# Patient Record
Sex: Male | Born: 1955 | Race: Black or African American | Hispanic: No | Marital: Single | Smoking: Current every day smoker
Health system: Southern US, Community
[De-identification: ages and names within clinical notes are randomized; demographics above are authoritative.]

## PROBLEM LIST (undated history)

## (undated) DIAGNOSIS — G4700 Insomnia, unspecified: Secondary | ICD-10-CM

## (undated) DIAGNOSIS — K219 Gastro-esophageal reflux disease without esophagitis: Secondary | ICD-10-CM

## (undated) DIAGNOSIS — Z22322 Carrier or suspected carrier of Methicillin resistant Staphylococcus aureus: Secondary | ICD-10-CM

## (undated) DIAGNOSIS — R51 Headache: Secondary | ICD-10-CM

## (undated) DIAGNOSIS — M1990 Unspecified osteoarthritis, unspecified site: Secondary | ICD-10-CM

## (undated) DIAGNOSIS — K5900 Constipation, unspecified: Secondary | ICD-10-CM

## (undated) DIAGNOSIS — Z9289 Personal history of other medical treatment: Secondary | ICD-10-CM

## (undated) DIAGNOSIS — I10 Essential (primary) hypertension: Secondary | ICD-10-CM

## (undated) DIAGNOSIS — R0600 Dyspnea, unspecified: Secondary | ICD-10-CM

## (undated) DIAGNOSIS — B370 Candidal stomatitis: Secondary | ICD-10-CM

## (undated) DIAGNOSIS — D61818 Other pancytopenia: Secondary | ICD-10-CM

## (undated) DIAGNOSIS — D539 Nutritional anemia, unspecified: Secondary | ICD-10-CM

## (undated) DIAGNOSIS — J449 Chronic obstructive pulmonary disease, unspecified: Secondary | ICD-10-CM

## (undated) DIAGNOSIS — C099 Malignant neoplasm of tonsil, unspecified: Secondary | ICD-10-CM

## (undated) DIAGNOSIS — R590 Localized enlarged lymph nodes: Secondary | ICD-10-CM

## (undated) DIAGNOSIS — C760 Malignant neoplasm of head, face and neck: Secondary | ICD-10-CM

## (undated) DIAGNOSIS — D649 Anemia, unspecified: Secondary | ICD-10-CM

## (undated) DIAGNOSIS — Z931 Gastrostomy status: Secondary | ICD-10-CM

## (undated) DIAGNOSIS — R569 Unspecified convulsions: Secondary | ICD-10-CM

## (undated) DIAGNOSIS — Z923 Personal history of irradiation: Secondary | ICD-10-CM

## (undated) DIAGNOSIS — R3911 Hesitancy of micturition: Secondary | ICD-10-CM

## (undated) DIAGNOSIS — M549 Dorsalgia, unspecified: Secondary | ICD-10-CM

## (undated) DIAGNOSIS — G8929 Other chronic pain: Secondary | ICD-10-CM

## (undated) DIAGNOSIS — K861 Other chronic pancreatitis: Secondary | ICD-10-CM

## (undated) DIAGNOSIS — I878 Other specified disorders of veins: Secondary | ICD-10-CM

## (undated) HISTORY — DX: Nutritional anemia, unspecified: D53.9

## (undated) HISTORY — DX: Unspecified osteoarthritis, unspecified site: M19.90

## (undated) HISTORY — DX: Other specified disorders of veins: I87.8

## (undated) HISTORY — PX: TIBIA FRACTURE SURGERY: SHX806

## (undated) HISTORY — PX: BILE DUCT STENT PLACEMENT: SHX1227

## (undated) HISTORY — DX: Anemia, unspecified: D64.9

## (undated) HISTORY — DX: Unspecified convulsions: R56.9

## (undated) HISTORY — DX: Personal history of irradiation: Z92.3

## (undated) HISTORY — DX: Candidal stomatitis: B37.0

## (undated) HISTORY — DX: Localized enlarged lymph nodes: R59.0

## (undated) HISTORY — DX: Other chronic pancreatitis: K86.1

---

## 2005-04-03 HISTORY — PX: CHOLECYSTECTOMY: SHX55

## 2008-06-17 ENCOUNTER — Inpatient Hospital Stay (HOSPITAL_COMMUNITY): Admission: EM | Admit: 2008-06-17 | Discharge: 2008-06-21 | Payer: Self-pay | Admitting: Emergency Medicine

## 2008-06-18 ENCOUNTER — Encounter: Payer: Self-pay | Admitting: Internal Medicine

## 2008-06-20 ENCOUNTER — Encounter (INDEPENDENT_AMBULATORY_CARE_PROVIDER_SITE_OTHER): Payer: Self-pay | Admitting: Gastroenterology

## 2008-07-01 ENCOUNTER — Emergency Department (HOSPITAL_COMMUNITY): Admission: EM | Admit: 2008-07-01 | Discharge: 2008-07-02 | Payer: Self-pay | Admitting: Emergency Medicine

## 2008-07-15 ENCOUNTER — Emergency Department (HOSPITAL_COMMUNITY): Admission: EM | Admit: 2008-07-15 | Discharge: 2008-07-15 | Payer: Self-pay | Admitting: Emergency Medicine

## 2008-07-24 ENCOUNTER — Inpatient Hospital Stay (HOSPITAL_COMMUNITY): Admission: EM | Admit: 2008-07-24 | Discharge: 2008-07-27 | Payer: Self-pay | Admitting: Emergency Medicine

## 2008-08-13 ENCOUNTER — Emergency Department (HOSPITAL_COMMUNITY): Admission: EM | Admit: 2008-08-13 | Discharge: 2008-08-13 | Payer: Self-pay | Admitting: Emergency Medicine

## 2008-09-18 ENCOUNTER — Emergency Department (HOSPITAL_COMMUNITY): Admission: EM | Admit: 2008-09-18 | Discharge: 2008-09-18 | Payer: Self-pay | Admitting: Emergency Medicine

## 2008-09-25 ENCOUNTER — Emergency Department (HOSPITAL_COMMUNITY): Admission: EM | Admit: 2008-09-25 | Discharge: 2008-09-25 | Payer: Self-pay | Admitting: Emergency Medicine

## 2008-10-17 ENCOUNTER — Encounter (INDEPENDENT_AMBULATORY_CARE_PROVIDER_SITE_OTHER): Payer: Self-pay | Admitting: Gastroenterology

## 2008-10-17 ENCOUNTER — Ambulatory Visit (HOSPITAL_COMMUNITY): Admission: RE | Admit: 2008-10-17 | Discharge: 2008-10-17 | Payer: Self-pay | Admitting: Gastroenterology

## 2008-10-18 ENCOUNTER — Ambulatory Visit (HOSPITAL_COMMUNITY): Admission: RE | Admit: 2008-10-18 | Discharge: 2008-10-18 | Payer: Self-pay | Admitting: Gastroenterology

## 2009-03-08 ENCOUNTER — Emergency Department (HOSPITAL_COMMUNITY): Admission: EM | Admit: 2009-03-08 | Discharge: 2009-03-09 | Payer: Self-pay | Admitting: Emergency Medicine

## 2009-03-28 ENCOUNTER — Inpatient Hospital Stay (HOSPITAL_COMMUNITY): Admission: EM | Admit: 2009-03-28 | Discharge: 2009-03-31 | Payer: Self-pay | Admitting: Emergency Medicine

## 2009-05-11 ENCOUNTER — Emergency Department (HOSPITAL_COMMUNITY): Admission: EM | Admit: 2009-05-11 | Discharge: 2009-05-11 | Payer: Self-pay | Admitting: Emergency Medicine

## 2009-05-15 ENCOUNTER — Emergency Department (HOSPITAL_COMMUNITY): Admission: EM | Admit: 2009-05-15 | Discharge: 2009-05-15 | Payer: Self-pay | Admitting: Emergency Medicine

## 2009-05-29 ENCOUNTER — Ambulatory Visit (HOSPITAL_COMMUNITY): Admission: RE | Admit: 2009-05-29 | Discharge: 2009-05-29 | Payer: Self-pay | Admitting: Gastroenterology

## 2009-05-29 ENCOUNTER — Encounter (INDEPENDENT_AMBULATORY_CARE_PROVIDER_SITE_OTHER): Payer: Self-pay | Admitting: Gastroenterology

## 2009-06-18 ENCOUNTER — Emergency Department (HOSPITAL_COMMUNITY): Admission: EM | Admit: 2009-06-18 | Discharge: 2009-06-18 | Payer: Self-pay | Admitting: Emergency Medicine

## 2009-07-11 ENCOUNTER — Emergency Department (HOSPITAL_COMMUNITY): Admission: EM | Admit: 2009-07-11 | Discharge: 2009-07-12 | Payer: Self-pay | Admitting: Emergency Medicine

## 2009-08-20 ENCOUNTER — Emergency Department (HOSPITAL_COMMUNITY): Admission: EM | Admit: 2009-08-20 | Discharge: 2009-08-20 | Payer: Self-pay | Admitting: Emergency Medicine

## 2009-09-04 ENCOUNTER — Ambulatory Visit (HOSPITAL_COMMUNITY): Admission: RE | Admit: 2009-09-04 | Discharge: 2009-09-04 | Payer: Self-pay | Admitting: Gastroenterology

## 2009-12-10 ENCOUNTER — Inpatient Hospital Stay (HOSPITAL_COMMUNITY): Admission: EM | Admit: 2009-12-10 | Discharge: 2009-12-14 | Payer: Self-pay | Admitting: Emergency Medicine

## 2010-02-20 ENCOUNTER — Emergency Department (HOSPITAL_COMMUNITY): Admission: EM | Admit: 2010-02-20 | Discharge: 2010-02-21 | Payer: Self-pay | Admitting: Emergency Medicine

## 2010-03-02 ENCOUNTER — Emergency Department (HOSPITAL_COMMUNITY): Admission: EM | Admit: 2010-03-02 | Discharge: 2010-03-03 | Payer: Self-pay | Admitting: Emergency Medicine

## 2010-03-19 ENCOUNTER — Emergency Department (HOSPITAL_COMMUNITY): Admission: EM | Admit: 2010-03-19 | Discharge: 2010-03-19 | Payer: Self-pay | Admitting: Emergency Medicine

## 2010-03-23 ENCOUNTER — Emergency Department (HOSPITAL_COMMUNITY): Admission: EM | Admit: 2010-03-23 | Discharge: 2010-03-23 | Payer: Self-pay | Admitting: Emergency Medicine

## 2010-04-21 ENCOUNTER — Emergency Department (HOSPITAL_COMMUNITY): Admission: EM | Admit: 2010-04-21 | Discharge: 2010-04-21 | Payer: Self-pay | Admitting: Emergency Medicine

## 2010-04-23 ENCOUNTER — Emergency Department (HOSPITAL_COMMUNITY): Admission: EM | Admit: 2010-04-23 | Discharge: 2010-04-23 | Payer: Self-pay | Admitting: Emergency Medicine

## 2010-05-18 ENCOUNTER — Emergency Department (HOSPITAL_COMMUNITY): Admission: EM | Admit: 2010-05-18 | Discharge: 2010-05-18 | Payer: Self-pay | Admitting: Emergency Medicine

## 2010-07-01 ENCOUNTER — Emergency Department (HOSPITAL_COMMUNITY): Admission: EM | Admit: 2010-07-01 | Discharge: 2010-07-01 | Payer: Self-pay | Admitting: Emergency Medicine

## 2010-07-03 ENCOUNTER — Emergency Department (HOSPITAL_COMMUNITY): Admission: EM | Admit: 2010-07-03 | Discharge: 2010-07-03 | Payer: Self-pay | Admitting: Emergency Medicine

## 2010-08-12 ENCOUNTER — Emergency Department (HOSPITAL_COMMUNITY): Admission: EM | Admit: 2010-08-12 | Discharge: 2010-08-12 | Payer: Self-pay | Admitting: Emergency Medicine

## 2010-10-13 ENCOUNTER — Emergency Department (HOSPITAL_COMMUNITY)
Admission: EM | Admit: 2010-10-13 | Discharge: 2010-10-14 | Payer: Self-pay | Source: Home / Self Care | Admitting: Emergency Medicine

## 2010-10-19 LAB — HEPATIC FUNCTION PANEL
ALT: 21 U/L (ref 0–53)
AST: 29 U/L (ref 0–37)
Albumin: 4.2 g/dL (ref 3.5–5.2)
Alkaline Phosphatase: 84 U/L (ref 39–117)
Bilirubin, Direct: 0.1 mg/dL (ref 0.0–0.3)
Indirect Bilirubin: 0.3 mg/dL (ref 0.3–0.9)
Total Bilirubin: 0.4 mg/dL (ref 0.3–1.2)
Total Protein: 7.8 g/dL (ref 6.0–8.3)

## 2010-10-19 LAB — BASIC METABOLIC PANEL
BUN: 9 mg/dL (ref 6–23)
CO2: 29 mEq/L (ref 19–32)
Calcium: 9.2 mg/dL (ref 8.4–10.5)
Chloride: 102 mEq/L (ref 96–112)
Creatinine, Ser: 0.84 mg/dL (ref 0.4–1.5)
GFR calc Af Amer: >60 mL/min (ref >60)
GFR calc non Af Amer: >60 mL/min (ref >60)
Glucose, Bld: 89 mg/dL (ref 70–99)
Potassium: 4.1 mEq/L (ref 3.5–5.1)
Sodium: 138 mEq/L (ref 135–145)

## 2010-10-19 LAB — URINALYSIS, ROUTINE W REFLEX MICROSCOPIC
Bilirubin Urine: NEGATIVE
Hgb urine dipstick: NEGATIVE
Ketones, ur: NEGATIVE mg/dL
Nitrite: NEGATIVE
Protein, ur: NEGATIVE mg/dL
Specific Gravity, Urine: 1.017 (ref 1.005–1.030)
Urine Glucose, Fasting: NEGATIVE mg/dL
Urobilinogen, UA: 0.2 mg/dL (ref 0.0–1.0)
pH: 6 (ref 5.0–8.0)

## 2010-10-19 LAB — CBC
HCT: 40.5 % (ref 39.0–52.0)
Hemoglobin: 13.5 g/dL (ref 13.0–17.0)
MCH: 32.3 pg (ref 26.0–34.0)
MCHC: 33.3 g/dL (ref 30.0–36.0)
MCV: 96.9 fL (ref 78.0–100.0)
Platelets: 164 10*3/uL (ref 150–400)
RBC: 4.18 MIL/uL — ABNORMAL LOW (ref 4.22–5.81)
RDW: 14.5 % (ref 11.5–15.5)
WBC: 5 10*3/uL (ref 4.0–10.5)

## 2010-10-19 LAB — LIPASE, BLOOD: Lipase: 37 U/L (ref 11–59)

## 2010-10-19 LAB — DIFFERENTIAL
Basophils Absolute: 0 10*3/uL (ref 0.0–0.1)
Basophils Relative: 0 % (ref 0–1)
Eosinophils Absolute: 0.2 10*3/uL (ref 0.0–0.7)
Eosinophils Relative: 3 % (ref 0–5)
Lymphocytes Relative: 47 % — ABNORMAL HIGH (ref 12–46)
Lymphs Abs: 2.3 10*3/uL (ref 0.7–4.0)
Monocytes Absolute: 0.8 10*3/uL (ref 0.1–1.0)
Monocytes Relative: 15 % — ABNORMAL HIGH (ref 3–12)
Neutro Abs: 1.7 10*3/uL (ref 1.7–7.7)
Neutrophils Relative %: 35 % — ABNORMAL LOW (ref 43–77)

## 2010-10-19 LAB — GLUCOSE, CAPILLARY: Glucose-Capillary: 78 mg/dL (ref 70–99)

## 2010-11-02 ENCOUNTER — Emergency Department (HOSPITAL_COMMUNITY)
Admission: EM | Admit: 2010-11-02 | Discharge: 2010-11-02 | Payer: Self-pay | Source: Home / Self Care | Admitting: Emergency Medicine

## 2010-11-02 LAB — COMPREHENSIVE METABOLIC PANEL
Albumin: 4.1 g/dL (ref 3.5–5.2)
BUN: 4 mg/dL — ABNORMAL LOW (ref 6–23)
CO2: 26 mEq/L (ref 19–32)
Calcium: 9 mg/dL (ref 8.4–10.5)
Chloride: 105 mEq/L (ref 96–112)
Creatinine, Ser: 0.64 mg/dL (ref 0.4–1.5)
GFR calc Af Amer: >60 mL/min (ref >60)
GFR calc non Af Amer: >60 mL/min (ref >60)
Total Bilirubin: 0.3 mg/dL (ref 0.3–1.2)

## 2010-11-02 LAB — DIFFERENTIAL
Basophils Relative: 0 % (ref 0–1)
Eosinophils Absolute: 0.1 10*3/uL (ref 0.0–0.7)
Lymphs Abs: 1.8 10*3/uL (ref 0.7–4.0)
Monocytes Absolute: 0.7 10*3/uL (ref 0.1–1.0)
Monocytes Relative: 13 % — ABNORMAL HIGH (ref 3–12)
Neutrophils Relative %: 51 % (ref 43–77)

## 2010-11-02 LAB — CBC
MCH: 30.6 pg (ref 26.0–34.0)
MCHC: 32.4 g/dL (ref 30.0–36.0)
MCV: 94.5 fL (ref 78.0–100.0)
Platelets: 197 10*3/uL (ref 150–400)
RBC: 3.99 MIL/uL — ABNORMAL LOW (ref 4.22–5.81)

## 2010-12-15 DIAGNOSIS — C801 Malignant (primary) neoplasm, unspecified: Secondary | ICD-10-CM | POA: Insufficient documentation

## 2010-12-15 DIAGNOSIS — C099 Malignant neoplasm of tonsil, unspecified: Secondary | ICD-10-CM

## 2010-12-15 HISTORY — DX: Malignant neoplasm of tonsil, unspecified: C09.9

## 2010-12-15 LAB — DIFFERENTIAL
Basophils Absolute: 0.1 10*3/uL (ref 0.0–0.1)
Basophils Relative: 1 % (ref 0–1)
Eosinophils Absolute: 0.1 10*3/uL (ref 0.0–0.7)
Eosinophils Relative: 1 % (ref 0–5)
Lymphocytes Relative: 43 % (ref 12–46)
Monocytes Absolute: 0.8 10*3/uL (ref 0.1–1.0)

## 2010-12-15 LAB — CBC
Hemoglobin: 14.1 g/dL (ref 13.0–17.0)
Platelets: 204 10*3/uL (ref 150–400)
RBC: 4.35 MIL/uL (ref 4.22–5.81)
WBC: 5.2 10*3/uL (ref 4.0–10.5)

## 2010-12-15 LAB — COMPREHENSIVE METABOLIC PANEL
ALT: 26 U/L (ref 0–53)
AST: 37 U/L (ref 0–37)
Albumin: 4.4 g/dL (ref 3.5–5.2)
Alkaline Phosphatase: 85 U/L (ref 39–117)
CO2: 27 mEq/L (ref 19–32)
Chloride: 103 mEq/L (ref 96–112)
GFR calc Af Amer: >60 mL/min (ref >60)
GFR calc non Af Amer: >60 mL/min (ref >60)
Potassium: 3.7 mEq/L (ref 3.5–5.1)
Total Bilirubin: 0.3 mg/dL (ref 0.3–1.2)

## 2010-12-17 LAB — DIFFERENTIAL
Basophils Relative: 1 % (ref 0–1)
Eosinophils Absolute: 0.1 10*3/uL (ref 0.0–0.7)
Lymphs Abs: 1.3 10*3/uL (ref 0.7–4.0)
Neutrophils Relative %: 51 % (ref 43–77)

## 2010-12-17 LAB — COMPREHENSIVE METABOLIC PANEL
ALT: 25 U/L (ref 0–53)
Alkaline Phosphatase: 87 U/L (ref 39–117)
BUN: 6 mg/dL (ref 6–23)
CO2: 25 mEq/L (ref 19–32)
Calcium: 9.2 mg/dL (ref 8.4–10.5)
GFR calc non Af Amer: >60 mL/min (ref >60)
Glucose, Bld: 91 mg/dL (ref 70–99)
Sodium: 140 mEq/L (ref 135–145)

## 2010-12-17 LAB — CBC
Hemoglobin: 11.9 g/dL — ABNORMAL LOW (ref 13.0–17.0)
MCH: 32.8 pg (ref 26.0–34.0)
MCHC: 33.3 g/dL (ref 30.0–36.0)
RDW: 15.8 % — ABNORMAL HIGH (ref 11.5–15.5)

## 2010-12-17 LAB — CK TOTAL AND CKMB (NOT AT ARMC): Relative Index: INVALID (ref 0.0–2.5)

## 2010-12-17 LAB — TROPONIN I: Troponin I: 0.01 ng/mL (ref 0.00–0.06)

## 2010-12-17 LAB — LIPASE, BLOOD: Lipase: 26 U/L (ref 11–59)

## 2010-12-18 LAB — DIFFERENTIAL
Basophils Absolute: 0 10*3/uL (ref 0.0–0.1)
Eosinophils Relative: 5 % (ref 0–5)
Lymphocytes Relative: 21 % (ref 12–46)
Neutro Abs: 3.1 10*3/uL (ref 1.7–7.7)

## 2010-12-18 LAB — CBC
Hemoglobin: 10.8 g/dL — ABNORMAL LOW (ref 13.0–17.0)
MCH: 32.6 pg (ref 26.0–34.0)
MCV: 96.6 fL (ref 78.0–100.0)
RBC: 3.32 MIL/uL — ABNORMAL LOW (ref 4.22–5.81)

## 2010-12-18 LAB — COMPREHENSIVE METABOLIC PANEL
BUN: 4 mg/dL — ABNORMAL LOW (ref 6–23)
CO2: 27 mEq/L (ref 19–32)
Chloride: 105 mEq/L (ref 96–112)
Creatinine, Ser: 0.52 mg/dL (ref 0.4–1.5)
GFR calc non Af Amer: >60 mL/min (ref >60)
Total Bilirubin: 0.4 mg/dL (ref 0.3–1.2)

## 2010-12-18 LAB — LIPASE, BLOOD: Lipase: 30 U/L (ref 11–59)

## 2010-12-20 LAB — DIFFERENTIAL
Basophils Absolute: 0 10*3/uL (ref 0.0–0.1)
Basophils Relative: 0 % (ref 0–1)
Eosinophils Relative: 1 % (ref 0–5)
Monocytes Absolute: 0.5 10*3/uL (ref 0.1–1.0)
Neutro Abs: 2.5 10*3/uL (ref 1.7–7.7)

## 2010-12-20 LAB — COMPREHENSIVE METABOLIC PANEL
AST: 31 U/L (ref 0–37)
Albumin: 4.9 g/dL (ref 3.5–5.2)
Alkaline Phosphatase: 121 U/L — ABNORMAL HIGH (ref 39–117)
BUN: 5 mg/dL — ABNORMAL LOW (ref 6–23)
CO2: 25 mEq/L (ref 19–32)
Chloride: 100 mEq/L (ref 96–112)
Potassium: 4.8 mEq/L (ref 3.5–5.1)
Total Bilirubin: 0.9 mg/dL (ref 0.3–1.2)

## 2010-12-20 LAB — URINALYSIS, ROUTINE W REFLEX MICROSCOPIC
Bilirubin Urine: NEGATIVE
Hgb urine dipstick: NEGATIVE
Protein, ur: NEGATIVE mg/dL
Urobilinogen, UA: 1 mg/dL (ref 0.0–1.0)

## 2010-12-20 LAB — CBC
Hemoglobin: 15.6 g/dL (ref 13.0–17.0)
RDW: 14.1 % (ref 11.5–15.5)

## 2010-12-21 LAB — DIFFERENTIAL
Basophils Absolute: 0 10*3/uL (ref 0.0–0.1)
Basophils Absolute: 0 10*3/uL (ref 0.0–0.1)
Basophils Relative: 0 % (ref 0–1)
Basophils Relative: 0 % (ref 0–1)
Eosinophils Absolute: 0 10*3/uL (ref 0.0–0.7)
Eosinophils Absolute: 0 10*3/uL (ref 0.0–0.7)
Eosinophils Relative: 0 % (ref 0–5)
Lymphocytes Relative: 10 % — ABNORMAL LOW (ref 12–46)
Monocytes Absolute: 0.6 10*3/uL (ref 0.1–1.0)
Monocytes Relative: 8 % (ref 3–12)
Neutro Abs: 6.1 10*3/uL (ref 1.7–7.7)
Neutro Abs: 7.4 10*3/uL (ref 1.7–7.7)
Neutrophils Relative %: 82 % — ABNORMAL HIGH (ref 43–77)

## 2010-12-21 LAB — CBC
HCT: 43.1 % (ref 39.0–52.0)
HCT: 47.1 % (ref 39.0–52.0)
Hemoglobin: 14.1 g/dL (ref 13.0–17.0)
MCHC: 33 g/dL (ref 30.0–36.0)
MCV: 98.9 fL (ref 78.0–100.0)
Platelets: 186 10*3/uL (ref 150–400)
RDW: 14.1 % (ref 11.5–15.5)
WBC: 7.4 10*3/uL (ref 4.0–10.5)

## 2010-12-21 LAB — URINALYSIS, ROUTINE W REFLEX MICROSCOPIC
Bilirubin Urine: NEGATIVE
Glucose, UA: NEGATIVE mg/dL
Hgb urine dipstick: NEGATIVE
Ketones, ur: 40 mg/dL — AB
Protein, ur: NEGATIVE mg/dL
Urobilinogen, UA: 1 mg/dL (ref 0.0–1.0)
pH: 6 (ref 5.0–8.0)

## 2010-12-21 LAB — COMPREHENSIVE METABOLIC PANEL
ALT: 9 U/L (ref 0–53)
AST: 23 U/L (ref 0–37)
Albumin: 4.2 g/dL (ref 3.5–5.2)
Albumin: 4.7 g/dL (ref 3.5–5.2)
Alkaline Phosphatase: 114 U/L (ref 39–117)
BUN: 5 mg/dL — ABNORMAL LOW (ref 6–23)
BUN: 8 mg/dL (ref 6–23)
Calcium: 9.6 mg/dL (ref 8.4–10.5)
Chloride: 104 mEq/L (ref 96–112)
Creatinine, Ser: 0.68 mg/dL (ref 0.4–1.5)
GFR calc Af Amer: >60 mL/min (ref >60)
Potassium: 3.7 mEq/L (ref 3.5–5.1)
Total Bilirubin: 0.7 mg/dL (ref 0.3–1.2)

## 2010-12-22 ENCOUNTER — Other Ambulatory Visit (HOSPITAL_COMMUNITY): Payer: Self-pay | Admitting: Otolaryngology

## 2010-12-28 ENCOUNTER — Other Ambulatory Visit: Payer: Self-pay | Admitting: Oncology

## 2010-12-28 ENCOUNTER — Encounter: Payer: Self-pay | Admitting: Oncology

## 2010-12-28 ENCOUNTER — Other Ambulatory Visit (HOSPITAL_COMMUNITY): Payer: Medicaid Other | Admitting: Dentistry

## 2010-12-28 ENCOUNTER — Encounter (HOSPITAL_BASED_OUTPATIENT_CLINIC_OR_DEPARTMENT_OTHER): Payer: Medicaid Other | Admitting: Oncology

## 2010-12-28 DIAGNOSIS — K045 Chronic apical periodontitis: Secondary | ICD-10-CM

## 2010-12-28 DIAGNOSIS — K0401 Reversible pulpitis: Secondary | ICD-10-CM

## 2010-12-28 DIAGNOSIS — C099 Malignant neoplasm of tonsil, unspecified: Secondary | ICD-10-CM

## 2010-12-28 DIAGNOSIS — K083 Retained dental root: Secondary | ICD-10-CM

## 2010-12-28 DIAGNOSIS — K0530 Chronic periodontitis, unspecified: Secondary | ICD-10-CM

## 2010-12-28 LAB — COMPREHENSIVE METABOLIC PANEL
ALT: 10 U/L (ref 0–53)
ALT: 11 U/L (ref 0–53)
AST: 15 U/L (ref 0–37)
AST: 23 U/L (ref 0–37)
Albumin: 4 g/dL (ref 3.5–5.2)
Alkaline Phosphatase: 126 U/L — ABNORMAL HIGH (ref 39–117)
BUN: 5 mg/dL — ABNORMAL LOW (ref 6–23)
CO2: 24 mEq/L (ref 19–32)
CO2: 27 mEq/L (ref 19–32)
CO2: 30 mEq/L (ref 19–32)
Calcium: 9 mg/dL (ref 8.4–10.5)
Calcium: 9.5 mg/dL (ref 8.4–10.5)
Calcium: 8.9 mg/dL (ref 8.4–10.5)
Chloride: 101 mEq/L (ref 96–112)
Creatinine, Ser: 0.59 mg/dL (ref 0.4–1.5)
GFR calc Af Amer: >60 mL/min (ref >60)
GFR calc Af Amer: >60 mL/min (ref >60)
GFR calc non Af Amer: >60 mL/min (ref >60)
GFR calc non Af Amer: >60 mL/min (ref >60)
Glucose, Bld: 109 mg/dL — ABNORMAL HIGH (ref 70–99)
Glucose, Bld: 110 mg/dL — ABNORMAL HIGH (ref 70–99)
Potassium: 3.7 mEq/L (ref 3.5–5.3)
Sodium: 137 mEq/L (ref 135–145)
Sodium: 140 mEq/L (ref 135–145)
Sodium: 140 mEq/L (ref 135–145)
Total Bilirubin: 1 mg/dL (ref 0.3–1.2)
Total Protein: 7 g/dL (ref 6.0–8.3)
Total Protein: 7.8 g/dL (ref 6.0–8.3)

## 2010-12-28 LAB — CBC WITH DIFFERENTIAL/PLATELET
Basophils Absolute: 0 10*3/uL (ref 0.0–0.1)
Eosinophils Absolute: 0.1 10*3/uL (ref 0.0–0.5)
HCT: 35.4 % — ABNORMAL LOW (ref 38.4–49.9)
HGB: 11.8 g/dL — ABNORMAL LOW (ref 13.0–17.1)
MCV: 94.1 fL (ref 79.3–98.0)
MONO%: 15.2 % — ABNORMAL HIGH (ref 0.0–14.0)
NEUT#: 2.2 10*3/uL (ref 1.5–6.5)
NEUT%: 40.6 % (ref 39.0–75.0)
RDW: 14.5 % (ref 11.0–14.6)
lymph#: 2.3 10*3/uL (ref 0.9–3.3)

## 2010-12-28 LAB — DIFFERENTIAL
Basophils Absolute: 0 10*3/uL (ref 0.0–0.1)
Basophils Relative: 0 % (ref 0–1)
Eosinophils Absolute: 0 10*3/uL (ref 0.0–0.7)
Eosinophils Relative: 0 % (ref 0–5)
Neutrophils Relative %: 80 % — ABNORMAL HIGH (ref 43–77)

## 2010-12-28 LAB — CBC
Hemoglobin: 14.3 g/dL (ref 13.0–17.0)
MCHC: 34.1 g/dL (ref 30.0–36.0)
RBC: 4.28 MIL/uL (ref 4.22–5.81)
WBC: 8.3 10*3/uL (ref 4.0–10.5)

## 2010-12-28 LAB — BASIC METABOLIC PANEL
BUN: 6 mg/dL (ref 6–23)
Calcium: 9.2 mg/dL (ref 8.4–10.5)
Chloride: 104 mEq/L (ref 96–112)
Creatinine, Ser: 0.68 mg/dL (ref 0.4–1.5)
GFR calc Af Amer: >60 mL/min (ref >60)
GFR calc non Af Amer: >60 mL/min (ref >60)

## 2010-12-28 LAB — LIPASE, BLOOD: Lipase: 40 U/L (ref 11–59)

## 2010-12-29 ENCOUNTER — Other Ambulatory Visit (HOSPITAL_COMMUNITY): Payer: Self-pay

## 2010-12-29 ENCOUNTER — Other Ambulatory Visit: Payer: Self-pay | Admitting: Oncology

## 2010-12-29 DIAGNOSIS — C099 Malignant neoplasm of tonsil, unspecified: Secondary | ICD-10-CM

## 2010-12-30 ENCOUNTER — Encounter (HOSPITAL_COMMUNITY): Payer: Medicaid Other

## 2010-12-30 ENCOUNTER — Other Ambulatory Visit: Payer: Self-pay | Admitting: Oncology

## 2010-12-30 DIAGNOSIS — C099 Malignant neoplasm of tonsil, unspecified: Secondary | ICD-10-CM

## 2010-12-31 ENCOUNTER — Ambulatory Visit (HOSPITAL_COMMUNITY)
Admission: RE | Admit: 2010-12-31 | Discharge: 2010-12-31 | Disposition: A | Discharge status: Home / Self Care | Payer: Medicaid Other | Source: Ambulatory Visit | Attending: Dentistry | Admitting: Dentistry

## 2010-12-31 DIAGNOSIS — K045 Chronic apical periodontitis: Secondary | ICD-10-CM

## 2010-12-31 DIAGNOSIS — M278 Other specified diseases of jaws: Secondary | ICD-10-CM

## 2010-12-31 DIAGNOSIS — K083 Retained dental root: Secondary | ICD-10-CM | POA: Insufficient documentation

## 2010-12-31 DIAGNOSIS — C099 Malignant neoplasm of tonsil, unspecified: Secondary | ICD-10-CM | POA: Insufficient documentation

## 2010-12-31 DIAGNOSIS — K0530 Chronic periodontitis, unspecified: Secondary | ICD-10-CM

## 2011-01-01 NOTE — Op Note (Signed)
NAME:  Russell Williamson, Russell Williamson            ACCOUNT NO.:  [id number]  MEDICAL RECORD NO.:  [id number]           PATIENT TYPE:  O  LOCATION:  DAYL                         FACILITY:  [facility]  PHYSICIAN:  Charlynne Pander, D.D.S.DATE OF BIRTH:  15-Feb-1956  DATE OF PROCEDURE:  12/31/2010 DATE OF DISCHARGE:                              OPERATIVE REPORT   PREOPERATIVE DIAGNOSES: 1. Squamous cell carcinoma of left tonsil. 2. Pre-chemoradiation therapy dental protocol. 3. Chronic periodontitis. 4. Apical periodontitis. 5. Bilateral mandibular lingual tori. 6. Retained roots.  POSTOPERATIVE DIAGNOSES: 1. Squamous cell carcinoma of left tonsil. 2. Pre-chemoradiation therapy dental protocol. 3. Chronic periodontitis. 4. Apical periodontitis. 5. Bilateral mandibular lingual tori. 6. Retained roots. 7. Lateral exostoses.  OPERATIONS: 1. Extraction of remaining teeth (tooth numbers 1, 3, 5, 6, 7, 9, 10,     11, 12, 13, 14, 16, 17, 19, 20, 21, 22, 23, 24, 25, 26, 27, 28, 29,     31, and 32). 2. Four quadrants of alveoloplasty. 3. Bilateral mandibular lingual tori reductions. 4. Upper right quadrant lateral exostoses reductions.  SURGEON:  Charlynne Pander, D.D.S.  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  ANESTHESIA:  General anesthesia via nasoendotracheal tube.  MEDICATIONS: 1. 1 g of Ancef IV prior to invasive dental procedures. 2. Local anesthesia with a total utilization of 5 carpules each     containing 34 mg of lidocaine with 0.017 mg of epinephrine as well     as 2 carpules each containing 9 mg of bupivacaine with 0.009 mg of     epinephrine.  SPECIMENS:  There were 26 teeth that were discarded.  DRAINS:  None.  CULTURES:  None.  COMPLICATIONS:  None.  ESTIMATED BLOOD LOSS:  150 mL.  FLUIDS:  Per the anesthesia team records.  INDICATIONS:  The patient was recently diagnosed with squamous cell carcinoma of the left tonsil.  The patient with  anticipated chemoradiation therapy.  The patient was seen as part of a pre- chemoradiation therapy dental protocol evaluation.  The patient was examined and treatment planned for extraction of remaining teeth with alveoloplasty and pre-prosthetic surgery was indicated.  This treatment plan was formulated to decrease the risk and complications associated with dental infection from affecting the patient's systemic health as well as to prevent future complication of dental infection and osteoradionecrosis.  OPERATIVE FINDINGS:  The patient was examined in the operating room #6. The teeth were identified for extraction.  The patient noted to be affected by chronic periodontitis, apical periodontitis, multiple retained root segments, bilateral mandibular lingual tori, and the presence of lateral exostoses.  DESCRIPTION OF PROCEDURE:  The patient was brought to the main operating room #6.  The patient was then placed in a supine position on the operating room table.  General anesthesia was then induced via nasoendotracheal tube.  The patient was then prepped and draped in usual manner for dental medicine procedure.  A time-out was performed and the patient was identified, procedures were verified.  Throat pack was placed at this time.  The oral cavity was then thoroughly examined with findings were noted above.  The patient was then ready for the  dental medicine procedure as follows:  Local anesthesia was administered sequentially with a total utilization of 5 carpules each containing 34 mg of lidocaine with 0.017 mg of epinephrine as well as 2 carpules each containing 9 mg of bupivacaine with 0.009 mg of epinephrine.  The maxillary left and right quadrants first approached.  Anesthesia delivered via infiltration utilizing lidocaine with epinephrine.  At this point in time, the maxillary right quadrant was approached, 15 blade incision was made from the maxillary right tuberosity and  extended to the maxillary left tuberosity.  A surgical flap was then carefully reflected.  Appropriate amounts of buccal and interseptal bone were removed with a surgical handpiece and bur and copious amounts of sterile saline.  The teeth were then subluxated with a series of straight elevators.  Tooth numbers 1 and the coronal aspect of 3 were then removed with a 53R forceps leaving the roots of tooth #3 remaining. Further bone was then removed as indicated and the roots were then elevated out and removed with a 150 forceps without further complication.  Tooth numbers 5, 6, 7, retained root #9, Tooth numbers 10, 11, 12, and 13 were then removed with a 150 forceps without complications.  Tooth #14 and #16 were then removed with a 53R forceps without complications.  Alveoplasty was then performed utilizing rongeurs and bone file.  At this point in time, flap was then further reflected towards the buccal in the upper right quadrant to expose the lateral exostoses.  These exostoses on the buccal aspect were then removed with a surgical handpiece and bur and copious amounts of sterile saline.  Further alveoloplasty was then performed as indicated.  The tissues were approximated and trimmed appropriately.  The surgical sites were then irrigated with copious amounts of sterile saline x4.  The maxillary right surgical site was then closed from the maxillary right tuberosity and extended to the mesial #8 utilizing 3-0 chromic gut suture in a continuous interrupted suture technique x1.  The maxillary left surgical site was then closed from the maxillary left tuberosity and extended to the mesial of #9 utilizing 3-0 chromic gut suture in a continuous interrupted suture technique x1.  One individual interrupted suture was then placed further to close the surgical site.  At this point in time, the mandibular quadrants were approached.  The patient was given bilateral inferior alveolar nerve blocks  and bilateral long buccal nerve blocks utilizing the bupivacaine with epinephrine. Further infiltration was then achieved on the buccal and lingual aspects utilizing the lidocaine with epinephrine.  At this point in time, the mandibular right quadrant was approached.  A 15 blade incision was made from distal #32 and extended to the mesial #17.  Surgical flap was then carefully reflected.  Appropriate amounts of buccal and interseptal bone were removed utilizing a surgical handpiece and bur and copious amounts of sterile saline.  At this point in time, the coronal aspect of tooth #32 was removed with a 17 forceps leaving the roots remaining.  The roots were then removed after further bone was removed, with a 151 forceps.  Tooth #31 was then removed with a 23 forceps without complications.  Tooth numbers 29, 28, 27, 26, 25, 24, 23, 22, retained root #21 were then removed with a 151 forceps without complications.  The coronal aspect of tooth #19 was then removed with a 23 forceps leaving the roots remaining.  Further bone was then removed appropriately and the roots were elevated out with a series of Cryers  elevators.  Tooth #17 was then approached and removed with a 17 forceps without complications.  At this point in time, alveoloplasty was then performed utilizing rongeurs and bone file.  The flaps were then further reflected along the entire lingual aspect of the mandibular left and mandibular right quadrants to expose the mandibular tori and lateral exostoses.  These were then removed with a surgical handpiece and bur and copious amounts of sterile saline.  Further alveoloplasty was then performed utilizing rongeurs and bone file.  The surgical sites were then irrigated with copious amounts of sterile saline x4.  The tissues were approximated and trimmed appropriately.  At this point in time, the mandibular left surgical site was then closed from the distal #17 and extended to the  mesial #24 utilizing 3-0 chromic gut suture in a continuous interrupted suture technique x1.  The mandibular right quadrant was then closed from distal #32 and extended to mesial of #25 utilizing 3-0 chromic gut suture in a continuous interrupted suture technique x1.  At this point in time, the entire mouth was irrigated with copious amounts of sterile saline.  The patient was examined for complications, seeing none, dental medicine procedure deemed to be complete.  The throat pack was removed at this time.  A series of 4 x 4 gauzes were then placed in the mouth to aid hemostasis.  An oral airway was then placed at the request of the anesthesia team.  The patient was then handed to the anesthesia team for final disposition.  While awaiting to be extubated, the patient was given 30 mg of Toradol IV to assist in pain control.  The patient was then extubated at the appropriate time, taken to the postanesthesia care unit with stable vital signs, good oxygenation level.  All counts were correct for dental medicine procedure.  The patient will be seen in approximately 1 week for evaluation for suture removal.  The patient does have appropriate pain medication that was previously prescribed by his medical doctors.     Charlynne Pander, D.D.S.     RFK/MEDQ  D:  12/31/2010  T:  01/01/2011  Job:  [id number]  cc:   Radene Gunning, M.D., Ph.D. Fax: [phone] Kristine Garbe. Ezzard Standing, M.D. Fax: [phone]  Jethro Bolus, M.D.  Electronically Signed by Cindra Eves D.D.S. on 01/01/2011 08:39:27 AM

## 2011-01-05 ENCOUNTER — Encounter (HOSPITAL_COMMUNITY)
Admission: RE | Admit: 2011-01-05 | Discharge: 2011-01-05 | Disposition: A | Discharge status: Home / Self Care | Payer: Medicaid Other | Source: Ambulatory Visit | Attending: Otolaryngology | Admitting: Otolaryngology

## 2011-01-05 ENCOUNTER — Encounter (HOSPITAL_COMMUNITY)
Admission: RE | Admit: 2011-01-05 | Discharge: 2011-01-05 | Disposition: A | Discharge status: Home / Self Care | Payer: Medicaid Other | Source: Ambulatory Visit | Attending: Oncology | Admitting: Oncology

## 2011-01-05 ENCOUNTER — Encounter (HOSPITAL_COMMUNITY): Payer: Self-pay

## 2011-01-05 DIAGNOSIS — C099 Malignant neoplasm of tonsil, unspecified: Secondary | ICD-10-CM

## 2011-01-05 HISTORY — DX: Malignant neoplasm of tonsil, unspecified: C09.9

## 2011-01-05 HISTORY — DX: Essential (primary) hypertension: I10

## 2011-01-05 MED ORDER — FLUDEOXYGLUCOSE F - 18 (FDG) INJECTION: 16.5000 | Freq: Once | INTRAVENOUS | Status: DC | PRN

## 2011-01-05 MED ORDER — IOHEXOL 300 MG/ML  SOLN
100.0000 mL | Freq: Once | INTRAMUSCULAR | Status: AC | PRN
  Administered 2011-01-05: 100 mL via INTRAVENOUS

## 2011-01-06 ENCOUNTER — Ambulatory Visit: Payer: Medicaid Other | Attending: Radiation Oncology | Admitting: Radiation Oncology

## 2011-01-06 DIAGNOSIS — K117 Disturbances of salivary secretion: Secondary | ICD-10-CM | POA: Insufficient documentation

## 2011-01-06 DIAGNOSIS — K121 Other forms of stomatitis: Secondary | ICD-10-CM | POA: Insufficient documentation

## 2011-01-06 DIAGNOSIS — Z510 Encounter for antineoplastic radiation therapy: Secondary | ICD-10-CM | POA: Insufficient documentation

## 2011-01-06 DIAGNOSIS — D61818 Other pancytopenia: Secondary | ICD-10-CM | POA: Insufficient documentation

## 2011-01-06 DIAGNOSIS — C099 Malignant neoplasm of tonsil, unspecified: Secondary | ICD-10-CM | POA: Insufficient documentation

## 2011-01-07 LAB — CBC
HCT: 36.2 % — ABNORMAL LOW (ref 39.0–52.0)
Hemoglobin: 12.1 g/dL — ABNORMAL LOW (ref 13.0–17.0)
MCHC: 33.4 g/dL (ref 30.0–36.0)
MCV: 97.3 fL (ref 78.0–100.0)
Platelets: 226 10*3/uL (ref 150–400)
RBC: 3.72 MIL/uL — ABNORMAL LOW (ref 4.22–5.81)
RDW: 15.9 % — ABNORMAL HIGH (ref 11.5–15.5)
WBC: 5.6 K/uL (ref 4.0–10.5)

## 2011-01-07 LAB — DIFFERENTIAL
Basophils Absolute: 0.1 K/uL (ref 0.0–0.1)
Basophils Relative: 1 % (ref 0–1)
Eosinophils Absolute: 0.2 K/uL (ref 0.0–0.7)
Eosinophils Relative: 3 % (ref 0–5)
Lymphocytes Relative: 36 % (ref 12–46)
Lymphs Abs: 2 10*3/uL (ref 0.7–4.0)
Monocytes Absolute: 1 K/uL (ref 0.1–1.0)
Monocytes Relative: 18 % — ABNORMAL HIGH (ref 3–12)
Neutro Abs: 2.3 10*3/uL (ref 1.7–7.7)
Neutrophils Relative %: 41 % — ABNORMAL LOW (ref 43–77)

## 2011-01-07 LAB — COMPREHENSIVE METABOLIC PANEL WITH GFR
ALT: 17 U/L (ref 0–53)
AST: 23 U/L (ref 0–37)
Alkaline Phosphatase: 92 U/L (ref 39–117)
CO2: 24 meq/L (ref 19–32)
Chloride: 102 meq/L (ref 96–112)
GFR calc Af Amer: >60 mL/min (ref >60)
GFR calc non Af Amer: >60 mL/min (ref >60)
Potassium: 3.4 meq/L — ABNORMAL LOW (ref 3.5–5.1)
Sodium: 133 meq/L — ABNORMAL LOW (ref 135–145)
Total Bilirubin: 0.3 mg/dL (ref 0.3–1.2)

## 2011-01-07 LAB — COMPREHENSIVE METABOLIC PANEL
Albumin: 4 g/dL (ref 3.5–5.2)
BUN: 6 mg/dL (ref 6–23)
Calcium: 9.2 mg/dL (ref 8.4–10.5)
Creatinine, Ser: 0.81 mg/dL (ref 0.4–1.5)
Glucose, Bld: 85 mg/dL (ref 70–99)
Total Protein: 7.3 g/dL (ref 6.0–8.3)

## 2011-01-07 LAB — LIPASE, BLOOD: Lipase: 65 U/L — ABNORMAL HIGH (ref 11–59)

## 2011-01-08 LAB — COMPREHENSIVE METABOLIC PANEL
AST: 22 U/L (ref 0–37)
CO2: 25 mEq/L (ref 19–32)
Chloride: 103 mEq/L (ref 96–112)
Creatinine, Ser: 0.72 mg/dL (ref 0.4–1.5)
GFR calc Af Amer: >60 mL/min (ref >60)
GFR calc non Af Amer: >60 mL/min (ref >60)
Glucose, Bld: 101 mg/dL — ABNORMAL HIGH (ref 70–99)
Total Bilirubin: 0.5 mg/dL (ref 0.3–1.2)

## 2011-01-08 LAB — CBC
HCT: 39.6 % (ref 39.0–52.0)
Hemoglobin: 13.3 g/dL (ref 13.0–17.0)
MCV: 96.6 fL (ref 78.0–100.0)
RBC: 4.1 MIL/uL — ABNORMAL LOW (ref 4.22–5.81)
WBC: 6 10*3/uL (ref 4.0–10.5)

## 2011-01-08 LAB — DIFFERENTIAL
Basophils Absolute: 0 10*3/uL (ref 0.0–0.1)
Eosinophils Absolute: 0.2 10*3/uL (ref 0.0–0.7)
Eosinophils Relative: 3 % (ref 0–5)
Lymphocytes Relative: 19 % (ref 12–46)

## 2011-01-08 LAB — LIPASE, BLOOD: Lipase: 18 U/L (ref 11–59)

## 2011-01-09 LAB — URINALYSIS, ROUTINE W REFLEX MICROSCOPIC
Bilirubin Urine: NEGATIVE
Glucose, UA: NEGATIVE mg/dL
Hgb urine dipstick: NEGATIVE
Ketones, ur: NEGATIVE mg/dL
Protein, ur: NEGATIVE mg/dL
Urobilinogen, UA: 0.2 mg/dL (ref 0.0–1.0)

## 2011-01-09 LAB — COMPREHENSIVE METABOLIC PANEL
ALT: 11 U/L (ref 0–53)
ALT: 10 U/L (ref 0–53)
AST: 22 U/L (ref 0–37)
Albumin: 3.9 g/dL (ref 3.5–5.2)
Alkaline Phosphatase: 115 U/L (ref 39–117)
BUN: 4 mg/dL — ABNORMAL LOW (ref 6–23)
Calcium: 8.9 mg/dL (ref 8.4–10.5)
Chloride: 104 mEq/L (ref 96–112)
GFR calc Af Amer: >60 mL/min (ref >60)
Potassium: 3.3 mEq/L — ABNORMAL LOW (ref 3.5–5.1)
Sodium: 139 mEq/L (ref 135–145)
Sodium: 136 mEq/L (ref 135–145)
Total Bilirubin: 0.5 mg/dL (ref 0.3–1.2)
Total Protein: 6.7 g/dL (ref 6.0–8.3)

## 2011-01-09 LAB — DIFFERENTIAL
Basophils Absolute: 0 10*3/uL (ref 0.0–0.1)
Basophils Relative: 0 % (ref 0–1)
Eosinophils Absolute: 0.2 10*3/uL (ref 0.0–0.7)
Eosinophils Absolute: 0.1 10*3/uL (ref 0.0–0.7)
Eosinophils Relative: 4 % (ref 0–5)
Eosinophils Relative: 3 % (ref 0–5)
Lymphs Abs: 1.8 10*3/uL (ref 0.7–4.0)
Monocytes Absolute: 0.6 10*3/uL (ref 0.1–1.0)
Monocytes Relative: 15 % — ABNORMAL HIGH (ref 3–12)
Neutro Abs: 2.2 10*3/uL (ref 1.7–7.7)

## 2011-01-09 LAB — CBC
HCT: 36.7 % — ABNORMAL LOW (ref 39.0–52.0)
Hemoglobin: 12.3 g/dL — ABNORMAL LOW (ref 13.0–17.0)
MCHC: 33.2 g/dL (ref 30.0–36.0)
Platelets: 190 10*3/uL (ref 150–400)
RDW: 14.9 % (ref 11.5–15.5)
WBC: 4.3 10*3/uL (ref 4.0–10.5)

## 2011-01-09 LAB — PROTIME-INR: INR: 1.1 (ref 0.00–1.49)

## 2011-01-09 LAB — TYPE AND SCREEN

## 2011-01-10 LAB — URINALYSIS, ROUTINE W REFLEX MICROSCOPIC
Bilirubin Urine: NEGATIVE
Glucose, UA: NEGATIVE mg/dL
Hgb urine dipstick: NEGATIVE
Protein, ur: NEGATIVE mg/dL

## 2011-01-10 LAB — COMPREHENSIVE METABOLIC PANEL
AST: 21 U/L (ref 0–37)
Albumin: 3.8 g/dL (ref 3.5–5.2)
BUN: 5 mg/dL — ABNORMAL LOW (ref 6–23)
Calcium: 9.3 mg/dL (ref 8.4–10.5)
Creatinine, Ser: 0.65 mg/dL (ref 0.4–1.5)
GFR calc Af Amer: >60 mL/min (ref >60)
GFR calc non Af Amer: >60 mL/min (ref >60)

## 2011-01-10 LAB — CBC
HCT: 40.1 % (ref 39.0–52.0)
MCHC: 33.7 g/dL (ref 30.0–36.0)
MCV: 96.7 fL (ref 78.0–100.0)
Platelets: 198 10*3/uL (ref 150–400)

## 2011-01-10 LAB — DIFFERENTIAL
Basophils Absolute: 0 10*3/uL (ref 0.0–0.1)
Eosinophils Relative: 2 % (ref 0–5)
Lymphocytes Relative: 19 % (ref 12–46)
Lymphs Abs: 1.1 10*3/uL (ref 0.7–4.0)
Monocytes Absolute: 0.7 10*3/uL (ref 0.1–1.0)
Neutro Abs: 3.8 10*3/uL (ref 1.7–7.7)

## 2011-01-11 ENCOUNTER — Other Ambulatory Visit: Payer: Self-pay | Admitting: Oncology

## 2011-01-11 ENCOUNTER — Ambulatory Visit (HOSPITAL_COMMUNITY)
Admission: RE | Admit: 2011-01-11 | Discharge: 2011-01-11 | Disposition: A | Discharge status: Home / Self Care | Payer: Medicaid Other | Source: Ambulatory Visit | Attending: Oncology | Admitting: Oncology

## 2011-01-11 ENCOUNTER — Other Ambulatory Visit (HOSPITAL_COMMUNITY): Payer: Medicaid Other

## 2011-01-11 DIAGNOSIS — T82898A Other specified complication of vascular prosthetic devices, implants and grafts, initial encounter: Secondary | ICD-10-CM | POA: Insufficient documentation

## 2011-01-11 DIAGNOSIS — C099 Malignant neoplasm of tonsil, unspecified: Secondary | ICD-10-CM | POA: Insufficient documentation

## 2011-01-11 DIAGNOSIS — Y849 Medical procedure, unspecified as the cause of abnormal reaction of the patient, or of later complication, without mention of misadventure at the time of the procedure: Secondary | ICD-10-CM | POA: Insufficient documentation

## 2011-01-11 LAB — URINALYSIS, ROUTINE W REFLEX MICROSCOPIC
Glucose, UA: NEGATIVE mg/dL
Hgb urine dipstick: NEGATIVE
Hgb urine dipstick: NEGATIVE
Ketones, ur: >80 mg/dL — AB
Nitrite: NEGATIVE
Specific Gravity, Urine: 1.021 (ref 1.005–1.030)
Urobilinogen, UA: 1 mg/dL (ref 0.0–1.0)
pH: 6 (ref 5.0–8.0)

## 2011-01-11 LAB — COMPREHENSIVE METABOLIC PANEL
ALT: 116 U/L — ABNORMAL HIGH (ref 0–53)
ALT: 93 U/L — ABNORMAL HIGH (ref 0–53)
ALT: 9 U/L (ref 0–53)
AST: 29 U/L (ref 0–37)
AST: 53 U/L — ABNORMAL HIGH (ref 0–37)
AST: 88 U/L — ABNORMAL HIGH (ref 0–37)
AST: 20 U/L (ref 0–37)
Alkaline Phosphatase: 529 U/L — ABNORMAL HIGH (ref 39–117)
Alkaline Phosphatase: 531 U/L — ABNORMAL HIGH (ref 39–117)
Alkaline Phosphatase: 642 U/L — ABNORMAL HIGH (ref 39–117)
BUN: 8 mg/dL (ref 6–23)
CO2: 26 mEq/L (ref 19–32)
CO2: 28 mEq/L (ref 19–32)
CO2: 29 mEq/L (ref 19–32)
CO2: 29 mEq/L (ref 19–32)
Calcium: 8.6 mg/dL (ref 8.4–10.5)
Calcium: 8.9 mg/dL (ref 8.4–10.5)
Chloride: 104 mEq/L (ref 96–112)
Chloride: 99 mEq/L (ref 96–112)
Chloride: 99 mEq/L (ref 96–112)
Chloride: 106 mEq/L (ref 96–112)
Creatinine, Ser: 0.45 mg/dL (ref 0.4–1.5)
Creatinine, Ser: 0.45 mg/dL (ref 0.4–1.5)
Creatinine, Ser: 0.64 mg/dL (ref 0.4–1.5)
GFR calc Af Amer: >60 mL/min (ref >60)
GFR calc Af Amer: >60 mL/min (ref >60)
GFR calc Af Amer: >60 mL/min (ref >60)
GFR calc non Af Amer: >60 mL/min (ref >60)
GFR calc non Af Amer: >60 mL/min (ref >60)
GFR calc non Af Amer: >60 mL/min (ref >60)
GFR calc non Af Amer: >60 mL/min (ref >60)
Glucose, Bld: 159 mg/dL — ABNORMAL HIGH (ref 70–99)
Glucose, Bld: 91 mg/dL (ref 70–99)
Potassium: 3.7 mEq/L (ref 3.5–5.1)
Sodium: 142 mEq/L (ref 135–145)
Total Bilirubin: 5.8 mg/dL — ABNORMAL HIGH (ref 0.3–1.2)
Total Bilirubin: 6.9 mg/dL — ABNORMAL HIGH (ref 0.3–1.2)
Total Bilirubin: 0.6 mg/dL (ref 0.3–1.2)

## 2011-01-11 LAB — DIFFERENTIAL
Basophils Absolute: 0 10*3/uL (ref 0.0–0.1)
Basophils Absolute: 0 10*3/uL (ref 0.0–0.1)
Basophils Absolute: 0 10*3/uL (ref 0.0–0.1)
Basophils Relative: 0 % (ref 0–1)
Eosinophils Absolute: 0.2 10*3/uL (ref 0.0–0.7)
Eosinophils Absolute: 0.2 10*3/uL (ref 0.0–0.7)
Eosinophils Relative: 3 % (ref 0–5)
Lymphs Abs: 0.5 10*3/uL — ABNORMAL LOW (ref 0.7–4.0)
Monocytes Absolute: 1.5 10*3/uL — ABNORMAL HIGH (ref 0.1–1.0)
Monocytes Relative: 23 % — ABNORMAL HIGH (ref 3–12)
Neutro Abs: 7.5 10*3/uL (ref 1.7–7.7)
Neutrophils Relative %: 84 % — ABNORMAL HIGH (ref 43–77)

## 2011-01-11 LAB — CBC
HCT: 37.6 % — ABNORMAL LOW (ref 39.0–52.0)
HCT: 38.3 % — ABNORMAL LOW (ref 39.0–52.0)
Hemoglobin: 12.7 g/dL — ABNORMAL LOW (ref 13.0–17.0)
Hemoglobin: 13 g/dL (ref 13.0–17.0)
MCHC: 33.9 g/dL (ref 30.0–36.0)
MCHC: 33.9 g/dL (ref 30.0–36.0)
MCV: 96.1 fL (ref 78.0–100.0)
MCV: 96.3 fL (ref 78.0–100.0)
MCV: 96.5 fL (ref 78.0–100.0)
MCV: 96.9 fL (ref 78.0–100.0)
RBC: 3.57 MIL/uL — ABNORMAL LOW (ref 4.22–5.81)
RBC: 3.66 MIL/uL — ABNORMAL LOW (ref 4.22–5.81)
RBC: 3.98 MIL/uL — ABNORMAL LOW (ref 4.22–5.81)
RBC: 3.95 MIL/uL — ABNORMAL LOW (ref 4.22–5.81)
RDW: 16 % — ABNORMAL HIGH (ref 11.5–15.5)
WBC: 6.6 10*3/uL (ref 4.0–10.5)
WBC: 9 10*3/uL (ref 4.0–10.5)
WBC: 6.4 10*3/uL (ref 4.0–10.5)

## 2011-01-11 LAB — CULTURE, BLOOD (ROUTINE X 2)

## 2011-01-11 LAB — LIPASE, BLOOD: Lipase: 39 U/L (ref 11–59)

## 2011-01-11 MED ORDER — IOHEXOL 300 MG/ML  SOLN
50.0000 mL | Freq: Once | INTRAMUSCULAR | Status: AC | PRN
  Administered 2011-01-11: 35 mL

## 2011-01-18 LAB — COMPREHENSIVE METABOLIC PANEL
Albumin: 4.3 g/dL (ref 3.5–5.2)
Alkaline Phosphatase: 89 U/L (ref 39–117)
BUN: 9 mg/dL (ref 6–23)
Potassium: 4.1 mEq/L (ref 3.5–5.1)
Sodium: 137 mEq/L (ref 135–145)
Total Protein: 8.1 g/dL (ref 6.0–8.3)

## 2011-01-18 LAB — CBC
HCT: 40.4 % (ref 39.0–52.0)
Platelets: 152 10*3/uL (ref 150–400)
RDW: 16.1 % — ABNORMAL HIGH (ref 11.5–15.5)

## 2011-01-18 LAB — DIFFERENTIAL
Basophils Relative: 1 % (ref 0–1)
Lymphs Abs: 1.6 10*3/uL (ref 0.7–4.0)
Monocytes Absolute: 1.1 10*3/uL — ABNORMAL HIGH (ref 0.1–1.0)
Monocytes Relative: 16 % — ABNORMAL HIGH (ref 3–12)
Neutro Abs: 4.1 10*3/uL (ref 1.7–7.7)

## 2011-01-18 LAB — TYPE AND SCREEN
ABO/RH(D): A POS
Antibody Screen: NEGATIVE

## 2011-01-20 ENCOUNTER — Other Ambulatory Visit: Payer: Self-pay | Admitting: Radiation Oncology

## 2011-01-20 DIAGNOSIS — K08109 Complete loss of teeth, unspecified cause, unspecified class: Secondary | ICD-10-CM

## 2011-01-20 DIAGNOSIS — C099 Malignant neoplasm of tonsil, unspecified: Secondary | ICD-10-CM

## 2011-01-20 DIAGNOSIS — K08409 Partial loss of teeth, unspecified cause, unspecified class: Secondary | ICD-10-CM

## 2011-01-20 DIAGNOSIS — M278 Other specified diseases of jaws: Secondary | ICD-10-CM

## 2011-01-22 ENCOUNTER — Other Ambulatory Visit (HOSPITAL_COMMUNITY): Payer: Self-pay

## 2011-01-25 ENCOUNTER — Ambulatory Visit: Payer: Medicaid Other | Attending: Oncology

## 2011-01-25 ENCOUNTER — Ambulatory Visit (HOSPITAL_COMMUNITY)
Admission: RE | Admit: 2011-01-25 | Discharge: 2011-01-25 | Disposition: A | Discharge status: Home / Self Care | Payer: Medicaid Other | Source: Ambulatory Visit | Attending: Radiation Oncology | Admitting: Radiation Oncology

## 2011-01-25 DIAGNOSIS — IMO0001 Reserved for inherently not codable concepts without codable children: Secondary | ICD-10-CM | POA: Insufficient documentation

## 2011-01-25 DIAGNOSIS — R1310 Dysphagia, unspecified: Secondary | ICD-10-CM | POA: Insufficient documentation

## 2011-01-25 DIAGNOSIS — C099 Malignant neoplasm of tonsil, unspecified: Secondary | ICD-10-CM

## 2011-02-01 ENCOUNTER — Other Ambulatory Visit: Payer: Self-pay | Admitting: Oncology

## 2011-02-01 ENCOUNTER — Encounter (HOSPITAL_BASED_OUTPATIENT_CLINIC_OR_DEPARTMENT_OTHER): Payer: Medicaid Other | Admitting: Oncology

## 2011-02-01 DIAGNOSIS — Z5111 Encounter for antineoplastic chemotherapy: Secondary | ICD-10-CM

## 2011-02-01 DIAGNOSIS — C099 Malignant neoplasm of tonsil, unspecified: Secondary | ICD-10-CM

## 2011-02-01 LAB — CBC WITH DIFFERENTIAL/PLATELET
BASO%: 0.4 % (ref 0.0–2.0)
Eosinophils Absolute: 0.2 10*3/uL (ref 0.0–0.5)
MONO#: 0.9 10*3/uL (ref 0.1–0.9)
NEUT#: 1.3 10*3/uL — ABNORMAL LOW (ref 1.5–6.5)
Platelets: 174 10*3/uL (ref 140–400)
RBC: 3.42 10*6/uL — ABNORMAL LOW (ref 4.20–5.82)
RDW: 14.6 % (ref 11.0–14.6)
WBC: 4.6 10*3/uL (ref 4.0–10.3)
lymph#: 2.2 10*3/uL (ref 0.9–3.3)
nRBC: 0 % (ref 0–0)

## 2011-02-01 LAB — COMPREHENSIVE METABOLIC PANEL
ALT: 20 U/L (ref 0–53)
AST: 32 U/L (ref 0–37)
CO2: 29 mEq/L (ref 19–32)
Calcium: 9 mg/dL (ref 8.4–10.5)
Chloride: 103 mEq/L (ref 96–112)
Potassium: 4 mEq/L (ref 3.5–5.3)
Sodium: 139 mEq/L (ref 135–145)
Total Protein: 7.4 g/dL (ref 6.0–8.3)

## 2011-02-01 LAB — MAGNESIUM: Magnesium: 1.8 mg/dL (ref 1.5–2.5)

## 2011-02-02 ENCOUNTER — Ambulatory Visit (HOSPITAL_COMMUNITY): Payer: Self-pay | Admitting: Dentistry

## 2011-02-02 ENCOUNTER — Ambulatory Visit (HOSPITAL_COMMUNITY): Payer: Medicaid - Dental | Admitting: Dentistry

## 2011-02-02 DIAGNOSIS — R1310 Dysphagia, unspecified: Secondary | ICD-10-CM

## 2011-02-02 DIAGNOSIS — K08109 Complete loss of teeth, unspecified cause, unspecified class: Secondary | ICD-10-CM

## 2011-02-08 ENCOUNTER — Encounter (HOSPITAL_BASED_OUTPATIENT_CLINIC_OR_DEPARTMENT_OTHER): Payer: Medicaid Other | Admitting: Oncology

## 2011-02-08 ENCOUNTER — Other Ambulatory Visit: Payer: Self-pay | Admitting: Oncology

## 2011-02-08 DIAGNOSIS — C099 Malignant neoplasm of tonsil, unspecified: Secondary | ICD-10-CM

## 2011-02-08 DIAGNOSIS — D649 Anemia, unspecified: Secondary | ICD-10-CM

## 2011-02-08 DIAGNOSIS — Z5111 Encounter for antineoplastic chemotherapy: Secondary | ICD-10-CM

## 2011-02-08 DIAGNOSIS — D709 Neutropenia, unspecified: Secondary | ICD-10-CM

## 2011-02-08 LAB — CBC WITH DIFFERENTIAL/PLATELET
BASO%: 0.4 % (ref 0.0–2.0)
Basophils Absolute: 0 10*3/uL (ref 0.0–0.1)
EOS%: 2.8 % (ref 0.0–7.0)
HCT: 31.4 % — ABNORMAL LOW (ref 38.4–49.9)
MCH: 31.5 pg (ref 27.2–33.4)
MCHC: 33.4 g/dL (ref 32.0–36.0)
MCV: 94.3 fL (ref 79.3–98.0)
MONO%: 21.6 % — ABNORMAL HIGH (ref 0.0–14.0)
NEUT%: 54.7 % (ref 39.0–75.0)
RDW: 14.3 % (ref 11.0–14.6)
lymph#: 1.2 10*3/uL (ref 0.9–3.3)

## 2011-02-08 LAB — COMPREHENSIVE METABOLIC PANEL
AST: 24 U/L (ref 0–37)
Albumin: 3.9 g/dL (ref 3.5–5.2)
Alkaline Phosphatase: 78 U/L (ref 39–117)
BUN: 7 mg/dL (ref 6–23)
Glucose, Bld: 108 mg/dL — ABNORMAL HIGH (ref 70–99)
Potassium: 3.7 mEq/L (ref 3.5–5.3)
Total Bilirubin: 0.4 mg/dL (ref 0.3–1.2)

## 2011-02-10 ENCOUNTER — Encounter (HOSPITAL_BASED_OUTPATIENT_CLINIC_OR_DEPARTMENT_OTHER): Payer: Medicaid Other | Admitting: Oncology

## 2011-02-10 DIAGNOSIS — Z452 Encounter for adjustment and management of vascular access device: Secondary | ICD-10-CM

## 2011-02-10 DIAGNOSIS — C099 Malignant neoplasm of tonsil, unspecified: Secondary | ICD-10-CM

## 2011-02-12 ENCOUNTER — Encounter (HOSPITAL_BASED_OUTPATIENT_CLINIC_OR_DEPARTMENT_OTHER): Payer: Medicaid Other | Admitting: Oncology

## 2011-02-12 ENCOUNTER — Other Ambulatory Visit: Payer: Self-pay | Admitting: Oncology

## 2011-02-12 DIAGNOSIS — C099 Malignant neoplasm of tonsil, unspecified: Secondary | ICD-10-CM

## 2011-02-12 LAB — CBC WITH DIFFERENTIAL/PLATELET
Basophils Absolute: 0 10*3/uL (ref 0.0–0.1)
Eosinophils Absolute: 0.1 10*3/uL (ref 0.0–0.5)
HCT: 31.1 % — ABNORMAL LOW (ref 38.4–49.9)
HGB: 10.3 g/dL — ABNORMAL LOW (ref 13.0–17.1)
LYMPH%: 17.1 % (ref 14.0–49.0)
MCV: 94.8 fL (ref 79.3–98.0)
MONO#: 0.6 10*3/uL (ref 0.1–0.9)
MONO%: 20.3 % — ABNORMAL HIGH (ref 0.0–14.0)
NEUT#: 1.8 10*3/uL (ref 1.5–6.5)
NEUT%: 57.6 % (ref 39.0–75.0)
Platelets: 160 10*3/uL (ref 140–400)
WBC: 3.2 10*3/uL — ABNORMAL LOW (ref 4.0–10.3)
nRBC: 0 % (ref 0–0)

## 2011-02-12 LAB — COMPREHENSIVE METABOLIC PANEL
ALT: 21 U/L (ref 0–53)
BUN: 8 mg/dL (ref 6–23)
CO2: 26 mEq/L (ref 19–32)
Calcium: 8.6 mg/dL (ref 8.4–10.5)
Chloride: 100 mEq/L (ref 96–112)
Creatinine, Ser: 0.56 mg/dL (ref 0.40–1.50)
Glucose, Bld: 91 mg/dL (ref 70–99)
Total Bilirubin: 0.4 mg/dL (ref 0.3–1.2)

## 2011-02-12 LAB — MAGNESIUM: Magnesium: 1.8 mg/dL (ref 1.5–2.5)

## 2011-02-15 ENCOUNTER — Encounter (HOSPITAL_BASED_OUTPATIENT_CLINIC_OR_DEPARTMENT_OTHER): Payer: Medicaid Other | Admitting: Oncology

## 2011-02-15 DIAGNOSIS — C099 Malignant neoplasm of tonsil, unspecified: Secondary | ICD-10-CM

## 2011-02-15 DIAGNOSIS — Z5111 Encounter for antineoplastic chemotherapy: Secondary | ICD-10-CM

## 2011-02-16 NOTE — Op Note (Signed)
NAME:  ZEVEN, KOCAK            ACCOUNT NO.:  [id number]   MEDICAL RECORD NO.:  [id number]          PATIENT TYPE:  AMB   LOCATION:  ENDO                         [facility]ACILITY:  [facility]   PHYSICIAN:  Petra Kuba, M.D.    DATE OF BIRTH:  [birth year]   DATE OF PROCEDURE:  10/17/2008  DATE OF DISCHARGE:                               OPERATIVE REPORT   PROCEDURES:  1. Endoscopic retrograde cholangiopancreatogram.  2. Stent removal.  3. Balloon dilatation and two stents added.   Consent was signed after risks, benefits, methods, options thoroughly  discussed multiple times in the past.   MEDICINES USED:  General anesthesia per Anesthesia.   DESCRIPTION OF PROCEDURE:  The side-viewing therapeutic video  duodenoscope was inserted by indirect vision into the stomach and  advanced into the duodenum.  His old stent was brought into view.  It  was full of debris without signs of draining, although he was not  jaundiced  We grabbed this stent with the snare and removed it through  the scope.  We went ahead and obtained deep selective cannulation on the  first attempt using the triple-lumen sphincterotome loaded with the Jag  wire.  The distal stricture was shortened and unchanged, the CBD  slightly dilated.  Air was in the duct, intrahepatics were not  overfilled, minimal injections were okay.  We went ahead and proceeded  with the balloon dilatation of the stricture using the 10 mm 4 cm  balloon which was done in the customary fashion, exchanging the balloon  for the sphincterotome.  The balloon was blown up.  It did have contrast  in it.  We could not completely get rid of the waist on the balloon when  we dilated it, but held it up for a minute.  The balloon was removed and  we easily placed a 10-French 5-cm stent into the CBD. We went ahead and  recannulated with the sphincterotome loaded with the Jag wire and went  ahead and placed a 8.5, 5-cm stent next to this stent.  Unfortunately,  in  advancing the stent into the duct, the first stent did get pushed  further into the ampulla and in trying to grab it with the biopsy  forceps and pull it back, we accidentally pushed it further into the  ampulla.  We went ahead and cannulated the stent with the sphincterotome  loaded with the Jag wire, which was done under fluoro guidance, bowed  the sphincterotome and withdrew the stent to the proper position, the  wire and the sphincterotome were removed.  We then used the biopsy  forceps to push the second stent slightly further into the duct and we  were happy with the positioning of both stents at that juncture.  The  procedure was terminated at this juncture.  There were no PD wire  advancements or injections throughout the procedure.  The scope was  removed.  The patient tolerated the procedure well.  There was no  obvious immediate complication.   ENDOSCOPIC DIAGNOSES:  1. Snared the old stent full with debris.  2. Stricture approximately the same with slight  dilated common bile      duct, minimal intrahepatic filling.  3. Balloon dilatation of the distal duct 10 mm 4 cm balloon, but the      waist not completely resolved.  4. A 10 French 5 cm stent placed followed by the 8.5, 5-cm stent and      positions adjusted as above.   PLAN:  1. Customary post ERCP orders.  If no problems, follow-up with me      p.r.n. or in 2 months.  2. Possibly in the future, if the stricture remains, we should      consider the removable metal stent which should be released by      [facility] scientific shortly versus continuing present management.           ______________________________  Petra Kuba, M.D.     MEM/MEDQ  D:  10/17/2008  T:  10/17/2008  Job:  [id number]

## 2011-02-16 NOTE — H&P (Signed)
NAME:  Russell, Williamson NO.:  [id number]   MEDICAL RECORD NO.:  [id number]          PATIENT TYPE:  INP   LOCATION:  0110                         FACILITY:  [facility]   PHYSICIAN:  Marcellus Scott, MD     DATE OF BIRTH:  [birth year]   DATE OF ADMISSION:  06/17/2008  DATE OF DISCHARGE:                              HISTORY & PHYSICAL   PRIMARY MEDICAL DOCTOR:  Unassigned to [facility].   CHIEF COMPLAINTS:  Acute on chronic abdominal pain, nausea.   HISTORY OF PRESENTING ILLNESS:  Russell Williamson is a 55 year old, African  American male patient with history of chronic pancreatitis - possibly  alcohol induced, hypertension, polysubstance abuse, alcohol-related  seizure disorder who usually resides in [place], [place].  He is  visiting here for the last 1 week to [facility] and plans to stay with  his family here.  The patient indicates that he has chronic abdominal  pain which is 10/10 without medications and 7/10 after he has taken  medications.  He has had this for months if not years.  He has had  frequent ED visits to the [facility], [place], based  on their medical records.  In any event, the patient indicates that his  pain was bearable until 3 days ago when he started having 10/10  epigastric right upper quadrant abdominal pain with associated nausea.  The patient, however, continues to have good appetite and denies  vomiting.  He claims some questionable fevers and chills.  He has no  diarrhea and has passed soft stools 2 days ago.  For these complaints,  the patient presented to the emergency room where he was found to have a  markedly abnormal CT of the abdomen with contrast revealing marked  intrahepatic and extrahepatic biliary ductal dilatation.  GI was  consulted for possible admission.  However, GI has consulted on the  patient and have recommended the hospitalist service admit the patient.   PAST MEDICAL HISTORY:  1.  Hypertension.  2. Chronic pancreatitis.  3. Polysubstance abuse.  4. Seizures, alcohol related.   PAST SURGICAL HISTORY:  1. Status post cholecystectomy.  2. Colonoscopy on February 01, 2007; impression:  Normal colonoscopy.  3. EGD on Feb 05, 2007; impression:  Hiatal hernia, otherwise negative.  4. Port-A-Cath insertion on September 24, 2006.  5. ERCP on July 09, 2006, when found to have dilated biliary tree      with small filling defects.  These filling defects were cleared      following balloon extraction.  6. ERCP with sphincterotomy and stone extraction on July 09, 2006.  7. EGD on July 16, 2005, with hiatal hernia.  8. Laparoscopic cholecystectomy on May 01, 2005.  9. EGD on November 02, 2000, with impression of diffuse severe      gastritis, distal esophagitis, possible Barrett's esophagus, and      duodenitis.   PSYCHIATRIC HISTORY:  None.   ALLERGIES:  No known drug allergies.   CURRENT MEDICATIONS:  The patient indicates that he has ran out of all  of his medications 1 week ago.  According to the  Med Rec form:  1. Percocet 10/650 mg p.o. as needed.  2. Prilosec 20 mg p.o. b.i.d.  3. Metoprolol tartrate 50 mg p.o. b.i.d.  4. Creon 10 mg 3 times a day.  5. Phenergan 25 mg every 4 hours as needed.  6. Lisinopril, unclear dose p.o. daily.  7. Cymbalta 30 mg p.o. daily.   FAMILY HISTORY:  Is noncontributory.   SOCIAL HISTORY:  The patient is single and on disability.  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  He smokes 5 to 6 cigarettes per day he says for half of  his life.  He quit alcohol 4-1/2 to 5 years ago.  Prior to that, he  indicates he used to drink hard which included beer and wine.  He smoked  marijuana as a teenager.  He claims he last smoked crack a month ago.   REVIEW OF SYSTEMS:  Comprehensive 14 systems reviewed, and apart from  history of presenting illness is pertinent for:  1. Significant weight loss of up to 40 pounds over the last 2 to 3      months.   2. Some cough with creamy sputum.   PHYSICAL EXAMINATION:  Russell Williamson is a moderately built and poorly  nourished male patient in intermittent painful distress but no  respiratory distress.  HEENT:  Is nontraumatic, normocephalic.  Pupils equally reacting to  light and accommodation.  Oral cavity mildly dry.  NECK:  Supple.  No JVD or carotid bruit.  LYMPHATICS:  No lymphadenopathy.  RESPIRATORY SYSTEM:  Clear to auscultation.  CARDIOVASCULAR SYSTEM:  First and second heart sounds heard.  No third  or fourth heart sounds or murmurs.  ABDOMEN:  Nondistended.  Epigastric and right upper quadrant tenderness  with guarding.  No rigidity or rebound.  No organomegaly or mass  appreciated.  Bowel sounds are normally heard.  Venous hum in the  epigastric region.  CENTRAL NERVOUS SYSTEM:  The patient is awake, alert, oriented x3.  No  focal neurological deficits.  EXTREMITIES:  With no cyanosis, clubbing or edema.  Peripheral pulses  are symmetrically felt.  SKIN:  Is without any rashes.   RADIOLOGY:  1. CT of the abdomen with contrast; impression:      a.     Severe cachexia.      b.     Metallic stent within the stomach extending into the region       of the pancreatic tail.  Radiologist assumes this was placed       endoscopically in order to drain a pseudocyst at some point.  If       this does not correlate with the history, then the stent has       migrated from elsewhere and has eroded through the stomach.      c.     Edema in the mesenteric and retroperitoneal fat, worry       hypoproteinemia,      d.     Query hepatic cirrhosis.      e.     Marked thickening of the wall of the distal esophagus, query       reflux disease and esophagitis.      f.     Indeterminate bilateral renal lesions, particularly a lesion       in the posterior lower pole right kidney, measuring approximately       8 mm.  This lesion in the mid left kidney maybe a proteinaceous  cyst.      g.      Marked intrahepatic and extrahepatic biliary ductal       dilatation to the level of the intrahepatic portion of the common       bile duct.  Stricture suspected in this location.  Alternatively,       a calcification in the pancreatic head may have caused further       obstruction in this patient with chronic calcific pancreatitis and       severe pancreatic atrophy.  2. CT of the pelvis; impression:      a.     Edema in the mesenteric and retroperitoneal fact; again,       query hypoproteinemia.      b.     Distended urinary bladder.      c.     Mild prostate gland enlargement, particularly the median       lobe.   PERTINENT LABORATORY DATA:  Lipase 140.  Comprehensive metabolic panel:  Sodium 132, BUN 4, creatinine 0.53, alkaline phosphatase 150, AST 24,  ALT 16, total protein 6, albumin 3.  The rest of it is unremarkable.   CBC with hemoglobin of 11.1, hematocrit 33.4, white blood cell 5.9,  platelets 331, MCV 98.6.   ASSESSMENT AND PLAN:  1. Acute on chronic abdominal pain.  Differential diagnoses are: a)      Acute on chronic pancreatitis. b) Distal common bile duct      obstruction with proximal ductal dilatation - rule out strictures      versus stones versus mass lesion. c).  Gastritis or peptic ulcer      disease. d) Esophagitis. e) Cholangitis.  Will admit the patient to      medical floor.  Will place on regular diet as tolerated and IV      fluids.  For pain medications and antiemetic medications.  To      evaluate and manage underlying conditions.  2. Acute on chronic pancreatitis.  For regular diet, pain medication      and pancreatic enzyme supplements.  GI has been consulted.  ? MRCP      followed by ERCP.  Question repeat EGD.  3. Distal common bile duct obstruction with proximal intrahepatic and      extrahepatic biliary ductal dilatation.  GI again consulted.  ?      MRCP to assess anatomy followed by possible ERCP.  4. Rule out cholangitis.  The patient  empirically placed on IV      Rocephin.  5. Hypertension.  Continue with metoprolol.  Hold ACE inhibitors      secondary to poor oral intake at this time.  6. Polysubstance abuse - tobacco, marijuana, crack.  The patient      counseled.  7. Malnutrition.  Will need a nutrition consult.  8. History of alcohol-related seizures.  The patient indicates that he      has not had seizures in greater than a year. On no medications.  9. Chronic anemia.  Last hemoglobin was in the 12 range.  To follow up      CBCs.  10.Probable cystogastrostomy with metal stent.      Marcellus Scott, MD  Electronically Signed     AH/MEDQ  D:  06/17/2008  T:  06/17/2008  Job:  [id number]   cc:   Laural Benes, M.D.

## 2011-02-16 NOTE — H&P (Signed)
NAME:  Russell Williamson, Russell Williamson NO.:  [id number]   MEDICAL RECORD NO.:  [id number]          PATIENT TYPE:  INP   LOCATION:                               FACILITY:  [facility]   PHYSICIAN:  Vania Rea, M.D. DATE OF BIRTH:  1956-01-05   DATE OF ADMISSION:  07/24/2008  DATE OF DISCHARGE:                              HISTORY & PHYSICAL   PRIMARY CARE PHYSICIAN:  Varney Daily, M.D.   CHIEF COMPLAINT:  Abdominal pain and weakness.   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  His last visit to the ER was on October  12 for abdominal pain at which time his hemoglobin was noted to be 10.6.  He was discharged home with medications.  He returns today with  continuing abdominal pain and episode of vomiting this morning.  His  labs were checked again.  His hemoglobin was noted to be 7.1 and the  hospitalist service was called to assist with admission.   There was some confusion about the diagnosis of pancreatic cancer.  The  patient says his sister told him that a doctor told her that he had  pancreatic cancer but he has never been told of pancreatic cancer and  there is no such report in his medical record.   Other than nausea and vomiting, the patient also has dizziness  especially with standing.  He has no blood in his vomitus and no blood  or melena stool.   PAST MEDICAL HISTORY:  1. Chronic pancreatitis with stenting of the common bile duct by Dr.      Ewing Schlein in September.  2. Previous history of the metallic stent extending from the      pancreatic tail to the stomach, possibly a cystogastrostomy drain.   MEDICATIONS:  1. Phenergan 25 mg p.r.n.  2. Phenergan suppositories 25 mg p.r.n.  3.  Percocet 10/650 p.r.n.  4. Creon 1 tablet 4 times daily with meals.  5. Metoprolol 50 mg twice daily.  6. Cymbalta 30 mg daily.  7. Omeprazole 20 mg daily.  8. Vitamin D 400 international units daily.   ALLERGIES:  No Known Drug Allergies.   SOCIAL HISTORY:  Smokes 5-6 cigarettes per day.  Previously smoked 1  pack per day.  Has discontinued alcohol use for the past 5 years.  Denies illicit drug use.  Denies any significant problems in family  history.   REVIEW OF SYSTEMS:  Other than abdominal pain an episode of vomiting  this morning and dizziness.  The patient denies any problem on a 10-  point review of systems.   PHYSICAL EXAM:  Thin middle-aged African American gentleman lying in the  bed, does not appear acutely distressed at this time.  VITAL SIGNS:  Temperature is 97.3.  Pulse 85.  Respirations 18.  Blood  pressure 139/97.  Saturating 100% on 2 L.  Describes  his abdominal pain  as 10 out of 10.  Pupils are round and equal.  Mucous membranes pink and anicteric.  He is  mildly dehydrated.  No shotty cervical lymphadenopathy.  No thyromegaly  or jugular venous distention.  CHEST:  Clear to auscultation bilaterally.  CARDIOVASCULAR SYSTEM:  Regular rhythm without murmur.  ABDOMEN:  Scaphoid.  He is tender in epigastrium in the right upper  quadrant.  EXTREMITIES:  Thin and without edema.  He has symmetrical dorsalis pedis  pulses.  CENTRAL NERVOUS SYSTEM:  Cranial nerves II-XII are grossly intact and he  has no focal neurologic deficit.   LABS:  His white count is 3.4, hemoglobin 7.1, MCV 100, platelets 145.  His sodium is 136, potassium 3.7, chloride 104, CO2 26, BUN 9,  creatinine 0.6, total protein 6.3, albumin 3.2.  His liver function  tests are normal.  His stool was occult blood positive.  His urinalysis  is unremarkable   ASSESSMENT:  Severe anemia with abdominal pain likely upper  gastrointestinal bleed.  Chronic pancreatitis.   PLAN:  We will admit this  gentleman for a transfusion and we will a  consult with his gastroenterologist for possible upper endoscopy or  colonoscopy.  Other plans as per orders.      Vania Rea, M.D.  Electronically Signed     LC/MEDQ  D:  07/24/2008  T:  07/24/2008  Job:  [id number]   cc:   Della Goo, M.D.  Fax: [phone]   Petra Kuba, M.D.  Fax: [phone]

## 2011-02-16 NOTE — H&P (Signed)
NAME:  Russell Williamson, Russell Williamson            ACCOUNT NO.:  [id number]   MEDICAL RECORD NO.:  [id number]          PATIENT TYPE:  INP   LOCATION:  1530                         FACILITY:  [facility]   PHYSICIAN:  Oswald Hillock, MD        DATE OF BIRTH:  May 27, 1956   DATE OF ADMISSION:  03/28/2009  DATE OF DISCHARGE:                              HISTORY & PHYSICAL   CHIEF COMPLAINT:  Abdominal pain.   HISTORY OF PRESENT ILLNESS:  The patient is a 55 year old African  American male with history of chronic pancreatitis who presents to the  emergency room with 5-day history of progressively worsening abdominal  pain.  He admits to nausea, but no vomiting.  No fever, rigors, chills,  chest pain, shortness of breath, palpitations, dizziness, loss of  consciousness or any focal weakness of any part of the body.  The  patient states that he noted some blood in his urine today and denies  any other symptoms.  He has been taking his pain medications which  include Opana ER and oxycodone/acetaminophen q.i.d. without any  significant relief.  He has been admitted in the past with similar  symptoms and his last recorded admission was in October 2009, and he had  ERCP done in January 2010.   PAST MEDICAL HISTORY:  1. History of chronic pancreatitis.  2. Hypertension.  3. History of alcohol abuse.  4. History of GI bleed.  5. 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   SOCIAL HISTORY:  The patient smokes 5-6 cigarettes per day.  Discontinued alcohol use for the past 5 years.  Denies any illicit drug  use.  Is independent in all his ADLs and lives with family.   CURRENT MEDICATIONS:  1. Creon 12,000 units four times a day.  2. Metoprolol 50 mg twice a day.  3. Cymbalta 60 mg daily.  4. Omeprazole 40 mg twice daily.  5. Obana ER 10 mg twice daily.  6. Zolpidem 10 mg once daily.  7. Oxycodone/acetaminophen 10 mg four times a day.   ALLERGIES:  NO KNOWN DRUG ALLERGIES.   REVIEW OF SYSTEMS:  Extensive review of systems was done.  All systems  were negative except for the positives mentioned in history of present  illness.   PHYSICAL EXAMINATION:  VITALS:  On admission, pulse 70, blood pressure  120/75, respiratory rate 18, temperature 98.  GENERAL:  The patient is conscious, alert, oriented to time, place and  person in mild to moderate distress, still complaining of abdominal  discomfort.  HEENT:  Positive scleral icterus.  No pallor.  Ears negative.  Poor  dental hygiene.  NECK:  Supple.  No lymphadenopathy.  No JVD.  No carotid bruit.  CHEST:  Breath sounds heard bilaterally.  Fair air entry.  Occasional  rhonchi.  CV:  S1, S2 plus, regular.  No gallop, rub or murmur appreciated.  ABDOMEN:  Soft.  There is significant tenderness in the epigastric and  right upper quadrant.  No guarding or rebound.  Bowel sounds present in  all four quadrants.  EXTREMITIES:  No cyanosis, clubbing or edema.  NEUROLOGICAL:  Cranial nerves II-XII, grossly intact.  No focal motor or  sensory deficits noted on gross examination.   LABORATORY DATA:  CT scan of the abdomen and pelvis revealed stable  biliary ductal dilatation despite placement of two biliary stents,  stages of chronic pancreatitis that are stable, small bilateral renal  cyst and increase in linear atelectasis or scarring at the right lung  base.  Lipase was 14.  Sodium 134, potassium 2.7, chloride 99, CO2 23,  glucose 91, BUN 8, creatinine 0.81, calcium of 8.9, AST of 135, ALT of  158.  Alk-phos 642, bilirubin of 6.9.  WBC count 9, hemoglobin 12.7,  hematocrit is 37.6, platelet count of 228.  Urinalysis showed an orange  color with large bilirubin, negative nitrite, negative leukocyte  esterase.   IMPRESSION AND PLAN:  This is a case of a 55 year old African American  male with history of chronic pancreatitis related to alcohol abuse and  with strictures of the common bile duct status post stenting who   presents with abdominal pain.  1. Abdominal pain with abnormal LFTs and biliary dilatation despite      stenting.  The patient will be admitted to the Medical Service for      further evaluation and management.  GI has already been consulted      and are possibly going to repeat an ERCP on him in the morning.      The patient's lipase is within normal limits.  We will start him on      intravenous Dilaudid for pain control and continue his Opana ER for      now.  The patient will be put on Zofran on a p.r.n. basis for      nausea.  No evidence of cholangitis at this time given normal white      count.  No history of fever and normal vitals.  However, the      patient will be monitored closely for development of any      symptoms/signs that indicate that.patient will be started on unasyn      per G.I recommendation as risk of cholangitis is high.  2. The patient will be kept n.p.o. for now until possible procedure in      the morning.  3. Hypertension will continue his metoprolol 50 mg twice daily.  4. Depression.  Continue Cymbalta.  5. Insomnia.  The patient is on zolpidem 10 mg q.h.s. and will      continue that.  6. DVT/GI prophylaxis.  Protonix and compression devices.      Oswald Hillock, MD  Electronically Signed     BA/MEDQ  D:  03/28/2009  T:  03/29/2009  Job:  [id number]   cc:   Petra Kuba, M.D.  Fax: [phone]

## 2011-02-16 NOTE — Consult Note (Signed)
NAME:  Russell Williamson, Russell Williamson NO.:  [id number]   MEDICAL RECORD NO.:  [id number]          PATIENT TYPE:  INP   LOCATION:  1530                         FACILITY:  [facility]   PHYSICIAN:  Bernette Redbird, M.D.   DATE OF BIRTH:  July 05, 1956   DATE OF CONSULTATION:  03/28/2009  DATE OF DISCHARGE:                                 CONSULTATION   REASON FOR CONSULTATION:  Russell Hillock, MD of the [facility]  asked [place] to assist with the care of this 55 year old African American  male with probable biliary stent occlusion.   HISTORY:  [name] has a complicated past history.  He has a  previous history of ethanol abuse, none in recent years, but  unfortunately culminating in severe chronic pancreatitis.  Apparently he  had a pancreatic cystogastrostomy stent placed at the [place] of  [place] and that stent was removed last year.  He has a short distal  biliary stricture presumed to be secondary to pancreatic scarring and  this has been stented and balloon dilated by my partners.  The most  recent stent exchange was in January of this year and he actually had  two 10-French stents placed at that time.  He was in his usual state of  health until a few days ago when he started having upper abdominal pain  and nausea out of proportion to his normal baseline symptoms.  He  presented to the emergency room where his liver chemistries were noted  to be markedly elevated compared to baseline.  As a consequence, he is  being admitted to the hospital for further management.  He indicates he  was a little bit shaky, but it does not sound like he has had any fevers  or true rigors.  There has been no associated change in bowel habits.  There has been no vomiting.   ALLERGIES:  NO KNOWN MEDICATION ALLERGIES.   OUTPATIENT MEDICATIONS:  1. Creon.  2. Metoprolol.  3. Cymbalta.  4. Omeprazole.  5. Opana ER.  6. Amlodipine.  7. Ambien.  8. Percocet.   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   CHRONIC MEDICAL ILLNESSES:  Chronic pancreatitis.   HABITS:  Stopped drinking about 6 years ago and denies any alcohol  consumption whatsoever in that period of time.  I believe he still  smokes a small amount   FAMILY HISTORY:  Negative for GI diseases except for possible family  history of colon cancer in his mother at age 84, the patient is not  clear on details.   SOCIAL HISTORY:  The patient previously worked in [employer], but is  now disabled and is on Social Security disability.  He is from  [place], [place].   REVIEW OF SYSTEMS:  Per HPI.   PHYSICAL EXAMINATION:  GENERAL:  This is a well-nourished African  American male in no acute distress.  HEENT:  He is anicteric.  CHEST:  Clear.  HEART:  Normal without gallops, rubs, murmurs, clicks, or arrhythmias.  ABDOMEN:  Benign.  He really has no  significant epigastric tenderness at  this time.  The liver and spleen are nonpalpable.  There is no evident  ascites.  No guarding on abdominal exam, although bowel sounds are  quiet.  VITAL SIGNS:  Blood pressure 120/75, pulse of 70, respirations of 18,  temperature 98.0.   LABORATORY DATA:  White count 9000 with 84 polys, 5 lymphs, 11  monocytes.  Hemoglobin 12.7 which is essentially unchanged over the past  6 months.  Chemistry panel pertinent for marked elevation in liver  chemistries which is new compared to when he was checked in the  emergency room just 3 weeks ago.  Specifically, bilirubin is currently  6.9 as compared to 0.6 on March 09, 2009.  Alk phos is 642, whereas it was  105.  AST is 135, it was previously 20.  ALT is 158, it was previously  nine.  Lipase is normal at 14.  Urinalysis is positive for ketones.   DIAGNOSTICS:  CT scan, I reviewed the report.  There is no evidence of  any acute pancreatitis, pancreatic mass or pseudocyst.  He does have  extensive  pancreatic calcifications.  The biliary stents appear to be in  an appropriate location, but there is still biliary ductal dilatation as  previously noted.   IMPRESSION:  1. New onset elevation of liver chemistries with increasing baseline      abdominal symptoms, most likely due to stent occlusion.      Ironically, the patient was scheduled to have elective stent      exchange on April 16, 2009, just 3 weeks from now.  2. No evidence of septic cholangitis as of this time.  3. Chronic pancreatitis with previous pseudocyst drainage by internal      stent.  4. Chronic biliary stricture, balloon dilated last October and      chronically stented.  5. Chronic abdominal pain, being followed in a local pain clinic, on      multiple medications.  6. Possible family history of colon cancer.   RECOMMENDATIONS:  1. Even though the patient is not toxic at this time, specifically is      afebrile and with a normal white count, I would still cover him      with antibiotics because cholangitis can set in quite rapidly when      there is biliary stent occlusion.  2. We have made arrangements for the patient to undergo biliary stent      exchange by one of my partners tomorrow.  3. The patient would be a candidate for a screening colonoscopy on an      elective basis, possibly toward the tail end of this      hospitalization, but more likely as an outpatient.  The rationale      behind this, particularly if it is true that his mother had colon      cancer, was explained to the patient.           ______________________________  Bernette Redbird, M.D.     RB/MEDQ  D:  03/28/2009  T:  03/29/2009  Job:  [id number]   cc:   Chauncey Reading, MD

## 2011-02-16 NOTE — Discharge Summary (Signed)
NAMEKENNIE, Russell Williamson            ACCOUNT NO.:  [id number]   MEDICAL RECORD NO.:  [id number]          PATIENT TYPE:  INP   LOCATION:  1502                         FACILITY:  [facility]   PHYSICIAN:  Lonia Blood, M.D.       DATE OF BIRTH:  1956/03/31   DATE OF ADMISSION:  06/17/2008  DATE OF DISCHARGE:  06/21/2008                               DISCHARGE SUMMARY   DISCHARGE DIAGNOSIS:  1. Chronic pancreatitis with benign stricture of the common bile duct      status post stent placement by Dr. Ewing Schlein.  2. Severe protein caloric malnutrition.  3. Chronic malabsorption.  4. Hypertension.  5. History of heavy alcohol abuse.  6. Anemia of chronic disease.  7. Status post cholecystectomy.  8. Status post Port-A-Cath placement December 2007.  9. Status post metallic stenting.  10.Possible cirrhosis.   DISCHARGE MEDICATIONS:  1. Percocet 10/325 one every 4 hours as needed for pain.  2. Prilosec 20 meters twice a day.  3. Metoprolol 50 mg twice a day.  4. Creon 1 tablet before each meal.  5. Phenergan 25 mg as needed for nausea.  6. Cymbalta 30 mg daily.  7. Multivitamin daily.  8. Imodium as needed for diarrhea.   CONDITION ON DISCHARGE:  Russell Williamson is discharged in fair condition.  He will follow up with Dr. Della Goo on July 04, 2091 at  p.m.  The patient also follow up with Dr. Leary Roca from  gastroenterology.  He was told to call the office for an appointment.  The patient was also told to follow up with his gastroenterologist at  the [facility] of [place] for removal of the metallic stent.   PROCEDURE DURING THIS ADMISSION:  1. The patient underwent a magnetic resonance cholangiopancreatography      indicating dilation of the intra and extrahepatic biliary ducts to      a level about 3 cm proximal to the ampule, complex cyst in both      kidneys.  2. CT scan of the abdomen and pelvis with findings of a metallic stent      within the stomach extending into the  pancreatic tail possible      cystogastrostomy drain.  3. Possible cirrhosis of the liver.  4. Distal esophagus thickening.  5. Dilated intra and extrahepatic biliary duct.  6. Mild prostatic enlargement.  7. June 20, 2008 ERCP with placement of stent in the CBD.   HISTORY OF PRESENT ILLNESS:  Refer to dictated H&P done by Dr. Marcellus Scott.   CONSULTATIONS:  This patient was seen in consultation by the [facility].   HOSPITAL COURSE.:  Acute on chronic abdominal pain.  Russell Williamson was  brought to the emergency room by his sister after he was complaining of  worsening abdominal pain.  The patient has well known chronic  pancreatitis with multiple abdominal procedures and chronic dilated  ducts.  The patient was treated with intravenous antibiotics and  symptomatic medications and was seen in consultation by the [facility]  [facility].  The patient had a ERCP with plastic stent  placement in  his CBD.  The Russell Williamson is discharged in good condition  today on June 21, 2008 with instructions to follow up with his new  primary care physician and with Dr. Ewing Schlein.      Lonia Blood, M.D.  Electronically Signed     SL/MEDQ  D:  06/21/2008  T:  06/24/2008  Job:  [id number]

## 2011-02-16 NOTE — Consult Note (Signed)
NAME:  Russell Williamson, Russell Williamson            ACCOUNT NO.:  [id number]   MEDICAL RECORD NO.:  [id number]          PATIENT TYPE:  INP   LOCATION:  1414                         FACILITY:  [facility]   PHYSICIAN:  Graylin Shiver, M.D.   DATE OF BIRTH:  Apr 09, 1956   DATE OF CONSULTATION:  DATE OF DISCHARGE:                                 CONSULTATION   REASON FOR CONSULTATION:  Anemia, heme-positive stool.   HISTORY:  The patient is a 55 year old male who presented to the  emergency room with complaints of abdominal pain and in the emergency  room he was evaluated and found to have hemoccult positive stools.  His  hemoglobin and hematocrit were 7.1 and 21.3 respectively.  A previous  hemoglobin and hematocrit on July 15, 2008 were 10.6 and 32.2  respectively.  The patient states that he has noted that occasionally  his stools have been dark in color either a light or greenish color.  He  denies bright red blood in the stools.  He denies vomiting or  hematemesis.   His abdominal pain in the upper abdomen near the epigastrium.   We were called in consultation to evaluate this.   PAST MEDICAL HISTORY:  Significant for chronic pancreatitis.  The  patient his a history of a cyst in the pancreas some time last year and  recalls having had some type of procedure in [place] where he had a  stent placed in the pancreas.  His CT scan done last admission shows  that there is a metallic stent in the region of the tail of pancreas in  his stomach.  I suspect that the patient had a [facility]ast[facility]  [facility] [place].  The patient does not remember  the name of who did it.  Last month he was here in the hospital with  abdominal pain and had dilated bile ducts.  He had an ERCP done by Dr.  Ewing Schlein with biliary stent placement for probable benign strictures of the  bile duct.   ALLERGIES:  None known.   MEDICATIONS PRIOR TO ADMISSION:  Phenergan, Percocet, Creon, metoprolol,  Cymbalta, omeprazole, vitamin D.   SOCIAL HISTORY:  He smokes, used to drink alcohol heavily but does not  anymore.   REVIEW OF SYSTEMS:  He is not complaining of chest pain or shortness of  breath.   PHYSICAL:  CONSTITUTIONAL:  He is in no acute distress, nonicteric.  HEART:  Regular rhythm.  No murmurs.  LUNGS:  Clear.  ABDOMEN:  Bowel sounds are normal.  It is soft.  There is some mild  tenderness in the epigastrium but no rebound, guarding or  hepatosplenomegaly.  EXTREMITIES:  No edema.   IMPRESSION:  1. Anemia.  2. Heme-positive stool.  3. History of chronic pancreatitis.  4. History of a metallic stent which appears to be from the tail of      pancreas into the stomach compatible with cyst gastrostomy with      metallic stent placed.  This appears to have been done      endoscopically.  The patient does not remember  ever having had a      surgery for this and there is also a recent plastic biliary stent      placed for apparent benign common bile duct stricture.   PLAN:  In regards to his anemia, the patient will be set up for an EGD  and colonoscopy to evaluate the anemia and heme-positive stool.  It  appears that his biliary stent is effective since his liver enzymes,  including bilirubin, alkaline phosphatase are currently normal.           ______________________________  Graylin Shiver, M.D.     SFG/MEDQ  D:  07/24/2008  T:  07/24/2008  Job:  [id number]   cc:   [employer]   Petra Kuba, M.D.  Fax: [phone]

## 2011-02-16 NOTE — Op Note (Signed)
NAME:  Russell Williamson, Russell Williamson            ACCOUNT NO.:  [id number]   MEDICAL RECORD NO.:  [id number]          PATIENT TYPE:  AMB   LOCATION:  ENDO                         FACILITY:  [facility]   PHYSICIAN:  Petra Kuba, M.D.    DATE OF BIRTH:  May 26, 1956   DATE OF PROCEDURE:  DATE OF DISCHARGE:                               OPERATIVE REPORT   PROCEDURE:  ERCP with stent removal and balloon pull-through.   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  Consent  was signed after risks, benefits, methods, options thoroughly discussed  multiple times in the past.  Medicines used per general anesthesia.   PROCEDURE IN DETAIL:  The side-viewing therapeutic video duodenoscope  was inserted by indirect vision into the stomach and advanced into the  duodenum where a previously placed metal stent was seen in proper  position.  There was some debris inside the stent.  Using the small rat  tooth forceps the end of the stent was grabbed in the customary fashion  and it was surprisingly easily removed and the scope and the stent were  removed and the stent was recovered.  We went ahead and sent the stent  to cytology.  The scope was reinserted back into the duodenum and using  the triple lumen sphincterotome deep selective cannulation was easily  obtained.  The wire was advanced into the intrahepatics.  The stricture  and CBD was filled with dye.  There seemed to be adequate drainage.  There were no PD injections or wire advancements throughout the  procedure.  We went ahead and exchanged the sphincterotome for the 12  and 15 mm adjustable balloon and proceeded with two balloon pull-  throughs with very minimal debris and sludge being removed.  The balloon  pulled readily through the CBD.  We then went ahead and proceeded with  two different occlusion cholangiograms which did show a slight narrowing  and an irregular distal duct  but no significant stricture.  After the  balloons were pulled through on both occlusions there seemed to be  adequate biliary drainage so we elected, the duct was washed and watched  and after reviewing all the films we elected to leave the stenting out  and see how he does clinically to see if the metal stent caused a more  sustained dilatation.  The scope was removed.  There was minimal trauma  to the proximal stomach and distal esophagus from removing the stent but  no significant complications.  The patient tolerated the procedure well.  There was no obvious immediate complication.   ENDOSCOPIC DIAGNOSES:  1. Stent in good position, status post grabbed with the short rat      tooth and removed and sent for cytology.  2. No PD injection or wire advances.  3. Injected dye with the sphincterotome with an irregular distal      common bile duct but drained well and only minimal narrowing.  4. Pulled 12 mm adjustable balloon through with minimal debris and      sludge.  5.  Negative occlusion cholangiogram x2 except for distal duct being      irregular but no significant stricture seen and adequate biliary      drainage.  6. Elected to leave stenting out and see how he does clinically.   PLAN:  Follow him clinically, watching liver tests for signs of  cholangitis.  Obviously if he recurs in the near future will probably  need longer term stenting.  Will see him back in a month or p.r.n. to  reevaluate and also probably set up or consider an EUS at [facility] for  celiac block if are interested in proceeding.  Have him call me sooner  p.r.n.           ______________________________  Petra Kuba, M.D.     MEM/MEDQ  D:  05/29/2009  T:  05/29/2009  Job:  [id number]   cc:   Lerry Liner

## 2011-02-16 NOTE — Discharge Summary (Signed)
NAME:  Russell Williamson, Russell Williamson            ACCOUNT NO.:  [id number]   MEDICAL RECORD NO.:  [id number]          PATIENT TYPE:  INP   LOCATION:  1414                         FACILITY:  [facility]   PHYSICIAN:  Hillery Aldo, M.D.   DATE OF BIRTH:  1955/12/15   DATE OF ADMISSION:  07/24/2008  DATE OF DISCHARGE:  07/27/2008                               DISCHARGE SUMMARY   PRIMARY CARE PHYSICIAN:  Dr. Della Goo, M.D.   DISCHARGE DIAGNOSES:  1. Acute GI bleed.  2. Gastritis status post upper endoscopy.  3. Hypertension.  4. Bradycardia.  5. Chronic pancreatitis.  6. History of alcohol abuse.  7. 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   DISCHARGE MEDICATIONS:  1. Norvasc 10 mg daily.  2. Phenergan 25 mg q.6 h p.r.n.  3. Percocet 10/650 one q.6 h p.r.n. (#60 written for).  4. Freon one tablet q.i.d. with meals.  5. Cymbalta 30 mg daily.  6. Protonix 40 mg daily.  7. Vitamin D 400 international units daily.   CONSULTATION:  Dr. Evette Cristal of Gastroenterology.   BRIEF ADMISSION HISTORY OF PRESENT ILLNESS:  The patient is a 55-year-  old male with past medical history of chronic pancreatitis related to a  history of alcohol abuse as well as a history of benign stricture,  common bile duct, status post stenting who was discharged recently from  this facility after a stenting procedure.  He presented to the hospital  with chief complaint of abdominal pain and weakness.  He was noted to be  heme positive on rectal exam, and therefore, was admitted for further  evaluation workup.  For the full details, please see the dictated report  done by Dr. Orvan Falconer.   PROCEDURES AND DIAGNOSTIC STUDIES:  Upper endoscopy on 07/25/2008 showed  a mental stent in the wall of the stomach which was removed.  There was  mild active gastritis.   DISCHARGE LABORATORY VALUES:  White blood cell count was 5.8, hemoglobin  14.4, hematocrit 44.3, platelets 165.   HOSPITAL  COURSE:  1. Gastrointestinal bleed with acute gastritis.  The patient was      admitted and kept n.p.o.  GI consultation was requested and kindly      provided by Dr. Evette Cristal who took the patient for upper endoscopy on      07/25/2008.  The patient had a stent that had become dislodged and      was in the wall of the stomach but was removed during the      endoscopy.  Apparently, was a stent for a cystogastrostomy      drainage.  The patient was maintained on proton pump inhibitor      therapy and had no evidence of acute blood loss that was ongoing as      evidenced by stable hemoglobin levels.  After his endoscopy, his      diet was gradually advanced and he is tolerating this well.  He is      therefore stable for discharge and being maintained on proton pump  inhibitor therapy for gastritis as an outpatient.  2. Chronic pancreatitis.  The patient takes antiemeticsand narcotics      chronically for management of this.  3. Hypertension.  The patient is relative bradycardia, his Lopressor      was discontinued and he was started on Norvasc.  Norvasc will      control his blood pressure without dropping his pulse rate.   DISPOSITION:  The patient is medically stable for discharge.  He will be  discharged home.  He is encouraged to call Dr. Lovell Sheehan for a follow up  appointment.      Hillery Aldo, M.D.  Electronically Signed     CR/MEDQ  D:  07/27/2008  T:  07/27/2008  Job:  [id number]   cc:   Della Goo, M.D.  Fax: [phone]

## 2011-02-16 NOTE — Op Note (Signed)
NAME:  Russell Williamson, Russell Williamson            ACCOUNT NO.:  [id number]   MEDICAL RECORD NO.:  [id number]          PATIENT TYPE:  INP   LOCATION:  1502                         FACILITY:  [facility]   PHYSICIAN:  Petra Kuba, M.D.    DATE OF BIRTH:  Feb 11, 1956   DATE OF PROCEDURE:  06/20/2008  DATE OF DISCHARGE:  06/21/2008                               OPERATIVE REPORT   PROCEDURE:  Endoscopic retrograde cholangiopancreatography with  brushing, balloon pull-through and stent placement.   INDICATIONS:  Patient with CBD stricture, probably from chronic  pancreatitis.  Consent was signed after risks, benefits, methods,  options thoroughly discussed multiple times in the past and with me  yesterday.   MEDICINES USED:  Per anesthesia, general.   PROCEDURE:  The side-viewing therapeutic video duodenoscope was inserted  into the stomach, some biliary secretions were suctioned, and advanced  into the duodenum.  Initially the minor ampulla was seen and was  slightly bulbous.  There was some duodenitis.  The major papilla was  brought into view.  There was obvious evidence of a previous  sphincterotomy.  The triple-lumen sphincterotome was inserted and deep  selective cannulation was obtained.  The Al Pimple was advanced into the  intrahepatics.  The CBD was filled.  He did in fact, seem to have a very  short distal stricture not well-seen due to the scope in place.  The  rest of the CBD was slightly dilated.  There seemed to be on the initial  injection a possible stone in the CBD.  We went ahead and removed the  sphincterotome, keeping the wire in the proper position in the  intrahepatic. And went ahead and brushed the distal duct in the  customary fashion.  We then advanced the 12 to 15-mm adjustable balloon  up to the bifurcation and withdrew the balloon a few times to the  stricture.  We could not withdraw the 12-mm nor the 8-mm balloon through  the stricture.  It seemed to be too tight, too much  resistance, so the  balloon was deflated.  We then went ahead and proceeded with an  occlusion cholangiogram and no residual stones were seen.  We went ahead  at that juncture and inserted a 10-French, 5-cm stent, plastic, over the  wire in the customary fashion with good drainage and positioning  endoscopically and under fluoro.  The scope was slowly withdrawn but we  could not get a good look at the stomach so we removed the side-viewing  scope and inserted the upper endoscope.  The esophagus was normal.  The  metal stent was seen in the stomach in the proximal stomach.  Based on  how much was in the stomach, we could not get a good angle on the  opening of the stent.  We did advance the scope to the area where it  entered the stomach and fluoroscoped and could see that probably 90% or  80% was in the stomach and only minimally was left in the submucosal  area.  We then advanced into the antrum.  His pylorus and bulb were  deformed but we advanced  into the second portion of the duodenum and our  stent was seen in the proper position.  The duodenitis was confirmed.  The scope was removed.  The patient tolerated the procedure well.  There  was no obvious immediate complication.   ENDOSCOPIC DIAGNOSES:  1. Mild duodenitis.  2. Increased minor papilla.  3. Previous sphincterotomy.  4. No pancreatic duct injections.  5. Dilated common bile duct with distal short stricture, status post      brushing and stenting at the end of the procedure using a 10-      [place], 5-cm stent with good position and drainage.  6. A 12 to 15-mm adjustable balloon withdrawn a few times up to the      stricture.  No obvious stones seen at the end of the procedure or      with occlusion cholangiogram, although one possibly seen at the      beginning.  7. Endoscopy done which showed the stent in the stomach, about 80-90%      already in the stomach.  Could not inject there.  It also showed a      deformed  pylorus and the bulbitis and duodenitis as mentioned      above.   PLAN:  We will rediscuss removing the stent with the [facility] MD, who might  want a pancreatogram, which he had asked for but was unable to do, or a  __________ MRI.  If that is not done here in town, may ask him to return  to [place].  He still has relatives, there so that could be done.  We  will see how he does clinically and, hopefully, his liver test come down  and he is at his baseline, chronic pancreatic pain on the current  medicines including pancreatic enzymes, could go home soon and follow up  with me in a few weeks in the office.           ______________________________  Petra Kuba, M.D.     MEM/MEDQ  D:  06/20/2008  T:  06/22/2008  Job:  [id number]   cc:   [facility] Team

## 2011-02-16 NOTE — Op Note (Signed)
NAME:  Russell Williamson, Russell Williamson            ACCOUNT NO.:  [id number]   MEDICAL RECORD NO.:  [id number]          PATIENT TYPE:  INP   LOCATION:  1530                         FACILITY:  [facility]   PHYSICIAN:  John C. Madilyn Fireman, M.D.    DATE OF BIRTH:  Oct 27, 1955   DATE OF PROCEDURE:  03/29/2009  DATE OF DISCHARGE:                               OPERATIVE REPORT   PROCEDURE PERFORMED:  Endoscopic retrograde cholangiopancreatography  with stent placement.   INDICATION FOR PROCEDURE:  Common bile duct stricture status post  multiple ERCPs with stenting in the past, who presents with abdominal  pain and obstructive jaundice.  His last stents were placed in January  of 2010.   PROCEDURE:  The patient was placed in the prone position and placed on  the pulse monitor with continuous low-flow oxygen delivered by nasal  cannula.  He was anesthetized by general anesthesia in the operating  room.  The Olympus side-viewing endoscope was advanced blindly into the  oropharynx, esophagus, and stomach.  The pylorus was traversed and  papilla of Vater located on the medial duodenal wall with 2 extruding  stents that seem to be positioned too distally.  They were grabbed with  a snare and removed with the scope.  The scope was reintroduced and the  papilla addressed and cannulated with a Wilson-Cook sphincterotome.  A  cholangiogram was obtained, which showed distal stricture approximately  1 to 2 cm in diameter with tapering to a dilated common bile duct,  common hepatic duct, and some intrahepatic dilatation as well.  There  appeared to possibly be some sludgy debris on the cholangiogram, but no  distinct stones.  A 4 cm, 10-mm balloon dilator was inflated in the  distal stricture and held for 1 minute.  After this was done, then a  Wall-Flex 10 mm x 4 cm removable covered biliary stent was then placed  with good positioning.  The final cholangiogram showed good expansion of  the stent and good drainage of somewhat  dirty bile through the stent,  which eventually cleared to clear amber bile.  The scope was then  withdrawn, and the patient returned to the recovery room in stable  condition.  He tolerated the procedure well and there were no immediate  complications.   IMPRESSION:  Biliary stricture with malfunctioning stents replaced with  a removable metal stent.   PLAN:  Continue antibiotics in the short-term and monitor liver function  tests and clinical response.  He will need the stent changed or removed  in 2 months.           ______________________________  Everardo All. Madilyn Fireman, M.D.     JCH/MEDQ  D:  03/29/2009  T:  03/29/2009  Job:  [id number]

## 2011-02-16 NOTE — Consult Note (Signed)
NAME:  Russell Williamson, DETTMER NO.:  [id number]   MEDICAL RECORD NO.:  [id number]          PATIENT TYPE:  EMS   LOCATION:  ED                           FACILITY:  [facility]   PHYSICIAN:  Danise Edge, M.D.   DATE OF BIRTH:  1956-04-25   DATE OF CONSULTATION:  DATE OF DISCHARGE:                                 CONSULTATION   HISTORY OF PRESENT ILLNESS:  We were asked to see Mr. Palladino today by  the [facility] ER physician Dr. Effie Shy and [facility] hospitalist Dr.  Sharon Seller in consultation for new onset CBD dilatation in the setting of  chronic pancreatitis.   HISTORY OF PRESENT ILLNESS:  This is a 55 year old [place] American male  with a history of polysubstance abuse who moved to [place]  approximately a month ago from [place], [place] to be cared for by  his sister.  Per notes from his [facility] visit, the  patient had an ultrasound done on May 09, 2007 that showed no biliary  dilatation.  CT scan today shows marked intra and extrahepatic ductal  dilatation to the level of the intrapancreatic portion of the CBD.  Strictures are suspected.  Also notes marked thickening of the distal  esophagus.  The patient had no primary physician and [place] (he was  listed in the ER notes as being noncompliant)  He tells me that he ran  out of well as prescription medications approximately one week ago.  His  epigastric pain is much worse than it has been.  The patient has managed  his own pancreatitis for years on pain medications and by eating a low  fat diet, mostly liquid soups.  He tells me that he has lost over 40  pounds.  He denies vomiting and fever, but endorses chronic nausea,  green black diarrhea yesterday.  Today he reports he is constipated.   PAST MEDICAL HISTORY:  1. Chronic pancreatitis.  2. Polysubstance abuse including cocaine in August 2009 by his      [place] ER records.  He denies recent alcohol intake and endorses      tobacco  use.   PAST MEDICAL HISTORY:  Significant for hypertension, seizure disorder,  insomnia.  He has a Port-A-Cath placed.  He is status post  cholecystectomy.  Medical records of [place], [place] tells me that  he has multiple Op notes that are being faxed down, so we will await  those records.   ALLERGIES:  He has no known drug allergies.   CURRENT MEDICATIONS:  Omeprazole, metoprolol, lisinopril, Creon,  Phenergan, Cymbalta and Percocet.  Although, as I stated, he has been on  no medications for the past week.   REVIEW OF SYSTEMS:  As per HPI.   SOCIAL HISTORY:  As per HPI.   FAMILY HISTORY:  He tells me that he has no pancreatic or colon cancer  in his family.   PHYSICAL EXAMINATION:  GENERAL:  He is awake and appropriate.  Generally  ill appearing.  Looks much older than stated age.  VITAL SIGNS:  Temperature is 97.6, pulse 95, respirations are 20, blood  pressure is 155/105,  respirations sound relatively clear.  HEART:  Rate is regular.  ABDOMEN:  Cachectically thin, firm, tender throughout, particularly in  the epigastrium.  He has no appreciable bowel sounds to my auscultation.   LABORATORY DATA:  Current labs show a lipase of 140.  His lipase April 23, 2008, in [place] was 134.  LFTs today show an AST of 24, ALT 16,  alk phos 150, total bilirubin 0.4 again LFTs on April 23, 2008, showed  AST 25, ALT 36, alk phos 122, total bilirubin 0.2.  Hemoglobin today is  11.1.  His hemoglobin April 23, 2008 was 12.9.  white count 5.9 today,  platelets 331,000, hematocrit 33.4.  Radiological exams include an  ultrasound that was done May 08, 2008 that showed no biliary ductal  dilatation.  His CT done today shows:  1. Marked intra and extrahepatic ductal dilatation to the level of the      intrapancreatic portion of the CBD, stricture suspected.  2. Edema in the mesenteric and retroperitoneal fat.  3. Metallic stent in the stomach extending into the pancreatic head.  4. Marked  thickening of the distal esophagus.   ASSESSMENT:  Dr. Danise Edge has seen and examined the patient,  collected a history and reviewed his chart.  GI issues include the  following:  1. Distal common bile duct obstruction with intrahepatic and      extrahepatic biliary ductal dilatation.  2. Chronic pancreatitis, probable alcoholic pancreatitis.  3. Probable cystogastrostomy with metal stent.  4. Port-A-Cath in place.   RECOMMENDATIONS:  1. Rocephin 1 gram IV q.24 h.  Will check PT/INR.  2. MRCP to assess the anatomy and help plan ERCP to evaluate      intrahepatic biliary ductal dilatation and obstruction.  3. Remote cholecystectomy.  4. Chronic pancreatitis, probable endoscopic cystogastrostomy with      metal stent.  5. Pain control.   Thanks very much for this consultation.      Stephani Police, PA    ______________________________  Danise Edge, M.D.    MLY/MEDQ  D:  06/17/2008  T:  06/18/2008  Job:  [id number]   cc:   Danise Edge, M.D.  Fax: [phone]   Petra Kuba, M.D.  Fax: [phone]

## 2011-02-16 NOTE — Op Note (Signed)
NAME:  Russell Williamson, Russell Williamson            ACCOUNT NO.:  [id number]   MEDICAL RECORD NO.:  [id number]          PATIENT TYPE:  INP   LOCATION:  1414                         FACILITY:  [facility]   PHYSICIAN:  Graylin Shiver, M.D.   DATE OF BIRTH:  03-Feb-1956   DATE OF PROCEDURE:  07/25/2008  DATE OF DISCHARGE:  07/27/2008                               OPERATIVE REPORT   PROCEDURE:  Esophagogastroduodenoscopy with gastric metal stent removal.   INDICATION FOR PROCEDURE:  We were consulted on this patient because of  heme positive stool and anemia.  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  The patient had an ERCP with  biliary stent placement last month by Dr. Ewing Schlein while he was in the  hospital.  After that procedure Dr. Ewing Schlein spoke to the physician at the  [place] [facility] [place] who placed the metallic pancreatic stent and had  reviewed the CT scan and it was felt that the metallic stent should be  taken out since it had essentially come out of the pancreas and a little  bit was just in the stomach wall.  The patient was supposed to come back  and follow up in the office with Dr. Ewing Schlein but he ended up in the  hospital.  We were asked to see him in regards to heme positive anemia.   Of note is that the patient had a colonoscopy last year which was  normal.   Informed consent was obtained after explanation of the risks of  bleeding, infection and perforation.   PREMEDICATIONS:  Fentanyl 50 mcg IV, Versed 4 mg IV.   PROCEDURE IN DETAIL:  With the patient in the left lateral decubitus  position, the Pentax gastroscope was inserted into the oropharynx and  passed into the esophagus.  It was advanced down the esophagus then into  the stomach and into the duodenum.  The second portion and bulb of the  duodenal mucosa looked normal.  I did see the biliary stent that Dr.  Ewing Schlein had placed last month.  The  stomach showed some erythema but not  much.  The metallic stent was seen in the stomach.  Most of it appeared  to be in the lumen.  There was some still embedded in the gastric wall.  The stent was grasped with a snare and easily removed.  There was some  oozing at the site that it was removed from but not much.  The stent was  then taken out, the esophagus looked normal.  He tolerated the procedure  well without complications.   IMPRESSION:  1. Biliary stent in place.  2. Mild gastritis.  3. Metallic stent removed from the stomach.  There was some oozing and      some ulceration at the area where the stent was imbedded in the      stomach.  This should heal spontaneously with proton pump inhibitor      therapy.           ______________________________  Gordan Payment.  Evette Cristal, M.D.     [name]  D:  07/28/2008  T:  07/28/2008  Job:  [id number]   cc:   Petra Kuba, M.D.  Fax: [phone]

## 2011-02-17 ENCOUNTER — Encounter (HOSPITAL_BASED_OUTPATIENT_CLINIC_OR_DEPARTMENT_OTHER): Payer: Medicaid Other | Admitting: Oncology

## 2011-02-17 DIAGNOSIS — C099 Malignant neoplasm of tonsil, unspecified: Secondary | ICD-10-CM

## 2011-02-17 DIAGNOSIS — Z452 Encounter for adjustment and management of vascular access device: Secondary | ICD-10-CM

## 2011-02-19 ENCOUNTER — Other Ambulatory Visit: Payer: Self-pay | Admitting: Oncology

## 2011-02-19 ENCOUNTER — Encounter (HOSPITAL_BASED_OUTPATIENT_CLINIC_OR_DEPARTMENT_OTHER): Payer: Medicaid Other | Admitting: Oncology

## 2011-02-19 DIAGNOSIS — C099 Malignant neoplasm of tonsil, unspecified: Secondary | ICD-10-CM

## 2011-02-19 LAB — COMPREHENSIVE METABOLIC PANEL
ALT: 17 U/L (ref 0–53)
AST: 22 U/L (ref 0–37)
Albumin: 4.4 g/dL (ref 3.5–5.2)
CO2: 24 mEq/L (ref 19–32)
Calcium: 8.8 mg/dL (ref 8.4–10.5)
Chloride: 99 mEq/L (ref 96–112)
Potassium: 4.1 mEq/L (ref 3.5–5.3)

## 2011-02-19 LAB — MAGNESIUM: Magnesium: 1.9 mg/dL (ref 1.5–2.5)

## 2011-02-19 LAB — CBC WITH DIFFERENTIAL/PLATELET
BASO%: 0 % (ref 0.0–2.0)
Basophils Absolute: 0 10*3/uL (ref 0.0–0.1)
EOS%: 2.3 % (ref 0.0–7.0)
HCT: 29.5 % — ABNORMAL LOW (ref 38.4–49.9)
HGB: 9.8 g/dL — ABNORMAL LOW (ref 13.0–17.1)
MCH: 33 pg (ref 27.2–33.4)
MONO#: 0.5 10*3/uL (ref 0.1–0.9)
RDW: 15.1 % — ABNORMAL HIGH (ref 11.0–14.6)
WBC: 2.6 10*3/uL — ABNORMAL LOW (ref 4.0–10.3)
lymph#: 0.2 10*3/uL — ABNORMAL LOW (ref 0.9–3.3)

## 2011-02-19 NOTE — Discharge Summary (Signed)
NAME:  Russell Williamson, Russell Williamson            ACCOUNT NO.:  [id number]   MEDICAL RECORD NO.:  [id number]          PATIENT TYPE:  INP   LOCATION:  1530                         FACILITY:  [facility]   PHYSICIAN:  Theodosia Paling, MD    DATE OF BIRTH:  03/30/1956   DATE OF ADMISSION:  03/28/2009  DATE OF DISCHARGE:  03/31/2009                               DISCHARGE SUMMARY   PRIMARY CARE PHYSICIAN:  Della Goo, M.D.   GI SPECIALIST:  Petra Kuba, M.D.   DISCHARGE DIAGNOSIS:  1. Biliary strictures with malfunctioning stent, status post      endoscopic retrograde cholangiopancreatography, replaced with the      removal of metal stent.  2. Transaminitis due to #1 above.  3. Hypokalemia.   SECONDARY DIAGNOSES:  1. History of chronic pancreatitis.  2. History of hypertension.  3. History of alcohol abuse.   DISCHARGE MEDICATIONS:  New medication added:  Ciprofloxacin 500 mg  daily 12 hours for 3 days.   HOME MEDICATIONS CONTINUED:  1. Opana Extended Release 10 mg p.o. 12 hours.  2. Oxycodone/acetaminophen 10/325 mg p.o. q.6h. p.r.n.  3. Cymbalta 60 mg daily.  4. Metoprolol 50 mg daily p.o. q.12h.  5. Creon 12,000 units p.o. q.6h.  6. Vitamin D 1 capsule p.o. daily.  7. Amlodipine 10 mg p.o. daily.  8. Omeprazole 20 mg p.o. daily.  9. Zolpidem 10 mg p.o. q.h.s.   HOSPITAL COURSE:  The following issues were addressed during the  hospitalization.  1. Malfunctioning stent:  The patient presented with abdominal pain.      He had transaminitis associated with that.  A GI consultation was      obtained on March 28, 2009 by Dr. Matthias Hughs.  The patient underwent      ERCP by Dr. Madilyn Fireman on March 29, 2009, where the malfunctioning stent      was replaced with removal of metal stent.  The patient tolerated      the procedure very well.  He had resolution of the liver function      tests, and abdominal pain was also significantly better.  The      patient did not have any fever or chills.  Received  ciprofloxacin,      total of 48 hours.  He was to complete 3 days as an outpatient.      The patient was planned to follow with Dr. Ewing Schlein as an outpatient.      On April 30, 2009 at 10:10 in the morning, at the time of discharge,      the patient is free of nausea, is able to keep the food down, and      his pain is well-controlled with p.o. pain medications.  2. Hyperkalemia.  The patient was found to be hyperkalemic during the      hospitalization.  It was repleted.  3. Hypertension.  The patient's hypertension stayed rate-controlled.   PROCEDURES PERFORMED:  As mentioned under hospital course.   CONSULTATION:  Performed as mentioned in hospital course.   IMAGING PERFORMED:  On March 28, 2009, the patient underwent CT scan of  the abdomen, showing stable biliary ductal dilation, despite placement  of biliary stent and increased renal atelectasis and scarring of the  right lung base.   DISPOSITION:  1. The patient will follow up with Dr. Ewing Schlein as an outpatient on April 30, 2009 at 10:00 in the morning.  2. Patient will follow up with the primary care physician, Dr.      Della Goo in 1 week's time.   Thanks so much for this dictation.  Total time spent in discharge of  this patient around 1 hour.      Theodosia Paling, MD  Electronically Signed     NP/MEDQ  D:  04/08/2009  T:  04/08/2009  Job:  [id number]   cc:   Petra Kuba, M.D.  Fax: [phone]   Della Goo, M.D.  Fax: [phone]

## 2011-02-22 ENCOUNTER — Encounter (HOSPITAL_BASED_OUTPATIENT_CLINIC_OR_DEPARTMENT_OTHER): Payer: Medicaid Other | Admitting: Oncology

## 2011-02-22 DIAGNOSIS — C099 Malignant neoplasm of tonsil, unspecified: Secondary | ICD-10-CM

## 2011-02-22 DIAGNOSIS — Z5111 Encounter for antineoplastic chemotherapy: Secondary | ICD-10-CM

## 2011-02-24 ENCOUNTER — Encounter (HOSPITAL_BASED_OUTPATIENT_CLINIC_OR_DEPARTMENT_OTHER): Payer: Medicaid Other | Admitting: Oncology

## 2011-02-24 DIAGNOSIS — C099 Malignant neoplasm of tonsil, unspecified: Secondary | ICD-10-CM

## 2011-02-24 DIAGNOSIS — Z452 Encounter for adjustment and management of vascular access device: Secondary | ICD-10-CM

## 2011-02-26 ENCOUNTER — Encounter (HOSPITAL_BASED_OUTPATIENT_CLINIC_OR_DEPARTMENT_OTHER): Payer: Medicaid Other | Admitting: Oncology

## 2011-02-26 DIAGNOSIS — C099 Malignant neoplasm of tonsil, unspecified: Secondary | ICD-10-CM

## 2011-02-26 DIAGNOSIS — Z452 Encounter for adjustment and management of vascular access device: Secondary | ICD-10-CM

## 2011-02-28 ENCOUNTER — Encounter: Payer: Medicaid Other | Admitting: Oncology

## 2011-03-02 ENCOUNTER — Encounter (HOSPITAL_BASED_OUTPATIENT_CLINIC_OR_DEPARTMENT_OTHER): Payer: Medicaid Other | Admitting: Oncology

## 2011-03-02 ENCOUNTER — Other Ambulatory Visit: Payer: Self-pay | Admitting: Oncology

## 2011-03-02 DIAGNOSIS — G8929 Other chronic pain: Secondary | ICD-10-CM

## 2011-03-02 DIAGNOSIS — C099 Malignant neoplasm of tonsil, unspecified: Secondary | ICD-10-CM

## 2011-03-02 DIAGNOSIS — D61818 Other pancytopenia: Secondary | ICD-10-CM

## 2011-03-02 DIAGNOSIS — E46 Unspecified protein-calorie malnutrition: Secondary | ICD-10-CM

## 2011-03-02 LAB — CBC WITH DIFFERENTIAL/PLATELET
BASO%: 0 % (ref 0.0–2.0)
LYMPH%: 16.4 % (ref 14.0–49.0)
MCHC: 32.6 g/dL (ref 32.0–36.0)
MONO#: 0.8 10*3/uL (ref 0.1–0.9)
Platelets: 81 10*3/uL — ABNORMAL LOW (ref 140–400)
RBC: 2.92 10*6/uL — ABNORMAL LOW (ref 4.20–5.82)
WBC: 2.3 10*3/uL — ABNORMAL LOW (ref 4.0–10.3)
lymph#: 0.4 10*3/uL — ABNORMAL LOW (ref 0.9–3.3)
nRBC: 0 % (ref 0–0)

## 2011-03-03 ENCOUNTER — Encounter (HOSPITAL_BASED_OUTPATIENT_CLINIC_OR_DEPARTMENT_OTHER): Payer: Medicaid Other | Admitting: Oncology

## 2011-03-03 ENCOUNTER — Other Ambulatory Visit: Payer: Self-pay | Admitting: Oncology

## 2011-03-03 DIAGNOSIS — Z5111 Encounter for antineoplastic chemotherapy: Secondary | ICD-10-CM

## 2011-03-03 DIAGNOSIS — C099 Malignant neoplasm of tonsil, unspecified: Secondary | ICD-10-CM

## 2011-03-03 DIAGNOSIS — Z452 Encounter for adjustment and management of vascular access device: Secondary | ICD-10-CM

## 2011-03-03 LAB — COMPREHENSIVE METABOLIC PANEL
ALT: 13 U/L (ref 0–53)
AST: 18 U/L (ref 0–37)
Calcium: 9.4 mg/dL (ref 8.4–10.5)
Chloride: 99 mEq/L (ref 96–112)
Creatinine, Ser: 0.6 mg/dL (ref 0.40–1.50)

## 2011-03-05 ENCOUNTER — Emergency Department (HOSPITAL_COMMUNITY): Payer: Medicaid Other

## 2011-03-05 ENCOUNTER — Emergency Department (HOSPITAL_COMMUNITY)
Admission: EM | Admit: 2011-03-05 | Discharge: 2011-03-05 | Disposition: A | Discharge status: Home / Self Care | Payer: Medicaid Other | Attending: Emergency Medicine | Admitting: Emergency Medicine

## 2011-03-05 DIAGNOSIS — Z79899 Other long term (current) drug therapy: Secondary | ICD-10-CM | POA: Insufficient documentation

## 2011-03-05 DIAGNOSIS — R109 Unspecified abdominal pain: Secondary | ICD-10-CM | POA: Insufficient documentation

## 2011-03-05 DIAGNOSIS — Z923 Personal history of irradiation: Secondary | ICD-10-CM | POA: Insufficient documentation

## 2011-03-05 DIAGNOSIS — I10 Essential (primary) hypertension: Secondary | ICD-10-CM | POA: Insufficient documentation

## 2011-03-05 DIAGNOSIS — D709 Neutropenia, unspecified: Secondary | ICD-10-CM | POA: Insufficient documentation

## 2011-03-05 DIAGNOSIS — C140 Malignant neoplasm of pharynx, unspecified: Secondary | ICD-10-CM | POA: Insufficient documentation

## 2011-03-05 LAB — URINALYSIS, ROUTINE W REFLEX MICROSCOPIC
Bilirubin Urine: NEGATIVE
Hgb urine dipstick: NEGATIVE
Nitrite: NEGATIVE
Protein, ur: NEGATIVE mg/dL
Specific Gravity, Urine: 1.006 (ref 1.005–1.030)
Urobilinogen, UA: 1 mg/dL (ref 0.0–1.0)

## 2011-03-05 LAB — COMPREHENSIVE METABOLIC PANEL
Alkaline Phosphatase: 78 U/L (ref 39–117)
BUN: 13 mg/dL (ref 6–23)
CO2: 31 mEq/L (ref 19–32)
Chloride: 99 mEq/L (ref 96–112)
GFR calc non Af Amer: >60 mL/min (ref >60)
Glucose, Bld: 78 mg/dL (ref 70–99)
Potassium: 4.3 mEq/L (ref 3.5–5.1)
Total Bilirubin: 0.2 mg/dL — ABNORMAL LOW (ref 0.3–1.2)

## 2011-03-05 LAB — DIFFERENTIAL
Basophils Absolute: 0 10*3/uL (ref 0.0–0.1)
Basophils Relative: 0 % (ref 0–1)
Eosinophils Relative: 2 % (ref 0–5)
Lymphocytes Relative: 27 % (ref 12–46)
Neutro Abs: 0.9 10*3/uL — ABNORMAL LOW (ref 1.7–7.7)

## 2011-03-05 LAB — CBC
MCHC: 32.9 g/dL (ref 30.0–36.0)
MCV: 98.6 fL (ref 78.0–100.0)
Platelets: 77 10*3/uL — ABNORMAL LOW (ref 150–400)
RDW: 16.6 % — ABNORMAL HIGH (ref 11.5–15.5)
WBC: 1.8 10*3/uL — ABNORMAL LOW (ref 4.0–10.5)

## 2011-03-05 LAB — LIPASE, BLOOD: Lipase: 20 U/L (ref 11–59)

## 2011-03-08 ENCOUNTER — Encounter (HOSPITAL_BASED_OUTPATIENT_CLINIC_OR_DEPARTMENT_OTHER): Payer: Medicaid Other | Admitting: Oncology

## 2011-03-08 DIAGNOSIS — C099 Malignant neoplasm of tonsil, unspecified: Secondary | ICD-10-CM

## 2011-03-08 DIAGNOSIS — Z452 Encounter for adjustment and management of vascular access device: Secondary | ICD-10-CM

## 2011-03-09 ENCOUNTER — Encounter (HOSPITAL_BASED_OUTPATIENT_CLINIC_OR_DEPARTMENT_OTHER): Payer: Medicaid Other | Admitting: Oncology

## 2011-03-09 ENCOUNTER — Other Ambulatory Visit: Payer: Self-pay | Admitting: Oncology

## 2011-03-09 DIAGNOSIS — Z452 Encounter for adjustment and management of vascular access device: Secondary | ICD-10-CM

## 2011-03-09 DIAGNOSIS — C099 Malignant neoplasm of tonsil, unspecified: Secondary | ICD-10-CM

## 2011-03-09 LAB — CBC WITH DIFFERENTIAL/PLATELET
BASO%: 0.4 % (ref 0.0–2.0)
EOS%: 3 % (ref 0.0–7.0)
HCT: 29.5 % — ABNORMAL LOW (ref 38.4–49.9)
LYMPH%: 14.9 % (ref 14.0–49.0)
MCH: 34.2 pg — ABNORMAL HIGH (ref 27.2–33.4)
MCHC: 33.6 g/dL (ref 32.0–36.0)
MCV: 101.7 fL — ABNORMAL HIGH (ref 79.3–98.0)
MONO#: 0.3 10*3/uL (ref 0.1–0.9)
MONO%: 26.4 % — ABNORMAL HIGH (ref 0.0–14.0)
NEUT%: 55.3 % (ref 39.0–75.0)
Platelets: 68 10*3/uL — ABNORMAL LOW (ref 140–400)
RBC: 2.89 10*6/uL — ABNORMAL LOW (ref 4.20–5.82)
WBC: 1.3 10*3/uL — ABNORMAL LOW (ref 4.0–10.3)

## 2011-03-09 LAB — COMPREHENSIVE METABOLIC PANEL
ALT: 13 U/L (ref 0–53)
Alkaline Phosphatase: 83 U/L (ref 39–117)
Creatinine, Ser: 0.75 mg/dL (ref 0.50–1.35)
Sodium: 137 mEq/L (ref 135–145)
Total Bilirubin: 0.2 mg/dL — ABNORMAL LOW (ref 0.3–1.2)
Total Protein: 7.5 g/dL (ref 6.0–8.3)

## 2011-03-09 LAB — MAGNESIUM: Magnesium: 2.2 mg/dL (ref 1.5–2.5)

## 2011-03-10 ENCOUNTER — Encounter (HOSPITAL_BASED_OUTPATIENT_CLINIC_OR_DEPARTMENT_OTHER): Payer: Medicaid Other | Admitting: Oncology

## 2011-03-10 DIAGNOSIS — C099 Malignant neoplasm of tonsil, unspecified: Secondary | ICD-10-CM

## 2011-03-10 DIAGNOSIS — D61818 Other pancytopenia: Secondary | ICD-10-CM

## 2011-03-10 DIAGNOSIS — E039 Hypothyroidism, unspecified: Secondary | ICD-10-CM

## 2011-03-10 DIAGNOSIS — G8929 Other chronic pain: Secondary | ICD-10-CM

## 2011-03-12 ENCOUNTER — Encounter (HOSPITAL_BASED_OUTPATIENT_CLINIC_OR_DEPARTMENT_OTHER): Payer: Medicaid Other | Admitting: Oncology

## 2011-03-12 DIAGNOSIS — Z452 Encounter for adjustment and management of vascular access device: Secondary | ICD-10-CM

## 2011-03-12 DIAGNOSIS — C099 Malignant neoplasm of tonsil, unspecified: Secondary | ICD-10-CM

## 2011-03-15 ENCOUNTER — Encounter (HOSPITAL_BASED_OUTPATIENT_CLINIC_OR_DEPARTMENT_OTHER): Payer: Medicaid Other | Admitting: Oncology

## 2011-03-15 DIAGNOSIS — Z452 Encounter for adjustment and management of vascular access device: Secondary | ICD-10-CM

## 2011-03-15 DIAGNOSIS — C099 Malignant neoplasm of tonsil, unspecified: Secondary | ICD-10-CM

## 2011-03-16 ENCOUNTER — Other Ambulatory Visit: Payer: Self-pay | Admitting: Oncology

## 2011-03-16 ENCOUNTER — Encounter (HOSPITAL_BASED_OUTPATIENT_CLINIC_OR_DEPARTMENT_OTHER): Payer: Medicaid Other | Admitting: Oncology

## 2011-03-16 ENCOUNTER — Ambulatory Visit (HOSPITAL_COMMUNITY)
Admission: RE | Admit: 2011-03-16 | Discharge: 2011-03-16 | Disposition: A | Discharge status: Home / Self Care | Payer: Medicaid Other | Source: Ambulatory Visit | Attending: Oncology | Admitting: Oncology

## 2011-03-16 DIAGNOSIS — Z452 Encounter for adjustment and management of vascular access device: Secondary | ICD-10-CM

## 2011-03-16 DIAGNOSIS — E039 Hypothyroidism, unspecified: Secondary | ICD-10-CM

## 2011-03-16 DIAGNOSIS — Z5111 Encounter for antineoplastic chemotherapy: Secondary | ICD-10-CM

## 2011-03-16 DIAGNOSIS — C099 Malignant neoplasm of tonsil, unspecified: Secondary | ICD-10-CM

## 2011-03-16 DIAGNOSIS — D61818 Other pancytopenia: Secondary | ICD-10-CM

## 2011-03-16 DIAGNOSIS — G8929 Other chronic pain: Secondary | ICD-10-CM

## 2011-03-16 LAB — CBC WITH DIFFERENTIAL/PLATELET
Eosinophils Absolute: 0.1 10*3/uL (ref 0.0–0.5)
HGB: 9.1 g/dL — ABNORMAL LOW (ref 13.0–17.1)
MCV: 100.4 fL — ABNORMAL HIGH (ref 79.3–98.0)
MONO%: 19.7 % — ABNORMAL HIGH (ref 0.0–14.0)
RBC: 2.79 10*6/uL — ABNORMAL LOW (ref 4.20–5.82)
RDW: 18.6 % — ABNORMAL HIGH (ref 11.0–14.6)
nRBC: 0 % (ref 0–0)

## 2011-03-16 LAB — COMPREHENSIVE METABOLIC PANEL
ALT: 14 U/L (ref 0–53)
AST: 17 U/L (ref 0–37)
Albumin: 4.3 g/dL (ref 3.5–5.2)
Alkaline Phosphatase: 74 U/L (ref 39–117)
BUN: 17 mg/dL (ref 6–23)
CO2: 27 mEq/L (ref 19–32)
Calcium: 9 mg/dL (ref 8.4–10.5)
Chloride: 101 mEq/L (ref 96–112)
Creatinine, Ser: 0.62 mg/dL (ref 0.50–1.35)
Glucose, Bld: 94 mg/dL (ref 70–99)
Potassium: 4.2 mEq/L (ref 3.5–5.3)
Sodium: 138 mEq/L (ref 135–145)
Total Bilirubin: 0.3 mg/dL (ref 0.3–1.2)
Total Protein: 7.3 g/dL (ref 6.0–8.3)

## 2011-03-16 LAB — MAGNESIUM: Magnesium: 1.9 mg/dL (ref 1.5–2.5)

## 2011-03-17 ENCOUNTER — Encounter (HOSPITAL_BASED_OUTPATIENT_CLINIC_OR_DEPARTMENT_OTHER): Payer: Medicaid Other | Admitting: Oncology

## 2011-03-17 DIAGNOSIS — Z452 Encounter for adjustment and management of vascular access device: Secondary | ICD-10-CM

## 2011-03-17 DIAGNOSIS — C099 Malignant neoplasm of tonsil, unspecified: Secondary | ICD-10-CM

## 2011-03-19 ENCOUNTER — Encounter (HOSPITAL_BASED_OUTPATIENT_CLINIC_OR_DEPARTMENT_OTHER): Payer: Medicaid Other | Admitting: Oncology

## 2011-03-19 DIAGNOSIS — C099 Malignant neoplasm of tonsil, unspecified: Secondary | ICD-10-CM

## 2011-03-19 DIAGNOSIS — Z452 Encounter for adjustment and management of vascular access device: Secondary | ICD-10-CM

## 2011-03-22 ENCOUNTER — Encounter (HOSPITAL_BASED_OUTPATIENT_CLINIC_OR_DEPARTMENT_OTHER): Payer: Medicaid Other | Admitting: Oncology

## 2011-03-22 DIAGNOSIS — Z452 Encounter for adjustment and management of vascular access device: Secondary | ICD-10-CM

## 2011-03-22 DIAGNOSIS — C099 Malignant neoplasm of tonsil, unspecified: Secondary | ICD-10-CM

## 2011-03-24 ENCOUNTER — Ambulatory Visit: Payer: Medicaid Other | Attending: Oncology

## 2011-03-24 ENCOUNTER — Encounter (HOSPITAL_BASED_OUTPATIENT_CLINIC_OR_DEPARTMENT_OTHER): Payer: Medicaid Other | Admitting: Oncology

## 2011-03-24 DIAGNOSIS — G8929 Other chronic pain: Secondary | ICD-10-CM

## 2011-03-24 DIAGNOSIS — IMO0001 Reserved for inherently not codable concepts without codable children: Secondary | ICD-10-CM | POA: Insufficient documentation

## 2011-03-24 DIAGNOSIS — C099 Malignant neoplasm of tonsil, unspecified: Secondary | ICD-10-CM

## 2011-03-24 DIAGNOSIS — E46 Unspecified protein-calorie malnutrition: Secondary | ICD-10-CM

## 2011-03-24 DIAGNOSIS — D61818 Other pancytopenia: Secondary | ICD-10-CM

## 2011-03-24 DIAGNOSIS — R1310 Dysphagia, unspecified: Secondary | ICD-10-CM | POA: Insufficient documentation

## 2011-03-26 ENCOUNTER — Encounter (HOSPITAL_BASED_OUTPATIENT_CLINIC_OR_DEPARTMENT_OTHER): Payer: Medicaid Other | Admitting: Oncology

## 2011-03-26 DIAGNOSIS — E46 Unspecified protein-calorie malnutrition: Secondary | ICD-10-CM

## 2011-03-26 DIAGNOSIS — D61818 Other pancytopenia: Secondary | ICD-10-CM

## 2011-03-26 DIAGNOSIS — G8929 Other chronic pain: Secondary | ICD-10-CM

## 2011-03-26 DIAGNOSIS — C099 Malignant neoplasm of tonsil, unspecified: Secondary | ICD-10-CM

## 2011-03-29 ENCOUNTER — Encounter (HOSPITAL_BASED_OUTPATIENT_CLINIC_OR_DEPARTMENT_OTHER): Payer: Medicaid Other | Admitting: Oncology

## 2011-03-29 DIAGNOSIS — C099 Malignant neoplasm of tonsil, unspecified: Secondary | ICD-10-CM

## 2011-03-29 DIAGNOSIS — G8929 Other chronic pain: Secondary | ICD-10-CM

## 2011-03-29 DIAGNOSIS — D61818 Other pancytopenia: Secondary | ICD-10-CM

## 2011-03-29 DIAGNOSIS — E46 Unspecified protein-calorie malnutrition: Secondary | ICD-10-CM

## 2011-04-01 ENCOUNTER — Encounter (HOSPITAL_BASED_OUTPATIENT_CLINIC_OR_DEPARTMENT_OTHER): Payer: Medicaid Other | Admitting: Oncology

## 2011-04-01 ENCOUNTER — Other Ambulatory Visit: Payer: Self-pay | Admitting: Oncology

## 2011-04-01 DIAGNOSIS — E46 Unspecified protein-calorie malnutrition: Secondary | ICD-10-CM

## 2011-04-01 DIAGNOSIS — C099 Malignant neoplasm of tonsil, unspecified: Secondary | ICD-10-CM

## 2011-04-01 DIAGNOSIS — G8929 Other chronic pain: Secondary | ICD-10-CM

## 2011-04-01 DIAGNOSIS — D61818 Other pancytopenia: Secondary | ICD-10-CM

## 2011-04-01 DIAGNOSIS — F17200 Nicotine dependence, unspecified, uncomplicated: Secondary | ICD-10-CM

## 2011-04-01 LAB — CBC WITH DIFFERENTIAL/PLATELET
BASO%: 0 % (ref 0.0–2.0)
EOS%: 1.5 % (ref 0.0–7.0)
HCT: 30.5 % — ABNORMAL LOW (ref 38.4–49.9)
LYMPH%: 21.8 % (ref 14.0–49.0)
MCH: 33.1 pg (ref 27.2–33.4)
MCHC: 32.1 g/dL (ref 32.0–36.0)
MCV: 103 fL — ABNORMAL HIGH (ref 79.3–98.0)
MONO%: 25.5 % — ABNORMAL HIGH (ref 0.0–14.0)
NEUT%: 51.2 % (ref 39.0–75.0)
Platelets: 178 10*3/uL (ref 140–400)

## 2011-04-01 LAB — COMPREHENSIVE METABOLIC PANEL
AST: 24 U/L (ref 0–37)
Alkaline Phosphatase: 85 U/L (ref 39–117)
BUN: 14 mg/dL (ref 6–23)
Creatinine, Ser: 0.52 mg/dL (ref 0.50–1.35)
Glucose, Bld: 88 mg/dL (ref 70–99)

## 2011-04-05 ENCOUNTER — Encounter (HOSPITAL_BASED_OUTPATIENT_CLINIC_OR_DEPARTMENT_OTHER): Payer: Medicaid Other | Admitting: Oncology

## 2011-04-05 DIAGNOSIS — D61818 Other pancytopenia: Secondary | ICD-10-CM

## 2011-04-05 DIAGNOSIS — C099 Malignant neoplasm of tonsil, unspecified: Secondary | ICD-10-CM

## 2011-04-05 DIAGNOSIS — E46 Unspecified protein-calorie malnutrition: Secondary | ICD-10-CM

## 2011-04-05 DIAGNOSIS — G8929 Other chronic pain: Secondary | ICD-10-CM

## 2011-04-09 ENCOUNTER — Encounter (HOSPITAL_BASED_OUTPATIENT_CLINIC_OR_DEPARTMENT_OTHER): Payer: Medicaid Other | Admitting: Oncology

## 2011-04-09 DIAGNOSIS — E039 Hypothyroidism, unspecified: Secondary | ICD-10-CM

## 2011-04-09 DIAGNOSIS — D61818 Other pancytopenia: Secondary | ICD-10-CM

## 2011-04-09 DIAGNOSIS — G8929 Other chronic pain: Secondary | ICD-10-CM

## 2011-04-09 DIAGNOSIS — C099 Malignant neoplasm of tonsil, unspecified: Secondary | ICD-10-CM

## 2011-04-12 ENCOUNTER — Encounter (HOSPITAL_BASED_OUTPATIENT_CLINIC_OR_DEPARTMENT_OTHER): Payer: Medicaid Other | Admitting: Oncology

## 2011-04-12 DIAGNOSIS — Z452 Encounter for adjustment and management of vascular access device: Secondary | ICD-10-CM

## 2011-04-12 DIAGNOSIS — C099 Malignant neoplasm of tonsil, unspecified: Secondary | ICD-10-CM

## 2011-04-14 ENCOUNTER — Encounter (HOSPITAL_BASED_OUTPATIENT_CLINIC_OR_DEPARTMENT_OTHER): Payer: Medicaid Other | Admitting: Oncology

## 2011-04-14 DIAGNOSIS — Z452 Encounter for adjustment and management of vascular access device: Secondary | ICD-10-CM

## 2011-04-14 DIAGNOSIS — C099 Malignant neoplasm of tonsil, unspecified: Secondary | ICD-10-CM

## 2011-04-16 ENCOUNTER — Other Ambulatory Visit: Payer: Self-pay | Admitting: Radiation Oncology

## 2011-04-16 ENCOUNTER — Encounter: Payer: Medicaid Other | Admitting: Oncology

## 2011-04-16 ENCOUNTER — Ambulatory Visit
Admission: RE | Admit: 2011-04-16 | Discharge: 2011-04-16 | Disposition: A | Discharge status: Home / Self Care | Payer: Medicaid Other | Source: Ambulatory Visit | Attending: Radiation Oncology | Admitting: Radiation Oncology

## 2011-04-19 ENCOUNTER — Encounter (HOSPITAL_BASED_OUTPATIENT_CLINIC_OR_DEPARTMENT_OTHER): Payer: Medicaid Other | Admitting: Oncology

## 2011-04-19 DIAGNOSIS — C099 Malignant neoplasm of tonsil, unspecified: Secondary | ICD-10-CM

## 2011-04-19 DIAGNOSIS — Z452 Encounter for adjustment and management of vascular access device: Secondary | ICD-10-CM

## 2011-04-20 DIAGNOSIS — K08109 Complete loss of teeth, unspecified cause, unspecified class: Secondary | ICD-10-CM

## 2011-04-20 DIAGNOSIS — K117 Disturbances of salivary secretion: Secondary | ICD-10-CM

## 2011-04-20 LAB — WOUND CULTURE

## 2011-04-21 ENCOUNTER — Encounter (HOSPITAL_BASED_OUTPATIENT_CLINIC_OR_DEPARTMENT_OTHER): Payer: Medicaid Other | Admitting: Oncology

## 2011-04-21 DIAGNOSIS — C099 Malignant neoplasm of tonsil, unspecified: Secondary | ICD-10-CM

## 2011-04-21 DIAGNOSIS — Z452 Encounter for adjustment and management of vascular access device: Secondary | ICD-10-CM

## 2011-04-22 ENCOUNTER — Ambulatory Visit: Payer: Medicaid Other | Attending: Oncology

## 2011-04-22 DIAGNOSIS — R1310 Dysphagia, unspecified: Secondary | ICD-10-CM | POA: Insufficient documentation

## 2011-04-22 DIAGNOSIS — IMO0001 Reserved for inherently not codable concepts without codable children: Secondary | ICD-10-CM | POA: Insufficient documentation

## 2011-04-23 ENCOUNTER — Encounter (HOSPITAL_BASED_OUTPATIENT_CLINIC_OR_DEPARTMENT_OTHER): Payer: Medicaid Other | Admitting: Oncology

## 2011-04-23 DIAGNOSIS — Z452 Encounter for adjustment and management of vascular access device: Secondary | ICD-10-CM

## 2011-04-23 DIAGNOSIS — C099 Malignant neoplasm of tonsil, unspecified: Secondary | ICD-10-CM

## 2011-04-26 ENCOUNTER — Encounter (HOSPITAL_BASED_OUTPATIENT_CLINIC_OR_DEPARTMENT_OTHER): Payer: Medicaid Other | Admitting: Oncology

## 2011-04-26 DIAGNOSIS — C099 Malignant neoplasm of tonsil, unspecified: Secondary | ICD-10-CM

## 2011-04-26 DIAGNOSIS — Z452 Encounter for adjustment and management of vascular access device: Secondary | ICD-10-CM

## 2011-04-28 ENCOUNTER — Encounter (HOSPITAL_BASED_OUTPATIENT_CLINIC_OR_DEPARTMENT_OTHER): Payer: Medicaid Other | Admitting: Oncology

## 2011-04-28 DIAGNOSIS — C099 Malignant neoplasm of tonsil, unspecified: Secondary | ICD-10-CM

## 2011-04-28 DIAGNOSIS — Z452 Encounter for adjustment and management of vascular access device: Secondary | ICD-10-CM

## 2011-04-30 ENCOUNTER — Encounter (HOSPITAL_BASED_OUTPATIENT_CLINIC_OR_DEPARTMENT_OTHER): Payer: Medicaid Other | Admitting: Oncology

## 2011-04-30 DIAGNOSIS — Z452 Encounter for adjustment and management of vascular access device: Secondary | ICD-10-CM

## 2011-04-30 DIAGNOSIS — C099 Malignant neoplasm of tonsil, unspecified: Secondary | ICD-10-CM

## 2011-05-03 ENCOUNTER — Encounter (HOSPITAL_BASED_OUTPATIENT_CLINIC_OR_DEPARTMENT_OTHER): Payer: Medicaid Other | Admitting: Oncology

## 2011-05-03 DIAGNOSIS — Z452 Encounter for adjustment and management of vascular access device: Secondary | ICD-10-CM

## 2011-05-03 DIAGNOSIS — C099 Malignant neoplasm of tonsil, unspecified: Secondary | ICD-10-CM

## 2011-05-05 ENCOUNTER — Encounter (HOSPITAL_BASED_OUTPATIENT_CLINIC_OR_DEPARTMENT_OTHER): Payer: Medicaid Other | Admitting: Oncology

## 2011-05-05 DIAGNOSIS — Z452 Encounter for adjustment and management of vascular access device: Secondary | ICD-10-CM

## 2011-05-05 DIAGNOSIS — C099 Malignant neoplasm of tonsil, unspecified: Secondary | ICD-10-CM

## 2011-05-07 ENCOUNTER — Encounter (HOSPITAL_BASED_OUTPATIENT_CLINIC_OR_DEPARTMENT_OTHER): Payer: Medicaid Other | Admitting: Oncology

## 2011-05-07 DIAGNOSIS — C099 Malignant neoplasm of tonsil, unspecified: Secondary | ICD-10-CM

## 2011-05-07 DIAGNOSIS — Z452 Encounter for adjustment and management of vascular access device: Secondary | ICD-10-CM

## 2011-05-10 ENCOUNTER — Encounter (HOSPITAL_BASED_OUTPATIENT_CLINIC_OR_DEPARTMENT_OTHER): Payer: Medicaid Other | Admitting: Oncology

## 2011-05-10 DIAGNOSIS — Z452 Encounter for adjustment and management of vascular access device: Secondary | ICD-10-CM

## 2011-05-10 DIAGNOSIS — C099 Malignant neoplasm of tonsil, unspecified: Secondary | ICD-10-CM

## 2011-05-12 ENCOUNTER — Encounter (HOSPITAL_BASED_OUTPATIENT_CLINIC_OR_DEPARTMENT_OTHER): Payer: Medicaid Other | Admitting: Oncology

## 2011-05-14 ENCOUNTER — Ambulatory Visit
Admission: RE | Admit: 2011-05-14 | Discharge: 2011-05-14 | Disposition: A | Discharge status: Home / Self Care | Payer: Medicaid Other | Source: Ambulatory Visit | Attending: Radiation Oncology | Admitting: Radiation Oncology

## 2011-05-14 DIAGNOSIS — C099 Malignant neoplasm of tonsil, unspecified: Secondary | ICD-10-CM

## 2011-05-14 DIAGNOSIS — Z452 Encounter for adjustment and management of vascular access device: Secondary | ICD-10-CM

## 2011-05-15 ENCOUNTER — Emergency Department (HOSPITAL_COMMUNITY)
Admission: EM | Admit: 2011-05-15 | Discharge: 2011-05-16 | Disposition: A | Discharge status: Home / Self Care | Payer: Medicaid Other | Attending: Emergency Medicine | Admitting: Emergency Medicine

## 2011-05-15 ENCOUNTER — Emergency Department (HOSPITAL_COMMUNITY): Payer: Medicaid Other

## 2011-05-15 DIAGNOSIS — I10 Essential (primary) hypertension: Secondary | ICD-10-CM | POA: Insufficient documentation

## 2011-05-15 DIAGNOSIS — R220 Localized swelling, mass and lump, head: Secondary | ICD-10-CM | POA: Insufficient documentation

## 2011-05-15 DIAGNOSIS — C801 Malignant (primary) neoplasm, unspecified: Secondary | ICD-10-CM | POA: Insufficient documentation

## 2011-05-15 DIAGNOSIS — R1310 Dysphagia, unspecified: Secondary | ICD-10-CM | POA: Insufficient documentation

## 2011-05-15 DIAGNOSIS — Z931 Gastrostomy status: Secondary | ICD-10-CM | POA: Insufficient documentation

## 2011-05-15 DIAGNOSIS — C140 Malignant neoplasm of pharynx, unspecified: Secondary | ICD-10-CM | POA: Insufficient documentation

## 2011-05-15 LAB — POCT I-STAT, CHEM 8
BUN: 14 mg/dL (ref 6–23)
Creatinine, Ser: 0.8 mg/dL (ref 0.50–1.35)
Hemoglobin: 11.6 g/dL — ABNORMAL LOW (ref 13.0–17.0)
Potassium: 4.3 mEq/L (ref 3.5–5.1)
Sodium: 138 mEq/L (ref 135–145)

## 2011-05-16 LAB — DIFFERENTIAL
Basophils Absolute: 0 10*3/uL (ref 0.0–0.1)
Basophils Relative: 0 % (ref 0–1)
Lymphocytes Relative: 13 % (ref 12–46)
Monocytes Absolute: 0.8 10*3/uL (ref 0.1–1.0)
Neutro Abs: 2.2 10*3/uL (ref 1.7–7.7)
Neutrophils Relative %: 61 % (ref 43–77)

## 2011-05-16 LAB — CBC
HCT: 31 % — ABNORMAL LOW (ref 39.0–52.0)
Hemoglobin: 10.4 g/dL — ABNORMAL LOW (ref 13.0–17.0)
MCHC: 33.5 g/dL (ref 30.0–36.0)
RBC: 3.01 MIL/uL — ABNORMAL LOW (ref 4.22–5.81)

## 2011-05-16 MED ORDER — IOHEXOL 300 MG/ML  SOLN
100.0000 mL | Freq: Once | INTRAMUSCULAR | Status: AC | PRN
  Administered 2011-05-16: 100 mL via INTRAVENOUS

## 2011-05-17 ENCOUNTER — Encounter: Payer: Medicaid Other | Admitting: Oncology

## 2011-05-19 ENCOUNTER — Encounter: Payer: Medicaid Other | Admitting: Oncology

## 2011-05-21 ENCOUNTER — Encounter: Payer: Medicaid Other | Admitting: Oncology

## 2011-05-24 ENCOUNTER — Encounter: Payer: Medicaid Other | Admitting: Oncology

## 2011-05-25 ENCOUNTER — Encounter (HOSPITAL_COMMUNITY)
Admission: RE | Admit: 2011-05-25 | Discharge: 2011-05-25 | Disposition: A | Discharge status: Home / Self Care | Payer: Medicaid Other | Source: Ambulatory Visit | Attending: Oncology | Admitting: Oncology

## 2011-05-25 DIAGNOSIS — C099 Malignant neoplasm of tonsil, unspecified: Secondary | ICD-10-CM | POA: Insufficient documentation

## 2011-05-25 LAB — GLUCOSE, CAPILLARY: Glucose-Capillary: 98 mg/dL (ref 70–99)

## 2011-05-25 MED ORDER — FLUDEOXYGLUCOSE F - 18 (FDG) INJECTION
15.5000 | Freq: Once | INTRAVENOUS | Status: AC | PRN
  Administered 2011-05-25: 15.5 via INTRAVENOUS

## 2011-05-28 ENCOUNTER — Other Ambulatory Visit: Payer: Self-pay | Admitting: Oncology

## 2011-05-28 ENCOUNTER — Encounter (HOSPITAL_BASED_OUTPATIENT_CLINIC_OR_DEPARTMENT_OTHER): Payer: Medicaid Other | Admitting: Oncology

## 2011-05-28 DIAGNOSIS — Z452 Encounter for adjustment and management of vascular access device: Secondary | ICD-10-CM

## 2011-05-28 DIAGNOSIS — C099 Malignant neoplasm of tonsil, unspecified: Secondary | ICD-10-CM

## 2011-05-31 ENCOUNTER — Encounter (HOSPITAL_BASED_OUTPATIENT_CLINIC_OR_DEPARTMENT_OTHER): Payer: Medicaid Other | Admitting: Oncology

## 2011-05-31 DIAGNOSIS — C099 Malignant neoplasm of tonsil, unspecified: Secondary | ICD-10-CM

## 2011-05-31 DIAGNOSIS — Z452 Encounter for adjustment and management of vascular access device: Secondary | ICD-10-CM

## 2011-06-02 ENCOUNTER — Encounter (HOSPITAL_BASED_OUTPATIENT_CLINIC_OR_DEPARTMENT_OTHER): Payer: Medicaid Other | Admitting: Oncology

## 2011-06-02 ENCOUNTER — Other Ambulatory Visit: Payer: Self-pay | Admitting: Oncology

## 2011-06-02 DIAGNOSIS — C099 Malignant neoplasm of tonsil, unspecified: Secondary | ICD-10-CM

## 2011-06-02 DIAGNOSIS — R222 Localized swelling, mass and lump, trunk: Secondary | ICD-10-CM

## 2011-06-02 DIAGNOSIS — Z452 Encounter for adjustment and management of vascular access device: Secondary | ICD-10-CM

## 2011-06-04 ENCOUNTER — Encounter (HOSPITAL_BASED_OUTPATIENT_CLINIC_OR_DEPARTMENT_OTHER): Payer: Medicaid Other | Admitting: Oncology

## 2011-06-04 DIAGNOSIS — Z452 Encounter for adjustment and management of vascular access device: Secondary | ICD-10-CM

## 2011-06-04 DIAGNOSIS — C099 Malignant neoplasm of tonsil, unspecified: Secondary | ICD-10-CM

## 2011-06-08 ENCOUNTER — Ambulatory Visit (HOSPITAL_COMMUNITY)
Admission: RE | Admit: 2011-06-08 | Discharge: 2011-06-08 | Disposition: A | Discharge status: Home / Self Care | Payer: Medicaid Other | Source: Ambulatory Visit | Attending: Oncology | Admitting: Oncology

## 2011-06-08 ENCOUNTER — Other Ambulatory Visit: Payer: Self-pay | Admitting: Oncology

## 2011-06-08 ENCOUNTER — Ambulatory Visit (HOSPITAL_COMMUNITY)
Admission: RE | Admit: 2011-06-08 | Discharge: 2011-06-08 | Payer: Medicaid Other | Source: Ambulatory Visit | Attending: Oncology | Admitting: Oncology

## 2011-06-08 DIAGNOSIS — R222 Localized swelling, mass and lump, trunk: Secondary | ICD-10-CM | POA: Insufficient documentation

## 2011-06-08 DIAGNOSIS — K219 Gastro-esophageal reflux disease without esophagitis: Secondary | ICD-10-CM | POA: Insufficient documentation

## 2011-06-08 DIAGNOSIS — Z79899 Other long term (current) drug therapy: Secondary | ICD-10-CM | POA: Insufficient documentation

## 2011-06-08 DIAGNOSIS — C099 Malignant neoplasm of tonsil, unspecified: Secondary | ICD-10-CM | POA: Insufficient documentation

## 2011-06-08 DIAGNOSIS — I10 Essential (primary) hypertension: Secondary | ICD-10-CM | POA: Insufficient documentation

## 2011-06-08 LAB — CBC
HCT: 33.2 % — ABNORMAL LOW (ref 39.0–52.0)
MCHC: 32.2 g/dL (ref 30.0–36.0)
MCV: 104.1 fL — ABNORMAL HIGH (ref 78.0–100.0)
Platelets: 139 10*3/uL — ABNORMAL LOW (ref 150–400)
RDW: 13.7 % (ref 11.5–15.5)
WBC: 3.4 10*3/uL — ABNORMAL LOW (ref 4.0–10.5)

## 2011-06-08 LAB — APTT: aPTT: 29 seconds (ref 24–37)

## 2011-06-09 ENCOUNTER — Other Ambulatory Visit: Payer: Self-pay | Admitting: Oncology

## 2011-06-09 ENCOUNTER — Encounter (HOSPITAL_BASED_OUTPATIENT_CLINIC_OR_DEPARTMENT_OTHER): Payer: Medicaid Other | Admitting: Oncology

## 2011-06-09 ENCOUNTER — Ambulatory Visit (HOSPITAL_COMMUNITY)
Admission: RE | Admit: 2011-06-09 | Discharge: 2011-06-09 | Disposition: A | Discharge status: Home / Self Care | Payer: Medicaid Other | Source: Ambulatory Visit | Attending: Oncology | Admitting: Oncology

## 2011-06-09 DIAGNOSIS — C099 Malignant neoplasm of tonsil, unspecified: Secondary | ICD-10-CM

## 2011-06-09 DIAGNOSIS — Z452 Encounter for adjustment and management of vascular access device: Secondary | ICD-10-CM

## 2011-06-09 DIAGNOSIS — R6889 Other general symptoms and signs: Secondary | ICD-10-CM | POA: Insufficient documentation

## 2011-06-11 ENCOUNTER — Encounter (HOSPITAL_BASED_OUTPATIENT_CLINIC_OR_DEPARTMENT_OTHER): Payer: Medicaid Other | Admitting: Oncology

## 2011-06-11 DIAGNOSIS — Z452 Encounter for adjustment and management of vascular access device: Secondary | ICD-10-CM

## 2011-06-11 DIAGNOSIS — C099 Malignant neoplasm of tonsil, unspecified: Secondary | ICD-10-CM

## 2011-06-14 ENCOUNTER — Encounter: Payer: Medicaid Other | Admitting: Oncology

## 2011-06-14 DIAGNOSIS — K861 Other chronic pancreatitis: Secondary | ICD-10-CM

## 2011-06-16 ENCOUNTER — Encounter (HOSPITAL_BASED_OUTPATIENT_CLINIC_OR_DEPARTMENT_OTHER): Payer: Medicaid Other | Admitting: Oncology

## 2011-06-16 DIAGNOSIS — Z452 Encounter for adjustment and management of vascular access device: Secondary | ICD-10-CM

## 2011-06-16 DIAGNOSIS — C099 Malignant neoplasm of tonsil, unspecified: Secondary | ICD-10-CM

## 2011-06-18 ENCOUNTER — Encounter (HOSPITAL_BASED_OUTPATIENT_CLINIC_OR_DEPARTMENT_OTHER): Payer: Medicaid Other | Admitting: Oncology

## 2011-06-18 DIAGNOSIS — C099 Malignant neoplasm of tonsil, unspecified: Secondary | ICD-10-CM

## 2011-06-18 DIAGNOSIS — Z452 Encounter for adjustment and management of vascular access device: Secondary | ICD-10-CM

## 2011-06-21 ENCOUNTER — Encounter (HOSPITAL_COMMUNITY): Payer: Self-pay | Admitting: Dentistry

## 2011-06-21 ENCOUNTER — Encounter (HOSPITAL_BASED_OUTPATIENT_CLINIC_OR_DEPARTMENT_OTHER): Payer: Medicaid Other | Admitting: Oncology

## 2011-06-21 DIAGNOSIS — C099 Malignant neoplasm of tonsil, unspecified: Secondary | ICD-10-CM

## 2011-06-21 DIAGNOSIS — Z452 Encounter for adjustment and management of vascular access device: Secondary | ICD-10-CM

## 2011-06-21 DIAGNOSIS — K08109 Complete loss of teeth, unspecified cause, unspecified class: Secondary | ICD-10-CM

## 2011-06-22 ENCOUNTER — Other Ambulatory Visit (HOSPITAL_COMMUNITY): Payer: Medicaid Other

## 2011-06-23 ENCOUNTER — Encounter (HOSPITAL_BASED_OUTPATIENT_CLINIC_OR_DEPARTMENT_OTHER): Payer: Medicaid Other | Admitting: Oncology

## 2011-06-23 DIAGNOSIS — Z452 Encounter for adjustment and management of vascular access device: Secondary | ICD-10-CM

## 2011-06-23 DIAGNOSIS — C099 Malignant neoplasm of tonsil, unspecified: Secondary | ICD-10-CM

## 2011-06-29 ENCOUNTER — Other Ambulatory Visit (HOSPITAL_COMMUNITY): Payer: Self-pay | Admitting: Dentistry

## 2011-06-29 DIAGNOSIS — K08109 Complete loss of teeth, unspecified cause, unspecified class: Secondary | ICD-10-CM

## 2011-06-29 DIAGNOSIS — K117 Disturbances of salivary secretion: Secondary | ICD-10-CM

## 2011-07-05 LAB — CBC
HCT: 25 — ABNORMAL LOW
HCT: 31.8 — ABNORMAL LOW
Hemoglobin: 10.5 — ABNORMAL LOW
Hemoglobin: 8.6 — ABNORMAL LOW
MCHC: 32.9
MCHC: 33.3
MCHC: 33.8
MCV: 97.8
MCV: 98.5
MCV: 98.7
Platelets: 196
Platelets: 226
Platelets: 293
RBC: 2.61 — ABNORMAL LOW
RBC: 2.82 — ABNORMAL LOW
RDW: 15.3
RDW: 15.4
RDW: 15.7 — ABNORMAL HIGH
WBC: 3.9 — ABNORMAL LOW

## 2011-07-05 LAB — COMPREHENSIVE METABOLIC PANEL
ALT: 77 — ABNORMAL HIGH
AST: 111 — ABNORMAL HIGH
AST: 57 — ABNORMAL HIGH
Albumin: 2.7 — ABNORMAL LOW
Albumin: 2.8 — ABNORMAL LOW
Alkaline Phosphatase: 445 — ABNORMAL HIGH
BUN: 2 — ABNORMAL LOW
BUN: 4 — ABNORMAL LOW
CO2: 26
CO2: 30
Calcium: 8.5
Calcium: 8.7
Chloride: 101
Chloride: 104
Creatinine, Ser: 0.42
Creatinine, Ser: 0.42
Creatinine, Ser: 0.45
GFR calc Af Amer: >60
GFR calc Af Amer: >60
GFR calc non Af Amer: >60
GFR calc non Af Amer: >60
Glucose, Bld: 87
Sodium: 136
Sodium: 137
Total Bilirubin: 0.4
Total Bilirubin: 0.5
Total Protein: 6
Total Protein: 6
Total Protein: 6.3

## 2011-07-05 LAB — CROSSMATCH

## 2011-07-05 LAB — LIPID PANEL
Cholesterol: 91
HDL: 40
LDL Cholesterol: 42
Total CHOL/HDL Ratio: 2.3
Triglycerides: 44

## 2011-07-05 LAB — TSH: TSH: 2.133

## 2011-07-06 LAB — AMYLASE: Amylase: 244 — ABNORMAL HIGH

## 2011-07-06 LAB — URINALYSIS, ROUTINE W REFLEX MICROSCOPIC
Bilirubin Urine: NEGATIVE
Bilirubin Urine: NEGATIVE
Bilirubin Urine: NEGATIVE
Glucose, UA: NEGATIVE
Glucose, UA: NEGATIVE
Hgb urine dipstick: NEGATIVE
Ketones, ur: NEGATIVE
Ketones, ur: NEGATIVE
Nitrite: NEGATIVE
Protein, ur: NEGATIVE
Protein, ur: NEGATIVE
Protein, ur: NEGATIVE
Urobilinogen, UA: 1
Urobilinogen, UA: 0.2

## 2011-07-06 LAB — CROSSMATCH: Antibody Screen: NEGATIVE

## 2011-07-06 LAB — COMPREHENSIVE METABOLIC PANEL
ALT: 12
ALT: 19
AST: 16
AST: 20
Albumin: 3.5
Albumin: 3.9
Alkaline Phosphatase: 105
BUN: 6
CO2: 26
Calcium: 8.4
Calcium: 8.7
Chloride: 104
Chloride: 101
Creatinine, Ser: 0.45
Creatinine, Ser: 0.59
GFR calc Af Amer: >60
GFR calc Af Amer: >60
GFR calc non Af Amer: >60
Glucose, Bld: 93
Glucose, Bld: 99
Potassium: 4
Potassium: 3.9
Sodium: 136
Sodium: 133 — ABNORMAL LOW
Total Bilirubin: 0.3
Total Bilirubin: 0.5
Total Protein: 6.6

## 2011-07-06 LAB — DIFFERENTIAL
Basophils Absolute: 0
Basophils Absolute: 0
Basophils Relative: 0
Eosinophils Absolute: 0.1
Eosinophils Relative: 3
Eosinophils Relative: 3
Lymphocytes Relative: 31
Lymphs Abs: 1.1
Lymphs Abs: 1.3
Monocytes Absolute: 0.5
Monocytes Absolute: 1.1 — ABNORMAL HIGH
Monocytes Relative: 13 — ABNORMAL HIGH
Neutro Abs: 2.2
Neutrophils Relative %: 46
Neutrophils Relative %: 53

## 2011-07-06 LAB — CBC
HCT: 30.1 — ABNORMAL LOW
HCT: 32.2 — ABNORMAL LOW
HCT: 41.8
Hemoglobin: 10.6 — ABNORMAL LOW
Hemoglobin: 15.7
Hemoglobin: 7.1 — CL
Hemoglobin: 9.8 — ABNORMAL LOW
MCHC: 32.6
MCHC: 32.6
MCHC: 32.9
MCHC: 32.9
MCHC: 33
MCV: 100.1 — ABNORMAL HIGH
MCV: 100.3 — ABNORMAL HIGH
Platelets: 165
Platelets: 172
Platelets: 192
Platelets: 186
RBC: 2.13 — ABNORMAL LOW
RBC: 4.53
RDW: 17.1 — ABNORMAL HIGH
RDW: 17.2 — ABNORMAL HIGH
RDW: 17.8 — ABNORMAL HIGH
WBC: 3.4 — ABNORMAL LOW
WBC: 6.7

## 2011-07-06 LAB — PROTIME-INR
INR: 1.1
Prothrombin Time: 14.4

## 2011-07-06 LAB — HEMOGLOBIN AND HEMATOCRIT, BLOOD
HCT: 42.3
Hemoglobin: 14
Hemoglobin: 14.6

## 2011-07-06 LAB — MAGNESIUM: Magnesium: 1.9

## 2011-07-06 LAB — RETICULOCYTES
RBC.: 3.05 — ABNORMAL LOW
Retic Count, Absolute: 30.5

## 2011-07-06 LAB — BASIC METABOLIC PANEL
BUN: 2 — ABNORMAL LOW
BUN: 9
CO2: 27
Calcium: 8.5
Chloride: 107
Creatinine, Ser: 0.44
GFR calc non Af Amer: >60
Glucose, Bld: 90
Sodium: 137

## 2011-07-06 LAB — CARDIAC PANEL(CRET KIN+CKTOT+MB+TROPI)
Relative Index: INVALID
Total CK: 61

## 2011-07-06 LAB — IRON AND TIBC: TIBC: 232

## 2011-07-06 LAB — FOLATE: Folate: 7.7

## 2011-07-06 LAB — LIPASE, BLOOD: Lipase: 45

## 2011-07-07 LAB — URINALYSIS, ROUTINE W REFLEX MICROSCOPIC
Bilirubin Urine: NEGATIVE
Nitrite: NEGATIVE
Specific Gravity, Urine: 1.03
Urobilinogen, UA: 1
pH: 6.5

## 2011-07-07 LAB — DIFFERENTIAL
Basophils Absolute: 0
Eosinophils Relative: 2
Lymphocytes Relative: 15
Lymphs Abs: 0.9
Neutro Abs: 4.3

## 2011-07-07 LAB — COMPREHENSIVE METABOLIC PANEL
AST: 24
BUN: 4 — ABNORMAL LOW
CO2: 24
Calcium: 9
Creatinine, Ser: 0.53
GFR calc Af Amer: >60
GFR calc non Af Amer: >60
Total Bilirubin: 0.4

## 2011-07-07 LAB — CBC
HCT: 33.4 — ABNORMAL LOW
MCHC: 33.1
MCV: 98.6
Platelets: 331
RBC: 3.39 — ABNORMAL LOW

## 2011-07-07 LAB — LIPASE, BLOOD: Lipase: 140 — ABNORMAL HIGH

## 2011-07-07 LAB — RAPID URINE DRUG SCREEN, HOSP PERFORMED
Barbiturates: NOT DETECTED
Cocaine: NOT DETECTED
Opiates: POSITIVE — AB
Tetrahydrocannabinol: NOT DETECTED

## 2011-07-09 ENCOUNTER — Ambulatory Visit
Admission: RE | Admit: 2011-07-09 | Discharge: 2011-07-09 | Disposition: A | Discharge status: Home / Self Care | Payer: Medicaid Other | Source: Ambulatory Visit | Attending: Radiation Oncology | Admitting: Radiation Oncology

## 2011-07-09 LAB — COMPREHENSIVE METABOLIC PANEL
AST: 19 U/L (ref 0–37)
Albumin: 3.8 g/dL (ref 3.5–5.2)
Alkaline Phosphatase: 85 U/L (ref 39–117)
BUN: 8 mg/dL (ref 6–23)
CO2: 31 mEq/L (ref 19–32)
Chloride: 106 mEq/L (ref 96–112)
Creatinine, Ser: 0.68 mg/dL (ref 0.4–1.5)
GFR calc non Af Amer: >60 mL/min (ref >60)
Potassium: 4.7 mEq/L (ref 3.5–5.1)
Total Bilirubin: 0.4 mg/dL (ref 0.3–1.2)

## 2011-07-09 LAB — CBC
HCT: 38.8 % — ABNORMAL LOW (ref 39.0–52.0)
MCV: 98.7 fL (ref 78.0–100.0)
Platelets: 187 10*3/uL (ref 150–400)
RBC: 3.93 MIL/uL — ABNORMAL LOW (ref 4.22–5.81)
WBC: 6.6 10*3/uL (ref 4.0–10.5)

## 2011-07-09 LAB — AMYLASE: Amylase: 245 U/L — ABNORMAL HIGH (ref 27–131)

## 2011-07-09 LAB — DIFFERENTIAL
Basophils Absolute: 0 10*3/uL (ref 0.0–0.1)
Basophils Relative: 0 % (ref 0–1)
Eosinophils Relative: 2 % (ref 0–5)
Monocytes Absolute: 1 10*3/uL (ref 0.1–1.0)
Neutro Abs: 3.8 10*3/uL (ref 1.7–7.7)

## 2011-07-09 LAB — LIPASE, BLOOD: Lipase: 32 U/L (ref 11–59)

## 2011-07-14 ENCOUNTER — Encounter (HOSPITAL_COMMUNITY): Payer: Self-pay | Admitting: Dentistry

## 2011-07-14 DIAGNOSIS — K08109 Complete loss of teeth, unspecified cause, unspecified class: Secondary | ICD-10-CM

## 2011-07-14 DIAGNOSIS — K117 Disturbances of salivary secretion: Secondary | ICD-10-CM

## 2011-07-18 ENCOUNTER — Encounter: Payer: Self-pay | Admitting: Oncology

## 2011-07-18 DIAGNOSIS — K861 Other chronic pancreatitis: Secondary | ICD-10-CM | POA: Insufficient documentation

## 2011-07-23 ENCOUNTER — Ambulatory Visit: Payer: Medicaid Other

## 2011-07-26 DIAGNOSIS — K08109 Complete loss of teeth, unspecified cause, unspecified class: Secondary | ICD-10-CM

## 2011-08-04 ENCOUNTER — Encounter (HOSPITAL_COMMUNITY): Payer: Self-pay | Admitting: Dentistry

## 2011-08-04 ENCOUNTER — Encounter: Payer: Self-pay | Admitting: *Deleted

## 2011-08-04 DIAGNOSIS — K08109 Complete loss of teeth, unspecified cause, unspecified class: Secondary | ICD-10-CM

## 2011-08-04 DIAGNOSIS — K117 Disturbances of salivary secretion: Secondary | ICD-10-CM

## 2011-08-06 ENCOUNTER — Encounter (HOSPITAL_COMMUNITY): Payer: Self-pay | Admitting: Dentistry

## 2011-08-09 ENCOUNTER — Ambulatory Visit (HOSPITAL_COMMUNITY): Payer: Medicaid - Dental | Admitting: Dentistry

## 2011-08-09 VITALS — BP 109/79 | HR 86 | Temp 97.1°F

## 2011-08-09 DIAGNOSIS — K117 Disturbances of salivary secretion: Secondary | ICD-10-CM

## 2011-08-09 DIAGNOSIS — K08109 Complete loss of teeth, unspecified cause, unspecified class: Secondary | ICD-10-CM

## 2011-08-09 NOTE — Progress Notes (Signed)
  BP:131/72                  P:73              T:  98.2   Russell Williamson presents for evaluation of recently inserted upper and lower complete dentures.   Subjective: Patient denies having problems with dentures at this time. Exam: No sign of denture irritation or erythema. Excessive xerostomia noted. Excessive denture adhesive noted. Patient cautioned about excessive use of the denture adhesive  at this time. Procedure: PIP applied to F/F dentures. Adjustments made as needed. [name]. Occlusion evaluated and adjustments made as needed for CR and protrusive strokes. Pt. accepts results. Pt. to keep F/F out if sore spots develop. Use salt water rinses as needed to aid healing. RTC as scheduled for denture adjustment. Call if problems arise before then. Pt. dismissed in stable condition. Dr. Cindra Eves

## 2011-08-12 ENCOUNTER — Telehealth: Payer: Self-pay | Admitting: Radiation Oncology

## 2011-08-12 NOTE — Telephone Encounter (Signed)
Returned call to [employer]. Got no answer at either number provided. Left message on voicemail of [phone] number.

## 2011-08-16 ENCOUNTER — Telehealth: Payer: Self-pay | Admitting: Radiation Oncology

## 2011-08-16 NOTE — Telephone Encounter (Signed)
10:27 AM This Clinical research associate obtained a message from Darryl Nestle, Media planner, to call Russell Williamson, sister of Russell Williamson. This writer phoned [phone] and [phone] on numerous occasions since 08/12/2011 with no answer. This Clinical research associate was able to reach Russell Williamson today. Russell Williamson reports she is unable to check messages on her phones because she does not have the password for her in box. Russell Williamson reports her brother would like to be seen by Dr. Mitzi Hansen because "he can't stand the phlegm build up anymore." Transferred Russell Williamson to [place], Media planner, to set up follow up appointment with Dr. Mitzi Hansen on Friday.

## 2011-08-19 ENCOUNTER — Encounter: Payer: Self-pay | Admitting: Radiation Oncology

## 2011-08-19 NOTE — Progress Notes (Signed)
Patient scheduled for follow up appointment with Dr. Mitzi Hansen to address neck edema. On 07/09/11 Dr. Mitzi Hansen prescribed Trental 400 mg tid and 800 units of Vitamin E per day. Patient's sister phone requesting a follow up declaring the swelling in her brother's neck was no better.

## 2011-08-20 ENCOUNTER — Encounter: Payer: Self-pay | Admitting: Radiation Oncology

## 2011-08-20 ENCOUNTER — Ambulatory Visit
Admission: RE | Admit: 2011-08-20 | Discharge: 2011-08-20 | Disposition: A | Discharge status: Home / Self Care | Payer: Medicaid Other | Source: Ambulatory Visit | Attending: Radiation Oncology | Admitting: Radiation Oncology

## 2011-08-20 ENCOUNTER — Ambulatory Visit: Payer: Medicaid Other | Admitting: Radiation Oncology

## 2011-08-20 ENCOUNTER — Telehealth: Payer: Self-pay | Admitting: Radiation Oncology

## 2011-08-20 ENCOUNTER — Encounter: Payer: Self-pay | Admitting: Nutrition

## 2011-08-20 DIAGNOSIS — C099 Malignant neoplasm of tonsil, unspecified: Secondary | ICD-10-CM

## 2011-08-20 DIAGNOSIS — Z85818 Personal history of malignant neoplasm of other sites of lip, oral cavity, and pharynx: Secondary | ICD-10-CM | POA: Diagnosis present

## 2011-08-20 NOTE — Progress Notes (Signed)
CC:   Kristine Garbe. Ezzard Standing, M.D. Exie Parody, M.D.  DIAGNOSIS:  Squamous cell carcinoma of the left tonsil, T2 N2a M0.  INTERVAL HISTORY:  Mr. Maher asked to come in today to clinic for re- evaluation.  He complains of some increased persistent swelling in the neck.  He states that sometimes he feels affects his breathing in some degree, especially early in the morning.  The patient also complains of some ongoing xerostomia and irritation within the mouth.  PHYSICAL EXAM:  Vital signs: Weight 120 pounds, blood pressure 96/67, pulse 80, O2 saturation 99%, and respiratory rate 20.  HEENT: Examination of the oral cavity reveals no ongoing mucositis or lesions that I can appreciate within the oral cavity or oropharynx.  The patient states that the lateral aspect of the tongue is somewhat irritated, but I do not see any visible pathology in this area.  Significant bilateral lymphedema is present within the upper neck and in the submandibular region.  I do not appreciate any palpable lymphadenopathy or nodularity. Cardiovascular:  Regular rate and rhythm.  Respiratory is clear to auscultation.  IMPRESSION AND PLAN:  Mr. Littler appears to be continuing to recover from his treatment.  I have recommended that he continue Trental and vitamin E.  I do believe that he has some remaining healing to do and this will take time.  He is still not yet 6 months out from his treatment.  Given his concerns and neck swelling, I indicated to him that I would order a CT scan of the neck and have him return to clinic to review the results of this.  This will be to make sure that his swelling is not related to any persistent or recurrent disease.  If this returns negative it may be worth getting him in to the [facility].  I have not worked with them a lot for people with swelling in the neck area, but they he may have some recommendations or exercises that they can give [place] to try to get the  swelling down if due to benign cause.  This very well may be radiation treatment-related.  We will therefore proceed with this plan.  I spent 10 minutes with Mr. Sermon today, the majority of which was spent counseling him on his diagnosis and coordinating his care.    ______________________________ Radene Gunning, M.D., Ph.D. JSM/MEDQ  D:  08/20/2011  T:  08/20/2011  Job:  [id number]

## 2011-08-20 NOTE — Telephone Encounter (Signed)
Phoned Dorothy, patient's sister/caregiver, informing her of my concerns about her brother's feeding tube. Encouraged her to contact the surgeon who placed the tube and have it evaluated. She verbalized understanding and expressed thanks for the call.

## 2011-08-20 NOTE — Progress Notes (Signed)
Patient presents to the clinic today unaccompanied for follow up appointment with Dr. Mitzi Hansen. No distress noted. Steady gait noted. Pleasant affect noted. Patient reports dry mouth of which is more severe first thing each morning. Patient drinks sips of water regularly to replace moisture in his mouth. Patient reports he smokes 1-2 cigarettes per day but, is trying to stop. Patient reports taking Trental and Vitamin E as prescribed by Dr. Mitzi Hansen. Patient reports that occasionally he is light headed. Patient reports eating a soft diet plus 3 cans of Ensure or Boost per day. Patient reports food and liquid cause the left side of his mouth and tongue to burn. Patient reports tongue and mouth pain presently 5 on a scale of 0-10. Patient reports food get "hung up in his throat and he has to drink liquid to get it down." Patient denies any breathing difficulties.

## 2011-08-20 NOTE — Progress Notes (Signed)
Mr. Mcfann weight has increased slightly to 125 pounds from 122.4 pounds 09/19.  He reports that he continues to try to put 6 cans of Osmolite 1.5 in his feeding tube.  He runs a continuous tube feeding at 100 mL an hour but he breaks it up throughout the day.  He states that he does consume 2-3 Ensure a day by mouth in addition to a soft diet.  He complains of dry mouth and some burning sensation in his tongue.  He continues to have some neck swelling.  He reports that he feels that his tube could have something wrong with it and that when he flushes it with water the water feels like it is going up into his esophagus instead of down into his stomach.  I have informed his RN to evaluate.  NUTRITION DIAGNOSIS:  Of inadequate oral intake continues.  INTERVENTION:  The patient is to continue 6 cans of Osmolite 1.5 daily via his feeding tube continuously overnight and during the day as desired.  He will continue to consume a soft diet with Ensure Plus or Boost Plus b.i.d. to t.i.d. to promote weight gain.  MONITORING, EVALUATION AND GOAL:  The patient has been able to gain weight although only 3 pounds since our last visit.  He will continue to work to increase his oral intake to eventually wean tube feeding once he can sustain his weight on oral intake alone.  NEXT VISIT:  I will schedule a followup with the patient in January.    ______________________________ Zenovia Jarred, RD, LDN Clinical Nutrition Specialist BN/MEDQ  D:  08/20/2011  T:  08/20/2011  Job:  [id number]

## 2011-08-24 ENCOUNTER — Other Ambulatory Visit: Payer: Self-pay | Admitting: *Deleted

## 2011-08-24 ENCOUNTER — Telehealth: Payer: Self-pay | Admitting: *Deleted

## 2011-08-24 ENCOUNTER — Other Ambulatory Visit: Payer: Self-pay | Admitting: Radiation Oncology

## 2011-08-24 DIAGNOSIS — C099 Malignant neoplasm of tonsil, unspecified: Secondary | ICD-10-CM

## 2011-08-24 MED ORDER — MAGIC MOUTHWASH W/LIDOCAINE: 5.0000 mL | Freq: Four times a day (QID) | ORAL | Status: DC | PRN

## 2011-08-24 NOTE — Telephone Encounter (Signed)
Called sister back w/ reply from Dr. Gaylyn Rong.  She verbalized understanding of using MMW and calling Dr. Ezzard Standing if pt's pain does not get any better.  She states she thinks pt needs refill on MMW sent to [facility].  I will request refill.   She will call [facility] IR to confirm appt for tomorrow to eval pt's PEG tube.

## 2011-08-24 NOTE — Telephone Encounter (Signed)
Please have him try magic mouth wash.  If no improvement, he needs to see his ENT doc.

## 2011-08-24 NOTE — Telephone Encounter (Signed)
Call from pt's sister reporting pt c/o new pain/soreness and swelling inside left mouth.  States saw Dr. Mitzi Hansen last week, but pt says it is worse since that visit.   Sister also report pt's PEG tube tip is broken and needs replaced.   This Nurse will arrange for pt's PEG tube to be evaluated by IR and message sent to Dr. Gaylyn Rong regarding mouth pain/swelling.

## 2011-08-25 ENCOUNTER — Telehealth: Payer: Self-pay | Admitting: *Deleted

## 2011-08-25 ENCOUNTER — Ambulatory Visit (HOSPITAL_COMMUNITY)
Admission: RE | Admit: 2011-08-25 | Discharge: 2011-08-25 | Disposition: A | Discharge status: Home / Self Care | Payer: Medicaid Other | Source: Ambulatory Visit | Attending: Oncology | Admitting: Oncology

## 2011-08-25 DIAGNOSIS — C099 Malignant neoplasm of tonsil, unspecified: Secondary | ICD-10-CM

## 2011-09-02 ENCOUNTER — Encounter (HOSPITAL_COMMUNITY): Payer: Self-pay | Admitting: Dentistry

## 2011-09-03 ENCOUNTER — Ambulatory Visit: Payer: Medicaid Other | Admitting: Radiation Oncology

## 2011-09-13 ENCOUNTER — Ambulatory Visit
Admission: RE | Admit: 2011-09-13 | Discharge: 2011-09-13 | Disposition: A | Discharge status: Home / Self Care | Payer: Medicaid Other | Source: Ambulatory Visit | Attending: Radiation Oncology | Admitting: Radiation Oncology

## 2011-09-13 DIAGNOSIS — C099 Malignant neoplasm of tonsil, unspecified: Secondary | ICD-10-CM

## 2011-09-13 LAB — CREATININE, SERUM: Creatinine, Ser: 0.65 mg/dL (ref 0.50–1.35)

## 2011-09-13 LAB — BUN: BUN: 12 mg/dL (ref 6–23)

## 2011-09-14 ENCOUNTER — Ambulatory Visit (HOSPITAL_COMMUNITY)
Admission: RE | Admit: 2011-09-14 | Discharge: 2011-09-14 | Disposition: A | Discharge status: Home / Self Care | Payer: Medicaid Other | Source: Ambulatory Visit | Attending: Radiation Oncology | Admitting: Radiation Oncology

## 2011-09-14 DIAGNOSIS — Z9221 Personal history of antineoplastic chemotherapy: Secondary | ICD-10-CM | POA: Insufficient documentation

## 2011-09-14 DIAGNOSIS — I82891 Chronic embolism and thrombosis of other specified veins: Secondary | ICD-10-CM | POA: Insufficient documentation

## 2011-09-14 DIAGNOSIS — R220 Localized swelling, mass and lump, head: Secondary | ICD-10-CM | POA: Insufficient documentation

## 2011-09-14 DIAGNOSIS — Z923 Personal history of irradiation: Secondary | ICD-10-CM | POA: Insufficient documentation

## 2011-09-14 DIAGNOSIS — I82291 Chronic embolism and thrombosis of other thoracic veins: Secondary | ICD-10-CM | POA: Insufficient documentation

## 2011-09-14 DIAGNOSIS — R1310 Dysphagia, unspecified: Secondary | ICD-10-CM | POA: Insufficient documentation

## 2011-09-14 DIAGNOSIS — R221 Localized swelling, mass and lump, neck: Secondary | ICD-10-CM | POA: Insufficient documentation

## 2011-09-14 DIAGNOSIS — J438 Other emphysema: Secondary | ICD-10-CM | POA: Insufficient documentation

## 2011-09-14 DIAGNOSIS — C099 Malignant neoplasm of tonsil, unspecified: Secondary | ICD-10-CM | POA: Insufficient documentation

## 2011-09-14 MED ORDER — IOHEXOL 300 MG/ML  SOLN
100.0000 mL | Freq: Once | INTRAMUSCULAR | Status: AC | PRN
  Administered 2011-09-14: 100 mL via INTRAVENOUS

## 2011-09-17 ENCOUNTER — Encounter: Payer: Self-pay | Admitting: Radiation Oncology

## 2011-09-17 ENCOUNTER — Ambulatory Visit
Admission: RE | Admit: 2011-09-17 | Discharge: 2011-09-17 | Disposition: A | Discharge status: Home / Self Care | Payer: Medicaid Other | Source: Ambulatory Visit | Attending: Radiation Oncology | Admitting: Radiation Oncology

## 2011-09-17 DIAGNOSIS — C099 Malignant neoplasm of tonsil, unspecified: Secondary | ICD-10-CM

## 2011-09-17 NOTE — Progress Notes (Signed)
CC:   Kristine Garbe. Ezzard Standing, M.D. Exie Parody, M.D.  DIAGNOSIS:  Squamous cell carcinoma of the left tonsil, T2 N2a M0.  INTERVAL HISTORY:  Mr. Russell Williamson returns to the clinic today to discuss the results of his recent CT scan of the neck.  He has had some ongoing swelling in the neck, and we did proceed with a repeat CT scan, given some concern on his part and the worsening of swelling in his neck. Certainly this could sometimes occur in the setting of postradiation change, but he was certainly experiencing some swelling much greater than is typical.  He did undergo his CT scan of the neck, and this did not show any cervical lymphadenopathy or clear evidence of local progressive disease.  Several small lymph nodes were visualized, and these can be continued to be followed.  Of significance, it was noted that chronic thoracic central venous occlusion, including bilateral innominate vein and SVC occlusion, was present with some collateral formation.  The SVC, for instance, appears chronically thrombosed.  I discussed with the patient that this issue certainly would contribute to increased swelling, especially in conjunction with his treatment.  The patient, of note, is having some sensitivity in the mouth as well which may increased a little bit in recent weeks.  PHYSICAL EXAM:  Vital Signs:  Weight 121 pounds.  Blood pressure 104/65, pulse 75, respiratory rate 20.  HEENT/Neck:  Examination of the oral cavity does not reveal any ulcerations, mucositis, thrush, or evidence of residual disease.  The patient has some ongoing swelling in the neck, greater on the right than the left, and this does extend into the buccal region.  IMPRESSION AND PLAN:  Mr. [name] continues to have some radiation change both clinically and on imaging.  His major issue continues to be some swelling in the neck, and I believe that his venous occlusion seen on his recent CT scan, lower down in the central  thoracic cavity would likely playing a significant role in this.  I discussed this finding with him, and I have had a chance to discuss this issue with Dr. Gaylyn Rong, who was aware of this, as this was noted previously, although this was not specifically mentioned on his most immediately prior CT of the neck. Certainly on this scan it looks like a chronic issue, and therefore, I do not believe that anything acutely would be recommended at this time. I discussed this possibility with Mr. [name], but I did discuss with him that I wanted to touch base with Dr. Gaylyn Rong, for instance, to get his opinion on whether anything else could be of benefit in the immediate future, but this does not appear to be the case.  I therefore will schedule Mr. Roskelley for a followup in 3 months for ongoing followup.    ______________________________ Radene Gunning, M.D., Ph.D. JSM/MEDQ  D:  09/17/2011  T:  09/17/2011  Job:  [id number]

## 2011-09-17 NOTE — Progress Notes (Signed)
Patient presents to the clinic today unaccompanied for follow up appointment with Dr. Mitzi Hansen to obtain results of 09/14/11 CT scan. No distress noted. Steady gait noted. Pleasant affect noted. Patient reports dry mouth of which is more severe first thing each morning. Patient drinks sips of water regularly to replace moisture in his mouth. Patient reports he smokes 1-2 cigarettes per day but, is trying to stop. Patient reports taking Trental and Vitamin E as prescribed by Dr. Mitzi Hansen. Patient reports that occasionally he is light headed. Patient reports eating a soft diet plus 3 cans of Ensure or Boost per day. Patient reports food and liquid cause the left side of his mouth and tongue to burn. Patient reports tongue and mouth pain presently 5 on a scale of 0-10. Patient reports food get "hung up in his throat and he has to drink liquid to get it down." Patient denies any breathing difficulties. Patient reports that the teeth of the right lower jaw constantly ache. Patient states,"When I get up at 5 each morning my neck is swollen but, not as bad as it is later in the day."

## 2011-09-21 ENCOUNTER — Ambulatory Visit (HOSPITAL_COMMUNITY): Payer: Medicaid - Dental | Admitting: Dentistry

## 2011-09-21 VITALS — BP 102/69 | HR 76 | Temp 98.6°F

## 2011-09-21 DIAGNOSIS — K1370 Unspecified lesions of oral mucosa: Secondary | ICD-10-CM

## 2011-09-21 DIAGNOSIS — K117 Disturbances of salivary secretion: Secondary | ICD-10-CM

## 2011-09-21 DIAGNOSIS — K062 Gingival and edentulous alveolar ridge lesions associated with trauma: Secondary | ICD-10-CM

## 2011-09-21 DIAGNOSIS — Z463 Encounter for fitting and adjustment of dental prosthetic device: Secondary | ICD-10-CM

## 2011-09-21 NOTE — Progress Notes (Signed)
Tuesday, September 21, 2011  BP: 102/69           P:  76          T: 98.6  Russell Williamson presents for evaluation of recently inserted upper and lower complete dentures.  Subjective: Patient indicates he is having problems wearing dentures. Patient has sore spots on bottom. Exam: No sign of denture irritation. Generalized erythema noted. Severe xerostomia noted. Procedure: Pressure indicating paste applied to dentures. Adjustments made as needed. [name]. Occlusion evaluated and adjustments made as needed for centric relation and protrusive strokes. Patient with tendency to protrude lower jaw past maximum intercuspation position. Patient instructed on how to bite to allow for better occlusion. Pt. accepts results. Pt. to keep dentures out if sore spots develop. Use salt water rinses as needed to aid healing. RTC as scheduled for denture adjustment. Call if problems arise before then. Pt. dismissed in stable condition. Dr. Kristin Bruins

## 2011-10-01 ENCOUNTER — Telehealth: Payer: Self-pay | Admitting: Oncology

## 2011-10-01 ENCOUNTER — Other Ambulatory Visit: Payer: Self-pay | Admitting: Oncology

## 2011-10-01 DIAGNOSIS — C109 Malignant neoplasm of oropharynx, unspecified: Secondary | ICD-10-CM

## 2011-10-01 NOTE — Telephone Encounter (Signed)
S/w pt today re appts for lb/ct 1/22 and [facility] 1/23.

## 2011-10-07 ENCOUNTER — Telehealth: Payer: Self-pay | Admitting: *Deleted

## 2011-10-07 NOTE — Telephone Encounter (Signed)
Call from pt's sister, Nicole Cella, to confirm pt's lab/Ct and MD appts this month.

## 2011-10-15 ENCOUNTER — Emergency Department (HOSPITAL_COMMUNITY): Payer: Medicaid Other

## 2011-10-15 ENCOUNTER — Encounter (HOSPITAL_COMMUNITY): Payer: Self-pay

## 2011-10-15 ENCOUNTER — Inpatient Hospital Stay (HOSPITAL_COMMUNITY)
Admission: EM | Admit: 2011-10-15 | Discharge: 2011-10-20 | DRG: 439 | Disposition: A | Discharge status: Home / Self Care | Payer: Medicaid Other | Attending: Internal Medicine | Admitting: Internal Medicine

## 2011-10-15 DIAGNOSIS — G8929 Other chronic pain: Secondary | ICD-10-CM | POA: Diagnosis present

## 2011-10-15 DIAGNOSIS — R1310 Dysphagia, unspecified: Secondary | ICD-10-CM | POA: Diagnosis present

## 2011-10-15 DIAGNOSIS — C099 Malignant neoplasm of tonsil, unspecified: Secondary | ICD-10-CM | POA: Diagnosis present

## 2011-10-15 DIAGNOSIS — D61818 Other pancytopenia: Secondary | ICD-10-CM | POA: Diagnosis present

## 2011-10-15 DIAGNOSIS — I10 Essential (primary) hypertension: Secondary | ICD-10-CM | POA: Diagnosis present

## 2011-10-15 DIAGNOSIS — E46 Unspecified protein-calorie malnutrition: Secondary | ICD-10-CM | POA: Diagnosis present

## 2011-10-15 DIAGNOSIS — Z931 Gastrostomy status: Secondary | ICD-10-CM

## 2011-10-15 DIAGNOSIS — K859 Acute pancreatitis without necrosis or infection, unspecified: Principal | ICD-10-CM | POA: Diagnosis present

## 2011-10-15 DIAGNOSIS — Z9221 Personal history of antineoplastic chemotherapy: Secondary | ICD-10-CM

## 2011-10-15 DIAGNOSIS — Z923 Personal history of irradiation: Secondary | ICD-10-CM

## 2011-10-15 DIAGNOSIS — K861 Other chronic pancreatitis: Secondary | ICD-10-CM | POA: Diagnosis present

## 2011-10-15 DIAGNOSIS — R1013 Epigastric pain: Secondary | ICD-10-CM | POA: Diagnosis present

## 2011-10-15 DIAGNOSIS — F17200 Nicotine dependence, unspecified, uncomplicated: Secondary | ICD-10-CM | POA: Diagnosis present

## 2011-10-15 LAB — CBC
HCT: 40.2 % (ref 39.0–52.0)
Hemoglobin: 13.3 g/dL (ref 13.0–17.0)
MCHC: 33.1 g/dL (ref 30.0–36.0)
MCV: 96.6 fL (ref 78.0–100.0)
RDW: 14.9 % (ref 11.5–15.5)

## 2011-10-15 LAB — COMPREHENSIVE METABOLIC PANEL
ALT: 22 U/L (ref 0–53)
AST: 29 U/L (ref 0–37)
Albumin: 4.5 g/dL (ref 3.5–5.2)
Alkaline Phosphatase: 106 U/L (ref 39–117)
BUN: 11 mg/dL (ref 6–23)
CO2: 24 mEq/L (ref 19–32)
Calcium: 10.1 mg/dL (ref 8.4–10.5)
Chloride: 98 mEq/L (ref 96–112)
Creatinine, Ser: 0.53 mg/dL (ref 0.50–1.35)
GFR calc Af Amer: >90 mL/min (ref >90)
GFR calc non Af Amer: >90 mL/min (ref >90)
Glucose, Bld: 102 mg/dL — ABNORMAL HIGH (ref 70–99)
Potassium: 4.8 mEq/L (ref 3.5–5.1)
Sodium: 136 mEq/L (ref 135–145)
Total Bilirubin: 0.3 mg/dL (ref 0.3–1.2)
Total Protein: 9.4 g/dL — ABNORMAL HIGH (ref 6.0–8.3)

## 2011-10-15 LAB — DIFFERENTIAL
Basophils Absolute: 0 10*3/uL (ref 0.0–0.1)
Basophils Relative: 0 % (ref 0–1)
Eosinophils Relative: 0 % (ref 0–5)
Monocytes Absolute: 0.8 10*3/uL (ref 0.1–1.0)
Monocytes Relative: 12 % (ref 3–12)

## 2011-10-15 LAB — LIPASE, BLOOD: Lipase: 77 U/L — ABNORMAL HIGH (ref 11–59)

## 2011-10-15 MED ORDER — ONDANSETRON HCL 4 MG/2ML IJ SOLN
4.0000 mg | Freq: Once | INTRAMUSCULAR | Status: AC
  Administered 2011-10-15: 4 mg via INTRAVENOUS
  Filled 2011-10-15: qty 2

## 2011-10-15 MED ORDER — IOHEXOL 300 MG/ML  SOLN
100.0000 mL | Freq: Once | INTRAMUSCULAR | Status: AC | PRN
  Administered 2011-10-15: 100 mL via INTRAVENOUS

## 2011-10-15 MED ORDER — SODIUM CHLORIDE 0.9 % IV SOLN
INTRAVENOUS | Status: DC
  Administered 2011-10-15: 21:00:00 via INTRAVENOUS

## 2011-10-15 MED ORDER — HYDROMORPHONE HCL PF 1 MG/ML IJ SOLN
1.0000 mg | Freq: Once | INTRAMUSCULAR | Status: AC
  Administered 2011-10-15: 1 mg via INTRAVENOUS
  Filled 2011-10-15: qty 1

## 2011-10-15 NOTE — ED Provider Notes (Signed)
History     CSN: [id number]  Arrival date & time 10/15/11  1911   First MD Initiated Contact with Patient 10/15/11 2008      Chief Complaint  Patient presents with  . Abdominal Pain    states it feels like pancreatitis.  Last etoh 13 yrs ago.     HPI: Patient is a 56 y.o. male presenting with abdominal pain. The history is provided by the patient.  Abdominal Pain The primary symptoms of the illness include abdominal pain. The primary symptoms of the illness do not include fever, nausea, vomiting or diarrhea. The current episode started 6 to 12 hours ago. The onset of the illness was sudden. The problem has been gradually worsening.  Risk factors for an acute abdominal problem include a history of abdominal surgery. Symptoms associated with the illness do not include chills. Significant associated medical issues include GERD and substance abuse.  Patient reports sudden onset of upper abdominal pain upon awakening today. Pain has persisted, has been constant, radiates to his upper back and has not responded to his usual pain control medications.. States the pain is similar to pain he's had in the past with pancreatitis but states he had surgery a couple years ago to have cyst removed from his pancreas and has not had any problems since that time. Describes the pain as a hard, sharp burning pain. Pain worse w/ movement. Denies fever, nausea, vomiting, diarrhea, chest pain or shortness of breath. Patient has a significant history for GERD as well as a recent diagnosis of throat cancer.  Past Medical History  Diagnosis Date  . Tonsillar cancer     tonsillar ca dx 12/27/10  . Hypertension   . Chronic pancreatitis     No past surgical history on file.  No family history on file.  History  Substance Use Topics  . Smoking status: Current Everyday Smoker -- 0.2 packs/day for 30 years    Types: Cigarettes  . Smokeless tobacco: Never Used  . Alcohol Use: No     quit 12 -13 yrs ago       Review of Systems  Constitutional: Negative.  Negative for fever and chills.  HENT: Negative.   Eyes: Negative.   Respiratory: Negative.   Cardiovascular: Negative.   Gastrointestinal: Positive for abdominal pain. Negative for nausea, vomiting and diarrhea.  Genitourinary: Negative.   Musculoskeletal: Negative.   Skin: Negative.   Neurological: Negative.   Hematological: Negative.   Psychiatric/Behavioral: Negative.     Allergies  Review of patient's allergies indicates no known allergies.  Home Medications   Current Outpatient Rx  Name Route Sig Dispense Refill  . MAGIC MOUTHWASH W/LIDOCAINE Oral Take 5 mLs by mouth 4 (four) times daily as needed (Swish and Spit). 500 mL 0  . VITAMIN D 1000 UNITS PO CAPS Oral Take 1,000 Units by mouth daily.    . FENTANYL 50 MCG/HR TD PT72 Transdermal Place 1 patch onto the skin every 3 (three) days.    . OMEGA-3 FATTY ACIDS 1000 MG PO CAPS Oral Take 1 g by mouth daily.    Marland Kitchen PANCRELIPASE (LIP-PROT-AMYL) 12000 UNITS PO CPEP Oral Take 1 capsule by mouth 4 (four) times daily.    Marland Kitchen LORAZEPAM 0.5 MG PO TABS Oral Take 0.5 mg by mouth every 6 (six) hours as needed.      Marland Kitchen METOPROLOL TARTRATE 50 MG PO TABS Oral Take 50 mg by mouth 2 (two) times daily.    Marland Kitchen OMEPRAZOLE 40 MG PO CPDR  Oral Take 40 mg by mouth daily.     . OXYCODONE HCL 15 MG PO TABS Oral Take 15 mg by mouth every 4 (four) hours as needed. pain    . PROMETHAZINE HCL 25 MG PO TABS Oral Take 25 mg by mouth every 6 (six) hours as needed.      . SENNOSIDES 8.6 MG PO TABS Oral Take 2 tablets by mouth daily.      Marland Kitchen VITAMIN E 400 UNITS PO CAPS Oral Take 400 Units by mouth 2 (two) times daily.      Marland Kitchen ZOLPIDEM TARTRATE 10 MG PO TABS Oral Take 10 mg by mouth at bedtime as needed. sleep      BP 146/100  Pulse 88  Temp 98.4 F (36.9 C)  Resp 18  Ht 5\' 6"  (1.676 m)  Wt 120 lb (54.432 kg)  BMI 19.37 kg/m2  SpO2 99%  Physical Exam  Constitutional: He appears well-developed and  well-nourished.  HENT:  Head: Normocephalic and atraumatic.  Eyes: Conjunctivae are normal.  Neck: Neck supple.  Cardiovascular: Normal rate and regular rhythm.   Pulmonary/Chest: Effort normal and breath sounds normal.  Abdominal: Soft. Bowel sounds are normal. There is tenderness in the right upper quadrant, epigastric area and left upper quadrant.    Musculoskeletal: Normal range of motion.  Neurological: He is alert.  Skin: Skin is warm and dry.  Psychiatric: He has a normal mood and affect.    ED Course  Procedures Patient reports minimal relief with pain w/ IV Dilaudid x2. CT findings c/w acute pancreatitis. Will consult hospitalist for admission. I have discussed pt w/ Dr Golda Acre who is in agreement w/ plan.   Labs Reviewed  CBC  DIFFERENTIAL  COMPREHENSIVE METABOLIC PANEL  LIPASE, BLOOD   No results found.   No diagnosis found.    MDM  HPI/PE and clinical findings c/w acute pancreatitis Will consult w/ Hospitalist for admission         Leanne Chang, NP 10/15/11 2344

## 2011-10-16 ENCOUNTER — Encounter (HOSPITAL_COMMUNITY): Payer: Self-pay | Admitting: Internal Medicine

## 2011-10-16 DIAGNOSIS — I10 Essential (primary) hypertension: Secondary | ICD-10-CM | POA: Diagnosis present

## 2011-10-16 DIAGNOSIS — C099 Malignant neoplasm of tonsil, unspecified: Secondary | ICD-10-CM | POA: Diagnosis present

## 2011-10-16 DIAGNOSIS — K859 Acute pancreatitis without necrosis or infection, unspecified: Principal | ICD-10-CM

## 2011-10-16 LAB — CBC
HCT: 35.9 % — ABNORMAL LOW (ref 39.0–52.0)
MCH: 31.4 pg (ref 26.0–34.0)
MCV: 96.2 fL (ref 78.0–100.0)
Platelets: 156 10*3/uL (ref 150–400)
RDW: 15.1 % (ref 11.5–15.5)
WBC: 5.3 10*3/uL (ref 4.0–10.5)

## 2011-10-16 LAB — BASIC METABOLIC PANEL
BUN: 9 mg/dL (ref 6–23)
CO2: 27 mEq/L (ref 19–32)
Calcium: 9.1 mg/dL (ref 8.4–10.5)
Chloride: 102 mEq/L (ref 96–112)
Creatinine, Ser: 0.57 mg/dL (ref 0.50–1.35)
Glucose, Bld: 90 mg/dL (ref 70–99)

## 2011-10-16 MED ORDER — OMEGA-3 FATTY ACIDS 1000 MG PO CAPS: 1.0000 g | ORAL_CAPSULE | Freq: Every day | ORAL | Status: DC

## 2011-10-16 MED ORDER — VITAMIN D 1000 UNITS PO TABS
1000.0000 [IU] | ORAL_TABLET | Freq: Every day | ORAL | Status: DC
  Administered 2011-10-16 – 2011-10-19 (×4): 1000 [IU] via ORAL
  Filled 2011-10-16 (×5): qty 1

## 2011-10-16 MED ORDER — ONDANSETRON HCL 4 MG PO TABS
4.0000 mg | ORAL_TABLET | Freq: Four times a day (QID) | ORAL | Status: DC | PRN
  Administered 2011-10-17 – 2011-10-18 (×2): 4 mg via ORAL
  Filled 2011-10-16 (×3): qty 1

## 2011-10-16 MED ORDER — PANCRELIPASE (LIP-PROT-AMYL) 12000-38000 UNITS PO CPEP
1.0000 | ORAL_CAPSULE | Freq: Three times a day (TID) | ORAL | Status: DC
  Administered 2011-10-16 – 2011-10-20 (×17): 1 via ORAL
  Filled 2011-10-16 (×21): qty 1

## 2011-10-16 MED ORDER — FENTANYL 50 MCG/HR TD PT72
50.0000 ug | MEDICATED_PATCH | TRANSDERMAL | Status: DC
  Administered 2011-10-16 – 2011-10-19 (×2): 50 ug via TRANSDERMAL
  Filled 2011-10-16 (×2): qty 1

## 2011-10-16 MED ORDER — AMYLASE-LIPASE-PROTEASE 33.2-10-37.5 MU PO CPEP: 1.0000 | ORAL_CAPSULE | Freq: Four times a day (QID) | ORAL | Status: DC

## 2011-10-16 MED ORDER — MAGIC MOUTHWASH W/LIDOCAINE
5.0000 mL | Freq: Four times a day (QID) | ORAL | Status: DC | PRN
Start: 2011-10-16 — End: 2011-10-20
  Filled 2011-10-16: qty 5

## 2011-10-16 MED ORDER — POTASSIUM CHLORIDE CRYS ER 20 MEQ PO TBCR
30.0000 meq | EXTENDED_RELEASE_TABLET | Freq: Once | ORAL | Status: AC
  Administered 2011-10-16: 30 meq via ORAL
  Filled 2011-10-16: qty 1

## 2011-10-16 MED ORDER — CHLORHEXIDINE GLUCONATE 0.12 % MT SOLN
15.0000 mL | Freq: Two times a day (BID) | OROMUCOSAL | Status: DC
  Administered 2011-10-16 – 2011-10-20 (×8): 15 mL via OROMUCOSAL
  Filled 2011-10-16 (×11): qty 15

## 2011-10-16 MED ORDER — VITAMIN E 180 MG (400 UNIT) PO CAPS
400.0000 [IU] | ORAL_CAPSULE | Freq: Two times a day (BID) | ORAL | Status: DC
  Administered 2011-10-16 – 2011-10-19 (×7): 400 [IU] via ORAL
  Filled 2011-10-16 (×12): qty 1

## 2011-10-16 MED ORDER — HYDROMORPHONE HCL PF 1 MG/ML IJ SOLN
1.0000 mg | INTRAMUSCULAR | Status: DC | PRN
  Administered 2011-10-16: 2 mg via INTRAVENOUS
  Administered 2011-10-16 (×4): 1 mg via INTRAVENOUS
  Administered 2011-10-17: 2 mg via INTRAVENOUS
  Administered 2011-10-17 (×2): 1 mg via INTRAVENOUS
  Filled 2011-10-16: qty 1
  Filled 2011-10-16 (×2): qty 2
  Filled 2011-10-16 (×4): qty 1
  Filled 2011-10-16: qty 2

## 2011-10-16 MED ORDER — OMEGA-3-ACID ETHYL ESTERS 1 G PO CAPS
1.0000 g | ORAL_CAPSULE | Freq: Every day | ORAL | Status: DC
  Administered 2011-10-16 – 2011-10-19 (×4): 1 g via ORAL
  Filled 2011-10-16 (×5): qty 1

## 2011-10-16 MED ORDER — PANCRELIPASE (LIP-PROT-AMYL) 12000-38000 UNITS PO CPEP: 1.0000 | ORAL_CAPSULE | Freq: Four times a day (QID) | ORAL | Status: DC

## 2011-10-16 MED ORDER — BIOTENE DRY MOUTH MT LIQD: 15.0000 mL | Freq: Two times a day (BID) | OROMUCOSAL | Status: DC

## 2011-10-16 MED ORDER — BIOTENE DRY MOUTH MT LIQD
15.0000 mL | Freq: Two times a day (BID) | OROMUCOSAL | Status: DC
  Administered 2011-10-16 – 2011-10-18 (×6): 15 mL via OROMUCOSAL

## 2011-10-16 MED ORDER — ONDANSETRON HCL 4 MG/2ML IJ SOLN
4.0000 mg | Freq: Four times a day (QID) | INTRAMUSCULAR | Status: DC | PRN
  Administered 2011-10-20: 4 mg via INTRAVENOUS
  Filled 2011-10-16 (×2): qty 2

## 2011-10-16 MED ORDER — METOPROLOL TARTRATE 50 MG PO TABS
50.0000 mg | ORAL_TABLET | Freq: Two times a day (BID) | ORAL | Status: DC
  Administered 2011-10-16 – 2011-10-19 (×7): 50 mg via ORAL
  Filled 2011-10-16 (×12): qty 1

## 2011-10-16 MED ORDER — LORAZEPAM 0.5 MG PO TABS
0.5000 mg | ORAL_TABLET | Freq: Four times a day (QID) | ORAL | Status: DC | PRN
  Administered 2011-10-17 – 2011-10-19 (×4): 0.5 mg via ORAL
  Filled 2011-10-16 (×4): qty 1

## 2011-10-16 MED ORDER — DOCUSATE SODIUM 100 MG PO CAPS
100.0000 mg | ORAL_CAPSULE | Freq: Two times a day (BID) | ORAL | Status: DC
  Administered 2011-10-16 – 2011-10-19 (×3): 100 mg via ORAL
  Filled 2011-10-16 (×12): qty 1

## 2011-10-16 MED ORDER — DEXTROSE-NACL 5-0.9 % IV SOLN
INTRAVENOUS | Status: DC
  Administered 2011-10-16 – 2011-10-18 (×4): via INTRAVENOUS

## 2011-10-16 MED ORDER — SENNA 8.6 MG PO TABS
2.0000 | ORAL_TABLET | Freq: Every day | ORAL | Status: DC
  Administered 2011-10-16 – 2011-10-19 (×4): 17.2 mg via ORAL
  Filled 2011-10-16 (×2): qty 2
  Filled 2011-10-16: qty 1
  Filled 2011-10-16 (×2): qty 2

## 2011-10-16 MED ORDER — PANTOPRAZOLE SODIUM 40 MG PO TBEC
80.0000 mg | DELAYED_RELEASE_TABLET | Freq: Every day | ORAL | Status: DC
  Administered 2011-10-16 – 2011-10-19 (×4): 80 mg via ORAL
  Filled 2011-10-16 (×5): qty 2

## 2011-10-16 MED ORDER — ZOLPIDEM TARTRATE 10 MG PO TABS
10.0000 mg | ORAL_TABLET | Freq: Every evening | ORAL | Status: DC | PRN
  Administered 2011-10-16 – 2011-10-19 (×5): 10 mg via ORAL
  Filled 2011-10-16 (×5): qty 1

## 2011-10-16 NOTE — ED Provider Notes (Signed)
Medical screening examination/treatment/procedure(s) were performed by non-physician practitioner and as supervising physician I was immediately available for consultation/collaboration.   Nat Christen, MD 10/16/11 [phone]

## 2011-10-16 NOTE — H&P (Signed)
PCP: None   Chief Complaint: Abdominal pain.   HPI: Russell Williamson is an 56 y.o. 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 His pain has been chronic and he denied any significant changes. He will be running out of Percocet which he take 3 times a day shortly. He has been able to take some by mouth, but not significantly. He has been under pain management, and he is aversive uptake in any prescription pain medication because of his contract with pain management. He has had pancreatic cyst surgery before but he cannot elaborate. Evaluation in the emergency room included a lipase of 77, normal white count and hemoglobin, and abdominal pelvic CT showed acute on chronic pancreatitis, with fluid tracking inferiorly from the tail of the pancreas, but no evidence of pancreatic pseudocyst and no abscess. Hospitalist was asked to admit him for acute on chronic pancreatitis. Rewiew of Systems:  The patient denies anorexia, fever, weight loss,, vision loss, decreased hearing, hoarseness, chest pain, syncope, dyspnea on exertion, peripheral edema, balance deficits, hemoptysis, melena, hematochezia, severe indigestion/heartburn, hematuria, incontinence, genital sores, muscle weakness, suspicious skin lesions, transient blindness, difficulty walking, depression, unusual weight change, abnormal bleeding, enlarged lymph nodes, angioedema, and breast masses.    Past Medical History  Diagnosis Date  . Tonsillar cancer     tonsillar ca dx 12/27/10  . Hypertension   . Chronic pancreatitis     No past surgical history on file.  Medications:  HOME MEDS: Prior to Admission medications   Medication Sig Start Date End Date Taking? Authorizing Provider  Alum & Mag Hydroxide-Simeth (MAGIC MOUTHWASH W/LIDOCAINE) SOLN Take 5 mLs by mouth 4 (four) times  daily as needed (Swish and Spit). 08/24/11  Yes Jethro Bolus, MD  Cholecalciferol (VITAMIN D) 1000 UNITS capsule Take 1,000 Units by mouth daily.   Yes Historical Provider, MD  fentaNYL (DURAGESIC - DOSED MCG/HR) 50 MCG/HR Place 1 patch onto the skin every 3 (three) days.   Yes Historical Provider, MD  fish oil-omega-3 fatty acids 1000 MG capsule Take 1 g by mouth daily.   Yes Historical Provider, MD  lipase/protease/amylase (CREON) 12000 UNITS CPEP Take 1 capsule by mouth 4 (four) times daily.   Yes Historical Provider, MD  LORazepam (ATIVAN) 0.5 MG tablet Take 0.5 mg by mouth every 6 (six) hours as needed.     Yes Historical Provider, MD  metoprolol (LOPRESSOR) 50 MG tablet Take 50 mg by mouth 2 (two) times daily.   Yes Historical Provider, MD  omeprazole (PRILOSEC) 40 MG capsule Take 40 mg by mouth daily.    Yes Historical Provider, MD  oxyCODONE (ROXICODONE) 15 MG immediate release tablet Take 15 mg by mouth every 4 (four) hours as needed. pain   Yes Historical Provider, MD  promethazine (PHENERGAN) 25 MG tablet Take 25 mg by mouth every 6 (six) hours as needed.     Yes Historical Provider, MD  senna (SENOKOT) 8.6 MG tablet Take 2 tablets by mouth daily.     Yes Historical Provider, MD  vitamin E (VITAMIN E) 400 UNIT capsule Take 400 Units by mouth 2 (two) times daily.     Yes Historical Provider, MD  zolpidem (AMBIEN) 10 MG tablet Take 10 mg by mouth at bedtime as needed. sleep   Yes Historical Provider, MD     Allergies:  No Known Allergies  Social History:   reports  that he has been smoking Cigarettes.  He has a 7.5 pack-year smoking history. He has never used smokeless tobacco. He reports that he does not drink alcohol or use illicit drugs.  Family History: No family history on file.   Physical Exam: Filed Vitals:   10/15/11 1932 10/15/11 1933 10/15/11 2337  BP:  146/100 128/78  Pulse:  88 93  Temp:  98.4 F (36.9 C) 99.6 F (37.6 C)  TempSrc: Oral  Oral  Resp: 18  18  Height:  5\' 6"  (1.676 m)    Weight: 54.432 kg (120 lb)    SpO2:  99% 100%   Blood pressure 128/78, pulse 93, temperature 99.6 F (37.6 C), temperature source Oral, resp. rate 18, height 5\' 6"  (1.676 m), weight 54.432 kg (120 lb), SpO2 100.00%.  GEN:  Pleasant person lying in the stretcher in no acute distress; cooperative with exam PSYCH:  alert and oriented x4; does not appear anxious does not appear depressed; affect is normal HEENT: Mucous membranes pink and anicteric; PERRLA; EOM intact; no cervical lymphadenopathy nor thyromegaly or carotid bruit; no JVD; Breasts:: Not examined CHEST WALL: No tenderness CHEST: Normal respiration, clear to auscultation bilaterally HEART: Regular rate and rhythm; no murmurs rubs or gallops BACK: No kyphosis or scoliosis; no CVA tenderness ABDOMEN: Obese, soft, only slightly tender, no masses, no organomegaly, normal abdominal bowel sounds; no pannus; no intertriginous candida. No ascites, no rebound. Rectal Exam: Not done EXTREMITIES: No bone or joint deformity; age-appropriate arthropathy of the hands and knees; no edema; no ulcerations. Genitalia: not examined PULSES: 2+ and symmetric SKIN: Normal hydration no rash or ulceration CNS: Cranial nerves 2-12 grossly intact no focal neurologic deficit   Labs & Imaging Results for orders placed during the hospital encounter of 10/15/11 (from the past 48 hour(s))  COMPREHENSIVE METABOLIC PANEL     Status: Abnormal   Collection Time   10/15/11  9:14 PM      Component Value Range Comment   Sodium 136  135 - 145 (mEq/L)    Potassium 4.8  3.5 - 5.1 (mEq/L)    Chloride 98  96 - 112 (mEq/L)    CO2 24  19 - 32 (mEq/L)    Glucose, Bld 102 (*) 70 - 99 (mg/dL)    BUN 11  6 - 23 (mg/dL)    Creatinine, Ser 8.11  0.50 - 1.35 (mg/dL)    Calcium 91.4  8.4 - 10.5 (mg/dL)    Total Protein 9.4 (*) 6.0 - 8.3 (g/dL)    Albumin 4.5  3.5 - 5.2 (g/dL)    AST 29  0 - 37 (U/L)    ALT 22  0 - 53 (U/L)    Alkaline Phosphatase 106   39 - 117 (U/L)    Total Bilirubin 0.3  0.3 - 1.2 (mg/dL)    GFR calc non Af Amer >90  >90 (mL/min)    GFR calc Af Amer >90  >90 (mL/min)   LIPASE, BLOOD     Status: Abnormal   Collection Time   10/15/11  9:14 PM      Component Value Range Comment   Lipase 77 (*) 11 - 59 (U/L)   CBC     Status: Abnormal   Collection Time   10/15/11 10:00 PM      Component Value Range Comment   WBC 6.4  4.0 - 10.5 (K/uL)    RBC 4.16 (*) 4.22 - 5.81 (MIL/uL)    Hemoglobin 13.3  13.0 - 17.0 (g/dL)  HCT 40.2  39.0 - 52.0 (%)    MCV 96.6  78.0 - 100.0 (fL)    MCH 32.0  26.0 - 34.0 (pg)    MCHC 33.1  30.0 - 36.0 (g/dL)    RDW 16.1  09.6 - 04.5 (%)    Platelets 147 (*) 150 - 400 (K/uL)   DIFFERENTIAL     Status: Abnormal   Collection Time   10/15/11 10:00 PM      Component Value Range Comment   Neutrophils Relative 77  43 - 77 (%)    Neutro Abs 4.9  1.7 - 7.7 (K/uL)    Lymphocytes Relative 11 (*) 12 - 46 (%)    Lymphs Abs 0.7  0.7 - 4.0 (K/uL)    Monocytes Relative 12  3 - 12 (%)    Monocytes Absolute 0.8  0.1 - 1.0 (K/uL)    Eosinophils Relative 0  0 - 5 (%)    Eosinophils Absolute 0.0  0.0 - 0.7 (K/uL)    Basophils Relative 0  0 - 1 (%)    Basophils Absolute 0.0  0.0 - 0.1 (K/uL)    Ct Abdomen Pelvis W Contrast  10/15/2011  *RADIOLOGY REPORT*  Clinical Data: Upper abdominal pain, radiating to the back. History of pancreatitis.  CT ABDOMEN AND PELVIS WITH CONTRAST  Technique:  Multidetector CT imaging of the abdomen and pelvis was performed following the standard protocol during bolus administration of intravenous contrast.  Contrast: OMNIPAQUE IOHEXOL 300 MG/ML IV SOLN  Comparison: CT of the abdomen and pelvis performed 10/13/2010, and PET / CT performed 05/25/2011  Findings: Mild bibasilar atelectasis is noted.  Soft tissue inflammation is noted about the pancreas, with fluid seen tracking inferiorly from the tail of the pancreas.  There is decreased enhancement involving the pancreatic head and  neck, raising question for mild devascularization.  Findings are compatible with acute pancreatitis; underlying diffuse calcification within the pancreas reflects changes of chronic pancreatitis.  No definite recurrent pseudocysts are seen.  A collection of fluid noted anterior to the gallbladder fossa appears to reflect an adjacent small bowel loop.  The liver and spleen are unremarkable in appearance.  The patient is status post cholecystectomy, with clips noted at the gallbladder fossa.  The adrenal glands are unremarkable in appearance.  A 0.8 cm cyst is noted at the posterior aspect of the right kidney. There is a 1.1 cm cyst noted at the interpole region of the left kidney.  The kidneys are otherwise unremarkable in appearance. There is no evidence of hydronephrosis.  No renal or ureteral stones are seen.  No perinephric stranding is appreciated.  The small bowel is unremarkable in appearance.  The stomach is grossly unremarkable; the patient's G-tube is noted ending at the body of the stomach.  No acute vascular abnormalities are seen. There is diffuse calcification along the abdominal aorta and its branches, with luminal narrowing most prominent along the external iliac and common femoral arteries bilaterally.  The appendix is not definitely seen; there is no evidence for appendicitis.  The colon is partially filled with stool and is grossly unremarkable in appearance.  The bladder is moderately distended and within normal limits.  The prostate remains normal in size.  No inguinal lymphadenopathy is seen.  No acute osseous abnormalities are identified.  IMPRESSION:  1.  Acute pancreatitis noted, with fluid tracking inferiorly from the tail of the pancreas, and decreased enhancement involving the head and neck of the pancreas.  No definite evidence of  pseudocyst formation. 2.  Underlying changes of chronic pancreatitis, with scattered calcification noted. 3.  Diffuse calcification along the abdominal aorta  and its branches, with luminal narrowing along the external iliac and common femoral arteries bilaterally. 4.  Mild bibasilar atelectasis noted. 5.  Small bilateral renal cysts seen.  Original Report Authenticated By: Tonia Ghent, M.D.      Assessment Present on Admission:  .Chronic pancreatitis .Tonsillar cancer .HTN (hypertension) Acute on chronic pancreatitis Chronic pain management contract  PLAN: Will admit him for IV fluid, and better pain control. Notice that he is already on Duragesic patch before. In the hospital, I will give him IV Dilaudid. He is otherwise quite stable, full code, and will be [facility]. I will start him on clear liquids and see how he does. There is no evidence of infection and no antibiotic will be given.  Other plans as per orders.    Makilah Dowda 10/16/2011, 2:26 AM

## 2011-10-16 NOTE — Progress Notes (Signed)
[facility] Inpatient Progress Note  10/16/2011  Subjective: The patient is reporting that pain is much better controlled.  The abdominal pain persists but goes away with pain medication.  Pt says that his last BM was 2 nights ago.  He had a hard small BM at that time.  Pt says that he normally runs Jevity 1.5 tube feeds overnight when he is at home.    Objective:  Vital signs in last 24 hours: Filed Vitals:   10/15/11 2337 10/16/11 0120 10/16/11 0520 10/16/11 0620  BP: 128/78 155/92  122/67  Pulse: 93 78  58  Temp: 99.6 F (37.6 C) 99.2 F (37.3 C)  98.8 F (37.1 C)  TempSrc: Oral Oral  Oral  Resp: 18 18  18   Height:  5\' 6"  (1.676 m)    Weight:  51.5 kg (113 lb 8.6 oz) 51.619 kg (113 lb 12.8 oz)   SpO2: 100% 99%  100%   Weight change:   Intake/Output Summary (Last 24 hours) at 10/16/11 0830 Last data filed at 10/16/11 0600  Gross per 24 hour  Intake 401.67 ml  Output    350 ml  Net  51.67 ml    Review of Systems Pertinent items are noted in HPI.  Otherwise all reviewed and reported as negative  Physical Exam General appearance: alert, cooperative, appears stated age and no distress Head: Normocephalic, without obvious abnormality, atraumatic Eyes: negative Nose: no discharge Throat: MMM Lungs: clear to auscultation bilaterally Heart: S1, S2 normal Abdomen: soft, RUQ TTP, no guarding, BS normal Extremities: extremities normal, atraumatic, no cyanosis or edema Neurologic: Grossly normal  Lab Results: @labtest2 @  Micro Results: No results found for this or any previous visit (from the past 240 hour(s)).  Studies/Results: Ct Abdomen Pelvis W Contrast  10/15/2011  *RADIOLOGY REPORT*  Clinical Data: Upper abdominal pain, radiating to the back. History of pancreatitis.  CT ABDOMEN AND PELVIS WITH CONTRAST  Technique:  Multidetector CT imaging of the abdomen and pelvis was performed following the standard protocol during bolus administration of intravenous  contrast.  Contrast: OMNIPAQUE IOHEXOL 300 MG/ML IV SOLN  Comparison: CT of the abdomen and pelvis performed 10/13/2010, and PET / CT performed 05/25/2011  Findings: Mild bibasilar atelectasis is noted.  Soft tissue inflammation is noted about the pancreas, with fluid seen tracking inferiorly from the tail of the pancreas.  There is decreased enhancement involving the pancreatic head and neck, raising question for mild devascularization.  Findings are compatible with acute pancreatitis; underlying diffuse calcification within the pancreas reflects changes of chronic pancreatitis.  No definite recurrent pseudocysts are seen.  A collection of fluid noted anterior to the gallbladder fossa appears to reflect an adjacent small bowel loop.  The liver and spleen are unremarkable in appearance.  The patient is status post cholecystectomy, with clips noted at the gallbladder fossa.  The adrenal glands are unremarkable in appearance.  A 0.8 cm cyst is noted at the posterior aspect of the right kidney. There is a 1.1 cm cyst noted at the interpole region of the left kidney.  The kidneys are otherwise unremarkable in appearance. There is no evidence of hydronephrosis.  No renal or ureteral stones are seen.  No perinephric stranding is appreciated.  The small bowel is unremarkable in appearance.  The stomach is grossly unremarkable; the patient's G-tube is noted ending at the body of the stomach.  No acute vascular abnormalities are seen. There is diffuse calcification along the abdominal aorta and its branches, with luminal  narrowing most prominent along the external iliac and common femoral arteries bilaterally.  The appendix is not definitely seen; there is no evidence for appendicitis.  The colon is partially filled with stool and is grossly unremarkable in appearance.  The bladder is moderately distended and within normal limits.  The prostate remains normal in size.  No inguinal lymphadenopathy is seen.  No acute  osseous abnormalities are identified.  IMPRESSION:  1.  Acute pancreatitis noted, with fluid tracking inferiorly from the tail of the pancreas, and decreased enhancement involving the head and neck of the pancreas.  No definite evidence of pseudocyst formation. 2.  Underlying changes of chronic pancreatitis, with scattered calcification noted. 3.  Diffuse calcification along the abdominal aorta and its branches, with luminal narrowing along the external iliac and common femoral arteries bilaterally. 4.  Mild bibasilar atelectasis noted. 5.  Small bilateral renal cysts seen.  Original Report Authenticated By: Tonia Ghent, M.D.    Medications:  Scheduled Meds:   . antiseptic oral rinse  15 mL Mouth Rinse q12n4p  . chlorhexidine  15 mL Mouth Rinse BID  . cholecalciferol  1,000 Units Oral Daily  . fentaNYL  50 mcg Transdermal Q72H  .  HYDROmorphone (DILAUDID) injection  1 mg Intravenous Once  .  HYDROmorphone (DILAUDID) injection  1 mg Intravenous Once  . lipase/protease/amylase  1 capsule Oral TID WC & HS  . metoprolol  50 mg Oral BID  . omega-3 acid ethyl esters  1 g Oral Daily  . ondansetron  4 mg Intravenous Once  . ondansetron  4 mg Intravenous Once  . pantoprazole  80 mg Oral Q1200  . senna  2 tablet Oral Daily  . vitamin E  400 Units Oral BID  . DISCONTD: amylase-lipase-protease  1 capsule Oral QID  . DISCONTD: antiseptic oral rinse  15 mL Mouth Rinse BID  . DISCONTD: fish oil-omega-3 fatty acids  1 g Oral Daily  . DISCONTD: lipase/protease/amylase  1 capsule Oral QID   Continuous Infusions:   . dextrose 5 % and 0.9% NaCl 100 mL/hr at 10/16/11 0159  . DISCONTD: sodium chloride 125 mL/hr at 10/15/11 2059   PRN Meds:.HYDROmorphone, iohexol, LORazepam, magic mouthwash w/lidocaine, ondansetron (ZOFRAN) IV, ondansetron, zolpidem  Assessment/Plan:  Chronic pancreatitis   - Continue current management, IV pain meds, nausea meds prn    Acute pancreatitis (10/16/2011)  -Continue NPO  for now, holding tube feeds, will try clears for supper, and if tolerated consider starting TF tomorrow evening  -Continue IVFs, IV dilaudid  -Add lipase to am labs, recheck lipase in AM  Tonsillar cancer (10/16/2011)  stable  HTN (hypertension) (10/16/2011)   -controlled when pain is controlled    LOS: 1 day   Clanford Johnson 10/16/2011, 8:30 AM   Cleora Fleet, MD, CDE, FAAFP [facility] [place], [place]  [phone]

## 2011-10-17 LAB — CBC
Hemoglobin: 11 g/dL — ABNORMAL LOW (ref 13.0–17.0)
MCHC: 33.1 g/dL (ref 30.0–36.0)
RDW: 15.2 % (ref 11.5–15.5)
WBC: 2.9 10*3/uL — ABNORMAL LOW (ref 4.0–10.5)

## 2011-10-17 LAB — COMPREHENSIVE METABOLIC PANEL
ALT: 15 U/L (ref 0–53)
Alkaline Phosphatase: 76 U/L (ref 39–117)
BUN: 5 mg/dL — ABNORMAL LOW (ref 6–23)
CO2: 27 mEq/L (ref 19–32)
Calcium: 8.8 mg/dL (ref 8.4–10.5)
GFR calc Af Amer: >90 mL/min (ref >90)
GFR calc non Af Amer: >90 mL/min (ref >90)
Glucose, Bld: 112 mg/dL — ABNORMAL HIGH (ref 70–99)
Sodium: 137 mEq/L (ref 135–145)

## 2011-10-17 MED ORDER — JEVITY 1.2 CAL PO LIQD: 1000.0000 mL | ORAL | Status: DC

## 2011-10-17 MED ORDER — OXYCODONE HCL 5 MG PO TABS
15.0000 mg | ORAL_TABLET | ORAL | Status: DC | PRN
  Administered 2011-10-17 – 2011-10-19 (×10): 15 mg via ORAL
  Filled 2011-10-17 (×7): qty 3
  Filled 2011-10-17: qty 1
  Filled 2011-10-17 (×3): qty 3

## 2011-10-17 MED ORDER — HYDROMORPHONE HCL PF 1 MG/ML IJ SOLN
1.0000 mg | Freq: Four times a day (QID) | INTRAMUSCULAR | Status: DC | PRN
  Administered 2011-10-17 – 2011-10-18 (×3): 1 mg via INTRAVENOUS
  Administered 2011-10-18: 2 mg via INTRAVENOUS
  Filled 2011-10-17 (×3): qty 1
  Filled 2011-10-17: qty 2

## 2011-10-17 NOTE — Progress Notes (Signed)
Subjective: Patient seen and examined,c/o intermittent abdominal pain   Objective: Vital signs in last 24 hours: Temp:  [97.5 F (36.4 C)-98 F (36.7 C)] 98 F (36.7 C) (01/13 0600) Pulse Rate:  [49-64] 64  (01/13 1118) Resp:  [18-20] 18  (01/13 0600) BP: (118-128)/(73-76) 118/76 mmHg (01/13 0600) SpO2:  [96 %-97 %] 96 % (01/13 0600) Weight change:  Last BM Date: 10/15/11  Intake/Output from previous day: 01/12 0701 - 01/13 0700 In: 1906.7 [P.O.:350; I.V.:1556.7] Out: 1300 [Urine:1300] Total I/O In: 240 [P.O.:240] Out: -    Physical Exam: General: Alert, awake, oriented x3, in no acute distress.Marland Kitchen Heart: Regular rate and rhythm, without murmurs, rubs, gallops. Lungs: Clear to auscultation bilaterally. Abdomen: Soft, nontender, nondistended, positive bowel sounds.PEG in place  Extremities: No clubbing cyanosis or edema with positive pedal pulses. Neuro: Grossly intact, nonfocal.    Lab Results: Results for orders placed during the hospital encounter of 10/15/11 (from the past 24 hour(s))  LIPASE, BLOOD     Status: Normal   Collection Time   10/17/11  4:05 AM      Component Value Range   Lipase 17  11 - 59 (U/L)  COMPREHENSIVE METABOLIC PANEL     Status: Abnormal   Collection Time   10/17/11  4:05 AM      Component Value Range   Sodium 137  135 - 145 (mEq/L)   Potassium 3.9  3.5 - 5.1 (mEq/L)   Chloride 105  96 - 112 (mEq/L)   CO2 27  19 - 32 (mEq/L)   Glucose, Bld 112 (*) 70 - 99 (mg/dL)   BUN 5 (*) 6 - 23 (mg/dL)   Creatinine, Ser 1.61  0.50 - 1.35 (mg/dL)   Calcium 8.8  8.4 - 09.6 (mg/dL)   Total Protein 7.2  6.0 - 8.3 (g/dL)   Albumin 3.3 (*) 3.5 - 5.2 (g/dL)   AST 18  0 - 37 (U/L)   ALT 15  0 - 53 (U/L)   Alkaline Phosphatase 76  39 - 117 (U/L)   Total Bilirubin 0.4  0.3 - 1.2 (mg/dL)   GFR calc non Af Amer >90  >90 (mL/min)   GFR calc Af Amer >90  >90 (mL/min)  CBC     Status: Abnormal   Collection Time   10/17/11  4:05 AM      Component Value Range   WBC 2.9 (*) 4.0 - 10.5 (K/uL)   RBC 3.39 (*) 4.22 - 5.81 (MIL/uL)   Hemoglobin 11.0 (*) 13.0 - 17.0 (g/dL)   HCT 04.5 (*) 40.9 - 52.0 (%)   MCV 97.9  78.0 - 100.0 (fL)   MCH 32.4  26.0 - 34.0 (pg)   MCHC 33.1  30.0 - 36.0 (g/dL)   RDW 81.1  91.4 - 78.2 (%)   Platelets 137 (*) 150 - 400 (K/uL)    Studies/Results:  Medications:    . antiseptic oral rinse  15 mL Mouth Rinse q12n4p  . chlorhexidine  15 mL Mouth Rinse BID  . cholecalciferol  1,000 Units Oral Daily  . docusate sodium  100 mg Oral BID  . fentaNYL  50 mcg Transdermal Q72H  . lipase/protease/amylase  1 capsule Oral TID WC & HS  . metoprolol  50 mg Oral BID  . omega-3 acid ethyl esters  1 g Oral Daily  . pantoprazole  80 mg Oral Q1200  . senna  2 tablet Oral Daily  . vitamin E  400 Units Oral BID    HYDROmorphone,  LORazepam, magic mouthwash w/lidocaine, ondansetron (ZOFRAN) IV, ondansetron, oxyCODONE, zolpidem, DISCONTD: HYDROmorphone     . dextrose 5 % and 0.9% NaCl 100 mL/hr at 10/16/11 2134    Assessment/Plan:  Acute on chronic pancreatitis  Tolerating clears ,will resume overnight  TF as per dietician recommendations Taper IV dilaudid and resume oxycodone for pain control  [id number] continue meds Tonsillar CA : stable  LOS: 2 days   Russell Williamson 10/17/2011, 2:05 PM

## 2011-10-17 NOTE — Progress Notes (Signed)
INITIAL ADULT NUTRITION ASSESSMENT Date: 10/17/2011   Time: 2:04 PM  Reason for Assessment: Consult, TF  ASSESSMENT: Male 56 y.o.  Dx: Tonsillar cancer with dysphagia, acute pancreatitis  Hx:  Past Medical History  Diagnosis Date  . Tonsillar cancer     tonsillar ca dx 12/27/10  . Hypertension   . Chronic pancreatitis     Related Meds:     . antiseptic oral rinse  15 mL Mouth Rinse q12n4p  . chlorhexidine  15 mL Mouth Rinse BID  . cholecalciferol  1,000 Units Oral Daily  . docusate sodium  100 mg Oral BID  . fentaNYL  50 mcg Transdermal Q72H  . lipase/protease/amylase  1 capsule Oral TID WC & HS  . metoprolol  50 mg Oral BID  . omega-3 acid ethyl esters  1 g Oral Daily  . pantoprazole  80 mg Oral Q1200  . senna  2 tablet Oral Daily  . vitamin E  400 Units Oral BID    Ht: 5\' 6"  (167.6 cm)  Wt: 113 lb 12.8 oz (51.619 kg)  Ideal Wt: 64.5 % Ideal Wt: 80%   Usual Wt: Unknown % Usual Wt: Unknown  Body mass index is 18.37 kg/(m^2).- Underweight  Food/Nutrition Related Hx: Patient reports running Jevity 1.5 via PEG tube at night (4 cans at 90 ml/hr) with up to an additional 2 cans during the day. 6 cans will provide 2133 kcal, 91 g protein, 1081 ml free water. He does tolerate some food by mouth, but only liquids and very soft/watery foods. Diet advanced to clear liquids. He denies abdominal pain, nausea, vomiting, but does state some difficulty swallowing. Per MD note, if he tolerates oral intake, may consider starting TF this evening.   Labs:  CMP     Component Value Date/Time   NA 137 10/17/2011 0405   K 3.9 10/17/2011 0405   CL 105 10/17/2011 0405   CO2 27 10/17/2011 0405   GLUCOSE 112* 10/17/2011 0405   BUN 5* 10/17/2011 0405   CREATININE 0.54 10/17/2011 0405   CALCIUM 8.8 10/17/2011 0405   PROT 7.2 10/17/2011 0405   ALBUMIN 3.3* 10/17/2011 0405   AST 18 10/17/2011 0405   ALT 15 10/17/2011 0405   ALKPHOS 76 10/17/2011 0405   BILITOT 0.4 10/17/2011 0405   GFRNONAA >90  10/17/2011 0405   GFRAA >90 10/17/2011 0405    Intake/Output Summary (Last 24 hours) at 10/17/11 1409 Last data filed at 10/17/11 0454  Gross per 24 hour  Intake 2146.67 ml  Output   1300 ml  Net 846.67 ml    Diet Order: Clear Liquid  Supplements/Tube Feeding: None  IVF:    dextrose 5 % and 0.9% NaCl Last Rate: 100 mL/hr at 10/16/11 2134    Estimated Nutritional Needs:   Kcal:1800-2050 kcal Protein:77-93 g Fluid:1.8 L  NUTRITION DIAGNOSIS: -Inadequate protein energy intake (NI-5.3).  Status: Ongoing  RELATED TO: acute pancreatitis  AS EVIDENCE BY: NPO/Clear liquid status  MONITORING/EVALUATION(Goals): Patient will meet 90-100% of estimated nutrition needs with enteral nutrition and supplemental oral intake.   Monitor: TF initiation/tolerance, weight, labs.   EDUCATION NEEDS: -No education needs identified at this time  INTERVENTION: 1. When appropriate, reinitiate enteral nutrition via PEG. Recommend Jevity 1.5 run continuously overnight for 14 hours (8pm-10am) at 90 ml/hr, which will provide 1890 kcal, 80 g protein, and 958 ml free water.  2. Supplemental diet as tolerated.   DOCUMENTATION CODES Per approved criteria  -Underweight    Fabio Pierce 10/17/2011,  2:04 PM

## 2011-10-18 LAB — CBC
HCT: 31.4 % — ABNORMAL LOW (ref 39.0–52.0)
Hemoglobin: 10.4 g/dL — ABNORMAL LOW (ref 13.0–17.0)
MCH: 32.1 pg (ref 26.0–34.0)
MCV: 96.9 fL (ref 78.0–100.0)
RBC: 3.24 MIL/uL — ABNORMAL LOW (ref 4.22–5.81)

## 2011-10-18 MED ORDER — JEVITY 1.5 CAL PO LIQD
1000.0000 mL | ORAL | Status: DC
  Filled 2011-10-18 (×2): qty 1000

## 2011-10-18 MED ORDER — JEVITY 1.5 CAL PO LIQD
1000.0000 mL | ORAL | Status: DC
  Administered 2011-10-18: 1000 mL
  Filled 2011-10-18 (×2): qty 1000

## 2011-10-18 NOTE — Progress Notes (Signed)
Jevity 1.5 initiated via LLQ feeding tube by patient @ 52mls/hr/pump. Observed patient during procedure. He completed it appropriately without difficulty.

## 2011-10-18 NOTE — Progress Notes (Signed)
Nutrition Brief Note  - Pt resumed home TF last night of Jevity 1.5 at 70ml/hr from 2000-1000 daily via PEG. Pt denies any complaints regarding TF, denies any nausea/vomiting, reports tolerating TF well. Will continue to monitor for TF tolerance and meal intake. Noted pt did not eat any of clear liquid breakfast tray due to pt stating he typically eats breakfast around 11am and does not like to eat this early. Will monitor.   Dietitian # [phone]

## 2011-10-18 NOTE — Progress Notes (Addendum)
Subjective:  Patient seen and examined , reported improvement in abdominal pain with pain medicine however not eating much by mouth.  Objective: Vital signs in last 24 hours: Temp:  [97.9 F (36.6 C)-99 F (37.2 C)] 99 F (37.2 C) (01/14 1404) Pulse Rate:  [55-74] 61  (01/14 1404) Resp:  [17-18] 17  (01/14 1404) BP: (129-140)/(77-84) 137/83 mmHg (01/14 1404) SpO2:  [93 %-95 %] 95 % (01/14 1404) Weight change:  Last BM Date: 10/15/11  Intake/Output from previous day: 01/13 0701 - 01/14 0700 In: 4618.3 [P.O.:1030; I.V.:2988.3] Out: 1550 [Urine:1550] Total I/O In: 60 [P.O.:60] Out: 301 [Urine:300; Stool:1]   Physical Exam: General: Alert, awake, oriented x3, in no acute distress. Heart: Regular rate and rhythm, without murmurs, rubs, gallops. Lungs: Clear to auscultation bilaterally. Abdomen: Soft, nontender, nondistended, positive bowel sounds.PEG in place  Extremities: No clubbing cyanosis or edema with positive pedal pulses. Neuro: Grossly intact, nonfocal.    Lab Results: Results for orders placed during the hospital encounter of 10/15/11 (from the past 24 hour(s))  CBC     Status: Abnormal   Collection Time   10/18/11  3:40 AM      Component Value Range   WBC 2.3 (*) 4.0 - 10.5 (K/uL)   RBC 3.24 (*) 4.22 - 5.81 (MIL/uL)   Hemoglobin 10.4 (*) 13.0 - 17.0 (g/dL)   HCT 02.7 (*) 25.3 - 52.0 (%)   MCV 96.9  78.0 - 100.0 (fL)   MCH 32.1  26.0 - 34.0 (pg)   MCHC 33.1  30.0 - 36.0 (g/dL)   RDW 66.4  40.3 - 47.4 (%)   Platelets 132 (*) 150 - 400 (K/uL)    Studies/Results: No results found.  Medications:    . antiseptic oral rinse  15 mL Mouth Rinse q12n4p  . chlorhexidine  15 mL Mouth Rinse BID  . cholecalciferol  1,000 Units Oral Daily  . docusate sodium  100 mg Oral BID  . fentaNYL  50 mcg Transdermal Q72H  . Jevity 1.5 Cal  1,000 mL Per Tube Custom  . lipase/protease/amylase  1 capsule Oral TID WC & HS  . metoprolol  50 mg Oral BID  . omega-3 acid ethyl  esters  1 g Oral Daily  . pantoprazole  80 mg Oral Q1200  . senna  2 tablet Oral Daily  . vitamin E  400 Units Oral BID  . DISCONTD: feeding supplement (JEVITY 1.2)  1,000 mL Per Tube Custom  . DISCONTD: Jevity 1.5 Cal  1,000 mL Per Tube Custom    HYDROmorphone, LORazepam, magic mouthwash w/lidocaine, ondansetron (ZOFRAN) IV, ondansetron, oxyCODONE, zolpidem     . dextrose 5 % and 0.9% NaCl 75 mL/hr at 10/18/11 2595    Assessment/Plan:  Acute on chronic pancreatitis  Improving Poor by mouth intake ,TF was resumed overnight and he tolerated well. Appreciate  dietician followup and we will await further recommendation regarding adjusting tube feeding schedule/rate based on his by mouth intake.  Continue oxycodone for pain control . Continue Creon GLO:[id number] continue meds  Tonsillar CA : stable Pancytopenia;? Etiology, possibly dilutional. Will DC IV fluids and follow repeat labs in the a.m. >PT consult pending    LOS: 3 days   Russell Williamson 10/18/2011, 2:29 PM

## 2011-10-19 MED ORDER — JEVITY 1.5 CAL PO LIQD
1000.0000 mL | ORAL | Status: DC
  Administered 2011-10-19 (×2): 1000 mL
  Filled 2011-10-19 (×4): qty 1000

## 2011-10-19 MED ORDER — OXYCODONE HCL 5 MG PO TABS
15.0000 mg | ORAL_TABLET | ORAL | Status: DC | PRN
  Administered 2011-10-19 – 2011-10-20 (×7): 15 mg via ORAL
  Filled 2011-10-19 (×7): qty 3

## 2011-10-19 NOTE — Progress Notes (Signed)
Nutrition Brief Note  - Pt voiced concerns over not getting TF during the day, however pt told RD on Sunday that he prefers to run it during the night. Pt interested in switching TF during day. Discussed with MD, obtained telephone order to change TF to Jevity 1.5 at 73ml/hr for 16 hours during the day which will meet 100% of his nutritional needs. This will provide 2160 calories, 92g protein, 32g fiber, and free water. Recommend free water flushes via PEG 5 times daily to meet total fluid needs ( total water) if pt goes home NPO. Noted lipase now WNL and MD plans to possibly d/c pt home today. Will monitor.   Dietitian # [phone]

## 2011-10-19 NOTE — Progress Notes (Signed)
Russell Williamson  ZOX:[id number]  DOB: August 15, 1956  DOA: 10/15/2011  PCP: No primary provider on file.  Subjective: Patient seen and examined, reports frustration over the pain medications and pain still not very well controlled.  Objective: Weight change:   Intake/Output Summary (Last 24 hours) at 10/19/11 1430 Last data filed at 10/19/11 1300  Gross per 24 hour  Intake   2050 ml  Output   1400 ml  Net    650 ml   Blood pressure 129/73, pulse 69, temperature 98 F (36.7 C), temperature source Oral, resp. rate 18, height 5\' 6"  (1.676 m), weight 51.619 kg (113 lb 12.8 oz), SpO2 95.00%.  Physical Exam: General: Alert and awake, oriented x3, not in any acute distress. HEENT: anicteric sclera, pupils reactive to light and accommodation, EOMI CVS: S1-S2 clear, no murmur rubs or gallops Chest: clear to auscultation bilaterally, no wheezing, rales or rhonchi Abdomen: soft, epigastric tenderness, PEG tube in place Extremities: no cyanosis, clubbing or edema noted bilaterally Neuro: Cranial nerves II-XII intact, no focal neurological deficits  Lab Results: Basic Metabolic Panel:  Lab 10/17/11 8119 10/16/11 0400  NA 137 136  K 3.9 3.7  CL 105 102  CO2 27 27  GLUCOSE 112* 90  BUN 5* 9  CREATININE 0.54 0.57  CALCIUM 8.8 9.1  MG -- --  PHOS -- --   Liver Function Tests:  Lab 10/17/11 0405 10/15/11 2114  AST 18 29  ALT 15 22  ALKPHOS 76 106  BILITOT 0.4 0.3  PROT 7.2 9.4*  ALBUMIN 3.3* 4.5    Lab 10/17/11 0405 10/16/11 0400  LIPASE 17 45  AMYLASE -- --   CBC:  Lab 10/18/11 0340 10/17/11 0405 10/15/11 2200  WBC 2.3* 2.9* --  NEUTROABS -- -- 4.9  HGB 10.4* 11.0* --  HCT 31.4* 33.2* --  MCV 96.9 97.9 --  PLT 132* 137* --    Studies/Results: Ct Abdomen Pelvis W Contrast  10/15/2011  *RADIOLOGY REPORT*  Clinical Data: Upper abdominal pain, radiating to the back. History of pancreatitis.  CT ABDOMEN AND PELVIS WITH CONTRAST  Technique:  Multidetector CT imaging of  the abdomen and pelvis was performed following the standard protocol during bolus administration of intravenous contrast.  Contrast: OMNIPAQUE IOHEXOL 300 MG/ML IV SOLN  Comparison: CT of the abdomen and pelvis performed 10/13/2010, and PET / CT performed 05/25/2011  Findings: Mild bibasilar atelectasis is noted.  Soft tissue inflammation is noted about the pancreas, with fluid seen tracking inferiorly from the tail of the pancreas.  There is decreased enhancement involving the pancreatic head and neck, raising question for mild devascularization.  Findings are compatible with acute pancreatitis; underlying diffuse calcification within the pancreas reflects changes of chronic pancreatitis.  No definite recurrent pseudocysts are seen.  A collection of fluid noted anterior to the gallbladder fossa appears to reflect an adjacent small bowel loop.  The liver and spleen are unremarkable in appearance.  The patient is status post cholecystectomy, with clips noted at the gallbladder fossa.  The adrenal glands are unremarkable in appearance.  A 0.8 cm cyst is noted at the posterior aspect of the right kidney. There is a 1.1 cm cyst noted at the interpole region of the left kidney.  The kidneys are otherwise unremarkable in appearance. There is no evidence of hydronephrosis.  No renal or ureteral stones are seen.  No perinephric stranding is appreciated.  The small bowel is unremarkable in appearance.  The stomach is grossly unremarkable; the patient's G-tube is  noted ending at the body of the stomach.  No acute vascular abnormalities are seen. There is diffuse calcification along the abdominal aorta and its branches, with luminal narrowing most prominent along the external iliac and common femoral arteries bilaterally.  The appendix is not definitely seen; there is no evidence for appendicitis.  The colon is partially filled with stool and is grossly unremarkable in appearance.  The bladder is moderately distended and  within normal limits.  The prostate remains normal in size.  No inguinal lymphadenopathy is seen.  No acute osseous abnormalities are identified.  IMPRESSION:  1.  Acute pancreatitis noted, with fluid tracking inferiorly from the tail of the pancreas, and decreased enhancement involving the head and neck of the pancreas.  No definite evidence of pseudocyst formation. 2.  Underlying changes of chronic pancreatitis, with scattered calcification noted. 3.  Diffuse calcification along the abdominal aorta and its branches, with luminal narrowing along the external iliac and common femoral arteries bilaterally. 4.  Mild bibasilar atelectasis noted. 5.  Small bilateral renal cysts seen.  Original Report Authenticated By: Tonia Ghent, M.D.    Medications: Scheduled Meds:   . antiseptic oral rinse  15 mL Mouth Rinse q12n4p  . chlorhexidine  15 mL Mouth Rinse BID  . cholecalciferol  1,000 Units Oral Daily  . docusate sodium  100 mg Oral BID  . fentaNYL  50 mcg Transdermal Q72H  . Jevity 1.5 Cal  1,000 mL Per Tube Custom  . lipase/protease/amylase  1 capsule Oral TID WC & HS  . metoprolol  50 mg Oral BID  . omega-3 acid ethyl esters  1 g Oral Daily  . pantoprazole  80 mg Oral Q1200  . senna  2 tablet Oral Daily  . vitamin E  400 Units Oral BID  . DISCONTD: Jevity 1.5 Cal  1,000 mL Per Tube Custom   Continuous Infusions:   . DISCONTD: dextrose 5 % and 0.9% NaCl 75 mL/hr at 10/18/11 1520     Assessment/Plan: Active Problems:  Acute on Chronic pancreatitis: - Continue tube feeds, tolerating well. - Increased pain medications, immediate release oxycodone 15 mg Q3 hours when necessary. Continue Duragesic patch.    Tonsillar cancer: Stable   HTN (hypertension): Stable  Pancytopenia: Unclear etiology, continue to monitor the counts closely  DVT Prophylaxis: SCDs  Code Status: Full code  Disposition: To home in a.m. if the pain is controlled, patient has a pain management clinic appointment  on Thursday, 10/21/2011 at 11 AM. Discussed in detail with patient's sister on the phone per his request who agrees with the disposition.   LOS: 4 days   RAI,RIPUDEEP M.D. [facility] 10/19/2011, 2:30 PM

## 2011-10-19 NOTE — Progress Notes (Signed)
PT Screen Pt states he has been up walking in the room without difficulty.  He says he hopes to go home today and does not need any PT Ebony Hail, PT

## 2011-10-19 NOTE — Progress Notes (Signed)
   CARE MANAGEMENT NOTE 10/19/2011  Patient:  Russell Williamson, Russell Williamson   Account Number:  [id number]  Date Initiated:  10/19/2011  Documentation initiated by:  Lanier Clam  Subjective/Objective Assessment:   ADMITTED W/ABD PAIN.     Action/Plan:   FROM HOME W/FAMILY.RECEIVES PEG FEEDING NUTRITION SUPPLIES THROUGH [facility].   Anticipated DC Date:  10/19/2011   Anticipated DC Plan:  HOME/SELF CARE         Choice offered to / List presented to:             Status of service:  Completed, signed off Medicare Important Message given?   (If response is "NO", the following Medicare IM given date fields will be blank) Date Medicare IM given:   Date Additional Medicare IM given:    Discharge Disposition:  HOME/SELF CARE  Per UR Regulation:  Reviewed for med. necessity/level of care/duration of stay  Comments:  10/19/11 [facility] Christ Fullenwider RN,BSN [phone]

## 2011-10-20 ENCOUNTER — Encounter: Payer: Self-pay | Admitting: *Deleted

## 2011-10-20 ENCOUNTER — Other Ambulatory Visit: Payer: Self-pay | Admitting: Oncology

## 2011-10-20 MED ORDER — JEVITY 1.5 CAL PO LIQD: 1000.0000 mL | Freq: Every day | ORAL | Status: DC

## 2011-10-20 NOTE — Discharge Summary (Signed)
Physician Discharge Summary  Patient ID: Russell Williamson MRN: [id number] DOB/AGE: 1956-02-26 56 y.o.  Admit date: 10/15/2011 Discharge date: 10/20/2011  Primary Care Physician:  No primary provider on file. Oncologist Dr. Jethro Bolus Radiation oncologist: Dr. Jonna Coup   Discharge Diagnoses:   1.Acute on chronic pancreatitis 2. 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 HTN (hypertension) 4. Dysphagia status post PEG 5. Chronic pain 6. Protein calorie malnutrition 7. Pancytopenia   Discharge Medication List as of 10/20/2011 10:32 AM    START taking these medications   Details  Jevity 1.5 Cal (JEVITY 1.5) LIQD Place 1,000 mLs into feeding tube daily., Starting 10/20/2011, Until Discontinued, No Print      CONTINUE these medications which have NOT CHANGED   Details  Alum & Mag Hydroxide-Simeth (MAGIC MOUTHWASH W/LIDOCAINE) SOLN Take 5 mLs by mouth 4 (four) times daily as needed (Swish and Spit)., Starting 08/24/2011, Until Discontinued, Normal    Cholecalciferol (VITAMIN D) 1000 UNITS capsule Take 1,000 Units by mouth daily., Until Discontinued, Historical Med    fentaNYL (DURAGESIC - DOSED MCG/HR) 50 MCG/HR Place 1 patch onto the skin every 3 (three) days., Until Discontinued, Historical Med    fish oil-omega-3 fatty acids 1000 MG capsule Take 1 g by mouth daily., Until Discontinued, Historical Med    lipase/protease/amylase (CREON) 12000 UNITS CPEP Take 1 capsule by mouth 4 (four) times daily., Until Discontinued, Historical Med    LORazepam (ATIVAN) 0.5 MG tablet Take 0.5 mg by mouth every 6 (six) hours as needed.  , Until Discontinued, Historical Med    metoprolol (LOPRESSOR) 50 MG tablet Take 50 mg by mouth 2 (two) times daily., Until Discontinued, Historical Med    omeprazole (PRILOSEC) 40 MG capsule Take 40 mg by mouth daily. , Until Discontinued, Historical Med    oxyCODONE (ROXICODONE) 15 MG immediate release tablet Take 15 mg by mouth every 4 (four)  hours as needed. pain, Until Discontinued, Historical Med    promethazine (PHENERGAN) 25 MG tablet Take 25 mg by mouth every 6 (six) hours as needed.  , Until Discontinued, Historical Med    senna (SENOKOT) 8.6 MG tablet Take 2 tablets by mouth daily.  , Until Discontinued, Historical Med    vitamin E (VITAMIN E) 400 UNIT capsule Take 400 Units by mouth 2 (two) times daily.  , Until Discontinued, Historical Med    zolpidem (AMBIEN) 10 MG tablet Take 10 mg by mouth at bedtime as needed. sleep, Until Discontinued, Historical Med      STOP taking these medications     amylase-lipase-protease (LIPRAM-CR10) 33.11-14-35.5 MU per capsule          Disposition and Follow-up:  1. Dr. Gaylyn Rong in 1-2 weeks  Consults: none   Significant Diagnostic Studies:  Ct Abdomen Pelvis W Contrast 10/15/2011  IMPRESSION:  1.  Acute pancreatitis noted, with fluid tracking inferiorly from the tail of the pancreas, and decreased enhancement involving the head and neck of the pancreas.  No definite evidence of pseudocyst formation. 2.  Underlying changes of chronic pancreatitis, with scattered calcification noted. 3.  Diffuse calcification along the abdominal aorta and its branches, with luminal narrowing along the external iliac and common femoral arteries bilaterally. 4.  Mild bibasilar atelectasis noted. 5.  Small bilateral renal cysts seen.  Original Report Authenticated By: Tonia Ghent, M.D.    Brief H and P: Russell Williamson is an 56 y.o. male with history of tonsillar cancer making him having dysphasia, status post G-tube placement,  chronic pancreatitis on chronic narcotic, presents to [facility] long emergency room complaining of epigastric pain Evaluation in the emergency room included a lipase of 77, normal white count and hemoglobin, and abdominal pelvic CT showed acute on chronic pancreatitis, with fluid tracking inferiorly from the tail of the pancreas, but no evidence of pancreatic pseudocyst and no  abscess.   Hospital Course:  1. Acute on chronic pancreatitis Treated conservatively with IV fluids, bowel rest, narcotics and anti-emetics for symptom control, Clinically improved with the above regimen he was restarted on his tube feeds which he's been tolerating well and pain control has been reasonably achieved with his short acting oxycodone and Duragesic patch 2. Squamous cell carcinoma of tonsil: Status post chemotherapy and ongoing XRT, followup with Dr. Gaylyn Rong and [facility] 3. Pancytopenia: This is been a chronic issue etiology not clear, advised to followup with his oncologist Dr.Ha who is monitoring his blood counts.   Time spent on Discharge:  Signed: Leathia Farnell [facility]  10/20/2011, 3:29 PM

## 2011-10-21 ENCOUNTER — Encounter: Payer: Self-pay | Admitting: Oncology

## 2011-10-21 NOTE — Progress Notes (Signed)
Will just cancel.  However, please document who you talk to and who canceled it for documentation. Thanks.

## 2011-10-21 NOTE — Progress Notes (Unsigned)
10/21/2011  Good Morning,  Russell Williamson is scheduled for ct scan of the neck on 10/25/2011. This procedure has been denied by [facility] Medicaid.  Would you like to do a peer-to-peer or just cancel this procedure?

## 2011-10-25 ENCOUNTER — Encounter: Payer: Self-pay | Admitting: *Deleted

## 2011-10-26 ENCOUNTER — Other Ambulatory Visit (HOSPITAL_COMMUNITY): Payer: Self-pay

## 2011-10-26 ENCOUNTER — Ambulatory Visit (HOSPITAL_COMMUNITY): Payer: Self-pay | Admitting: Dentistry

## 2011-10-26 ENCOUNTER — Other Ambulatory Visit: Payer: Self-pay | Admitting: Lab

## 2011-10-26 VITALS — BP 129/82 | HR 54 | Temp 97.6°F

## 2011-10-26 DIAGNOSIS — M272 Inflammatory conditions of jaws: Secondary | ICD-10-CM

## 2011-10-26 DIAGNOSIS — K062 Gingival and edentulous alveolar ridge lesions associated with trauma: Secondary | ICD-10-CM

## 2011-10-26 DIAGNOSIS — K08109 Complete loss of teeth, unspecified cause, unspecified class: Secondary | ICD-10-CM

## 2011-10-26 DIAGNOSIS — K117 Disturbances of salivary secretion: Secondary | ICD-10-CM

## 2011-10-26 MED ORDER — CHLORHEXIDINE GLUCONATE 0.12 % MT SOLN: OROMUCOSAL | Status: DC

## 2011-10-26 MED ORDER — CHLORHEXIDINE GLUCONATE 0.12 % MT SOLN: OROMUCOSAL | Status: AC

## 2011-10-26 NOTE — Progress Notes (Signed)
Tuesday, October 26, 2011   BP: 129/82             P: 54            T: 97.6  Russell Williamson presents for evaluation of upper and lower complete dentures. S: Patient is complaining of denture irritation to the lower right quadrant and upper left quadrant. Exam: There is some slight erythema to the upper left quadrant. Patient has a lower right lingual exposed bone in the area of numbers #32. This measures 3 x 8 mm. This appears to represent a bony sequestrum. Procedure: Pressure indicating pain applied to dentures. Adjustments made as needed. [name]. Occlusion evaluated and adjustments made as needed for centric relation and protrusive strokes. Patient accepts results. We discussed the risks, benefits, and complications of removing the bony sequestrum today. Patient did agree to proceed with removal of the bony sequestrum. Topical anesthetic was applied to the lower right lingual aspect. The bony sequestrum was removed with a rongeur. A 3 x 8 mm piece of bone was removed. There was soft tissue underneath the bony sequestrum that was almost completely healed. Area was irrigated well with sterile saline. Plan will be to use chlorhexidine rinses twice daily as prescribed. Patient  to keep lower denture out for approximately one week . Patient to then start using lower denture again. Use salt water rinses as needed to aid healing. RTC as scheduled for denture adjustment. Call if problems arise before then. Pt. dismissed in stable condition. Dr. Cindra Eves

## 2011-10-27 ENCOUNTER — Ambulatory Visit: Payer: Medicaid Other | Admitting: Nutrition

## 2011-10-27 ENCOUNTER — Ambulatory Visit (HOSPITAL_BASED_OUTPATIENT_CLINIC_OR_DEPARTMENT_OTHER): Payer: Medicaid Other | Admitting: Oncology

## 2011-10-27 VITALS — BP 140/82 | HR 51 | Temp 98.1°F | Ht 66.0 in | Wt 118.0 lb

## 2011-10-27 DIAGNOSIS — Z85819 Personal history of malignant neoplasm of unspecified site of lip, oral cavity, and pharynx: Secondary | ICD-10-CM

## 2011-10-27 DIAGNOSIS — R609 Edema, unspecified: Secondary | ICD-10-CM

## 2011-10-27 DIAGNOSIS — Z931 Gastrostomy status: Secondary | ICD-10-CM

## 2011-10-27 DIAGNOSIS — C099 Malignant neoplasm of tonsil, unspecified: Secondary | ICD-10-CM

## 2011-10-27 DIAGNOSIS — E46 Unspecified protein-calorie malnutrition: Secondary | ICD-10-CM

## 2011-10-27 NOTE — Progress Notes (Signed)
[facility] OFFICE PROGRESS NOTE  Cc:  No primary provider on file.  DIAGNOSIS:    History of left tonsil squamous cell carcinoma; cT2 N2c M0.  PAST THERAPY:  definitive concurrent chemoradiation with daily XRT and weekly cisplatin 40mg /m2 on between 02/01/2011 and 03/22/2011.  He terminately chemotherapy prematurely with last dose given on 02/22/2011 due to prolonged cytopenia.  CURRENT THERAPY:  watchful observation.    INTERVAL HISTORY Russell Williamson 56 y.o. male returns for regular follow up by himself.  - Pancreatitis last month:  Now; abdominal pain, nausea/vomiting have completely resolved.  He follows up with pain clinic for chronic pain management. - With respect to history of oropharynx cancer, he still smokes cigarettes about 1/4 pack per day.  However, he is exposed to extensive second hand smoke from his sister and friends.  He just recently got denture; however, the bottom one does not fit well.  He still depends for more than 90% of PEG tube feeding for nutritional requirement.  He just recently started eating soft foods again.  He has mild-moderate xerostomia.  He has thick edema of the submental area.  He has not been to Lymphedema clinic.  He denies any palpable node.    Patient denies fatigue, headache, visual changes, confusion, drenching night sweats,jaundice, chest pain, palpitation, shortness of breath, dyspnea on exertion, productive cough, gum bleeding, epistaxis, hematemesis, hemoptysis, abdominal pain, abdominal swelling, early satiety, melena, hematochezia, hematuria, skin rash, spontaneous bleeding, joint swelling, joint pain, heat or cold intolerance, bowel bladder incontinence, back pain, focal motor weakness, paresthesia, depression, suicidal or homocidal ideation, feeling hopelessness.   MEDICAL HISTORY: Past Medical History  Diagnosis Date  . Tonsillar cancer     tonsillar ca dx 12/27/10  . Hypertension   . Chronic pancreatitis     SURGICAL  HISTORY: No past surgical history on file.  MEDICATIONS: Current Outpatient Prescriptions  Medication Sig Dispense Refill  . Alum & Mag Hydroxide-Simeth (MAGIC MOUTHWASH W/LIDOCAINE) SOLN Take 5 mLs by mouth 4 (four) times daily as needed (Swish and Spit).  500 mL  0  . chlorhexidine (PERIDEX) 0.12 % solution Rinse with 15 mls twice daily for 30 seconds. Use after breakfast and at bedtime. Spit out excess. Do not swallow.  480 mL  2  . Cholecalciferol (VITAMIN D) 1000 UNITS capsule Take 1,000 Units by mouth daily.      . fentaNYL (DURAGESIC - DOSED MCG/HR) 50 MCG/HR Place 1 patch onto the skin every 3 (three) days.      . fish oil-omega-3 fatty acids 1000 MG capsule Take 1 g by mouth daily.      . Jevity 1.5 Cal (JEVITY 1.5) LIQD Place 1,000 mLs into feeding tube daily.      . lipase/protease/amylase (CREON) 12000 UNITS CPEP Take 1 capsule by mouth 4 (four) times daily.      Marland Kitchen LORazepam (ATIVAN) 0.5 MG tablet Take 0.5 mg by mouth every 6 (six) hours as needed.        . metoprolol (LOPRESSOR) 50 MG tablet Take 50 mg by mouth 2 (two) times daily.      Marland Kitchen omeprazole (PRILOSEC) 40 MG capsule Take 40 mg by mouth daily.       Marland Kitchen oxyCODONE (ROXICODONE) 15 MG immediate release tablet Take 15 mg by mouth every 4 (four) hours as needed. pain      . promethazine (PHENERGAN) 25 MG tablet Take 25 mg by mouth every 6 (six) hours as needed.        Marland Kitchen  vitamin E (VITAMIN E) 400 UNIT capsule Take 400 Units by mouth 2 (two) times daily.        Marland Kitchen zolpidem (AMBIEN) 10 MG tablet Take 10 mg by mouth at bedtime as needed. sleep      . senna (SENOKOT) 8.6 MG tablet Take 2 tablets by mouth daily.          ALLERGIES:   has no known allergies.  REVIEW OF SYSTEMS:  The rest of the 14-point review of system was negative.   Filed Vitals:   10/27/11 1033  BP: 140/82  Pulse: 51  Temp: 98.1 F (36.7 C)   Wt Readings from Last 3 Encounters:  10/27/11 118 lb (53.524 kg)  10/20/11 114 lb 3.2 oz (51.801 kg)  09/17/11 121  lb 4.8 oz (55.021 kg)   ECOG Performance status: 1  PHYSICAL EXAMINATION:   General:  well-nourished in no acute distress.  Eyes:  no scleral icterus.  ENT:  There were no oropharyngeal lesions.  Neck was without thyromegaly.  There was submental edema.  Lymphatics:  Negative cervical, supraclavicular or axillary adenopathy.  Respiratory: lungs were clear bilaterally without wheezing or crackles.  Cardiovascular:  Regular rate and rhythm, S1/S2, without murmur, rub or gallop.  There was no pedal edema.  GI:  abdomen was soft, flat, nontender, nondistended, without organomegaly. PEG tube was dry/clean/intact. Muscoloskeletal:  no spinal tenderness of palpation of vertebral spine.  Skin exam was without echymosis, petichae.  Neuro exam was nonfocal.  Patient was able to get on and off exam table without assistance.  Gait was normal.  Patient was alerted and oriented.  Attention was good.   Language was appropriate.  Mood was normal without depression.  Speech was not pressured.  Thought content was not tangential.      LABORATORY DATA: WBC 2.3; Hgb 10.4; Plt 132.  Cr .54.   IMAGING:  I personally reviewed CT neck 09/14/2011 which was negative for residual cancer.   However, there waschronic bilateral innominate vein thrombosis. A prominent left supreme intercostal vein collateral is partially re- identified. The SVC also appears chronically thrombosed. Densely  enhancing right paravertebral and chest wall collaterals also noted.    ASSESSMENT AND PLAN:    1. History of oropharynx cancer:  I discussed with Mr. Kardell that on the CT scan, there is no evidence of residual disease or metastatic disease.  I advised him to follow with [place] q6 months between visits with ENT and Rad Onc.  2. Calorie protein malnutrition.  He recently got dentures and is starting to eat soft foods.  I referred him back to Nutrition to help him transition to more solid foods with goal to be PEG independent by summer of  this year.  3. Recent history of pancreatitis.  He is completely off of alcohol at this time.  Unclear etiology of this most recent pancreatitis.  He follows up with his PCP.  4. Submental edema:  Due to treatment for his head/neck cancer.  I referred him to Lymphedema clinic for exercise to decrease this edema.  5. Smoking:  He has tried nicotine, Chantix, and smokeless cigarettes without success at quitting.  I advised him that he is at very high risk of recurrence with continuing to smoke.  I will try cold [place] soon.  I advised him to move out of his sister house and remove himself from people who smoke to limit the peer pressure.  6. 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  He does not have SOB, wheezing typical of acute SVC syndrome.  I discussed this case with Dr. Mitzi Hansen recently, but do not personally see a role for anticoagulation given its chronicity.   7. Mild cytopenia:  Most likely due to recent chemo and recent pancreatitis.  I have low clinical suspicion for primary bone marrow process at this time.  I'll continue to monitor his CBC.    The length of time of the face-to-face encounter was 25  minutes. More than 50% of time was spent counseling and coordination of care.

## 2011-10-27 NOTE — Progress Notes (Signed)
INTERIM HISTORY:  [name] presents to nutrition followup.  His weight has decreased to 118 pounds from 122.4 pounds last September.  The patient reports that recent weight loss is reflective of his recent hospitalization.  He reports he continues to use Jevity 1.5 four cans overnight via his feeding tube.  He is eating very soft foods during the day and continues to drink 2 cans of Jevity 1.5 or Ensure Plus.  He reports that food still does not taste good to him.  He does have some food limitations in terms of what he agrees to eat or not agrees to eat. He understands that the plan is to wean off his nocturnal tube feeding and increase his oral intake throughout the day while minimizing any further weight loss.  NUTRITION DIAGNOSIS:  Inadequate oral intake continues.  INTERVENTION:  I have worked through a plan to increase the patient's oral intake throughout the day so that he can discontinue his nocturnal feedings at night.  With the patient's assistance, I have written out foods for him to eat at breakfast, lunch, and dinner.  In addition to this, he will consume either 1 can of Jevity 1.5 or 1 can of Ensure Plus 6 times during the day.  The patient has agreed to work to increase his oral intake.  He reports understanding that he will be increasing cans of  Jevity 1.5 or Ensure Plus by mouth and decreasing his nocturnal feeding in order to get his feeding tube removed.  I have provided this information for the patient to take home with him today.  MONITORING, EVALUATION, AND GOALS:  The patient has been unable to maintain his weight using a combination of tube feedings and oral intake.  NEXT VISIT:  Wednesday, 11/10/2011.    ______________________________ Zenovia Jarred, RD, LDN Clinical Nutrition Specialist BN/MEDQ  D:  10/27/2011  T:  10/27/2011  Job:  [id number]

## 2011-10-28 ENCOUNTER — Ambulatory Visit: Payer: Self-pay

## 2011-10-28 ENCOUNTER — Encounter: Payer: Self-pay | Admitting: *Deleted

## 2011-10-28 NOTE — Progress Notes (Signed)
Faxed Referral to Lymphedema clinic per Dr. Gaylyn Rong for Submental edema to [facility] at fax [phone].

## 2011-11-10 ENCOUNTER — Ambulatory Visit: Payer: Medicaid - Dental | Admitting: Nutrition

## 2011-11-10 ENCOUNTER — Ambulatory Visit (HOSPITAL_COMMUNITY): Payer: Medicaid - Dental | Admitting: Dentistry

## 2011-11-10 VITALS — BP 102/65 | HR 79 | Temp 98.0°F

## 2011-11-10 DIAGNOSIS — K1370 Unspecified lesions of oral mucosa: Secondary | ICD-10-CM

## 2011-11-10 DIAGNOSIS — K0889 Other specified disorders of teeth and supporting structures: Secondary | ICD-10-CM

## 2011-11-10 DIAGNOSIS — Z463 Encounter for fitting and adjustment of dental prosthetic device: Secondary | ICD-10-CM

## 2011-11-10 DIAGNOSIS — K062 Gingival and edentulous alveolar ridge lesions associated with trauma: Secondary | ICD-10-CM

## 2011-11-10 DIAGNOSIS — K117 Disturbances of salivary secretion: Secondary | ICD-10-CM

## 2011-11-10 DIAGNOSIS — M272 Inflammatory conditions of jaws: Secondary | ICD-10-CM

## 2011-11-10 NOTE — Progress Notes (Signed)
Wednesday, November 10, 2011   BP: 102/65            P: 79          T: 98.0  Russell Williamson presents for evaluation of upper and lower complete dentures as well as lower right lingual exposed bone. S: Patient is complaining of denture irritation to the lower right  and lower left buccal extensions. Exam: Patient with generalized erythema to oral tissues consistent with continued smoking. Patient has a small lower right lingual exposed bone in the area of numbers #32. This measures 0.5 X 0.5 mm. This appears to represent a residual bony sequestrum. This was easily removed with a small tissue forceps.  Upper and lower dentures are stable and retentive. Procedure: Pressure indicating pain applied to dentures. Adjustments made as needed. [name]. Denture borders adjusted minimally. [name]. Occlusion evaluated and adjustments made as needed for centric relation. Patient has excessive tongue movements that make retention of lower denture difficult for the patient.. Patient accepts results. Patient to use chlorhexidine rinses twice daily as previously  prescribed. Patient  to keep lower denture out as needed. Use salt water rinses as needed to aid healing. RTC as scheduled for denture adjustment. Call if problems arise before then. Pt. dismissed in stable condition. Dr. Cindra Eves

## 2011-11-10 NOTE — Progress Notes (Signed)
Russell Williamson returns for nutrition followup.  His weight has increased to 120.4 pounds from 118 pounds.  He reports he just got new dentures today and he is looking forward to trying them out.  He has not been able to increase his oral intake.  Despite multiple education sessions with Russell Williamson,  he has not increased his oral intake, even of liquids which he tolerates just fine.  He continues to use Jevity 1.5 five cans in his feeding tube overnight and he continues to try to drink 2 or 3 cans of Ensure Plus or Jevity 1.5 throughout the day.  He has not increased his oral intake as I had educated him to do at the last visit. He reports he is going to try some scrambled eggs and bacon for breakfast.  I have again stressed the importance of him increasing oral intake so that tube feedings can be decreased.  NUTRITION DIAGNOSIS:  Inadequate oral intake continues.  INTERVENTION:  I have educated Russell Williamson once again to decrease nocturnal feeding and increase oral intake throughout the day.  I have provided written instructions for him in the past.  I have reinforced these again today.  I have provided him with an additional case of Ensure Plus for him today.  The patient verbalizes understanding and willingness to comply.  MONITORING/EVALUATION/GOALS:  The patient continues to use tube feedings instead of increasing his oral intake.  He will work to eliminate tube feeding and increase oral intake.  NEXT VISIT:  March 15th after radiation followup.    ______________________________ Russell Williamson, RD, LDN Clinical Nutrition Specialist BN/MEDQ  D:  11/10/2011  T:  11/10/2011  Job:  [id number]

## 2011-11-15 ENCOUNTER — Ambulatory Visit: Payer: Self-pay | Admitting: Physical Therapy

## 2011-11-22 DIAGNOSIS — K0889 Other specified disorders of teeth and supporting structures: Secondary | ICD-10-CM

## 2011-11-22 DIAGNOSIS — K117 Disturbances of salivary secretion: Secondary | ICD-10-CM

## 2011-11-23 ENCOUNTER — Ambulatory Visit (HOSPITAL_COMMUNITY): Payer: Medicaid - Dental | Admitting: Dentistry

## 2011-11-23 VITALS — BP 108/70 | HR 80 | Temp 98.9°F

## 2011-11-23 DIAGNOSIS — M272 Inflammatory conditions of jaws: Secondary | ICD-10-CM

## 2011-11-23 NOTE — Progress Notes (Signed)
Tuesday, November 23, 2011   BP:108/70        P:80        T: 98.9  Russell Williamson presents for evaluation of upper and lower complete dentures as well as lower right lingual. S: Patient is complaining of lower right lingual as before. Exam: Patient with generalized erythema to oral tissues consistent with continued smoking. Patient again encouraged to STOP smoking. Patient has a 0.65mm round area of exposed bone in the area of numbers #32. This appears to represent a residual bony sequestrum.  This was easily removed with a small tissue forceps.  Upper and lower dentures are stable and retentive. Procedure: Pressure indicating paste was applied to the dentures. Adjustments made as needed. [name]. Occlusion evaluated and adjustments made as needed for centric relation. Patient with tendency to protrude to non-centric position.  Patient instructed on how to find centric relation occlusion. Patient accepts results. Patient to continue use chlorhexidine rinses twice daily as prescribed. Patient  to keep lower denture out as needed. Use salt water rinses as needed to aid healing. RTC as scheduled for denture adjustment. Call if problems arise before then. Patient dismissed in stable condition.  Dr. Cindra Eves

## 2011-11-26 NOTE — Telephone Encounter (Signed)
XXXX 

## 2011-12-08 ENCOUNTER — Ambulatory Visit (HOSPITAL_COMMUNITY): Payer: Medicaid - Dental | Admitting: Dentistry

## 2011-12-08 VITALS — BP 123/71 | HR 71 | Temp 97.2°F

## 2011-12-08 DIAGNOSIS — K08109 Complete loss of teeth, unspecified cause, unspecified class: Secondary | ICD-10-CM

## 2011-12-08 DIAGNOSIS — K117 Disturbances of salivary secretion: Secondary | ICD-10-CM

## 2011-12-08 DIAGNOSIS — K062 Gingival and edentulous alveolar ridge lesions associated with trauma: Secondary | ICD-10-CM

## 2011-12-08 DIAGNOSIS — Z463 Encounter for fitting and adjustment of dental prosthetic device: Secondary | ICD-10-CM

## 2011-12-08 NOTE — Progress Notes (Signed)
Wednesday, December 08, 2011   BP: 123/71           P: 71           T: 97.2  Russell Williamson presents for evaluation of upper and lower complete dentures as well as lower right lingual healing of previous exposed bone. S: Patient is complaining of upper left denture irritation. "Lower is ok". Exam: Patient with generalized erythema to oral tissues consistent with continued smoking and xerostomia. Patient has had complete resolution of the previously exposed bone in the area of numbers #32.  No denture ulcerations are noted.  Upper and lower dentures are stable and retentive.  Procedure: Pressure indicating paste was applied to the dentures. Adjustments made as needed. [name]. Occlusion evaluated and adjustments made as needed for centric relation. Patient with tendency to protrude to non-centric position.  Patient again instructed on how to find centric relation occlusion. Patient accepts results. Patient to continue use chlorhexidine rinses twice daily as prescribed.  Use salt water rinses as needed. RTC as scheduled for denture adjustment. Call if problems arise before then. Patient dismissed in stable condition.  Dr. Cindra Eves

## 2011-12-17 ENCOUNTER — Encounter: Payer: Self-pay | Admitting: Nutrition

## 2011-12-17 ENCOUNTER — Ambulatory Visit: Payer: Medicaid Other | Admitting: Radiation Oncology

## 2011-12-21 ENCOUNTER — Encounter: Payer: Self-pay | Admitting: *Deleted

## 2011-12-21 DIAGNOSIS — Z923 Personal history of irradiation: Secondary | ICD-10-CM | POA: Insufficient documentation

## 2011-12-21 DIAGNOSIS — K449 Diaphragmatic hernia without obstruction or gangrene: Secondary | ICD-10-CM | POA: Insufficient documentation

## 2011-12-22 ENCOUNTER — Encounter (HOSPITAL_COMMUNITY): Payer: Self-pay | Admitting: Dentistry

## 2011-12-22 ENCOUNTER — Ambulatory Visit (HOSPITAL_COMMUNITY): Payer: Medicaid - Dental | Admitting: Dentistry

## 2011-12-22 VITALS — BP 103/63 | HR 77 | Temp 97.7°F

## 2011-12-22 DIAGNOSIS — K062 Gingival and edentulous alveolar ridge lesions associated with trauma: Secondary | ICD-10-CM

## 2011-12-22 DIAGNOSIS — K1370 Unspecified lesions of oral mucosa: Secondary | ICD-10-CM

## 2011-12-22 DIAGNOSIS — K117 Disturbances of salivary secretion: Secondary | ICD-10-CM

## 2011-12-22 DIAGNOSIS — Z463 Encounter for fitting and adjustment of dental prosthetic device: Secondary | ICD-10-CM

## 2011-12-22 NOTE — Progress Notes (Signed)
Wednesday, December 22, 2011   BP: 103/63            P:  77             T: 97.7  Russell Williamson presents for evaluation of upper and lower complete dentures. S: Patient is complaining of upper left denture irritation. "Lower is not causing any problems". Exam: Patient with generalized erythema to oral tissues consistent with continued smoking and xerostomia. Patient still has had complete resolution of the previously exposed bone in the area of numbers #32.  No denture ulcerations are noted.  Upper and lower dentures are stable and retentive.  Procedure: Pressure indicating paste was applied to the dentures. Adjustments made as needed. [name]. Occlusion evaluated and adjustments made as needed for centric relation. Patient still with tendency to protrude to non-centric positions.  Patient again instructed on how to find centric relation occlusion. Patient accepts results. Patient to continue use chlorhexidine rinses twice daily as prescribed.  Use salt water rinses as needed. RTC as scheduled for denture adjustment. Call if problems arise before then. Patient dismissed in stable condition.   Dr. Cindra Eves

## 2011-12-24 ENCOUNTER — Ambulatory Visit
Admission: RE | Admit: 2011-12-24 | Discharge: 2011-12-24 | Disposition: A | Discharge status: Home / Self Care | Payer: Medicaid Other | Source: Ambulatory Visit | Attending: Radiation Oncology | Admitting: Radiation Oncology

## 2011-12-24 ENCOUNTER — Encounter: Payer: Self-pay | Admitting: Radiation Oncology

## 2011-12-24 VITALS — BP 117/74 | HR 71 | Temp 98.1°F | Resp 18 | Wt 124.0 lb

## 2011-12-24 DIAGNOSIS — Z79899 Other long term (current) drug therapy: Secondary | ICD-10-CM | POA: Insufficient documentation

## 2011-12-24 DIAGNOSIS — Z931 Gastrostomy status: Secondary | ICD-10-CM | POA: Insufficient documentation

## 2011-12-24 DIAGNOSIS — C099 Malignant neoplasm of tonsil, unspecified: Secondary | ICD-10-CM | POA: Insufficient documentation

## 2011-12-24 MED ORDER — LARYNGOSCOPY SOLUTION RAD-ONC
15.0000 mL | Freq: Once | TOPICAL | Status: AC
  Administered 2011-12-24: 15 mL via TOPICAL
  Filled 2011-12-24: qty 15

## 2011-12-24 NOTE — Progress Notes (Signed)
Encounter addended by: Lowella Petties, RN on: 12/24/2011  2:04 PM<BR>     Documentation filed: Inpatient MAR

## 2011-12-24 NOTE — Progress Notes (Signed)
Encounter addended by: Lowella Petties, RN on: 12/24/2011  2:05 PM<BR>     Documentation filed: Notes Section

## 2011-12-24 NOTE — Progress Notes (Signed)
Radiation Oncology         [phone] ________________________________  Name: Russell Williamson MRN: [id number]  Date: 12/24/2011  DOB: May 23, 1956  Follow-Up Visit Note  CC: No primary provider on file.  Dillard Cannon M.D., Jethro Bolus M.D.  Diagnosis:   Squamous cell carcinoma of the left tonsil, T2 N2 AN0  Narrative:  The patient returns today for routine follow-up.  He states that he has been feeling well. He complains of some xerostomia and iodine is helping with this. He has been eating more. He does use his feeding tube at night but he has not been using it during the day. Rather he has been taking and all of his nutrition during the day by mouth. He complains of some occasional left-sided tongue pain and a pain level of 2 on a scale of 0-10. The swelling/edema in the submental area has markedly improved since he was last seen.                              ALLERGIES:   has no known allergies.  Meds: Current Outpatient Prescriptions  Medication Sig Dispense Refill  . Alum & Mag Hydroxide-Simeth (MAGIC MOUTHWASH W/LIDOCAINE) SOLN Take 5 mLs by mouth 4 (four) times daily as needed (Swish and Spit).  500 mL  0  . Cholecalciferol (VITAMIN D) 1000 UNITS capsule Take 1,000 Units by mouth daily.      . fentaNYL (DURAGESIC - DOSED MCG/HR) 50 MCG/HR Place 1 patch onto the skin every 3 (three) days.      . fish oil-omega-3 fatty acids 1000 MG capsule Take 1 g by mouth daily.      . Jevity 1.5 Cal (JEVITY 1.5) LIQD Place 1,000 mLs into feeding tube daily.      . lipase/protease/amylase (CREON) 12000 UNITS CPEP Take 1 capsule by mouth 4 (four) times daily.      Marland Kitchen LORazepam (ATIVAN) 0.5 MG tablet Take 0.5 mg by mouth every 6 (six) hours as needed.        . metoprolol (LOPRESSOR) 50 MG tablet Take 50 mg by mouth 2 (two) times daily.      . nicotine (NICODERM CQ - DOSED IN MG/24 HR) 7 mg/24hr patch Place 1 patch onto the skin daily.      Marland Kitchen omeprazole (PRILOSEC) 40 MG capsule Take 40 mg by mouth  daily.       Marland Kitchen oxyCODONE (ROXICODONE) 15 MG immediate release tablet Take 15 mg by mouth every 4 (four) hours as needed. pain      . senna (SENOKOT) 8.6 MG tablet Take 2 tablets by mouth daily.        . vitamin E (VITAMIN E) 400 UNIT capsule Take 400 Units by mouth 2 (two) times daily.        Marland Kitchen zolpidem (AMBIEN) 10 MG tablet Take 10 mg by mouth at bedtime as needed. sleep      . promethazine (PHENERGAN) 25 MG tablet Take 25 mg by mouth every 6 (six) hours as needed.          Physical Findings: The patient is in no acute distress. Patient is alert and oriented.  weight is 124 lb (56.246 kg). His oral temperature is 98.1 F (36.7 C). His blood pressure is 117/74 and his pulse is 71. His respiration is 18. Marland Kitchen   HEENT: Normocephalic atraumatic Oral cavity clear without any mucositis or lesions. Neck: No lymphadenopathy. Some fibrosis and a small  amount of edema present in the submental area. This is much improved. Cardiovascular: Regular rate and rhythm Respiratory: Clear to auscultation bilaterally GI: Soft nontender feeding tube in place Extremities: No edema present Fiberoptic exam: After the use of topical anesthetic a flexible scope was passed through the left nadir. Good visualization was obtained and no lesions or suspicious findings were present within the larynx oropharynx or hypopharynx. There was some pooling of some secretions within the larynx which obscures the view slightly. Very good visualization was obtained in the base of tongue region and pictures were taken.  Lab Findings: Lab Results  Component Value Date   WBC 2.3* 10/18/2011   HGB 10.4* 10/18/2011   HCT 31.4* 10/18/2011   MCV 96.9 10/18/2011   PLT 132* 10/18/2011     Radiographic Findings: No results found.  Impression:  The patient clinically is doing well today. He has no worsening symptoms and no suspicious findings on exam.  Plan:  The patient will return to clinic in 6 months for continued followup. The patient  is making strides in terms of increasing his by mouth intake. I encouraged him to continue to do this and I believe that he is on a path which will allow him to hopefully have his feeding tube removed in several months potentially.  I spent 15 minutes with the patient today, the majority of which was spent counseling him on his diagnosis and coordinating his care.   Radene Gunning, M.D., Ph.D.

## 2011-12-24 NOTE — Progress Notes (Signed)
Patient presents to the clinic today unaccompanied for a follow up appointment with Dr. Mitzi Hansen. Patient is alert and oriented to person, place, and time. No distress noted. Steady gait noted. Pleasant affect noted. Patient reports pain on the left side of his tongue 2 on a scale of 0-10 for which fentanyl and oxycodone help relieve. Patient reports that he had dentures now but left them at home. Patient reports he is eating by mouth more and wants to get his PEG tube out. Back Oct 2012 patient weighed 121.9 but today he is 124. Patient reports persistent dry mouth for which he uses Biotene. Patient reports that he has not seen Dr. Ezzard Standing in some time but, thinks he last saw him two months ago. Patient reports that nausea has improved. LLQ peg tube without redness, pain, or edema. Reported all findings to Dr. Mitzi Hansen.

## 2011-12-24 NOTE — Progress Notes (Signed)
Set up laryngoscope for MD,pt used laryngoscopy solution spray in  both nostrils, states"brethes better left nostril" informed Md, pt given 6 month f/u after procedure,pt tolerated well 2:05 PM

## 2012-01-25 ENCOUNTER — Encounter (HOSPITAL_COMMUNITY): Payer: Self-pay | Admitting: Dentistry

## 2012-01-25 ENCOUNTER — Ambulatory Visit (HOSPITAL_COMMUNITY): Payer: Medicaid - Dental | Admitting: Dentistry

## 2012-01-25 VITALS — BP 105/63 | HR 63 | Temp 98.1°F

## 2012-01-25 DIAGNOSIS — K062 Gingival and edentulous alveolar ridge lesions associated with trauma: Secondary | ICD-10-CM

## 2012-01-25 DIAGNOSIS — K1370 Unspecified lesions of oral mucosa: Secondary | ICD-10-CM

## 2012-01-25 DIAGNOSIS — K117 Disturbances of salivary secretion: Secondary | ICD-10-CM

## 2012-01-25 DIAGNOSIS — M272 Inflammatory conditions of jaws: Secondary | ICD-10-CM

## 2012-01-25 DIAGNOSIS — M278 Other specified diseases of jaws: Secondary | ICD-10-CM

## 2012-01-25 MED ORDER — CHLORHEXIDINE GLUCONATE 0.12 % MT SOLN: OROMUCOSAL | Status: DC

## 2012-01-25 MED ORDER — CHLORHEXIDINE GLUCONATE 0.12 % MT SOLN: OROMUCOSAL | Status: AC

## 2012-01-25 NOTE — Progress Notes (Signed)
01/25/2012   BP 105/63  Pulse 63  Temp(Src) 98.1 F (36.7 C) (Oral)  Lab Results  Component Value Date   WBC 2.3* 10/18/2011   HGB 10.4* 10/18/2011   HCT 31.4* 10/18/2011   MCV 96.9 10/18/2011   PLT 132* 10/18/2011    [name] presents for evaluation of upper and lower complete dentures. S: Patient is complaining of upper left denture irritation.  Exam: Patient with generalized erythema to oral tissues consistent with continued smoking and xerostomia. Patient now has a 6 mm x 8 mm area of exposed maxilla in the area of tooth #15 alveolar ridge. No other denture ulcerations are noted. Upper and lower dentures are stable and retentive.   Procedure: Pressure indicating paste was applied to the dentures. Adjustments made as needed. [name]. Occlusion evaluated and adjustments made as needed for centric relation. Patient still with tendency to protrude to non-centric positions.  Patient again instructed on how to find centric relation occlusion. Patient accepts results. We discussed partial ostectomy to assist in healing of the exposed bone. Patient agrees to the procedure today in accepts risks, benefits, and complications of the procedure. Procedure: Double anesthetic to the area. Partial ostectomy performed with good heme from bone noted. This removal of bone should assist in allowing tissues to cover the area of the exposed bone. Patient instructed to keep dentures out for one week. Patient also use chlorhexidine rinses 3 times daily as instructed. A prescription was provided to the patient. Patient also instructed to stop smoking. Patient also to use salt water rinses as needed. Return to clinic as scheduled for denture adjustment and evaluation of exposed bone. Patient is to call if problems worsen. Recent dismissed in stable condition. Patient tolerated the procedure well.   Dr. Cindra Eves

## 2012-01-26 ENCOUNTER — Telehealth: Payer: Self-pay | Admitting: *Deleted

## 2012-01-26 NOTE — Telephone Encounter (Signed)
Pt called to report he is having difficulty getting his dose of oxycodone filled at his primary pharmacy due to they do not have the correct dose. He wants to get it filled at another pharmacy down the street but states the pain clinic will not do this and are making him wait for his pharmacy to get the correct dose in.   Informed pt Dr. Gaylyn Rong cannot prescribe him any pain medication,  It can only come from his pain clinic and pt will need to continue to contact them for his meds.  Pt verbalized understanding.

## 2012-01-27 ENCOUNTER — Other Ambulatory Visit: Payer: Self-pay | Admitting: Oncology

## 2012-02-03 ENCOUNTER — Ambulatory Visit (HOSPITAL_COMMUNITY): Payer: Medicaid - Dental | Admitting: Dentistry

## 2012-02-03 VITALS — BP 108/71 | HR 74 | Temp 97.3°F

## 2012-02-03 DIAGNOSIS — M278 Other specified diseases of jaws: Secondary | ICD-10-CM

## 2012-02-03 DIAGNOSIS — M272 Inflammatory conditions of jaws: Secondary | ICD-10-CM

## 2012-02-03 DIAGNOSIS — K117 Disturbances of salivary secretion: Secondary | ICD-10-CM

## 2012-02-03 NOTE — Progress Notes (Signed)
02/03/2012   BP 108/71  Pulse 74  Temp(Src) 97.3 F (36.3 C) (Oral)  Russell Williamson presents for evaluation of exposed bone in the maxillary left quadrant area 15-16.  S: Patient to keep the exposed bone is still present . Patient has not been wearing dentures by report. Patient is still smoking by report. Patient encouraged to stop smoking.   Exam: Patient with generalized erythema to oral tissues consistent with continued smoking and xerostomia. Patient has persistent area of exposed bone that measures 6 mm x 8 mm in the area of tooth #15 alveolar ridge. No other denture ulcerations are noted.  Procedure: We discussed partial ostectomy to assist in healing of the exposed bone. Patient agrees to the procedure today in accepts risks, benefits, and complications of the procedure. Procedure: Topical anesthetic to the area. Partial ostectomy performed with rongeurs and bone file. Good heme from bone noted. Area was irrigated with copious sterile saline followed by chlorhexidine rinse post operatively. This removal of bone should assist in allowing tissues to cover the area of the exposed bone. Patient instructed to keep dentures out for one to two additional weeks. Patient also use chlorhexidine rinses 3 times daily as instructed.  Patient also instructed to stop smoking. Patient also to use salt water rinses as needed. Return to clinic as scheduled for re-evaluation of this area.Patient is to call if problems worsen. Will consider referral to wound center for hyperbaric oxygen therapy if delayed healing continues. Pateint dismissed in stable condition. Patient tolerated the procedure well.   Dr. Cindra Eves

## 2012-02-09 ENCOUNTER — Other Ambulatory Visit: Payer: Self-pay | Admitting: Oncology

## 2012-02-09 DIAGNOSIS — C801 Malignant (primary) neoplasm, unspecified: Secondary | ICD-10-CM

## 2012-02-09 DIAGNOSIS — Z923 Personal history of irradiation: Secondary | ICD-10-CM

## 2012-02-09 DIAGNOSIS — C099 Malignant neoplasm of tonsil, unspecified: Secondary | ICD-10-CM

## 2012-02-17 ENCOUNTER — Encounter (HOSPITAL_COMMUNITY): Payer: Self-pay | Admitting: Dentistry

## 2012-02-17 ENCOUNTER — Ambulatory Visit (HOSPITAL_COMMUNITY): Payer: Medicaid - Dental | Admitting: Dentistry

## 2012-02-17 DIAGNOSIS — K117 Disturbances of salivary secretion: Secondary | ICD-10-CM

## 2012-02-17 DIAGNOSIS — M278 Other specified diseases of jaws: Secondary | ICD-10-CM

## 2012-02-17 DIAGNOSIS — K08109 Complete loss of teeth, unspecified cause, unspecified class: Secondary | ICD-10-CM

## 2012-02-17 NOTE — Progress Notes (Signed)
02/17/2012   Patient:            Russell Williamson Date of Birth:  [birth year] MRN:                [id number]  BP 101/66  Pulse 71  Temp 97.4 F (36.3 C)  Past Medical History  Diagnosis Date  . Tonsillar cancer     tonsillar ca dx 12/27/10  . Hypertension   . Chronic pancreatitis   . Arthritis   . Hiatal hernia   . Seizures     alcohol-related  . Hx of radiation therapy 02/02/11 to 03/22/11    L tonsil  . Cancer 12/15/10    left tonsillar squamous cell carcinoma  No Known Allergies  Current Outpatient Prescriptions  Medication Sig Dispense Refill  . Alum & Mag Hydroxide-Simeth (MAGIC MOUTHWASH W/LIDOCAINE) SOLN Take 5 mLs by mouth 4 (four) times daily as needed (Swish and Spit).  500 mL  0  . buPROPion (WELLBUTRIN XL) 150 MG 24 hr tablet TAKE ONE TABLET BY MOUTH DAILY  90 tablet  1  . Cholecalciferol (VITAMIN D) 1000 UNITS capsule Take 1,000 Units by mouth daily.      . fentaNYL (DURAGESIC - DOSED MCG/HR) 50 MCG/HR Place 1 patch onto the skin every 3 (three) days.      . fish oil-omega-3 fatty acids 1000 MG capsule Take 1 g by mouth daily.      . Jevity 1.5 Cal (JEVITY 1.5) LIQD Place 1,000 mLs into feeding tube daily.      Marland Kitchen lidocaine (XYLOCAINE) 2 % solution TAKE 5 ML BY MOUTH WITH 5 ML OF ROBITUSSIN. GARLE AND SPIT EVERY 6 HOURS  500 mL  0  . lipase/protease/amylase (CREON) 12000 UNITS CPEP Take 1 capsule by mouth 4 (four) times daily.      Marland Kitchen LORazepam (ATIVAN) 0.5 MG tablet Take 0.5 mg by mouth every 6 (six) hours as needed.        . metoprolol (LOPRESSOR) 50 MG tablet Take 50 mg by mouth 2 (two) times daily.      . nicotine (NICODERM CQ - DOSED IN MG/24 HR) 7 mg/24hr patch Place 1 patch onto the skin daily.      Marland Kitchen omeprazole (PRILOSEC) 40 MG capsule Take 40 mg by mouth daily.       Marland Kitchen oxyCODONE (ROXICODONE) 15 MG immediate release tablet Take 15 mg by mouth every 4 (four) hours as needed. pain      . promethazine (PHENERGAN) 25 MG tablet Take 25 mg by mouth every 6 (six) hours as  needed.        . senna (SENOKOT) 8.6 MG tablet Take 2 tablets by mouth daily.        . vitamin E (VITAMIN E) 400 UNIT capsule Take 400 Units by mouth 2 (two) times daily.        Marland Kitchen zolpidem (AMBIEN) 10 MG tablet Take 10 mg by mouth at bedtime as needed. sleep       Russell Williamson presents for evaluation of exposed bone in the maxillary left quadrant area 15-16.  S: Patient indicates the exposed bone is still present and actually has an area on the lower right lingual alveolar ridge as well. . Patient has not been wearing dentures by report.  Patient is still smoking "one cigarette a day"by report. Patient encouraged to stop smoking.  Patient still using chlorhexidine rinses 3 times daily.  Exam: Patient with generalized erythema to oral tissues consistent with continued smoking and  xerostomia. Patient has persistent area of exposed bone that measures 6 mm x 8 mm in the area of tooth #15 alveolar ridge. Patient now has a small 1 x 1 mm area on the lingual alveolar bone proximately tooth #31 area.  No other denture ulcerations are noted.  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 2. Edentulous 3.  Severe xerostomia-radiation induced.  Plan: 1. Refer to Wound Center for hyperbaric oxygen therapy. 2. Patient instructed to keep dentures out for one to two additional weeks. 3. Patient also use chlorhexidine rinses 3 times daily as instructed.  4. Patient also instructed to stop smoking. 5. Patient also to use salt water rinses as needed.  Patient dismissed in stable condition.  Dr. Cindra Eves

## 2012-02-18 ENCOUNTER — Encounter (HOSPITAL_COMMUNITY): Payer: Self-pay | Admitting: Dentistry

## 2012-02-21 ENCOUNTER — Encounter (HOSPITAL_BASED_OUTPATIENT_CLINIC_OR_DEPARTMENT_OTHER): Payer: Medicaid Other | Attending: Internal Medicine

## 2012-02-21 DIAGNOSIS — Z79899 Other long term (current) drug therapy: Secondary | ICD-10-CM | POA: Insufficient documentation

## 2012-02-21 DIAGNOSIS — T66XXXA Radiation sickness, unspecified, initial encounter: Secondary | ICD-10-CM | POA: Insufficient documentation

## 2012-02-21 DIAGNOSIS — Y842 Radiological procedure and radiotherapy as the cause of abnormal reaction of the patient, or of later complication, without mention of misadventure at the time of the procedure: Secondary | ICD-10-CM | POA: Insufficient documentation

## 2012-02-21 DIAGNOSIS — M278 Other specified diseases of jaws: Secondary | ICD-10-CM | POA: Insufficient documentation

## 2012-02-21 DIAGNOSIS — I10 Essential (primary) hypertension: Secondary | ICD-10-CM | POA: Insufficient documentation

## 2012-02-21 DIAGNOSIS — C099 Malignant neoplasm of tonsil, unspecified: Secondary | ICD-10-CM | POA: Insufficient documentation

## 2012-03-01 NOTE — Progress Notes (Signed)
[facility]  NAME:  Russell Williamson, Russell Williamson NO.:  [id number]  MEDICAL RECORD NO.:  [id number]      DATE OF BIRTH:  15-Feb-1956  PHYSICIAN:  Ardath Sax, M.D.           VISIT DATE:                                  OFFICE VISIT   The patient is a 56 year old, African American male, who is sent here by his doctor, Dr. Cindra Eves, who operated on him for a tonsillar squamous cell carcinoma, and following his surgery, he received radiation.  He had high dose of 69.96 Gy in 33 fractions at 2.12 Gy per fraction.  He apparently has had a good result with no evidence of any recurrence, but he does have ulcerations on both of his mandibles with bone exposed on both the right and left sides.  He has had great problems eating.  He has an awful lot of pain.  He is on pain medicine and on many types of oral gargling medicines, try to ease his oral discomfort.  He, other than that, is a healthy gentleman.  He has a history of pancreatitis in the past.  He has a history of hypertension. He is on metoprolol 25 mg twice a day for his hypertension.  PHYSICAL EXAMINATION:  VITAL SIGNS:  His temperature is 98.6, pulse 70, respirations 20, blood pressure is 140/84. HEENT:  On examination of his ear, nose, and throat, he has 2 areas on both mandibles about 0.5 cm, where the mucous membranes are not covering and you can see and feel the white bone of the mandible.  This is obviously from the radionecrosis. LUNGS:  Clear. HEART:  Has a regular sinus rhythm. ABDOMEN:  Soft. EXTREMITIES:  Do not have any issues to worry about.  The patient has been referred here by his surgeon and there is no further surgery can do, and he felt like hyperbaric oxygen is our only hope of may be getting tissue to heal over this exposed mandible.  He has had x-rays done, which showed the exposed mandible, but it does not look like he has osteo.  His blood work has all been normal and we  are going to apply for hyperbaric oxygen treatments and we will see him back here in a week.     Ardath Sax, M.D.     PP/MEDQ  D:  03/01/2012  T:  03/01/2012  Job:  [id number]

## 2012-03-01 NOTE — Progress Notes (Signed)
[facility]  NAME:  PRANAY, HILBUN NO.:  [id number]  MEDICAL RECORD NO.:  [id number]      DATE OF BIRTH:  Aug 17, 1956  PHYSICIAN:  Ardath Sax, M.D.           VISIT DATE:                                  OFFICE VISIT   This is a 56 year old, African American male, who was sent to [place] by the oral surgeon at [facility] because this gentleman had a squamous carcinoma removed from his right mandible and he now has radionecrosis as he received high dose of radiation to the area of his left tonsillar area where the diagnosis was squamous cell carcinoma.  He also has a history of hypertension and chronic pancreatitis, and examination of him, he has exposed bone that I can easily see and feel, it looks like 2 areas of exposed bone at about 4 or 5 mm in diameter each.  He is edentulous and I feel that hyperbaric oxygen therapy would really help him epithelialize over this exposed bone.  He was a heavy smoker, but he has now stopped smoking.  He has a history of hypertension and he is on lisinopril and metoprolol.  He is on many vitamin pills besides and he also has some Vicodin for pain.  His blood pressure is 180/71, pulse 74, temperature 97.3.  He is alert, cooperative, and I think he is a typical patient with radiation necrosis, which would benefit greatly from hyperbaric oxygen treatments, so we will apply for those, and perhaps we can get these painful radiation cause ulcers in his mouth along his right mandible to heal.     Ardath Sax, M.D.     PP/MEDQ  D:  03/01/2012  T:  03/01/2012  Job:  [id number]

## 2012-03-08 ENCOUNTER — Encounter (HOSPITAL_BASED_OUTPATIENT_CLINIC_OR_DEPARTMENT_OTHER): Payer: Medicaid Other | Attending: Internal Medicine

## 2012-03-08 DIAGNOSIS — Y842 Radiological procedure and radiotherapy as the cause of abnormal reaction of the patient, or of later complication, without mention of misadventure at the time of the procedure: Secondary | ICD-10-CM | POA: Insufficient documentation

## 2012-03-08 DIAGNOSIS — M278 Other specified diseases of jaws: Secondary | ICD-10-CM | POA: Insufficient documentation

## 2012-03-08 DIAGNOSIS — I10 Essential (primary) hypertension: Secondary | ICD-10-CM | POA: Insufficient documentation

## 2012-03-08 DIAGNOSIS — C099 Malignant neoplasm of tonsil, unspecified: Secondary | ICD-10-CM | POA: Insufficient documentation

## 2012-03-08 DIAGNOSIS — T66XXXA Radiation sickness, unspecified, initial encounter: Secondary | ICD-10-CM | POA: Insufficient documentation

## 2012-03-08 DIAGNOSIS — Z79899 Other long term (current) drug therapy: Secondary | ICD-10-CM | POA: Insufficient documentation

## 2012-03-13 ENCOUNTER — Other Ambulatory Visit: Payer: Self-pay | Admitting: Oncology

## 2012-03-17 ENCOUNTER — Ambulatory Visit (HOSPITAL_COMMUNITY)
Admission: RE | Admit: 2012-03-17 | Discharge: 2012-03-17 | Disposition: A | Discharge status: Home / Self Care | Payer: Medicaid Other | Source: Ambulatory Visit | Attending: Oncology | Admitting: Oncology

## 2012-03-17 ENCOUNTER — Other Ambulatory Visit: Payer: Self-pay | Admitting: Oncology

## 2012-03-17 ENCOUNTER — Telehealth: Payer: Self-pay | Admitting: *Deleted

## 2012-03-17 DIAGNOSIS — C090 Malignant neoplasm of tonsillar fossa: Secondary | ICD-10-CM | POA: Insufficient documentation

## 2012-03-17 DIAGNOSIS — C099 Malignant neoplasm of tonsil, unspecified: Secondary | ICD-10-CM

## 2012-03-17 DIAGNOSIS — Z431 Encounter for attention to gastrostomy: Secondary | ICD-10-CM | POA: Insufficient documentation

## 2012-03-17 NOTE — Telephone Encounter (Signed)
Sister called to report 1.  Pt coughing last night,  No fevers,  Not productive.  Instructed pt may take OTC cough med such as robitussin, but if develops fever or productive cough to call his PCP or go to Urgent Care. 2.  Pt c/o soreness/pain at PEG tube site w/ some drainage around site.  States pt still not swallowing well and continues to be dependant on PEG for nutrition.   She states pt will need to arrange public transportation to get here for eval of PEG. Requests latest appt in day.  Called IR and s/w Inetta Fermo,  They can see pt no later than 2:30 pm today.   I called sister back and she will arrange transportation for pt to get here by 2:30 pm today to eval PEG tube.  She also reports pt is starting Hyperbaric Chamber treatments on Monday for an infection in his mouth.

## 2012-03-20 ENCOUNTER — Encounter (HOSPITAL_BASED_OUTPATIENT_CLINIC_OR_DEPARTMENT_OTHER): Payer: Medicaid Other

## 2012-04-03 ENCOUNTER — Encounter (HOSPITAL_BASED_OUTPATIENT_CLINIC_OR_DEPARTMENT_OTHER): Payer: Medicaid Other | Attending: Internal Medicine

## 2012-04-03 ENCOUNTER — Other Ambulatory Visit (HOSPITAL_COMMUNITY): Payer: Self-pay | Admitting: Dentistry

## 2012-04-03 DIAGNOSIS — Y842 Radiological procedure and radiotherapy as the cause of abnormal reaction of the patient, or of later complication, without mention of misadventure at the time of the procedure: Secondary | ICD-10-CM | POA: Insufficient documentation

## 2012-04-03 DIAGNOSIS — I10 Essential (primary) hypertension: Secondary | ICD-10-CM | POA: Insufficient documentation

## 2012-04-03 DIAGNOSIS — C099 Malignant neoplasm of tonsil, unspecified: Secondary | ICD-10-CM | POA: Insufficient documentation

## 2012-04-03 DIAGNOSIS — Z79899 Other long term (current) drug therapy: Secondary | ICD-10-CM | POA: Insufficient documentation

## 2012-04-03 DIAGNOSIS — T66XXXS Radiation sickness, unspecified, sequela: Secondary | ICD-10-CM | POA: Insufficient documentation

## 2012-04-03 DIAGNOSIS — M278 Other specified diseases of jaws: Secondary | ICD-10-CM | POA: Insufficient documentation

## 2012-04-03 MED ORDER — CHLORHEXIDINE GLUCONATE 0.12 % MT SOLN: OROMUCOSAL | Status: AC

## 2012-04-11 ENCOUNTER — Telehealth: Payer: Self-pay | Admitting: *Deleted

## 2012-04-11 ENCOUNTER — Other Ambulatory Visit: Payer: Self-pay | Admitting: Oncology

## 2012-04-11 MED ORDER — LIDOCAINE VISCOUS 2 % MT SOLN: 5.0000 mL | Freq: Four times a day (QID) | OROMUCOSAL | Status: DC | PRN

## 2012-04-11 NOTE — Telephone Encounter (Signed)
Call from pt's sister requesting refill on lidocaine 2% for pt's mouth pain.  States pt has been using w/ the robitussin as ordered.  Rx electronically sent to [facility].  She verbalized understanding.

## 2012-04-24 ENCOUNTER — Ambulatory Visit: Payer: Self-pay | Admitting: Oncology

## 2012-04-24 ENCOUNTER — Other Ambulatory Visit (HOSPITAL_BASED_OUTPATIENT_CLINIC_OR_DEPARTMENT_OTHER): Payer: Medicaid Other

## 2012-04-24 ENCOUNTER — Ambulatory Visit (HOSPITAL_COMMUNITY)
Admission: RE | Admit: 2012-04-24 | Discharge: 2012-04-24 | Disposition: A | Discharge status: Home / Self Care | Payer: Medicaid Other | Source: Ambulatory Visit | Attending: Oncology | Admitting: Oncology

## 2012-04-24 DIAGNOSIS — Z85819 Personal history of malignant neoplasm of unspecified site of lip, oral cavity, and pharynx: Secondary | ICD-10-CM | POA: Insufficient documentation

## 2012-04-24 DIAGNOSIS — C099 Malignant neoplasm of tonsil, unspecified: Secondary | ICD-10-CM

## 2012-04-24 DIAGNOSIS — I658 Occlusion and stenosis of other precerebral arteries: Secondary | ICD-10-CM | POA: Insufficient documentation

## 2012-04-24 DIAGNOSIS — I6529 Occlusion and stenosis of unspecified carotid artery: Secondary | ICD-10-CM | POA: Insufficient documentation

## 2012-04-24 LAB — CMP (CANCER CENTER ONLY)
AST: 30 U/L (ref 11–38)
Alkaline Phosphatase: 77 U/L (ref 26–84)
BUN, Bld: 13 mg/dL (ref 7–22)
Calcium: 9.6 mg/dL (ref 8.0–10.3)
Chloride: 98 mEq/L (ref 98–108)
Creat: 0.6 mg/dl (ref 0.6–1.2)
Glucose, Bld: 102 mg/dL (ref 73–118)

## 2012-04-24 LAB — CBC WITH DIFFERENTIAL/PLATELET
BASO%: 0.5 % (ref 0.0–2.0)
EOS%: 3.6 % (ref 0.0–7.0)
HCT: 36.4 % — ABNORMAL LOW (ref 38.4–49.9)
LYMPH%: 22.7 % (ref 14.0–49.0)
MCH: 34.5 pg — ABNORMAL HIGH (ref 27.2–33.4)
MCHC: 33.2 g/dL (ref 32.0–36.0)
NEUT%: 50.1 % (ref 39.0–75.0)
Platelets: 161 10*3/uL (ref 140–400)

## 2012-04-24 MED ORDER — IOHEXOL 300 MG/ML  SOLN
100.0000 mL | Freq: Once | INTRAMUSCULAR | Status: AC | PRN
  Administered 2012-04-24: 100 mL via INTRAVENOUS

## 2012-04-26 ENCOUNTER — Encounter: Payer: Self-pay | Admitting: Oncology

## 2012-04-26 ENCOUNTER — Telehealth: Payer: Self-pay | Admitting: Oncology

## 2012-04-26 ENCOUNTER — Ambulatory Visit (HOSPITAL_BASED_OUTPATIENT_CLINIC_OR_DEPARTMENT_OTHER): Payer: Medicaid Other | Admitting: Oncology

## 2012-04-26 VITALS — BP 121/80 | HR 73 | Temp 97.8°F | Ht 66.0 in | Wt 131.8 lb

## 2012-04-26 DIAGNOSIS — R590 Localized enlarged lymph nodes: Secondary | ICD-10-CM

## 2012-04-26 DIAGNOSIS — R599 Enlarged lymph nodes, unspecified: Secondary | ICD-10-CM

## 2012-04-26 DIAGNOSIS — C099 Malignant neoplasm of tonsil, unspecified: Secondary | ICD-10-CM

## 2012-04-26 HISTORY — DX: Localized enlarged lymph nodes: R59.0

## 2012-04-26 NOTE — Patient Instructions (Addendum)
1.  CT result 04/24/2012: concerning for nodal recurrence.   Findings: Post-treatment changes of the left tongue base and  glossotonsillar sulcus are stable. There is no evidence for an  enlarging mass. The parapharyngeal fat is stable.  No mucosal or submucosal lesion is evident. Postradiation changes  are present throughout the oropharynx and larynx. There is  thickening of the platysma and stranding of the subcutaneous fat,  to a slightly lesser extent than on the prior study.  The previously seen right supraclavicular lymph node has decreased  in size since the prior study. It now measures 8 mm in long  access. The left supraclavicular nodal disease has also decreased  in size. The largest lesion measures 6 mm in long access.  A posterior left level II lymph node on image 41 of series 2  measures 5.8 mm, decreased in size from 7.6 mm.  A more anterior left level II lymph node has increased in size.  There is peripheral enhancement of the node which measures 15 x 8 x  8 mm. There is no significant right-sided adenopathy.  Atherosclerotic calcifications and luminal irregularity in the  proximal internal carotid arteries bilaterally is stable. The bone  windows are unremarkable. No focal lytic or blastic lesions are  evident. The patient is edentulous.   IMPRESSION:   1. Interval increase in size of an anterior left level II lymph  node with peripheral enhancement. The lesion now measures 15 x 8 x  8 mm.  2. A more posterior left level II lymph node and bilateral  supraclavicular lymph nodes have decreased in size since the prior  exam.  3. No evidence for residual or recurrent disease of the primary  site.  4. Extensive post-treatment changes.   B.  Follow up:  With Dr. Ezzard Standing within 2 weeks to see if neck resection is indicated.  C.  PLEASE STOP SMOKING.

## 2012-04-26 NOTE — Telephone Encounter (Signed)
Gave pt appt for November 2014 lab and Ct, then MD visit

## 2012-04-26 NOTE — Progress Notes (Signed)
[facility]  [phone] [phone]   OFFICE PROGRESS NOTE   Cc:  [name], MD  DIAGNOSIS: History of left tonsil squamous cell carcinoma; cT2 N2c M0.   PAST THERAPY: definitive concurrent chemoradiation with daily XRT and weekly cisplatin 40mg /m2 on between 02/01/2011 and 03/22/2011. He terminately chemotherapy prematurely with last dose given on 02/22/2011 due to prolonged cytopenia.   CURRENT THERAPY: watchful observation.   INTERVAL HISTORY: Russell Williamson 56 y.o. male returns for regular follow up by himself.  He no longer smokes regular cigarrettes.  He now only smokes electronic cigarettes about twice daily.  He still has diffuse thickening of his neck.  He is able to feel a knot in the left neck which has only slowly increased in size over the last few weeks.  He still has xerostomia and dysphagia of hard foods. He uses PEG tube; his weight is increasing.  He no longer has recurrent abdominal pain from pancreatitis.  He has not been drinking. 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   Patient denies fever, headache, visual changes, confusion, drenching night sweats, mucositis, odynophagia, dysphagia, nausea vomiting, jaundice, chest pain, palpitation, shortness of breath, dyspnea on exertion, productive cough, gum bleeding, epistaxis, hematemesis, hemoptysis, abdominal pain, abdominal swelling, early satiety, melena, hematochezia, hematuria, skin rash, spontaneous bleeding, joint swelling, joint pain, heat or cold intolerance, bowel bladder incontinence, back pain, focal motor weakness, paresthesia, depression, suicidal or homocidal ideation, feeling hopelessness.   Past Medical History  Diagnosis Date  . Tonsillar cancer     tonsillar ca dx 12/27/10  . Hypertension   . Chronic pancreatitis   . Arthritis   . Hiatal hernia   . Seizures     alcohol-related  . Hx of radiation therapy 02/02/11 to  03/22/11    L tonsil  . Cancer 12/15/10    left tonsillar squamous cell carcinoma  . Cervical adenopathy 04/26/2012    Past Surgical History  Procedure Date  . Cholecystectomy 04/2005  . Bile duct stent placement     hx of    Current Outpatient Prescriptions  Medication Sig Dispense Refill  . buPROPion (WELLBUTRIN XL) 150 MG 24 hr tablet TAKE ONE TABLET BY MOUTH DAILY  90 tablet  1  . Cholecalciferol (VITAMIN D) 1000 UNITS capsule Take 1,000 Units by mouth daily.      . fentaNYL (DURAGESIC - DOSED MCG/HR) 50 MCG/HR Place 1 patch onto the skin every 3 (three) days.      . fish oil-omega-3 fatty acids 1000 MG capsule Take 1 g by mouth daily.      . Jevity 1.5 Cal (JEVITY 1.5) LIQD Place 1,000 mLs into feeding tube daily.      Marland Kitchen lidocaine (XYLOCAINE) 2 % solution Take 5 mLs by mouth every 6 (six) hours as needed. TAKE 5 ML BY MOUTH WITH 5 ML OF ROBITUSSIN. GARLE AND SPIT EVERY 6 HOURS  500 mL  0  . lipase/protease/amylase (CREON) 12000 UNITS CPEP Take 1 capsule by mouth 4 (four) times daily.      Marland Kitchen LORazepam (ATIVAN) 0.5 MG tablet Take 0.5 mg by mouth every 6 (six) hours as needed.        . metoprolol (LOPRESSOR) 50 MG tablet Take 50 mg by mouth 2 (two) times daily.      . nicotine (NICODERM CQ - DOSED IN MG/24 HR) 7 mg/24hr patch Place 1 patch onto the skin daily.      Marland Kitchen  omeprazole (PRILOSEC) 40 MG capsule Take 40 mg by mouth daily.       Marland Kitchen oxyCODONE (ROXICODONE) 15 MG immediate release tablet Take 15 mg by mouth every 4 (four) hours as needed. pain      . promethazine (PHENERGAN) 25 MG tablet Take 25 mg by mouth every 6 (six) hours as needed.        . senna (SENOKOT) 8.6 MG tablet Take 2 tablets by mouth daily.        . vitamin E (VITAMIN E) 400 UNIT capsule Take 400 Units by mouth 2 (two) times daily.        Marland Kitchen zolpidem (AMBIEN) 10 MG tablet Take 10 mg by mouth at bedtime as needed. sleep        ALLERGIES:   has no known allergies.  REVIEW OF SYSTEMS:  The rest of the 14-point review  of system was negative.   Filed Vitals:   04/26/12 1019  BP: 121/80  Pulse: 73  Temp: 97.8 F (36.6 C)   Wt Readings from Last 3 Encounters:  04/26/12 131 lb 12.8 oz (59.784 kg)  12/24/11 124 lb (56.246 kg)  11/10/11 120 lb 6.4 oz (54.613 kg)   ECOG Performance status: 1  PHYSICAL EXAMINATION:   General:  well-nourished man, in no acute distress.  Eyes:  no scleral icterus.  ENT:  Left upper jaw on the left, there was a dark ulcer without bleeding or purulent discharge.  Neck was without thyromegaly.   There was hyperpigmented and hyperkaratotic changes in the bilateral neck.  Lymphatics:  Negative for supraclavicular or axillary adenopathy.  It was difficult to palpate the left cervical neck note.  Respiratory: lungs were clear bilaterally without wheezing or crackles.  Cardiovascular:  Regular rate and rhythm, S1/S2, without murmur, rub or gallop.  There was no pedal edema.  GI:  abdomen was soft, flat, nontender, nondistended, without organomegaly.  Muscoloskeletal:  no spinal tenderness of palpation of vertebral spine.  Skin exam was without echymosis, petichae.  Neuro exam was nonfocal.  Patient was able to get on and off exam table without assistance.  Gait was normal.  Patient was alerted and oriented.  Attention was good.   Language was appropriate.  Mood was normal without depression.  Speech was not pressured.  Thought content was not tangential.     LABORATORY/RADIOLOGY DATA:  Lab Results  Component Value Date   WBC 2.6* 04/24/2012   HGB 12.1* 04/24/2012   HCT 36.4* 04/24/2012   PLT 161 04/24/2012   GLUCOSE 102 04/24/2012   CHOL  Value: 91        ATP III CLASSIFICATION:  <200     mg/dL   Desirable  161-096  mg/dL   Borderline High  >=045    mg/dL   High 01/10/8118   TRIG 44 06/18/2008   HDL 40 06/18/2008   LDLCALC  Value: 42        Total Cholesterol/HDL:CHD Risk Coronary Heart Disease Risk Table                     Men   Women  1/2 Average Risk   3.4   3.3 06/18/2008   ALKPHOS 77  04/24/2012   ALT 15 10/17/2011   AST 30 04/24/2012   NA 140 04/24/2012   K 4.1 04/24/2012   CL 98 04/24/2012   CREATININE 0.6 04/24/2012   BUN 13 04/24/2012   CO2 30 04/24/2012   INR 1.09 06/08/2011  Ct Soft Tissue Neck W Contrast  04/24/2012  *RADIOLOGY REPORT*  Clinical Data: 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  CT NECK WITH CONTRAST  Technique:  Multidetector CT imaging of the neck was performed with intravenous contrast.  Contrast: OMNIPAQUE IOHEXOL 300 MG/ML  SOLN  Comparison: CT neck with contrast 09/14/2011.  PET scan of the head and neck 05/25/2011.  Findings: Post-treatment changes of the left tongue base and glossotonsillar sulcus are stable.  There is no evidence for an enlarging mass.  The parapharyngeal fat is stable.  No mucosal or submucosal lesion is evident.  Postradiation changes are present throughout the oropharynx and larynx.  There is thickening of the platysma and stranding of the subcutaneous fat, to a slightly lesser extent than on the prior study.  The previously seen right supraclavicular lymph node has decreased in size since the prior study.  It now measures 8 mm in long access.  The left supraclavicular nodal disease has also decreased in size.  The largest lesion measures 6 mm in long access.  A posterior left level II lymph node on image 41 of series 2 measures 5.8 mm, decreased in size from 7.6 mm.  A more anterior left level II lymph node has increased in size. There is peripheral enhancement of the node which measures 15 x 8 x 8 mm.  There is no significant right-sided adenopathy. Atherosclerotic calcifications and luminal irregularity in the proximal internal carotid arteries bilaterally is stable.  The bone windows are unremarkable.  No focal lytic or blastic lesions are evident.  The patient is edentulous.  IMPRESSION:  1.  Interval increase in size of an anterior left level II lymph node with peripheral enhancement.  The lesion now measures  15 x 8 x 8 mm. 2.  A more posterior left level II lymph node and bilateral supraclavicular lymph nodes have decreased in size since the prior exam. 3.  No evidence for residual or recurrent disease of the primary site. 4.  Extensive post-treatment changes.  Original Report Authenticated By: Jamesetta Orleans. MATTERN, M.D.    ASSESSMENT AND PLAN:   1. 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  I personally contact Dr. Allene Pyo office asking that patient be seen within 2 weeks to see if left neck dissection is indicated.    2. Calorie protein malnutrition. Not using denture due to osteonecrosis of the jaw.   3. Recent history of pancreatitis. He is completely off of alcohol at this time. Unclear etiology of this most recent pancreatitis. He follows up with his PCP.  4. Submental edema: Due to treatment for his head/neck cancer. I referred him to Lymphedema clinic for exercise to decrease this edema.  5. Smoking: He has tried nicotine, Chantix, and smokeless cigarettes without success at quitting. I advised him that he is at very high risk of recurrence with continuing to smoke. I will try cold [place] soon. I advised him to move out of his sister house and remove himself from people who smoke to limit the peer pressure.  6. 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 He does not have SOB, wheezing typical of acute SVC syndrome. I discussed this case with Dr. Mitzi Hansen recently, but do not personally see a role for anticoagulation given its chronicity.  7. Mild cytopenia: Most likely due to recent chemo and recent pancreatitis. I have low clinical suspicion for primary bone  marrow process at this time. I'll continue to monitor his CBC.  8. Follow up:  In about 4 months with repeat CT neck to ensure stability of disease in the neck.      The length of time of the face-to-face  encounter was 15 minutes. More than 50% of time was spent counseling and coordination of care.

## 2012-05-04 ENCOUNTER — Encounter (HOSPITAL_BASED_OUTPATIENT_CLINIC_OR_DEPARTMENT_OTHER): Payer: Medicaid Other | Attending: General Surgery

## 2012-05-04 DIAGNOSIS — Y842 Radiological procedure and radiotherapy as the cause of abnormal reaction of the patient, or of later complication, without mention of misadventure at the time of the procedure: Secondary | ICD-10-CM | POA: Insufficient documentation

## 2012-05-04 DIAGNOSIS — M278 Other specified diseases of jaws: Secondary | ICD-10-CM | POA: Insufficient documentation

## 2012-05-04 DIAGNOSIS — C099 Malignant neoplasm of tonsil, unspecified: Secondary | ICD-10-CM | POA: Insufficient documentation

## 2012-05-08 ENCOUNTER — Encounter (HOSPITAL_BASED_OUTPATIENT_CLINIC_OR_DEPARTMENT_OTHER): Payer: Medicaid Other

## 2012-05-09 ENCOUNTER — Other Ambulatory Visit: Payer: Self-pay | Admitting: Radiation Oncology

## 2012-05-15 ENCOUNTER — Encounter (HOSPITAL_COMMUNITY): Payer: Self-pay | Admitting: Dentistry

## 2012-05-15 ENCOUNTER — Other Ambulatory Visit: Payer: Self-pay | Admitting: Oncology

## 2012-05-15 ENCOUNTER — Ambulatory Visit (HOSPITAL_COMMUNITY): Payer: Medicaid - Dental | Admitting: Dentistry

## 2012-05-15 VITALS — BP 127/73 | HR 73 | Temp 98.5°F

## 2012-05-15 DIAGNOSIS — K08109 Complete loss of teeth, unspecified cause, unspecified class: Secondary | ICD-10-CM

## 2012-05-15 DIAGNOSIS — K117 Disturbances of salivary secretion: Secondary | ICD-10-CM

## 2012-05-15 DIAGNOSIS — M272 Inflammatory conditions of jaws: Secondary | ICD-10-CM

## 2012-05-15 DIAGNOSIS — K121 Other forms of stomatitis: Secondary | ICD-10-CM

## 2012-05-15 DIAGNOSIS — K140 Glossitis: Secondary | ICD-10-CM

## 2012-05-15 DIAGNOSIS — M278 Other specified diseases of jaws: Secondary | ICD-10-CM

## 2012-05-15 MED ORDER — LIDOCAINE VISCOUS 2 % MT SOLN: 5.0000 mL | Freq: Four times a day (QID) | OROMUCOSAL | Status: DC | PRN

## 2012-05-15 NOTE — Progress Notes (Signed)
05/15/2012   Patient:            Russell Williamson Date of Birth:  1956-10-03 MRN:                [id number]  BP 127/73  Pulse 73  Temp 98.5 F (36.9 C)  Past Medical History  Diagnosis Date  . Tonsillar cancer     tonsillar ca dx 12/27/10  . Hypertension   . Chronic pancreatitis   . Arthritis   . Hiatal hernia   . Seizures     alcohol-related  . Hx of radiation therapy 02/02/11 to 03/22/11    L tonsil  . Cancer 12/15/10    left tonsillar squamous cell carcinoma  . Cervical adenopathy 04/26/2012  No Known Allergies  Current Outpatient Prescriptions  Medication Sig Dispense Refill  . buPROPion (WELLBUTRIN XL) 150 MG 24 hr tablet TAKE ONE TABLET BY MOUTH DAILY  90 tablet  1  . Cholecalciferol (VITAMIN D) 1000 UNITS capsule Take 1,000 Units by mouth daily.      . fish oil-omega-3 fatty acids 1000 MG capsule Take 1 g by mouth daily.      . Jevity 1.5 Cal (JEVITY 1.5) LIQD Place 1,000 mLs into feeding tube daily.      Marland Kitchen lidocaine (XYLOCAINE) 2 % solution Take 5 mLs by mouth every 6 (six) hours as needed. TAKE 5 ML BY MOUTH WITH 5 ML OF ROBITUSSIN. GARLE AND SPIT EVERY 6 HOURS  500 mL  0  . lipase/protease/amylase (CREON) 12000 UNITS CPEP Take 1 capsule by mouth 4 (four) times daily.      Marland Kitchen LORazepam (ATIVAN) 0.5 MG tablet Take 0.5 mg by mouth every 6 (six) hours as needed.        . metoprolol (LOPRESSOR) 50 MG tablet Take 50 mg by mouth 2 (two) times daily.      Marland Kitchen omeprazole (PRILOSEC) 40 MG capsule Take 40 mg by mouth daily.       Marland Kitchen oxyCODONE (ROXICODONE) 15 MG immediate release tablet Take 15 mg by mouth every 4 (four) hours as needed. pain      . vitamin E (VITAMIN E) 400 UNIT capsule Take 400 Units by mouth 2 (two) times daily.        Marland Kitchen zolpidem (AMBIEN) 10 MG tablet Take 10 mg by mouth at bedtime as needed. sleep      . fentaNYL (DURAGESIC - DOSED MCG/HR) 50 MCG/HR Place 1 patch onto the skin every 3 (three) days.      . nicotine (NICODERM CQ - DOSED IN MG/24 HR) 7 mg/24hr patch  Place 1 patch onto the skin daily.      . pentoxifylline (TRENTAL) 400 MG CR tablet TAKE ONE TABLET BY MOUTH THREE TIMES DAILY WITH MEALS  270 tablet  0  . promethazine (PHENERGAN) 25 MG tablet Take 25 mg by mouth every 6 (six) hours as needed.        . senna (SENOKOT) 8.6 MG tablet Take 2 tablets by mouth daily.         [name] presents for re-evaluation of exposed bone in the maxillary left quadrant area 15-16 as well as mandibular right lingaul alveolar ridge area #31.  Subjective: Patient indicates the exposed bone is still present on the upper left but that lower right area has healed in completely. Patient has not been wearing dentures by report. Patient is till getting daily hyperbaric oxygen therapy daily and has 11 days of therapy remaining. Patient is still smoking "  one to two cigarettes a day"by report. Patient encouraged again to stop smoking.  Patient is still using chlorhexidine rinses 3 times daily as prescribed. Patient indicates that he recently saw Dr. Narda Bonds for evaluation of possible left neck persistence. Patient with anticipated neck dissection in the future with Dr. Ezzard Standing.  Exam: Patient is edentulous with less erythema noted after HBO therapy.  Patient has persistent xerostomia . Patient has persistent area of exposed bone that measures 3 mm x 5 mm in the area of tooth #15 alveolar ridge. Will discuss sequestrectomy today. Patient no longer has the small 1 x 1 mm area on the lingual alveolar bone proximately tooth #31 area. This is now covered completely by soft tissue. The patient has an ulceration along the left lateral tongue posterior area most likely secondary to a biopsy by Dr. Ezzard Standing per patient report.   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 2. Edentulous 3. Severe xerostomia-radiation induced. 4. Erythema of soft tissue this has resolved almost completely with the hyperbaric oxygen therapy. 5. Left lateral  tongue with an ulceration likely secondary to a biopsy taken by Dr. Ezzard Standing on 05/12/2012.  Plan: 1. Discuss sequestrectomy with patient-- today. We discussed the risks, benefits, complications for removal of the maxillary left bony sequestrum today. Patient agrees to proceed with sequestrectomy today. Procedure:  Local anesthesia applied to the surrounding tissues of the bony sequestrum. Bony sequestrum was then removed with a rongeur. Slight heme noted. No obvious bone above the level of the soft tissue was noted. Area was irrigated with copious amounts of sterile saline. The area should heal in well within the next week, especially with hyperbaric oxygen therapy. Patient tolerated procedure well. 2. Continue with [facility] for hyperbaric oxygen therapy. 2. Patient instructed to bring in dentures for evaluation for adjustment unless instructed not to do so by [facility]. 3. Patient to use chlorhexidine rinses 3 times daily as instructed.  4. Patient also instructed to again try to stop smoking. 5. Patient also may use salt water rinses as needed.  Patient dismissed in stable condition.  Dr. Cindra Eves

## 2012-05-16 ENCOUNTER — Encounter (HOSPITAL_COMMUNITY): Payer: Self-pay | Admitting: Pharmacy Technician

## 2012-05-19 ENCOUNTER — Other Ambulatory Visit (HOSPITAL_COMMUNITY): Payer: Self-pay

## 2012-05-19 ENCOUNTER — Encounter (HOSPITAL_COMMUNITY): Payer: Self-pay

## 2012-05-19 NOTE — Pre-Procedure Instructions (Signed)
20 Russell Williamson  05/19/2012   Your procedure is scheduled on:  05-23-2012  Report to [facility] at 5:30  AM.  Call this number if you have problems the morning of surgery: [phone]   Remember:   Do not eat food or drink:After Midnight.    .  Take these medicines the morning of surgery with A SIP OF WATER: wellbutrin,metoprolol(Lopressor),omeprazole(Prilosec),pain medication as needed   Do not wear jewelry, make-up or nail polish.  Do not wear lotions, powders, or perfumes. You may wear deodorant.  Do not shave 48 hours prior to surgery. Men may shave face and neck.  Do not bring valuables to the hospital.  Contacts, dentures or bridgework may not be worn into surgery.  Leave suitcase in the car. After surgery it may be brought to your room.  For patients admitted to the hospital, checkout time is 11:00 AM the day of discharge.    Special Instructions: CHG Shower Use Special Wash: 1/2 bottle night before surgery and 1/2 bottle morning of surgery.   Please read over the following fact sheets that you were given: Pain Booklet, MRSA Information and Surgical Site Infection Prevention

## 2012-05-22 ENCOUNTER — Encounter (HOSPITAL_COMMUNITY)
Admission: RE | Admit: 2012-05-22 | Discharge: 2012-05-22 | Disposition: A | Discharge status: Home / Self Care | Payer: Medicaid Other | Source: Ambulatory Visit | Attending: Otolaryngology | Admitting: Otolaryngology

## 2012-05-22 ENCOUNTER — Encounter (HOSPITAL_COMMUNITY): Payer: Self-pay

## 2012-05-22 HISTORY — DX: Hesitancy of micturition: R39.11

## 2012-05-22 HISTORY — DX: Constipation, unspecified: K59.00

## 2012-05-22 LAB — CBC
Hemoglobin: 12.8 g/dL — ABNORMAL LOW (ref 13.0–17.0)
MCV: 102.6 fL — ABNORMAL HIGH (ref 78.0–100.0)
Platelets: 137 10*3/uL — ABNORMAL LOW (ref 150–400)
RBC: 3.79 MIL/uL — ABNORMAL LOW (ref 4.22–5.81)
WBC: 3.6 10*3/uL — ABNORMAL LOW (ref 4.0–10.5)

## 2012-05-22 LAB — BASIC METABOLIC PANEL
CO2: 28 mEq/L (ref 19–32)
Calcium: 9.8 mg/dL (ref 8.4–10.5)
GFR calc non Af Amer: >90 mL/min (ref >90)
Glucose, Bld: 95 mg/dL (ref 70–99)
Potassium: 4.5 mEq/L (ref 3.5–5.1)
Sodium: 139 mEq/L (ref 135–145)

## 2012-05-22 LAB — SURGICAL PCR SCREEN: Staphylococcus aureus: POSITIVE — AB

## 2012-05-22 MED ORDER — CEFAZOLIN SODIUM-DEXTROSE 2-3 GM-% IV SOLR
2.0000 g | INTRAVENOUS | Status: AC
  Administered 2012-05-23: 2 g via INTRAVENOUS
  Filled 2012-05-22: qty 50

## 2012-05-22 NOTE — Progress Notes (Signed)
Anesthesia to review EKG.

## 2012-05-22 NOTE — Consult Note (Signed)
Anesthesiology chart review:  Mr. Russell Williamson is a 56 year old male with a history of left neck cancer who is scheduled for direct laryngoscopy and biopsy with radical neck dissection on 05/23/2012. His ECG and lab studies were reviewed and the data appears to be acceptable for surgery in the morning.  Kipp Brood M.D.

## 2012-05-22 NOTE — H&P (Signed)
PREOPERATIVE H&P  Chief Complaint: Left neck cancer  HPI: Russell Williamson is a 56 y.o. male who presents for evaluation of persistent left neck node following chemo-radiation treatment for T2N2 left tonsil cancer.He was originally diagnosed with L tonsil cancer in March of 2012. He underwent chemo-radiation therapy as his primary treatment modality. He recently had a repeat CT scan done 1 months ago that showed an enlarging left neck node which was suspicious for metastatic cancer. He's taken to the OR for DL possible biopsy and L neck dissection.   Past Medical History  Diagnosis Date  . Hypertension   . Chronic pancreatitis   . Arthritis   . Hx of radiation therapy 02/02/11 to 03/22/11    L tonsil  . Cancer 12/15/10    left tonsillar squamous cell carcinoma  . Seizures     alcohol-related  . Tonsillar cancer     tonsillar ca dx 12/27/10  . Cervical adenopathy 04/26/2012  . Urinary hesitancy   . Constipation    Past Surgical History  Procedure Date  . Cholecystectomy 04/2005  . Bile duct stent placement     hx of  . Fracture surgery     broken tibia left leg   History   Social History  . Marital Status: Single    Spouse Name: N/A    Number of Children: N/A  . Years of Education: N/A   Social History Main Topics  . Smoking status: Current Everyday Smoker -- 0.2 packs/day for 30 years    Types: Cigarettes  . Smokeless tobacco: Never Used   Comment: hx 1/2 -1 PPD  . Alcohol Use: No     hx alcohol abuse, quit 12 -13 yrs ago  . Drug Use: No  . Sexually Active:    Other Topics Concern  . Not on file   Social History Narrative  . No narrative on file   Family History  Problem Relation Age of Onset  . COPD Mother    No Known Allergies Prior to Admission medications   Medication Sig Start Date End Date Taking? Authorizing Provider  buPROPion (WELLBUTRIN XL) 150 MG 24 hr tablet Take 150 mg by mouth daily.   Yes Historical Provider, MD  Cholecalciferol (VITAMIN D PO)  Take 1 tablet by mouth daily.   Yes Historical Provider, MD  fentaNYL (DURAGESIC - DOSED MCG/HR) 50 MCG/HR Place 1 patch onto the skin every 3 (three) days.   Yes Historical Provider, MD  lipase/protease/amylase (CREON) 12000 UNITS CPEP Take 1 capsule by mouth 4 (four) times daily.   Yes Historical Provider, MD  metoprolol (LOPRESSOR) 50 MG tablet Take 50 mg by mouth 2 (two) times daily.   Yes Historical Provider, MD  Nutritional Supplements (ENSURE PO) Take 1 Can by mouth 3 (three) times daily.   Yes Historical Provider, MD  Nutritional Supplements (OSMOLITE 1.5 CAL PO) Give 7 Cans by tube See admin instructions. 1 can=8 oz.  7 cans throughout the day   Yes Historical Provider, MD  Omega-3 Fatty Acids (FISH OIL PO) Take 1 capsule by mouth daily.   Yes Historical Provider, MD  omeprazole (PRILOSEC) 40 MG capsule Take 40 mg by mouth daily.    Yes Historical Provider, MD  oxyCODONE (ROXICODONE) 15 MG immediate release tablet Take 15 mg by mouth every 4 (four) hours.    Yes Historical Provider, MD  pentoxifylline (TRENTAL) 400 MG CR tablet Take 400 mg by mouth 3 (three) times daily with meals.   Yes Historical Provider, MD  promethazine (PHENERGAN) 25 MG tablet Take 25 mg by mouth every 6 (six) hours as needed. For nausea   Yes Historical Provider, MD  zolpidem (AMBIEN) 10 MG tablet Take 10 mg by mouth at bedtime.    Yes Historical Provider, MD     Positive ROS: receiving hyperbaric O2 for exposed palate bone                         Hx pancreatitis with abdominal pain  All other systems have been reviewed and were otherwise negative with the exception of those mentioned in the HPI and as above.  Physical Exam: There were no vitals filed for this visit.  General: Alert, no acute distress Oral: Normal oral mucosa. Small area of exposed palatal bone on the L. Tonsil regions appear clear. Left lateral tongue biopsy showed candida Nasal: Clear nasal passages Neck: left neck enduration with left  neck 1.5cm node level 2 noted on CT scan Lungs: clear Cardiac: RRR no mumur Abdomen: soft nontender Neuro: intact    Impression: L neck lymphadenopathy suspicious for cancer  Plan: DL and Bx. Left neck dissection                                                    Ear: Ear canal is clear with normal appearing TMs Cardiovascular: Regular rate and rhythm, no murmur.  Respiratory: Clear to auscultation Neurologic: Alert and oriented x 3   Assessment/Plan: left neck cancer  Plan for Procedure(s): DIRECT LARYNGOSCOPY NECK MASS BIOPSY RADICAL NECK DISSECTION   Dillard Cannon, MD 05/22/2012 4:11 PM

## 2012-05-23 ENCOUNTER — Inpatient Hospital Stay (HOSPITAL_COMMUNITY)
Admission: RE | Admit: 2012-05-23 | Discharge: 2012-05-25 | DRG: 821 | Disposition: A | Discharge status: Home / Self Care | Payer: Medicaid Other | Source: Ambulatory Visit | Attending: Otolaryngology | Admitting: Otolaryngology

## 2012-05-23 ENCOUNTER — Ambulatory Visit (HOSPITAL_COMMUNITY): Payer: Medicaid Other | Admitting: Anesthesiology

## 2012-05-23 ENCOUNTER — Encounter (HOSPITAL_COMMUNITY)
Admission: RE | Disposition: A | Discharge status: Home / Self Care | Payer: Self-pay | Source: Ambulatory Visit | Attending: Otolaryngology

## 2012-05-23 ENCOUNTER — Encounter (HOSPITAL_COMMUNITY): Payer: Self-pay | Admitting: Anesthesiology

## 2012-05-23 ENCOUNTER — Encounter (HOSPITAL_COMMUNITY): Payer: Self-pay | Admitting: General Practice

## 2012-05-23 DIAGNOSIS — Z79899 Other long term (current) drug therapy: Secondary | ICD-10-CM

## 2012-05-23 DIAGNOSIS — R519 Headache, unspecified: Secondary | ICD-10-CM

## 2012-05-23 DIAGNOSIS — I10 Essential (primary) hypertension: Secondary | ICD-10-CM | POA: Diagnosis present

## 2012-05-23 DIAGNOSIS — Z01812 Encounter for preprocedural laboratory examination: Secondary | ICD-10-CM

## 2012-05-23 DIAGNOSIS — C770 Secondary and unspecified malignant neoplasm of lymph nodes of head, face and neck: Principal | ICD-10-CM | POA: Diagnosis present

## 2012-05-23 DIAGNOSIS — F1011 Alcohol abuse, in remission: Secondary | ICD-10-CM | POA: Diagnosis present

## 2012-05-23 DIAGNOSIS — Z9089 Acquired absence of other organs: Secondary | ICD-10-CM

## 2012-05-23 DIAGNOSIS — F17200 Nicotine dependence, unspecified, uncomplicated: Secondary | ICD-10-CM | POA: Diagnosis present

## 2012-05-23 DIAGNOSIS — Z85819 Personal history of malignant neoplasm of unspecified site of lip, oral cavity, and pharynx: Secondary | ICD-10-CM

## 2012-05-23 DIAGNOSIS — C760 Malignant neoplasm of head, face and neck: Secondary | ICD-10-CM

## 2012-05-23 DIAGNOSIS — K861 Other chronic pancreatitis: Secondary | ICD-10-CM | POA: Diagnosis present

## 2012-05-23 DIAGNOSIS — M129 Arthropathy, unspecified: Secondary | ICD-10-CM | POA: Diagnosis present

## 2012-05-23 DIAGNOSIS — K222 Esophageal obstruction: Secondary | ICD-10-CM | POA: Diagnosis present

## 2012-05-23 DIAGNOSIS — Z23 Encounter for immunization: Secondary | ICD-10-CM

## 2012-05-23 DIAGNOSIS — Z931 Gastrostomy status: Secondary | ICD-10-CM

## 2012-05-23 HISTORY — DX: Headache: R51

## 2012-05-23 HISTORY — DX: Headache, unspecified: R51.9

## 2012-05-23 HISTORY — PX: MASS BIOPSY: SHX5445

## 2012-05-23 HISTORY — DX: Personal history of other medical treatment: Z92.89

## 2012-05-23 HISTORY — PX: RADICAL NECK DISSECTION: SHX2284

## 2012-05-23 HISTORY — DX: Malignant neoplasm of head, face and neck: C76.0

## 2012-05-23 HISTORY — PX: DIRECT LARYNGOSCOPY: SHX5326

## 2012-05-23 SURGERY — LARYNGOSCOPY, DIRECT
Anesthesia: General | Site: Neck | Wound class: Clean Contaminated

## 2012-05-23 MED ORDER — LIDOCAINE-EPINEPHRINE 1 %-1:100000 IJ SOLN
INTRAMUSCULAR | Status: DC | PRN
  Administered 2012-05-23: 20 mL

## 2012-05-23 MED ORDER — HYDROMORPHONE HCL PF 1 MG/ML IJ SOLN
INTRAMUSCULAR | Status: AC
  Administered 2012-05-23: 0.5 mg via INTRAVENOUS
  Filled 2012-05-23: qty 1

## 2012-05-23 MED ORDER — FENTANYL CITRATE 0.05 MG/ML IJ SOLN
INTRAMUSCULAR | Status: DC | PRN
  Administered 2012-05-23 (×2): 100 ug via INTRAVENOUS
  Administered 2012-05-23: 150 ug via INTRAVENOUS

## 2012-05-23 MED ORDER — VITAMIN D3 25 MCG (1000 UNIT) PO TABS
1000.0000 [IU] | ORAL_TABLET | Freq: Every day | ORAL | Status: DC
  Administered 2012-05-24 – 2012-05-25 (×2): 1000 [IU] via ORAL
  Filled 2012-05-23 (×3): qty 1

## 2012-05-23 MED ORDER — BACITRACIN ZINC 500 UNIT/GM EX OINT
TOPICAL_OINTMENT | CUTANEOUS | Status: DC | PRN
  Administered 2012-05-23: 1 via TOPICAL

## 2012-05-23 MED ORDER — ONDANSETRON HCL 4 MG/2ML IJ SOLN
INTRAMUSCULAR | Status: DC | PRN
  Administered 2012-05-23: 4 mg via INTRAVENOUS

## 2012-05-23 MED ORDER — ZOLPIDEM TARTRATE 5 MG PO TABS: 5.0000 mg | ORAL_TABLET | Freq: Every evening | ORAL | Status: DC | PRN

## 2012-05-23 MED ORDER — PANTOPRAZOLE SODIUM 40 MG PO TBEC
80.0000 mg | DELAYED_RELEASE_TABLET | Freq: Every day | ORAL | Status: DC
  Administered 2012-05-24: 80 mg via ORAL
  Filled 2012-05-23: qty 2

## 2012-05-23 MED ORDER — MORPHINE SULFATE 2 MG/ML IJ SOLN
2.0000 mg | INTRAMUSCULAR | Status: DC | PRN
  Administered 2012-05-23 – 2012-05-24 (×3): 2 mg via INTRAVENOUS
  Administered 2012-05-25: 4 mg via INTRAVENOUS
  Filled 2012-05-23: qty 1
  Filled 2012-05-23: qty 2
  Filled 2012-05-23 (×2): qty 1

## 2012-05-23 MED ORDER — ROCURONIUM BROMIDE 100 MG/10ML IV SOLN
INTRAVENOUS | Status: DC | PRN
  Administered 2012-05-23: 20 mg via INTRAVENOUS
  Administered 2012-05-23: 10 mg via INTRAVENOUS

## 2012-05-23 MED ORDER — METOPROLOL TARTRATE 50 MG PO TABS
50.0000 mg | ORAL_TABLET | Freq: Two times a day (BID) | ORAL | Status: DC
  Administered 2012-05-23 – 2012-05-25 (×4): 50 mg via ORAL
  Filled 2012-05-23 (×5): qty 1

## 2012-05-23 MED ORDER — HYDROMORPHONE HCL PF 1 MG/ML IJ SOLN
0.5000 mg | INTRAMUSCULAR | Status: AC | PRN
  Administered 2012-05-23 (×4): 0.5 mg via INTRAVENOUS

## 2012-05-23 MED ORDER — EPINEPHRINE HCL (NASAL) 0.1 % NA SOLN
NASAL | Status: AC
  Filled 2012-05-23: qty 30

## 2012-05-23 MED ORDER — PANCRELIPASE (LIP-PROT-AMYL) 12000-38000 UNITS PO CPEP
1.0000 | ORAL_CAPSULE | Freq: Three times a day (TID) | ORAL | Status: DC
  Administered 2012-05-24 – 2012-05-25 (×4): 1 via ORAL
  Filled 2012-05-23 (×8): qty 1

## 2012-05-23 MED ORDER — ENSURE PO LIQD: 1.0000 | Freq: Three times a day (TID) | ORAL | Status: DC

## 2012-05-23 MED ORDER — KCL IN DEXTROSE-NACL 20-5-0.45 MEQ/L-%-% IV SOLN
INTRAVENOUS | Status: DC
  Administered 2012-05-23 – 2012-05-25 (×4): via INTRAVENOUS
  Filled 2012-05-23 (×6): qty 1000

## 2012-05-23 MED ORDER — LIDOCAINE HCL (CARDIAC) 20 MG/ML IV SOLN
INTRAVENOUS | Status: DC | PRN
  Administered 2012-05-23: 50 mg via INTRAVENOUS

## 2012-05-23 MED ORDER — CEFAZOLIN SODIUM 1-5 GM-% IV SOLN
1.0000 g | Freq: Three times a day (TID) | INTRAVENOUS | Status: DC
  Administered 2012-05-23 – 2012-05-25 (×6): 1 g via INTRAVENOUS
  Filled 2012-05-23 (×7): qty 50

## 2012-05-23 MED ORDER — 0.9 % SODIUM CHLORIDE (POUR BTL) OPTIME
TOPICAL | Status: DC | PRN
  Administered 2012-05-23: 1000 mL

## 2012-05-23 MED ORDER — SUCCINYLCHOLINE CHLORIDE 20 MG/ML IJ SOLN
INTRAMUSCULAR | Status: DC | PRN
  Administered 2012-05-23: 100 mg via INTRAVENOUS

## 2012-05-23 MED ORDER — ENSURE COMPLETE PO LIQD
237.0000 mL | Freq: Every day | ORAL | Status: DC
  Administered 2012-05-23 – 2012-05-25 (×6): 237 mL

## 2012-05-23 MED ORDER — BACITRACIN ZINC 500 UNIT/GM EX OINT
1.0000 "application " | TOPICAL_OINTMENT | Freq: Three times a day (TID) | CUTANEOUS | Status: DC
  Administered 2012-05-23 – 2012-05-25 (×6): 1 via TOPICAL
  Filled 2012-05-23: qty 15

## 2012-05-23 MED ORDER — HYDROMORPHONE HCL PF 1 MG/ML IJ SOLN
0.2500 mg | INTRAMUSCULAR | Status: DC | PRN
  Administered 2012-05-23 (×4): 0.5 mg via INTRAVENOUS

## 2012-05-23 MED ORDER — PNEUMOCOCCAL VAC POLYVALENT 25 MCG/0.5ML IJ INJ
0.5000 mL | INJECTION | INTRAMUSCULAR | Status: AC
  Administered 2012-05-24: 0.5 mL via INTRAMUSCULAR
  Filled 2012-05-23: qty 0.5

## 2012-05-23 MED ORDER — BACITRACIN ZINC 500 UNIT/GM EX OINT
TOPICAL_OINTMENT | CUTANEOUS | Status: AC
  Filled 2012-05-23: qty 15

## 2012-05-23 MED ORDER — NEOSTIGMINE METHYLSULFATE 1 MG/ML IJ SOLN
INTRAMUSCULAR | Status: DC | PRN
  Administered 2012-05-23: 2 mg via INTRAVENOUS

## 2012-05-23 MED ORDER — BUPROPION HCL ER (XL) 150 MG PO TB24
150.0000 mg | ORAL_TABLET | Freq: Every day | ORAL | Status: DC
  Administered 2012-05-24 – 2012-05-25 (×2): 150 mg via ORAL
  Filled 2012-05-23 (×3): qty 1

## 2012-05-23 MED ORDER — FENTANYL 50 MCG/HR TD PT72
50.0000 ug | MEDICATED_PATCH | TRANSDERMAL | Status: DC
  Administered 2012-05-23: 50 ug via TRANSDERMAL
  Filled 2012-05-23: qty 1

## 2012-05-23 MED ORDER — ZOLPIDEM TARTRATE 5 MG PO TABS
10.0000 mg | ORAL_TABLET | Freq: Every day | ORAL | Status: DC
  Administered 2012-05-24 (×2): 10 mg via ORAL
  Filled 2012-05-23: qty 1
  Filled 2012-05-23: qty 2
  Filled 2012-05-23: qty 1

## 2012-05-23 MED ORDER — LIDOCAINE-EPINEPHRINE 1 %-1:100000 IJ SOLN
INTRAMUSCULAR | Status: AC
  Filled 2012-05-23: qty 1

## 2012-05-23 MED ORDER — EPINEPHRINE HCL (NASAL) 0.1 % NA SOLN
NASAL | Status: DC | PRN
  Administered 2012-05-23: 30 mL via TOPICAL

## 2012-05-23 MED ORDER — ENSURE COMPLETE PO LIQD: 237.0000 mL | Freq: Three times a day (TID) | ORAL | Status: DC

## 2012-05-23 MED ORDER — ONDANSETRON HCL 4 MG/2ML IJ SOLN: 4.0000 mg | Freq: Four times a day (QID) | INTRAMUSCULAR | Status: DC | PRN

## 2012-05-23 MED ORDER — OXYCODONE-ACETAMINOPHEN 5-325 MG/5ML PO SOLN
5.0000 mL | ORAL | Status: DC | PRN
  Administered 2012-05-23 (×2): 10 mL via ORAL
  Filled 2012-05-23 (×3): qty 10

## 2012-05-23 MED ORDER — EPHEDRINE SULFATE 50 MG/ML IJ SOLN
INTRAMUSCULAR | Status: DC | PRN
  Administered 2012-05-23: 10 mg via INTRAVENOUS

## 2012-05-23 MED ORDER — LACTATED RINGERS IV SOLN
INTRAVENOUS | Status: DC | PRN
  Administered 2012-05-23 (×2): via INTRAVENOUS

## 2012-05-23 MED ORDER — ACETAMINOPHEN 650 MG RE SUPP: 650.0000 mg | RECTAL | Status: DC | PRN

## 2012-05-23 MED ORDER — PENTOXIFYLLINE ER 400 MG PO TBCR
400.0000 mg | EXTENDED_RELEASE_TABLET | Freq: Three times a day (TID) | ORAL | Status: DC
  Administered 2012-05-24 – 2012-05-25 (×4): 400 mg via ORAL
  Filled 2012-05-23 (×8): qty 1

## 2012-05-23 MED ORDER — MIDAZOLAM HCL 5 MG/5ML IJ SOLN
INTRAMUSCULAR | Status: DC | PRN
  Administered 2012-05-23: 1 mg via INTRAVENOUS

## 2012-05-23 MED ORDER — KCL IN DEXTROSE-NACL 20-5-0.45 MEQ/L-%-% IV SOLN
INTRAVENOUS | Status: AC
  Filled 2012-05-23: qty 1000

## 2012-05-23 MED ORDER — GLYCOPYRROLATE 0.2 MG/ML IJ SOLN
INTRAMUSCULAR | Status: DC | PRN
  Administered 2012-05-23: 0.4 mg via INTRAVENOUS

## 2012-05-23 MED ORDER — PROPOFOL 10 MG/ML IV EMUL
INTRAVENOUS | Status: DC | PRN
  Administered 2012-05-23: 150 mg via INTRAVENOUS

## 2012-05-23 MED ORDER — MUPIROCIN 2 % EX OINT
TOPICAL_OINTMENT | CUTANEOUS | Status: AC
  Filled 2012-05-23: qty 22

## 2012-05-23 MED ORDER — ONDANSETRON HCL 4 MG/2ML IJ SOLN: 4.0000 mg | INTRAMUSCULAR | Status: DC | PRN

## 2012-05-23 MED ORDER — ACETAMINOPHEN 160 MG/5ML PO SOLN: 650.0000 mg | ORAL | Status: DC | PRN

## 2012-05-23 MED ORDER — OXYMETAZOLINE HCL 0.05 % NA SOLN
NASAL | Status: AC
  Filled 2012-05-23: qty 15

## 2012-05-23 MED ORDER — ROCURONIUM BROMIDE 100 MG/10ML IV SOLN: INTRAVENOUS | Status: DC | PRN

## 2012-05-23 MED ORDER — PANCRELIPASE (LIP-PROT-AMYL) 12000-38000 UNITS PO CPEP
1.0000 | ORAL_CAPSULE | Freq: Every day | ORAL | Status: DC | PRN
  Filled 2012-05-23: qty 1

## 2012-05-23 MED ORDER — PANCRELIPASE (LIP-PROT-AMYL) 12000-38000 UNITS PO CPEP: 1.0000 | ORAL_CAPSULE | Freq: Four times a day (QID) | ORAL | Status: DC

## 2012-05-23 MED ORDER — ONDANSETRON HCL 4 MG PO TABS: 4.0000 mg | ORAL_TABLET | ORAL | Status: DC | PRN

## 2012-05-23 MED ORDER — OXYCODONE HCL 5 MG/5ML PO SOLN
10.0000 mg | ORAL | Status: DC | PRN
  Administered 2012-05-23 – 2012-05-25 (×6): 10 mg via ORAL
  Filled 2012-05-23: qty 10
  Filled 2012-05-23 (×2): qty 5
  Filled 2012-05-23: qty 10
  Filled 2012-05-23: qty 5
  Filled 2012-05-23: qty 10
  Filled 2012-05-23 (×3): qty 5

## 2012-05-23 SURGICAL SUPPLY — 54 items
ATTRACTOMAT 16X20 MAGNETIC DRP (DRAPES) ×3 IMPLANT
BLADE SURG 15 STRL LF DISP TIS (BLADE) ×2 IMPLANT
BLADE SURG 15 STRL SS (BLADE) ×1
CANISTER SUCTION 2500CC (MISCELLANEOUS) ×3 IMPLANT
CLEANER TIP ELECTROSURG 2X2 (MISCELLANEOUS) ×3 IMPLANT
CLOTH BEACON ORANGE TIMEOUT ST (SAFETY) ×3 IMPLANT
CORDS BIPOLAR (ELECTRODE) ×3 IMPLANT
COVER MAYO STAND STRL (DRAPES) ×3 IMPLANT
COVER SURGICAL LIGHT HANDLE (MISCELLANEOUS) ×3 IMPLANT
COVER TABLE BACK 60X90 (DRAPES) ×3 IMPLANT
DECANTER SPIKE VIAL GLASS SM (MISCELLANEOUS) ×3 IMPLANT
DRAIN SNY 7 FPER (WOUND CARE) ×3 IMPLANT
DRAPE PROXIMA HALF (DRAPES) ×3 IMPLANT
ELECT COATED BLADE 2.86 ST (ELECTRODE) ×3 IMPLANT
ELECT REM PT RETURN 9FT ADLT (ELECTROSURGICAL) ×3
ELECTRODE REM PT RTRN 9FT ADLT (ELECTROSURGICAL) ×2 IMPLANT
EVACUATOR SILICONE 100CC (DRAIN) ×3 IMPLANT
GAUZE SPONGE 4X4 16PLY XRAY LF (GAUZE/BANDAGES/DRESSINGS) ×3 IMPLANT
GLOVE ECLIPSE 6.5 STRL STRAW (GLOVE) ×6 IMPLANT
GLOVE ECLIPSE 7.5 STRL STRAW (GLOVE) ×6 IMPLANT
GLOVE SS BIOGEL STRL SZ 7.5 (GLOVE) ×4 IMPLANT
GLOVE SUPERSENSE BIOGEL SZ 7.5 (GLOVE) ×2
GLOVE SURG SS PI 6.5 STRL IVOR (GLOVE) ×3 IMPLANT
GOWN PREVENTION PLUS XLARGE (GOWN DISPOSABLE) ×6 IMPLANT
GOWN STRL NON-REIN LRG LVL3 (GOWN DISPOSABLE) ×12 IMPLANT
KIT BASIN OR (CUSTOM PROCEDURE TRAY) ×6 IMPLANT
KIT ROOM TURNOVER OR (KITS) ×3 IMPLANT
LOCATOR NERVE 3 VOLT (DISPOSABLE) ×3 IMPLANT
MARKER SKIN DUAL TIP RULER LAB (MISCELLANEOUS) ×3 IMPLANT
NS IRRIG 1000ML POUR BTL (IV SOLUTION) ×3 IMPLANT
PAD ARMBOARD 7.5X6 YLW CONV (MISCELLANEOUS) ×6 IMPLANT
PENCIL BUTTON HOLSTER BLD 10FT (ELECTRODE) ×3 IMPLANT
SPECIMEN JAR MEDIUM (MISCELLANEOUS) ×3 IMPLANT
SPECIMEN JAR SMALL (MISCELLANEOUS) ×3 IMPLANT
SPONGE LAP 18X18 X RAY DECT (DISPOSABLE) ×3 IMPLANT
STAPLER VISISTAT 35W (STAPLE) ×3 IMPLANT
SURGILUBE 2OZ TUBE FLIPTOP (MISCELLANEOUS) ×3 IMPLANT
SUT CHROMIC 3 0 PS 2 (SUTURE) ×9 IMPLANT
SUT CHROMIC GUT 2 0 PS 2 27 (SUTURE) ×6 IMPLANT
SUT ETHILON 5 0 P 3 18 (SUTURE) ×1
SUT ETHILON 5 0 PS 2 18 (SUTURE) ×3 IMPLANT
SUT NYLON ETHILON 5-0 P-3 1X18 (SUTURE) ×2 IMPLANT
SUT SILK 2 0 (SUTURE) ×2
SUT SILK 2 0 FS (SUTURE) ×3 IMPLANT
SUT SILK 2 0 SH CR/8 (SUTURE) ×3 IMPLANT
SUT SILK 2-0 18XBRD TIE 12 (SUTURE) ×4 IMPLANT
SUT SILK 3 0 (SUTURE) ×3
SUT SILK 3-0 18XBRD TIE 12 (SUTURE) ×6 IMPLANT
TOWEL OR 17X24 6PK STRL BLUE (TOWEL DISPOSABLE) ×6 IMPLANT
TOWEL OR 17X26 10 PK STRL BLUE (TOWEL DISPOSABLE) ×3 IMPLANT
TRAY ENT MC OR (CUSTOM PROCEDURE TRAY) ×3 IMPLANT
TRAY FOLEY CATH 14FRSI W/METER (CATHETERS) ×3 IMPLANT
TUBE CONNECTING 12X1/4 (SUCTIONS) ×3 IMPLANT
WATER STERILE IRR 1000ML POUR (IV SOLUTION) ×3 IMPLANT

## 2012-05-23 NOTE — Transfer of Care (Signed)
Immediate Anesthesia Transfer of Care Note  Patient: Russell Williamson  Procedure(s) Performed: Procedure(s) (LRB): DIRECT LARYNGOSCOPY (N/A) NECK MASS BIOPSY (N/A) RADICAL NECK DISSECTION (Left)  Patient Location: PACU  Anesthesia Type: General  Level of Consciousness: awake, alert  and oriented  Airway & Oxygen Therapy: Patient Spontanous Breathing and Patient connected to nasal cannula oxygen  Post-op Assessment: Report given to PACU RN and Post -op Vital signs reviewed and stable  Post vital signs: Reviewed and stable  Complications: No apparent anesthesia complications

## 2012-05-23 NOTE — Anesthesia Procedure Notes (Signed)
Procedure Name: Intubation Date/Time: 05/23/2012 7:57 AM Performed by: Gwenyth Allegra Pre-anesthesia Checklist: Timeout performed, Emergency Drugs available, Suction available, Patient being monitored and Patient identified Patient Re-evaluated:Patient Re-evaluated prior to inductionOxygen Delivery Method: Circle system utilized Preoxygenation: Pre-oxygenation with 100% oxygen Intubation Type: IV induction Ventilation: Mask ventilation without difficulty Grade View: Grade IV Tube type: Oral Tube size: 6.5 mm Number of attempts: 1 Airway Equipment and Method: Stylet and Video-laryngoscopy Placement Confirmation: ETT inserted through vocal cords under direct vision,  breath sounds checked- equal and bilateral and positive ETCO2 Secured at: 21 cm Tube secured with: Tape Dental Injury: Teeth and Oropharynx as per pre-operative assessment  Difficulty Due To: Difficulty was anticipated, Difficult Airway- due to limited oral opening, Difficult Airway- due to immobile epiglottis and Difficult Airway- due to anterior larynx Future Recommendations: Recommend- induction with short-acting agent, and alternative techniques readily available Comments: Status post chemo/radiation for tonsillar cancer.

## 2012-05-23 NOTE — Preoperative (Signed)
Beta Blockers   Reason not to administer Beta Blockers:Not Applicable 

## 2012-05-23 NOTE — Progress Notes (Signed)
Post op check VSS AF Minimal swelling and JP otput Co neck pain Has difficulty swallowing and is dependant on his G tube for nutrition at home Will obtain Ba swallow to evaluate esophagus for stenosis Stable post op course

## 2012-05-23 NOTE — Anesthesia Preprocedure Evaluation (Signed)
Anesthesia Evaluation  Patient identified by MRN, date of birth, ID band Patient awake    Reviewed: Allergy & Precautions, H&P , NPO status , Patient's Chart, lab work & pertinent test results  Airway Mallampati: II  Neck ROM: full    Dental   Pulmonary Current Smoker,          Cardiovascular hypertension,     Neuro/Psych Seizures -,     GI/Hepatic hiatal hernia,   Endo/Other    Renal/GU      Musculoskeletal  (+) Arthritis -,   Abdominal   Peds  Hematology   Anesthesia Other Findings   Reproductive/Obstetrics                           Anesthesia Physical Anesthesia Plan  ASA: III  Anesthesia Plan: General   Post-op Pain Management:    Induction: Intravenous  Airway Management Planned: Oral ETT  Additional Equipment:   Intra-op Plan:   Post-operative Plan: Extubation in OR  Informed Consent: I have reviewed the patients History and Physical, chart, labs and discussed the procedure including the risks, benefits and alternatives for the proposed anesthesia with the patient or authorized representative who has indicated his/her understanding and acceptance.     Plan Discussed with: CRNA and Surgeon  Anesthesia Plan Comments:         Anesthesia Quick Evaluation

## 2012-05-23 NOTE — Interval H&P Note (Signed)
History and Physical Interval Note:  05/23/2012 7:38 AM  Russell Williamson  has presented today for surgery, with the diagnosis of left neck cancer   The various methods of treatment have been discussed with the patient and family. After consideration of risks, benefits and other options for treatment, the patient has consented to  Procedure(s) (LRB): DIRECT LARYNGOSCOPY (N/A) NECK MASS BIOPSY (N/A) RADICAL NECK DISSECTION (Left) as a surgical intervention .  The patient's history has been reviewed, patient examined, no change in status, stable for surgery.  I have reviewed the patient's chart and labs.  Questions were answered to the patient's satisfaction.     NEWMAN, CHRISTOPHER

## 2012-05-23 NOTE — Brief Op Note (Signed)
05/23/2012  11:09 AM  PATIENT:  Russell Williamson  56 y.o. male  PRE-OPERATIVE DIAGNOSIS:  Left neck cancer   POST-OPERATIVE DIAGNOSIS:  Left neck cancer   PROCEDURE:  Procedure(s) (LRB): DIRECT LARYNGOSCOPY (N/A) NECK MASS BIOPSY (N/A) RADICAL NECK DISSECTION (Left)  SURGEON:  Surgeon(s) and Role:    * Sui W Teoh, MD - Assisting    * [name], MD - Primary  PHYSICIAN ASSISTANT:   ASSISTANTS: Avel Sensor   ANESTHESIA:   general  EBL:  Total I/O In: 1300 [I.V.:1300] Out: 270 [Urine:250; Blood:20]  BLOOD ADMINISTERED:none  DRAINS: (perforated) Jackson-Pratt drain(s) with closed bulb suction in the left neck   LOCAL MEDICATIONS USED:  NONE  SPECIMEN:  Source of Specimen:  left neck specimen with long suture on superior jugular vein  DISPOSITION OF SPECIMEN:  PATHOLOGY  COUNTS:  YES  TOURNIQUET:  * No tourniquets in log *  DICTATION: .Other Dictation: Dictation Number [id number]  PLAN OF CARE: Admit to inpatient   PATIENT DISPOSITION:  PACU - hemodynamically stable.   Delay start of Pharmacological VTE agent (>24hrs) due to surgical blood loss or risk of bleeding: yes

## 2012-05-23 NOTE — Progress Notes (Signed)
UR completed 

## 2012-05-23 NOTE — Anesthesia Postprocedure Evaluation (Signed)
Anesthesia Post Note  Patient: Russell Williamson  Procedure(s) Performed: Procedure(s) (LRB): DIRECT LARYNGOSCOPY (N/A) NECK MASS BIOPSY (N/A) RADICAL NECK DISSECTION (Left)  Anesthesia type: General  Patient location: PACU  Post pain: Pain level controlled and Adequate analgesia  Post assessment: Post-op Vital signs reviewed, Patient's Cardiovascular Status Stable, Respiratory Function Stable, Patent Airway and Pain level controlled  Last Vitals:  Filed Vitals:   05/23/12 1130  BP: 129/80  Pulse: 73  Temp:   Resp: 14    Post vital signs: Reviewed and stable  Level of consciousness: awake, alert  and oriented  Complications: No apparent anesthesia complications

## 2012-05-24 ENCOUNTER — Inpatient Hospital Stay (HOSPITAL_COMMUNITY): Payer: Medicaid Other

## 2012-05-24 MED ORDER — MENTHOL 3 MG MT LOZG
1.0000 | LOZENGE | OROMUCOSAL | Status: DC | PRN
  Administered 2012-05-25: 3 mg via ORAL
  Filled 2012-05-24: qty 9

## 2012-05-24 MED FILL — Mupirocin Oint 2%: CUTANEOUS | Qty: 22 | Status: AC

## 2012-05-24 NOTE — Progress Notes (Signed)
INITIAL ADULT NUTRITION ASSESSMENT Date: 05/24/2012   Time: 1:55 PM Reason for Assessment: MST  ASSESSMENT: Male 56 y.o.  Dx: Left neck cancer  Hx:  Past Medical History  Diagnosis Date  . Hypertension   . Chronic pancreatitis   . Arthritis   . Hx of radiation therapy 02/02/11 to 03/22/11    L tonsil  . Seizures     alcohol-related  . Cervical adenopathy 04/26/2012  . Urinary hesitancy   . Constipation   . Cancer 12/15/10    left tonsillar squamous cell carcinoma  . Tonsillar cancer     tonsillar ca dx 12/27/10  . 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 Shortness of breath 05/23/2012    "lying down"  . History of blood transfusion ~ 2004    "from the pancreatitis"  . Daily headache 05/23/2012    "small ones"   Past Surgical History  Procedure Date  . Bile duct stent placement     hx of  . Radical neck dissection 05/23/2012    w/mass excision  . Cholecystectomy 04/2005  . Tibia fracture surgery 1990's    left  . Fracture surgery     Related Meds:  Scheduled Meds:   . bacitracin  1 application Topical Q8H  . buPROPion  150 mg Oral Daily  .  ceFAZolin (ANCEF) IV  1 g Intravenous Q8H  . cholecalciferol  1,000 Units Oral Daily  . dextrose 5 % and 0.45 % NaCl with KCl 20 mEq/L      . feeding supplement  237 mL Per Tube 5 X Daily  . fentaNYL  50 mcg Transdermal Q72H  . lipase/protease/amylase  1 capsule Oral TID WC  . metoprolol  50 mg Oral BID  . mupirocin ointment      . pantoprazole  80 mg Oral Q1200  . pentoxifylline  400 mg Oral TID WC  . pneumococcal 23 valent vaccine  0.5 mL Intramuscular Tomorrow-1000  . zolpidem  10 mg Oral QHS  . DISCONTD: ENSURE  1 Can Oral TID BM  . DISCONTD: feeding supplement  237 mL Oral TID BM  . DISCONTD: lipase/protease/amylase  1 capsule Oral QID   Continuous Infusions:   . dextrose 5 % and 0.45 % NaCl with KCl 20 mEq/L 100 mL/hr at 05/24/12 0309   PRN Meds:.acetaminophen (TYLENOL) oral liquid 160 mg/5 mL,  acetaminophen, lipase/protease/amylase, menthol-cetylpyridinium, morphine injection, ondansetron (ZOFRAN) IV, ondansetron, oxyCODONE, oxyCODONE-acetaminophen, zolpidem  Ht: 5\' 6"  (167.6 cm)  Wt: 124 lb 3.2 oz (56.337 kg)  Ideal Wt: 64.5 kg % Ideal Wt: 87%  Usual Wt:  Wt Readings from Last 10 Encounters:  05/23/12 124 lb 3.2 oz (56.337 kg)  05/23/12 124 lb 3.2 oz (56.337 kg)  05/22/12 124 lb 3.2 oz (56.337 kg)  04/26/12 131 lb 12.8 oz (59.784 kg)  12/24/11 124 lb (56.246 kg)  11/10/11 120 lb 6.4 oz (54.613 kg)  10/27/11 118 lb (53.524 kg)  10/20/11 114 lb 3.2 oz (51.801 kg)  09/17/11 121 lb 4.8 oz (55.021 kg)  08/20/11 125 lb 4.8 oz (56.836 kg)  Usual Wt: 100%  Body mass index is 20.05 kg/(m^2).  Food/Nutrition Related Hx: PEG feedings PTA, some liquids PO PTA  Labs:  CMP     Component Value Date/Time   NA 139 05/22/2012 1031   NA 140 04/24/2012 1200   K 4.5 05/22/2012 1031   K 4.1 04/24/2012 1200   CL 102 05/22/2012 1031   CL 98 04/24/2012 1200  CO2 28 05/22/2012 1031   CO2 30 04/24/2012 1200   GLUCOSE 95 05/22/2012 1031   GLUCOSE 102 04/24/2012 1200   BUN 11 05/22/2012 1031   BUN 13 04/24/2012 1200   CREATININE 0.63 05/22/2012 1031   CREATININE 0.6 04/24/2012 1200   CALCIUM 9.8 05/22/2012 1031   CALCIUM 9.6 04/24/2012 1200   PROT 8.1 04/24/2012 1200   PROT 7.2 10/17/2011 0405   ALBUMIN 3.3* 10/17/2011 0405   AST 30 04/24/2012 1200   AST 18 10/17/2011 0405   ALT 15 10/17/2011 0405   ALKPHOS 77 04/24/2012 1200   ALKPHOS 76 10/17/2011 0405   BILITOT 0.40 04/24/2012 1200   BILITOT 0.4 10/17/2011 0405   GFRNONAA >90 05/22/2012 1031   GFRAA >90 05/22/2012 1031   Intake: 0% Output:   Intake/Output Summary (Last 24 hours) at 05/24/12 1419 Last data filed at 05/24/12 1300  Gross per 24 hour  Intake    100 ml  Output      0 ml  Net    100 ml   No BM since admission  Diet Order: Clear Liquid  Supplements/Tube Feeding:  None at this time  IVF:    dextrose 5 % and 0.45 % NaCl  with KCl 20 mEq/L Last Rate: 100 mL/hr at 05/24/12 0309    Estimated Nutritional Needs:   Kcal: 1800-1970 Protein: 84-101g Fluid: ~1.7 L/day  Pt admitted for re-evaluation of persistent left neck node suspicious for metastatic cancer s/p chemo-radiation treatment for tonsil cancer. Current orders for Ensure Complete 5 times daily via PEG which provides 1750 kcal, 65g protein, 900 mL free water.  Pt is ordered Creon for boluses.  Pt does continuous feeds at night at home- unsure how his Creon is managed for night feeds. Pt had been trying to increase his PO regimen with assistance of outpt [facility] RD (last seen in March 2013).  Pt has been unable to fully wean from TFs due to difficulty swallowing and poor appetite.  Pt states he runs Jevity 1.5 at 90 mL for 7 hrs overnight which provides 945 kcal, 40g protein, 478 mL free water.   Pt states he has also be bolusing Ensure via his PEG during the day. This RD has a difficult time assessing pt's usual intake.  Pt ranges from 'an Ensure/day' to 'a few Ensures/day' and bolusing 3-5 cans of Jevity 1.5.  Intake seems depending on pt's appetite; however does appear that pt is able to tolerate cyclic feeds every night and wt status has improved with continued overnight feeds.  Pt seems to prefer overnight feeds. Pt reports difficulty swallowing- has to tilt head to the right to swallow his pain medication.    NUTRITION DIAGNOSIS: -Inadequate oral intake (NI-2.1).  Status: Ongoing  RELATED TO: neck pain, dysmotility  AS EVIDENCE BY: pt with PEG for nutrition support, pt reports difficulty swallowing.  MONITORING/EVALUATION(Goals): 1.  Food/Beverage; diet per MD discretion and pt tolerance. Pt to consume sips of liquids.  Hopefully for ability to resume drinking Ensure.  EDUCATION NEEDS: -Education needs addressed  INTERVENTION: 1.  Enteral nutrition; Recommend resume Jevity 1.5 @ 90 mL for 14 hrs overnight to provide 1890 kcal, 80g protein, 957 mL  free water.  Once swallowing ability assessed and pt able to safely swallow, would resume PO/bolus via PEG of Ensure Complete TID and reduce duration of cyclic overnight feeds to pt's usual 90 mL for 7 hrs to provide 1995 kcal, 79g protein, 1018 mL free water. 2.  Fluids; pt previously  taking fluids PO.  Pt needs additional 900 mL free water per day.    Dietitian #: [phone]  DOCUMENTATION CODES Per approved criteria  -Underweight    Russell Williamson 05/24/2012, 1:55 PM

## 2012-05-24 NOTE — Op Note (Signed)
Russell Williamson, Russell Williamson NO.:  [id number]  MEDICAL RECORD NO.:  [id number]  LOCATION:  6N31C                        FACILITY:  [facility]  PHYSICIAN:  Kristine Garbe. 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  OPERATION PERFORMED:  Direct laryngoscopy with left radical neck dissection.  SURGEON:  Kristine Garbe. Ezzard Standing, MD  ASSISTANT SURGEON:  Bentli Llorente Pies, MD  ANESTHESIA:  General endotracheal.  COMPLICATIONS:  None.  DRAINS:  Al Pimple with closed bulb suction.  ESTIMATED BLOOD LOSS:  100 mL.  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  He underwent primary treatment with chemoradiation.  He has had done well with this. However, on recent CT scan 1 month ago showed an enlarging and partially necrotic left neck node, which represented a upper level 2 node.  There was another smaller node adjacent to this enlarging node, but no other significant adenopathy and no upper airway abnormalities noted.  The node measured approximately 1.5 cm in size.  This was enlarged slightly between CT scans and appears to represent a metastatic disease.  He was taken to the operating room at this time for direct laryngoscopy and left neck dissection.  DESCRIPTION OF PROCEDURE:  The patient initially underwent direct laryngoscopy.  On direct laryngoscopy, the tonsil region appeared clear. There was no ulcerations.  On palpation, this area was little bit firm from previous chemoradiation, but no significant ulcers or hard induration noted.  Base of tongue was soft to palpation, vallecular area.  Epiglottis was clear on direct  laryngoscopy.  Piriform sinuses were clear bilaterally.  False and true cords and epiglottis were all normal on direct laryngoscopy.  No biopsies were obtained.  Next, the patient was turned.  The left neck was prepped with Betadine solution. The left neck was indurated from his previous radiation and chemotherapy treatment.  Really could not palpate any definitive mass in the neck, but on review of the CT scan, the level of the nodule was just posterior to the submandibular gland just inferior to the angle of the jaw.  A standard neck incision was made.  Subplatysmal flaps were elevated superiorly, posteriorly and inferiorly.  Dissection was first carried out in the submandibular region.  The submandibular gland was preserved. The digastric muscle was identified and followed posteriorly. Dissection was also carried out on the posterior neck and attempts to identify the spinal accessory nerve, but because of the amount of induration, it was difficult to identify the spinal accessory nerve from a posterior approach.  Dissection was then carried out superiorly just below the digastric muscle where the jugular vein was identified.  After identifying the jugular vein, the spinal accessory nerve was identified superiorly adjacent to the jugular vein and this was dissected out down through the sternocleidomastoid muscle until exited posteriorly, inferiorly.  The spinal accessory nerve was preserved throughout the  dissection.  The carotid artery and vagus nerve were identified and preserved.  At this point, we could palpate the enlarged node just posterior and inferior to the submandibular gland.  The superior aspect of the jugular vein was then dissected out, it was ligated and divided with 2-0 silk suture ligature.  The vein was ligated about 2-3 cm above the palpable mass.  The sternocleidomastoid muscle was likewise divided as the spinal accessory nerve was identified and preserved.  The  carotid artery was identified as was the vagus nerve and the ansa cervicalis, which was preserved throughout the dissection.  Dissection was then carried out inferiorly along the carotid artery and jugular vein, this included the mass, the sternocleidomastoid muscle, and the internal jugular vein.  Because of the amount of induration and difficulty in dissection, the inferior aspect of the neck was really not dissected out, this was dissected down to approximate level of the omohyoid muscle.  The specimen was removed and sent to Pathology.  A long suture was left attached to the superior portion of the jugular vein and a short suture on the inferior aspect of the jugular vein.  Prior to resecting the specimen, the internal jugular vein likewise was divided and ligated with 2-0 silk suture and a silk suture ligature.  Hemostasis was obtained with the bipolar cautery and suture ligatures.  The wound was irrigated with saline.  A round perforated Jackson-Pratt drain was brought out through inferior stab wound and secured to the neck with a 5- 0 nylon suture.  The neck defect was then closed with 2-0 and 3-0 chromic sutures subcutaneously and staples on the skin.  The patient was subsequently awakened from anesthesia and transferred to recovery room, postop doing well.  DISPOSITION:  The patient will be admitted for observation.  We will plan on removing the drain and discharge the patient in 2 days.  We will received postoperative Ancef and pain medicine p.r.n.          ______________________________ Kristine Garbe. Ezzard Standing, M.D.     CEN/MEDQ  D:  05/23/2012  T:  05/24/2012  Job:  [id number]  cc:   Radene Gunning, M.D., Ph.D. Exie Parody, M.D. Alsha Meland Pies, MD

## 2012-05-24 NOTE — Progress Notes (Signed)
POD 1 VSS temp 99.5 JP drain with chyle 80cc Neck without swelling Complains of sore throat and neck Ba swallow shows small ring or narrowing at the upper esoph that's non obstructing Stable post op course with increase JP output but no recording noted since yesterday.

## 2012-05-25 ENCOUNTER — Encounter (HOSPITAL_COMMUNITY): Payer: Self-pay | Admitting: Otolaryngology

## 2012-05-25 MED ORDER — OXYCODONE HCL 5 MG PO TABS: 10.0000 mg | ORAL_TABLET | ORAL | Status: DC | PRN

## 2012-05-25 MED ORDER — BACITRACIN ZINC 500 UNIT/GM EX OINT: 1.0000 "application " | TOPICAL_OINTMENT | Freq: Three times a day (TID) | CUTANEOUS | Status: AC

## 2012-05-25 MED ORDER — CEPHALEXIN 500 MG PO CAPS: 500.0000 mg | ORAL_CAPSULE | Freq: Two times a day (BID) | ORAL | Status: AC

## 2012-05-25 MED ORDER — CHLORHEXIDINE GLUCONATE CLOTH 2 % EX PADS: 6.0000 | MEDICATED_PAD | Freq: Every day | CUTANEOUS | Status: DC

## 2012-05-25 MED ORDER — MUPIROCIN 2 % EX OINT
1.0000 "application " | TOPICAL_OINTMENT | Freq: Two times a day (BID) | CUTANEOUS | Status: DC
  Administered 2012-05-25: 1 via NASAL
  Filled 2012-05-25: qty 22

## 2012-05-25 NOTE — Progress Notes (Signed)
Patient discharged to home with instructions, verbalized understanding. 

## 2012-05-25 NOTE — Progress Notes (Signed)
POD 2 VSS  Temp 99.5 JP drain 10cc last shift with minimal drainage in bulb. This was removed. Patient with chronic complaint of pancreatitis pain and new L neck pain from surgery. Incision with minimal swelling. Final path report showed 2 of 7 lymph nodes + for scca Patient ready for discharge. Has his meds at home. Will add antibiotic Keflex. Will have pat. follow up with me next week.

## 2012-05-25 NOTE — Discharge Summary (Signed)
Discharge note dictated  [phone]

## 2012-05-26 NOTE — Discharge Summary (Signed)
NAMEAVRAHAM, Russell Williamson NO.:  [id number]  MEDICAL RECORD NO.:  [id number]  LOCATION:  6N31C                        FACILITY:  [facility]  PHYSICIAN:  Kristine Garbe. Ezzard Standing, M.D.DATE OF BIRTH:  10-Oct-1955  DATE OF ADMISSION:  05/23/2012 DATE OF DISCHARGE:  05/25/2012                              DISCHARGE SUMMARY   DISCHARGE DIAGNOSES: 1. 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 2. History of left tonsil cancer status post treatment with chemo     radiation. 3. History of acute pancreatitis and chronic pancreatitis. 4. Hypertension.  OPERATION/PROCEDURES DURING THIS HOSPITALIZATION:  Direct laryngoscopy with left radical neck dissection on May 23, 2012.  HOSPITAL COURSE:  The patient was admitted via the operating room on May 23, 2012, at which time he underwent a direct laryngoscopy and left radical neck dissection.  Postoperatively, he was admitted to the surgical unit via [facility] on IV antibiotic Ancef 1 g IV q.8 hours along with IV fluids and feedings through his gastrostomy tube as well as p.o. intake.  He had a JP drain that was monitored.  He remained afebrile, although he did have a low-grade temperature of 99.5.  His wound was healing well and the JP output decreased to 10 mL per shift and was subsequently removed on his second postoperative day.  He was still complaining of some left neck pain as well as pain with p.o. intake and pain with his pancreatitis.  He was tolerating gastrostomy tube feedings okay.  He had no bleeding and no significant swelling of the neck.  The JP drain was removed on his second postoperative day, and the patient was subsequently discharged home.  The patient's discharge medications included all of his admission medications along with Keflex 500 mg b.i.d. for 5 days and bacitracin ointment to apply to his incision site daily.  We will have him follow up in my office next week to have his staples removed.  His  final pathology report revealed 2 of 7 lymph nodes positive for metastatic squamous cell carcinoma.          ______________________________ Kristine Garbe. Ezzard Standing, M.D.     CEN/MEDQ  D:  05/25/2012  T:  05/26/2012  Job:  [id number]

## 2012-05-29 ENCOUNTER — Encounter (HOSPITAL_COMMUNITY): Payer: Self-pay

## 2012-06-06 ENCOUNTER — Encounter (HOSPITAL_BASED_OUTPATIENT_CLINIC_OR_DEPARTMENT_OTHER): Payer: Medicaid Other | Attending: General Surgery

## 2012-06-06 DIAGNOSIS — Z85819 Personal history of malignant neoplasm of unspecified site of lip, oral cavity, and pharynx: Secondary | ICD-10-CM | POA: Insufficient documentation

## 2012-06-06 DIAGNOSIS — T8189XA Other complications of procedures, not elsewhere classified, initial encounter: Secondary | ICD-10-CM | POA: Insufficient documentation

## 2012-06-06 DIAGNOSIS — Y842 Radiological procedure and radiotherapy as the cause of abnormal reaction of the patient, or of later complication, without mention of misadventure at the time of the procedure: Secondary | ICD-10-CM | POA: Insufficient documentation

## 2012-06-06 DIAGNOSIS — Y838 Other surgical procedures as the cause of abnormal reaction of the patient, or of later complication, without mention of misadventure at the time of the procedure: Secondary | ICD-10-CM | POA: Insufficient documentation

## 2012-06-06 DIAGNOSIS — T66XXXS Radiation sickness, unspecified, sequela: Secondary | ICD-10-CM | POA: Insufficient documentation

## 2012-06-15 ENCOUNTER — Other Ambulatory Visit (HOSPITAL_COMMUNITY): Payer: Self-pay | Admitting: Dentistry

## 2012-06-15 MED ORDER — CHLORHEXIDINE GLUCONATE 0.12 % MT SOLN: OROMUCOSAL | Status: AC

## 2012-06-23 ENCOUNTER — Ambulatory Visit: Admission: RE | Admit: 2012-06-23 | Payer: Medicaid Other | Source: Ambulatory Visit | Admitting: Radiation Oncology

## 2012-06-27 ENCOUNTER — Encounter (HOSPITAL_COMMUNITY): Payer: Self-pay | Admitting: Dentistry

## 2012-06-27 ENCOUNTER — Ambulatory Visit (HOSPITAL_COMMUNITY): Payer: Medicaid - Dental | Admitting: Dentistry

## 2012-06-27 VITALS — BP 112/55 | HR 54 | Temp 98.8°F

## 2012-06-27 DIAGNOSIS — K08109 Complete loss of teeth, unspecified cause, unspecified class: Secondary | ICD-10-CM

## 2012-06-27 DIAGNOSIS — K1370 Unspecified lesions of oral mucosa: Secondary | ICD-10-CM

## 2012-06-27 DIAGNOSIS — K117 Disturbances of salivary secretion: Secondary | ICD-10-CM

## 2012-06-27 DIAGNOSIS — Z463 Encounter for fitting and adjustment of dental prosthetic device: Secondary | ICD-10-CM

## 2012-06-27 DIAGNOSIS — M272 Inflammatory conditions of jaws: Secondary | ICD-10-CM

## 2012-06-27 NOTE — Patient Instructions (Signed)
Instructions for Denture Use and Care  Congratulations, you are on the way to oral rehabilitation!  You have just received a new set of complete or partial dentures.  These prostheses will help to improve both your appearance and chewing ability.  These instructions will help you get adjusted to your dentures as well as care for them properly.  Please read these instructions carefully and completely as soon as you get home.  If you or your caregiver have any questions please notify the [facility] at [phone].  HOW YOUR DENTURES LOOK AND FEEL Soon after you begin wearing your dentures, you may feel that your dentures are too large or even loose.  As our mouth and facial muscles become accustomed to the dentures, these feelings will go away.  You also may feel that you are salivating more than you normally do.  This feeling should go away as you get used to having the dentures in your mouth.  You may bite your cheek or your tongue; this will eventually resolve itself as you wear your dentures.  Some soreness is to be expected, but you should not hurt.  If your mouth hurts, call your dentist.  A denture adhesive may occasionally be necessary to hold your dentures in place more securely.  The dentist will let you know when one is recommended for you.  SPEAKING Wearing dentures will change the sound of your voice initially.  This will be noticed by you more than anyone else.  Bite and swallow before you speak, in order to place your dentures in position so that you may speak more clearly.  Practice speaking by reading aloud or counting from 1 to 100 very slowly and distinctly.  After some practice your mouth will become accustomed to your dentures and you will speak more clearly.  EATING Chewing will definitely be different after you receive your dentures.  With a little practice and patience you should be able to eat just about any kind of food.  Begin by eating small quantities of food  that are cut into small pieces.  Star with soft foods such as eggs, cooked vegetables, or puddings.  As you gain confidence advance  Your diet to whatever texture foods you can tolerate.  DENTURE CARE Dentures can collect plaque and calculus much the same as natural teeth can.  If not removed on a regular basis, your dentures will not look or feel clean, and you will experience denture odor.  It is very important that you remove your dentures at bedtime and clean them thoroughly.  You should: 1. Clean your dentures over a sink full of water so if dropped, breakage will be prevented. 2. Rinse your dentures with cool water to remove any large food particles. 3. Use soap and water or a denture cleanser or paste to clean the dentures.  Do not use regular toothpaste as it may abrade the denture base or teeth. 4. Use a moistened denture brush to clean all surfaces (inside and outside). 5. Rinse thoroughly to remove any remaining soap or denture cleanser. 6. Use a soft bristle toothbrush to gently brush any natural teeth, gums, tongue, and palate at bedtime and before reinserting your dentures. 7. Do not sleep with your dentures in your mouth at night.  Remove your dentures and soak them overnight in a denture cup filled with water or denture solution as recommended by your dentist.  This routine will become second nature and will increase the life and comfort   of your dentures.  Please do not try to adjust these dentures yourself; you could damage them.  FOLLOW-UP You should call or make an appointment with your dentist.  Your dentist would like to see you at least once a year for a check-up and examination. 

## 2012-06-27 NOTE — Progress Notes (Signed)
06/27/2012   Patient:            Russell Williamson Date of Birth:  1956/01/08 MRN:                [id number]  BP 112/55  Pulse 54  Temp 98.8 F (37.1 C) (Oral)  Patient Active Problem List  Diagnosis  . Chronic pancreatitis  . Malignant neoplasm of tonsil  . Acute pancreatitis  . Tonsillar cancer  . HTN (hypertension)  . Hiatal hernia  . Hx of radiation therapy  . Cancer  . Cervical adenopathy    Past Medical History  Diagnosis Date  . Hypertension   . Chronic pancreatitis   . Arthritis   . Hx of radiation therapy 02/02/11 to 03/22/11    L tonsil  . Seizures     alcohol-related  . Cervical adenopathy 04/26/2012  . Urinary hesitancy   . Constipation   . Cancer 12/15/10    left tonsillar squamous cell carcinoma  . Tonsillar cancer     tonsillar ca dx 12/27/10  . 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 Shortness of breath 05/23/2012    "lying down"  . History of blood transfusion ~ 2004    "from the pancreatitis"  . Daily headache 05/23/2012    "small ones"  No Known Allergies  Current Outpatient Prescriptions  Medication Sig Dispense Refill  . buPROPion (WELLBUTRIN XL) 150 MG 24 hr tablet Take 150 mg by mouth daily.      . chlorhexidine (PERIDEX) 0.12 % solution Rinse with 15 MLS for 30 seconds 3 times daily after meals and at bedtime  480 mL  PRN  . Cholecalciferol (VITAMIN D PO) Take 1 tablet by mouth daily.      . fentaNYL (DURAGESIC - DOSED MCG/HR) 50 MCG/HR Place 1 patch onto the skin every 3 (three) days.      Marland Kitchen lipase/protease/amylase (CREON) 12000 UNITS CPEP Take 1 capsule by mouth 4 (four) times daily.      . metoprolol (LOPRESSOR) 50 MG tablet Take 50 mg by mouth 2 (two) times daily.      . Nutritional Supplements (ENSURE PO) Take 1 Can by mouth 3 (three) times daily.      . Nutritional Supplements (OSMOLITE 1.5 CAL PO) Give 7 Cans by tube See admin instructions. 1 can=8 oz.  7 cans throughout the day      . Omega-3 Fatty Acids (FISH OIL PO) Take 1  capsule by mouth daily.      Marland Kitchen omeprazole (PRILOSEC) 40 MG capsule Take 40 mg by mouth daily.       Marland Kitchen oxyCODONE (ROXICODONE) 15 MG immediate release tablet Take 15 mg by mouth every 4 (four) hours.       . pentoxifylline (TRENTAL) 400 MG CR tablet Take 400 mg by mouth 3 (three) times daily with meals.      . promethazine (PHENERGAN) 25 MG tablet Take 25 mg by mouth every 6 (six) hours as needed. For nausea      . zolpidem (AMBIEN) 10 MG tablet Take 10 mg by mouth at bedtime.        [name] presents for re-evaluation of exposed bone in the maxillary left quadrant area 15-16 as well as mandibular right lingaul alveolar ridge area #31. Patient also brought dentures for evaluation of upper lower complete dentures for possible adjustments as needed.  Subjective: Patient indicates the exposed bone is no longer present. Patient has been unable to wear dentures  adequately however. Patient has not been wearing dentures "tha much" by report.  Patient is still getting daily hyperbaric oxygen therapy by report. Patient is still smoking "one to two cigarettes a day" by report. Patient encouraged again to stop smoking.   Patient is still using chlorhexidine rinses 3 times daily as prescribed. Rx refilled on 06/19/12. Patient indicates that he recently had surgery with Dr. Narda Bonds for left neck persistence. Patient had neck dissection with Dr. Ezzard Standing in august of 2013.  Exam: Patient is edentulous with less erythema noted after HBO therapy.  Patient has persistent xerostomia . Patient has NO EVIDENCE of exposed bone. Prosthodontic: Upper and lower complete dentures have pressure indicating paste applied and dentures were adjusted significantly today. Dentures were polished. Occlusion was evaluated for maximum intercuspation and dentures were adjusted dose centric relation and protrusive strokes. Patient with tendency to protrude lower jaw to an and 2 and position. Patient instructed on how to  obtain maximum intercuspation position. Patient accepts results of the dentures adjustments today. We discussed future upper and lower complete denture lab reline procedures once prior approval from Medicaid has been obtained. Patient does wish to proceed with this plan.  Assessments: 1. 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 2. Edentulous 3. Severe xerostomia-radiation induced. 4. History of generalized erythema of oral soft tissues. This has resolved almost completely with the hyperbaric oxygen therapy.   Plan: 1. Patient to use chlorhexidine rinses 3 times daily as instructed.  2. Patient also instructed to again try to stop smoking. 3. Return as scheduled for denture adjustment appointment. Call if problems arise before then. Keep dentures out if sore spots arise. Patient also may use salt water rinses as needed. 4. Will obtain prior approval for F/F denture relines from Medicaid. Patient dismissed in stable condition.  Dr. Cindra Eves

## 2012-06-27 NOTE — Progress Notes (Signed)
[facility]  NAME:  Russell Williamson, Russell Williamson            ACCOUNT NO.:  [id number]  MEDICAL RECORD NO.:  [id number]      DATE OF BIRTH:  July 06, 1956  PHYSICIAN:  Joanne Gavel, M.D.         VISIT DATE:  06/27/2012                                  OFFICE VISIT   ADDRESS:  BODY:  To Whom It May Concern:  [name] was treated with radiation and chemotherapy for cancer of the left tonsil.  He developed metastatic disease in the left neck and underwent in August 2013 a radical neck dissection, 2/7 lymph nodes were found to be positive.  The radical neck wound has failed to heal.  There is a 3 cm x 3 cm x 1 cm deep defect in the midst of some very rigid, obviously radiation injured tissue.  I have discussed this case in depth with the otolaryngologist, Dr. Narda Bonds, and are both in agreement that this represents failure of the flap made in the radical neck dissection to heal and that hyperbaric treatment is clearly indicated.     Joanne Gavel, M.D.     RA/MEDQ  D:  06/27/2012  T:  06/27/2012  Job:  [id number]

## 2012-06-30 ENCOUNTER — Ambulatory Visit
Admission: RE | Admit: 2012-06-30 | Discharge: 2012-06-30 | Disposition: A | Discharge status: Home / Self Care | Payer: Medicaid Other | Source: Ambulatory Visit | Attending: Radiation Oncology | Admitting: Radiation Oncology

## 2012-06-30 ENCOUNTER — Encounter: Payer: Self-pay | Admitting: Radiation Oncology

## 2012-06-30 VITALS — BP 121/70 | HR 52 | Temp 97.1°F | Resp 18 | Wt 118.1 lb

## 2012-06-30 DIAGNOSIS — C099 Malignant neoplasm of tonsil, unspecified: Secondary | ICD-10-CM

## 2012-06-30 NOTE — Progress Notes (Signed)
Radiation Oncology         [phone] ________________________________  Name: Bazil Dhanani MRN: [id number]  Date: 06/30/2012  DOB: [birth year]  Follow-Up Visit Note  CC: [name], MD  [name], *  Diagnosis:   Squamous cell carcinoma of the left tonsil  Narrative:  The patient returns today for routine follow-up.  Patient returns to clinic today for followup. He had completed his course of radiotherapy in June of 2012. The patient was noted on imaging since he was last seen to have an interval increase in the size of a left level II lymph node that. This measured 15 mm in maximum dimension. The patient therefore proceeded with a left neck dissection with Dr. Ezzard Standing. Final pathology revealed 2/7 lymph nodes were positive for cancer. The patient continues to heal up after this. He has a small area which he continues to pack and he has been working with Dr. Ezzard Standing to prevent infection and to promote wound healing. He states that this has been healing, albeit slowly.  The patient states that he is not taking in much by mouth at this point. He is using both an Ensure and Osmolite. The patient has some nausea which he is treating with Phenergan. He does have both long and short acting pain medicine.                              ALLERGIES:   has no known allergies.  Meds: Current Outpatient Prescriptions  Medication Sig Dispense Refill  . chlorhexidine (PERIDEX) 0.12 % solution Rinse with 15 MLS for 30 seconds 3 times daily after meals and at bedtime  480 mL  PRN  . Cholecalciferol (VITAMIN D PO) Take 1 tablet by mouth daily.      . fentaNYL (DURAGESIC - DOSED MCG/HR) 50 MCG/HR Place 1 patch onto the skin every 3 (three) days.      Marland Kitchen lipase/protease/amylase (CREON) 12000 UNITS CPEP Take 1 capsule by mouth 4 (four) times daily.      . metoprolol (LOPRESSOR) 50 MG tablet Take 50 mg by mouth 2 (two) times daily.      . Nutritional Supplements (ENSURE PO) Take 1 Can by  mouth 3 (three) times daily.      . Nutritional Supplements (OSMOLITE 1.5 CAL PO) Give 7 Cans by tube See admin instructions. 1 can=8 oz.  7 cans throughout the day      . Omega-3 Fatty Acids (FISH OIL PO) Take 1 capsule by mouth daily.      Marland Kitchen omeprazole (PRILOSEC) 40 MG capsule Take 40 mg by mouth daily.       Marland Kitchen oxyCODONE (ROXICODONE) 15 MG immediate release tablet Take 15 mg by mouth every 4 (four) hours.       . promethazine (PHENERGAN) 25 MG tablet Take 25 mg by mouth every 6 (six) hours as needed. For nausea      . zolpidem (AMBIEN) 10 MG tablet Take 10 mg by mouth at bedtime.       Marland Kitchen buPROPion (WELLBUTRIN XL) 150 MG 24 hr tablet Take 150 mg by mouth daily.      . pentoxifylline (TRENTAL) 400 MG CR tablet Take 400 mg by mouth 3 (three) times daily with meals.        Physical Findings: The patient is in no acute distress. Patient is alert and oriented.  weight is 118 lb 1.6 oz (53.57 kg). His oral temperature is 97.1  F (36.2 C). His blood pressure is 121/70 and his pulse is 52. His respiration is 18. .   General: Well-developed, in no acute distress HEENT: Normocephalic, atraumatic Neck: Right neck without palpable lymphadenopathy. The left neck was bandaged and Actiq a look at the small residual incision which continues to heal. This is packed and shows no sign of active infection. Cardiovascular: Regular rate and rhythm Respiratory: Clear to auscultation bilaterally GI: Soft, nontender, normal bowel sounds Extremities: No edema present  Lab Findings: Lab Results  Component Value Date   WBC 3.6* 05/22/2012   HGB 12.8* 05/22/2012   HCT 38.9* 05/22/2012   MCV 102.6* 05/22/2012   PLT 137* 05/22/2012     Radiographic Findings: No results found.  Impression:    56 year old male status post chemoradiotherapy and subsequent left neck dissection for squamous cell carcinoma of the left tonsil. Originally T2 N2. 2/7 lymph nodes were positive on his recent dissection completed  approximately one month ago. The patient continues to heal from this.  Plan:  I will have him followup in our clinic in 3 months.  I spent 10 minutes with the patient today, the majority of which was spent counseling the patient on the diagnosis of cancer and coordinating care.   Radene Gunning, M.D., Ph.D.

## 2012-06-30 NOTE — Progress Notes (Addendum)
Patient presented to the clinic today unaccompanied for a follow up appointment with Dr. Mitzi Hansen. Patient is alert and oriented to person, place, and time. No distress noted. Steady gait noted. Pleasant affect noted. Patient reports constant aching and occasional throbbing pain 6 on a scale of 0-10 that radiates to behind his left ear despite fentanyl patch and oxycodone. Patient reports nausea persistent without emesis. Patient reports taking Phenergan for nausea. Patient reports that during the day he put two cans of ensure per day and sets his osmolite to the pump at 11 pm but, it alarms by 3 am and he has to cut it off. Patient continues to smoke three blue electric cigarettes per day. Patient reports thick sticky saliva and dry mouth. Patient reports eating very little by mouth. Left neck noted with dry intact gauze. Patient reports incision from neck dissection in August has yet to heal. Reported all the findings to Dr. Mitzi Hansen.

## 2012-07-04 ENCOUNTER — Encounter (HOSPITAL_BASED_OUTPATIENT_CLINIC_OR_DEPARTMENT_OTHER): Payer: Medicaid Other | Attending: General Surgery

## 2012-07-04 DIAGNOSIS — Y838 Other surgical procedures as the cause of abnormal reaction of the patient, or of later complication, without mention of misadventure at the time of the procedure: Secondary | ICD-10-CM | POA: Insufficient documentation

## 2012-07-04 DIAGNOSIS — Z85819 Personal history of malignant neoplasm of unspecified site of lip, oral cavity, and pharynx: Secondary | ICD-10-CM | POA: Insufficient documentation

## 2012-07-04 DIAGNOSIS — T66XXXS Radiation sickness, unspecified, sequela: Secondary | ICD-10-CM | POA: Insufficient documentation

## 2012-07-04 DIAGNOSIS — Y842 Radiological procedure and radiotherapy as the cause of abnormal reaction of the patient, or of later complication, without mention of misadventure at the time of the procedure: Secondary | ICD-10-CM | POA: Insufficient documentation

## 2012-07-04 DIAGNOSIS — T8189XA Other complications of procedures, not elsewhere classified, initial encounter: Secondary | ICD-10-CM | POA: Insufficient documentation

## 2012-07-17 ENCOUNTER — Other Ambulatory Visit: Payer: Self-pay | Admitting: Oncology

## 2012-07-17 DIAGNOSIS — C099 Malignant neoplasm of tonsil, unspecified: Secondary | ICD-10-CM

## 2012-07-17 MED ORDER — MAGIC MOUTHWASH W/LIDOCAINE: 5.0000 mL | Freq: Four times a day (QID) | ORAL | Status: DC | PRN

## 2012-07-19 ENCOUNTER — Other Ambulatory Visit: Payer: Self-pay | Admitting: Oncology

## 2012-07-19 NOTE — Telephone Encounter (Signed)
Request for viscous lidocaine 2%.  Refill called in and I notified Dorothy.

## 2012-08-08 ENCOUNTER — Encounter (HOSPITAL_BASED_OUTPATIENT_CLINIC_OR_DEPARTMENT_OTHER): Payer: Medicaid Other | Attending: General Surgery

## 2012-08-08 DIAGNOSIS — T8189XA Other complications of procedures, not elsewhere classified, initial encounter: Secondary | ICD-10-CM | POA: Insufficient documentation

## 2012-08-08 DIAGNOSIS — Y838 Other surgical procedures as the cause of abnormal reaction of the patient, or of later complication, without mention of misadventure at the time of the procedure: Secondary | ICD-10-CM | POA: Insufficient documentation

## 2012-08-08 DIAGNOSIS — Y842 Radiological procedure and radiotherapy as the cause of abnormal reaction of the patient, or of later complication, without mention of misadventure at the time of the procedure: Secondary | ICD-10-CM | POA: Insufficient documentation

## 2012-08-08 DIAGNOSIS — M278 Other specified diseases of jaws: Secondary | ICD-10-CM | POA: Insufficient documentation

## 2012-08-11 ENCOUNTER — Other Ambulatory Visit: Payer: Self-pay | Admitting: Otolaryngology

## 2012-08-11 DIAGNOSIS — R1310 Dysphagia, unspecified: Secondary | ICD-10-CM

## 2012-08-14 ENCOUNTER — Encounter (HOSPITAL_COMMUNITY): Payer: Self-pay | Admitting: Dentistry

## 2012-08-14 ENCOUNTER — Ambulatory Visit (HOSPITAL_COMMUNITY): Payer: Medicaid - Dental | Admitting: Dentistry

## 2012-08-14 VITALS — BP 117/73 | HR 63 | Temp 98.2°F

## 2012-08-14 DIAGNOSIS — R252 Cramp and spasm: Secondary | ICD-10-CM

## 2012-08-14 DIAGNOSIS — K0889 Other specified disorders of teeth and supporting structures: Secondary | ICD-10-CM

## 2012-08-14 DIAGNOSIS — K117 Disturbances of salivary secretion: Secondary | ICD-10-CM

## 2012-08-14 DIAGNOSIS — Z463 Encounter for fitting and adjustment of dental prosthetic device: Secondary | ICD-10-CM

## 2012-08-14 DIAGNOSIS — K08109 Complete loss of teeth, unspecified cause, unspecified class: Secondary | ICD-10-CM

## 2012-08-14 NOTE — Progress Notes (Signed)
08/14/2012  Patient:            Russell Williamson Date of Birth:  1956-07-08 MRN:                [id number]  BP 117/73  Pulse 63  Temp 98.2 F (36.8 C) (Oral)   [name] presents for upper and lower complete denture reline procedures. Procedure: Upper and lower border molding and final impressions in Isofunctional compound. 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 Patient tolerated procedure well. To [facility] for lab relines. Return to clinic for upper and lower complete denture reline insertions.  Charlynne Pander, DDS

## 2012-08-14 NOTE — Patient Instructions (Addendum)
Return to clinic for insertion of denture relines as scheduled

## 2012-08-16 ENCOUNTER — Encounter (HOSPITAL_COMMUNITY): Payer: Self-pay | Admitting: Dentistry

## 2012-08-16 ENCOUNTER — Ambulatory Visit (HOSPITAL_COMMUNITY): Payer: Medicaid - Dental | Admitting: Dentistry

## 2012-08-16 VITALS — BP 128/72 | HR 59 | Temp 98.1°F

## 2012-08-16 DIAGNOSIS — K117 Disturbances of salivary secretion: Secondary | ICD-10-CM

## 2012-08-16 DIAGNOSIS — K0820 Unspecified atrophy of edentulous alveolar ridge: Secondary | ICD-10-CM

## 2012-08-16 DIAGNOSIS — Z463 Encounter for fitting and adjustment of dental prosthetic device: Secondary | ICD-10-CM

## 2012-08-16 DIAGNOSIS — K0889 Other specified disorders of teeth and supporting structures: Secondary | ICD-10-CM

## 2012-08-16 NOTE — Progress Notes (Signed)
08/16/2012  Patient:            Russell Williamson Date of Birth:  [birth year] MRN:                [id number]   BP 128/72  Pulse 59  Temp 98.1 F (36.7 C) (Oral)  Russell Williamson presents for insertion of upper and lower complete denture relines. Procedure: Pressure indicating paste applied to dentures. Adjustments made as needed. [name]. Occlusion evaluated and adjustments made as needed for Centric Relation and protrusive strokes. Patient accepts results. Post op instructions provided in written and verbal formats on use and care of dentures. Gave patient denture brush and cup. Patient to keep dentures out if sore spots develop. Use salt water rinses as needed to aid healing. Return to clinic as scheduled for denture adjustment.  Call if problems arise before then. Patient dismissed in stable condition.  Charlynne Pander, DDS

## 2012-08-16 NOTE — Patient Instructions (Signed)
Instructions for Denture Use and Care  Congratulations, you are on the way to oral rehabilitation!  You have just received a new set of complete or partial dentures.  These prostheses will help to improve both your appearance and chewing ability.  These instructions will help you get adjusted to your dentures as well as care for them properly.  Please read these instructions carefully and completely as soon as you get home.  If you or your caregiver have any questions please notify the [facility] at [phone].  HOW YOUR DENTURES LOOK AND FEEL Soon after you begin wearing your dentures, you may feel that your dentures are too large or even loose.  As our mouth and facial muscles become accustomed to the dentures, these feelings will go away.  You also may feel that you are salivating more than you normally do.  This feeling should go away as you get used to having the dentures in your mouth.  You may bite your cheek or your tongue; this will eventually resolve itself as you wear your dentures.  Some soreness is to be expected, but you should not hurt.  If your mouth hurts, call your dentist.  A denture adhesive may occasionally be necessary to hold your dentures in place more securely.  The dentist will let you know when one is recommended for you.  SPEAKING Wearing dentures will change the sound of your voice initially.  This will be noticed by you more than anyone else.  Bite and swallow before you speak, in order to place your dentures in position so that you may speak more clearly.  Practice speaking by reading aloud or counting from 1 to 100 very slowly and distinctly.  After some practice your mouth will become accustomed to your dentures and you will speak more clearly.  EATING Chewing will definitely be different after you receive your dentures.  With a little practice and patience you should be able to eat just about any kind of food.  Begin by eating small quantities of food  that are cut into small pieces.  Star with soft foods such as eggs, cooked vegetables, or puddings.  As you gain confidence advance  Your diet to whatever texture foods you can tolerate.  DENTURE CARE Dentures can collect plaque and calculus much the same as natural teeth can.  If not removed on a regular basis, your dentures will not look or feel clean, and you will experience denture odor.  It is very important that you remove your dentures at bedtime and clean them thoroughly.  You should: 1. Clean your dentures over a sink full of water so if dropped, breakage will be prevented. 2. Rinse your dentures with cool water to remove any large food particles. 3. Use soap and water or a denture cleanser or paste to clean the dentures.  Do not use regular toothpaste as it may abrade the denture base or teeth. 4. Use a moistened denture brush to clean all surfaces (inside and outside). 5. Rinse thoroughly to remove any remaining soap or denture cleanser. 6. Use a soft bristle toothbrush to gently brush any natural teeth, gums, tongue, and palate at bedtime and before reinserting your dentures. 7. Do not sleep with your dentures in your mouth at night.  Remove your dentures and soak them overnight in a denture cup filled with water or denture solution as recommended by your dentist.  This routine will become second nature and will increase the life and comfort   of your dentures.  Please do not try to adjust these dentures yourself; you could damage them.  FOLLOW-UP You should call or make an appointment with your dentist.  Your dentist would like to see you at least once a year for a check-up and examination. 

## 2012-08-21 ENCOUNTER — Ambulatory Visit
Admission: RE | Admit: 2012-08-21 | Discharge: 2012-08-21 | Disposition: A | Discharge status: Home / Self Care | Payer: Medicaid Other | Source: Ambulatory Visit | Attending: Otolaryngology | Admitting: Otolaryngology

## 2012-08-21 DIAGNOSIS — R1310 Dysphagia, unspecified: Secondary | ICD-10-CM

## 2012-08-22 ENCOUNTER — Ambulatory Visit (HOSPITAL_COMMUNITY): Payer: Self-pay | Admitting: Dentistry

## 2012-08-22 ENCOUNTER — Encounter (HOSPITAL_COMMUNITY): Payer: Self-pay | Admitting: Dentistry

## 2012-08-22 VITALS — BP 123/73 | HR 54 | Temp 98.2°F

## 2012-08-22 DIAGNOSIS — M264 Malocclusion, unspecified: Secondary | ICD-10-CM

## 2012-08-22 DIAGNOSIS — K1370 Unspecified lesions of oral mucosa: Secondary | ICD-10-CM

## 2012-08-22 DIAGNOSIS — Z463 Encounter for fitting and adjustment of dental prosthetic device: Secondary | ICD-10-CM

## 2012-08-22 DIAGNOSIS — K08109 Complete loss of teeth, unspecified cause, unspecified class: Secondary | ICD-10-CM

## 2012-08-22 DIAGNOSIS — K062 Gingival and edentulous alveolar ridge lesions associated with trauma: Secondary | ICD-10-CM

## 2012-08-22 DIAGNOSIS — R252 Cramp and spasm: Secondary | ICD-10-CM

## 2012-08-22 NOTE — Progress Notes (Signed)
08/22/2012  Patient:            Russell Williamson Date of Birth:  [birth year] MRN:                [id number]   BP 123/73  Pulse 54  Temp 98.2 F (36.8 C) (Oral)  [name] presents for evaluation of recently inserted upper lower complete denture relines. Subjective: Patient is complaining of minor irritation to several areas of lower denture. Exam: There is no evidence of denture irritation or erythema. Procedure: Pressure indicating paste applied to dentures. Adjustments made as needed. [name]. Occlusion evaluated and adjustments made as needed for Centric Relation and protrusive strokes. Patient still with tendency to protrude to end to end position. Patient also is complaining of difficulty in getting upper and lower dentures in. 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 We discussed obtaining referral to physical therapy from Dr. Mitzi Hansen to aid in trismus exercises. Patient accepts results of denture adjustment. Patient to keep dentures out if sore spots develop. Use salt water rinses as needed to aid healing. Return to clinic as scheduled for denture adjustment.  Call if problems arise before then. Patient dismissed in stable condition.  Charlynne Pander, DDS

## 2012-08-22 NOTE — Patient Instructions (Addendum)
Patient to keep dentures out if sore spots develop. Use salt water rinses as needed to aid healing. Return to clinic as scheduled for denture adjustment.   Call if problems arise before then. Dr. Keylan Costabile  

## 2012-08-25 ENCOUNTER — Other Ambulatory Visit (HOSPITAL_BASED_OUTPATIENT_CLINIC_OR_DEPARTMENT_OTHER): Payer: Medicaid Other

## 2012-08-25 ENCOUNTER — Ambulatory Visit (HOSPITAL_COMMUNITY)
Admission: RE | Admit: 2012-08-25 | Discharge: 2012-08-25 | Disposition: A | Discharge status: Home / Self Care | Payer: Medicaid Other | Source: Ambulatory Visit | Attending: Oncology | Admitting: Oncology

## 2012-08-25 ENCOUNTER — Other Ambulatory Visit: Payer: Self-pay | Admitting: Oncology

## 2012-08-25 DIAGNOSIS — C099 Malignant neoplasm of tonsil, unspecified: Secondary | ICD-10-CM

## 2012-08-25 DIAGNOSIS — R590 Localized enlarged lymph nodes: Secondary | ICD-10-CM

## 2012-08-25 DIAGNOSIS — R599 Enlarged lymph nodes, unspecified: Secondary | ICD-10-CM

## 2012-08-25 LAB — CBC WITH DIFFERENTIAL/PLATELET
Basophils Absolute: 0 10*3/uL (ref 0.0–0.1)
EOS%: 4.6 % (ref 0.0–7.0)
Eosinophils Absolute: 0.2 10*3/uL (ref 0.0–0.5)
MCHC: 33.6 g/dL (ref 32.0–36.0)
MONO#: 0.8 10*3/uL (ref 0.1–0.9)
MONO%: 22.9 % — ABNORMAL HIGH (ref 0.0–14.0)
NEUT#: 1.7 10*3/uL (ref 1.5–6.5)
NEUT%: 49.8 % (ref 39.0–75.0)
RBC: 3.53 10*6/uL — ABNORMAL LOW (ref 4.20–5.82)
RDW: 16.7 % — ABNORMAL HIGH (ref 11.0–14.6)
WBC: 3.4 10*3/uL — ABNORMAL LOW (ref 4.0–10.3)
lymph#: 0.8 10*3/uL — ABNORMAL LOW (ref 0.9–3.3)

## 2012-08-25 LAB — COMPREHENSIVE METABOLIC PANEL (CC13)
AST: 20 U/L (ref 5–34)
Alkaline Phosphatase: 90 U/L (ref 40–150)
BUN: 12 mg/dL (ref 7.0–26.0)
Calcium: 9.9 mg/dL (ref 8.4–10.4)
Chloride: 104 mEq/L (ref 98–107)
Creatinine: 0.7 mg/dL (ref 0.7–1.3)

## 2012-08-25 MED ORDER — IOHEXOL 300 MG/ML  SOLN
100.0000 mL | Freq: Once | INTRAMUSCULAR | Status: AC | PRN
  Administered 2012-08-25: 100 mL via INTRAVENOUS

## 2012-08-25 NOTE — Telephone Encounter (Signed)
Message copied by Kallie Locks on Fri Aug 25, 2012  5:18 PM ------      Message from: Myrtis Ser      Created: Fri Aug 25, 2012  4:36 PM      Regarding: RE: "Refills"      Contact: [phone]       You can refill the Magic Mouthwash as previously prescribed with 2 refills. We did not prescribe the Peridex. Recommend that he talk to Dr Kristin Bruins for this.                  ----- Message -----         From: Marcell Barlow, RN         Sent: 08/25/2012   2:13 PM           To: Myrtis Ser, NP      Subject: "Refills"                                                Patient needs refills on magic mouthwash and peridex.  Let me know if I can help. Thanks.

## 2012-08-27 ENCOUNTER — Other Ambulatory Visit: Payer: Self-pay | Admitting: Oncology

## 2012-08-28 ENCOUNTER — Ambulatory Visit (HOSPITAL_BASED_OUTPATIENT_CLINIC_OR_DEPARTMENT_OTHER): Payer: Medicaid Other | Admitting: Oncology

## 2012-08-28 ENCOUNTER — Telehealth: Payer: Self-pay | Admitting: Oncology

## 2012-08-28 ENCOUNTER — Other Ambulatory Visit: Payer: Self-pay

## 2012-08-28 ENCOUNTER — Encounter: Payer: Self-pay | Admitting: Oncology

## 2012-08-28 VITALS — BP 119/74 | HR 70 | Temp 97.9°F | Resp 18 | Ht 66.0 in | Wt 120.3 lb

## 2012-08-28 DIAGNOSIS — F17200 Nicotine dependence, unspecified, uncomplicated: Secondary | ICD-10-CM

## 2012-08-28 DIAGNOSIS — C099 Malignant neoplasm of tonsil, unspecified: Secondary | ICD-10-CM

## 2012-08-28 DIAGNOSIS — D759 Disease of blood and blood-forming organs, unspecified: Secondary | ICD-10-CM

## 2012-08-28 DIAGNOSIS — E46 Unspecified protein-calorie malnutrition: Secondary | ICD-10-CM

## 2012-08-28 MED ORDER — MAGIC MOUTHWASH W/LIDOCAINE: 5.0000 mL | Freq: Four times a day (QID) | ORAL | Status: DC | PRN

## 2012-08-28 MED ORDER — PROMETHAZINE HCL 25 MG PO TABS: 25.0000 mg | ORAL_TABLET | Freq: Four times a day (QID) | ORAL | Status: DC | PRN

## 2012-08-28 MED ORDER — BUPROPION HCL ER (XL) 150 MG PO TB24: 150.0000 mg | ORAL_TABLET | Freq: Every day | ORAL | Status: DC

## 2012-08-28 NOTE — Progress Notes (Signed)
[facility]  [phone] Fax:([phone]) [phone]   OFFICE PROGRESS NOTE   Cc:  [name], MD  DIAGNOSIS: History of left tonsil squamous cell carcinoma; cT2 N2c M0.   PAST THERAPY: definitive concurrent chemoradiation with daily XRT and weekly cisplatin 40mg /m2 on between 02/01/2011 and 03/22/2011. He terminately chemotherapy prematurely with last dose given on 02/22/2011 due to prolonged cytopenia.   CURRENT THERAPY: watchful observation.   INTERVAL HISTORY: Russell Williamson 56 y.o. male returns for regular follow up by himself.  He is smoking  3-4 cigarettes per day. He still has diffuse thickening of his neck. Denies adenopathy and masses. He is s/p left neck dissection earlier this year. He still has xerostomia and dysphagia of hard foods. He uses PEG tube; his weight is declining. States he runs a feeding pumps nightly @ 200 cc/hr for about 8 hours.  He no longer has recurrent abdominal pain from pancreatitis.  He has not been drinking. 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   Patient denies fever, headache, visual changes, confusion, drenching night sweats, mucositis, odynophagia, dysphagia, nausea vomiting, jaundice, chest pain, palpitation, shortness of breath, dyspnea on exertion, productive cough, gum bleeding, epistaxis, hematemesis, hemoptysis, abdominal pain, abdominal swelling, early satiety, melena, hematochezia, hematuria, skin rash, spontaneous bleeding, joint swelling, joint pain, heat or cold intolerance, bowel bladder incontinence, back pain, focal motor weakness, paresthesia, depression, suicidal or homocidal ideation, feeling hopelessness.   Past Medical History  Diagnosis Date  . Hypertension   . Chronic pancreatitis   . Arthritis   . Hx of radiation therapy 02/02/11 to 03/22/11    L tonsil  . Seizures     alcohol-related  . Cervical adenopathy 04/26/2012  . Urinary hesitancy   . Constipation     . Cancer 12/15/10    left tonsillar squamous cell carcinoma  . Tonsillar cancer     tonsillar ca dx 12/27/10  . 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 Shortness of breath 05/23/2012    "lying down"  . History of blood transfusion ~ 2004    "from the pancreatitis"  . Daily headache 05/23/2012    "small ones"    Past Surgical History  Procedure Date  . Bile duct stent placement     hx of  . Radical neck dissection 05/23/2012    w/mass excision  . Cholecystectomy 04/2005  . Tibia fracture surgery 1990's    left  . Fracture surgery   . Direct laryngoscopy 05/23/2012    Procedure: DIRECT LARYNGOSCOPY;  Surgeon: [name], MD;  Location: [facility] OR;  Service: ENT;  Laterality: N/A;  . Mass biopsy 05/23/2012    Procedure: NECK MASS BIOPSY;  Surgeon: [name], MD;  Location: [facility] OR;  Service: ENT;  Laterality: N/A;  . Radical neck dissection 05/23/2012    Procedure: RADICAL NECK DISSECTION;  Surgeon: [name], MD;  Location: [facility] OR;  Service: ENT;  Laterality: Left;    Current Outpatient Prescriptions  Medication Sig Dispense Refill  . Alum & Mag Hydroxide-Simeth (MAGIC MOUTHWASH W/LIDOCAINE) SOLN Take 5 mLs by mouth 4 (four) times daily as needed (Swish and Spit).  500 mL  2  . buPROPion (WELLBUTRIN XL) 150 MG 24 hr tablet Take 1 tablet (150 mg total) by mouth daily.  30 tablet  2  . chlorhexidine (PERIDEX) 0.12 % solution Rinse with 15 MLS for 30 seconds 3 times daily after meals and at bedtime  480 mL  PRN  . Cholecalciferol (VITAMIN D PO) Take 1 tablet by mouth daily.      . fentaNYL (DURAGESIC - DOSED MCG/HR) 50 MCG/HR Place 1 patch onto the skin every 3 (three) days.      Marland Kitchen lidocaine (XYLOCAINE) 2 % solution GARGLE AND SPIT 5 MLS EVERY 6 HOURS WITH 5 ML OF ROBITUSSION  500 mL  0  . lipase/protease/amylase (CREON) 12000 UNITS CPEP Take 1 capsule by mouth 4 (four) times daily.      . metoprolol (LOPRESSOR) 50 MG tablet Take 50 mg by mouth 2 (two)  times daily.      . Nutritional Supplements (ENSURE PO) Take 1 Can by mouth 3 (three) times daily.      . Nutritional Supplements (OSMOLITE 1.5 CAL PO) Give 7 Cans by tube See admin instructions. 1 can=8 oz.  7 cans throughout the day      . Omega-3 Fatty Acids (FISH OIL PO) Take 1 capsule by mouth daily.      Marland Kitchen omeprazole (PRILOSEC) 40 MG capsule Take 40 mg by mouth daily.       Marland Kitchen oxyCODONE (ROXICODONE) 15 MG immediate release tablet Take 15 mg by mouth every 4 (four) hours.       . pentoxifylline (TRENTAL) 400 MG CR tablet Take 400 mg by mouth 3 (three) times daily with meals.      . promethazine (PHENERGAN) 25 MG tablet Take 1 tablet (25 mg total) by mouth every 6 (six) hours as needed. For nausea  30 tablet  2  . zolpidem (AMBIEN) 10 MG tablet Take 10 mg by mouth at bedtime.       . [DISCONTINUED] promethazine (PHENERGAN) 25 MG tablet Take 25 mg by mouth every 6 (six) hours as needed. For nausea        ALLERGIES:   has no known allergies.  REVIEW OF SYSTEMS:  The rest of the 14-point review of system was negative.   Filed Vitals:   08/28/12 1041  BP: 119/74  Pulse: 70  Temp: 97.9 F (36.6 C)  Resp: 18   Wt Readings from Last 3 Encounters:  08/28/12 120 lb 4.8 oz (54.568 kg)  06/30/12 118 lb 1.6 oz (53.57 kg)  05/23/12 124 lb 3.2 oz (56.337 kg)   ECOG Performance status: 1  PHYSICAL EXAMINATION:   General:  well-nourished man, in no acute distress.  Eyes:  no scleral icterus.  ENT:  Left upper jaw on the left, there was a dark ulcer without bleeding or purulent discharge.  Neck was without thyromegaly.   There was hyperpigmented and hyperkaratotic changes in the bilateral neck.  Lymphatics:  Negative for supraclavicular or axillary adenopathy.  It was difficult to palpate the left cervical neck note.  Respiratory: lungs were clear bilaterally without wheezing or crackles.  Cardiovascular:  Regular rate and rhythm, S1/S2, without murmur, rub or gallop.  There was no pedal edema.   GI:  abdomen was soft, flat, nontender, nondistended, without organomegaly.  Muscoloskeletal:  no spinal tenderness of palpation of vertebral spine.  Skin exam was without echymosis, petichae.  Neuro exam was nonfocal.  Patient was able to get on and off exam table without assistance.  Gait was normal.  Patient was alerted and oriented.  Attention was good.   Language was appropriate.  Mood was normal without depression.  Speech was not pressured.  Thought content was not tangential.     LABORATORY/RADIOLOGY DATA:  Lab Results  Component Value Date  WBC 3.4* 08/25/2012   HGB 12.2* 08/25/2012   HCT 36.4* 08/25/2012   PLT 131* 08/25/2012   GLUCOSE 99 08/25/2012   CHOL  Value: 91        ATP III CLASSIFICATION:  <200     mg/dL   Desirable  161-096  mg/dL   Borderline High  >=045    mg/dL   High 01/10/8118   TRIG 44 06/18/2008   HDL 40 06/18/2008   LDLCALC  Value: 42        Total Cholesterol/HDL:CHD Risk Coronary Heart Disease Risk Table                     Men   Women  1/2 Average Risk   3.4   3.3 06/18/2008   ALKPHOS 90 08/25/2012   ALT 13 08/25/2012   AST 20 08/25/2012   NA 140 08/25/2012   K 4.4 08/25/2012   CL 104 08/25/2012   CREATININE 0.7 08/25/2012   BUN 12.0 08/25/2012   CO2 29 08/25/2012   INR 1.09 06/08/2011   *RADIOLOGY REPORT*  Clinical Data: 56 year old male with history of tonsillar  carcinoma. Chemotherapy and radiation complete. Neck resection.  Dysphagia  CT NECK WITH CONTRAST  Technique: Multidetector CT imaging of the neck was performed with  intravenous contrast.  Contrast: OMNIPAQUE IOHEXOL 300 MG/ML SOLN  Comparison: 04/24/2012 and earlier.  Findings: Interval left neck dissection. Left internal jugular  vein, sternocleidomastoid muscle, and submandibular gland now  appear be surgically absent. Previously seen hyperenhancing left  level IIA lymph node also is surgically absent. 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 Postoperative and post radiation obscuration of soft  tissue planes in the neck. Other postradiation changes including  diffuse pharyngeal mucosal space thickening, small retropharyngeal  effusion and widespread subcutaneous fat stranding. No new or  increased left neck lymph nodes identified. No abnormal nodes  identified in the right neck. No discrete pharyngeal or laryngeal  mass. Thyroid remains normal. Stable visualized superior  mediastinum.  Stable and negative visualized brain parenchyma and orbit soft  tissues. Chronic mucosal thickening and small fluid level in the  left maxillary sinus. Other Visualized paranasal sinuses and  mastoids are clear. No acute osseous abnormality identified.  Stable lung apices. Emphysema. There are retained secretions in  the trachea (series 5 image 9).  IMPRESSION:  1. Interval left neck dissection. Resection of the previously  seen abnormal left level II lymph nodes.  2. No residual or recurrent tumor or lymphadenopathy identified.  Extensive post-therapy changes.  3. Layering secretions in the trachea. Aspiration not excluded.  Original Report Authenticated By: Erskine Speed, M.D.    ASSESSMENT AND PLAN:   1. History of oropharynx cancer: S/P chemo and XRT followed by a left neck dissection. He has no evidence of recurrence by labs, history, clinical exam, or CT scan. Recommend that he follow-up with Rad Onc and ENT in between visit here. He will have a surveillance CT scan in about 6 months.    2. Calorie protein malnutrition. Not using denture due to osteonecrosis of the jaw. On tube feeding, but losing weight. I have referred him back to [facility].   3. Recent history of pancreatitis. He is completely off of alcohol at this time. Unclear etiology of this most recent pancreatitis. He follows up with his PCP.  4. Submental edema: Due to treatment for his head/neck cancer. Has previously been referred to the  lymphedema  clinic. 5. Smoking: He has tried nicotine, Chantix, and smokeless cigarettes without success at quitting. I advised him that he is at very high risk of recurrence with continuing to smoke. I will try cold [place] soon. I advised him to move out of his sister house and remove himself from people who smoke to limit the peer pressure.  6. Mild cytopenia: Most likely due to recent chemo and recent pancreatitis. I have low clinical suspicion for primary bone marrow process at this time. I'll continue to monitor his CBC.  7. Follow up:  In about 6 months with repeat CT neck to ensure stability of disease in the neck.      The length of time of the face-to-face encounter was 15 minutes. More than 50% of time was spent counseling and coordination of care.

## 2012-08-28 NOTE — Telephone Encounter (Signed)
appts made and printed for pt,pt aware that he will get a call/letter with his scan appt

## 2012-08-29 ENCOUNTER — Telehealth: Payer: Self-pay | Admitting: *Deleted

## 2012-08-29 ENCOUNTER — Other Ambulatory Visit: Payer: Self-pay | Admitting: Oncology

## 2012-08-29 DIAGNOSIS — C099 Malignant neoplasm of tonsil, unspecified: Secondary | ICD-10-CM

## 2012-08-29 MED ORDER — LIDOCAINE VISCOUS HCL 2 % MT SOLN: 5.0000 mL | Freq: Four times a day (QID) | OROMUCOSAL | Status: DC | PRN

## 2012-08-29 MED ORDER — FIRST-DUKES MOUTHWASH MT SUSP: 5.0000 mL | Freq: Four times a day (QID) | OROMUCOSAL | Status: DC | PRN

## 2012-08-29 MED ORDER — MAGIC MOUTHWASH W/LIDOCAINE: 5.0000 mL | Freq: Four times a day (QID) | ORAL | Status: DC | PRN

## 2012-08-29 NOTE — Telephone Encounter (Signed)
Sister called,  States went to [facility] last night and MMW and lidocaine still not there.  [facility] states they never got order.  Informed her that MMW w/ lidocaine was called into pharmacy line yesterday by Raliegh Ip, RN.  She states pt prefers to have lidocaine separately.  Called [facility] and s/w pharmacist to call in MMW w/o lidocaine and viscous lidocaine.  Pharmacist states MMW w/ lidocaine already mixed and ready to pick up and would be wasted if order changed.  Per Clenton Pare, ok to give pt MMW w/ lidocaine and a bottle of viscous lidocaine in addition to that.  Called in viscous lidocaine in addition to MMW w/ lidocaine already mixed.  Pharmacist verbalized understanding.   Called sister back and informed of meds ordered. She verbalized understanding.

## 2012-08-30 ENCOUNTER — Other Ambulatory Visit: Payer: Self-pay | Admitting: Radiation Oncology

## 2012-08-30 DIAGNOSIS — R252 Cramp and spasm: Secondary | ICD-10-CM

## 2012-08-30 NOTE — Discharge Summary (Signed)
NAME:  [name], [name] NO.:  [id number]  MEDICAL RECORD NO.:  [id number]  LOCATION:                                 FACILITY:  PHYSICIAN:  Joanne Gavel, M.D.        DATE OF BIRTH:  [birth year]  DATE OF ADMISSION: DATE OF DISCHARGE:                              DISCHARGE SUMMARY   Mr. Russell Williamson was admitted to this clinic on June 27, 2012.  He had undergone a course of HBO treatment and he was going to have another course at the request of his surgeon, Dr. Ezzard Standing.  We were unable to get him cleared for HBO.  We never could get in touch with proper people with Medicaid but we put the patient on antibiotic and the wound has healed spontaneously.  We were packing the wound until August 15, 2012 and on August 29, 2012, the wound was completely healed.  Followup is to see [place] p.r.n.     Joanne Gavel, M.D.     RA/MEDQ  D:  08/29/2012  T:  08/30/2012  Job:  [id number]

## 2012-09-05 ENCOUNTER — Encounter (HOSPITAL_BASED_OUTPATIENT_CLINIC_OR_DEPARTMENT_OTHER): Payer: Medicaid Other

## 2012-09-05 ENCOUNTER — Ambulatory Visit (HOSPITAL_COMMUNITY): Payer: Medicaid - Dental | Admitting: Dentistry

## 2012-09-05 ENCOUNTER — Encounter (HOSPITAL_COMMUNITY): Payer: Self-pay | Admitting: Dentistry

## 2012-09-05 VITALS — BP 136/82 | HR 60 | Temp 97.9°F

## 2012-09-05 DIAGNOSIS — K08109 Complete loss of teeth, unspecified cause, unspecified class: Secondary | ICD-10-CM

## 2012-09-05 DIAGNOSIS — Z09 Encounter for follow-up examination after completed treatment for conditions other than malignant neoplasm: Secondary | ICD-10-CM

## 2012-09-05 DIAGNOSIS — I890 Lymphedema, not elsewhere classified: Secondary | ICD-10-CM

## 2012-09-05 DIAGNOSIS — R252 Cramp and spasm: Secondary | ICD-10-CM

## 2012-09-05 DIAGNOSIS — Z463 Encounter for fitting and adjustment of dental prosthetic device: Secondary | ICD-10-CM

## 2012-09-05 DIAGNOSIS — Z85819 Personal history of malignant neoplasm of unspecified site of lip, oral cavity, and pharynx: Secondary | ICD-10-CM

## 2012-09-05 NOTE — Patient Instructions (Addendum)
Patient to keep dentures out if sore spots develop. Use salt water rinses as needed to aid healing. Return to clinic as scheduled for denture adjustment.  Call if problems arise before then. We discussed obtaining referral to physical therapy for aid in exercises for lymphedema and for trismus. Patient to think about this referral and discuss with Dr. Mitzi Hansen.  Dr. Kristin Bruins

## 2012-09-05 NOTE — Progress Notes (Signed)
09/05/2012  Patient:            Russell Williamson Date of Birth:  09-Feb-1956 MRN:                [id number]   BP 136/82  Pulse 60  Temp 97.9 F (36.6 C)  [name] presents for evaluation of recently inserted upper and lower complete denture relines. Subjective: Patient is complaining of irritation to left lateral border. Patient also complaining of early morning "swelling" that possibly could be related to to decreased lymph drainage S/P radiation therapy.  Exam: There is no evidence of denture irritation or ulcerations. Generalized xerostomia noted. Multiple areas of nonspecific erythema noted as well most likely secondary to the xerostomia and persistent smoking. Procedure: Pressure indicating paste applied to dentures. Adjustments made as needed. [name]. Occlusion evaluated and no adjustments made at this time. Patient still with tendency to protrude to end to end position. Patient with decreased maximum interincisal opening as before most likely secondary to previous radiation therapy as well. We discussed obtaining referral to physical therapy for aid in exercises for lymphedema and for trismus. Patient accepts results of denture adjustment. Patient to keep dentures out if sore spots develop. Use salt water rinses as needed to aid healing. Return to clinic as scheduled for denture adjustment.  Call if problems arise before then. Patient dismissed in stable condition.  Charlynne Pander, DDS

## 2012-09-06 ENCOUNTER — Other Ambulatory Visit: Payer: Self-pay | Admitting: Radiation Oncology

## 2012-09-06 DIAGNOSIS — C801 Malignant (primary) neoplasm, unspecified: Secondary | ICD-10-CM

## 2012-09-07 ENCOUNTER — Ambulatory Visit: Payer: Medicaid Other | Attending: Radiation Oncology | Admitting: Physical Therapy

## 2012-09-07 DIAGNOSIS — IMO0001 Reserved for inherently not codable concepts without codable children: Secondary | ICD-10-CM | POA: Insufficient documentation

## 2012-09-07 DIAGNOSIS — M2569 Stiffness of other specified joint, not elsewhere classified: Secondary | ICD-10-CM | POA: Insufficient documentation

## 2012-09-07 DIAGNOSIS — M542 Cervicalgia: Secondary | ICD-10-CM | POA: Insufficient documentation

## 2012-09-08 ENCOUNTER — Ambulatory Visit: Payer: Medicaid Other | Admitting: Nutrition

## 2012-09-08 NOTE — Progress Notes (Signed)
Patient reports for nutrition followup. Patient states he is continuing to use his feeding tube overnight and some throughout the day administering Jevity 1.5 in a continuous feeding. Patient states he utilizes 8 cans a day. He tolerates up to 200 mL an hour. He flushes his feeding tube with approximately 30 mL of free water before and after continuous feedings. Patient states he has tried to eat some soft foods and liquids. He needs to consume oral diet slowly in order to tolerate. He states he drinks 3-4 Ensure Plus a day. Tube feeding plus oral supplements in totals as described by patient eaual greater than 3800 calories daily Patient's weight was documented as 124.2 pounds today.  Nutrition diagnosis: Inadequate oral intake continues.  Intervention: Mr. Mallek has been unable to increase oral intake to promote weight maintenance. He has required continued feedings through his feeding tube to provide additional calories and protein. He requires a high amount of calories in order to promote weight maintenance, therefore, I've encouraged Mr. Lezotte to continue continuous feedings via feeding tube at night as well as Ensure Plus throughout the day by mouth. I've encouraged continued oral intake as tolerated.  Monitoring, evaluation, goals: Patient is tolerating oral feedings as well as continuous tube feedings and oral intake to promote weight maintenance/very slow weight gain.  Next visit: Patient will contact me for questions or concerns or change in status.

## 2012-09-13 ENCOUNTER — Ambulatory Visit: Payer: Medicaid Other | Admitting: Physical Therapy

## 2012-09-20 ENCOUNTER — Ambulatory Visit: Payer: Medicaid Other | Admitting: Physical Therapy

## 2012-09-21 ENCOUNTER — Ambulatory Visit
Admission: RE | Admit: 2012-09-21 | Discharge: 2012-09-21 | Disposition: A | Discharge status: Home / Self Care | Payer: Medicaid Other | Source: Ambulatory Visit | Attending: Radiation Oncology | Admitting: Radiation Oncology

## 2012-09-21 ENCOUNTER — Encounter: Payer: Self-pay | Admitting: Radiation Oncology

## 2012-09-21 VITALS — BP 131/84 | HR 58 | Temp 98.0°F | Wt 123.2 lb

## 2012-09-21 DIAGNOSIS — C099 Malignant neoplasm of tonsil, unspecified: Secondary | ICD-10-CM

## 2012-09-21 DIAGNOSIS — Z79899 Other long term (current) drug therapy: Secondary | ICD-10-CM | POA: Insufficient documentation

## 2012-09-21 HISTORY — DX: Personal history of irradiation: Z92.3

## 2012-09-21 MED ORDER — LARYNGOSCOPY SOLUTION RAD-ONC
15.0000 mL | Freq: Once | TOPICAL | Status: AC
  Administered 2012-09-21: 15 mL via TOPICAL
  Filled 2012-09-21: qty 15

## 2012-09-21 NOTE — Progress Notes (Signed)
Radiation Oncology         [phone] ________________________________  Name: Russell Williamson MRN: [id number]  Date: 09/21/2012  DOB: 30-Jul-1956  Follow-Up Visit Note  CC: Russell Lesch, MD  Russell Williamson, *  Diagnosis:   Squamous cell carcinoma of the left tonsil  Interval Since Last Radiation:  18 months   Narrative:  The patient returns today for routine follow-up.  The patient states that he has been doing fair. He has continued to heal in the left neck region since he was last seen. He still did have an open area postoperatively during his last visit. The patient relates no substantial new complaints. He has some continued changes in swallowing although he states that he is doing fairly well with this and he chews his food carefully. He is still using his feeding tube. I had discussed the patient with Dr. Kristin Bruins previously and he has been referred for physical therapy evaluation due to some fibrosis/trismus. Ongoing dry mouth.                              ALLERGIES:   has no known allergies.  Meds: Current Outpatient Prescriptions  Medication Sig Dispense Refill  . Alum & Mag Hydroxide-Simeth (MAGIC MOUTHWASH W/LIDOCAINE) SOLN Take 5 mLs by mouth every 6 (six) hours as needed (Swish and Spit).  500 mL  0  . buPROPion (WELLBUTRIN XL) 150 MG 24 hr tablet Take 1 tablet (150 mg total) by mouth daily.  30 tablet  2  . chlorhexidine (PERIDEX) 0.12 % solution Rinse with 15 MLS for 30 seconds 3 times daily after meals and at bedtime  480 mL  PRN  . Cholecalciferol (VITAMIN D PO) Take 1 tablet by mouth daily.      . fentaNYL (DURAGESIC - DOSED MCG/HR) 50 MCG/HR Place 1 patch onto the skin every 3 (three) days.      . Lidocaine HCl 2 % SOLN Swish and spit 5 mLs every 6 (six) hours as needed (mouth sores).  500 mL  0  . lipase/protease/amylase (CREON) 12000 UNITS CPEP Take 1 capsule by mouth 4 (four) times daily.      . methocarbamol (ROBAXIN) 750 MG tablet Take 750 mg by  mouth 3 (three) times daily. Total 90 tabs. [facility].  Russell Peters, NP-C      . metoprolol (LOPRESSOR) 50 MG tablet Take 50 mg by mouth 2 (two) times daily.      . Nutritional Supplements (ENSURE PO) Take 1 Can by mouth 3 (three) times daily.      . Nutritional Supplements (OSMOLITE 1.5 CAL PO) Give 7 Cans by tube See admin instructions. 1 can=8 oz.  7 cans throughout the day      . Omega-3 Fatty Acids (FISH OIL PO) Take 1 capsule by mouth daily.      Russell Williamson omeprazole (PRILOSEC) 40 MG capsule Take 40 mg by mouth daily.       Russell Williamson oxyCODONE (ROXICODONE) 15 MG immediate release tablet Take 15 mg by mouth every 4 (four) hours.       . pentoxifylline (TRENTAL) 400 MG CR tablet Take 400 mg by mouth 3 (three) times daily with meals.      . promethazine (PHENERGAN) 25 MG tablet Take 1 tablet (25 mg total) by mouth every 6 (six) hours as needed. For nausea  30 tablet  2  . zolpidem (AMBIEN) 10 MG tablet Take 10 mg by mouth at bedtime.  Physical Findings: The patient is in no acute distress. Patient is alert and oriented.  weight is 123 lb 3.2 oz (55.883 kg). His temperature is 98 F (36.7 C). His blood pressure is 131/84 and his pulse is 58. .   Oral cavity: Clear, some moisture present, no lesions Neck: Radiation fibrosis present, greater on the left with some postsurgical changes. He left neck really is quite firm, no nodularity noted. No lymphadenopathy  Fiberoptic exam:  After the use of topical anesthetic, the flexible laryngoscope was passed through the left nare. Good visualization was obtained. No lesions or suspicious findings within the larynx, hypopharynx, oropharynx or nasopharynx. Some fibrosis, reduction in the epiglottis present.   Lab Findings: Lab Results  Component Value Date   WBC 3.4* 08/25/2012   HGB 12.2* 08/25/2012   HCT 36.4* 08/25/2012   MCV 102.9* 08/25/2012   PLT 131* 08/25/2012     Radiographic Findings: Ct Soft Tissue Neck W Contrast  08/25/2012   *RADIOLOGY REPORT*  Clinical Data: 56 year old male with history of tonsillar carcinoma.  Chemotherapy and radiation complete.  Neck resection. Dysphagia  CT NECK WITH CONTRAST  Technique:  Multidetector CT imaging of the neck was performed with intravenous contrast.  Contrast: OMNIPAQUE IOHEXOL 300 MG/ML  SOLN  Comparison: 04/24/2012 and earlier.  Findings: Interval left neck dissection.  Left internal jugular vein, sternocleidomastoid muscle, and submandibular gland now appear be surgically absent.  Previously seen hyperenhancing left level IIA lymph node also is surgically absent.  This was positive for metastatic squamous cell carcinoma with extracapsular extension.  Postoperative and post radiation obscuration of soft tissue planes in the neck.  Other postradiation changes including diffuse pharyngeal mucosal space thickening, small retropharyngeal effusion and widespread subcutaneous fat stranding. No new or increased left neck lymph nodes identified.  No abnormal nodes identified in the right neck.  No discrete pharyngeal or laryngeal mass.  Thyroid remains normal.  Stable visualized superior mediastinum.  Stable and negative visualized brain parenchyma and orbit soft tissues.  Chronic mucosal thickening and small fluid level in the left maxillary sinus.  Other Visualized paranasal sinuses and mastoids are clear.  No acute osseous abnormality identified.  Stable lung apices.  Emphysema. There are retained secretions in the trachea (series 5 image 9).  IMPRESSION: 1.  Interval left neck dissection.  Resection of the previously seen abnormal left level II lymph nodes. 2.  No residual or recurrent tumor or lymphadenopathy identified. Extensive post-therapy changes. 3.  Layering secretions in the trachea.  Aspiration not excluded.   Original Report Authenticated By: Erskine Speed, M.D.     Impression:    The patient has been stable, and has been recovering from his surgery several months ago. The patient's  CT scan looked fine in November with no evidence of recurrent/residual disease. Fiberoptic exam today was without worrisome findings.  Plan:  He'll return to clinic in 6 months.   Radene Gunning, M.D., Ph.D.

## 2012-09-21 NOTE — Progress Notes (Signed)
FU appt. Today.  S/P radiation to Tonsillar region which completed on 03/22/11.  He states he can swallow soft foods only and continues to instill Osmolite via Peg; ~ 6 cans daily via infusion pump. Seen by Dr. Kristin Bruins yesterday and is being referred for assessment because of the resulting Swelling and firmness of the chin  Region.  He states he does his jaw exercises sometimes and he is no longer being seen by outpatient rehab for swallowing because of lack of insurance coverage.  He is currently being seen for Lymphedema management in Outpatient rehab and has been there twice thus far.  Oral mucosa intact and pink and he reports dry mouth and the need to keep water with him at all times.

## 2012-09-28 ENCOUNTER — Ambulatory Visit: Payer: Medicaid Other | Admitting: Physical Therapy

## 2012-10-03 ENCOUNTER — Ambulatory Visit: Payer: Medicaid Other | Admitting: Physical Therapy

## 2012-10-04 ENCOUNTER — Other Ambulatory Visit: Payer: Self-pay | Admitting: Oncology

## 2012-10-06 ENCOUNTER — Other Ambulatory Visit: Payer: Self-pay | Admitting: *Deleted

## 2012-10-06 ENCOUNTER — Telehealth: Payer: Self-pay | Admitting: *Deleted

## 2012-10-06 DIAGNOSIS — C099 Malignant neoplasm of tonsil, unspecified: Secondary | ICD-10-CM

## 2012-10-06 MED ORDER — LIDOCAINE VISCOUS HCL 2 % MT SOLN: 5.0000 mL | Freq: Four times a day (QID) | OROMUCOSAL | Status: DC | PRN

## 2012-10-06 NOTE — Telephone Encounter (Signed)
Sister called w/ request for refill on viscous lidocaine.  Informed her refill was called in by one of our nurses earlier today.  She verbalized understanding and will check with pt's pharmacy.

## 2012-10-11 ENCOUNTER — Ambulatory Visit: Payer: Medicaid Other | Attending: Radiation Oncology | Admitting: Physical Therapy

## 2012-10-11 ENCOUNTER — Encounter: Payer: Self-pay | Admitting: Physical Therapy

## 2012-10-11 DIAGNOSIS — IMO0001 Reserved for inherently not codable concepts without codable children: Secondary | ICD-10-CM | POA: Insufficient documentation

## 2012-10-11 DIAGNOSIS — M2569 Stiffness of other specified joint, not elsewhere classified: Secondary | ICD-10-CM | POA: Insufficient documentation

## 2012-10-11 DIAGNOSIS — M542 Cervicalgia: Secondary | ICD-10-CM | POA: Insufficient documentation

## 2012-10-18 ENCOUNTER — Encounter: Payer: Self-pay | Admitting: Physical Therapy

## 2012-10-18 ENCOUNTER — Ambulatory Visit: Payer: Medicaid Other | Admitting: Physical Therapy

## 2012-10-25 ENCOUNTER — Ambulatory Visit: Payer: Medicaid Other | Admitting: Physical Therapy

## 2012-10-25 ENCOUNTER — Encounter: Payer: Self-pay | Admitting: Physical Therapy

## 2012-10-31 ENCOUNTER — Ambulatory Visit: Payer: Medicaid Other | Admitting: Physical Therapy

## 2012-11-01 ENCOUNTER — Ambulatory Visit: Payer: Medicaid Other | Admitting: Physical Therapy

## 2012-11-01 ENCOUNTER — Encounter: Payer: Self-pay | Admitting: Physical Therapy

## 2012-11-07 ENCOUNTER — Encounter (HOSPITAL_COMMUNITY): Payer: Self-pay | Admitting: Dentistry

## 2012-11-07 ENCOUNTER — Ambulatory Visit (HOSPITAL_COMMUNITY): Payer: Medicaid - Dental | Admitting: Dentistry

## 2012-11-07 VITALS — BP 126/74 | HR 63 | Temp 99.2°F

## 2012-11-07 DIAGNOSIS — K062 Gingival and edentulous alveolar ridge lesions associated with trauma: Secondary | ICD-10-CM

## 2012-11-07 DIAGNOSIS — R252 Cramp and spasm: Secondary | ICD-10-CM

## 2012-11-07 DIAGNOSIS — Z463 Encounter for fitting and adjustment of dental prosthetic device: Secondary | ICD-10-CM

## 2012-11-07 DIAGNOSIS — K117 Disturbances of salivary secretion: Secondary | ICD-10-CM

## 2012-11-07 DIAGNOSIS — K1370 Unspecified lesions of oral mucosa: Secondary | ICD-10-CM

## 2012-11-07 NOTE — Patient Instructions (Signed)
Patient to keep dentures out if sore spots develop. Use salt water rinses as needed to aid healing. Return to clinic as scheduled for denture adjustment.  Call if problems arise before then.  

## 2012-11-07 NOTE — Progress Notes (Signed)
11/07/2012  Patient:            Russell Williamson Date of Birth:  16-Mar-1956 MRN:                [id number]   BP 126/74  Pulse 63  Temp 99.2 F (37.3 C) (Oral)  [name] presents for evaluation of recently inserted upper and lower complete denture relines. Subjective: Patient is complaining of irritation to lower right lingual alveolar ridge. Patient has been seen by physical therapy for evaluation for trismus exercises with prior approval pending by patient report. Exam: There is some erythema to the mandibular left lingual alveolar ridge in the area of tooth numbers 29-31. No ulcerations are noted. No exposed bone is noted. Maximum interincisal opening approximately 24 mm today.  Generalized xerostomia noted. Multiple areas of nonspecific erythema noted as well, most likely secondary to the xerostomia and persistent smoking. Procedure: Pressure indicating paste applied to dentures. Adjustments made as needed. [name]. Occlusion evaluated and no adjustments made at this time. Patient still with tendency to protrude to end to end position. Patient with decreased maximum interincisal opening as before most likely secondary to previous radiation therapy with physical therapy treatments pending . Patient accepts results of denture adjustment. Patient to keep dentures out if sore spots develop. Use salt water rinses as needed to aid healing. Return to clinic as scheduled for denture adjustment.  Call if problems arise before then. Patient dismissed in stable condition.  Charlynne Pander, DDS

## 2012-11-08 ENCOUNTER — Telehealth: Payer: Self-pay | Admitting: Nutrition

## 2012-11-08 NOTE — Telephone Encounter (Signed)
I contacted patient via phone secondary to his request. Apparently, patient stopped by my office requesting ensure. I was unable to speak with patient as he was sleeping but did leave a message for him to contact me with questions or concerns.

## 2012-11-13 ENCOUNTER — Other Ambulatory Visit: Payer: Self-pay | Admitting: Oncology

## 2012-11-14 ENCOUNTER — Other Ambulatory Visit: Payer: Self-pay | Admitting: *Deleted

## 2012-11-14 DIAGNOSIS — C099 Malignant neoplasm of tonsil, unspecified: Secondary | ICD-10-CM

## 2012-11-14 MED ORDER — LIDOCAINE VISCOUS HCL 2 % MT SOLN: 5.0000 mL | Freq: Four times a day (QID) | OROMUCOSAL | Status: DC | PRN

## 2012-11-29 ENCOUNTER — Encounter (HOSPITAL_COMMUNITY): Payer: Self-pay | Admitting: Dentistry

## 2012-11-29 ENCOUNTER — Telehealth: Payer: Self-pay

## 2012-11-29 NOTE — Telephone Encounter (Signed)
Spoke with Orene Desanctis whom states patient has developed a knot on his neck and is having difficulty swallowing.Informed to call Dr.Newman for assessment and if unable to get patient an appointment to give me a call back.Confirmed other appointments on calendar with Dr.Kulinski, ct scan and follow up with Dr.Moody on 04/19/13.

## 2012-12-13 ENCOUNTER — Encounter (HOSPITAL_COMMUNITY): Payer: Self-pay | Admitting: Dentistry

## 2012-12-18 ENCOUNTER — Ambulatory Visit (HOSPITAL_COMMUNITY): Payer: Medicaid - Dental | Admitting: Dentistry

## 2012-12-18 ENCOUNTER — Encounter (HOSPITAL_COMMUNITY): Payer: Self-pay | Admitting: Dentistry

## 2012-12-18 VITALS — BP 123/68 | HR 58 | Temp 98.5°F

## 2012-12-18 DIAGNOSIS — Z463 Encounter for fitting and adjustment of dental prosthetic device: Secondary | ICD-10-CM

## 2012-12-18 NOTE — Progress Notes (Signed)
12/18/2012  Patient:            Russell Williamson Date of Birth:  04-May-1956 MRN:                [id number]   BP 123/68  Pulse 58  Temp(Src) 98.5 F (36.9 C) (Oral)  [name] presents for evaluation of upper and lower complete dentures that were relined in November of 2013. Subjective: Patient is complaining of slight irritation to lower left alveolar ridge. Patient has been seen by physical therapy and is doing trismus exercises provided by the physical therapist.  Exam: There is generalized erythema noted. No ulcerations are noted. No exposed bone is noted. Maximum interincisal opening approximately 28mm up from previous 24 mm.  Generalized xerostomia noted. Multiple areas of nonspecific erythema are most likely secondary to the xerostomia and persistent smoking. Procedure: Pressure indicating paste applied to dentures. Minimal adjustments made as needed. [name]. Occlusion evaluated and no adjustments made at this time. Patient still with tendency to protrude to end to end position.  Patient accepts results of denture adjustment. Patient to continue trismus exercises as per physical therapy instructions. Patient to keep dentures out if sore spots develop. Use salt water rinses as needed to aid healing. Return to clinic as scheduled for denture adjustment.  Call if problems arise before then. Patient dismissed in stable condition.  Charlynne Pander, DDS

## 2012-12-18 NOTE — Patient Instructions (Addendum)
Patient to continue trismus exercises as instructed by physical therapy. Patient to keep dentures out if sore spots develop. Use salt water rinses as needed to aid healing. Return to clinic as scheduled for denture adjustment.   Call if problems arise before then.  Dr. Kristin Bruins

## 2013-01-19 ENCOUNTER — Telehealth: Payer: Self-pay | Admitting: Oncology

## 2013-01-25 ENCOUNTER — Other Ambulatory Visit: Payer: Self-pay | Admitting: *Deleted

## 2013-01-25 NOTE — Telephone Encounter (Signed)
Rec'd fax request from [facility] for Bupropion XL. Discussed with Kristen Curcio,NP, patient has not been taken as prescribed according to pharmacy history, which is ineffective treatment. To be discussed at next office visit. Returned fax.

## 2013-02-05 ENCOUNTER — Other Ambulatory Visit: Payer: Self-pay | Admitting: Oncology

## 2013-02-23 ENCOUNTER — Ambulatory Visit (HOSPITAL_COMMUNITY)
Admission: RE | Admit: 2013-02-23 | Discharge: 2013-02-23 | Disposition: A | Discharge status: Home / Self Care | Payer: Medicaid Other | Source: Ambulatory Visit | Attending: Oncology | Admitting: Oncology

## 2013-02-23 DIAGNOSIS — Z923 Personal history of irradiation: Secondary | ICD-10-CM | POA: Insufficient documentation

## 2013-02-23 DIAGNOSIS — R609 Edema, unspecified: Secondary | ICD-10-CM | POA: Insufficient documentation

## 2013-02-23 DIAGNOSIS — Z9221 Personal history of antineoplastic chemotherapy: Secondary | ICD-10-CM | POA: Insufficient documentation

## 2013-02-23 DIAGNOSIS — R1310 Dysphagia, unspecified: Secondary | ICD-10-CM | POA: Insufficient documentation

## 2013-02-23 DIAGNOSIS — C099 Malignant neoplasm of tonsil, unspecified: Secondary | ICD-10-CM | POA: Insufficient documentation

## 2013-02-23 DIAGNOSIS — R234 Changes in skin texture: Secondary | ICD-10-CM | POA: Insufficient documentation

## 2013-02-23 MED ORDER — IOHEXOL 300 MG/ML  SOLN
100.0000 mL | Freq: Once | INTRAMUSCULAR | Status: AC | PRN
  Administered 2013-02-23: 100 mL via INTRAVENOUS

## 2013-02-26 ENCOUNTER — Other Ambulatory Visit: Payer: Self-pay

## 2013-02-26 ENCOUNTER — Other Ambulatory Visit: Payer: Self-pay | Admitting: Lab

## 2013-02-27 ENCOUNTER — Other Ambulatory Visit (HOSPITAL_BASED_OUTPATIENT_CLINIC_OR_DEPARTMENT_OTHER): Payer: Medicaid Other | Admitting: Lab

## 2013-02-27 DIAGNOSIS — C099 Malignant neoplasm of tonsil, unspecified: Secondary | ICD-10-CM

## 2013-02-27 LAB — CBC WITH DIFFERENTIAL/PLATELET
EOS%: 1.9 % (ref 0.0–7.0)
Eosinophils Absolute: 0.1 10*3/uL (ref 0.0–0.5)
HGB: 12.2 g/dL — ABNORMAL LOW (ref 13.0–17.1)
MCV: 104 fL — ABNORMAL HIGH (ref 79.3–98.0)
MONO%: 27.9 % — ABNORMAL HIGH (ref 0.0–14.0)
NEUT#: 3.1 10*3/uL (ref 1.5–6.5)
RBC: 3.6 10*6/uL — ABNORMAL LOW (ref 4.20–5.82)
RDW: 14.1 % (ref 11.0–14.6)
lymph#: 0.8 10*3/uL — ABNORMAL LOW (ref 0.9–3.3)

## 2013-02-27 LAB — COMPREHENSIVE METABOLIC PANEL (CC13)
AST: 19 U/L (ref 5–34)
Albumin: 3.5 g/dL (ref 3.5–5.0)
Alkaline Phosphatase: 78 U/L (ref 40–150)
Calcium: 9.1 mg/dL (ref 8.4–10.4)
Chloride: 102 mEq/L (ref 98–107)
Glucose: 118 mg/dl — ABNORMAL HIGH (ref 70–99)
Potassium: 4.1 mEq/L (ref 3.5–5.1)
Sodium: 140 mEq/L (ref 136–145)
Total Protein: 7.6 g/dL (ref 6.4–8.3)

## 2013-03-01 ENCOUNTER — Telehealth: Payer: Self-pay | Admitting: Oncology

## 2013-03-01 ENCOUNTER — Ambulatory Visit (HOSPITAL_BASED_OUTPATIENT_CLINIC_OR_DEPARTMENT_OTHER): Payer: Medicaid Other | Admitting: Oncology

## 2013-03-01 ENCOUNTER — Telehealth: Payer: Self-pay | Admitting: *Deleted

## 2013-03-01 VITALS — BP 119/77 | HR 72 | Temp 99.2°F | Resp 18 | Ht 66.0 in | Wt 126.9 lb

## 2013-03-01 DIAGNOSIS — K1230 Oral mucositis (ulcerative), unspecified: Secondary | ICD-10-CM

## 2013-03-01 DIAGNOSIS — C099 Malignant neoplasm of tonsil, unspecified: Secondary | ICD-10-CM

## 2013-03-01 DIAGNOSIS — E46 Unspecified protein-calorie malnutrition: Secondary | ICD-10-CM

## 2013-03-01 DIAGNOSIS — F17200 Nicotine dependence, unspecified, uncomplicated: Secondary | ICD-10-CM

## 2013-03-01 MED ORDER — LIDOCAINE VISCOUS HCL 2 % MT SOLN: 5.0000 mL | Freq: Four times a day (QID) | OROMUCOSAL | Status: DC | PRN

## 2013-03-01 NOTE — Telephone Encounter (Signed)
Appt made w/ Dr. Ezzard Standing for Monday 6/2 at 12:45 pm.  Pt already has appt w/ Dr. Kristin Bruins on 6/10.   Informed pt's sister of both of these appts and she will arrange transportation for pt.

## 2013-03-01 NOTE — Progress Notes (Signed)
[facility]  Telephone:(336) [phone] Fax:(336) [phone]   OFFICE PROGRESS NOTE   Cc:  [name], MD  DIAGNOSIS: History of left tonsil squamous cell carcinoma; cT2 N2c M0.   PAST THERAPY: definitive concurrent chemoradiation with daily XRT and weekly cisplatin 40mg /m2 on between 02/01/2011 and 03/22/2011. He terminately chemotherapy prematurely with last dose given on 02/22/2011 due to prolonged cytopenia.   CURRENT THERAPY: watchful observation.   INTERVAL HISTORY: Russell Williamson 57 y.o. male returns for regular follow up by himself.  He has moderate right jaw pain along the gum line.  He feels that there is a piece of bone protruding.  He still smokes about 2-3 cigarettes a day despite using electronic cigarettes, Bupropion, Nicoderm.  He is able to have small amount of pureed foods.  However, regular foods make him choked.  He has tight fibrotic changes in the neck despite doing physical therapy.  He has mild fatigue; however, he is still independent of all activities of daily living.   Patient denies fever, anorexia, weight loss, headache, visual changes, confusion, drenching night sweats, palpable lymph node swelling, mucositis, odynophagia, dysphagia, nausea vomiting, jaundice, chest pain, palpitation, shortness of breath, dyspnea on exertion, productive cough, gum bleeding, epistaxis, hematemesis, hemoptysis, abdominal pain, abdominal swelling, early satiety, melena, hematochezia, hematuria, skin rash, spontaneous bleeding, joint swelling, joint pain, heat or cold intolerance, bowel bladder incontinence, back pain, focal motor weakness, paresthesia, depression.     Past Medical History  Diagnosis Date  . Hypertension   . Chronic pancreatitis   . Arthritis   . Hx of radiation therapy 02/02/11 to 03/22/11    L tonsil  . Seizures     alcohol-related  . Cervical adenopathy 04/26/2012  . Urinary hesitancy   . Constipation   . Cancer 12/15/10    left tonsillar  squamous cell carcinoma  . Tonsillar cancer     tonsillar ca dx 12/27/10  . 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 Shortness of breath 05/23/2012    "lying down"  . History of blood transfusion ~ 2004    "from the pancreatitis"  . Daily headache 05/23/2012    "small ones"  . History of radiation therapy 02/02/2011-03/22/2011    head/neck,left tonsil    Past Surgical History  Procedure Laterality Date  . Bile duct stent placement      hx of  . Radical neck dissection  05/23/2012    w/mass excision  . Cholecystectomy  04/2005  . Tibia fracture surgery  1990's    left  . Fracture surgery    . Direct laryngoscopy  05/23/2012    Procedure: DIRECT LARYNGOSCOPY;  Surgeon: [name], MD;  Location: [facility] OR;  Service: ENT;  Laterality: N/A;  . Mass biopsy  05/23/2012    Procedure: NECK MASS BIOPSY;  Surgeon: [name], MD;  Location: [facility] OR;  Service: ENT;  Laterality: N/A;  . Radical neck dissection  05/23/2012    Procedure: RADICAL NECK DISSECTION;  Surgeon: [name], MD;  Location: [facility] OR;  Service: ENT;  Laterality: Left;    Current Outpatient Prescriptions  Medication Sig Dispense Refill  . Alum & Mag Hydroxide-Simeth (MAGIC MOUTHWASH W/LIDOCAINE) SOLN Take 5 mLs by mouth every 6 (six) hours as needed (Swish and Spit).  500 mL  0  . buPROPion (WELLBUTRIN XL) 150 MG 24 hr tablet Take 1 tablet (150 mg total) by mouth daily.  30 tablet  2  . butalbital-acetaminophen-caffeine (FIORICET, ESGIC)  50-325-40 MG per tablet Take 1 tablet by mouth daily as needed for headache.      . chlorhexidine (PERIDEX) 0.12 % solution Rinse with 15 MLS for 30 seconds 3 times daily after meals and at bedtime  480 mL  PRN  . Cholecalciferol (VITAMIN D PO) Take 1 tablet by mouth daily.      . fentaNYL (DURAGESIC - DOSED MCG/HR) 50 MCG/HR Place 1 patch onto the skin every 3 (three) days.      . Lidocaine HCl 2 % SOLN Swish and spit 5 mLs every 6 (six) hours as needed (mouth  sores).  500 mL  5  . lipase/protease/amylase (CREON) 12000 UNITS CPEP Take 1 capsule by mouth 4 (four) times daily.      . metoprolol (LOPRESSOR) 50 MG tablet Take 50 mg by mouth 2 (two) times daily.      . Nutritional Supplements (ENSURE PO) Take 1 Can by mouth 3 (three) times daily.      . Nutritional Supplements (OSMOLITE 1.5 CAL PO) Give 7 Cans by tube See admin instructions. 1 can=8 oz.  7 cans throughout the day      . Omega-3 Fatty Acids (FISH OIL PO) Take 1 capsule by mouth daily.      Marland Kitchen omeprazole (PRILOSEC) 40 MG capsule Take 40 mg by mouth daily.       Marland Kitchen oxyCODONE (ROXICODONE) 15 MG immediate release tablet Take 15 mg by mouth every 4 (four) hours.       . pentoxifylline (TRENTAL) 400 MG CR tablet Take 400 mg by mouth 3 (three) times daily with meals.      . promethazine (PHENERGAN) 25 MG tablet Take 1 tablet (25 mg total) by mouth every 6 (six) hours as needed. For nausea  30 tablet  2  . temazepam (RESTORIL) 30 MG capsule Take 30 mg by mouth at bedtime as needed for sleep.       No current facility-administered medications for this visit.    ALLERGIES:  has No Known Allergies.  REVIEW OF SYSTEMS:  The rest of the 14-point review of system was negative.   Filed Vitals:   03/01/13 1020  BP: 119/77  Pulse: 72  Temp: 99.2 F (37.3 C)  Resp: 18   Wt Readings from Last 3 Encounters:  03/01/13 126 lb 14.4 oz (57.561 kg)  09/21/12 123 lb 3.2 oz (55.883 kg)  09/08/12 124 lb 3.2 oz (56.337 kg)   ECOG Performance status: 1  PHYSICAL EXAMINATION:   General:  well-nourished man, in no acute distress.  Eyes:  no scleral icterus.  ENT:  There was no apparent ulcer.  However, there was mild erythema at the right lower posterior gum line. I could not appreciate any exposed bone.  Neck was without thyromegaly.   There was hyperpigmented and hyperkaratotic changes in the bilateral neck.  Lymphatics:  Was difficult to assess his neck.  But it was negative for supraclavicular or  axillary adenopathy. Respiratory: lungs were clear bilaterally without wheezing or crackles.  Cardiovascular:  Regular rate and rhythm, S1/S2, without murmur, rub or gallop.  There was no pedal edema.  GI:  abdomen was soft, flat, nontender, nondistended, without organomegaly.  PEG tube was dry, clean, intact.  Muscoloskeletal:  no spinal tenderness of palpation of vertebral spine.  Skin exam was without echymosis, petichae.  Neuro exam was nonfocal.  Patient was able to get on and off exam table without assistance.  Gait was normal.  Patient was alert and oriented.  Attention was good.   Language was appropriate.  Mood was normal without depression.  Speech was not pressured.  Thought content was not tangential.     LABORATORY/RADIOLOGY DATA:  Lab Results  Component Value Date   WBC 5.7 02/27/2013   HGB 12.2* 02/27/2013   HCT 37.4* 02/27/2013   PLT 125* 02/27/2013   GLUCOSE 118* 02/27/2013   CHOL  Value: 91        ATP III CLASSIFICATION:  <200     mg/dL   Desirable  409-811  mg/dL   Borderline High  >=914    mg/dL   High 7/82/9562   TRIG 44 06/18/2008   HDL 40 06/18/2008   LDLCALC  Value: 42        Total Cholesterol/HDL:CHD Risk Coronary Heart Disease Risk Table                     Men   Women  1/2 Average Risk   3.4   3.3 06/18/2008   ALKPHOS 78 02/27/2013   ALT 13 02/27/2013   AST 19 02/27/2013   NA 140 02/27/2013   K 4.1 02/27/2013   CL 102 02/27/2013   CREATININE 0.7 02/27/2013   BUN 14.6 02/27/2013   CO2 29 02/27/2013   INR 1.09 06/08/2011   RADIOLOGY:  I personally reviewed the following CT scan.  Ct Soft Tissue Neck W Contrast   Findings: Left neck dissection with resection of the left jugular  vein and sternocleidomastoid muscle. There is extensive radiation  change in the neck with extensive skin thickening and subcutaneous  edema throughout the anterior neck bilaterally. This is similar to  the prior study. There is also edema involving the pharynx and  larynx due to radiation change.  There is edema in the uvula,  unchanged. Epiglottis is normal.  Malignant node on the left has been resected. There is soft tissue  thickening throughout the left neck and surrounding the left  carotid artery compatible with surgical and radiation  edema/fibrosis. No well-defined mass or adenopathy is seen however  tissue planes are obscured by edema and fibrosis.  Lung apices are clear. No acute bony abnormality in the cervical  spine.   IMPRESSION:  No change from the prior study. Extensive postradiation and  postsurgical changes in the neck.    ASSESSMENT AND PLAN:   1. 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  He has not sign of recurrence on exam today.  He has not seen Dr. Ezzard Standing for about 1 year.  I referred him back to Dr Ezzard Standing for evaluation of the right jaw pain. I also refereed him back to Dr. Kristin Bruins to ensure no active osteonecrosis of the jaw.  2. Calorie protein malnutrition. He is on PEG tube feeding.  He has dysphagia.  I referred him to Dr. Ezzard Standing to see whether he has stricture that can be amenable for dilation.  3. Recent history of pancreatitis. He is completely off of alcohol at this time. It has not recurred recently.  4. Smoking: He has tried nicotine, Chantix, and electronic cigarettes without success at quitting.  He has been on Bupropion, Nicoderm and electronic cigarettes  5. Mild cytopenia: Most likely due to recent chemo and recent pancreatitis. I have low clinical suspicion for primary bone marrow process at this time. I'll continue to monitor his CBC.  6. Follow up:  In about 8 months with repeat CT  neck.    I informed Mr. Croghan that I am leaving the practice.  [facility] for him to see a new provider when he returns.   The length of time of the face-to-face encounter was 25 minutes. More than 50% of time was spent counseling and coordination of care.        Jadalee Westcott T. Gaylyn Rong, M.D.

## 2013-03-01 NOTE — Telephone Encounter (Signed)
lvm for pt regarding to Jan appt....advised pt cs will contact to sched ct....mailed pt appt sched and letter to pt

## 2013-03-13 ENCOUNTER — Encounter (HOSPITAL_COMMUNITY): Payer: Self-pay | Admitting: Dentistry

## 2013-03-14 ENCOUNTER — Telehealth: Payer: Self-pay | Admitting: *Deleted

## 2013-03-14 ENCOUNTER — Ambulatory Visit (HOSPITAL_COMMUNITY): Payer: Medicaid - Dental | Admitting: Dentistry

## 2013-03-14 ENCOUNTER — Encounter (HOSPITAL_COMMUNITY): Payer: Self-pay | Admitting: Dentistry

## 2013-03-14 VITALS — BP 134/71 | HR 55 | Temp 99.2°F

## 2013-03-14 DIAGNOSIS — Z463 Encounter for fitting and adjustment of dental prosthetic device: Secondary | ICD-10-CM

## 2013-03-14 DIAGNOSIS — K0820 Unspecified atrophy of edentulous alveolar ridge: Secondary | ICD-10-CM

## 2013-03-14 DIAGNOSIS — K117 Disturbances of salivary secretion: Secondary | ICD-10-CM

## 2013-03-14 DIAGNOSIS — R252 Cramp and spasm: Secondary | ICD-10-CM

## 2013-03-14 DIAGNOSIS — K08109 Complete loss of teeth, unspecified cause, unspecified class: Secondary | ICD-10-CM

## 2013-03-14 DIAGNOSIS — M264 Malocclusion, unspecified: Secondary | ICD-10-CM

## 2013-03-14 DIAGNOSIS — K1370 Unspecified lesions of oral mucosa: Secondary | ICD-10-CM

## 2013-03-14 NOTE — Patient Instructions (Signed)
Return to return to clinic as scheduled for dental evaluation. Patient to call if problems arise with the dentures before then. Patient keep dentures out if sore spots arise. Patient use salt water rinses as needed to aid healing.  Dr. Kristin Bruins

## 2013-03-14 NOTE — Telephone Encounter (Signed)
Call from pt's sister states [facility] asking for new Rx to change Tube feeding amount and/or rate.  Apparently pt not taking 7 cans per night and they need order to decrease to 5 cans per night or increase hours overnight?  Left VM for Vernell Leep to evaluate for proper orders.

## 2013-03-14 NOTE — Progress Notes (Signed)
03/14/2013   Patient:            Russell Williamson Date of Birth:  [birth year] MRN:                [id number]  BP 134/71  Pulse 55  Temp(Src) 99.2 F (37.3 C) (Oral)  Patient Active Problem List   Diagnosis Date Noted  . 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 Cervical adenopathy 04/26/2012  . Hiatal hernia   . Hx of radiation therapy   . Acute pancreatitis 10/16/2011  . 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 HTN (hypertension) 10/16/2011  . 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 Chronic pancreatitis   . Cancer 12/15/2010    Past Medical History  Diagnosis Date  . Hypertension   . Chronic pancreatitis   . Arthritis   . Hx of radiation therapy 02/02/11 to 03/22/11    L tonsil  . Seizures     alcohol-related  . Cervical adenopathy 04/26/2012  . Urinary hesitancy   . Constipation   . Cancer 12/15/10    left tonsillar squamous cell carcinoma  . Tonsillar cancer     tonsillar ca dx 12/27/10  . 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 Shortness of breath 05/23/2012    "lying down"  . History of blood transfusion ~ 2004    "from the pancreatitis"  . Daily headache 05/23/2012    "small ones"  . History of radiation therapy 02/02/2011-03/22/2011    head/neck,left tonsil  No Known Allergies  Current Outpatient Prescriptions  Medication Sig Dispense Refill  . Alum & Mag Hydroxide-Simeth (MAGIC MOUTHWASH W/LIDOCAINE) SOLN Take 5 mLs by mouth every 6 (six) hours as needed (Swish and Spit).  500 mL  0  . buPROPion (WELLBUTRIN XL) 150 MG 24 hr tablet Take 1 tablet (150 mg total) by mouth daily.  30 tablet  2  . butalbital-acetaminophen-caffeine (FIORICET, ESGIC) 50-325-40 MG per tablet Take 1 tablet by mouth daily as needed for headache.      . chlorhexidine (PERIDEX) 0.12 % solution Rinse with 15 MLS for 30 seconds 3 times daily after meals and at bedtime  480 mL  PRN  . Cholecalciferol (VITAMIN D PO) Take 1 tablet by mouth daily.      . fentaNYL (DURAGESIC - DOSED MCG/HR) 50 MCG/HR Place 1 patch  onto the skin every 3 (three) days.      . Lidocaine HCl 2 % SOLN Swish and spit 5 mLs every 6 (six) hours as needed (mouth sores).  500 mL  5  . lipase/protease/amylase (CREON) 12000 UNITS CPEP Take 1 capsule by mouth 4 (four) times daily.      . metoprolol (LOPRESSOR) 50 MG tablet Take 50 mg by mouth 2 (two) times daily.      . Nutritional Supplements (ENSURE PO) Take 1 Can by mouth 3 (three) times daily.      . Nutritional Supplements (OSMOLITE 1.5 CAL PO) Give 7 Cans by tube See admin instructions. 1 can=8 oz.  7 cans throughout the day      . Omega-3 Fatty Acids (FISH OIL PO) Take 1 capsule by mouth daily.      Marland Kitchen omeprazole (PRILOSEC) 40 MG capsule Take 40 mg by mouth daily.       Marland Kitchen oxyCODONE (ROXICODONE) 15 MG immediate release tablet Take 15 mg by mouth every 4 (four) hours.       . pentoxifylline (TRENTAL) 400 MG CR tablet Take 400 mg by mouth 3 (three) times daily with  meals.      . promethazine (PHENERGAN) 25 MG tablet Take 1 tablet (25 mg total) by mouth every 6 (six) hours as needed. For nausea  30 tablet  2  . temazepam (RESTORIL) 30 MG capsule Take 30 mg by mouth at bedtime as needed for sleep.       No current facility-administered medications for this visit.   Russell Williamson presents for periodic oral examination and evaluation of upper and lower complete dentures.  Patient was seen by Dr.Ha and found to be complaining of lower right jaw pain. Dr. Gaylyn Rong could not see any exposed bone or referred patient to dental medicine for evaluation to rule out osteoradionecrosis. Patient currently denies any significant dental pain associated with the right jaw today. Patient has been having some denture irritation to this area by his report. Patient also indicates that Dr. Ezzard Standing is planning a procedure for his dysphagia in the near future.  Exam: General: Patient is a well-developed, slightly built male in no acute distress.  Extraoral: No lymphadenopathy palpated. Patient denies pain in  the temporal mandibular joint or border of the mandible today. Intraoral: Patient would generalized erythema. Patient has xerostomia, radiation therapy. There is no evidence of exposed bone. No obvious denture irritation is noted. Dentition:  Patient is edentulous with atrophy of the edentulous alveolar ridges. Prosthodontic: Upper and lower complete dentures are stable and retentive consistent with the amount of bone remaining.  Pressure indicating paste applied and dentures were adjusted as needed. Dentures were polished.  Occlusion:  Occlusion was evaluated for maximum intercuspation and patient has acceptable occlusion in the maximum intercuspation position. Patient with tendency to protrude lower jaw into a Class III position.  Patient instructed on how to obtain maximum intercuspation position. Patient accepts results of the dentures adjustments today. We discussed position on the maxillary anterior teeth and may consider a maxillary lab reline procedure with adjustment of the maxillary teeth to the less of a class II malocclusion . Patient is interested in this lab reline procedure and will call when he wants this appointment to be scheduled. The patient is aware that dentures will need to be For him for 3-4 days to allow for this lab procedure.   Assessments: 1. Edentulous 2. atrophy of the edentulous alveolar ridges 3. Severe xerostomia-radiation induced. 4. History of generalized erythema of oral soft tissues. This has resolved almost completely with the hyperbaric oxygen therapy. 5. No evidence of exposed bone-today.  Plan: 1. Patient to use chlorhexidine rinses 3 times daily as instructed.  2. Patient also instructed to again try to stop smoking. 3. Return as scheduled for denture adjustment appointment. Call if problems arise before then. Keep dentures out if sore spots arise. Patient also may use salt water rinses as needed. 4. call when he wishes to proceed with maxillary lab  reline procedure with repositioning of the maxillary teeth as discussed.    Dr. Cindra Eves

## 2013-03-15 NOTE — Telephone Encounter (Signed)
Per Vernell Leep,  Contact [facility] for tube feeding recommendations.  Pt not under active treatment and not being followed by her at this time.  Called [facility] and asked to s/w Dietician.  Left VM for [facility] requesting call back to discuss tube feed orders.

## 2013-03-15 NOTE — Telephone Encounter (Signed)
S/w Montine Circle, Dietician, with [facility].  She will investigate/assess pt's tube feeding needs and contact us for further orders as needed.

## 2013-03-16 ENCOUNTER — Telehealth: Payer: Self-pay | Admitting: *Deleted

## 2013-03-16 NOTE — Telephone Encounter (Signed)
VM from [facility] Dietician states she has s/w pt's sister and they are going to increase pt's tube feeding rate to 125 ml/hr x 13 hrs overnight so pt will get 7 cans per day.  They do not need any new orders from Dr. Gaylyn Rong at this time.

## 2013-03-20 ENCOUNTER — Encounter (HOSPITAL_COMMUNITY): Payer: Self-pay | Admitting: Dentistry

## 2013-03-20 ENCOUNTER — Ambulatory Visit (HOSPITAL_COMMUNITY): Payer: Medicaid - Dental | Admitting: Dentistry

## 2013-03-20 VITALS — BP 123/74 | HR 65 | Temp 98.6°F

## 2013-03-20 DIAGNOSIS — Z463 Encounter for fitting and adjustment of dental prosthetic device: Secondary | ICD-10-CM

## 2013-03-20 DIAGNOSIS — K0820 Unspecified atrophy of edentulous alveolar ridge: Secondary | ICD-10-CM

## 2013-03-20 DIAGNOSIS — K117 Disturbances of salivary secretion: Secondary | ICD-10-CM

## 2013-03-20 DIAGNOSIS — M264 Malocclusion, unspecified: Secondary | ICD-10-CM

## 2013-03-20 NOTE — Progress Notes (Signed)
03/20/2013  Patient:            Russell Williamson Date of Birth:  20-Apr-1956 MRN:                [id number]   Russell Williamson presents for periodic oral examination and evaluation of upper and lower complete dentures.  Patient is complaining of his maxillary and mandibular dentures and the inability to chew properly. We discussed the plan for an attempt at adjusting the position of the maxillary anterior teeth and resetting the maxillary posterior teeth at a reduced vertical dimension to assist in the patient's ability to chew in light of his problem with trismus and decreased maximum opening.  The patient understands the risks, benefits and complications of the proposed treatment plan and agrees to proceed with this attempt to reposition the maxillary and anterior and posterior teeth appropriately. Patient is aware that ideally the patient needs a prosthodontist for evaluation for fabrication of a new set of upper and lower complete dentures. The patient refuses this option at this time.  Procedure: Maxillary complete denture lab reline procedure with Isofunctional compound.] Smile line and existing plane occlusion was marked on the maxillary denture. Mandibular complete denture impression was made and poured up in stone for the opposing model. Bite registration was obtained with Regisil. Impressions and models were sent to the dental lab for rebase and resetting of the maxillary teeth as indicated. Patient tolerated the procedure well. Patient to return to clinic in approximately 4-5 days for reinsertion of the upper complete denture.  Dr. Cindra Eves

## 2013-03-20 NOTE — Patient Instructions (Signed)
Return to clinic as scheduled for the insertion of the maxillary complete denture.  Dr. Kristin Bruins

## 2013-03-22 ENCOUNTER — Encounter (HOSPITAL_BASED_OUTPATIENT_CLINIC_OR_DEPARTMENT_OTHER): Payer: Self-pay | Admitting: *Deleted

## 2013-03-22 NOTE — Progress Notes (Signed)
Pt has a feeding tune abd-no trach Has to use transportation-to be here with sister 6am Bring meds

## 2013-03-22 NOTE — H&P (Signed)
PREOPERATIVE H&P  Chief Complaint: dysphagia  HPI: Russell Williamson is a 57 y.o. male who presents for evaluation of dysphagia following chemoradiation treatment of a T2N2 left tonsil cancer in April 2012 and subsequent left neck dissection in August of 2013 with 2 positive lymph nodes with extracapsular extension. He also has a persistent lesion on posterior lateral tongue on the left side. He's taken to the OR for esophageal dilation and excisional biopsy of the tongue lesion.   Past Medical History  Diagnosis Date  . Hypertension   . Chronic pancreatitis   . Arthritis   . Hx of radiation therapy 02/02/11 to 03/22/11    L tonsil  . Seizures     alcohol-related  . Cervical adenopathy 04/26/2012  . Urinary hesitancy   . Constipation   . Cancer 12/15/10    left tonsillar squamous cell carcinoma  . Tonsillar cancer     tonsillar ca dx 12/27/10  . 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 Shortness of breath 05/23/2012    "lying down"  . History of blood transfusion ~ 2004    "from the pancreatitis"  . Daily headache 05/23/2012    "small ones"  . History of radiation therapy 02/02/2011-03/22/2011    head/neck,left tonsil  . G tube feedings     has a feeding tube in stomach   Past Surgical History  Procedure Laterality Date  . Bile duct stent placement      hx of  . Radical neck dissection  05/23/2012    w/mass excision  . Cholecystectomy  04/2005  . Tibia fracture surgery  1990's    left  . Fracture surgery    . Direct laryngoscopy  05/23/2012    Procedure: DIRECT LARYNGOSCOPY;  Surgeon: [name], MD;  Location: [facility] OR;  Service: ENT;  Laterality: N/A;  . Mass biopsy  05/23/2012    Procedure: NECK MASS BIOPSY;  Surgeon: [name], MD;  Location: [facility] OR;  Service: ENT;  Laterality: N/A;  . Radical neck dissection  05/23/2012    Procedure: RADICAL NECK DISSECTION;  Surgeon: [name], MD;  Location: [facility] OR;  Service: ENT;  Laterality: Left;    History   Social History  . Marital Status: Single    Spouse Name: N/A    Number of Children: N/A  . Years of Education: N/A   Social History Main Topics  . Smoking status: Current Every Day Smoker -- 0.25 packs/day for 40 years    Types: Cigarettes  . Smokeless tobacco: Never Used     Comment: hx 1/2 -1 PPD  . Alcohol Use: No     Comment: 05/23/2012 hx alcohol abuse, quit ~ 2000  . Drug Use: Yes    Special: Marijuana     Comment: 05/23/2012 "used marijuana in my teens"  . Sexually Active: No   Other Topics Concern  . None   Social History Narrative  . None   Family History  Problem Relation Age of Onset  . COPD Mother    No Known Allergies Prior to Admission medications   Medication Sig Start Date End Date Taking? Authorizing Provider  Alum & Mag Hydroxide-Simeth (MAGIC MOUTHWASH W/LIDOCAINE) SOLN Take 5 mLs by mouth every 6 (six) hours as needed (Swish and Spit). 08/29/12   Myrtis Ser, NP  buPROPion (WELLBUTRIN XL) 150 MG 24 hr tablet Take 1 tablet (150 mg total) by mouth daily. 08/28/12   Myrtis Ser, NP  butalbital-acetaminophen-caffeine (FIORICET,  ESGIC) 50-325-40 MG per tablet Take 1 tablet by mouth daily as needed for headache.    Historical Provider, MD  chlorhexidine (PERIDEX) 0.12 % solution Rinse with 15 MLS for 30 seconds 3 times daily after meals and at bedtime 06/15/12 06/14/13  Charlynne Pander, DDS  Cholecalciferol (VITAMIN D PO) Take 1 tablet by mouth daily.    Historical Provider, MD  fentaNYL (DURAGESIC - DOSED MCG/HR) 50 MCG/HR Place 1 patch onto the skin every 3 (three) days.    Historical Provider, MD  Lidocaine HCl 2 % SOLN Swish and spit 5 mLs every 6 (six) hours as needed (mouth sores). 03/01/13   Exie Parody, MD  lipase/protease/amylase (CREON) 12000 UNITS CPEP Take 1 capsule by mouth 4 (four) times daily.    Historical Provider, MD  metoprolol (LOPRESSOR) 50 MG tablet Take 50 mg by mouth 2 (two) times daily.    Historical Provider, MD   Nutritional Supplements (ENSURE PO) Take 1 Can by mouth 3 (three) times daily.    Historical Provider, MD  Nutritional Supplements (OSMOLITE 1.5 CAL PO) Give 7 Cans by tube See admin instructions. 1 can=8 oz.  7 cans throughout the day    Historical Provider, MD  Omega-3 Fatty Acids (FISH OIL PO) Take 1 capsule by mouth daily.    Historical Provider, MD  omeprazole (PRILOSEC) 40 MG capsule Take 40 mg by mouth daily.     Historical Provider, MD  oxyCODONE (ROXICODONE) 15 MG immediate release tablet Take 15 mg by mouth every 4 (four) hours.     Historical Provider, MD  pentoxifylline (TRENTAL) 400 MG CR tablet Take 400 mg by mouth 3 (three) times daily with meals.    Historical Provider, MD  promethazine (PHENERGAN) 25 MG tablet Take 1 tablet (25 mg total) by mouth every 6 (six) hours as needed. For nausea 08/28/12   Myrtis Ser, NP  temazepam (RESTORIL) 30 MG capsule Take 30 mg by mouth at bedtime as needed for sleep.    Historical Provider, MD     Positive ROS: dysphagia with ba swallow showing slight upper esophageal narrowing  All other systems have been reviewed and were otherwise negative with the exception of those mentioned in the HPI and as above.  Physical Exam: There were no vitals filed for this visit.  General: Alert, no acute distress Oral: Normal oral mucosa. Slight ulceration of post. Left lateral tongue Nasal: Clear nasal passages Neck: No palpable adenopathy or thyroid nodules Ear: Ear canal is clear with normal appearing TMs Cardiovascular: Regular rate and rhythm, no murmur.  Respiratory: Clear to auscultation Neurologic: Alert and oriented x 3   Assessment/Plan: Dysphagia Plan for Procedure(s): DIRECT LARYNGOSCOPY ESOPHAGEAL DILATION LEFT LATERAL TONGUE BIOPSY   Dillard Cannon, MD 03/22/2013 4:05 PM

## 2013-03-23 ENCOUNTER — Ambulatory Visit (HOSPITAL_BASED_OUTPATIENT_CLINIC_OR_DEPARTMENT_OTHER): Payer: Medicaid Other | Admitting: Anesthesiology

## 2013-03-23 ENCOUNTER — Encounter (HOSPITAL_BASED_OUTPATIENT_CLINIC_OR_DEPARTMENT_OTHER)
Admission: RE | Disposition: A | Discharge status: Home / Self Care | Payer: Self-pay | Source: Ambulatory Visit | Attending: Otolaryngology

## 2013-03-23 ENCOUNTER — Ambulatory Visit (HOSPITAL_BASED_OUTPATIENT_CLINIC_OR_DEPARTMENT_OTHER)
Admission: RE | Admit: 2013-03-23 | Discharge: 2013-03-23 | Disposition: A | Discharge status: Home / Self Care | Payer: Medicaid Other | Source: Ambulatory Visit | Attending: Otolaryngology | Admitting: Otolaryngology

## 2013-03-23 ENCOUNTER — Encounter (HOSPITAL_BASED_OUTPATIENT_CLINIC_OR_DEPARTMENT_OTHER): Payer: Self-pay | Admitting: Anesthesiology

## 2013-03-23 ENCOUNTER — Encounter (HOSPITAL_BASED_OUTPATIENT_CLINIC_OR_DEPARTMENT_OTHER): Payer: Self-pay | Admitting: *Deleted

## 2013-03-23 DIAGNOSIS — R51 Headache: Secondary | ICD-10-CM | POA: Insufficient documentation

## 2013-03-23 DIAGNOSIS — Z923 Personal history of irradiation: Secondary | ICD-10-CM | POA: Insufficient documentation

## 2013-03-23 DIAGNOSIS — Z9221 Personal history of antineoplastic chemotherapy: Secondary | ICD-10-CM | POA: Insufficient documentation

## 2013-03-23 DIAGNOSIS — Z931 Gastrostomy status: Secondary | ICD-10-CM | POA: Insufficient documentation

## 2013-03-23 DIAGNOSIS — I10 Essential (primary) hypertension: Secondary | ICD-10-CM | POA: Insufficient documentation

## 2013-03-23 DIAGNOSIS — K861 Other chronic pancreatitis: Secondary | ICD-10-CM | POA: Insufficient documentation

## 2013-03-23 DIAGNOSIS — R1310 Dysphagia, unspecified: Secondary | ICD-10-CM | POA: Insufficient documentation

## 2013-03-23 DIAGNOSIS — Z85819 Personal history of malignant neoplasm of unspecified site of lip, oral cavity, and pharynx: Secondary | ICD-10-CM | POA: Insufficient documentation

## 2013-03-23 DIAGNOSIS — F17200 Nicotine dependence, unspecified, uncomplicated: Secondary | ICD-10-CM | POA: Insufficient documentation

## 2013-03-23 DIAGNOSIS — Z79899 Other long term (current) drug therapy: Secondary | ICD-10-CM | POA: Insufficient documentation

## 2013-03-23 DIAGNOSIS — D490 Neoplasm of unspecified behavior of digestive system: Secondary | ICD-10-CM | POA: Insufficient documentation

## 2013-03-23 HISTORY — DX: Gastrostomy status: Z93.1

## 2013-03-23 HISTORY — PX: DIRECT LARYNGOSCOPY: SHX5326

## 2013-03-23 HISTORY — PX: ESOPHAGEAL DILATION: SHX303

## 2013-03-23 SURGERY — LARYNGOSCOPY, DIRECT
Anesthesia: General | Site: Mouth | Wound class: Clean Contaminated

## 2013-03-23 MED ORDER — HYDROMORPHONE HCL PF 1 MG/ML IJ SOLN
0.2500 mg | INTRAMUSCULAR | Status: DC | PRN
  Administered 2013-03-23 (×3): 0.5 mg via INTRAVENOUS

## 2013-03-23 MED ORDER — PROPOFOL 10 MG/ML IV BOLUS
INTRAVENOUS | Status: DC | PRN
  Administered 2013-03-23: 200 mg via INTRAVENOUS
  Administered 2013-03-23: 20 mg via INTRAVENOUS

## 2013-03-23 MED ORDER — AMOXICILLIN 250 MG/5ML PO SUSR: 500.0000 mg | Freq: Two times a day (BID) | ORAL | Status: AC

## 2013-03-23 MED ORDER — FENTANYL CITRATE 0.05 MG/ML IJ SOLN
INTRAMUSCULAR | Status: DC | PRN
  Administered 2013-03-23 (×3): 50 ug via INTRAVENOUS

## 2013-03-23 MED ORDER — OXYCODONE HCL 5 MG PO TABS: 5.0000 mg | ORAL_TABLET | Freq: Once | ORAL | Status: AC | PRN

## 2013-03-23 MED ORDER — OXYCODONE HCL 5 MG/5ML PO SOLN
5.0000 mg | Freq: Once | ORAL | Status: AC | PRN
  Administered 2013-03-23: 5 mg via ORAL

## 2013-03-23 MED ORDER — LIDOCAINE HCL (CARDIAC) 20 MG/ML IV SOLN
INTRAVENOUS | Status: DC | PRN
  Administered 2013-03-23: 80 mg via INTRAVENOUS

## 2013-03-23 MED ORDER — ONDANSETRON HCL 4 MG/2ML IJ SOLN
INTRAMUSCULAR | Status: DC | PRN
  Administered 2013-03-23: 4 mg via INTRAVENOUS

## 2013-03-23 MED ORDER — LIDOCAINE-EPINEPHRINE (PF) 1 %-1:200000 IJ SOLN
INTRAMUSCULAR | Status: DC | PRN
  Administered 2013-03-23: 5 mL

## 2013-03-23 MED ORDER — SUCCINYLCHOLINE CHLORIDE 20 MG/ML IJ SOLN
INTRAMUSCULAR | Status: DC | PRN
  Administered 2013-03-23: 80 mg via INTRAVENOUS

## 2013-03-23 MED ORDER — LACTATED RINGERS IV SOLN
INTRAVENOUS | Status: DC
  Administered 2013-03-23 (×2): via INTRAVENOUS

## 2013-03-23 MED ORDER — MIDAZOLAM HCL 5 MG/5ML IJ SOLN
INTRAMUSCULAR | Status: DC | PRN
  Administered 2013-03-23: 2 mg via INTRAVENOUS

## 2013-03-23 MED ORDER — ONDANSETRON HCL 4 MG/2ML IJ SOLN: 4.0000 mg | Freq: Once | INTRAMUSCULAR | Status: DC | PRN

## 2013-03-23 MED ORDER — EPHEDRINE SULFATE 50 MG/ML IJ SOLN
INTRAMUSCULAR | Status: DC | PRN
  Administered 2013-03-23: 10 mg via INTRAVENOUS

## 2013-03-23 MED ORDER — MIDAZOLAM HCL 2 MG/2ML IJ SOLN: 1.0000 mg | INTRAMUSCULAR | Status: DC | PRN

## 2013-03-23 MED ORDER — DEXAMETHASONE SODIUM PHOSPHATE 4 MG/ML IJ SOLN
INTRAMUSCULAR | Status: DC | PRN
  Administered 2013-03-23: 10 mg via INTRAVENOUS

## 2013-03-23 MED ORDER — FENTANYL CITRATE 0.05 MG/ML IJ SOLN: 50.0000 ug | INTRAMUSCULAR | Status: DC | PRN

## 2013-03-23 SURGICAL SUPPLY — 36 items
CANISTER SUCTION 1200CC (MISCELLANEOUS) ×3 IMPLANT
CLEANER CAUTERY TIP 5X5 PAD (MISCELLANEOUS) IMPLANT
CLOTH BEACON ORANGE TIMEOUT ST (SAFETY) ×3 IMPLANT
CONT SPEC 4OZ CLIKSEAL STRL BL (MISCELLANEOUS) ×3 IMPLANT
CORDS BIPOLAR (ELECTRODE) IMPLANT
COVER MAYO STAND STRL (DRAPES) ×3 IMPLANT
ELECT COATED BLADE 2.86 ST (ELECTRODE) ×3 IMPLANT
ELECT REM PT RETURN 9FT ADLT (ELECTROSURGICAL) ×3
ELECTRODE REM PT RTRN 9FT ADLT (ELECTROSURGICAL) ×2 IMPLANT
GAUZE SPONGE 4X4 12PLY STRL LF (GAUZE/BANDAGES/DRESSINGS) ×6 IMPLANT
GLOVE BIO SURGEON STRL SZ 6.5 (GLOVE) ×6 IMPLANT
GLOVE SS BIOGEL STRL SZ 7.5 (GLOVE) ×2 IMPLANT
GLOVE SUPERSENSE BIOGEL SZ 7.5 (GLOVE) ×1
GOWN PREVENTION PLUS XLARGE (GOWN DISPOSABLE) ×3 IMPLANT
GOWN PREVENTION PLUS XXLARGE (GOWN DISPOSABLE) IMPLANT
GUARD TEETH (MISCELLANEOUS) IMPLANT
MARKER SKIN DUAL TIP RULER LAB (MISCELLANEOUS) IMPLANT
NDL SAFETY ECLIPSE 18X1.5 (NEEDLE) ×2 IMPLANT
NEEDLE HYPO 18GX1.5 BLUNT FILL (NEEDLE) ×3 IMPLANT
NEEDLE HYPO 18GX1.5 SHARP (NEEDLE) ×1
NEEDLE SPNL 22GX7 QUINCKE BK (NEEDLE) IMPLANT
NS IRRIG 1000ML POUR BTL (IV SOLUTION) ×3 IMPLANT
PAD CLEANER CAUTERY TIP 5X5 (MISCELLANEOUS)
PATTIES SURGICAL .5 X3 (DISPOSABLE) ×3 IMPLANT
PENCIL FOOT CONTROL (ELECTRODE) ×3 IMPLANT
SHEET MEDIUM DRAPE 40X70 STRL (DRAPES) ×3 IMPLANT
SLEEVE SCD COMPRESS KNEE MED (MISCELLANEOUS) ×3 IMPLANT
SOLUTION ANTI FOG 6CC (MISCELLANEOUS) IMPLANT
SOLUTION BUTLER CLEAR DIP (MISCELLANEOUS) ×3 IMPLANT
SURGILUBE 2OZ TUBE FLIPTOP (MISCELLANEOUS) ×6 IMPLANT
SUT CHROMIC 3 0 SH 27 (SUTURE) ×3 IMPLANT
SYR 5ML LL (SYRINGE) ×3 IMPLANT
SYR BULB 3OZ (MISCELLANEOUS) ×3 IMPLANT
SYR CONTROL 10ML LL (SYRINGE) ×3 IMPLANT
TOWEL OR 17X24 6PK STRL BLUE (TOWEL DISPOSABLE) ×3 IMPLANT
TUBE CONNECTING 20X1/4 (TUBING) ×6 IMPLANT

## 2013-03-23 NOTE — Transfer of Care (Signed)
Immediate Anesthesia Transfer of Care Note  Patient: Russell Williamson  Procedure(s) Performed: Procedure(s): DIRECT LARYNGOSCOPY (N/A) ESOPHAGEAL DILATION (N/A) LEFT LATERAL TONGUE BIOPSY (Left)  Patient Location: PACU  Anesthesia Type:General  Level of Consciousness: awake, sedated and patient cooperative  Airway & Oxygen Therapy: Patient Spontanous Breathing and aerosol face mask  Post-op Assessment: Report given to PACU RN and Post -op Vital signs reviewed and stable  Post vital signs: Reviewed and stable  Complications: No apparent anesthesia complications

## 2013-03-23 NOTE — Anesthesia Preprocedure Evaluation (Signed)
Anesthesia Evaluation  Patient identified by MRN, date of birth, ID band Patient awake    Reviewed: Allergy & Precautions, H&P , NPO status , Patient's Chart, lab work & pertinent test results, reviewed documented beta blocker date and time   Airway Mallampati: II TM Distance: >3 FB Neck ROM: Limited  Mouth opening: Limited Mouth Opening  Dental  (+) Edentulous Upper and Edentulous Lower   Pulmonary  breath sounds clear to auscultation        Cardiovascular hypertension, Pt. on medications and Pt. on home beta blockers Rhythm:Regular Rate:Normal     Neuro/Psych    GI/Hepatic   Endo/Other    Renal/GU      Musculoskeletal   Abdominal   Peds  Hematology   Anesthesia Other Findings   Reproductive/Obstetrics                           Anesthesia Physical Anesthesia Plan  ASA: III  Anesthesia Plan: General   Post-op Pain Management:    Induction: Intravenous  Airway Management Planned: Oral ETT  Additional Equipment:   Intra-op Plan:   Post-operative Plan: Extubation in OR  Informed Consent: I have reviewed the patients History and Physical, chart, labs and discussed the procedure including the risks, benefits and alternatives for the proposed anesthesia with the patient or authorized representative who has indicated his/her understanding and acceptance.   Dental advisory given  Plan Discussed with: CRNA, Anesthesiologist and Surgeon  Anesthesia Plan Comments:         Anesthesia Quick Evaluation

## 2013-03-23 NOTE — Anesthesia Postprocedure Evaluation (Signed)
  Anesthesia Post-op Note  Patient: Russell Williamson  Procedure(s) Performed: Procedure(s): DIRECT LARYNGOSCOPY (N/A) ESOPHAGEAL DILATION (N/A) LEFT LATERAL TONGUE BIOPSY (Left)  Patient Location: PACU  Anesthesia Type:General  Level of Consciousness: awake, alert  and oriented  Airway and Oxygen Therapy: Patient Spontanous Breathing  Post-op Pain: mild  Post-op Assessment: Post-op Vital signs reviewed  Post-op Vital Signs: Reviewed  Complications: No apparent anesthesia complications

## 2013-03-23 NOTE — Op Note (Signed)
NAME:  Russell Williamson, Russell Williamson            ACCOUNT NO.:  [id number]  MEDICAL RECORD NO.:  [id number]  LOCATION:  DMP                          FACILITY:  [facility]  PHYSICIAN:  Daphene Chisholm E. Dell Briner, M.D.DATE OF BIRTH:  06/13/1956  DATE OF PROCEDURE:  03/23/2013 DATE OF DISCHARGE:  03/23/2013                              OPERATIVE REPORT   PREOPERATIVE DIAGNOSIS:  Dysphagia with history of left tonsil cancer status post primary treatment with radiation and chemotherapy in April 2012.  The patient has had persistent dysphagia which has gotten worse with upper esophageal stenosis.  He has also had a sore on the left lateral tongue.  He was taken to the operating room at this time for a direct laryngoscopy and biopsy and esophageal dilation.  OPERATIONS PERFORMED: 1. Direct laryngoscopy and biopsy. 2. Esophageal dilation with Savary dilators, 10 mm to 18 mm.  SURGEON:  Suleika Donavan E. Alesha Jaffee, MD  ANESTHESIA:  General endotracheal.  COMPLICATIONS:  None.  BRIEF CLINICAL NOTE:  Russell Williamson is a 56-year-old gentleman who was initially diagnosed with a T2, N2 left tonsil cancer in April 2012. He underwent chemoradiation treatment as primary treatment.  He had persistent left neck disease and underwent a left neck dissection in August 2013.  At that time, had 2 positive lymph nodes with extracapsular extension.  He has done reasonably well since that time, but has been having some increasing dysphagia.  Barium swallow shows some upper esophageal narrowing.  He also has some persistent tenderness on the left posterior lateral tongue.  He was taken to the operating room this time for direct laryngoscopy and biopsy, and esophageal dilation.  DESCRIPTION OF PROCEDURE:  After adequate endotracheal anesthesia, first oral examination was performed.  The patient had some slight induration of the left lateral posterior tongue.  This area was injected with 2 mL of Xylocaine with epinephrine for local  anesthetic and then the elliptical biopsy was obtained.  Hemostasis was obtained with the cautery and the small defect was closed with 3 interrupted 3-0 chromic sutures.  Further direct laryngoscopy was performed down in the base of tongue, vallecula, epiglottis area.  There is some slight leukoplakia, but this was very superficial and soft.  The patient had a very rigid neck, making evaluation in the larynx a little bit difficult with the #6 endotracheal tube in place, but in no obvious disease noted.  Both piriform sinuses were clear.  Following this, the Savary guidewire was passed down the esophagus and dilation was initiated with the 10-mm Savary dilator, and was subsequently dilated up to #18 Savary dilator. Following dilation to a #18 mm Savary dilator, cervical esophagoscopy was performed and the upper cervical esophagus was clear.  There was a few little stretch marks and minor mucosal tears at the opening of the upper esophageal sphincter that was little stenotic.  This completed the procedure.  Russell Williamson was awoken from anesthesia and transferred to the recovery room, postop doing well.  DISPOSITION:  He was discharged home later this morning on amoxicillin suspension 5 mg b.i.d. for 1 week, Tylenol and hydrocodone elixir p.r.n. pain.  We will have him follow up in my office in 2 weeks for recheck.            ______________________________ Macil Crady E. Jaylie Neaves, M.D.     CEN/MEDQ  D:  03/23/2013  T:  03/23/2013  Job:  [id number]  cc:   Dr. Juan Ha 

## 2013-03-23 NOTE — Op Note (Deleted)
NAME:  Russell Williamson            ACCOUNT NO.:  [id number]  MEDICAL RECORD NO.:  [id number]  LOCATION:  DMP                          FACILITY:  [facility]  PHYSICIAN:  Kristine Garbe. Ezzard Standing, M.D.DATE OF BIRTH:  [birth year]  DATE OF PROCEDURE:  03/23/2013 DATE OF DISCHARGE:  03/23/2013                              OPERATIVE REPORT   PREOPERATIVE DIAGNOSIS:  Dysphagia with history of left tonsil cancer status post primary treatment with radiation and chemotherapy in April 2012.  The patient has had persistent dysphagia which has gotten worse with upper esophageal stenosis.  He has also had a sore on the left lateral tongue.  He was taken to the operating room at this time for a direct laryngoscopy and biopsy and esophageal dilation.  OPERATIONS PERFORMED: 1. Direct laryngoscopy and biopsy. 2. Esophageal dilation with Savary dilators, 10 mm to 18 mm.  SURGEON:  Kristine Garbe. Ezzard Standing, MD  ANESTHESIA:  General endotracheal.  COMPLICATIONS:  None.  BRIEF CLINICAL NOTE:  Russell Williamson is a 57 year old gentleman who was initially diagnosed with a T2, N2 left tonsil cancer in April 2012. He underwent chemoradiation treatment as primary treatment.  He had persistent left neck disease and underwent a left neck dissection in August 2013.  At that time, had 2 positive lymph nodes with extracapsular extension.  He has done reasonably well since that time, but has been having some increasing dysphagia.  Barium swallow shows some upper esophageal narrowing.  He also has some persistent tenderness on the left posterior lateral tongue.  He was taken to the operating room this time for direct laryngoscopy and biopsy, and esophageal dilation.  DESCRIPTION OF PROCEDURE:  After adequate endotracheal anesthesia, first oral examination was performed.  The patient had some slight induration of the left lateral posterior tongue.  This area was injected with 2 mL of Xylocaine with epinephrine for local  anesthetic and then the elliptical biopsy was obtained.  Hemostasis was obtained with the cautery and the small defect was closed with 3 interrupted 3-0 chromic sutures.  Further direct laryngoscopy was performed down in the base of tongue, vallecula, epiglottis area.  There is some slight leukoplakia, but this was very superficial and soft.  The patient had a very rigid neck, making evaluation in the larynx a little bit difficult with the #6 endotracheal tube in place, but in no obvious disease noted.  Both piriform sinuses were clear.  Following this, the Savary guidewire was passed down the esophagus and dilation was initiated with the 10-mm Savary dilator, and was subsequently dilated up to #18 Savary dilator. Following dilation to a #18 mm Savary dilator, cervical esophagoscopy was performed and the upper cervical esophagus was clear.  There was a few little stretch marks and minor mucosal tears at the opening of the upper esophageal sphincter that was little stenotic.  This completed the procedure.  Russell Williamson was awoken from anesthesia and transferred to the recovery room, postop doing well.  DISPOSITION:  He was discharged home later this morning on amoxicillin suspension 5 mg b.i.d. for 1 week, Tylenol and hydrocodone elixir p.r.n. pain.  We will have him follow up in my office in 2 weeks for recheck.  ______________________________ Kristine Garbe Ezzard Standing, M.D.     CEN/MEDQ  D:  03/23/2013  T:  03/23/2013  Job:  [id number]  cc:   Dr. Cherre Huger

## 2013-03-23 NOTE — Interval H&P Note (Signed)
History and Physical Interval Note:  03/23/2013 7:28 AM  Russell Williamson  has presented today for surgery, with the diagnosis of Dysphagia  The various methods of treatment have been discussed with the patient and family. After consideration of risks, benefits and other options for treatment, the patient has consented to  Procedure(s): DIRECT LARYNGOSCOPY (N/A) ESOPHAGEAL DILATION (N/A) LEFT LATERAL TONGUE BIOPSY (Left) as a surgical intervention .  The patient's history has been reviewed, patient examined, no change in status, stable for surgery.  I have reviewed the patient's chart and labs.  Questions were answered to the patient's satisfaction.     NEWMAN, CHRISTOPHER

## 2013-03-23 NOTE — Brief Op Note (Signed)
03/23/2013  9:00 AM  PATIENT:  Russell Williamson  57 y.o. male  PRE-OPERATIVE DIAGNOSIS:  Dysphagia  POST-OPERATIVE DIAGNOSIS:  dysphagia  PROCEDURE:  Procedure(s): DIRECT LARYNGOSCOPY (N/A) ESOPHAGEAL DILATION (N/A) LEFT LATERAL TONGUE BIOPSY (Left)  SURGEON:  Surgeon(s) and Role:    * [name], MD - Primary  PHYSICIAN ASSISTANT:   ASSISTANTS: none   ANESTHESIA:   general  EBL:     BLOOD ADMINISTERED:none  DRAINS: none   LOCAL MEDICATIONS USED:  XYLOCAINE   SPECIMEN:  Source of Specimen:  left lateral tongue  DISPOSITION OF SPECIMEN:  PATHOLOGY  COUNTS:  YES  TOURNIQUET:  * No tourniquets in log *  DICTATION: .Other Dictation: Dictation Number [id number]  PLAN OF CARE: Discharge to home after PACU  PATIENT DISPOSITION:  PACU - hemodynamically stable.   Delay start of Pharmacological VTE agent (>24hrs) due to surgical blood loss or risk of bleeding: not applicable

## 2013-03-23 NOTE — Anesthesia Procedure Notes (Signed)
Date/Time: 03/23/2013 7:44 AM Performed by: Gar Gibbon Pre-anesthesia Checklist: Patient identified, Emergency Drugs available, Suction available and Patient being monitored Patient Re-evaluated:Patient Re-evaluated prior to inductionOxygen Delivery Method: Circle system utilized Preoxygenation: Pre-oxygenation with 100% oxygen Intubation Type: IV induction Ventilation: Mask ventilation without difficulty Grade View: Grade IV Tube type: Oral Tube size: 6.0 mm Number of attempts: 1 Airway Equipment and Method: Video-laryngoscopy Placement Confirmation: ETT inserted through vocal cords under direct vision,  positive ETCO2 and breath sounds checked- equal and bilateral Secured at: 22 cm Tube secured with: Tape Dental Injury: Teeth and Oropharynx as per pre-operative assessment  Difficulty Due To: Difficulty was anticipated and Difficult Airway-  due to edematous airway Future Recommendations: Recommend- induction with short-acting agent, and alternative techniques readily available

## 2013-03-26 ENCOUNTER — Encounter (HOSPITAL_COMMUNITY): Payer: Self-pay | Admitting: Dentistry

## 2013-03-26 ENCOUNTER — Encounter (HOSPITAL_BASED_OUTPATIENT_CLINIC_OR_DEPARTMENT_OTHER): Payer: Self-pay | Admitting: Otolaryngology

## 2013-04-03 ENCOUNTER — Ambulatory Visit (HOSPITAL_COMMUNITY): Payer: Medicaid - Dental | Admitting: Dentistry

## 2013-04-03 DIAGNOSIS — K1370 Unspecified lesions of oral mucosa: Secondary | ICD-10-CM

## 2013-04-03 DIAGNOSIS — M278 Other specified diseases of jaws: Secondary | ICD-10-CM

## 2013-04-03 NOTE — Progress Notes (Signed)
04/03/2013  Patient:            Russell Williamson Date of Birth:  1955-10-13 MRN:                [id number]  BP 124/89  Pulse 63  Temp(Src) 98.6 F (37 C) (Oral)  [name] presents for insertion of upper denture reline and repair. Patient had previous surgery with Dr. Ezzard Standing for his dysphagia two weeks ago. Patient indicates that his swallowing has improved. Exam: Patient is edentulous. Severe xerostomia is noted There is now evidence of exposed bone involving the mandibular left lingual in the area of #30 with 31 measuring approximately 3 mm around.  There also is an area of the maxillary left Lingual alveolar ridge area numbers 2 with 2-3 mm of exposed bone. These areas of exposed bone are new by patient report. Procedure: Pressure indicating paste applied to dentures. Adjustments made as needed. [name]. Occlusion evaluated and adjustments made as needed for Centric Relation and protrusive strokes. Good esthetics, phonetics, fit, and function noted. Patient accepts results. Post op instructions provided in written and verbal formats on use and care of dentures. Patient is to avoid wearing his upper and lower complete dentures for approximately one week. Patient is to use his chlorhexidine rinses 2-3 times a day to aid healing and allow for healing of the exposed bone. Patient is to also use salt water rinses as needed to aid healing. Return to clinic as scheduled for denture adjustment.  Patient did not want to schedule for early next week. Patient was scheduled instead for 2 weeks.  The patient is to call if problems arise before then. Patient dismissed in stable condition.  Charlynne Pander, DDS

## 2013-04-03 NOTE — Patient Instructions (Addendum)
Post op instructions provided in written and verbal formats on use and care of dentures. Patient is to avoid wearing his upper and lower complete dentures for approximately one week. Patient is to use his chlorhexidine rinses 2-3 times a day to aid healing and allow for healing of the exposed bone. Patient is to also use salt water rinses as needed to aid healing. Return to clinic as scheduled for denture adjustment.   The patient is to call if problems arise before then.

## 2013-04-17 ENCOUNTER — Encounter (HOSPITAL_COMMUNITY): Payer: Self-pay | Admitting: Dentistry

## 2013-04-19 ENCOUNTER — Ambulatory Visit: Payer: Medicaid Other | Admitting: Radiation Oncology

## 2013-04-19 ENCOUNTER — Ambulatory Visit
Admission: RE | Admit: 2013-04-19 | Discharge: 2013-04-19 | Disposition: A | Discharge status: Home / Self Care | Payer: Medicaid Other | Source: Ambulatory Visit | Attending: Radiation Oncology | Admitting: Radiation Oncology

## 2013-04-19 VITALS — BP 110/68 | HR 56 | Temp 98.4°F | Ht 66.0 in | Wt 128.8 lb

## 2013-04-19 DIAGNOSIS — C099 Malignant neoplasm of tonsil, unspecified: Secondary | ICD-10-CM

## 2013-04-19 NOTE — Progress Notes (Signed)
Russell Williamson here for follow up after treatment to his head and neck for cancer of the left tonsil.  He does have pain in his jaw that he is rating at a 5/10.  He is currently taking oxycodone 15 mg four times a day and also has a fentanyl patch.  He states they are not helping with the pain and are not strong enough.  He has noticed that his jaw is swelling especially when he has been talking.  His saliva is thick.  On inspection, he has a white sore on the left side of his tongue.  He is swallowing some foods like oatmeal and beef broth.  He last saw the dentist a couple of weeks ago.  He is taking in 3 cans of ensure and 7 cans of osmolite through his feeding tube per day.  He denies problems with his hearing.  He is fatigued.

## 2013-04-19 NOTE — Progress Notes (Addendum)
Radiation Oncology         [phone] ________________________________  Name: Russell Williamson MRN: [id number]  Date: 04/19/2013  DOB: 11/13/1955  Follow-Up Visit Note  CC: [name], MD  [name], *  Diagnosis:   Squamous cell carcinoma of the left tonsil  Interval Since Last Radiation:  2 years   Narrative:  The patient returns today for followup. The patient was not on my schedule today but he states that he came in because of some ongoing pain. He may have had an appointment previously which was canceled. I was able to work him in for a brief visit today. He continues to have some pain which he describes variably in the jaw area or possibly within the face/oral cavity region. Today he rates this pain as a 5/10. The pain is being managed with fentanyl as well as immediate release oxycodone through pain management. The patient has been seen by dentistry recently with no exposed bone or other major issues in terms of oral cavity healing. He does have severe xerostomia. He has been having some dysphasia and had esophageal dilatation recently through Dr. Ezzard Standing. A biopsy of a suspicious area within the oral cavity returned negative for malignancy. The patient does describe as well some aching pain in the upper neck bilaterally on occasion. The size and these areas does help.                              ALLERGIES:  has No Known Allergies.  Meds: Current Outpatient Prescriptions  Medication Sig Dispense Refill  . Alum & Mag Hydroxide-Simeth (MAGIC MOUTHWASH W/LIDOCAINE) SOLN Take 5 mLs by mouth every 6 (six) hours as needed (Swish and Spit).  500 mL  0  . butalbital-acetaminophen-caffeine (FIORICET, ESGIC) 50-325-40 MG per tablet Take 1 tablet by mouth daily as needed for headache.      . chlorhexidine (PERIDEX) 0.12 % solution Rinse with 15 MLS for 30 seconds 3 times daily after meals and at bedtime  480 mL  PRN  . Cholecalciferol (VITAMIN D PO) Take 1 tablet by mouth  daily.      . fentaNYL (DURAGESIC - DOSED MCG/HR) 50 MCG/HR Place 1 patch onto the skin every 3 (three) days.      . Lidocaine HCl 2 % SOLN Swish and spit 5 mLs every 6 (six) hours as needed (mouth sores).  500 mL  5  . lipase/protease/amylase (CREON) 12000 UNITS CPEP Take 1 capsule by mouth 4 (four) times daily.      . metoprolol (LOPRESSOR) 50 MG tablet Take 50 mg by mouth 2 (two) times daily.      . Nutritional Supplements (ENSURE PO) Take 1 Can by mouth 3 (three) times daily.      . Nutritional Supplements (OSMOLITE 1.5 CAL PO) Give 7 Cans by tube See admin instructions. 1 can=8 oz.  7 cans throughout the day      . Omega-3 Fatty Acids (FISH OIL PO) Take 1 capsule by mouth daily.      Marland Kitchen omeprazole (PRILOSEC) 40 MG capsule Take 40 mg by mouth daily.       Marland Kitchen oxyCODONE (ROXICODONE) 15 MG immediate release tablet Take 15 mg by mouth every 4 (four) hours.       . pentoxifylline (TRENTAL) 400 MG CR tablet Take 400 mg by mouth 3 (three) times daily with meals.      . promethazine (PHENERGAN) 25 MG tablet Take  1 tablet (25 mg total) by mouth every 6 (six) hours as needed. For nausea  30 tablet  2  . temazepam (RESTORIL) 30 MG capsule Take 30 mg by mouth at bedtime as needed for sleep.       No current facility-administered medications for this encounter.    Physical Findings: The patient is in no acute distress. Patient is alert and oriented.  height is 5\' 6"  (1.676 m) and weight is 128 lb 12.8 oz (58.423 kg). His temperature is 98.4 F (36.9 C). His blood pressure is 110/68 and his pulse is 56. His oxygen saturation is 92%. .   The oral cavity is very dry with no significant saliva. No intraoral lesions seen and no suspicious findings within the tonsil region/oropharynx. The patient's neck shows significant fibrosis with some limited range of motion especially turning to the right. No focal nodularity or masses. The patient has some edema in the submental region.  Lab Findings: Lab Results    Component Value Date   WBC 5.7 02/27/2013   HGB 12.2* 02/27/2013   HCT 37.4* 02/27/2013   MCV 104.0* 02/27/2013   PLT 125* 02/27/2013     Radiographic Findings: No results found.  Impression/Plan:    The patient is having some significant ongoing pain and this has been a chronic issue for him. He is having this managed in pain management and I did not make any changes. The patient wanted something "stronger" for pain and we discussed his current regimen today and how there is not anything that can easily be given which will be superior at this time in terms of pain management. Any dosing changes will be made through pain management. From my conversation with him it does not sound like he is doing much in terms of management of his dry mouth. I discussed using Biotene with him with some folks fine quite helpful. He is going to give this a try. I would also like for the patient to be seen again by physical therapy given some limitations in the neck. The patient states that he has been doing some exercises which he received through speech therapy and I'm not sure how compliant he has been, and he was seen by them quite some time ago.  I spent 15 minutes with the patient today, the majority of which was spent counseling the patient on the diagnosis of cancer and coordinating care.   Radene Gunning, M.D., Ph.D.

## 2013-04-25 ENCOUNTER — Ambulatory Visit (HOSPITAL_COMMUNITY): Payer: Medicaid - Dental | Admitting: Dentistry

## 2013-04-25 ENCOUNTER — Encounter (HOSPITAL_COMMUNITY): Payer: Self-pay | Admitting: Dentistry

## 2013-04-25 VITALS — BP 109/72 | HR 61 | Temp 98.8°F

## 2013-04-25 DIAGNOSIS — K062 Gingival and edentulous alveolar ridge lesions associated with trauma: Secondary | ICD-10-CM

## 2013-04-25 DIAGNOSIS — K0820 Unspecified atrophy of edentulous alveolar ridge: Secondary | ICD-10-CM

## 2013-04-25 DIAGNOSIS — Z463 Encounter for fitting and adjustment of dental prosthetic device: Secondary | ICD-10-CM

## 2013-04-25 NOTE — Progress Notes (Signed)
04/25/2013  Patient:            Russell Williamson Date of Birth:  08-Sep-1956 MRN:                [id number]  BP 109/72  Pulse 61  Temp(Src) 98.8 F (37.1 C) (Oral)  [name] presents for adjustment of recently inserted upper complete denture reline and repair.  Patient had a broken appointments 2 weeks after insertion and now presents one month after insertion for evaluation.   SUBJECTIVE:  patient is complaining of denture dictation to the lower right lingual area. This is in the molar area.  OBJECTIVE: Patient has generalized xerostomia. Patient is edentulous. The patient has denture irritation involving the lower right lingual area of the molars.  There is evidence of exposed bone involving the mandibular  right  lingual in the area of #30 with 31 measuring approximately 2 mm around.    Procedure: Pressure indicating paste applied to dentures. Adjustments made as needed. [name]. Occlusion evaluated and adjustments made as needed for Centric Relation and protrusive strokes. Patient accepts results. Patient is to  minimize wearing his upper and lower complete dentures for approximately one week. Patient is to use his chlorhexidine rinses 3 times a day to aid healing and allow for healing of the exposed bone. Patient is to also use salt water rinses as needed to aid healing. Return to clinic as scheduled for denture adjustment.  Patient did not want to schedule for early next week. Patient was scheduled instead for 2 weeks.  The patient is to call if problems arise before then. Patient dismissed in stable condition.  Charlynne Pander, DDS

## 2013-04-25 NOTE — Patient Instructions (Signed)
Patient is to  minimize wearing his upper and lower complete dentures for approximately one week. Patient is to use his chlorhexidine rinses 3 times a day to aid healing and allow for healing of the exposed bone. Patient is to also use salt water rinses as needed to aid healing. Return to clinic as scheduled for denture adjustment.    The patient is to call if problems arise before then.

## 2013-04-26 ENCOUNTER — Ambulatory Visit: Payer: Medicaid Other | Admitting: Radiation Oncology

## 2013-04-26 DIAGNOSIS — M264 Malocclusion, unspecified: Secondary | ICD-10-CM

## 2013-04-26 DIAGNOSIS — K1370 Unspecified lesions of oral mucosa: Secondary | ICD-10-CM

## 2013-04-26 DIAGNOSIS — K117 Disturbances of salivary secretion: Secondary | ICD-10-CM

## 2013-05-09 ENCOUNTER — Ambulatory Visit (HOSPITAL_COMMUNITY): Payer: Medicaid - Dental | Admitting: Dentistry

## 2013-05-09 ENCOUNTER — Encounter (HOSPITAL_COMMUNITY): Payer: Self-pay | Admitting: Dentistry

## 2013-05-09 VITALS — BP 114/70 | HR 54 | Temp 97.9°F

## 2013-05-09 DIAGNOSIS — K08109 Complete loss of teeth, unspecified cause, unspecified class: Secondary | ICD-10-CM

## 2013-05-09 DIAGNOSIS — Z463 Encounter for fitting and adjustment of dental prosthetic device: Secondary | ICD-10-CM

## 2013-05-09 DIAGNOSIS — K0820 Unspecified atrophy of edentulous alveolar ridge: Secondary | ICD-10-CM

## 2013-05-09 NOTE — Patient Instructions (Addendum)
Patient is to continue use of chlorhexidine rinses 3 times a day. Patient is to also use salt water rinses as needed to aid healing. Return to clinic as scheduled for denture adjustment.   Patient was scheduled to return in 2 weeks.  The patient is to call if problems arise before then. Dr. Kristin Bruins  Instructions for Denture Use and Care  Congratulations, you are on the way to oral rehabilitation!  You have just received a new set of complete or partial dentures.  These prostheses will help to improve both your appearance and chewing ability.  These instructions will help you get adjusted to your dentures as well as care for them properly.  Please read these instructions carefully and completely as soon as you get home.  If you or your caregiver have any questions please notify the [facility] at [phone].  HOW YOUR DENTURES LOOK AND FEEL Soon after you begin wearing your dentures, you may feel that your dentures are too large or even loose.  As our mouth and facial muscles become accustomed to the dentures, these feelings will go away.  You also may feel that you are salivating more than you normally do.  This feeling should go away as you get used to having the dentures in your mouth.  You may bite your cheek or your tongue; this will eventually resolve itself as you wear your dentures.  Some soreness is to be expected, but you should not hurt.  If your mouth hurts, call your dentist.  A denture adhesive may occasionally be necessary to hold your dentures in place more securely.  The dentist will let you know when one is recommended for you.  SPEAKING Wearing dentures will change the sound of your voice initially.  This will be noticed by you more than anyone else.  Bite and swallow before you speak, in order to place your dentures in position so that you may speak more clearly.  Practice speaking by reading aloud or counting from 1 to 100 very slowly and distinctly.  After some  practice your mouth will become accustomed to your dentures and you will speak more clearly.  EATING Chewing will definitely be different after you receive your dentures.  With a little practice and patience you should be able to eat just about any kind of food.  Begin by eating small quantities of food that are cut into small pieces.  Star with soft foods such as eggs, cooked vegetables, or puddings.  As you gain confidence advance  Your diet to whatever texture foods you can tolerate.  DENTURE CARE Dentures can collect plaque and calculus much the same as natural teeth can.  If not removed on a regular basis, your dentures will not look or feel clean, and you will experience denture odor.  It is very important that you remove your dentures at bedtime and clean them thoroughly.  You should: 1. Clean your dentures over a sink full of water so if dropped, breakage will be prevented. 2. Rinse your dentures with cool water to remove any large food particles. 3. Use soap and water or a denture cleanser or paste to clean the dentures.  Do not use regular toothpaste as it may abrade the denture base or teeth. 4. Use a moistened denture brush to clean all surfaces (inside and outside). 5. Rinse thoroughly to remove any remaining soap or denture cleanser. 6. Use a soft bristle toothbrush to gently brush any natural teeth, gums, tongue, and  palate at bedtime and before reinserting your dentures. 7. Do not sleep with your dentures in your mouth at night.  Remove your dentures and soak them overnight in a denture cup filled with water or denture solution as recommended by your dentist.  This routine will become second nature and will increase the life and comfort of your dentures.  Please do not try to adjust these dentures yourself; you could damage them.  FOLLOW-UP You should call or make an appointment with your dentist.  Your dentist would like to see you at least once a year for a check-up and  examination.

## 2013-05-09 NOTE — Progress Notes (Signed)
05/09/2013  Patient:            Russell Williamson Date of Birth:  November 11, 1955 MRN:                [id number]  BP 114/70  Pulse 54  Temp(Src) 97.9 F (36.6 C) (Oral)   [name] presents for evaluation and adjustment of upper and lower complete dentures.   SUBJECTIVE: Patient is complaining of slight irritation to the lower right lingual area. This is in the molar area.  OBJECTIVE: Patient has generalized xerostomia. Patient is edentulous. There is no evidence of denture irritation.   There is NO evidence of exposed bone involving the mandibular right lingual in the area of #30 with 31.   Procedure: Pressure indicating paste applied to dentures. Adjustments made as needed. [name]. Occlusion evaluated and adjustments made as needed for Centric Relation and protrusive strokes. Patient accepts results. Patient is to continue use of chlorhexidine rinses 3 times a day. Patient is to also use salt water rinses as needed to aid healing. Return to clinic as scheduled for denture adjustment.   Patient was scheduled to return in 2 weeks.  The patient is to call if problems arise before then. Patient dismissed in stable condition.  Charlynne Pander, DDS

## 2013-05-18 ENCOUNTER — Encounter: Payer: Self-pay | Admitting: Oncology

## 2013-05-18 ENCOUNTER — Other Ambulatory Visit: Payer: Self-pay | Admitting: Radiation Oncology

## 2013-05-18 ENCOUNTER — Ambulatory Visit (HOSPITAL_COMMUNITY)
Admission: RE | Admit: 2013-05-18 | Discharge: 2013-05-18 | Disposition: A | Discharge status: Home / Self Care | Payer: Medicaid Other | Source: Ambulatory Visit | Attending: Radiation Oncology | Admitting: Radiation Oncology

## 2013-05-18 ENCOUNTER — Telehealth: Payer: Self-pay | Admitting: Oncology

## 2013-05-18 DIAGNOSIS — Z431 Encounter for attention to gastrostomy: Secondary | ICD-10-CM | POA: Insufficient documentation

## 2013-05-18 DIAGNOSIS — C099 Malignant neoplasm of tonsil, unspecified: Secondary | ICD-10-CM

## 2013-05-18 MED ORDER — HYDROMORPHONE HCL PF 2 MG/ML IJ SOLN: 2.0000 mg | Freq: Once | INTRAMUSCULAR | Status: AC

## 2013-05-18 MED ORDER — HYDROMORPHONE HCL PF 2 MG/ML IJ SOLN
INTRAMUSCULAR | Status: AC
  Administered 2013-05-18: 2 mg via INTRAMUSCULAR
  Filled 2013-05-18: qty 1

## 2013-05-18 MED ORDER — IOHEXOL 300 MG/ML  SOLN
10.0000 mL | Freq: Once | INTRAMUSCULAR | Status: AC | PRN
  Administered 2013-05-18: 10 mL

## 2013-05-18 MED ORDER — LIDOCAINE VISCOUS 2 % MT SOLN
15.0000 mL | Freq: Once | OROMUCOSAL | Status: AC
  Administered 2013-05-18: 15 mL via OROMUCOSAL
  Filled 2013-05-18: qty 15

## 2013-05-18 NOTE — Telephone Encounter (Signed)
Received a call from Carole Civil regarding her brother, Russell Williamson.  She stated that "he woke up this morning and his boxers were soaked."  He thinks that he has a hole in his peg tube.  Asked Dr. Michell Heinrich if we could have him seen today by Interventional Radiology.  She agreed.  Called Tina in IR and he can be seen if he comes in right away.  Called Dorothy back and she will arrange transportation for him to get here as soon as possible.  Notified Tina in IR.

## 2013-05-23 ENCOUNTER — Ambulatory Visit (HOSPITAL_COMMUNITY): Payer: Medicaid - Dental | Admitting: Dentistry

## 2013-05-23 ENCOUNTER — Encounter (HOSPITAL_COMMUNITY): Payer: Self-pay | Admitting: Dentistry

## 2013-05-23 VITALS — BP 128/69 | HR 53 | Temp 98.2°F

## 2013-05-23 DIAGNOSIS — Z463 Encounter for fitting and adjustment of dental prosthetic device: Secondary | ICD-10-CM

## 2013-05-23 DIAGNOSIS — K0820 Unspecified atrophy of edentulous alveolar ridge: Secondary | ICD-10-CM

## 2013-05-23 DIAGNOSIS — K117 Disturbances of salivary secretion: Secondary | ICD-10-CM

## 2013-05-23 NOTE — Progress Notes (Signed)
05/23/2013  Patient:            Russell Williamson Date of Birth:  10-Apr-1956 MRN:                [id number]  BP 128/69  Pulse 53  Temp(Src) 98.2 F (36.8 C) (Oral)   [name] presents for evaluation and adjustment of upper and lower complete dentures.   SUBJECTIVE: Patient is complaining of slight irritation to the lower left  Buccal extension. No problems with lower right side.  OBJECTIVE: Patient has generalized xerostomia. Patient is edentulous. There is no evidence of denture irritation.   There is NO evidence of exposed bone involving the mandibular right lingual in the area of #30 with 31. A denture irritation noted involving the lower left buccal extension.   Procedure: Pressure indicating paste applied to dentures. Minimal adjustments made as needed. [name]. Occlusion evaluated and no adjustments needed for Centric Relation and protrusive strokes. Patient accepts results. Patient is to continue use of chlorhexidine rinses 3 times a day. Patient is to also use salt water rinses as needed to aid healing. Patient to continue trismus exercises. Return to clinic as scheduled for denture adjustment.   The patient is to call if problems arise before then. Patient to keep dentures out if sore spots arise.  Charlynne Pander, DDS

## 2013-05-23 NOTE — Patient Instructions (Addendum)
Patient is to continue use of chlorhexidine rinses 3 times a day. Patient is to also use salt water rinses as needed to aid healing. Patient to continue trismus exercises. Return to clinic as scheduled for denture adjustment.   The patient is to call if problems arise before then. Patient to keep dentures out if sore spots arise.  Charlynne Pander, DDS

## 2013-08-21 ENCOUNTER — Ambulatory Visit (HOSPITAL_COMMUNITY): Payer: Self-pay | Admitting: Dentistry

## 2013-08-22 NOTE — Progress Notes (Signed)
This encounter was created in error - please disregard. Patient no show.

## 2013-08-27 ENCOUNTER — Other Ambulatory Visit: Payer: Self-pay | Admitting: *Deleted

## 2013-08-27 DIAGNOSIS — C099 Malignant neoplasm of tonsil, unspecified: Secondary | ICD-10-CM

## 2013-08-27 DIAGNOSIS — K1230 Oral mucositis (ulcerative), unspecified: Secondary | ICD-10-CM

## 2013-08-27 MED ORDER — LIDOCAINE VISCOUS HCL 2 % MT SOLN: 5.0000 mL | Freq: Four times a day (QID) | OROMUCOSAL | Status: DC | PRN

## 2013-09-03 ENCOUNTER — Encounter (HOSPITAL_COMMUNITY): Payer: Self-pay | Admitting: Dentistry

## 2013-09-11 ENCOUNTER — Telehealth: Payer: Self-pay | Admitting: *Deleted

## 2013-09-11 NOTE — Telephone Encounter (Signed)
sw the pt informed the pt that the arrival time had changed. gv appt for 11/02/13 for ov@ 12:30p. Pt is aware.Russell Williamson

## 2013-09-12 ENCOUNTER — Encounter (HOSPITAL_COMMUNITY): Payer: Self-pay | Admitting: Dentistry

## 2013-09-12 ENCOUNTER — Ambulatory Visit (HOSPITAL_COMMUNITY): Payer: Medicaid - Dental | Admitting: Dentistry

## 2013-09-12 ENCOUNTER — Encounter (INDEPENDENT_AMBULATORY_CARE_PROVIDER_SITE_OTHER): Payer: Self-pay

## 2013-09-12 VITALS — BP 115/72 | HR 84 | Temp 99.0°F

## 2013-09-12 DIAGNOSIS — M278 Other specified diseases of jaws: Secondary | ICD-10-CM

## 2013-09-12 DIAGNOSIS — K062 Gingival and edentulous alveolar ridge lesions associated with trauma: Secondary | ICD-10-CM

## 2013-09-12 DIAGNOSIS — Z463 Encounter for fitting and adjustment of dental prosthetic device: Secondary | ICD-10-CM

## 2013-09-12 DIAGNOSIS — K08109 Complete loss of teeth, unspecified cause, unspecified class: Secondary | ICD-10-CM

## 2013-09-12 DIAGNOSIS — K0820 Unspecified atrophy of edentulous alveolar ridge: Secondary | ICD-10-CM

## 2013-09-12 MED ORDER — CHLORHEXIDINE GLUCONATE 0.12 % MT SOLN: OROMUCOSAL | Status: DC

## 2013-09-12 NOTE — Progress Notes (Signed)
09/12/2013   Patient:            Russell Williamson Date of Birth:  [birth year] MRN:                [id number]  BP 115/72  Pulse 84  Temp(Src) 99 F (37.2 C) (Oral)  Russell Williamson presents for evaluation of denture irritation of the upper right quadrant.  The patient was last seen on 05/24/2003 team for adjustment of his upper lower complete dentures. No exposed bone was noted at that time. The patient then had broken appointments for 08/21/2013 and 09/03/2013. The patient now presents for evaluation for denture irritation and adjustment of dentures as needed.  S: Patient is complaining of upper right denture irritation.  Patient has not been wearing his dentures very often by report. Patient is still smoking by report. Patient was again encouraged to stop smoking.   Exam: Patient with generalized erythema to oral tissues consistent with continued smoking and xerostomia. The patient is edentulous. There is atrophy of edentulous alveolar ridges. Patient now has exposed bone involving the upper right maxillary alveolar ridge in the area of tooth #1-2. This measures 8 mm x 9 mm round. The exposed bone is above the level of the gum line. The patient has worsening trismus symptoms. The maximum interincisal opening is now measured at 27 mm.  The patient indicates that he still able to get his upper and lower complete dentures in. However, he is having significant difficulty eating and chewing. The patient was previously seen physical therapy for trismus exercises. The patient was instructed to continue the exercises in physical therapy team recommended. Patient is aware that if trismus gets worse, he may not be able to get his dentures in at all.  Procedure: Pressure indicating paste was applied to the upper and lower complete dentures. Adjustments were made as needed. Minimal adjustment was needed on the lower denture. The upper denture was adjusted primarily and the maxillary anterior area. The  dentures were then polished. The occlusion was evaluated and no adjustments were needed.  Assessments: 1. Exposed bone of the upper right maxillary alveolar ridge. 2. Xerostomia 3. Atrophy of edentulous alveolar ridge 4. The patient is edentulous.  We discussed proceeding partial ostectomy to assist in healing of the exposed bone. Patient agrees to the procedure today and accepts the risks, benefits, and complications of the procedure. Procedure: Topical anesthetic to the area. Partial ostectomy performed with rongeurs and bone file. Good heme from bone noted. Area was irrigated with copious sterile saline followed by chlorhexidine rinse post operatively. This removal of bone should assist in allowing tissues to cover the area of the exposed bone. Patient instructed to keep dentures out for one to two additional weeks. Patient also use chlorhexidine rinses 3 times daily as instructed.  Patient also instructed to stop smoking. Patient also to use salt water rinses as needed. Patient was instructed to continue trismus exercises as per physical therapy. Return to clinic as scheduled for re-evaluation of this area. Patient is to call if problems worsen. Patient refused appointment for evaluation of denture next week. Patient agreed to appointment in January of 2015.  Patient dismissed in stable condition. Patient tolerated the procedure well.   Dr. Cindra Eves

## 2013-09-12 NOTE — Patient Instructions (Addendum)
The patient was instructed to keep dentures out for one to two weeks. Patient also to use chlorhexidine rinses 3 times daily as instructed. A prescription was sent to [facility] pharmacy. Patient also instructed to stop smoking. Patient also to use salt water rinses as needed. Patient was instructed to perform trismus exercises as per his previous physical therapy instructions. Return to clinic as scheduled for re-evaluation of this area. Patient is to call if problems worsen. Pateint dismissed in stable condition. Patient tolerated the procedure well.  Dr. Kristin Bruins

## 2013-10-10 ENCOUNTER — Ambulatory Visit (HOSPITAL_COMMUNITY): Payer: Medicaid - Dental | Admitting: Dentistry

## 2013-10-10 ENCOUNTER — Encounter (HOSPITAL_COMMUNITY): Payer: Self-pay | Admitting: Dentistry

## 2013-10-10 VITALS — BP 132/80 | HR 63 | Temp 97.6°F

## 2013-10-10 DIAGNOSIS — Y842 Radiological procedure and radiotherapy as the cause of abnormal reaction of the patient, or of later complication, without mention of misadventure at the time of the procedure: Secondary | ICD-10-CM

## 2013-10-10 DIAGNOSIS — K117 Disturbances of salivary secretion: Secondary | ICD-10-CM

## 2013-10-10 DIAGNOSIS — K0820 Unspecified atrophy of edentulous alveolar ridge: Secondary | ICD-10-CM

## 2013-10-10 DIAGNOSIS — R682 Dry mouth, unspecified: Secondary | ICD-10-CM

## 2013-10-10 DIAGNOSIS — K08109 Complete loss of teeth, unspecified cause, unspecified class: Secondary | ICD-10-CM

## 2013-10-10 DIAGNOSIS — M278 Other specified diseases of jaws: Secondary | ICD-10-CM

## 2013-10-10 DIAGNOSIS — Z972 Presence of dental prosthetic device (complete) (partial): Secondary | ICD-10-CM

## 2013-10-10 DIAGNOSIS — K000 Anodontia: Principal | ICD-10-CM

## 2013-10-10 DIAGNOSIS — M272 Inflammatory conditions of jaws: Secondary | ICD-10-CM

## 2013-10-10 NOTE — Progress Notes (Signed)
10/10/2013   Patient:            Russell Williamson Date of Birth:  04-29-1956 MRN:                [id number]  BP 132/80  Pulse 63  Temp(Src) 97.6 F (36.4 C) (Oral)  [name] presents for re-evaluation of exposed bone of the upper right quadrant.  The patient was last seen on 09/11/2013 for partial ostectomy and adjustment of his upper and lower complete dentures. Area of exposed bone measured 8 mm X 58mm at that time.   S: Patient is not complaining of upper right denture irritation.  Patient has not been wearing his dentures very often by report. Patient is still smoking by report. Patient was again encouraged to stop smoking.    Exam: Patient with generalized erythema to oral tissues consistent with continued smoking and xerostomia. The patient is edentulous. There is atrophy of edentulous alveolar ridges. Patient has persistent exposed bone involving the upper right maxillary alveolar ridge in the area of tooth #1-2 that has decreased in size from 8x9 mm to 5 x 7 mm round. The exposed bone is still slightly above the level of the gum line. The patient has persistent trismus symptoms. The maximum interincisal opening is now measured at 28 mm.  The patient indicates that he still able to get his upper and lower complete dentures in. However, he is using the dentures minimally. The patient was previously seen physical therapy for trismus exercises. The patient was instructed to continue the exercises in physical therapy team recommended. Patient is again aware that if trismus gets worse, he may not be able to get his dentures in at all. Pressure indicating paste was applied to the upper and lower complete dentures. No adjustments were needed.   Assessments: 1. Persistent exposed bone of the upper right maxillary alveolar ridge. 2. Xerostomia 3. Atrophy of edentulous alveolar ridge 4. The patient is edentulous.  We discussed proceeding partial ostectomy to assist in healing of the  exposed bone. Patient agrees to the procedure today and accepts the risks, benefits, and complications of the procedure. Procedure: Topical anesthetic to the area. Partial ostectomy performed with rongeurs and bone file. Good heme from bone noted. Area was irrigated with copious sterile saline followed by chlorhexidine rinse post operatively. This removal of bone should assist in allowing tissues to cover the area of the exposed bone. Patient instructed to keep dentures out for one to two additional weeks. Patient also use chlorhexidine rinses 3 times daily as instructed.  Patient also instructed to stop smoking. Patient also to use salt water rinses as needed. Patient was instructed to continue trismus exercises as per physical therapy. Return to clinic as scheduled for re-evaluation of this area. Patient is to call if problems worsen.  Patient dismissed in stable condition. Patient tolerated the procedure well.  Dr. Teena Dunk

## 2013-10-10 NOTE — Patient Instructions (Signed)
Patient instructed to keep dentures out for one to two additional weeks. Patient also use chlorhexidine rinses 3 times daily as instructed.  Patient also instructed to stop smoking. Patient also to use salt water rinses as needed. Patient was instructed to continue trismus exercises as per physical therapy. Return to clinic as scheduled for re-evaluation of this area. Patient is to call if problems worsen.  Dr. Enrique Sack

## 2013-10-29 ENCOUNTER — Ambulatory Visit (HOSPITAL_COMMUNITY)
Admission: RE | Admit: 2013-10-29 | Discharge: 2013-10-29 | Disposition: A | Discharge status: Home / Self Care | Payer: Medicaid Other | Source: Ambulatory Visit | Attending: Oncology | Admitting: Oncology

## 2013-10-29 ENCOUNTER — Encounter: Payer: Self-pay | Admitting: Hematology and Oncology

## 2013-10-29 ENCOUNTER — Encounter (HOSPITAL_COMMUNITY): Payer: Self-pay

## 2013-10-29 ENCOUNTER — Other Ambulatory Visit: Payer: Self-pay | Admitting: Hematology and Oncology

## 2013-10-29 ENCOUNTER — Other Ambulatory Visit (HOSPITAL_BASED_OUTPATIENT_CLINIC_OR_DEPARTMENT_OTHER): Payer: Medicaid Other

## 2013-10-29 DIAGNOSIS — I658 Occlusion and stenosis of other precerebral arteries: Secondary | ICD-10-CM | POA: Insufficient documentation

## 2013-10-29 DIAGNOSIS — Z85819 Personal history of malignant neoplasm of unspecified site of lip, oral cavity, and pharynx: Secondary | ICD-10-CM | POA: Insufficient documentation

## 2013-10-29 DIAGNOSIS — E039 Hypothyroidism, unspecified: Secondary | ICD-10-CM

## 2013-10-29 DIAGNOSIS — C099 Malignant neoplasm of tonsil, unspecified: Secondary | ICD-10-CM

## 2013-10-29 DIAGNOSIS — M6289 Other specified disorders of muscle: Secondary | ICD-10-CM | POA: Insufficient documentation

## 2013-10-29 DIAGNOSIS — Z923 Personal history of irradiation: Secondary | ICD-10-CM | POA: Insufficient documentation

## 2013-10-29 DIAGNOSIS — R1310 Dysphagia, unspecified: Secondary | ICD-10-CM | POA: Insufficient documentation

## 2013-10-29 DIAGNOSIS — M6250 Muscle wasting and atrophy, not elsewhere classified, unspecified site: Secondary | ICD-10-CM | POA: Insufficient documentation

## 2013-10-29 DIAGNOSIS — K1230 Oral mucositis (ulcerative), unspecified: Secondary | ICD-10-CM

## 2013-10-29 DIAGNOSIS — I6529 Occlusion and stenosis of unspecified carotid artery: Secondary | ICD-10-CM | POA: Insufficient documentation

## 2013-10-29 DIAGNOSIS — Z9221 Personal history of antineoplastic chemotherapy: Secondary | ICD-10-CM | POA: Insufficient documentation

## 2013-10-29 DIAGNOSIS — M47812 Spondylosis without myelopathy or radiculopathy, cervical region: Secondary | ICD-10-CM | POA: Insufficient documentation

## 2013-10-29 LAB — COMPREHENSIVE METABOLIC PANEL (CC13)
ALT: 20 U/L (ref 0–55)
ANION GAP: 11 meq/L (ref 3–11)
AST: 24 U/L (ref 5–34)
Albumin: 4.1 g/dL (ref 3.5–5.0)
Alkaline Phosphatase: 84 U/L (ref 40–150)
BILIRUBIN TOTAL: 0.39 mg/dL (ref 0.20–1.20)
BUN: 12.8 mg/dL (ref 7.0–26.0)
CALCIUM: 9.8 mg/dL (ref 8.4–10.4)
CHLORIDE: 101 meq/L (ref 98–109)
CO2: 28 meq/L (ref 22–29)
Creatinine: 0.8 mg/dL (ref 0.7–1.3)
Glucose: 115 mg/dl (ref 70–140)
Potassium: 4.1 mEq/L (ref 3.5–5.1)
SODIUM: 140 meq/L (ref 136–145)
TOTAL PROTEIN: 8.1 g/dL (ref 6.4–8.3)

## 2013-10-29 LAB — TSH CHCC: TSH: 8.56 m(IU)/L — ABNORMAL HIGH (ref 0.320–4.118)

## 2013-10-29 LAB — CBC WITH DIFFERENTIAL/PLATELET
BASO%: 0.4 % (ref 0.0–2.0)
Basophils Absolute: 0 10*3/uL (ref 0.0–0.1)
EOS%: 3 % (ref 0.0–7.0)
Eosinophils Absolute: 0.1 10*3/uL (ref 0.0–0.5)
HCT: 41.4 % (ref 38.4–49.9)
HGB: 13.6 g/dL (ref 13.0–17.1)
LYMPH#: 0.9 10*3/uL (ref 0.9–3.3)
LYMPH%: 25.9 % (ref 14.0–49.0)
MCH: 33.9 pg — ABNORMAL HIGH (ref 27.2–33.4)
MCHC: 32.8 g/dL (ref 32.0–36.0)
MCV: 103.5 fL — ABNORMAL HIGH (ref 79.3–98.0)
MONO#: 0.8 10*3/uL (ref 0.1–0.9)
MONO%: 24.1 % — ABNORMAL HIGH (ref 0.0–14.0)
NEUT#: 1.6 10*3/uL (ref 1.5–6.5)
NEUT%: 46.6 % (ref 39.0–75.0)
Platelets: 140 10*3/uL (ref 140–400)
RBC: 4 10*6/uL — AB (ref 4.20–5.82)
RDW: 13.7 % (ref 11.0–14.6)
WBC: 3.5 10*3/uL — AB (ref 4.0–10.3)

## 2013-10-29 LAB — T4, FREE: Free T4: 0.9 ng/dL (ref 0.80–1.80)

## 2013-10-29 MED ORDER — IOHEXOL 300 MG/ML  SOLN
100.0000 mL | Freq: Once | INTRAMUSCULAR | Status: AC | PRN
  Administered 2013-10-29: 100 mL via INTRAVENOUS

## 2013-10-31 ENCOUNTER — Encounter (HOSPITAL_COMMUNITY): Payer: Self-pay | Admitting: Dentistry

## 2013-10-31 ENCOUNTER — Encounter (INDEPENDENT_AMBULATORY_CARE_PROVIDER_SITE_OTHER): Payer: Self-pay

## 2013-10-31 ENCOUNTER — Ambulatory Visit (HOSPITAL_COMMUNITY): Payer: Medicaid - Dental | Admitting: Dentistry

## 2013-10-31 VITALS — BP 120/74 | HR 64 | Temp 98.3°F

## 2013-10-31 DIAGNOSIS — K0889 Other specified disorders of teeth and supporting structures: Secondary | ICD-10-CM

## 2013-10-31 DIAGNOSIS — R682 Dry mouth, unspecified: Secondary | ICD-10-CM

## 2013-10-31 DIAGNOSIS — R252 Cramp and spasm: Secondary | ICD-10-CM

## 2013-10-31 DIAGNOSIS — K000 Anodontia: Secondary | ICD-10-CM

## 2013-10-31 DIAGNOSIS — K0820 Unspecified atrophy of edentulous alveolar ridge: Secondary | ICD-10-CM

## 2013-10-31 DIAGNOSIS — Z972 Presence of dental prosthetic device (complete) (partial): Secondary | ICD-10-CM

## 2013-10-31 DIAGNOSIS — K08109 Complete loss of teeth, unspecified cause, unspecified class: Secondary | ICD-10-CM

## 2013-10-31 DIAGNOSIS — K117 Disturbances of salivary secretion: Secondary | ICD-10-CM

## 2013-10-31 DIAGNOSIS — M272 Inflammatory conditions of jaws: Secondary | ICD-10-CM

## 2013-10-31 DIAGNOSIS — Y842 Radiological procedure and radiotherapy as the cause of abnormal reaction of the patient, or of later complication, without mention of misadventure at the time of the procedure: Secondary | ICD-10-CM

## 2013-10-31 NOTE — Patient Instructions (Signed)
Patient instructed to keep dentures out for one to two additional weeks. Patient also use chlorhexidine rinses 3 times daily as instructed.  Patient also instructed to stop smoking. Patient also to use salt water rinses as needed. Patient was instructed to continue trismus exercises as per physical therapy. Return to clinic as scheduled for re-evaluation of this area. Patient is to call if problems worsen.

## 2013-10-31 NOTE — Progress Notes (Signed)
10/31/2013   Patient:            Russell Williamson Date of Birth:  October 12, 1955 MRN:                [id number]  BP 120/74  Pulse 64  Temp(Src) 98.3 F (36.8 C) (Oral)  [name] presents for re-evaluation of exposed bone of the upper right quadrant.  The patient was last seen on 10/10/2013 for partial ostectomy and adjustment of his upper and lower complete dentures.  Area of exposed bone measured 5 mm X 7 mm at that time.   S: Patient is not complaining of upper right denture irritation.  Patient has not been wearing his dentures very often by report. Patient is still smoking by report. Patient was again encouraged to stop smoking.    Exam: Patient with generalized erythema to oral tissues consistent with continued smoking and xerostomia. The patient is edentulous. There is atrophy of edentulous alveolar ridges. Patient has persistent exposed bone involving the upper right maxillary alveolar ridge in the area of tooth #1-2 that has decreased SIGNIFICANTLY in size from 5 x 7 mm round. To less than 2 mm.  The exposed bone is still slightly above the level of the gum line and appears to be mobile. The patient has persistent trismus symptoms. The maximum interincisal opening is now measured at 29 mm.  The patient indicates that he still able to get his upper and lower complete dentures in. However, he is using the dentures minimally. The patient was previously seen physical therapy for trismus exercises. The patient was instructed to continue the exercises in physical therapy team recommended. Patient is again aware that if trismus gets worse, he may not be able to get his dentures in at all. Pressure indicating paste was applied to the upper and lower complete dentures. No adjustments were needed.   Assessments: 1. Persistent exposed bone of the upper right maxillary alveolar ridge now only 11mm 2. Xerostomia 3. Atrophy of edentulous alveolar ridge 4. The patient is edentulous.  We  discussed proceeding partial ostectomy to assist in healing of the exposed bone. Patient agrees to the procedure today and accepts the risks, benefits, and complications of the procedure. Procedure: Topical anesthetic to the area. Partial ostectomy performed with rongeurs and bone file. Small 2 mm sequestrum removed. This appears to be the last exposed bone noted. Good heme from bone noted. Area was irrigated with copious sterile saline followed by chlorhexidine rinse post operatively. This removal of bone should assist in allowing tissues to completely cover the area of the exposed bone. Patient instructed to keep dentures out for one to two additional weeks. Patient also use chlorhexidine rinses 3 times daily as instructed.  Patient also instructed to stop smoking. Patient also to use salt water rinses as needed. Patient was instructed to continue trismus exercises as per physical therapy. Return to clinic as scheduled for re-evaluation of this area. Patient is to call if problems worsen.  Patient dismissed in stable condition. Patient tolerated the procedure well.  Dr. Teena Dunk

## 2013-11-02 ENCOUNTER — Encounter: Payer: Self-pay | Admitting: Hematology and Oncology

## 2013-11-02 ENCOUNTER — Telehealth: Payer: Self-pay | Admitting: Hematology and Oncology

## 2013-11-02 ENCOUNTER — Other Ambulatory Visit: Payer: Self-pay | Admitting: Lab

## 2013-11-02 ENCOUNTER — Ambulatory Visit (HOSPITAL_BASED_OUTPATIENT_CLINIC_OR_DEPARTMENT_OTHER): Payer: Medicaid Other | Admitting: Hematology and Oncology

## 2013-11-02 VITALS — BP 119/68 | HR 61 | Temp 98.3°F | Resp 20 | Ht 66.0 in | Wt 130.2 lb

## 2013-11-02 DIAGNOSIS — G8929 Other chronic pain: Secondary | ICD-10-CM

## 2013-11-02 DIAGNOSIS — E039 Hypothyroidism, unspecified: Secondary | ICD-10-CM

## 2013-11-02 DIAGNOSIS — C099 Malignant neoplasm of tonsil, unspecified: Secondary | ICD-10-CM

## 2013-11-02 DIAGNOSIS — C779 Secondary and unspecified malignant neoplasm of lymph node, unspecified: Secondary | ICD-10-CM

## 2013-11-02 DIAGNOSIS — M8708 Idiopathic aseptic necrosis of bone, other site: Secondary | ICD-10-CM

## 2013-11-02 DIAGNOSIS — B370 Candidal stomatitis: Secondary | ICD-10-CM | POA: Insufficient documentation

## 2013-11-02 DIAGNOSIS — F17200 Nicotine dependence, unspecified, uncomplicated: Secondary | ICD-10-CM

## 2013-11-02 HISTORY — DX: Candidal stomatitis: B37.0

## 2013-11-02 MED ORDER — FLUCONAZOLE 100 MG PO TABS: 100.0000 mg | ORAL_TABLET | Freq: Every day | ORAL | Status: DC

## 2013-11-02 MED ORDER — LEVOTHYROXINE SODIUM 25 MCG PO TABS: 25.0000 ug | ORAL_TABLET | Freq: Every day | ORAL | Status: DC

## 2013-11-02 NOTE — Progress Notes (Signed)
[place] OFFICE PROGRESS NOTE  Patient Care Team: Harvie Junior as PCP - General (Specialist) Rozetta Nunnery, MD (Otolaryngology) Marye Round, MD (Radiation Oncology) Nobie Putnam, MD (Hematology and Oncology) Lenn Cal, DDS as Consulting Physician (Dentistry)  DIAGNOSIS: Left tonsil squamous cell carcinoma T2, N2, M0  SUMMARY OF ONCOLOGIC HISTORY: This is a pleasant 58 year old patient was found to have abnormal looking tonsil in 2012 by his dentist. 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 He underwent concurrent chemoradiation therapy with weekly cisplatin from 02/01/2011 through 03/22/2011. Chemotherapy was discontinued early due to 2 prolonged pancytopenia. In 2013, he was noted to have enlarging lymphadenopathy. 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 Last imaging study from 10/29/2013 showed no evidence of disease recurrence  INTERVAL HISTORY: Falcon Mccaskey 58 y.o. male returns for further followup. He is still dependent on feeding tube for nutritional supplement. He is taking 7 cans of nutritional supplement a day. He is able to swallow some pills and liquids. He continued to smoke about half a pack of cigarettes a day. In the past he attempted to quit smoking but he stated electronic cigarettes, Wellbutrin and Nicoderm were not helpful. 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 He also has difficulties with pain around the PEG tube site in his mouth limiting his ability to eat. He is dependent on fentanyl patch and oxycodone for pain management.  I have reviewed the past medical history, past surgical history, social history and family history with the patient and they are  unchanged from previous note.  ALLERGIES:  has No Known Allergies.  MEDICATIONS:  Current Outpatient Prescriptions  Medication Sig Dispense Refill  . Alum & Mag Hydroxide-Simeth (MAGIC MOUTHWASH W/LIDOCAINE) SOLN Take 5 mLs by mouth every 6 (six) hours as needed (Swish and Spit).  500 mL  0  . butalbital-acetaminophen-caffeine (FIORICET, ESGIC) 50-325-40 MG per tablet Take 1 tablet by mouth daily as needed for headache.      . chlorhexidine (PERIDEX) 0.12 % solution Rinse with 15 mls three times daily for 30 seconds each. Use after breakfast, lunch, and at bedtime. Spit out excess. Do not swallow.  1440 mL  3  . Cholecalciferol (VITAMIN D PO) Take 1 tablet by mouth daily.      . fentaNYL (DURAGESIC - DOSED MCG/HR) 50 MCG/HR Place 1 patch onto the skin every 3 (three) days.      . Lidocaine HCl 2 % SOLN Swish and spit 5 mLs every 6 (six) hours as needed (mouth sores).  500 mL  2  . lipase/protease/amylase (CREON) 12000 UNITS CPEP Take 1 capsule by mouth 4 (four) times daily.      . metoprolol (LOPRESSOR) 50 MG tablet Take 50 mg by mouth 2 (two) times daily.      . Nutritional Supplements (ENSURE PO) Take 1 Can by mouth 3 (three) times daily.      . Nutritional Supplements (OSMOLITE 1.5 CAL PO) Give 7 Cans by tube See admin instructions. 1 can=8 oz.  7 cans throughout the day      . Omega-3 Fatty Acids (FISH OIL PO) Take 1 capsule by mouth daily.      Marland Kitchen omeprazole (PRILOSEC) 40 MG capsule Take 40 mg by mouth daily.       Marland Kitchen  oxyCODONE (ROXICODONE) 15 MG immediate release tablet Take 15 mg by mouth every 4 (four) hours.       . promethazine (PHENERGAN) 25 MG tablet Take 1 tablet (25 mg total) by mouth every 6 (six) hours as needed. For nausea  30 tablet  2  . temazepam (RESTORIL) 30 MG capsule Take 30 mg by mouth at bedtime as needed for sleep.      . fluconazole (DIFLUCAN) 100 MG tablet Take 1 tablet (100 mg total) by mouth daily.  7 tablet  0  . levothyroxine (LEVOTHROID) 25 MCG tablet Take 1  tablet (25 mcg total) by mouth daily before breakfast.  30 tablet  2   No current facility-administered medications for this visit.    REVIEW OF SYSTEMS:   Constitutional: Denies fevers, chills or abnormal weight loss Eyes: Denies blurriness of vision Ears, nose, mouth, throat, and face: Have persistent jaw pain with difficulty swallowing food Respiratory: Denies cough, dyspnea or wheezes Cardiovascular: Denies palpitation, chest discomfort or lower extremity swelling Gastrointestinal:  Denies nausea, heartburn or change in bowel habits Skin: Denies abnormal skin rashes Lymphatics: Denies new lymphadenopathy or easy bruising Neurological:Denies numbness, tingling or new weaknesses Behavioral/Psych: Mood is stable, no new changes  All other systems were reviewed with the patient and are negative.  PHYSICAL EXAMINATION: ECOG PERFORMANCE STATUS: 1 - Symptomatic but completely ambulatory  Filed Vitals:   11/02/13 1235  BP: 119/68  Pulse: 61  Temp: 98.3 F (36.8 C)  Resp: 20   Filed Weights   11/02/13 1235  Weight: 130 lb 3.2 oz (59.058 kg)    GENERAL:alert, no distress and comfortable. He looks thin but not cachectic SKIN: skin color, texture, turgor are normal, no rashes or significant lesions EYES: normal, Conjunctiva are pink and non-injected, sclera clear OROPHARYNX:no exudate, evidence of abnormal mass in the left buccal mucosa. There were evidence of oral thrush NECK: Thick fibrotic changes from radiation and surgery changes LYMPH:  no palpable lymphadenopathy in the cervical, axillary or inguinal LUNGS: clear to auscultation and percussion with normal breathing effort HEART: regular rate & rhythm and no murmurs and no lower extremity edema ABDOMEN:abdomen soft, non-tender and normal bowel sounds. Feeding tube site looks okay Musculoskeletal:no cyanosis of digits and no clubbing  NEURO: alert & oriented x 3 with fluent speech, no focal motor/sensory  deficits  LABORATORY DATA:  I have reviewed the data as listed    Component Value Date/Time   NA 140 10/29/2013 1118   NA 139 05/22/2012 1031   NA 140 04/24/2012 1200   K 4.1 10/29/2013 1118   K 4.5 05/22/2012 1031   K 4.1 04/24/2012 1200   CL 102 02/27/2013 1045   CL 102 05/22/2012 1031   CL 98 04/24/2012 1200   CO2 28 10/29/2013 1118   CO2 28 05/22/2012 1031   CO2 30 04/24/2012 1200   GLUCOSE 115 10/29/2013 1118   GLUCOSE 118* 02/27/2013 1045   GLUCOSE 95 05/22/2012 1031   GLUCOSE 102 04/24/2012 1200   BUN 12.8 10/29/2013 1118   BUN 11 05/22/2012 1031   BUN 13 04/24/2012 1200   CREATININE 0.8 10/29/2013 1118   CREATININE 0.63 05/22/2012 1031   CREATININE 0.6 04/24/2012 1200   CALCIUM 9.8 10/29/2013 1118   CALCIUM 9.8 05/22/2012 1031   CALCIUM 9.6 04/24/2012 1200   PROT 8.1 10/29/2013 1118   PROT 8.1 04/24/2012 1200   PROT 7.2 10/17/2011 0405   ALBUMIN 4.1 10/29/2013 1118   ALBUMIN 3.3* 10/17/2011 0405  AST 24 10/29/2013 1118   AST 30 04/24/2012 1200   AST 18 10/17/2011 0405   ALT 20 10/29/2013 1118   ALT 27 04/24/2012 1200   ALT 15 10/17/2011 0405   ALKPHOS 84 10/29/2013 1118   ALKPHOS 77 04/24/2012 1200   ALKPHOS 76 10/17/2011 0405   BILITOT 0.39 10/29/2013 1118   BILITOT 0.40 04/24/2012 1200   BILITOT 0.4 10/17/2011 0405   GFRNONAA >90 05/22/2012 1031   GFRAA >90 05/22/2012 1031    No results found for this basename: SPEP, UPEP,  kappa and lambda light chains    Lab Results  Component Value Date   WBC 3.5* 10/29/2013   NEUTROABS 1.6 10/29/2013   HGB 13.6 10/29/2013   HCT 41.4 10/29/2013   MCV 103.5* 10/29/2013   PLT 140 10/29/2013      Chemistry      Component Value Date/Time   NA 140 10/29/2013 1118   NA 139 05/22/2012 1031   NA 140 04/24/2012 1200   K 4.1 10/29/2013 1118   K 4.5 05/22/2012 1031   K 4.1 04/24/2012 1200   CL 102 02/27/2013 1045   CL 102 05/22/2012 1031   CL 98 04/24/2012 1200   CO2 28 10/29/2013 1118   CO2 28 05/22/2012 1031   CO2 30 04/24/2012 1200   BUN 12.8 10/29/2013 1118    BUN 11 05/22/2012 1031   BUN 13 04/24/2012 1200   CREATININE 0.8 10/29/2013 1118   CREATININE 0.63 05/22/2012 1031   CREATININE 0.6 04/24/2012 1200      Component Value Date/Time   CALCIUM 9.8 10/29/2013 1118   CALCIUM 9.8 05/22/2012 1031   CALCIUM 9.6 04/24/2012 1200   ALKPHOS 84 10/29/2013 1118   ALKPHOS 77 04/24/2012 1200   ALKPHOS 76 10/17/2011 0405   AST 24 10/29/2013 1118   AST 30 04/24/2012 1200   AST 18 10/17/2011 0405   ALT 20 10/29/2013 1118   ALT 27 04/24/2012 1200   ALT 15 10/17/2011 0405   BILITOT 0.39 10/29/2013 1118   BILITOT 0.40 04/24/2012 1200   BILITOT 0.4 10/17/2011 0405       RADIOGRAPHIC STUDIES: Recent CT scan showed no evidence of recurrence I have personally reviewed the radiological images as listed and agreed with the findings in the report.  ASSESSMENT & PLAN:  #1 tonsillar cancer Clinically he has no evidence of recurrence. However I am concerned about the abnormal changes seen in the buccal mucosa. 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 He is dependent on PEG tube feeding for now. #6 tobacco abuse I spent some time counseling the patient about the importance of nicotine cessation but he is not motivated to quit for now.  Orders Placed This Encounter  Procedures  . CBC with Differential    Standing Status: Future     Number of Occurrences:      Standing Expiration Date: 11/02/2014  . Comprehensive metabolic panel    Standing Status: Future     Number of Occurrences:      Standing Expiration Date: 11/02/2014  . T4, free    Standing Status: Future  Number of  Occurrences:      Standing Expiration Date: 11/02/2014  . TSH    Standing Status: Future     Number of Occurrences:      Standing Expiration Date: 11/02/2014   All questions were answered. The patient knows to call the clinic with any problems, questions or concerns. No barriers to learning was detected. I spent 40 minutes counseling the patient face to face. The total time spent in the appointment was 60 minutes and more than 50% was on counseling and review of test results     [facility] Endoscopy CenterGORSUCH, Basim Bartnik, MD 11/02/2013 1:23 PM

## 2013-11-02 NOTE — Telephone Encounter (Signed)
gv and pritned appt sched adn avs forpt for April

## 2013-11-28 ENCOUNTER — Encounter: Payer: Self-pay | Admitting: Nutrition

## 2013-11-28 NOTE — Progress Notes (Signed)
Patient's sister contacted me for assistance in helping patient get tube feeding.  Patient has been refusing deliveries from [facility] the past several months.  Patient's sister reports patient needs new orders for feedings.  I have spoken with [facility] and they have been in contact with patient's sister.  Tube feeding will now be ordered and managed through patient's primary care physician.  Thersa Salt, RD, with [facility], will be working with patient's primary care physician to obtain tube feeding orders and for tube feeding management as needed.  I have offered patient samples of Ensure Plus or Osmolite 1.5 to get him through until new orders can be obtained. Contacted patients sister Earlie Server to relay information. Will provide one case of Ensure Plus for patient along with coupons.

## 2013-11-29 ENCOUNTER — Other Ambulatory Visit (HOSPITAL_COMMUNITY): Payer: Self-pay

## 2013-11-29 ENCOUNTER — Telehealth: Payer: Self-pay | Admitting: *Deleted

## 2013-11-29 DIAGNOSIS — C099 Malignant neoplasm of tonsil, unspecified: Secondary | ICD-10-CM

## 2013-11-29 NOTE — Telephone Encounter (Signed)
Sister called to report pt says his Feeding tube is splitting and needs to be replaced.  Asked her if it is leaking,  Is it usable right now?  She was unsure,  Says he just called her to tell her it is splitting.  Called IR and they can see pt today if he can get here by noon.  Called sister back and she says she will try to arrange transportation for pt..   Instructed pt not to eat or use tube until he sees IR.  Instructed for him to go to [facility]..  Asked sister to call us back if pt cannot make appt.  She verbalized understanding.

## 2013-12-03 ENCOUNTER — Ambulatory Visit (HOSPITAL_COMMUNITY)
Admission: RE | Admit: 2013-12-03 | Discharge: 2013-12-03 | Disposition: A | Discharge status: Home / Self Care | Payer: Medicaid Other | Source: Ambulatory Visit | Attending: Hematology and Oncology | Admitting: Hematology and Oncology

## 2013-12-03 ENCOUNTER — Other Ambulatory Visit: Payer: Self-pay | Admitting: Hematology and Oncology

## 2013-12-03 DIAGNOSIS — C099 Malignant neoplasm of tonsil, unspecified: Secondary | ICD-10-CM

## 2013-12-03 DIAGNOSIS — Z431 Encounter for attention to gastrostomy: Secondary | ICD-10-CM | POA: Insufficient documentation

## 2013-12-03 DIAGNOSIS — C090 Malignant neoplasm of tonsillar fossa: Secondary | ICD-10-CM | POA: Insufficient documentation

## 2013-12-03 NOTE — Procedures (Signed)
Successful exchange of existing 20 french balloon retention gastrostomy tube for new 20 french balloon retention tube secondary to leaking hole in external portion. No immediate complicatons.

## 2014-01-18 ENCOUNTER — Encounter: Payer: Self-pay | Admitting: Hematology and Oncology

## 2014-01-18 ENCOUNTER — Other Ambulatory Visit (HOSPITAL_BASED_OUTPATIENT_CLINIC_OR_DEPARTMENT_OTHER): Payer: Medicaid Other

## 2014-01-18 ENCOUNTER — Ambulatory Visit (HOSPITAL_BASED_OUTPATIENT_CLINIC_OR_DEPARTMENT_OTHER): Payer: Medicaid Other | Admitting: Hematology and Oncology

## 2014-01-18 ENCOUNTER — Telehealth: Payer: Self-pay | Admitting: Hematology and Oncology

## 2014-01-18 VITALS — BP 106/73 | HR 60 | Temp 98.0°F | Resp 18 | Ht 66.0 in | Wt 128.2 lb

## 2014-01-18 DIAGNOSIS — F17200 Nicotine dependence, unspecified, uncomplicated: Secondary | ICD-10-CM

## 2014-01-18 DIAGNOSIS — K859 Acute pancreatitis without necrosis or infection, unspecified: Secondary | ICD-10-CM

## 2014-01-18 DIAGNOSIS — R1033 Periumbilical pain: Secondary | ICD-10-CM

## 2014-01-18 DIAGNOSIS — C099 Malignant neoplasm of tonsil, unspecified: Secondary | ICD-10-CM

## 2014-01-18 DIAGNOSIS — K1370 Unspecified lesions of oral mucosa: Secondary | ICD-10-CM

## 2014-01-18 DIAGNOSIS — E039 Hypothyroidism, unspecified: Secondary | ICD-10-CM

## 2014-01-18 DIAGNOSIS — G8929 Other chronic pain: Secondary | ICD-10-CM

## 2014-01-18 LAB — CBC WITH DIFFERENTIAL/PLATELET
BASO%: 0 % (ref 0.0–2.0)
BASOS ABS: 0 10*3/uL (ref 0.0–0.1)
EOS ABS: 0.1 10*3/uL (ref 0.0–0.5)
EOS%: 2.9 % (ref 0.0–7.0)
HCT: 38.7 % (ref 38.4–49.9)
HEMOGLOBIN: 12.8 g/dL — AB (ref 13.0–17.1)
LYMPH#: 0.9 10*3/uL (ref 0.9–3.3)
LYMPH%: 26.4 % (ref 14.0–49.0)
MCH: 33.4 pg (ref 27.2–33.4)
MCHC: 33.1 g/dL (ref 32.0–36.0)
MCV: 101 fL — ABNORMAL HIGH (ref 79.3–98.0)
MONO#: 0.6 10*3/uL (ref 0.1–0.9)
MONO%: 16.9 % — ABNORMAL HIGH (ref 0.0–14.0)
NEUT#: 1.9 10*3/uL (ref 1.5–6.5)
NEUT%: 53.8 % (ref 39.0–75.0)
Platelets: 115 10*3/uL — ABNORMAL LOW (ref 140–400)
RBC: 3.83 10*6/uL — ABNORMAL LOW (ref 4.20–5.82)
RDW: 13.2 % (ref 11.0–14.6)
WBC: 3.5 10*3/uL — ABNORMAL LOW (ref 4.0–10.3)

## 2014-01-18 LAB — COMPREHENSIVE METABOLIC PANEL (CC13)
ALBUMIN: 4.4 g/dL (ref 3.5–5.0)
ALT: 9 U/L (ref 0–55)
AST: 21 U/L (ref 5–34)
Alkaline Phosphatase: 85 U/L (ref 40–150)
Anion Gap: 11 mEq/L (ref 3–11)
BUN: 9.1 mg/dL (ref 7.0–26.0)
CHLORIDE: 103 meq/L (ref 98–109)
CO2: 28 mEq/L (ref 22–29)
Calcium: 9.8 mg/dL (ref 8.4–10.4)
Creatinine: 0.8 mg/dL (ref 0.7–1.3)
GLUCOSE: 97 mg/dL (ref 70–140)
POTASSIUM: 4 meq/L (ref 3.5–5.1)
Sodium: 142 mEq/L (ref 136–145)
Total Bilirubin: 0.37 mg/dL (ref 0.20–1.20)
Total Protein: 8.2 g/dL (ref 6.4–8.3)

## 2014-01-18 LAB — T4, FREE: FREE T4: 1.06 ng/dL (ref 0.80–1.80)

## 2014-01-18 LAB — TSH CHCC: TSH: 6.234 m[IU]/L — AB (ref 0.320–4.118)

## 2014-01-18 LAB — LIPASE: Lipase: 17 U/L (ref 11–59)

## 2014-01-18 LAB — AMYLASE: Amylase: 13 U/L (ref 0–105)

## 2014-01-18 NOTE — Progress Notes (Signed)
[facility] OFFICE PROGRESS NOTE  Patient Care Team: Harvie Junior, MD as PCP - General (Specialist) Rozetta Nunnery, MD (Otolaryngology) Marye Round, MD (Radiation Oncology) Lenn Cal, DDS as Consulting Physician (Dentistry) Heath Lark, MD as Consulting Physician (Hematology and Oncology)  DIAGNOSIS: Tonsil cancer T2, N2, M0  SUMMARY OF ONCOLOGIC HISTORY: This is a pleasant 58 year old patient was found to have abnormal looking tonsil in 2012 by his dentist. 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 He underwent concurrent chemoradiation therapy with weekly cisplatin from 02/01/2011 through 03/22/2011. Chemotherapy was discontinued early due to 2 prolonged pancytopenia. In 2013, he was noted to have enlarging lymphadenopathy. 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 Last imaging study from 10/29/2013 showed no evidence of disease recurrence   INTERVAL HISTORY: Russell Williamson 58 y.o. male returns for followup. He complained of severe periumbilical pain, rating it 6/10 pain. He was prescribed fentanyl with oxycodone but he felt it is not taking care for pain. He denies any nausea or vomiting. He continue to smoke regularly. Denies any recent constipation. He has lost some weight.  I have reviewed the past medical history, past surgical history, social history and family history with the patient and they are unchanged from previous note.  ALLERGIES:  has No Known Allergies.  MEDICATIONS:  Current Outpatient Prescriptions  Medication Sig Dispense Refill  . Alum & Mag Hydroxide-Simeth (MAGIC MOUTHWASH W/LIDOCAINE) SOLN Take 5 mLs by mouth every 6 (six) hours as needed (Swish and Spit).  500 mL  0  .  butalbital-acetaminophen-caffeine (FIORICET, ESGIC) 50-325-40 MG per tablet Take 1 tablet by mouth daily as needed for headache.      . chlorhexidine (PERIDEX) 0.12 % solution Rinse with 15 mls three times daily for 30 seconds each. Use after breakfast, lunch, and at bedtime. Spit out excess. Do not swallow.  1440 mL  3  . Cholecalciferol (VITAMIN D PO) Take 1 tablet by mouth daily.      . fentaNYL (DURAGESIC - DOSED MCG/HR) 50 MCG/HR Place 1 patch onto the skin every 3 (three) days.      . fluconazole (DIFLUCAN) 100 MG tablet Take 1 tablet (100 mg total) by mouth daily.  7 tablet  0  . levothyroxine (LEVOTHROID) 25 MCG tablet Take 1 tablet (25 mcg total) by mouth daily before breakfast.  30 tablet  2  . Lidocaine HCl 2 % SOLN Swish and spit 5 mLs every 6 (six) hours as needed (mouth sores).  500 mL  2  . lipase/protease/amylase (CREON) 12000 UNITS CPEP Take 1 capsule by mouth 4 (four) times daily.      . metoprolol (LOPRESSOR) 50 MG tablet Take 50 mg by mouth 2 (two) times daily.      . Nutritional Supplements (ENSURE PO) Take 1 Can by mouth 3 (three) times daily.      . Nutritional Supplements (OSMOLITE 1.5 CAL PO) Give 7 Cans by tube See admin instructions. 1 can=8 oz.  7 cans throughout the day      . Omega-3 Fatty Acids (FISH OIL PO) Take 1 capsule by mouth daily.      Marland Kitchen omeprazole (PRILOSEC) 40 MG capsule Take 40 mg by mouth daily.       Marland Kitchen oxyCODONE (ROXICODONE) 15 MG immediate release tablet Take 15 mg by mouth  every 4 (four) hours.       . promethazine (PHENERGAN) 25 MG tablet Take 1 tablet (25 mg total) by mouth every 6 (six) hours as needed. For nausea  30 tablet  2  . temazepam (RESTORIL) 30 MG capsule Take 30 mg by mouth at bedtime as needed for sleep.       No current facility-administered medications for this visit.    REVIEW OF SYSTEMS:   Constitutional: Denies fevers, chills  Eyes: Denies blurriness of vision Ears, nose, mouth, throat, and face: Denies mucositis or sore  throat.  Respiratory: Denies cough, dyspnea or wheezes Cardiovascular: Denies palpitation, chest discomfort or lower extremity swelling Skin: Denies abnormal skin rashes Lymphatics: Denies new lymphadenopathy or easy bruising Neurological:Denies numbness, tingling or new weaknesses Behavioral/Psych: Mood is stable, no new changes  All other systems were reviewed with the patient and are negative.  PHYSICAL EXAMINATION: ECOG PERFORMANCE STATUS: 1 - Symptomatic but completely ambulatory  Filed Vitals:   01/18/14 1231  BP: 106/73  Pulse: 60  Temp: 98 F (36.7 C)  Resp: 18   Filed Weights   01/18/14 1231  Weight: 128 lb 3.2 oz (58.151 kg)    GENERAL:alert, no distress and comfortable SKIN: skin color, texture, turgor are normal, no rashes or significant lesions EYES: normal, Conjunctiva are pink and non-injected, sclera clear OROPHARYNX:no exudate, no erythema and lips, buccal mucosa, and tongue normal . Poor dentition is noted NECK: supple, thyroid normal size, non-tender, without nodularity LYMPH:  no palpable lymphadenopathy in the cervical, axillary or inguinal LUNGS: clear to auscultation and percussion with normal breathing effort HEART: regular rate & rhythm and no murmurs and no lower extremity edema ABDOMEN: Severe tenderness on gentle palpation, no rebound or guarding Musculoskeletal:no cyanosis of digits and no clubbing  NEURO: alert & oriented x 3 with fluent speech, no focal motor/sensory deficits  LABORATORY DATA:  I have reviewed the data as listed    Component Value Date/Time   NA 142 01/18/2014 1115   NA 139 05/22/2012 1031   NA 140 04/24/2012 1200   K 4.0 01/18/2014 1115   K 4.5 05/22/2012 1031   K 4.1 04/24/2012 1200   CL 102 02/27/2013 1045   CL 102 05/22/2012 1031   CL 98 04/24/2012 1200   CO2 28 01/18/2014 1115   CO2 28 05/22/2012 1031   CO2 30 04/24/2012 1200   GLUCOSE 97 01/18/2014 1115   GLUCOSE 118* 02/27/2013 1045   GLUCOSE 95 05/22/2012 1031   GLUCOSE  102 04/24/2012 1200   BUN 9.1 01/18/2014 1115   BUN 11 05/22/2012 1031   BUN 13 04/24/2012 1200   CREATININE 0.8 01/18/2014 1115   CREATININE 0.63 05/22/2012 1031   CREATININE 0.6 04/24/2012 1200   CALCIUM 9.8 01/18/2014 1115   CALCIUM 9.8 05/22/2012 1031   CALCIUM 9.6 04/24/2012 1200   PROT 8.2 01/18/2014 1115   PROT 8.1 04/24/2012 1200   PROT 7.2 10/17/2011 0405   ALBUMIN 4.4 01/18/2014 1115   ALBUMIN 3.3* 10/17/2011 0405   AST 21 01/18/2014 1115   AST 30 04/24/2012 1200   AST 18 10/17/2011 0405   ALT 9 01/18/2014 1115   ALT 27 04/24/2012 1200   ALT 15 10/17/2011 0405   ALKPHOS 85 01/18/2014 1115   ALKPHOS 77 04/24/2012 1200   ALKPHOS 76 10/17/2011 0405   BILITOT 0.37 01/18/2014 1115   BILITOT 0.40 04/24/2012 1200   BILITOT 0.4 10/17/2011 0405   GFRNONAA >90 05/22/2012 1031   GFRAA >90 05/22/2012  1031    No results found for this basename: SPEP,  UPEP,   kappa and lambda light chains    Lab Results  Component Value Date   WBC 3.5* 01/18/2014   NEUTROABS 1.9 01/18/2014   HGB 12.8* 01/18/2014   HCT 38.7 01/18/2014   MCV 101.0* 01/18/2014   PLT 115* 01/18/2014      Chemistry      Component Value Date/Time   NA 142 01/18/2014 1115   NA 139 05/22/2012 1031   NA 140 04/24/2012 1200   K 4.0 01/18/2014 1115   K 4.5 05/22/2012 1031   K 4.1 04/24/2012 1200   CL 102 02/27/2013 1045   CL 102 05/22/2012 1031   CL 98 04/24/2012 1200   CO2 28 01/18/2014 1115   CO2 28 05/22/2012 1031   CO2 30 04/24/2012 1200   BUN 9.1 01/18/2014 1115   BUN 11 05/22/2012 1031   BUN 13 04/24/2012 1200   CREATININE 0.8 01/18/2014 1115   CREATININE 0.63 05/22/2012 1031   CREATININE 0.6 04/24/2012 1200      Component Value Date/Time   CALCIUM 9.8 01/18/2014 1115   CALCIUM 9.8 05/22/2012 1031   CALCIUM 9.6 04/24/2012 1200   ALKPHOS 85 01/18/2014 1115   ALKPHOS 77 04/24/2012 1200   ALKPHOS 76 10/17/2011 0405   AST 21 01/18/2014 1115   AST 30 04/24/2012 1200   AST 18 10/17/2011 0405   ALT 9 01/18/2014 1115   ALT 27 04/24/2012 1200   ALT 15  10/17/2011 0405   BILITOT 0.37 01/18/2014 1115   BILITOT 0.40 04/24/2012 1200   BILITOT 0.4 10/17/2011 0405     ASSESSMENT & PLAN:  #1 tonsillar cancer Clinically he has no evidence of recurrence.  He has lost some weight. I recommend repeat CT scan of the neck and chest to exclude recurrence #2 abnormal change in the left buccal mucosa I would discuss with his surgeons for further evaluation #3 history of recurrent pancreatitis with severe pain I recommend ordering lipase amylase and CT abdomen for evaluation and he agreed. #4 hypothyroidism I will start him on thyroid supplement #5 chronic pain I would defer to his primary care provider for pain management #6 history of osteonecrosis of the jaw He still have persistent pain with difficulty swallowing food.  #6 tobacco abuse I spent some time counseling the patient about the importance of nicotine cessation but he is not motivated to quit for now.   Orders Placed This Encounter  Procedures  . CT Abdomen Pelvis W Contrast    Standing Status: Future     Number of Occurrences:      Standing Expiration Date: 04/20/2015    Order Specific Question:  Reason for Exam (SYMPTOM  OR DIAGNOSIS REQUIRED)    Answer:  severe abdominal pain, r/o pancreatitis    Order Specific Question:  Preferred imaging location?    Answer:  [facility]  . CT Chest W Contrast    Standing Status: Future     Number of Occurrences:      Standing Expiration Date: 03/20/2015    Order Specific Question:  Reason for Exam (SYMPTOM  OR DIAGNOSIS REQUIRED)    Answer:  tonsil cancer r/o recurrence    Order Specific Question:  Preferred imaging location?    Answer:  [facility]  . CT Soft Tissue Neck W Contrast    Standing Status: Future     Number of Occurrences:      Standing Expiration Date: 04/20/2015  Order Specific Question:  Reason for Exam (SYMPTOM  OR DIAGNOSIS REQUIRED)    Answer:  tonsil cancer r/o recurrence    Order Specific Question:   Preferred imaging location?    Answer:  [facility]  . Amylase    Standing Status: Future     Number of Occurrences: 1     Standing Expiration Date: 01/18/2015  . Lipase    Standing Status: Future     Number of Occurrences: 1     Standing Expiration Date: 01/18/2015   All questions were answered. The patient knows to call the clinic with any problems, questions or concerns. No barriers to learning was detected. I spent 40 minutes counseling the patient face to face. The total time spent in the appointment was 55 minutes and more than 50% was on counseling and review of test results     Heath Lark, MD 01/18/2014 5:22 PM

## 2014-01-18 NOTE — Telephone Encounter (Signed)
Gave pt appt for MD on May 2015 °

## 2014-01-23 ENCOUNTER — Encounter (HOSPITAL_COMMUNITY): Payer: Self-pay | Admitting: Emergency Medicine

## 2014-01-23 ENCOUNTER — Emergency Department (HOSPITAL_COMMUNITY): Payer: Medicaid Other

## 2014-01-23 ENCOUNTER — Emergency Department (HOSPITAL_COMMUNITY)
Admission: EM | Admit: 2014-01-23 | Discharge: 2014-01-23 | Disposition: A | Discharge status: Home / Self Care | Payer: Medicaid Other | Attending: Emergency Medicine | Admitting: Emergency Medicine

## 2014-01-23 DIAGNOSIS — Z8669 Personal history of other diseases of the nervous system and sense organs: Secondary | ICD-10-CM | POA: Insufficient documentation

## 2014-01-23 DIAGNOSIS — Z8589 Personal history of malignant neoplasm of other organs and systems: Secondary | ICD-10-CM | POA: Insufficient documentation

## 2014-01-23 DIAGNOSIS — F17200 Nicotine dependence, unspecified, uncomplicated: Secondary | ICD-10-CM | POA: Insufficient documentation

## 2014-01-23 DIAGNOSIS — G8929 Other chronic pain: Secondary | ICD-10-CM | POA: Insufficient documentation

## 2014-01-23 DIAGNOSIS — E039 Hypothyroidism, unspecified: Secondary | ICD-10-CM | POA: Insufficient documentation

## 2014-01-23 DIAGNOSIS — Z8619 Personal history of other infectious and parasitic diseases: Secondary | ICD-10-CM | POA: Insufficient documentation

## 2014-01-23 DIAGNOSIS — Z85819 Personal history of malignant neoplasm of unspecified site of lip, oral cavity, and pharynx: Secondary | ICD-10-CM | POA: Insufficient documentation

## 2014-01-23 DIAGNOSIS — I10 Essential (primary) hypertension: Secondary | ICD-10-CM | POA: Insufficient documentation

## 2014-01-23 DIAGNOSIS — R079 Chest pain, unspecified: Secondary | ICD-10-CM | POA: Insufficient documentation

## 2014-01-23 DIAGNOSIS — K861 Other chronic pancreatitis: Secondary | ICD-10-CM

## 2014-01-23 DIAGNOSIS — Z923 Personal history of irradiation: Secondary | ICD-10-CM | POA: Insufficient documentation

## 2014-01-23 DIAGNOSIS — Z8739 Personal history of other diseases of the musculoskeletal system and connective tissue: Secondary | ICD-10-CM | POA: Insufficient documentation

## 2014-01-23 DIAGNOSIS — Z79899 Other long term (current) drug therapy: Secondary | ICD-10-CM | POA: Insufficient documentation

## 2014-01-23 LAB — COMPREHENSIVE METABOLIC PANEL
ALT: 9 U/L (ref 0–53)
AST: 23 U/L (ref 0–37)
Albumin: 4.5 g/dL (ref 3.5–5.2)
Alkaline Phosphatase: 84 U/L (ref 39–117)
BUN: 9 mg/dL (ref 6–23)
CALCIUM: 9.8 mg/dL (ref 8.4–10.5)
CO2: 25 mEq/L (ref 19–32)
CREATININE: 0.64 mg/dL (ref 0.50–1.35)
Chloride: 99 mEq/L (ref 96–112)
GFR calc non Af Amer: >90 mL/min (ref >90)
GLUCOSE: 94 mg/dL (ref 70–99)
Potassium: 4 mEq/L (ref 3.7–5.3)
SODIUM: 139 meq/L (ref 137–147)
TOTAL PROTEIN: 7.9 g/dL (ref 6.0–8.3)
Total Bilirubin: 0.5 mg/dL (ref 0.3–1.2)

## 2014-01-23 LAB — CBC WITH DIFFERENTIAL/PLATELET
Basophils Absolute: 0 10*3/uL (ref 0.0–0.1)
Basophils Relative: 0 % (ref 0–1)
EOS ABS: 0 10*3/uL (ref 0.0–0.7)
Eosinophils Relative: 0 % (ref 0–5)
HCT: 36.2 % — ABNORMAL LOW (ref 39.0–52.0)
Hemoglobin: 12.5 g/dL — ABNORMAL LOW (ref 13.0–17.0)
Lymphocytes Relative: 22 % (ref 12–46)
Lymphs Abs: 0.8 10*3/uL (ref 0.7–4.0)
MCH: 34.2 pg — ABNORMAL HIGH (ref 26.0–34.0)
MCHC: 34.5 g/dL (ref 30.0–36.0)
MCV: 98.9 fL (ref 78.0–100.0)
MONO ABS: 0.7 10*3/uL (ref 0.1–1.0)
Monocytes Relative: 21 % — ABNORMAL HIGH (ref 3–12)
Neutro Abs: 2 10*3/uL (ref 1.7–7.7)
Neutrophils Relative %: 56 % (ref 43–77)
PLATELETS: 114 10*3/uL — AB (ref 150–400)
RBC: 3.66 MIL/uL — ABNORMAL LOW (ref 4.22–5.81)
RDW: 13 % (ref 11.5–15.5)
WBC: 3.6 10*3/uL — ABNORMAL LOW (ref 4.0–10.5)

## 2014-01-23 LAB — TROPONIN I: Troponin I: <0.3 ng/mL (ref <0.30)

## 2014-01-23 LAB — LIPASE, BLOOD: LIPASE: 9 U/L — AB (ref 11–59)

## 2014-01-23 MED ORDER — HYDROMORPHONE HCL PF 1 MG/ML IJ SOLN
1.0000 mg | Freq: Once | INTRAMUSCULAR | Status: AC
Start: 2014-01-23 — End: 2014-01-23
  Administered 2014-01-23: 1 mg via INTRAVENOUS
  Filled 2014-01-23: qty 1

## 2014-01-23 MED ORDER — HYDROMORPHONE HCL PF 1 MG/ML IJ SOLN
1.0000 mg | Freq: Once | INTRAMUSCULAR | Status: AC
  Administered 2014-01-23: 1 mg via INTRAVENOUS
  Filled 2014-01-23: qty 1

## 2014-01-23 MED ORDER — IOHEXOL 300 MG/ML  SOLN
100.0000 mL | Freq: Once | INTRAMUSCULAR | Status: AC | PRN
  Administered 2014-01-23: 100 mL via INTRAVENOUS

## 2014-01-23 MED ORDER — IOHEXOL 300 MG/ML  SOLN
50.0000 mL | Freq: Once | INTRAMUSCULAR | Status: AC | PRN
  Administered 2014-01-23: 50 mL via ORAL

## 2014-01-23 NOTE — ED Provider Notes (Signed)
CSN: [id number]     Arrival date & time 01/23/14  1611 History   First MD Initiated Contact with Patient 01/23/14 1645     Chief Complaint  Patient presents with  . Abdominal Pain     (Consider location/radiation/quality/duration/timing/severity/associated sxs/prior Treatment) HPI 58 year old male with a history of pancreatitis since 2008 presents with recurrent epigastric abdominal pain and now chest pain over the past week and half. He states this feels like his chronic pancreatitis. He states he has not had abdominal pain like this for several years. He typically goes a pain clinic for his chronic neck and pancreatitis. He's been on oxycodone but no patches but states is not controlling his pain. They referred him here for evaluation. He's been nauseous but has not had any vomiting. No fevers or chills. No diarrhea. He states when these had pain this bad it tends to be from "a cyst on my pancreas". Patient rates his pain as a 7/10.  Past Medical History  Diagnosis Date  . Hypertension   . Chronic pancreatitis   . Arthritis   . Hx of radiation therapy 02/02/11 to 03/22/11    L tonsil  . Seizures     alcohol-related  . Cervical adenopathy 04/26/2012  . Urinary hesitancy   . Constipation   . Cancer 12/15/10    left tonsillar squamous cell carcinoma  . Tonsillar cancer     tonsillar ca dx 12/27/10  . 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 Shortness of breath 05/23/2012    "lying down"  . History of blood transfusion ~ 2004    "from the pancreatitis"  . Daily headache 05/23/2012    "small ones"  . History of radiation therapy 02/02/2011-03/22/2011    head/neck,left tonsil  . G tube feedings     has a feeding tube in stomach  . Hypothyroidism 10/29/2013  . Thrush 11/02/2013   Past Surgical History  Procedure Laterality Date  . Bile duct stent placement      hx of  . Radical neck dissection  05/23/2012    w/mass excision  . Cholecystectomy  04/2005  . Tibia fracture surgery   1990's    left  . Fracture surgery    . Direct laryngoscopy  05/23/2012    Procedure: DIRECT LARYNGOSCOPY;  Surgeon: Rozetta Nunnery, MD;  Location: [place];  Service: ENT;  Laterality: N/A;  . Mass biopsy  05/23/2012    Procedure: NECK MASS BIOPSY;  Surgeon: Rozetta Nunnery, MD;  Location: [place];  Service: ENT;  Laterality: N/A;  . Radical neck dissection  05/23/2012    Procedure: RADICAL NECK DISSECTION;  Surgeon: Rozetta Nunnery, MD;  Location: [place];  Service: ENT;  Laterality: Left;  . Direct laryngoscopy N/A 03/23/2013    Procedure: DIRECT LARYNGOSCOPY;  Surgeon: Rozetta Nunnery, MD;  Location: [place];  Service: ENT;  Laterality: N/A;  . Esophageal dilation N/A 03/23/2013    Procedure: ESOPHAGEAL DILATION;  Surgeon: Rozetta Nunnery, MD;  Location: [place];  Service: ENT;  Laterality: N/A;   Family History  Problem Relation Age of Onset  . COPD Mother    History  Substance Use Topics  . Smoking status: Current Every Day Smoker -- 0.25 packs/day for 40 years    Types: Cigarettes  . Smokeless tobacco: Never Used     Comment: hx 1/2 -1 PPD  . Alcohol Use: No     Comment: 05/23/2012 hx alcohol abuse,  quit ~ 2000    Review of Systems  Constitutional: Negative for fever.  Respiratory: Negative for shortness of breath.   Cardiovascular: Positive for chest pain.  Gastrointestinal: Positive for nausea and abdominal pain. Negative for vomiting and diarrhea.  Musculoskeletal: Negative for back pain.  All other systems reviewed and are negative.     Allergies  Review of patient's allergies indicates no known allergies.  Home Medications   Prior to Admission medications   Medication Sig Start Date End Date Taking? Authorizing Provider  Alum & Mag Hydroxide-Simeth (MAGIC MOUTHWASH W/LIDOCAINE) SOLN Take 5 mLs by mouth every 6 (six) hours as needed (Swish and Spit). 08/29/12  Yes Maryanna Shape, NP  chlorhexidine (PERIDEX)  0.12 % solution Use as directed 15 mLs in the mouth or throat 3 (three) times daily. Use for 30 seconds each time.   Yes Historical Provider, MD  Cholecalciferol (VITAMIN D PO) Take 1 tablet by mouth daily.   Yes Historical Provider, MD  doxylamine, Sleep, (UNISOM) 25 MG tablet Take 50 mg by mouth at bedtime as needed for sleep.   Yes Historical Provider, MD  fentaNYL (DURAGESIC - DOSED MCG/HR) 50 MCG/HR Place 1 patch onto the skin every 3 (three) days.   Yes Historical Provider, MD  fluconazole (DIFLUCAN) 100 MG tablet Take 100 mg by mouth daily. 11/02/13  Yes Heath Lark, MD  levothyroxine (SYNTHROID, LEVOTHROID) 25 MCG tablet Take 25 mcg by mouth daily before breakfast. 11/02/13  Yes Heath Lark, MD  Lidocaine HCl 2 % SOLN Swish and spit 5 mLs every 6 (six) hours as needed (mouth sores). 08/27/13  Yes Heath Lark, MD  lipase/protease/amylase (CREON-12/PANCREASE) 12000 UNITS CPEP capsule Take 1 capsule by mouth 4 (four) times daily.   Yes Historical Provider, MD  metoprolol (LOPRESSOR) 50 MG tablet Take 50 mg by mouth 2 (two) times daily.   Yes Historical Provider, MD  Nutritional Supplements (ENSURE PO) Take 1 Can by mouth 3 (three) times daily.   Yes Historical Provider, MD  Nutritional Supplements (FEEDING SUPPLEMENT, OSMOLITE 1.5 CAL,) LIQD Take 237 mLs by mouth daily. Drinks 4 cans per day   Yes Historical Provider, MD  Omega-3 Fatty Acids (FISH OIL PO) Take 1 capsule by mouth daily.   Yes Historical Provider, MD  omeprazole (PRILOSEC) 40 MG capsule Take 40 mg by mouth daily.    Yes Historical Provider, MD  oxyCODONE (ROXICODONE) 15 MG immediate release tablet Take 15 mg by mouth every 4 (four) hours.    Yes Historical Provider, MD  promethazine (PHENERGAN) 25 MG tablet Take 1 tablet (25 mg total) by mouth every 6 (six) hours as needed. For nausea 08/28/12  Yes Maryanna Shape, NP  temazepam (RESTORIL) 30 MG capsule Take 30 mg by mouth at bedtime as needed for sleep.   Yes Historical Provider, MD    BP 151/85  Pulse 51  Temp(Src) 98.1 F (36.7 C) (Oral)  Resp 11  SpO2 99% Physical Exam  Nursing note and vitals reviewed. Constitutional: He is oriented to person, place, and time. He appears well-developed and well-nourished. No distress.  HENT:  Head: Normocephalic and atraumatic.  Right Ear: External ear normal.  Left Ear: External ear normal.  Nose: Nose normal.  Eyes: Right eye exhibits no discharge. Left eye exhibits no discharge.  Neck: Neck supple.  Cardiovascular: Normal rate, regular rhythm, normal heart sounds and intact distal pulses.   Pulmonary/Chest: Effort normal and breath sounds normal. He exhibits no tenderness.  Abdominal: Soft. There is tenderness  in the epigastric area.  Musculoskeletal: He exhibits no edema.  Neurological: He is alert and oriented to person, place, and time.  Skin: Skin is warm and dry.    ED Course  Procedures (including critical care time) Labs Review Labs Reviewed  CBC WITH DIFFERENTIAL - Abnormal; Notable for the following:    WBC 3.6 (*)    RBC 3.66 (*)    Hemoglobin 12.5 (*)    HCT 36.2 (*)    MCH 34.2 (*)    Platelets 114 (*)    Monocytes Relative 21 (*)    All other components within normal limits  LIPASE, BLOOD - Abnormal; Notable for the following:    Lipase 9 (*)    All other components within normal limits  COMPREHENSIVE METABOLIC PANEL  TROPONIN I    Imaging Review Ct Abdomen Pelvis W Contrast  01/23/2014   CLINICAL DATA:  Abdominal pain  EXAM: CT ABDOMEN AND PELVIS WITH CONTRAST  TECHNIQUE: Multidetector CT imaging of the abdomen and pelvis was performed using the standard protocol following bolus administration of intravenous contrast.  CONTRAST:  80mL OMNIPAQUE IOHEXOL 300 MG/ML SOLN, 129mL OMNIPAQUE IOHEXOL 300 MG/ML SOLN  COMPARISON:  10/15/2011  FINDINGS: Lung bases show some scarring in the left lung base. In the right lung base there are some nodular changes. The largest of these measures 11 mm in  dimension. This has increased in the interval from the prior exam.  The liver, spleen, adrenal glands and kidneys are within normal limits with the exception of this few small renal cysts. . The pancreas demonstrates multiple calcifications consistent with chronic pancreatitis. The overall appearance is stable. No definitive pseudocysts is seen. A gastrostomy catheter is noted in satisfactory position. Diffuse aortic calcifications are noted without aneurysmal dilatation. The gallbladder has been surgically removed. The appendix is not well visualized although no inflammatory changes are noted.  The bladder is well distended. No pelvic mass lesion or sidewall abnormality is noted. No gross osseous abnormality is seen.  IMPRESSION: Changes consistent with chronic pancreatitis. No definitive pseudocyst is identified  Nodular changes in the right lung base. These have increased in the interval from the prior exam. PET-CT may be helpful for further evaluation given the patient's clinical history.   Electronically Signed   By: Inez Catalina M.D.   On: 01/23/2014 19:13     EKG Interpretation None      Date: 01/23/2014  Rate: 63  Rhythm: normal sinus rhythm  QRS Axis: normal  Intervals: normal  ST/T Wave abnormalities: normal  Conduction Disutrbances:none  Narrative Interpretation:   Old EKG Reviewed: unchanged    MDM   Final diagnoses:  Chronic pancreatitis    Patient's pain is controlled here with IV narcotics. His labs and CT scan are benign. I believe his chest pain is of abdominal origin given his coinciding with his chronic pancreatitis flare. This does not seem consistent with ACS, PE, or dissection. His HEART score is low. As his pain improved, and he has benign work up I believe he can be discharged and followed as an outpatient. He'll followup with his pain clinic and his PCP.    Ephraim Hamburger, MD 01/23/14 [phone]

## 2014-01-23 NOTE — ED Notes (Signed)
Pt states he drove himself here and has no other ride home. Pt aware that he will need to wait at least 2 hrs after receiving Dilaudid before he can be discharged and allowed to drive home, per [facility] charge nurse.

## 2014-01-23 NOTE — ED Notes (Addendum)
Pt c/o upper abdominal pain and central chest pain x 1.5 weeks.  Pain score 6/10.  Hx of chronic pancreatitis.  Pt was seen by PCP on 4/17, seen by Pain Clinic today, and told to come to ED.  Pt was given prescriptions for Roxicodone and Fentanyl patches from Pain Clinic, but he has not gotten them filled.   Pt sts prescribed pain medication do not work anymore.

## 2014-01-23 NOTE — ED Notes (Signed)
Assumed care of patient Patient up to bathroom without assistance from nursing staff Patient denies needs at this time and is in NAD Side rails up, call bell in reach

## 2014-01-23 NOTE — ED Notes (Signed)
Patient reports that he was able to obtain a ride home from a friend and that he is out front to pick him up Patient alert and oriented x 4 and in NAD upon time of DC home

## 2014-01-23 NOTE — ED Notes (Signed)
Due to several doses of pain medication, patient informed that he needs to find a ride home--drove self

## 2014-01-25 ENCOUNTER — Ambulatory Visit (HOSPITAL_COMMUNITY)
Admission: RE | Admit: 2014-01-25 | Discharge: 2014-01-25 | Disposition: A | Discharge status: Home / Self Care | Payer: Medicaid Other | Source: Ambulatory Visit | Attending: Hematology and Oncology | Admitting: Hematology and Oncology

## 2014-01-25 DIAGNOSIS — R079 Chest pain, unspecified: Secondary | ICD-10-CM | POA: Insufficient documentation

## 2014-01-25 DIAGNOSIS — J438 Other emphysema: Secondary | ICD-10-CM | POA: Insufficient documentation

## 2014-01-25 DIAGNOSIS — T66XXXA Radiation sickness, unspecified, initial encounter: Secondary | ICD-10-CM | POA: Insufficient documentation

## 2014-01-25 DIAGNOSIS — I7090 Unspecified atherosclerosis: Secondary | ICD-10-CM | POA: Insufficient documentation

## 2014-01-25 DIAGNOSIS — Z9221 Personal history of antineoplastic chemotherapy: Secondary | ICD-10-CM | POA: Insufficient documentation

## 2014-01-25 DIAGNOSIS — I319 Disease of pericardium, unspecified: Secondary | ICD-10-CM | POA: Insufficient documentation

## 2014-01-25 DIAGNOSIS — Z923 Personal history of irradiation: Secondary | ICD-10-CM | POA: Insufficient documentation

## 2014-01-25 DIAGNOSIS — C099 Malignant neoplasm of tonsil, unspecified: Secondary | ICD-10-CM | POA: Insufficient documentation

## 2014-01-25 DIAGNOSIS — M899 Disorder of bone, unspecified: Secondary | ICD-10-CM | POA: Insufficient documentation

## 2014-01-25 DIAGNOSIS — J984 Other disorders of lung: Secondary | ICD-10-CM | POA: Insufficient documentation

## 2014-01-25 DIAGNOSIS — I6529 Occlusion and stenosis of unspecified carotid artery: Secondary | ICD-10-CM | POA: Insufficient documentation

## 2014-01-25 DIAGNOSIS — M949 Disorder of cartilage, unspecified: Secondary | ICD-10-CM

## 2014-01-25 DIAGNOSIS — I82B19 Acute embolism and thrombosis of unspecified subclavian vein: Secondary | ICD-10-CM | POA: Insufficient documentation

## 2014-01-25 DIAGNOSIS — Y842 Radiological procedure and radiotherapy as the cause of abnormal reaction of the patient, or of later complication, without mention of misadventure at the time of the procedure: Secondary | ICD-10-CM | POA: Insufficient documentation

## 2014-01-25 MED ORDER — IOHEXOL 300 MG/ML  SOLN
80.0000 mL | Freq: Once | INTRAMUSCULAR | Status: AC | PRN
  Administered 2014-01-25: 80 mL via INTRAVENOUS

## 2014-01-28 ENCOUNTER — Other Ambulatory Visit: Payer: Self-pay | Admitting: Hematology and Oncology

## 2014-01-28 ENCOUNTER — Telehealth: Payer: Self-pay | Admitting: *Deleted

## 2014-01-28 DIAGNOSIS — C099 Malignant neoplasm of tonsil, unspecified: Secondary | ICD-10-CM

## 2014-01-28 DIAGNOSIS — M899 Disorder of bone, unspecified: Secondary | ICD-10-CM

## 2014-01-28 NOTE — Telephone Encounter (Signed)
Pt called to ask if ok to have his bone scan done this week or does it have to wait until next week?  Informed pt ok if available to have done this week before he sees Dr. Alvy Bimler on May 1st.  Called Radiology scheduler, Vickii Chafe and she scheduled pt for Scan on 4/30 at [facility].  She will contact pt with information.

## 2014-01-29 ENCOUNTER — Encounter (HOSPITAL_COMMUNITY): Payer: Self-pay | Admitting: Dentistry

## 2014-01-29 ENCOUNTER — Ambulatory Visit (HOSPITAL_COMMUNITY): Payer: Medicaid - Dental | Admitting: Dentistry

## 2014-01-29 VITALS — BP 109/72 | HR 51 | Temp 98.2°F

## 2014-01-29 DIAGNOSIS — K117 Disturbances of salivary secretion: Secondary | ICD-10-CM

## 2014-01-29 DIAGNOSIS — K08109 Complete loss of teeth, unspecified cause, unspecified class: Secondary | ICD-10-CM

## 2014-01-29 DIAGNOSIS — R682 Dry mouth, unspecified: Secondary | ICD-10-CM

## 2014-01-29 DIAGNOSIS — K000 Anodontia: Secondary | ICD-10-CM

## 2014-01-29 DIAGNOSIS — M272 Inflammatory conditions of jaws: Secondary | ICD-10-CM

## 2014-01-29 DIAGNOSIS — R252 Cramp and spasm: Secondary | ICD-10-CM

## 2014-01-29 DIAGNOSIS — K0820 Unspecified atrophy of edentulous alveolar ridge: Secondary | ICD-10-CM

## 2014-01-29 NOTE — Patient Instructions (Signed)
Plan/recommendations: 1. I suggested referral to a prosthodontist in [place], [place] or in [place], [place] for evaluation for fabrication of a new upper and lower complete denture as indicated.  Patient currently refuses this option. 2. Patient to use chlorhexidine rinses 2-3 times daily as instructed. Patient may also use oral rinses as needed. Patient to brush his tongue daily.  3. Patient also instructed to stop smoking. 4. Patient was instructed to continue trismus exercises as per physical therapy. 5. Return to clinic as scheduled. Patient is to call if problems worsen.  Dr. Enrique Sack

## 2014-01-29 NOTE — Progress Notes (Signed)
01/29/2014   Patient:            Russell Williamson Date of Birth:  January 06, 1956 MRN:                [id number]  BP 109/72  Pulse 51  Temp(Src) 98.2 F (36.8 C) (Oral)  [name] presents for limited oral examination and re-evaluation of exposed bone of the upper right quadrant.  The patient was last seen on 10/31/2013 for evaluation of exposed bone and partial ostectomy.  Area of exposed bone measured 2 mm round at that time.   S: Patient is not complaining of any exposed bone or denture irriatation. The patient is not wearing his upper and lower complete dentures very often by report. Patient is still having difficultly swallowing and is seeing Dr. Radene Journey later today. The patient also had a history of recent hospitalization for pancreatitis. Patient denies any active drinking of alcohol. Patient still is smoking cigarettes. Patient was again encouraged to stop smoking.    Exam: Patient with generalized erythema to oral tissues consistent with continued smoking and xerostomia. The patient is edentulous. There is atrophy of edentulous alveolar ridges. Patient has NO exposed bone noted. The patient has persistent trismus symptoms. The maximum interincisal opening is now measured at 30 mm.  The patient indicates that he still able to get his upper and lower complete dentures in. However, he is using the dentures minimally. The patient has previously seen physical therapy for trismus exercises. The patient was again instructed to continue the exercises in physical therapy team recommended. Patient is again aware that if trismus gets worse, he may not be able to get his dentures in at all. Pressure indicating paste was applied to the upper and lower complete dentures. No adjustments were needed.   Assessments: 1. History of exposed bone of the upper right maxillary alveolar ridge now resolved. 2. Xerostomia 3. Atrophy of edentulous alveolar ridge 4. The patient is edentulous. 5.  Trismus with decreased maximum interincisal open  Plan/recommendations: 1. I suggested referral to a prosthodontist in [place], [place] or in [place], [place] for evaluation for fabrication of a new upper and lower complete denture as indicated.  Patient currently refuses this option. 2. Patient to use chlorhexidine rinses 2-3 times daily as instructed. Patient may also use oral rinses as needed. Patient to brush his tongue daily.  3. Patient also instructed to stop smoking. 4. Patient was instructed to continue trismus exercises as per physical therapy. 5. Return to clinic as scheduled. Patient is to call if problems worsen.  Patient dismissed in stable condition.   Dr. Teena Dunk

## 2014-01-31 ENCOUNTER — Encounter (HOSPITAL_COMMUNITY)
Admission: RE | Admit: 2014-01-31 | Discharge: 2014-01-31 | Disposition: A | Discharge status: Home / Self Care | Payer: Medicaid Other | Source: Ambulatory Visit | Attending: Hematology and Oncology | Admitting: Hematology and Oncology

## 2014-01-31 DIAGNOSIS — C099 Malignant neoplasm of tonsil, unspecified: Secondary | ICD-10-CM | POA: Insufficient documentation

## 2014-01-31 DIAGNOSIS — M899 Disorder of bone, unspecified: Secondary | ICD-10-CM | POA: Insufficient documentation

## 2014-01-31 DIAGNOSIS — M949 Disorder of cartilage, unspecified: Secondary | ICD-10-CM

## 2014-01-31 MED ORDER — TECHNETIUM TC 99M MEDRONATE IV KIT
25.0000 | PACK | Freq: Once | INTRAVENOUS | Status: AC | PRN
  Administered 2014-01-31: 25 via INTRAVENOUS

## 2014-02-01 ENCOUNTER — Other Ambulatory Visit: Payer: Self-pay | Admitting: Hematology and Oncology

## 2014-02-01 ENCOUNTER — Ambulatory Visit (HOSPITAL_BASED_OUTPATIENT_CLINIC_OR_DEPARTMENT_OTHER): Payer: Medicaid Other | Admitting: Hematology and Oncology

## 2014-02-01 ENCOUNTER — Telehealth: Payer: Self-pay | Admitting: Hematology and Oncology

## 2014-02-01 ENCOUNTER — Other Ambulatory Visit: Payer: Self-pay | Admitting: Medical Oncology

## 2014-02-01 ENCOUNTER — Encounter: Payer: Self-pay | Admitting: Hematology and Oncology

## 2014-02-01 VITALS — BP 141/94 | HR 56 | Temp 98.2°F | Resp 18 | Ht 66.0 in | Wt 122.2 lb

## 2014-02-01 DIAGNOSIS — E039 Hypothyroidism, unspecified: Secondary | ICD-10-CM

## 2014-02-01 DIAGNOSIS — C099 Malignant neoplasm of tonsil, unspecified: Secondary | ICD-10-CM

## 2014-02-01 DIAGNOSIS — F17200 Nicotine dependence, unspecified, uncomplicated: Secondary | ICD-10-CM

## 2014-02-01 DIAGNOSIS — R6884 Jaw pain: Secondary | ICD-10-CM

## 2014-02-01 DIAGNOSIS — K1370 Unspecified lesions of oral mucosa: Secondary | ICD-10-CM

## 2014-02-01 DIAGNOSIS — K5900 Constipation, unspecified: Secondary | ICD-10-CM

## 2014-02-01 DIAGNOSIS — R911 Solitary pulmonary nodule: Secondary | ICD-10-CM

## 2014-02-01 MED ORDER — POLYETHYLENE GLYCOL 3350 17 G PO PACK: 17.0000 g | PACK | Freq: Every day | ORAL | Status: DC

## 2014-02-01 MED ORDER — LEVOTHYROXINE SODIUM 50 MCG PO TABS: 50.0000 ug | ORAL_TABLET | Freq: Every day | ORAL | Status: DC

## 2014-02-01 NOTE — Progress Notes (Signed)
[facility] OFFICE PROGRESS NOTE  Patient Care Team: Harvie Junior, MD as PCP - General (Specialist) Rozetta Nunnery, MD (Otolaryngology) Marye Round, MD (Radiation Oncology) Lenn Cal, DDS as Consulting Physician (Dentistry) Heath Lark, MD as Consulting Physician (Hematology and Oncology)  DIAGNOSIS: Tonsil cancer T2, N2, M0  SUMMARY OF ONCOLOGIC HISTORY: This is a pleasant 58 year old patient was found to have abnormal looking tonsil in 2012 by his dentist. 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 He underwent concurrent chemoradiation therapy with weekly cisplatin from 02/01/2011 through 03/22/2011. Chemotherapy was discontinued early due to 2 prolonged pancytopenia. In 2013, he was noted to have enlarging lymphadenopathy. 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 Last imaging study from 10/29/2013 showed no evidence of disease recurrence   INTERVAL HISTORY: Russell Williamson 58 y.o. male returns for further followup. He has low energy level and severe constipation. The patient had mild urinary hesitancy. He complained of intermittent periumbilical pain for the last month, and he rated his pain is 5/10 pain. He has occasional nausea. The patient has lost some weight. He continued to smoke, down to 2 cigarettes per day and attempting to quit smoking.  I have reviewed the past medical history, past surgical history, social history and family history with the patient and they are unchanged from previous note.  ALLERGIES:  has No Known Allergies.  MEDICATIONS:  Current Outpatient Prescriptions  Medication Sig Dispense Refill  . Alum & Mag Hydroxide-Simeth (MAGIC MOUTHWASH W/LIDOCAINE) SOLN Take 5 mLs by mouth every 6  (six) hours as needed (Swish and Spit).  500 mL  0  . chlorhexidine (PERIDEX) 0.12 % solution Use as directed 15 mLs in the mouth or throat 3 (three) times daily. Use for 30 seconds each time.      . Cholecalciferol (VITAMIN D PO) Take 1 tablet by mouth daily.      Marland Kitchen doxylamine, Sleep, (UNISOM) 25 MG tablet Take 50 mg by mouth at bedtime as needed for sleep.      . fentaNYL (DURAGESIC - DOSED MCG/HR) 50 MCG/HR Place 1 patch onto the skin every 3 (three) days.      . fluconazole (DIFLUCAN) 100 MG tablet Take 100 mg by mouth daily.      Marland Kitchen levothyroxine (SYNTHROID, LEVOTHROID) 50 MCG tablet TAKE 1 TABLET BY MOUTH DAILY BEFORE BREAKFAST  90 tablet  3  . Lidocaine HCl 2 % SOLN Swish and spit 5 mLs every 6 (six) hours as needed (mouth sores).      . lipase/protease/amylase (CREON-12/PANCREASE) 12000 UNITS CPEP capsule Take 1 capsule by mouth 4 (four) times daily.      . metoprolol (LOPRESSOR) 50 MG tablet Take 50 mg by mouth 2 (two) times daily.      . Nutritional Supplements (ENSURE PO) Take 1 Can by mouth 3 (three) times daily.      . Nutritional Supplements (FEEDING SUPPLEMENT, OSMOLITE 1.5 CAL,) LIQD Take 237 mLs by mouth daily. Drinks 4 cans per day      . Omega-3 Fatty Acids (FISH OIL PO) Take 1 capsule by mouth daily.      Marland Kitchen omeprazole (PRILOSEC) 40 MG capsule Take 40 mg by mouth daily.       Marland Kitchen oxyCODONE (ROXICODONE) 15 MG immediate release tablet Take 15 mg by mouth every 4 (four)  hours.       . polyethylene glycol (MIRALAX) packet Take 17 g by mouth daily.  30 each  1  . promethazine (PHENERGAN) 25 MG tablet Take 1 tablet (25 mg total) by mouth every 6 (six) hours as needed. For nausea  30 tablet  2  . temazepam (RESTORIL) 30 MG capsule Take 30 mg by mouth at bedtime as needed for sleep.       No current facility-administered medications for this visit.    REVIEW OF SYSTEMS:   Constitutional: Denies fevers, chills Eyes: Denies blurriness of vision Ears, nose, mouth, throat, and face:  Denies mucositis or sore throat Respiratory: Denies cough, dyspnea or wheezes Cardiovascular: Denies palpitation, chest discomfort or lower extremity swelling Skin: Denies abnormal skin rashes Lymphatics: Denies new lymphadenopathy or easy bruising Neurological:Denies numbness, tingling or new weaknesses Behavioral/Psych: Mood is stable, no new changes  All other systems were reviewed with the patient and are negative.  PHYSICAL EXAMINATION: ECOG PERFORMANCE STATUS: 2 - Symptomatic, <50% confined to bed  Filed Vitals:   02/01/14 1158  BP: 141/94  Pulse: 56  Temp: 98.2 F (36.8 C)  Resp: 18   Filed Weights   02/01/14 1158  Weight: 122 lb 3.2 oz (55.43 kg)    GENERAL:alert, no distress and comfortable. He looks thin and mildly cachectic SKIN: skin color, texture, turgor are normal, no rashes or significant lesions EYES: normal, Conjunctiva are pink and non-injected, sclera clear OROPHARYNX:no exudate, no erythema and lips, buccal mucosa, and tongue normal . There is some abnormal change in the left buccal mucosa but no oral thrush NECK: Thick and hard from prior radiation. Thyroid normal size, non-tender, without nodularity LYMPH:  no palpable lymphadenopathy in the cervical, axillary or inguinal LUNGS: clear to auscultation and percussion with normal breathing effort HEART: regular rate & rhythm and no murmurs and no lower extremity edema ABDOMEN:abdomen soft, non-tender and normal bowel sounds. Feeding tube looks okay Musculoskeletal:no cyanosis of digits and no clubbing  NEURO: alert & oriented x 3 with fluent speech, no focal motor/sensory deficits  LABORATORY DATA:  I have reviewed the data as listed    Component Value Date/Time   NA 139 01/23/2014 1741   NA 142 01/18/2014 1115   NA 140 04/24/2012 1200   K 4.0 01/23/2014 1741   K 4.0 01/18/2014 1115   K 4.1 04/24/2012 1200   CL 99 01/23/2014 1741   CL 102 02/27/2013 1045   CL 98 04/24/2012 1200   CO2 25 01/23/2014 1741    CO2 28 01/18/2014 1115   CO2 30 04/24/2012 1200   GLUCOSE 94 01/23/2014 1741   GLUCOSE 97 01/18/2014 1115   GLUCOSE 118* 02/27/2013 1045   GLUCOSE 102 04/24/2012 1200   BUN 9 01/23/2014 1741   BUN 9.1 01/18/2014 1115   BUN 13 04/24/2012 1200   CREATININE 0.64 01/23/2014 1741   CREATININE 0.8 01/18/2014 1115   CREATININE 0.6 04/24/2012 1200   CALCIUM 9.8 01/23/2014 1741   CALCIUM 9.8 01/18/2014 1115   CALCIUM 9.6 04/24/2012 1200   PROT 7.9 01/23/2014 1741   PROT 8.2 01/18/2014 1115   PROT 8.1 04/24/2012 1200   ALBUMIN 4.5 01/23/2014 1741   ALBUMIN 4.4 01/18/2014 1115   AST 23 01/23/2014 1741   AST 21 01/18/2014 1115   AST 30 04/24/2012 1200   ALT 9 01/23/2014 1741   ALT 9 01/18/2014 1115   ALT 27 04/24/2012 1200   ALKPHOS 84 01/23/2014 1741   ALKPHOS 85 01/18/2014 1115  ALKPHOS 77 04/24/2012 1200   BILITOT 0.5 01/23/2014 1741   BILITOT 0.37 01/18/2014 1115   BILITOT 0.40 04/24/2012 1200   GFRNONAA >90 01/23/2014 1741   GFRAA >90 01/23/2014 1741    No results found for this basename: SPEP,  UPEP,   kappa and lambda light chains    Lab Results  Component Value Date   WBC 3.6* 01/23/2014   NEUTROABS 2.0 01/23/2014   HGB 12.5* 01/23/2014   HCT 36.2* 01/23/2014   MCV 98.9 01/23/2014   PLT 114* 01/23/2014      Chemistry      Component Value Date/Time   NA 139 01/23/2014 1741   NA 142 01/18/2014 1115   NA 140 04/24/2012 1200   K 4.0 01/23/2014 1741   K 4.0 01/18/2014 1115   K 4.1 04/24/2012 1200   CL 99 01/23/2014 1741   CL 102 02/27/2013 1045   CL 98 04/24/2012 1200   CO2 25 01/23/2014 1741   CO2 28 01/18/2014 1115   CO2 30 04/24/2012 1200   BUN 9 01/23/2014 1741   BUN 9.1 01/18/2014 1115   BUN 13 04/24/2012 1200   CREATININE 0.64 01/23/2014 1741   CREATININE 0.8 01/18/2014 1115   CREATININE 0.6 04/24/2012 1200      Component Value Date/Time   CALCIUM 9.8 01/23/2014 1741   CALCIUM 9.8 01/18/2014 1115   CALCIUM 9.6 04/24/2012 1200   ALKPHOS 84 01/23/2014 1741   ALKPHOS 85 01/18/2014 1115   ALKPHOS 77  04/24/2012 1200   AST 23 01/23/2014 1741   AST 21 01/18/2014 1115   AST 30 04/24/2012 1200   ALT 9 01/23/2014 1741   ALT 9 01/18/2014 1115   ALT 27 04/24/2012 1200   BILITOT 0.5 01/23/2014 1741   BILITOT 0.37 01/18/2014 1115   BILITOT 0.40 04/24/2012 1200       RADIOGRAPHIC STUDIES: I reviewed the imaging study myself I have personally reviewed the radiological images as listed and agreed with the findings in the report. Nm Bone Scan Whole Body  01/31/2014   CLINICAL DATA:  History of tonsillar carcinoma. Evaluate for metastatic disease. Sclerotic area in T4 and T6 on CT.  EXAM: NUCLEAR MEDICINE WHOLE BODY BONE SCAN  TECHNIQUE: Whole body anterior and posterior images were obtained approximately 3 hours after intravenous injection of radiopharmaceutical.  RADIOPHARMACEUTICALS:  25 mCi Technetium-99 MDP  COMPARISON:  CT neck and chest 01/25/2014 and CT chest 07/03/2010  FINDINGS: Expected activity is seen in the bilateral kidneys and in the urinary bladder. There are no suspicious areas of uptake in the skeleton to suggest bony metastatic disease. Specifically, no abnormal/increased uptake is seen in the thoracic spine vertebral bodies.  IMPRESSION: No evidence of bony metastatic disease.   Electronically Signed   By: Curlene Dolphin M.D.   On: 01/31/2014 15:22      ASSESSMENT & PLAN:  #1 tonsillar cancer Clinically he has no evidence of recurrence.  He has lost some weight. Imaging study did not show any cancer. I recommend he continue followup in 4 months. #2 abnormal change in the left buccal mucosa I will continue checking on this in the next clinic visit. #3 history of recurrent pancreatitis with severe abdominal pain CT scan show chronic pancreatitis. #4 hypothyroidism I plan to increase his thyroid supplement due to signs and symptoms of hypothyroidism. #5 chronic pain I would defer to his primary care provider for pain management #6 history of osteonecrosis of the jaw He still have  persistent pain with  difficulty swallowing food. He continue his followup with dentist #7 tobacco abuse I spent some time counseling the patient about the importance of nicotine cessation but he is not motivated to quit for now. #8 severe constipation I recommend starting him on daily laxatives. Some of his pain could be due to severe constipation #9 abnormal lung changes I will repeat CT scan of the chest with his next visit.   Orders Placed This Encounter  Procedures  . CT Chest Wo Contrast    Standing Status: Future     Number of Occurrences:      Standing Expiration Date: 04/03/2015    Order Specific Question:  Reason for Exam (SYMPTOM  OR DIAGNOSIS REQUIRED)    Answer:  nodule base of lung, hx tonsil ca    Order Specific Question:  Preferred imaging location?    Answer:  [facility]  . CBC with Differential    Standing Status: Future     Number of Occurrences:      Standing Expiration Date: 02/01/2015  . Comprehensive metabolic panel    Standing Status: Future     Number of Occurrences:      Standing Expiration Date: 02/01/2015  . T4, free    Standing Status: Future     Number of Occurrences:      Standing Expiration Date: 02/01/2015  . TSH    Standing Status: Future     Number of Occurrences:      Standing Expiration Date: 02/01/2015   All questions were answered. The patient knows to call the clinic with any problems, questions or concerns. No barriers to learning was detected. I spent 40 minutes counseling the patient face to face. The total time spent in the appointment was 60 minutes and more than 50% was on counseling and review of test results     Heath Lark, MD 02/01/2014 2:30 PM

## 2014-02-01 NOTE — Telephone Encounter (Signed)
gve the pt his sept 2015 appt calendar. Pt aware he will be called by rad dept regarding the ct scan appt.

## 2014-02-04 ENCOUNTER — Encounter (HOSPITAL_COMMUNITY): Payer: Medicaid Other

## 2014-02-21 ENCOUNTER — Other Ambulatory Visit (HOSPITAL_COMMUNITY): Payer: Self-pay | Admitting: Dentistry

## 2014-02-22 ENCOUNTER — Other Ambulatory Visit: Payer: Self-pay | Admitting: Hematology and Oncology

## 2014-02-22 ENCOUNTER — Telehealth: Payer: Self-pay | Admitting: *Deleted

## 2014-02-22 DIAGNOSIS — C099 Malignant neoplasm of tonsil, unspecified: Secondary | ICD-10-CM

## 2014-02-22 MED ORDER — MAGIC MOUTHWASH W/LIDOCAINE: 5.0000 mL | Freq: Four times a day (QID) | ORAL | Status: DC | PRN

## 2014-02-22 NOTE — Telephone Encounter (Signed)
Informed sister of rx sent to [facility]. She verbalized understanding.

## 2014-02-22 NOTE — Telephone Encounter (Signed)
Sister states pt requests refill on magic mouthwash to [facility] on [address]

## 2014-02-26 ENCOUNTER — Telehealth: Payer: Self-pay | Admitting: *Deleted

## 2014-02-26 NOTE — Telephone Encounter (Signed)
Informed sister and pt Dr. Alvy Bimler does not prescribed plain lidocaine due to risk of aspiration.  Pt verbalized understanding.  He c/o ongoing mouth soreness and some swelling.  Instructed pt to use the Magic Mouthwash w/ lidocaine as prescribed.  Pt has appt to see Dr. Lucia Gaskins in 2 weeks.  Encouraged pt to keep appt w/ Dr. Lucia Gaskins to assess his mouth.  He verbalized understanding.

## 2014-02-26 NOTE — Telephone Encounter (Signed)
Plain viscous lidocaine is not recommended due to high risk of aspiration

## 2014-02-26 NOTE — Telephone Encounter (Signed)
Pt requests refill on Viscous Lidocaine for his mouth sores.   Dr. Alvy Bimler refilled the magic mouthwash last week, but sister called today to state what pt really wanted refilled is the plain Lidocaine.

## 2014-02-28 ENCOUNTER — Inpatient Hospital Stay (HOSPITAL_COMMUNITY)
Admission: EM | Admit: 2014-02-28 | Discharge: 2014-03-05 | DRG: 439 | Disposition: A | Discharge status: Home / Self Care | Payer: Medicaid Other | Attending: Internal Medicine | Admitting: Internal Medicine

## 2014-02-28 ENCOUNTER — Emergency Department (HOSPITAL_COMMUNITY): Payer: Medicaid Other

## 2014-02-28 ENCOUNTER — Encounter (HOSPITAL_COMMUNITY): Payer: Self-pay | Admitting: Emergency Medicine

## 2014-02-28 DIAGNOSIS — Z85819 Personal history of malignant neoplasm of unspecified site of lip, oral cavity, and pharynx: Secondary | ICD-10-CM

## 2014-02-28 DIAGNOSIS — F17200 Nicotine dependence, unspecified, uncomplicated: Secondary | ICD-10-CM | POA: Diagnosis present

## 2014-02-28 DIAGNOSIS — K861 Other chronic pancreatitis: Secondary | ICD-10-CM

## 2014-02-28 DIAGNOSIS — D61818 Other pancytopenia: Secondary | ICD-10-CM

## 2014-02-28 DIAGNOSIS — E8779 Other fluid overload: Secondary | ICD-10-CM | POA: Diagnosis not present

## 2014-02-28 DIAGNOSIS — G893 Neoplasm related pain (acute) (chronic): Secondary | ICD-10-CM | POA: Diagnosis present

## 2014-02-28 DIAGNOSIS — I10 Essential (primary) hypertension: Secondary | ICD-10-CM

## 2014-02-28 DIAGNOSIS — J438 Other emphysema: Secondary | ICD-10-CM | POA: Diagnosis present

## 2014-02-28 DIAGNOSIS — R590 Localized enlarged lymph nodes: Secondary | ICD-10-CM

## 2014-02-28 DIAGNOSIS — Z923 Personal history of irradiation: Secondary | ICD-10-CM

## 2014-02-28 DIAGNOSIS — R1013 Epigastric pain: Secondary | ICD-10-CM | POA: Diagnosis present

## 2014-02-28 DIAGNOSIS — Z9089 Acquired absence of other organs: Secondary | ICD-10-CM

## 2014-02-28 DIAGNOSIS — C099 Malignant neoplasm of tonsil, unspecified: Secondary | ICD-10-CM

## 2014-02-28 DIAGNOSIS — K859 Acute pancreatitis without necrosis or infection, unspecified: Principal | ICD-10-CM

## 2014-02-28 DIAGNOSIS — Z931 Gastrostomy status: Secondary | ICD-10-CM

## 2014-02-28 DIAGNOSIS — K449 Diaphragmatic hernia without obstruction or gangrene: Secondary | ICD-10-CM

## 2014-02-28 DIAGNOSIS — E039 Hypothyroidism, unspecified: Secondary | ICD-10-CM

## 2014-02-28 DIAGNOSIS — Z79899 Other long term (current) drug therapy: Secondary | ICD-10-CM

## 2014-02-28 DIAGNOSIS — IMO0002 Reserved for concepts with insufficient information to code with codable children: Secondary | ICD-10-CM

## 2014-02-28 DIAGNOSIS — F1210 Cannabis abuse, uncomplicated: Secondary | ICD-10-CM | POA: Diagnosis present

## 2014-02-28 DIAGNOSIS — R109 Unspecified abdominal pain: Secondary | ICD-10-CM

## 2014-02-28 DIAGNOSIS — Z720 Tobacco use: Secondary | ICD-10-CM

## 2014-02-28 DIAGNOSIS — R0902 Hypoxemia: Secondary | ICD-10-CM | POA: Diagnosis not present

## 2014-02-28 HISTORY — DX: Other pancytopenia: D61.818

## 2014-02-28 LAB — CBC WITH DIFFERENTIAL/PLATELET
BASOS PCT: 0 % (ref 0–1)
Basophils Absolute: 0 10*3/uL (ref 0.0–0.1)
EOS ABS: 0.1 10*3/uL (ref 0.0–0.7)
Eosinophils Relative: 2 % (ref 0–5)
HEMATOCRIT: 40.5 % (ref 39.0–52.0)
Hemoglobin: 13.8 g/dL (ref 13.0–17.0)
LYMPHS PCT: 21 % (ref 12–46)
Lymphs Abs: 0.7 10*3/uL (ref 0.7–4.0)
MCH: 34 pg (ref 26.0–34.0)
MCHC: 34.1 g/dL (ref 30.0–36.0)
MCV: 99.8 fL (ref 78.0–100.0)
MONOS PCT: 18 % — AB (ref 3–12)
Monocytes Absolute: 0.6 10*3/uL (ref 0.1–1.0)
NEUTROS ABS: 1.9 10*3/uL (ref 1.7–7.7)
Neutrophils Relative %: 59 % (ref 43–77)
Platelets: DECREASED 10*3/uL (ref 150–400)
RBC: 4.06 MIL/uL — ABNORMAL LOW (ref 4.22–5.81)
RDW: 13.3 % (ref 11.5–15.5)
WBC: 3.3 10*3/uL — ABNORMAL LOW (ref 4.0–10.5)

## 2014-02-28 LAB — TSH: TSH: 1.93 u[IU]/mL (ref 0.350–4.500)

## 2014-02-28 LAB — URINALYSIS, ROUTINE W REFLEX MICROSCOPIC
Bilirubin Urine: NEGATIVE
Glucose, UA: NEGATIVE mg/dL
HGB URINE DIPSTICK: NEGATIVE
KETONES UR: NEGATIVE mg/dL
Leukocytes, UA: NEGATIVE
Nitrite: NEGATIVE
PROTEIN: NEGATIVE mg/dL
Specific Gravity, Urine: 1.009 (ref 1.005–1.030)
Urobilinogen, UA: 0.2 mg/dL (ref 0.0–1.0)
pH: 6.5 (ref 5.0–8.0)

## 2014-02-28 LAB — COMPREHENSIVE METABOLIC PANEL
ALT: 12 U/L (ref 0–53)
AST: 24 U/L (ref 0–37)
Albumin: 4.4 g/dL (ref 3.5–5.2)
Alkaline Phosphatase: 78 U/L (ref 39–117)
BUN: 10 mg/dL (ref 6–23)
CO2: 28 meq/L (ref 19–32)
Calcium: 10 mg/dL (ref 8.4–10.5)
Chloride: 97 mEq/L (ref 96–112)
Creatinine, Ser: 0.6 mg/dL (ref 0.50–1.35)
GFR calc Af Amer: >90 mL/min (ref >90)
Glucose, Bld: 106 mg/dL — ABNORMAL HIGH (ref 70–99)
POTASSIUM: 4.3 meq/L (ref 3.7–5.3)
Sodium: 139 mEq/L (ref 137–147)
Total Bilirubin: 0.4 mg/dL (ref 0.3–1.2)
Total Protein: 8.3 g/dL (ref 6.0–8.3)

## 2014-02-28 LAB — MRSA PCR SCREENING: MRSA BY PCR: POSITIVE — AB

## 2014-02-28 LAB — LIPASE, BLOOD: LIPASE: 12 U/L (ref 11–59)

## 2014-02-28 MED ORDER — ONDANSETRON HCL 4 MG/2ML IJ SOLN
4.0000 mg | Freq: Once | INTRAMUSCULAR | Status: AC
  Administered 2014-02-28: 4 mg via INTRAVENOUS
  Filled 2014-02-28: qty 2

## 2014-02-28 MED ORDER — METOPROLOL TARTRATE 50 MG PO TABS
50.0000 mg | ORAL_TABLET | Freq: Two times a day (BID) | ORAL | Status: DC
  Administered 2014-03-01 – 2014-03-05 (×9): 50 mg
  Filled 2014-02-28 (×10): qty 1

## 2014-02-28 MED ORDER — SODIUM CHLORIDE 0.9 % IV SOLN
INTRAVENOUS | Status: DC
  Administered 2014-02-28 – 2014-03-03 (×8): via INTRAVENOUS

## 2014-02-28 MED ORDER — FENTANYL 50 MCG/HR TD PT72
50.0000 ug | MEDICATED_PATCH | TRANSDERMAL | Status: DC
  Administered 2014-02-28 – 2014-03-03 (×2): 50 ug via TRANSDERMAL
  Filled 2014-02-28 (×2): qty 1

## 2014-02-28 MED ORDER — TEMAZEPAM 15 MG PO CAPS
30.0000 mg | ORAL_CAPSULE | Freq: Every evening | ORAL | Status: DC | PRN
  Administered 2014-02-28 – 2014-03-05 (×5): 30 mg via ORAL
  Filled 2014-02-28 (×2): qty 4
  Filled 2014-02-28 (×2): qty 2
  Filled 2014-02-28: qty 4

## 2014-02-28 MED ORDER — POLYETHYLENE GLYCOL 3350 17 G PO PACK
17.0000 g | PACK | Freq: Every day | ORAL | Status: DC
  Administered 2014-03-01 – 2014-03-05 (×5): 17 g via ORAL
  Filled 2014-02-28 (×6): qty 1

## 2014-02-28 MED ORDER — NICOTINE 21 MG/24HR TD PT24
21.0000 mg | MEDICATED_PATCH | Freq: Every day | TRANSDERMAL | Status: DC
  Administered 2014-02-28 – 2014-03-05 (×6): 21 mg via TRANSDERMAL
  Filled 2014-02-28 (×6): qty 1

## 2014-02-28 MED ORDER — OSMOLITE 1.5 CAL PO LIQD
237.0000 mL | Freq: Every day | ORAL | Status: DC
  Administered 2014-02-28 – 2014-03-01 (×2): 237 mL via ORAL
  Filled 2014-02-28 (×2): qty 237

## 2014-02-28 MED ORDER — HYDROMORPHONE HCL PF 1 MG/ML IJ SOLN
1.0000 mg | Freq: Once | INTRAMUSCULAR | Status: AC
  Administered 2014-02-28: 1 mg via INTRAVENOUS
  Filled 2014-02-28: qty 1

## 2014-02-28 MED ORDER — HYDROMORPHONE HCL PF 1 MG/ML IJ SOLN
1.0000 mg | INTRAMUSCULAR | Status: DC | PRN
  Administered 2014-03-01 (×2): 1 mg via INTRAVENOUS
  Filled 2014-02-28 (×2): qty 1

## 2014-02-28 MED ORDER — ACETAMINOPHEN 650 MG RE SUPP: 650.0000 mg | Freq: Four times a day (QID) | RECTAL | Status: DC | PRN

## 2014-02-28 MED ORDER — METOPROLOL TARTRATE 50 MG PO TABS
50.0000 mg | ORAL_TABLET | Freq: Two times a day (BID) | ORAL | Status: DC
  Administered 2014-02-28: 50 mg via ORAL
  Filled 2014-02-28: qty 1

## 2014-02-28 MED ORDER — ONDANSETRON HCL 4 MG/2ML IJ SOLN
4.0000 mg | Freq: Four times a day (QID) | INTRAMUSCULAR | Status: DC | PRN
  Administered 2014-03-02 – 2014-03-03 (×2): 4 mg via INTRAVENOUS
  Filled 2014-02-28 (×3): qty 2

## 2014-02-28 MED ORDER — ONDANSETRON HCL 4 MG/2ML IJ SOLN: 4.0000 mg | Freq: Three times a day (TID) | INTRAMUSCULAR | Status: AC | PRN

## 2014-02-28 MED ORDER — IOHEXOL 300 MG/ML  SOLN
50.0000 mL | Freq: Once | INTRAMUSCULAR | Status: AC | PRN
  Administered 2014-02-28: 50 mL via ORAL

## 2014-02-28 MED ORDER — PANTOPRAZOLE SODIUM 40 MG PO PACK
80.0000 mg | PACK | Freq: Every day | ORAL | Status: DC
  Administered 2014-02-28 – 2014-03-05 (×6): 80 mg via ORAL
  Filled 2014-02-28 (×7): qty 40

## 2014-02-28 MED ORDER — IOHEXOL 300 MG/ML  SOLN
100.0000 mL | Freq: Once | INTRAMUSCULAR | Status: AC | PRN
  Administered 2014-02-28: 100 mL via INTRAVENOUS

## 2014-02-28 MED ORDER — OXYCODONE HCL 5 MG PO TABS
15.0000 mg | ORAL_TABLET | ORAL | Status: DC
  Administered 2014-02-28 – 2014-03-05 (×28): 15 mg via ORAL
  Filled 2014-02-28 (×29): qty 3

## 2014-02-28 MED ORDER — SODIUM CHLORIDE 0.9 % IV SOLN: INTRAVENOUS | Status: AC

## 2014-02-28 MED ORDER — LEVOTHYROXINE SODIUM 50 MCG PO TABS
50.0000 ug | ORAL_TABLET | Freq: Every day | ORAL | Status: DC
  Administered 2014-03-01 – 2014-03-05 (×5): 50 ug
  Filled 2014-02-28 (×6): qty 1

## 2014-02-28 MED ORDER — PANCRELIPASE (LIP-PROT-AMYL) 12000-38000 UNITS PO CPEP
1.0000 | ORAL_CAPSULE | Freq: Three times a day (TID) | ORAL | Status: DC
  Administered 2014-02-28 – 2014-03-05 (×19): 1 via ORAL
  Filled 2014-02-28 (×24): qty 1

## 2014-02-28 MED ORDER — PANTOPRAZOLE SODIUM 40 MG PO TBEC
80.0000 mg | DELAYED_RELEASE_TABLET | Freq: Every day | ORAL | Status: DC
  Filled 2014-02-28: qty 2

## 2014-02-28 MED ORDER — HYDROMORPHONE HCL PF 1 MG/ML IJ SOLN
1.0000 mg | INTRAMUSCULAR | Status: AC | PRN
  Administered 2014-02-28 – 2014-03-01 (×3): 1 mg via INTRAVENOUS
  Filled 2014-02-28 (×3): qty 1

## 2014-02-28 MED ORDER — SODIUM CHLORIDE 0.9 % IV BOLUS (SEPSIS)
1000.0000 mL | INTRAVENOUS | Status: AC
  Administered 2014-02-28: 1000 mL via INTRAVENOUS

## 2014-02-28 MED ORDER — MORPHINE SULFATE 4 MG/ML IJ SOLN
4.0000 mg | Freq: Once | INTRAMUSCULAR | Status: AC
  Administered 2014-02-28: 4 mg via INTRAVENOUS
  Filled 2014-02-28: qty 1

## 2014-02-28 MED ORDER — ACETAMINOPHEN 325 MG PO TABS
650.0000 mg | ORAL_TABLET | Freq: Four times a day (QID) | ORAL | Status: DC | PRN
Start: 2014-02-28 — End: 2014-03-05
  Administered 2014-03-03: 650 mg via ORAL
  Filled 2014-02-28: qty 2

## 2014-02-28 MED ORDER — ONDANSETRON HCL 4 MG PO TABS
4.0000 mg | ORAL_TABLET | Freq: Four times a day (QID) | ORAL | Status: DC | PRN
  Administered 2014-03-04: 4 mg via ORAL

## 2014-02-28 MED ORDER — LEVOTHYROXINE SODIUM 50 MCG PO TABS
50.0000 ug | ORAL_TABLET | Freq: Every day | ORAL | Status: DC
  Filled 2014-02-28: qty 1

## 2014-02-28 NOTE — ED Provider Notes (Signed)
CSN: [id number]     Arrival date & time 02/28/14  0920 History   First MD Initiated Contact with Patient 02/28/14 1151     Chief Complaint  Patient presents with  . Abdominal Pain     (Consider location/radiation/quality/duration/timing/severity/associated sxs/prior Treatment) HPI Comments: Patient is a 58 year old male with a past medical history of tonsillar cancer, hypertension, and chronic pancreatitis who presents with abdominal pain for the past week. The pain is located in his epigastrium and does not radiate. The pain is described as aching and severe. The pain started gradually and progressively worsened since the onset. No alleviating/aggravating factors. The patient has tried home pain medications for symptoms without relief. Associated symptoms include nausea. Patient denies fever, headache, vomiting, diarrhea, chest pain, SOB, dysuria, constipation. Patient has a history of cholecystectomy.   Patient is a 58 y.o. male presenting with abdominal pain.  Abdominal Pain Associated symptoms: nausea   Associated symptoms: no chest pain, no chills, no diarrhea, no dysuria, no fatigue, no fever, no shortness of breath and no vomiting     Past Medical History  Diagnosis Date  . Hypertension   . Chronic pancreatitis   . Arthritis   . Hx of radiation therapy 02/02/11 to 03/22/11    L tonsil  . Seizures     alcohol-related  . Cervical adenopathy 04/26/2012  . Urinary hesitancy   . Constipation   . Cancer 12/15/10    left tonsillar squamous cell carcinoma  . Tonsillar cancer     tonsillar ca dx 12/27/10  . 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 Shortness of breath 05/23/2012    "lying down"  . History of blood transfusion ~ 2004    "from the pancreatitis"  . Daily headache 05/23/2012    "small ones"  . History of radiation therapy 02/02/2011-03/22/2011    head/neck,left tonsil  . G tube feedings     has a feeding tube in stomach  . Hypothyroidism 10/29/2013  . Thrush  11/02/2013   Past Surgical History  Procedure Laterality Date  . Bile duct stent placement      hx of  . Radical neck dissection  05/23/2012    w/mass excision  . Cholecystectomy  04/2005  . Tibia fracture surgery  1990's    left  . Fracture surgery    . Direct laryngoscopy  05/23/2012    Procedure: DIRECT LARYNGOSCOPY;  Surgeon: Rozetta Nunnery, MD;  Location: [facility];  Service: ENT;  Laterality: N/A;  . Mass biopsy  05/23/2012    Procedure: NECK MASS BIOPSY;  Surgeon: Rozetta Nunnery, MD;  Location: [facility];  Service: ENT;  Laterality: N/A;  . Radical neck dissection  05/23/2012    Procedure: RADICAL NECK DISSECTION;  Surgeon: Rozetta Nunnery, MD;  Location: [facility];  Service: ENT;  Laterality: Left;  . Direct laryngoscopy N/A 03/23/2013    Procedure: DIRECT LARYNGOSCOPY;  Surgeon: Rozetta Nunnery, MD;  Location: [facility];  Service: ENT;  Laterality: N/A;  . Esophageal dilation N/A 03/23/2013    Procedure: ESOPHAGEAL DILATION;  Surgeon: Rozetta Nunnery, MD;  Location: [facility];  Service: ENT;  Laterality: N/A;   Family History  Problem Relation Age of Onset  . COPD Mother    History  Substance Use Topics  . Smoking status: Current Every Day Smoker -- 0.25 packs/day for 40 years    Types: Cigarettes  . Smokeless tobacco: Never Used  Comment: hx 1/2 -1 PPD  . Alcohol Use: No     Comment: 05/23/2012 hx alcohol abuse, quit ~ 2000    Review of Systems  Constitutional: Negative for fever, chills and fatigue.  HENT: Negative for trouble swallowing.   Eyes: Negative for visual disturbance.  Respiratory: Negative for shortness of breath.   Cardiovascular: Negative for chest pain and palpitations.  Gastrointestinal: Positive for nausea and abdominal pain. Negative for vomiting and diarrhea.  Genitourinary: Negative for dysuria and difficulty urinating.  Musculoskeletal: Negative for arthralgias and neck pain.  Skin: Negative for  color change.  Neurological: Negative for dizziness and weakness.  Psychiatric/Behavioral: Negative for dysphoric mood.      Allergies  Review of patient's allergies indicates no known allergies.  Home Medications   Prior to Admission medications   Medication Sig Start Date End Date Taking? Authorizing Provider  Alum & Mag Hydroxide-Simeth (MAGIC MOUTHWASH W/LIDOCAINE) SOLN Take 5 mLs by mouth every 6 (six) hours as needed (Swish and Spit). 02/22/14   Heath Lark, MD  chlorhexidine (PERIDEX) 0.12 % solution Use as directed 15 mLs in the mouth or throat 3 (three) times daily. Use for 30 seconds each time.    Historical Provider, MD  chlorhexidine (PERIDEX) 0.12 % solution SEE NOTES 02/21/14   Lenn Cal, DDS  Cholecalciferol (VITAMIN D PO) Take 1 tablet by mouth daily.    Historical Provider, MD  doxylamine, Sleep, (UNISOM) 25 MG tablet Take 50 mg by mouth at bedtime as needed for sleep.    Historical Provider, MD  fentaNYL (DURAGESIC - DOSED MCG/HR) 50 MCG/HR Place 1 patch onto the skin every 3 (three) days.    Historical Provider, MD  fluconazole (DIFLUCAN) 100 MG tablet Take 100 mg by mouth daily. 11/02/13   Heath Lark, MD  levothyroxine (SYNTHROID, LEVOTHROID) 50 MCG tablet TAKE 1 TABLET BY MOUTH DAILY BEFORE BREAKFAST 02/01/14   Heath Lark, MD  Lidocaine HCl 2 % SOLN Swish and spit 5 mLs every 6 (six) hours as needed (mouth sores). 08/27/13   Heath Lark, MD  lipase/protease/amylase (CREON-12/PANCREASE) 12000 UNITS CPEP capsule Take 1 capsule by mouth 4 (four) times daily.    Historical Provider, MD  metoprolol (LOPRESSOR) 50 MG tablet Take 50 mg by mouth 2 (two) times daily.    Historical Provider, MD  Nutritional Supplements (ENSURE PO) Take 1 Can by mouth 3 (three) times daily.    Historical Provider, MD  Nutritional Supplements (FEEDING SUPPLEMENT, OSMOLITE 1.5 CAL,) LIQD Take 237 mLs by mouth daily. Drinks 4 cans per day    Historical Provider, MD  Omega-3 Fatty Acids (FISH OIL  PO) Take 1 capsule by mouth daily.    Historical Provider, MD  omeprazole (PRILOSEC) 40 MG capsule Take 40 mg by mouth daily.     Historical Provider, MD  oxyCODONE (ROXICODONE) 15 MG immediate release tablet Take 15 mg by mouth every 4 (four) hours.     Historical Provider, MD  polyethylene glycol (MIRALAX) packet Take 17 g by mouth daily. 02/01/14   Heath Lark, MD  promethazine (PHENERGAN) 25 MG tablet Take 1 tablet (25 mg total) by mouth every 6 (six) hours as needed. For nausea 08/28/12   Maryanna Shape, NP  temazepam (RESTORIL) 30 MG capsule Take 30 mg by mouth at bedtime as needed for sleep.    Historical Provider, MD   BP 161/90  Pulse 80  Temp(Src) 98.6 F (37 C) (Oral)  Resp 18  SpO2 100% Physical Exam  Nursing note  and vitals reviewed. Constitutional: He is oriented to person, place, and time. He appears well-developed and well-nourished. No distress.  HENT:  Head: Normocephalic and atraumatic.  Eyes: Conjunctivae and EOM are normal.  Neck: Normal range of motion.  Cardiovascular: Normal rate and regular rhythm.  Exam reveals no gallop and no friction rub.   No murmur heard. Pulmonary/Chest: Effort normal and breath sounds normal. He has no wheezes. He has no rales. He exhibits no tenderness.  Abdominal: Soft. He exhibits no distension. There is tenderness. There is no rebound and no guarding.  Epigastric tenderness to palpation. No other focal tenderness or peritoneal signs.   Musculoskeletal: Normal range of motion.  Neurological: He is alert and oriented to person, place, and time. Coordination normal.  Speech is goal-oriented. Moves limbs without ataxia.   Skin: Skin is warm and dry.  Psychiatric: He has a normal mood and affect. His behavior is normal.    ED Course  Procedures (including critical care time) Labs Review Labs Reviewed  CBC WITH DIFFERENTIAL - Abnormal; Notable for the following:    WBC 3.3 (*)    RBC 4.06 (*)    Monocytes Relative 18 (*)    All  other components within normal limits  COMPREHENSIVE METABOLIC PANEL - Abnormal; Notable for the following:    Glucose, Bld 106 (*)    All other components within normal limits  LIPASE, BLOOD  URINALYSIS, ROUTINE W REFLEX MICROSCOPIC    Imaging Review No results found.   EKG Interpretation None      MDM   Final diagnoses:  Abdominal pain    12:00 PM Labs and urinalysis unremarkable for acute changes. Patient will have CT abdomen pelvis to further evaluate his pain. Vitals stable and patient afebrile. Patient will have morphine and zofran for symptoms.   1:06 PM Patient signed out to Jeannett Senior, [place], PA-C 02/28/14 [phone]

## 2014-02-28 NOTE — ED Provider Notes (Addendum)
Patient reports he started getting upper abdominal discomfort about 2 weeks ago. He has had nausea without vomiting. He denies any fever. He does report feeling very weak. Patient is in pain management indicates that a lot of infant now patches. He reports his pain is not relieved by the meds.  Patient is a small thin male who looks uncomfortable. Abdominal exam deferred to the PA.   Medical screening examination/treatment/procedure(s) were conducted as a shared visit with non-physician practitioner(s) and myself.  I personally evaluated the patient during the encounter.   EKG Interpretation None         Janice Norrie, MD 02/28/14 [place] Renold Kozar, MD 02/28/14 1515

## 2014-02-28 NOTE — ED Provider Notes (Signed)
2:14 PM  This is signed out to me at shift change. Patient with history of chronic abdominal pain 2 chronic pancreatitis. He is in pain management. She takes Dilaudid and Tylenol patch daily for his pain. Patient came in saying that his pain is worsening. He admits to nausea, denies vomiting. Patient states she's currently taking Dilaudid with no relief of his pain. CT of the abdomen and pelvis showed acute on chronic pancreatitis. Blood in her primary fluids, pain management.  2:43 PM Spoke with [facility], will admit  Filed Vitals:   02/28/14 1240  BP:   Pulse: 55  Temp: 97.5 F (36.4 C)  Resp: 16     Redding Cloe A Sylva Overley, PA-C 02/28/14 1443

## 2014-02-28 NOTE — H&P (Signed)
History and Physical       Hospital Admission Note Date: 02/28/2014  Patient name: Russell Williamson Medical record number: [id number] Date of birth: 12-26-1955 Age: 58 y.o. Gender: male  PCP: Harvie Junior, MD  Primary oncologist: Dr Alvy Bimler  Chief Complaint:  Abdominal pain for last 2 weeks  HPI: Patient is a 58 year old male with history of chronic pancreatitis, tonsillar cancer, hypertension presented to the ER with epigastric abdominal pain for last 2 weeks. History was obtained from the patient who reported that he has a history of chronic pancreatitis and is on chronic pain medications for that. He has been having epigastric abdominal pain with nausea, no vomiting, no radiation, no fevers or chills or any diarrhea. Patient reported that it is similar to his prior episodes of pancreatitis, hence he had been eating a liquid diet and drinking lot of fluids at home. Patient reports that his symptoms has not been improving with his home medications. He has history of cholecystectomy.  Review of Systems:  Constitutional: Denies fever, chills, diaphoresis, +poor appetite and fatigue.  HEENT: Denies photophobia, eye pain, redness, hearing loss, ear pain, congestion, sore throat, rhinorrhea, sneezing, mouth sores, trouble swallowing, neck pain, neck stiffness and tinnitus.   Respiratory: Denies SOB, DOE, cough, chest tightness,  and wheezing.   Cardiovascular: Denies chest pain, palpitations and leg swelling.  Gastrointestinal: Please see history of present illness.  Genitourinary: Denies dysuria, urgency, frequency, hematuria, flank pain and difficulty urinating.  Musculoskeletal: Denies myalgias, back pain, joint swelling, arthralgias and gait problem.  Skin: Denies pallor, rash and wound.  Neurological: Denies dizziness, seizures, syncope, weakness, light-headedness, numbness and headaches.  Hematological: Denies adenopathy. Easy  bruising, personal or family bleeding history  Psychiatric/Behavioral: Denies suicidal ideation, mood changes, confusion, nervousness, sleep disturbance and agitation  Past Medical History: Past Medical History  Diagnosis Date  . Hypertension   . Chronic pancreatitis   . Arthritis   . Hx of radiation therapy 02/02/11 to 03/22/11    L tonsil  . Seizures     alcohol-related  . Cervical adenopathy 04/26/2012  . Urinary hesitancy   . Constipation   . Shortness of breath 05/23/2012    "lying down"  . History of blood transfusion ~ 2004    "from the pancreatitis"  . Daily headache 05/23/2012    "small ones"  . History of radiation therapy 02/02/2011-03/22/2011    head/neck,left tonsil  . G tube feedings     has a feeding tube in stomach  . Hypothyroidism 10/29/2013  . Thrush 11/02/2013  . Cancer 12/15/10    left tonsillar squamous cell carcinoma  . Tonsillar cancer     tonsillar ca dx 12/27/10  . 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 Bile duct stent placement      hx of  . Radical neck dissection  05/23/2012    w/mass excision  . Cholecystectomy  04/2005  . Tibia fracture surgery  1990's    left  . Fracture surgery    . Direct laryngoscopy  05/23/2012    Procedure: DIRECT LARYNGOSCOPY;  Surgeon: Rozetta Nunnery, MD;  Location: [place];  Service: ENT;  Laterality: N/A;  . Mass biopsy  05/23/2012    Procedure: NECK MASS BIOPSY;  Surgeon: Rozetta Nunnery, MD;  Location: [place];  Service: ENT;  Laterality: N/A;  . Radical neck dissection  05/23/2012    Procedure: RADICAL NECK DISSECTION;  Surgeon: Leonides Sake  Lucia Gaskins, MD;  Location: [place];  Service: ENT;  Laterality: Left;  . Direct laryngoscopy N/A 03/23/2013    Procedure: DIRECT LARYNGOSCOPY;  Surgeon: Rozetta Nunnery, MD;  Location: [place];  Service: ENT;  Laterality: N/A;  . Esophageal dilation N/A 03/23/2013    Procedure: ESOPHAGEAL DILATION;   Surgeon: Rozetta Nunnery, MD;  Location: [place];  Service: ENT;  Laterality: N/A;    Medications: Prior to Admission medications   Medication Sig Start Date End Date Taking? Authorizing Provider  Alum & Mag Hydroxide-Simeth (MAGIC MOUTHWASH W/LIDOCAINE) SOLN Take 5 mLs by mouth every 6 (six) hours as needed (Swish and Spit). 02/22/14  Yes Heath Lark, MD  chlorhexidine (PERIDEX) 0.12 % solution Use as directed 15 mLs in the mouth or throat 3 (three) times daily. Use for 30 seconds each time.   Yes Historical Provider, MD  Cholecalciferol (VITAMIN D PO) Take 1 tablet by mouth daily.   Yes Historical Provider, MD  doxylamine, Sleep, (UNISOM) 25 MG tablet Take 50 mg by mouth at bedtime as needed for sleep.   Yes Historical Provider, MD  fentaNYL (DURAGESIC - DOSED MCG/HR) 50 MCG/HR Place 1 patch onto the skin every 3 (three) days.   Yes Historical Provider, MD  fluconazole (DIFLUCAN) 100 MG tablet Take 100 mg by mouth daily. 11/02/13  Yes Heath Lark, MD  HYDROmorphone (DILAUDID) 2 MG tablet Take 2 mg by mouth every 4 (four) hours as needed for severe pain.   Yes Historical Provider, MD  levothyroxine (SYNTHROID, LEVOTHROID) 50 MCG tablet TAKE 1 TABLET BY MOUTH DAILY BEFORE BREAKFAST 02/01/14  Yes Heath Lark, MD  Lidocaine HCl 2 % SOLN Swish and spit 5 mLs every 6 (six) hours as needed (mouth sores). 08/27/13  Yes Heath Lark, MD  lipase/protease/amylase (CREON-12/PANCREASE) 12000 UNITS CPEP capsule Take 1 capsule by mouth 4 (four) times daily.   Yes Historical Provider, MD  metoprolol (LOPRESSOR) 50 MG tablet Take 50 mg by mouth 2 (two) times daily.   Yes Historical Provider, MD  Nutritional Supplements (ENSURE PO) Take 1 Can by mouth 3 (three) times daily.   Yes Historical Provider, MD  Nutritional Supplements (FEEDING SUPPLEMENT, OSMOLITE 1.5 CAL,) LIQD Take 237 mLs by mouth daily. Drinks 4 cans per day   Yes Historical Provider, MD  Omega-3 Fatty Acids (FISH OIL PO) Take 1  capsule by mouth daily.   Yes Historical Provider, MD  omeprazole (PRILOSEC) 40 MG capsule Take 40 mg by mouth daily.    Yes Historical Provider, MD  oxyCODONE (ROXICODONE) 15 MG immediate release tablet Take 15 mg by mouth every 4 (four) hours.    Yes Historical Provider, MD  polyethylene glycol (MIRALAX) packet Take 17 g by mouth daily. 02/01/14  Yes Heath Lark, MD  promethazine (PHENERGAN) 25 MG tablet Take 1 tablet (25 mg total) by mouth every 6 (six) hours as needed. For nausea 08/28/12  Yes Maryanna Shape, NP  temazepam (RESTORIL) 30 MG capsule Take 30 mg by mouth at bedtime as needed for sleep.   Yes Historical Provider, MD    Allergies:  No Known Allergies  Social History:  reports that he has been smoking Cigarettes.  He has a 10 pack-year smoking history. He has never used smokeless tobacco. He reports that he uses illicit drugs (Marijuana). He reports that he does not drink alcohol.  Family History: Family History  Problem Relation Age of Onset  . COPD Mother     Physical Exam: Blood  pressure 155/82, pulse 57, temperature 97.5 F (36.4 C), temperature source Oral, resp. rate 18, SpO2 99.00%. General: Alert, awake, oriented x3, in no acute distress. HEENT: normocephalic, atraumatic, anicteric sclera, pink conjunctiva, pupils equal and reactive to light and accomodation, oropharynx clear Neck: supple, no masses or lymphadenopathy, no goiter, no bruits  Heart: Regular rate and rhythm, without murmurs, rubs or gallops. Lungs: Clear to auscultation bilaterally, no wheezing, rales or rhonchi. Abdomen: Epigastric tenderness to deep palpation, soft, and nondistended, positive bowel sounds, no masses. Extremities: No clubbing, cyanosis or edema with positive pedal pulses. Neuro: Grossly intact, no focal neurological deficits, strength 5/5 upper and lower extremities bilaterally Psych: alert and oriented x 3, normal mood and affect Skin: no rashes or lesions, warm and dry   LABS  on Admission:  Basic Metabolic Panel:  Recent Labs Lab 02/28/14 1006  NA 139  K 4.3  CL 97  CO2 28  GLUCOSE 106*  BUN 10  CREATININE 0.60  CALCIUM 10.0   Liver Function Tests:  Recent Labs Lab 02/28/14 1006  AST 24  ALT 12  ALKPHOS 78  BILITOT 0.4  PROT 8.3  ALBUMIN 4.4    Recent Labs Lab 02/28/14 1006  LIPASE 12   No results found for this basename: AMMONIA,  in the last 168 hours CBC:  Recent Labs Lab 02/28/14 1006  WBC 3.3*  NEUTROABS 1.9  HGB 13.8  HCT 40.5  MCV 99.8  PLT PLATELET CLUMPS NOTED ON SMEAR, COUNT APPEARS DECREASED   Cardiac Enzymes: No results found for this basename: CKTOTAL, CKMB, CKMBINDEX, TROPONINI,  in the last 168 hours BNP: No components found with this basename: POCBNP,  CBG: No results found for this basename: GLUCAP,  in the last 168 hours   Radiological Exams on Admission: Nm Bone Scan Whole Body  01/31/2014   CLINICAL DATA:  History of tonsillar carcinoma. Evaluate for metastatic disease. Sclerotic area in T4 and T6 on CT.  EXAM: NUCLEAR MEDICINE WHOLE BODY BONE SCAN  TECHNIQUE: Whole body anterior and posterior images were obtained approximately 3 hours after intravenous injection of radiopharmaceutical.  RADIOPHARMACEUTICALS:  25 mCi Technetium-99 MDP  COMPARISON:  CT neck and chest 01/25/2014 and CT chest 07/03/2010  FINDINGS: Expected activity is seen in the bilateral kidneys and in the urinary bladder. There are no suspicious areas of uptake in the skeleton to suggest bony metastatic disease. Specifically, no abnormal/increased uptake is seen in the thoracic spine vertebral bodies.  IMPRESSION: No evidence of bony metastatic disease.   Electronically Signed   By: Curlene Dolphin M.D.   On: 01/31/2014 15:22   Ct Abdomen Pelvis W Contrast  02/28/2014   CLINICAL DATA:  Progressive severe abdominal pain. Nausea. Chronic pancreatitis. Personal history of tonsillar carcinoma.  EXAM: CT ABDOMEN AND PELVIS WITH CONTRAST  TECHNIQUE:  Multidetector CT imaging of the abdomen and pelvis was performed using the standard protocol following bolus administration of intravenous contrast.  CONTRAST:  149mL OMNIPAQUE IOHEXOL 300 MG/ML  SOLN  COMPARISON:  01/23/2014  FINDINGS: Changes of chronic calcific pancreatitis are noted. No demonstrated is swelling involving the pancreatic head and uncinate process with peripancreatic soft tissue swelling, consistent with acute focal pancreatitis. No discrete pancreatic mass identified. No evidence of pancreatic pseudocyst or biliary ductal dilatation. Surgical clips seen from prior cholecystectomy.  Percutaneous gastrostomy tube remains in appropriate position. No evidence of bowel obstruction. The liver, spleen, adrenal glands, and kidneys are normal in appearance. No evidence of hydronephrosis. No soft tissue masses or lymphadenopathy  identified. No evidence of bowel wall thickening or dilatation.  IMPRESSION: Acute focal pancreatitis involving the pancreatic head and uncinate process, superimposed on chronic calcific pancreatitis.  No evidence of pancreatic pseudocyst or other complication.   Electronically Signed   By: Earle Gell M.D.   On: 02/28/2014 13:49    Assessment/Plan Principal Problem:  Abdominal pain secondary to Acute on chronic pancreatitis -Patient will be admitted to [facility] on observation, placed on aggressive IV fluid hydration, pain control, n.p.o. Today - Advance diet in a.m. to clears if pain is improving  Active Problems:   HTN (hypertension) - Currently stable, continue metoprolol    Hypothyroidism (acquired) - Will check TSH, continue Synthroid    Nicotine abuse: Patient was counseled strongly on quitting smoking, placed on nicotine patch   Chronic Pancytopenia -Will monitor counts, his oncologist aware  History of tonsillar cancer  - Recent PET scan in April 2015 was negative for any metastasis, follows Dr. Alvy Bimler  DVT prophylaxis: SCDs  CODE STATUS: Full  code  Family Communication: Admission, patients condition and plan of care including tests being ordered have been discussed with the patient who indicates understanding and agree with the plan and Code Status   Further plan will depend as patient's clinical course evolves and further radiologic and laboratory data become available.   Time Spent on Admission: 1 hour   Bryla Burek Krystal Eaton M.D. [facility] 02/28/2014, 3:34 PM Pager: [phone]  If 7PM-7AM, please contact night-coverage [email or web address] Password [email or web address]  **Disclaimer: This note was dictated with voice recognition software. Similar sounding words can inadvertently be transcribed and this note may contain transcription errors which may not have been corrected upon publication of note.**

## 2014-02-28 NOTE — ED Notes (Signed)
Pt was here about a month ago and was dx w/ pancreatitis.  C/o continued pain.  Denies vomiting or diarrhea.

## 2014-02-28 NOTE — ED Provider Notes (Signed)
Medical screening examination/treatment/procedure(s) were conducted as a shared visit with non-physician practitioner(s) and myself.  I personally evaluated the patient during the encounter.   EKG Interpretation None       See prior note   Rolland Porter, MD, Alanson Aly, MD 02/28/14 [phone]

## 2014-03-01 DIAGNOSIS — R599 Enlarged lymph nodes, unspecified: Secondary | ICD-10-CM

## 2014-03-01 DIAGNOSIS — D61818 Other pancytopenia: Secondary | ICD-10-CM

## 2014-03-01 DIAGNOSIS — F17200 Nicotine dependence, unspecified, uncomplicated: Secondary | ICD-10-CM

## 2014-03-01 LAB — CBC
HCT: 34.4 % — ABNORMAL LOW (ref 39.0–52.0)
Hemoglobin: 11.5 g/dL — ABNORMAL LOW (ref 13.0–17.0)
MCH: 33.8 pg (ref 26.0–34.0)
MCHC: 33.4 g/dL (ref 30.0–36.0)
MCV: 101.2 fL — AB (ref 78.0–100.0)
PLATELETS: 104 10*3/uL — AB (ref 150–400)
RBC: 3.4 MIL/uL — AB (ref 4.22–5.81)
RDW: 13.5 % (ref 11.5–15.5)
WBC: 2.2 10*3/uL — AB (ref 4.0–10.5)

## 2014-03-01 LAB — COMPREHENSIVE METABOLIC PANEL
ALBUMIN: 3.5 g/dL (ref 3.5–5.2)
ALT: 10 U/L (ref 0–53)
AST: 19 U/L (ref 0–37)
Alkaline Phosphatase: 63 U/L (ref 39–117)
BILIRUBIN TOTAL: 0.4 mg/dL (ref 0.3–1.2)
BUN: 8 mg/dL (ref 6–23)
CHLORIDE: 106 meq/L (ref 96–112)
CO2: 26 meq/L (ref 19–32)
Calcium: 8.5 mg/dL (ref 8.4–10.5)
Creatinine, Ser: 0.64 mg/dL (ref 0.50–1.35)
GFR calc Af Amer: >90 mL/min (ref >90)
Glucose, Bld: 77 mg/dL (ref 70–99)
Potassium: 4.2 mEq/L (ref 3.7–5.3)
Sodium: 144 mEq/L (ref 137–147)
Total Protein: 6.5 g/dL (ref 6.0–8.3)

## 2014-03-01 MED ORDER — HYDROMORPHONE HCL PF 2 MG/ML IJ SOLN
2.0000 mg | INTRAMUSCULAR | Status: DC | PRN
  Administered 2014-03-01 – 2014-03-05 (×18): 2 mg via INTRAVENOUS
  Filled 2014-03-01 (×18): qty 1

## 2014-03-01 NOTE — Progress Notes (Signed)
[facility] PROGRESS NOTE   Seung Nidiffer WPY:[id number] DOB: [birth year] DOA: 02/28/2014 PCP: Harvie Junior, MD  HPI/Subjective: Abdominal pain is 3-4/10, denies any radiation.  Assessment/Plan: Principal Problem:   Acute pancreatitis Active Problems:   HTN (hypertension)   Hypothyroidism (acquired)   Nicotine abuse   Pancytopenia    Acute on chronic pancreatitis  -Patient is n.p.o. secondary to previous tonsillar cancer. -Patient gets his diet through PEG tube, continue "n.p.o.", And feeding status. -Only allow flushes to the PEG tube, continue IV fluids and pain control.  HTN (hypertension)  - Currently stable, continue metoprolol   Hypothyroidism (acquired)  - TSH is normal  Nicotine abuse:  - Patient was counseled strongly on quitting smoking, placed on nicotine patch   Chronic Pancytopenia  -Will monitor counts intermittently.   History of tonsillar cancer  - Recent PET scan in April 2015 was negative for any metastasis, follows Dr. Alvy Bimler  Code Status: Full code Family Communication: Plan discussed with the patient. Disposition Plan: Remains inpatient   Consultants:  None  Procedures:  None  Antibiotics:  None   Objective: Filed Vitals:   03/01/14 0614  BP: 112/68  Pulse: 62  Temp: 98.2 F (36.8 C)  Resp: 20    Intake/Output Summary (Last 24 hours) at 03/01/14 1240 Last data filed at 03/01/14 1113  Gross per 24 hour  Intake 1974.08 ml  Output    600 ml  Net 1374.08 ml   Filed Weights   02/28/14 1547  Weight: 56.7 kg (125 lb)    Exam: General: Alert and awake, oriented x3, not in any acute distress. HEENT: anicteric sclera, pupils reactive to light and accommodation, EOMI CVS: S1-S2 clear, no murmur rubs or gallops Chest: clear to auscultation bilaterally, no wheezing, rales or rhonchi Abdomen: soft, PEG tube in place,nontender, nondistended, normal bowel sounds, no organomegaly Extremities: no cyanosis,  clubbing or edema noted bilaterally Neuro: Cranial nerves II-XII intact, no focal neurological deficits  Data Reviewed: Basic Metabolic Panel:  Recent Labs Lab 02/28/14 1006 03/01/14 0350  NA 139 144  K 4.3 4.2  CL 97 106  CO2 28 26  GLUCOSE 106* 77  BUN 10 8  CREATININE 0.60 0.64  CALCIUM 10.0 8.5   Liver Function Tests:  Recent Labs Lab 02/28/14 1006 03/01/14 0350  AST 24 19  ALT 12 10  ALKPHOS 78 63  BILITOT 0.4 0.4  PROT 8.3 6.5  ALBUMIN 4.4 3.5    Recent Labs Lab 02/28/14 1006  LIPASE 12   No results found for this basename: AMMONIA,  in the last 168 hours CBC:  Recent Labs Lab 02/28/14 1006 03/01/14 0350  WBC 3.3* 2.2*  NEUTROABS 1.9  --   HGB 13.8 11.5*  HCT 40.5 34.4*  MCV 99.8 101.2*  PLT PLATELET CLUMPS NOTED ON SMEAR, COUNT APPEARS DECREASED 104*   Cardiac Enzymes: No results found for this basename: CKTOTAL, CKMB, CKMBINDEX, TROPONINI,  in the last 168 hours BNP (last 3 results) No results found for this basename: PROBNP,  in the last 8760 hours CBG: No results found for this basename: GLUCAP,  in the last 168 hours  Micro Recent Results (from the past 240 hour(s))  MRSA PCR SCREENING     Status: Abnormal   Collection Time    02/28/14  4:19 PM      Result Value Ref Range Status   MRSA by PCR POSITIVE (*) NEGATIVE Final   Comment: RESULT CALLED TO, READ BACK BY AND VERIFIED WITH:  P.MOSTELLER,RN AT 2145 ON 02/28/14 BY WSHEA.                The GeneXpert MRSA Assay (FDA     approved for NASAL specimens     only), is one component of a     comprehensive MRSA colonization     surveillance program. It is not     intended to diagnose MRSA     infection nor to guide or     monitor treatment for     MRSA infections.     Studies: Ct Abdomen Pelvis W Contrast  02/28/2014   CLINICAL DATA:  Progressive severe abdominal pain. Nausea. Chronic pancreatitis. Personal history of tonsillar carcinoma.  EXAM: CT ABDOMEN AND PELVIS WITH  CONTRAST  TECHNIQUE: Multidetector CT imaging of the abdomen and pelvis was performed using the standard protocol following bolus administration of intravenous contrast.  CONTRAST:  157mL OMNIPAQUE IOHEXOL 300 MG/ML  SOLN  COMPARISON:  01/23/2014  FINDINGS: Changes of chronic calcific pancreatitis are noted. No demonstrated is swelling involving the pancreatic head and uncinate process with peripancreatic soft tissue swelling, consistent with acute focal pancreatitis. No discrete pancreatic mass identified. No evidence of pancreatic pseudocyst or biliary ductal dilatation. Surgical clips seen from prior cholecystectomy.  Percutaneous gastrostomy tube remains in appropriate position. No evidence of bowel obstruction. The liver, spleen, adrenal glands, and kidneys are normal in appearance. No evidence of hydronephrosis. No soft tissue masses or lymphadenopathy identified. No evidence of bowel wall thickening or dilatation.  IMPRESSION: Acute focal pancreatitis involving the pancreatic head and uncinate process, superimposed on chronic calcific pancreatitis.  No evidence of pancreatic pseudocyst or other complication.   Electronically Signed   By: Earle Gell M.D.   On: 02/28/2014 13:49    Scheduled Meds: . fentaNYL  50 mcg Transdermal Q72H  . levothyroxine  50 mcg Per Tube QAC breakfast  . lipase/protease/amylase  1 capsule Oral TID AC & HS  . metoprolol  50 mg Per Tube BID  . nicotine  21 mg Transdermal Daily  . oxyCODONE  15 mg Oral Q4H  . pantoprazole sodium  80 mg Oral Daily  . polyethylene glycol  17 g Oral Daily   Continuous Infusions: . sodium chloride 125 mL/hr at 03/01/14 0501       Time spent: 35 minutes    Verlee Monte  [facility] Pager [phone] If 7PM-7AM, please contact night-coverage at [email or web address], password [email or web address] 03/01/2014, 12:40 PM  LOS: 1 day

## 2014-03-01 NOTE — Progress Notes (Signed)
UR completed. Patient changed to inpatient- requiring IVF @ 125cc/hr -NPO

## 2014-03-01 NOTE — Progress Notes (Signed)
Agree with dietetic intern's note and recommendations.   Yareth Macdonnell Baron MS, RD, LDN [phone] Pager [phone] After Hours Pager  

## 2014-03-01 NOTE — Progress Notes (Signed)
INITIAL NUTRITION ASSESSMENT  DOCUMENTATION CODES Per approved criteria  -Not Applicable   INTERVENTION:  Diet advancement per MD  Recommend trickle feeds over weekend of Osmolite 1.5 if unable to feed  Resume Home TF regimen as soon as abdominal pain subsides  TF provides low fat diet: Osmolite 1.5, 2 cans TID, providing 2130 kcal (100% of minimum estimated needs), 89 grams of protein (100% of estimated needs) and 1086 ml free water  Recommend free water flushes of 150 ml 6 times a day  RD to continue to follow nutrition care plan   NUTRITION DIAGNOSIS: Inadequate oral intake related to inability to eat as evidenced by NPO status.   Goal: Pt to meet >/=90% of estimated nutrition needs   Monitor:  Diet advancement, TF initiation/tolerance, weight trend, labs   Reason for Assessment: Positive Malnutrition Screening Tool Score   58 y.o. male  Admitting Dx: Acute pancreatitis  ASSESSMENT: Patient is a 58 year old male with history of chronic pancreatitis, tonsillar cancer, hypertension presented to the ER with epigastric abdominal pain for last 2 weeks. Patient reported that it is similar to his prior episodes of pancreatitis, hence he had been eating a liquid diet and drinking lot of fluids at home.  - Pt reports his abdominal pain is still the same - Pt reports using 6 cans of Osmolite 1.5 a day at home (2 cans TID) - Pt reports a weight loss of 3 lbs this week. Per chart review, pt has recently gained weight  - Pt reports only drinking water/juice PO at home, all nutrition comes from PEG  - Recommend resuming home TF when patient's abdominal pain subsides   Lipase     Component Value Date/Time   LIPASE 12 02/28/2014 1006   Amylase    Component Value Date/Time   AMYLASE 13 01/18/2014 1253    Nutrition Focused Physical Exam:  Subcutaneous Fat:  Orbital Region: wnl Upper Arm Region: mild depletion  Thoracic and Lumbar Region: wnl  Muscle:  Temple Region:  wnl Clavicle Bone Region: wnl Clavicle and Acromion Bone Region: wnl Scapular Bone Region: wnl Dorsal Hand: wnl Patellar Region: mild depletion  Anterior Thigh Region: mild depeltion Posterior Calf Region: wnl  Edema: none     Height: Ht Readings from Last 1 Encounters:  02/28/14 5\' 8"  (1.727 m)    Weight: Wt Readings from Last 1 Encounters:  02/28/14 125 lb (56.7 kg)    Ideal Body Weight: 154 lbs   % Ideal Body Weight: 81%   Wt Readings from Last 10 Encounters:  02/28/14 125 lb (56.7 kg)  02/01/14 122 lb 3.2 oz (55.43 kg)  01/18/14 128 lb 3.2 oz (58.151 kg)  11/02/13 130 lb 3.2 oz (59.058 kg)  04/19/13 128 lb 12.8 oz (58.423 kg)  03/23/13 127 lb 8 oz (57.834 kg)  03/23/13 127 lb 8 oz (57.834 kg)  03/01/13 126 lb 14.4 oz (57.561 kg)  09/21/12 123 lb 3.2 oz (55.883 kg)  09/08/12 124 lb 3.2 oz (56.337 kg)    Usual Body Weight: 128 lbs   % Usual Body Weight: 98%   BMI:  Body mass index is 19.01 kg/(m^2)., Normal   Estimated Nutritional Needs: Kcal: 1900 - 2100 Protein: 85 - 95 grams  Fluid: >/=1.9 L/day   Skin: WDL  Diet Order: NPO  EDUCATION NEEDS: -No education needs identified at this time   Intake/Output Summary (Last 24 hours) at 03/01/14 0947 Last data filed at 03/01/14 0501  Gross per 24 hour  Intake 1974.08 ml  Output    400 ml  Net 1574.08 ml    Last BM: 5/26    Labs:   Recent Labs Lab 02/28/14 1006 03/01/14 0350  NA 139 144  K 4.3 4.2  CL 97 106  CO2 28 26  BUN 10 8  CREATININE 0.60 0.64  CALCIUM 10.0 8.5  GLUCOSE 106* 77    CBG (last 3)  No results found for this basename: GLUCAP,  in the last 72 hours  Scheduled Meds: . feeding supplement (OSMOLITE 1.5 CAL)  237 mL Oral Daily  . fentaNYL  50 mcg Transdermal Q72H  . levothyroxine  50 mcg Per Tube QAC breakfast  . lipase/protease/amylase  1 capsule Oral TID AC & HS  . metoprolol  50 mg Per Tube BID  . nicotine  21 mg Transdermal Daily  . oxyCODONE  15 mg Oral Q4H   . pantoprazole sodium  80 mg Oral Daily  . polyethylene glycol  17 g Oral Daily    Continuous Infusions: . sodium chloride 125 mL/hr at 03/01/14 0501    Past Medical History  Diagnosis Date  . Hypertension   . Chronic pancreatitis   . Arthritis   . Hx of radiation therapy 02/02/11 to 03/22/11    L tonsil  . Seizures     alcohol-related  . Cervical adenopathy 04/26/2012  . Urinary hesitancy   . Constipation   . Shortness of breath 05/23/2012    "lying down"  . History of blood transfusion ~ 2004    "from the pancreatitis"  . Daily headache 05/23/2012    "small ones"  . History of radiation therapy 02/02/2011-03/22/2011    head/neck,left tonsil  . G tube feedings     has a feeding tube in stomach  . Hypothyroidism 10/29/2013  . Thrush 11/02/2013  . Cancer 12/15/10    left tonsillar squamous cell carcinoma  . Tonsillar cancer     tonsillar ca dx 12/27/10  . 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 Bile duct stent placement      hx of  . Radical neck dissection  05/23/2012    w/mass excision  . Cholecystectomy  04/2005  . Tibia fracture surgery  1990's    left  . Fracture surgery    . Direct laryngoscopy  05/23/2012    Procedure: DIRECT LARYNGOSCOPY;  Surgeon: Rozetta Nunnery, MD;  Location: [place];  Service: ENT;  Laterality: N/A;  . Mass biopsy  05/23/2012    Procedure: NECK MASS BIOPSY;  Surgeon: Rozetta Nunnery, MD;  Location: [place];  Service: ENT;  Laterality: N/A;  . Radical neck dissection  05/23/2012    Procedure: RADICAL NECK DISSECTION;  Surgeon: Rozetta Nunnery, MD;  Location: [place];  Service: ENT;  Laterality: Left;  . Direct laryngoscopy N/A 03/23/2013    Procedure: DIRECT LARYNGOSCOPY;  Surgeon: Rozetta Nunnery, MD;  Location: [place];  Service: ENT;  Laterality: N/A;  . Esophageal dilation N/A 03/23/2013    Procedure: ESOPHAGEAL DILATION;  Surgeon: Rozetta Nunnery, MD;  Location: [place];  Service: ENT;  Laterality: N/A;    Carrolyn Leigh, BS Dietetic Intern Pager: [phone]

## 2014-03-02 DIAGNOSIS — E039 Hypothyroidism, unspecified: Secondary | ICD-10-CM

## 2014-03-02 DIAGNOSIS — C099 Malignant neoplasm of tonsil, unspecified: Secondary | ICD-10-CM

## 2014-03-02 DIAGNOSIS — Z923 Personal history of irradiation: Secondary | ICD-10-CM

## 2014-03-02 MED ORDER — OSMOLITE 1.5 CAL PO LIQD
237.0000 mL | Freq: Three times a day (TID) | ORAL | Status: DC
  Administered 2014-03-02: 237 mL
  Filled 2014-03-02 (×2): qty 237

## 2014-03-02 MED ORDER — OSMOLITE 1.5 CAL PO LIQD: 237.0000 mL | Freq: Every day | ORAL | Status: DC

## 2014-03-02 NOTE — Progress Notes (Signed)
[facility] PROGRESS NOTE   Russell Williamson IDP:[id number] DOB: [birth year] DOA: 02/28/2014 PCP: Harvie Junior, MD  HPI/Subjective: Abdominal pain is better, controlled with pain medications. It's hard to differentiate between has chronic pain from cancer in the acute pain from the pancreatitis.  Assessment/Plan: Principal Problem:   Acute pancreatitis Active Problems:   HTN (hypertension)   Hypothyroidism (acquired)   Nicotine abuse   Pancytopenia    Acute on chronic pancreatitis  -Patient is n.p.o. secondary to previous tonsillar cancer. -Patient gets his diet through PEG tube, restart tube feeding today. -Continue IV fluids and pain control. -And we started to feed him, watch for nausea, vomiting and abdominal pain after meals.  HTN (hypertension)  - Currently stable, continue metoprolol   Hypothyroidism (acquired)  - TSH is normal  Nicotine abuse:  - Patient was counseled strongly on quitting smoking, placed on nicotine patch   Chronic Pancytopenia  -Will monitor counts intermittently.   History of tonsillar cancer  - Recent PET scan in April 2015 was negative for any metastasis, follows Dr. Alvy Bimler  Code Status: Full code Family Communication: Plan discussed with the patient. Disposition Plan: Remains inpatient   Consultants:  None  Procedures:  None  Antibiotics:  None   Objective: Filed Vitals:   03/02/14 0532  BP: 133/87  Pulse: 65  Temp: 98.3 F (36.8 C)  Resp: 17    Intake/Output Summary (Last 24 hours) at 03/02/14 1150 Last data filed at 03/02/14 0850  Gross per 24 hour  Intake 1742.92 ml  Output   2000 ml  Net -257.08 ml   Filed Weights   02/28/14 1547  Weight: 56.7 kg (125 lb)    Exam: General: Alert and awake, oriented x3, not in any acute distress. HEENT: anicteric sclera, pupils reactive to light and accommodation, EOMI CVS: S1-S2 clear, no murmur rubs or gallops Chest: clear to auscultation bilaterally,  no wheezing, rales or rhonchi Abdomen: soft, PEG tube in place,nontender, nondistended, normal bowel sounds, no organomegaly Extremities: no cyanosis, clubbing or edema noted bilaterally Neuro: Cranial nerves II-XII intact, no focal neurological deficits  Data Reviewed: Basic Metabolic Panel:  Recent Labs Lab 02/28/14 1006 03/01/14 0350  NA 139 144  K 4.3 4.2  CL 97 106  CO2 28 26  GLUCOSE 106* 77  BUN 10 8  CREATININE 0.60 0.64  CALCIUM 10.0 8.5   Liver Function Tests:  Recent Labs Lab 02/28/14 1006 03/01/14 0350  AST 24 19  ALT 12 10  ALKPHOS 78 63  BILITOT 0.4 0.4  PROT 8.3 6.5  ALBUMIN 4.4 3.5    Recent Labs Lab 02/28/14 1006  LIPASE 12   No results found for this basename: AMMONIA,  in the last 168 hours CBC:  Recent Labs Lab 02/28/14 1006 03/01/14 0350  WBC 3.3* 2.2*  NEUTROABS 1.9  --   HGB 13.8 11.5*  HCT 40.5 34.4*  MCV 99.8 101.2*  PLT PLATELET CLUMPS NOTED ON SMEAR, COUNT APPEARS DECREASED 104*   Cardiac Enzymes: No results found for this basename: CKTOTAL, CKMB, CKMBINDEX, TROPONINI,  in the last 168 hours BNP (last 3 results) No results found for this basename: PROBNP,  in the last 8760 hours CBG: No results found for this basename: GLUCAP,  in the last 168 hours  Micro Recent Results (from the past 240 hour(s))  MRSA PCR SCREENING     Status: Abnormal   Collection Time    02/28/14  4:19 PM      Result Value Ref Range Status  MRSA by PCR POSITIVE (*) NEGATIVE Final   Comment: RESULT CALLED TO, READ BACK BY AND VERIFIED WITH:     P.MOSTELLER,RN AT 2145 ON 02/28/14 BY WSHEA.                The GeneXpert MRSA Assay (FDA     approved for NASAL specimens     only), is one component of a     comprehensive MRSA colonization     surveillance program. It is not     intended to diagnose MRSA     infection nor to guide or     monitor treatment for     MRSA infections.     Studies: Ct Abdomen Pelvis W Contrast  02/28/2014   CLINICAL  DATA:  Progressive severe abdominal pain. Nausea. Chronic pancreatitis. Personal history of tonsillar carcinoma.  EXAM: CT ABDOMEN AND PELVIS WITH CONTRAST  TECHNIQUE: Multidetector CT imaging of the abdomen and pelvis was performed using the standard protocol following bolus administration of intravenous contrast.  CONTRAST:  163mL OMNIPAQUE IOHEXOL 300 MG/ML  SOLN  COMPARISON:  01/23/2014  FINDINGS: Changes of chronic calcific pancreatitis are noted. No demonstrated is swelling involving the pancreatic head and uncinate process with peripancreatic soft tissue swelling, consistent with acute focal pancreatitis. No discrete pancreatic mass identified. No evidence of pancreatic pseudocyst or biliary ductal dilatation. Surgical clips seen from prior cholecystectomy.  Percutaneous gastrostomy tube remains in appropriate position. No evidence of bowel obstruction. The liver, spleen, adrenal glands, and kidneys are normal in appearance. No evidence of hydronephrosis. No soft tissue masses or lymphadenopathy identified. No evidence of bowel wall thickening or dilatation.  IMPRESSION: Acute focal pancreatitis involving the pancreatic head and uncinate process, superimposed on chronic calcific pancreatitis.  No evidence of pancreatic pseudocyst or other complication.   Electronically Signed   By: Earle Gell M.D.   On: 02/28/2014 13:49    Scheduled Meds: . fentaNYL  50 mcg Transdermal Q72H  . levothyroxine  50 mcg Per Tube QAC breakfast  . lipase/protease/amylase  1 capsule Oral TID AC & HS  . metoprolol  50 mg Per Tube BID  . nicotine  21 mg Transdermal Daily  . oxyCODONE  15 mg Oral Q4H  . pantoprazole sodium  80 mg Oral Daily  . polyethylene glycol  17 g Oral Daily   Continuous Infusions: . sodium chloride 125 mL/hr at 03/01/14 1305       Time spent: 35 minutes    Verlee Monte  [facility] Pager [phone] If 7PM-7AM, please contact night-coverage at [email or web address], password [facility] 03/02/2014,  11:50 AM  LOS: 2 days

## 2014-03-03 LAB — BASIC METABOLIC PANEL
BUN: 6 mg/dL (ref 6–23)
CO2: 26 meq/L (ref 19–32)
Calcium: 8.2 mg/dL — ABNORMAL LOW (ref 8.4–10.5)
Chloride: 105 mEq/L (ref 96–112)
Creatinine, Ser: 0.59 mg/dL (ref 0.50–1.35)
GFR calc Af Amer: >90 mL/min (ref >90)
GFR calc non Af Amer: >90 mL/min (ref >90)
GLUCOSE: 73 mg/dL (ref 70–99)
Potassium: 4 mEq/L (ref 3.7–5.3)
SODIUM: 141 meq/L (ref 137–147)

## 2014-03-03 LAB — CBC
HCT: 36.7 % — ABNORMAL LOW (ref 39.0–52.0)
HEMOGLOBIN: 12 g/dL — AB (ref 13.0–17.0)
MCH: 33.6 pg (ref 26.0–34.0)
MCHC: 32.7 g/dL (ref 30.0–36.0)
MCV: 102.8 fL — AB (ref 78.0–100.0)
Platelets: 98 10*3/uL — ABNORMAL LOW (ref 150–400)
RBC: 3.57 MIL/uL — ABNORMAL LOW (ref 4.22–5.81)
RDW: 13.1 % (ref 11.5–15.5)
WBC: 2.8 10*3/uL — AB (ref 4.0–10.5)

## 2014-03-03 MED ORDER — OSMOLITE 1.2 CAL PO LIQD
237.0000 mL | Freq: Three times a day (TID) | ORAL | Status: DC
  Administered 2014-03-03 – 2014-03-04 (×4): 237 mL
  Filled 2014-03-03 (×6): qty 237

## 2014-03-03 NOTE — Progress Notes (Signed)
[facility] PROGRESS NOTE   Russell Williamson OEU:[id number] DOB: [birth year] DOA: 02/28/2014 PCP: Harvie Junior, MD  HPI/Subjective: Abdominal pain is better, wants to try has to feeding again today. Restart to feeding, his oxygen saturation was 78% this morning. Mention that he had a lot of sputum that was suctioned out, please note that patient has history of tonsillar cancer.  Assessment/Plan: Principal Problem:   Acute pancreatitis Active Problems:   HTN (hypertension)   Hypothyroidism (acquired)   Nicotine abuse   Pancytopenia    Acute on chronic pancreatitis  -Patient is n.p.o. secondary to previous tonsillar cancer. -Patient gets his diet through PEG tube, restart tube feeding today. -Continue IV fluids and pain control. -And we started to feed him, watch for nausea, vomiting and abdominal pain after meals.  HTN (hypertension)  - Currently stable, continue metoprolol   Hypothyroidism (acquired)  - TSH is normal  Nicotine abuse:  - Patient was counseled strongly on quitting smoking, placed on nicotine patch   Chronic Pancytopenia  -Will monitor counts intermittently.   History of tonsillar cancer  - Recent PET scan in April 2015 was negative for any metastasis, follows Dr. Alvy Bimler  Code Status: Full code Family Communication: Plan discussed with the patient. Disposition Plan: Remains inpatient   Consultants:  None  Procedures:  None  Antibiotics:  None   Objective: Filed Vitals:   03/03/14 0741  BP: 160/87  Pulse: 58  Temp: 98 F (36.7 C)  Resp:     Intake/Output Summary (Last 24 hours) at 03/03/14 1110 Last data filed at 03/03/14 0928  Gross per 24 hour  Intake    420 ml  Output    850 ml  Net   -430 ml   Filed Weights   02/28/14 1547  Weight: 56.7 kg (125 lb)    Exam: General: Alert and awake, oriented x3, not in any acute distress. HEENT: anicteric sclera, pupils reactive to light and accommodation, EOMI CVS:  S1-S2 clear, no murmur rubs or gallops Chest: clear to auscultation bilaterally, no wheezing, rales or rhonchi Abdomen: soft, PEG tube in place,nontender, nondistended, normal bowel sounds, no organomegaly Extremities: no cyanosis, clubbing or edema noted bilaterally Neuro: Cranial nerves II-XII intact, no focal neurological deficits  Data Reviewed: Basic Metabolic Panel:  Recent Labs Lab 02/28/14 1006 03/01/14 0350 03/03/14 0602  NA 139 144 141  K 4.3 4.2 4.0  CL 97 106 105  CO2 28 26 26   GLUCOSE 106* 77 73  BUN 10 8 6   CREATININE 0.60 0.64 0.59  CALCIUM 10.0 8.5 8.2*   Liver Function Tests:  Recent Labs Lab 02/28/14 1006 03/01/14 0350  AST 24 19  ALT 12 10  ALKPHOS 78 63  BILITOT 0.4 0.4  PROT 8.3 6.5  ALBUMIN 4.4 3.5    Recent Labs Lab 02/28/14 1006  LIPASE 12   No results found for this basename: AMMONIA,  in the last 168 hours CBC:  Recent Labs Lab 02/28/14 1006 03/01/14 0350 03/03/14 0602  WBC 3.3* 2.2* 2.8*  NEUTROABS 1.9  --   --   HGB 13.8 11.5* 12.0*  HCT 40.5 34.4* 36.7*  MCV 99.8 101.2* 102.8*  PLT PLATELET CLUMPS NOTED ON SMEAR, COUNT APPEARS DECREASED 104* 98*   Cardiac Enzymes: No results found for this basename: CKTOTAL, CKMB, CKMBINDEX, TROPONINI,  in the last 168 hours BNP (last 3 results) No results found for this basename: PROBNP,  in the last 8760 hours CBG: No results found for this basename: GLUCAP,  in  the last 168 hours  Micro Recent Results (from the past 240 hour(s))  MRSA PCR SCREENING     Status: Abnormal   Collection Time    02/28/14  4:19 PM      Result Value Ref Range Status   MRSA by PCR POSITIVE (*) NEGATIVE Final   Comment: RESULT CALLED TO, READ BACK BY AND VERIFIED WITH:     P.MOSTELLER,RN AT 2145 ON 02/28/14 BY WSHEA.                The GeneXpert MRSA Assay (FDA     approved for NASAL specimens     only), is one component of a     comprehensive MRSA colonization     surveillance program. It is not      intended to diagnose MRSA     infection nor to guide or     monitor treatment for     MRSA infections.     Studies: No results found.  Scheduled Meds: . feeding supplement (OSMOLITE 1.2 CAL)  237 mL Per Tube TID BM  . fentaNYL  50 mcg Transdermal Q72H  . levothyroxine  50 mcg Per Tube QAC breakfast  . lipase/protease/amylase  1 capsule Oral TID AC & HS  . metoprolol  50 mg Per Tube BID  . nicotine  21 mg Transdermal Daily  . oxyCODONE  15 mg Oral Q4H  . pantoprazole sodium  80 mg Oral Daily  . polyethylene glycol  17 g Oral Daily   Continuous Infusions: . sodium chloride 75 mL/hr at 03/03/14 0310       Time spent: 35 minutes    Verlee Monte  [facility] Pager [phone] If 7PM-7AM, please contact night-coverage at [email or web address], password [facility] 03/03/2014, 11:10 AM  LOS: 3 days

## 2014-03-03 NOTE — Progress Notes (Signed)
Notified by the nurse tech this AM that pt O2sat was 78%  On room air. Found pt alert, oriented, and calm with no distress, pt stated he felt a lump in his throat and was unable to cough it out, with the use of a suction and a hand held  yanker at bedside pt was able to remove the sputum and verbalize feeling much better. Pt O2sat at this time was 98% on 2L  oxygen and Kristine RN placed pt on continuous pulse ox monitoring, on coming nurse made aware of PT status. Pt verbalized feeling better,  Russell Williamson. made awared

## 2014-03-04 ENCOUNTER — Inpatient Hospital Stay (HOSPITAL_COMMUNITY): Payer: Medicaid Other

## 2014-03-04 LAB — COMPREHENSIVE METABOLIC PANEL
ALT: 11 U/L (ref 0–53)
AST: 21 U/L (ref 0–37)
Albumin: 3.4 g/dL — ABNORMAL LOW (ref 3.5–5.2)
Alkaline Phosphatase: 69 U/L (ref 39–117)
BILIRUBIN TOTAL: 0.3 mg/dL (ref 0.3–1.2)
BUN: 6 mg/dL (ref 6–23)
CALCIUM: 8.4 mg/dL (ref 8.4–10.5)
CHLORIDE: 104 meq/L (ref 96–112)
CO2: 29 mEq/L (ref 19–32)
Creatinine, Ser: 0.54 mg/dL (ref 0.50–1.35)
GLUCOSE: 86 mg/dL (ref 70–99)
Potassium: 3.9 mEq/L (ref 3.7–5.3)
Sodium: 142 mEq/L (ref 137–147)
Total Protein: 6.7 g/dL (ref 6.0–8.3)

## 2014-03-04 MED ORDER — FUROSEMIDE 10 MG/ML IJ SOLN
40.0000 mg | Freq: Once | INTRAMUSCULAR | Status: AC
  Administered 2014-03-04: 40 mg via INTRAVENOUS
  Filled 2014-03-04: qty 4

## 2014-03-04 MED ORDER — POTASSIUM CHLORIDE 20 MEQ/15ML (10%) PO LIQD
40.0000 meq | Freq: Once | ORAL | Status: AC
  Administered 2014-03-04: 40 meq
  Filled 2014-03-04: qty 30

## 2014-03-04 MED ORDER — OSMOLITE 1.2 CAL PO LIQD
474.0000 mL | Freq: Three times a day (TID) | ORAL | Status: DC
  Administered 2014-03-04 – 2014-03-05 (×3): 474 mL
  Filled 2014-03-04 (×5): qty 474

## 2014-03-04 NOTE — Progress Notes (Signed)
[facility] PROGRESS NOTE   Russell Williamson XLK:[id number] DOB: 09/18/1956 DOA: 02/28/2014 PCP: Harvie Junior, MD  HPI/Subjective: Abdominal pain is better, tolerated tube feeds OK. Now 3-4 L of oxygen, likely secondary to the IV fluids.  Assessment/Plan: Principal Problem:   Acute pancreatitis Active Problems:   HTN (hypertension)   Hypothyroidism (acquired)   Nicotine abuse   Pancytopenia    Acute on chronic pancreatitis  -Patient is n.p.o. secondary to previous tonsillar cancer. -Patient gets his diet through PEG tube, restart tube feeding today. -Continue IV fluids and pain control. -And we started to feed him, watch for nausea, vomiting and abdominal pain after meals.  Hypoxia -Likely secondary to fluid overload, takes x-ray done showed emphysema new small bilateral effusion.  HTN (hypertension)  - Currently stable, continue metoprolol   Hypothyroidism (acquired)  - TSH is normal  Nicotine abuse:  - Patient was counseled strongly on quitting smoking, placed on nicotine patch   Chronic Pancytopenia  -Will monitor counts intermittently.   History of tonsillar cancer  - Recent PET scan in April 2015 was negative for any metastasis, follows Dr. Alvy Bimler  Code Status: Full code Family Communication: Plan discussed with the patient. Disposition Plan: Remains inpatient   Consultants:  None  Procedures:  None  Antibiotics:  None   Objective: Filed Vitals:   03/04/14 0522  BP: 143/82  Pulse: 65  Temp: 98.1 F (36.7 C)  Resp: 20    Intake/Output Summary (Last 24 hours) at 03/04/14 1056 Last data filed at 03/04/14 0820  Gross per 24 hour  Intake    120 ml  Output    375 ml  Net   -255 ml   Filed Weights   02/28/14 1547  Weight: 56.7 kg (125 lb)    Exam: General: Alert and awake, oriented x3, not in any acute distress. HEENT: anicteric sclera, pupils reactive to light and accommodation, EOMI CVS: S1-S2 clear, no murmur rubs  or gallops Chest: clear to auscultation bilaterally, no wheezing, rales or rhonchi Abdomen: soft, PEG tube in place,nontender, nondistended, normal bowel sounds, no organomegaly Extremities: no cyanosis, clubbing or edema noted bilaterally Neuro: Cranial nerves II-XII intact, no focal neurological deficits  Data Reviewed: Basic Metabolic Panel:  Recent Labs Lab 02/28/14 1006 03/01/14 0350 03/03/14 0602 03/04/14 0519  NA 139 144 141 142  K 4.3 4.2 4.0 3.9  CL 97 106 105 104  CO2 28 26 26 29   GLUCOSE 106* 77 73 86  BUN 10 8 6 6   CREATININE 0.60 0.64 0.59 0.54  CALCIUM 10.0 8.5 8.2* 8.4   Liver Function Tests:  Recent Labs Lab 02/28/14 1006 03/01/14 0350 03/04/14 0519  AST 24 19 21   ALT 12 10 11   ALKPHOS 78 63 69  BILITOT 0.4 0.4 0.3  PROT 8.3 6.5 6.7  ALBUMIN 4.4 3.5 3.4*    Recent Labs Lab 02/28/14 1006  LIPASE 12   No results found for this basename: AMMONIA,  in the last 168 hours CBC:  Recent Labs Lab 02/28/14 1006 03/01/14 0350 03/03/14 0602  WBC 3.3* 2.2* 2.8*  NEUTROABS 1.9  --   --   HGB 13.8 11.5* 12.0*  HCT 40.5 34.4* 36.7*  MCV 99.8 101.2* 102.8*  PLT PLATELET CLUMPS NOTED ON SMEAR, COUNT APPEARS DECREASED 104* 98*   Cardiac Enzymes: No results found for this basename: CKTOTAL, CKMB, CKMBINDEX, TROPONINI,  in the last 168 hours BNP (last 3 results) No results found for this basename: PROBNP,  in the last 8760 hours  CBG: No results found for this basename: GLUCAP,  in the last 168 hours  Micro Recent Results (from the past 240 hour(s))  MRSA PCR SCREENING     Status: Abnormal   Collection Time    02/28/14  4:19 PM      Result Value Ref Range Status   MRSA by PCR POSITIVE (*) NEGATIVE Final   Comment: RESULT CALLED TO, READ BACK BY AND VERIFIED WITH:     P.MOSTELLER,RN AT 2145 ON 02/28/14 BY WSHEA.                The GeneXpert MRSA Assay (FDA     approved for NASAL specimens     only), is one component of a     comprehensive MRSA  colonization     surveillance program. It is not     intended to diagnose MRSA     infection nor to guide or     monitor treatment for     MRSA infections.     Studies: Dg Chest 2 View  03/04/2014   CLINICAL DATA:  Shortness of breath and hypertension.  EXAM: CHEST  2 VIEW  COMPARISON:  05/22/2012.  CT scan from 01/25/2014.  FINDINGS: Lungs are hyperexpanded with attenuation of peripheral markings, consistent with emphysema. Since the previous CT scan, the patient has developed small bilateral pleural effusions, right more so than left. There is some dependent probable atelectasis at the right base. The cardiopericardial silhouette is within normal limits for size. Posterior right sixth rib fracture appears to have associated callus suggesting a nonacute injury, but this is new since the previous CT.  IMPRESSION: Emphysema with new small bilateral pleural effusions and right base atelectasis.  Subacute posterior right sixth rib fracture.   Electronically Signed   By: Misty Stanley M.D.   On: 03/04/2014 08:46    Scheduled Meds: . feeding supplement (OSMOLITE 1.2 CAL)  237 mL Per Tube TID BM  . fentaNYL  50 mcg Transdermal Q72H  . furosemide  40 mg Intravenous Once  . levothyroxine  50 mcg Per Tube QAC breakfast  . lipase/protease/amylase  1 capsule Oral TID AC & HS  . metoprolol  50 mg Per Tube BID  . nicotine  21 mg Transdermal Daily  . oxyCODONE  15 mg Oral Q4H  . pantoprazole sodium  80 mg Oral Daily  . polyethylene glycol  17 g Oral Daily   Continuous Infusions:       Time spent: 35 minutes    Verlee Monte  [facility] Pager [phone] If 7PM-7AM, please contact night-coverage at [email or web address], password [facility] 03/04/2014, 10:56 AM  LOS: 4 days

## 2014-03-05 LAB — BASIC METABOLIC PANEL
BUN: 10 mg/dL (ref 6–23)
CALCIUM: 8.8 mg/dL (ref 8.4–10.5)
CHLORIDE: 99 meq/L (ref 96–112)
CO2: 32 mEq/L (ref 19–32)
Creatinine, Ser: 0.59 mg/dL (ref 0.50–1.35)
GFR calc non Af Amer: >90 mL/min (ref >90)
Glucose, Bld: 138 mg/dL — ABNORMAL HIGH (ref 70–99)
Potassium: 3.7 mEq/L (ref 3.7–5.3)
Sodium: 140 mEq/L (ref 137–147)

## 2014-03-05 NOTE — Discharge Summary (Signed)
Physician Discharge Summary  Conlan Clanin [id number] DOB: [birth year] DOA: 02/28/2014  PCP: Harvie Junior, MD  Admit date: 02/28/2014 Discharge date: 03/05/2014  Time spent: 40 minutes  Recommendations for Outpatient Follow-up:  1. Follow up with PCP in 1 week.  Discharge Diagnoses:  Principal Problem:   Acute pancreatitis Active Problems:   HTN (hypertension)   Hypothyroidism (acquired)   Nicotine abuse   Pancytopenia   Discharge Condition: Stable  Diet recommendation: N.p.o., patient gets to feeding: 2 cans of Osmolite 1.2 before every meal  Filed Weights   02/28/14 1547  Weight: 56.7 kg (125 lb)    History of present illness:  Patient is a 58 year old male with history of chronic pancreatitis, tonsillar cancer, hypertension presented to the ER with epigastric abdominal pain for last 2 weeks. History was obtained from the patient who reported that he has a history of chronic pancreatitis and is on chronic pain medications for that. He has been having epigastric abdominal pain with nausea, no vomiting, no radiation, no fevers or chills or any diarrhea. Patient reported that it is similar to his prior episodes of pancreatitis, hence he had been eating a liquid diet and drinking lot of fluids at home. Patient reports that his symptoms has not been improving with his home medications. He has history of cholecystectomy.   Hospital Course:   Acute on chronic pancreatitis  -Patient is n.p.o. secondary to previous tonsillar cancer.  -CAT scan showed acute focal pancreatitis on top of chronic pancreatitis. -Patient gets his diet through PEG tube, treated with bowel rest. -Supportive management with IV fluids and IV narcotics for pain control -Started on tube feeding back on 6/1, denies any nausea, vomiting or abdominal pain after meals.  Hypoxia  -Likely secondary to fluid overload, takes x-ray done showed emphysema new small bilateral effusion. -IV Lasix was given for  the new small bilateral effusion which likely iatrogenic from IV fluids. -Denies any shortness of breath today, sats okay on room air.   HTN (hypertension)  - Currently stable, continue metoprolol   Hypothyroidism (acquired)  - TSH is normal   Nicotine abuse:  - Patient was counseled strongly on quitting smoking, placed on nicotine patch   Chronic Pancytopenia  -Will monitor counts intermittently.   History of tonsillar cancer  - Recent PET scan in April 2015 was negative for any metastasis, follows Dr. Alvy Bimler   Cancer related pain/chronic opiates -Patient follows with oncology and primary care physician for his pain medication needs.   Procedures:  None  Consultations:  None  Discharge Exam: Filed Vitals:   03/05/14 0541  BP: 131/74  Pulse: 58  Temp: 98.2 F (36.8 C)  Resp: 16   General: Alert and awake, oriented x3, not in any acute distress. HEENT: anicteric sclera, pupils reactive to light and accommodation, EOMI CVS: S1-S2 clear, no murmur rubs or gallops Chest: clear to auscultation bilaterally, no wheezing, rales or rhonchi Abdomen: soft nontender, nondistended, normal bowel sounds, no organomegaly Extremities: no cyanosis, clubbing or edema noted bilaterally Neuro: Cranial nerves II-XII intact, no focal neurological deficits  Discharge Instructions You were cared for by a hospitalist during your hospital stay. If you have any questions about your discharge medications or the care you received while you were in the hospital after you are discharged, you can call the unit and asked to speak with the hospitalist on call if the hospitalist that took care of you is not available. Once you are discharged, your primary care physician will handle any further  medical issues. Please note that NO REFILLS for any discharge medications will be authorized once you are discharged, as it is imperative that you return to your primary care physician (or establish a relationship  with a primary care physician if you do not have one) for your aftercare needs so that they can reassess your need for medications and monitor your lab values.  Discharge Instructions   Increase activity slowly    Complete by:  As directed             Medication List         chlorhexidine 0.12 % solution  Commonly known as:  PERIDEX  Use as directed 15 mLs in the mouth or throat 3 (three) times daily. Use for 30 seconds each time.     doxylamine (Sleep) 25 MG tablet  Commonly known as:  UNISOM  Take 50 mg by mouth at bedtime as needed for sleep.     feeding supplement (OSMOLITE 1.5 CAL) Liqd  Take 237 mLs by mouth daily. Drinks 4 cans per day     ENSURE PO  Take 1 Can by mouth 3 (three) times daily.     fentaNYL 50 MCG/HR  Commonly known as:  DURAGESIC - dosed mcg/hr  Place 1 patch onto the skin every 3 (three) days.     FISH OIL PO  Take 1 capsule by mouth daily.     fluconazole 100 MG tablet  Commonly known as:  DIFLUCAN  Take 100 mg by mouth daily.     HYDROmorphone 2 MG tablet  Commonly known as:  DILAUDID  Take 2 mg by mouth every 4 (four) hours as needed for severe pain.     levothyroxine 50 MCG tablet  Commonly known as:  SYNTHROID, LEVOTHROID  TAKE 1 TABLET BY MOUTH DAILY BEFORE BREAKFAST     Lidocaine HCl 2 % Soln  Swish and spit 5 mLs every 6 (six) hours as needed (mouth sores).     lipase/protease/amylase 12000 UNITS Cpep capsule  Commonly known as:  CREON-12/PANCREASE  Take 1 capsule by mouth 4 (four) times daily.     magic mouthwash w/lidocaine Soln  Take 5 mLs by mouth every 6 (six) hours as needed (Swish and Spit).     metoprolol 50 MG tablet  Commonly known as:  LOPRESSOR  Take 50 mg by mouth 2 (two) times daily.     omeprazole 40 MG capsule  Commonly known as:  PRILOSEC  Take 40 mg by mouth daily.     oxyCODONE 15 MG immediate release tablet  Commonly known as:  ROXICODONE  Take 15 mg by mouth every 4 (four) hours.     polyethylene  glycol packet  Commonly known as:  MIRALAX  Take 17 g by mouth daily.     promethazine 25 MG tablet  Commonly known as:  PHENERGAN  Take 1 tablet (25 mg total) by mouth every 6 (six) hours as needed. For nausea     temazepam 30 MG capsule  Commonly known as:  RESTORIL  Take 30 mg by mouth at bedtime as needed for sleep.     VITAMIN D PO  Take 1 tablet by mouth daily.       No Known Allergies     Follow-up Information   Follow up with Harvie Junior, MD In 1 week.   Specialty:  Specialist   Contact information:   [address] [place] [address] [phone]        The  results of significant diagnostics from this hospitalization (including imaging, microbiology, ancillary and laboratory) are listed below for reference.    Significant Diagnostic Studies: Dg Chest 2 View  03/04/2014   CLINICAL DATA:  Shortness of breath and hypertension.  EXAM: CHEST  2 VIEW  COMPARISON:  05/22/2012.  CT scan from 01/25/2014.  FINDINGS: Lungs are hyperexpanded with attenuation of peripheral markings, consistent with emphysema. Since the previous CT scan, the patient has developed small bilateral pleural effusions, right more so than left. There is some dependent probable atelectasis at the right base. The cardiopericardial silhouette is within normal limits for size. Posterior right sixth rib fracture appears to have associated callus suggesting a nonacute injury, but this is new since the previous CT.  IMPRESSION: Emphysema with new small bilateral pleural effusions and right base atelectasis.  Subacute posterior right sixth rib fracture.   Electronically Signed   By: Misty Stanley M.D.   On: 03/04/2014 08:46   Ct Abdomen Pelvis W Contrast  02/28/2014   CLINICAL DATA:  Progressive severe abdominal pain. Nausea. Chronic pancreatitis. Personal history of tonsillar carcinoma.  EXAM: CT ABDOMEN AND PELVIS WITH CONTRAST  TECHNIQUE: Multidetector CT imaging of the abdomen and pelvis was  performed using the standard protocol following bolus administration of intravenous contrast.  CONTRAST:  139mL OMNIPAQUE IOHEXOL 300 MG/ML  SOLN  COMPARISON:  01/23/2014  FINDINGS: Changes of chronic calcific pancreatitis are noted. No demonstrated is swelling involving the pancreatic head and uncinate process with peripancreatic soft tissue swelling, consistent with acute focal pancreatitis. No discrete pancreatic mass identified. No evidence of pancreatic pseudocyst or biliary ductal dilatation. Surgical clips seen from prior cholecystectomy.  Percutaneous gastrostomy tube remains in appropriate position. No evidence of bowel obstruction. The liver, spleen, adrenal glands, and kidneys are normal in appearance. No evidence of hydronephrosis. No soft tissue masses or lymphadenopathy identified. No evidence of bowel wall thickening or dilatation.  IMPRESSION: Acute focal pancreatitis involving the pancreatic head and uncinate process, superimposed on chronic calcific pancreatitis.  No evidence of pancreatic pseudocyst or other complication.   Electronically Signed   By: Earle Gell M.D.   On: 02/28/2014 13:49    Microbiology: Recent Results (from the past 240 hour(s))  MRSA PCR SCREENING     Status: Abnormal   Collection Time    02/28/14  4:19 PM      Result Value Ref Range Status   MRSA by PCR POSITIVE (*) NEGATIVE Final   Comment: RESULT CALLED TO, READ BACK BY AND VERIFIED WITH:     P.MOSTELLER,RN AT 2145 ON 02/28/14 BY WSHEA.                The GeneXpert MRSA Assay (FDA     approved for NASAL specimens     only), is one component of a     comprehensive MRSA colonization     surveillance program. It is not     intended to diagnose MRSA     infection nor to guide or     monitor treatment for     MRSA infections.     Labs: Basic Metabolic Panel:  Recent Labs Lab 02/28/14 1006 03/01/14 0350 03/03/14 0602 03/04/14 0519 03/05/14 0529  NA 139 144 141 142 140  K 4.3 4.2 4.0 3.9 3.7  CL  97 106 105 104 99  CO2 28 26 26 29  32  GLUCOSE 106* 77 73 86 138*  BUN 10 8 6 6 10   CREATININE 0.60 0.64 0.59 0.54 0.59  CALCIUM 10.0 8.5 8.2* 8.4 8.8   Liver Function Tests:  Recent Labs Lab 02/28/14 1006 03/01/14 0350 03/04/14 0519  AST 24 19 21   ALT 12 10 11   ALKPHOS 78 63 69  BILITOT 0.4 0.4 0.3  PROT 8.3 6.5 6.7  ALBUMIN 4.4 3.5 3.4*    Recent Labs Lab 02/28/14 1006  LIPASE 12   No results found for this basename: AMMONIA,  in the last 168 hours CBC:  Recent Labs Lab 02/28/14 1006 03/01/14 0350 03/03/14 0602  WBC 3.3* 2.2* 2.8*  NEUTROABS 1.9  --   --   HGB 13.8 11.5* 12.0*  HCT 40.5 34.4* 36.7*  MCV 99.8 101.2* 102.8*  PLT PLATELET CLUMPS NOTED ON SMEAR, COUNT APPEARS DECREASED 104* 98*   Cardiac Enzymes: No results found for this basename: CKTOTAL, CKMB, CKMBINDEX, TROPONINI,  in the last 168 hours BNP: BNP (last 3 results) No results found for this basename: PROBNP,  in the last 8760 hours CBG: No results found for this basename: GLUCAP,  in the last 168 hours     Signed:  Verlee Monte  [facility] 03/05/2014, 11:13 AM

## 2014-03-05 NOTE — Progress Notes (Signed)
Pt given discharge instructions. No concerns voiced. Awaiting ride to be transported home. Vwilliams,rn.

## 2014-04-22 ENCOUNTER — Other Ambulatory Visit (HOSPITAL_COMMUNITY): Payer: Self-pay | Admitting: Interventional Radiology

## 2014-04-22 DIAGNOSIS — R633 Feeding difficulties, unspecified: Secondary | ICD-10-CM

## 2014-04-23 ENCOUNTER — Ambulatory Visit (HOSPITAL_COMMUNITY)
Admission: RE | Admit: 2014-04-23 | Discharge: 2014-04-23 | Disposition: A | Discharge status: Home / Self Care | Payer: Medicaid Other | Source: Ambulatory Visit | Attending: Interventional Radiology | Admitting: Interventional Radiology

## 2014-04-23 DIAGNOSIS — Z431 Encounter for attention to gastrostomy: Secondary | ICD-10-CM | POA: Diagnosis not present

## 2014-04-23 DIAGNOSIS — R633 Feeding difficulties, unspecified: Secondary | ICD-10-CM

## 2014-04-23 DIAGNOSIS — C099 Malignant neoplasm of tonsil, unspecified: Secondary | ICD-10-CM | POA: Diagnosis not present

## 2014-04-23 NOTE — Progress Notes (Signed)
Patient ID: Russell Williamson, male   DOB: [birth year], 58 y.o.   MRN: [id number] 20 french balloon retention gastrostomy tube replaced secondary to hole in distal portion. No immediate complications. See full dictation in [place] for details.

## 2014-06-04 ENCOUNTER — Ambulatory Visit (HOSPITAL_COMMUNITY)
Admission: RE | Admit: 2014-06-04 | Discharge: 2014-06-04 | Disposition: A | Discharge status: Home / Self Care | Payer: Medicaid Other | Source: Ambulatory Visit | Attending: Hematology and Oncology | Admitting: Hematology and Oncology

## 2014-06-04 ENCOUNTER — Encounter (HOSPITAL_COMMUNITY): Payer: Self-pay

## 2014-06-04 DIAGNOSIS — Z85819 Personal history of malignant neoplasm of unspecified site of lip, oral cavity, and pharynx: Secondary | ICD-10-CM | POA: Insufficient documentation

## 2014-06-04 DIAGNOSIS — R599 Enlarged lymph nodes, unspecified: Secondary | ICD-10-CM | POA: Insufficient documentation

## 2014-06-04 DIAGNOSIS — R911 Solitary pulmonary nodule: Secondary | ICD-10-CM | POA: Insufficient documentation

## 2014-06-04 DIAGNOSIS — R0602 Shortness of breath: Secondary | ICD-10-CM | POA: Diagnosis not present

## 2014-06-04 DIAGNOSIS — Z9221 Personal history of antineoplastic chemotherapy: Secondary | ICD-10-CM | POA: Diagnosis not present

## 2014-06-07 ENCOUNTER — Ambulatory Visit: Payer: Self-pay | Admitting: Hematology and Oncology

## 2014-06-07 ENCOUNTER — Other Ambulatory Visit: Payer: Self-pay

## 2014-06-07 ENCOUNTER — Telehealth: Payer: Self-pay | Admitting: Hematology and Oncology

## 2014-06-07 NOTE — Telephone Encounter (Signed)
returned pt call and gv pt new appt for lb/NG 9/14 @ 1pm. appt r/s from today.

## 2014-06-17 ENCOUNTER — Ambulatory Visit (HOSPITAL_BASED_OUTPATIENT_CLINIC_OR_DEPARTMENT_OTHER): Payer: Medicaid Other | Admitting: Hematology and Oncology

## 2014-06-17 ENCOUNTER — Encounter: Payer: Self-pay | Admitting: Hematology and Oncology

## 2014-06-17 ENCOUNTER — Other Ambulatory Visit (HOSPITAL_BASED_OUTPATIENT_CLINIC_OR_DEPARTMENT_OTHER): Payer: Medicaid Other

## 2014-06-17 VITALS — BP 126/72 | HR 108 | Temp 98.7°F | Resp 17 | Ht 68.0 in | Wt 124.8 lb

## 2014-06-17 DIAGNOSIS — G8929 Other chronic pain: Secondary | ICD-10-CM

## 2014-06-17 DIAGNOSIS — Z1212 Encounter for screening for malignant neoplasm of rectum: Secondary | ICD-10-CM

## 2014-06-17 DIAGNOSIS — K1230 Oral mucositis (ulcerative), unspecified: Secondary | ICD-10-CM

## 2014-06-17 DIAGNOSIS — M542 Cervicalgia: Secondary | ICD-10-CM

## 2014-06-17 DIAGNOSIS — C099 Malignant neoplasm of tonsil, unspecified: Secondary | ICD-10-CM

## 2014-06-17 DIAGNOSIS — E039 Hypothyroidism, unspecified: Secondary | ICD-10-CM

## 2014-06-17 DIAGNOSIS — Z299 Encounter for prophylactic measures, unspecified: Secondary | ICD-10-CM

## 2014-06-17 DIAGNOSIS — Z1211 Encounter for screening for malignant neoplasm of colon: Secondary | ICD-10-CM | POA: Insufficient documentation

## 2014-06-17 DIAGNOSIS — D61818 Other pancytopenia: Secondary | ICD-10-CM

## 2014-06-17 DIAGNOSIS — Z23 Encounter for immunization: Secondary | ICD-10-CM

## 2014-06-17 DIAGNOSIS — F17200 Nicotine dependence, unspecified, uncomplicated: Secondary | ICD-10-CM

## 2014-06-17 DIAGNOSIS — Z931 Gastrostomy status: Secondary | ICD-10-CM

## 2014-06-17 DIAGNOSIS — Z720 Tobacco use: Secondary | ICD-10-CM

## 2014-06-17 LAB — CBC WITH DIFFERENTIAL/PLATELET
BASO%: 0.6 % (ref 0.0–2.0)
Basophils Absolute: 0 10*3/uL (ref 0.0–0.1)
EOS%: 2.2 % (ref 0.0–7.0)
Eosinophils Absolute: 0.1 10*3/uL (ref 0.0–0.5)
HCT: 42 % (ref 38.4–49.9)
HGB: 13.7 g/dL (ref 13.0–17.1)
LYMPH%: 21.5 % (ref 14.0–49.0)
MCH: 33.9 pg — AB (ref 27.2–33.4)
MCHC: 32.6 g/dL (ref 32.0–36.0)
MCV: 103.9 fL — ABNORMAL HIGH (ref 79.3–98.0)
MONO#: 0.8 10*3/uL (ref 0.1–0.9)
MONO%: 23.3 % — ABNORMAL HIGH (ref 0.0–14.0)
NEUT#: 1.7 10*3/uL (ref 1.5–6.5)
NEUT%: 52.4 % (ref 39.0–75.0)
Platelets: 125 10*3/uL — ABNORMAL LOW (ref 140–400)
RBC: 4.04 10*6/uL — AB (ref 4.20–5.82)
RDW: 13.7 % (ref 11.0–14.6)
WBC: 3.3 10*3/uL — ABNORMAL LOW (ref 4.0–10.3)
lymph#: 0.7 10*3/uL — ABNORMAL LOW (ref 0.9–3.3)

## 2014-06-17 LAB — COMPREHENSIVE METABOLIC PANEL (CC13)
ALT: 20 U/L (ref 0–55)
AST: 27 U/L (ref 5–34)
Albumin: 4.3 g/dL (ref 3.5–5.0)
Alkaline Phosphatase: 76 U/L (ref 40–150)
Anion Gap: 10 mEq/L (ref 3–11)
BUN: 11.5 mg/dL (ref 7.0–26.0)
CALCIUM: 9.5 mg/dL (ref 8.4–10.4)
CO2: 27 mEq/L (ref 22–29)
CREATININE: 0.8 mg/dL (ref 0.7–1.3)
Chloride: 102 mEq/L (ref 98–109)
Glucose: 105 mg/dl (ref 70–140)
Potassium: 4 mEq/L (ref 3.5–5.1)
Sodium: 139 mEq/L (ref 136–145)
TOTAL PROTEIN: 8.2 g/dL (ref 6.4–8.3)
Total Bilirubin: 0.48 mg/dL (ref 0.20–1.20)

## 2014-06-17 LAB — TSH CHCC: TSH: 3.662 m(IU)/L (ref 0.320–4.118)

## 2014-06-17 LAB — T4, FREE: Free T4: 1.1 ng/dL (ref 0.80–1.80)

## 2014-06-17 MED ORDER — INFLUENZA VAC SPLIT QUAD 0.5 ML IM SUSY
0.5000 mL | PREFILLED_SYRINGE | Freq: Once | INTRAMUSCULAR | Status: AC
  Administered 2014-06-17: 0.5 mL via INTRAMUSCULAR
  Filled 2014-06-17: qty 0.5

## 2014-06-17 NOTE — Assessment & Plan Note (Signed)
His imaging study and clinical exam showed no evidence of recurrence. His is at high risk of recurrence of cancer due to persistent smoking. I recommend repeat imaging study in 4 months.

## 2014-06-17 NOTE — Assessment & Plan Note (Signed)
We discussed the importance of preventive care and reviewed the vaccination programs. He does not have any prior allergic reactions to influenza vaccination. He agrees to proceed with influenza vaccination today and we will administer it today at the clinic.  

## 2014-06-17 NOTE — Progress Notes (Signed)
[facility] OFFICE PROGRESS NOTE  Patient Care Team: Harvie Junior, MD as PCP - General (Specialist) Rozetta Nunnery, MD (Otolaryngology) Marye Round, MD (Radiation Oncology) Lenn Cal, DDS as Consulting Physician (Dentistry) Heath Lark, MD as Consulting Physician (Hematology and Oncology)  SUMMARY OF ONCOLOGIC HISTORY: This is a pleasant patient was found to have abnormal looking tonsil in 2012 by his dentist. 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 He underwent concurrent chemoradiation therapy with weekly cisplatin from 02/01/2011 through 03/22/2011. Chemotherapy was discontinued early due to 2 prolonged pancytopenia. In 2013, he was noted to have enlarging lymphadenopathy. 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 Last imaging study from 10/29/2013 showed no evidence of disease recurrence  INTERVAL HISTORY: Please see below for problem oriented charting. He complained of severe neck pain. He still has persistent mouth sores. He denies any dysphagia or diagnosis stage. He continued to smoke and attempting to quit. He denies recent alcohol intake.  REVIEW OF SYSTEMS:   Constitutional: Denies fevers, chills or abnormal weight loss Eyes: Denies blurriness of vision Respiratory: Denies cough, dyspnea or wheezes Cardiovascular: Denies palpitation, chest discomfort or lower extremity swelling Gastrointestinal:  Denies nausea, heartburn or change in bowel habits Skin: Denies abnormal skin rashes Lymphatics: Denies new lymphadenopathy or easy bruising Neurological:Denies numbness, tingling or new weaknesses Behavioral/Psych: Mood is stable, no new changes  All other systems were reviewed with the patient and are  negative.  I have reviewed the past medical history, past surgical history, social history and family history with the patient and they are unchanged from previous note.  ALLERGIES:  has No Known Allergies.  MEDICATIONS:  Current Outpatient Prescriptions  Medication Sig Dispense Refill  . Cholecalciferol (VITAMIN D PO) Take 1 tablet by mouth daily.      Marland Kitchen doxylamine, Sleep, (UNISOM) 25 MG tablet Take 50 mg by mouth at bedtime as needed for sleep.      . fentaNYL (DURAGESIC - DOSED MCG/HR) 50 MCG/HR Place 1 patch onto the skin every 3 (three) days.      Marland Kitchen levothyroxine (SYNTHROID, LEVOTHROID) 50 MCG tablet TAKE 1 TABLET BY MOUTH DAILY BEFORE BREAKFAST  90 tablet  3  . lipase/protease/amylase (CREON-12/PANCREASE) 12000 UNITS CPEP capsule Take 1 capsule by mouth 4 (four) times daily.      . metoprolol (LOPRESSOR) 50 MG tablet Take 50 mg by mouth 2 (two) times daily.      . Nutritional Supplements (ENSURE PO) Take 1 Can by mouth 3 (three) times daily.      . Nutritional Supplements (FEEDING SUPPLEMENT, OSMOLITE 1.5 CAL,) LIQD Take 237 mLs by mouth daily. Drinks 4 cans per day      . Omega-3 Fatty Acids (FISH OIL PO) Take 1 capsule by mouth daily.      Marland Kitchen omeprazole (PRILOSEC) 40 MG capsule Take 40 mg by mouth daily.       Marland Kitchen oxyCODONE (ROXICODONE) 15 MG immediate release tablet Take 15 mg by mouth every 4 (four) hours.       . polyethylene glycol (MIRALAX) packet Take 17 g by mouth daily.  30 each  1  . promethazine (PHENERGAN) 25 MG tablet Take 1 tablet (25 mg total) by mouth every 6 (six) hours as needed. For nausea  30 tablet  2  . temazepam (RESTORIL) 30  MG capsule Take 30 mg by mouth at bedtime as needed for sleep.      Marland Kitchen Alum & Mag Hydroxide-Simeth (MAGIC MOUTHWASH W/LIDOCAINE) SOLN Take 5 mLs by mouth every 6 (six) hours as needed (Swish and Spit).  500 mL  0  . chlorhexidine (PERIDEX) 0.12 % solution Use as directed 15 mLs in the mouth or throat 3 (three) times daily. Use for 30 seconds  each time.       No current facility-administered medications for this visit.    PHYSICAL EXAMINATION: ECOG PERFORMANCE STATUS: 1 - Symptomatic but completely ambulatory  Filed Vitals:   06/17/14 1355  BP: 126/72  Pulse: 108  Temp: 98.7 F (37.1 C)  Resp: 17   Filed Weights   06/17/14 1355  Weight: 124 lb 12.8 oz (56.609 kg)    GENERAL:alert, no distress and comfortable. He looks thin and cachectic SKIN: skin color, texture, turgor are normal, no rashes or significant lesions EYES: normal, Conjunctiva are pink and non-injected, sclera clear OROPHARYNX:no exudate, no erythema and lips, buccal mucosa, and tongue normal  NECK: Neck is fibrosis and mildly swollen from prior radiation. LYMPH:  no palpable lymphadenopathy in the cervical, axillary or inguinal LUNGS: clear to auscultation and percussion with normal breathing effort HEART: regular rate & rhythm and no murmurs and no lower extremity edema ABDOMEN:abdomen soft, non-tender and normal bowel sounds. Feeding tube site looks okay Musculoskeletal:no cyanosis of digits and no clubbing  NEURO: alert & oriented x 3 with fluent speech, no focal motor/sensory deficits  LABORATORY DATA:  I have reviewed the data as listed    Component Value Date/Time   NA 139 06/17/2014 1309   NA 140 03/05/2014 0529   NA 140 04/24/2012 1200   K 4.0 06/17/2014 1309   K 3.7 03/05/2014 0529   K 4.1 04/24/2012 1200   CL 99 03/05/2014 0529   CL 102 02/27/2013 1045   CL 98 04/24/2012 1200   CO2 27 06/17/2014 1309   CO2 32 03/05/2014 0529   CO2 30 04/24/2012 1200   GLUCOSE 105 06/17/2014 1309   GLUCOSE 138* 03/05/2014 0529   GLUCOSE 118* 02/27/2013 1045   GLUCOSE 102 04/24/2012 1200   BUN 11.5 06/17/2014 1309   BUN 10 03/05/2014 0529   BUN 13 04/24/2012 1200   CREATININE 0.8 06/17/2014 1309   CREATININE 0.59 03/05/2014 0529   CREATININE 0.6 04/24/2012 1200   CALCIUM 9.5 06/17/2014 1309   CALCIUM 8.8 03/05/2014 0529   CALCIUM 9.6 04/24/2012 1200   PROT 8.2 06/17/2014  1309   PROT 6.7 03/04/2014 0519   PROT 8.1 04/24/2012 1200   ALBUMIN 4.3 06/17/2014 1309   ALBUMIN 3.4* 03/04/2014 0519   AST 27 06/17/2014 1309   AST 21 03/04/2014 0519   AST 30 04/24/2012 1200   ALT 20 06/17/2014 1309   ALT 11 03/04/2014 0519   ALT 27 04/24/2012 1200   ALKPHOS 76 06/17/2014 1309   ALKPHOS 69 03/04/2014 0519   ALKPHOS 77 04/24/2012 1200   BILITOT 0.48 06/17/2014 1309   BILITOT 0.3 03/04/2014 0519   BILITOT 0.40 04/24/2012 1200   GFRNONAA >90 03/05/2014 0529   GFRAA >90 03/05/2014 0529    No results found for this basename: SPEP,  UPEP,   kappa and lambda light chains    Lab Results  Component Value Date   WBC 3.3* 06/17/2014   NEUTROABS 1.7 06/17/2014   HGB 13.7 06/17/2014   HCT 42.0 06/17/2014   MCV 103.9* 06/17/2014   PLT  125* 06/17/2014      Chemistry      Component Value Date/Time   NA 139 06/17/2014 1309   NA 140 03/05/2014 0529   NA 140 04/24/2012 1200   K 4.0 06/17/2014 1309   K 3.7 03/05/2014 0529   K 4.1 04/24/2012 1200   CL 99 03/05/2014 0529   CL 102 02/27/2013 1045   CL 98 04/24/2012 1200   CO2 27 06/17/2014 1309   CO2 32 03/05/2014 0529   CO2 30 04/24/2012 1200   BUN 11.5 06/17/2014 1309   BUN 10 03/05/2014 0529   BUN 13 04/24/2012 1200   CREATININE 0.8 06/17/2014 1309   CREATININE 0.59 03/05/2014 0529   CREATININE 0.6 04/24/2012 1200      Component Value Date/Time   CALCIUM 9.5 06/17/2014 1309   CALCIUM 8.8 03/05/2014 0529   CALCIUM 9.6 04/24/2012 1200   ALKPHOS 76 06/17/2014 1309   ALKPHOS 69 03/04/2014 0519   ALKPHOS 77 04/24/2012 1200   AST 27 06/17/2014 1309   AST 21 03/04/2014 0519   AST 30 04/24/2012 1200   ALT 20 06/17/2014 1309   ALT 11 03/04/2014 0519   ALT 27 04/24/2012 1200   BILITOT 0.48 06/17/2014 1309   BILITOT 0.3 03/04/2014 0519   BILITOT 0.40 04/24/2012 1200      ASSESSMENT & PLAN:  Malignant neoplasm of tonsil His imaging study and clinical exam showed no evidence of recurrence. His is at high risk of recurrence of cancer due to persistent smoking. I recommend  repeat imaging study in 4 months.  Nicotine abuse I spent some time counseling the patient the importance of tobacco cessation. He is currently not interested to quit now.   Pancytopenia The cause is unknown. He denies recent alcohol intake. It is possible he has mild persistent pancreatitis. I will observe only with blood work, history and physical examination.  Mucositis He has persistent mucositis of unknown etiology. I recommend followup with dentist.  Preventive measure We discussed the importance of preventive care and reviewed the vaccination programs. He does not have any prior allergic reactions to influenza vaccination. He agrees to proceed with influenza vaccination today and we will administer it today at the clinic.   Chronic neck pain His neck is thick and fibrosis from prior radiation. He has limited mobility. I recommend physical therapy evaluation. He may benefit from physical therapy and neck brace.  S/P gastrostomy His feeding tube site looks okay. He is dependent on feeding tube. I recommend a trial oral intake as tolerated.     Orders Placed This Encounter  Procedures  . CT Soft Tissue Neck W Contrast    Standing Status: Future     Number of Occurrences:      Standing Expiration Date: 09/17/2015    Order Specific Question:  Reason for Exam (SYMPTOM  OR DIAGNOSIS REQUIRED)    Answer:  tonsil cancer, mediastinal adenopathy, exclude recurrence    Order Specific Question:  Preferred imaging location?    Answer:  [facility]  . CT Chest W Contrast    Standing Status: Future     Number of Occurrences:      Standing Expiration Date: 08/17/2015    Order Specific Question:  Reason for Exam (SYMPTOM  OR DIAGNOSIS REQUIRED)    Answer:  tonsil cancer, mediastinal adenopathy, exclude recurrence    Order Specific Question:  Preferred imaging location?    Answer:  [facility]  . Comprehensive metabolic panel    Standing Status:  Future      Number of Occurrences:      Standing Expiration Date: 07/22/2015  . CBC with Differential    Standing Status: Future     Number of Occurrences:      Standing Expiration Date: 07/22/2015  . TSH    Standing Status: Future     Number of Occurrences:      Standing Expiration Date: 07/22/2015  . Ambulatory referral to Physical Therapy    Referral Priority:  Routine    Referral Type:  Physical Medicine    Referral Reason:  Specialty Services Required    Requested Specialty:  Physical Therapy    Number of Visits Requested:  1   All questions were answered. The patient knows to call the clinic with any problems, questions or concerns. No barriers to learning was detected. I spent 40 minutes counseling the patient face to face. The total time spent in the appointment was 55 minutes and more than 50% was on counseling and review of test results     [facility], [place], MD 06/17/2014 3:10 PM

## 2014-06-17 NOTE — Assessment & Plan Note (Signed)
His feeding tube site looks okay. He is dependent on feeding tube. I recommend a trial oral intake as tolerated.

## 2014-06-17 NOTE — Assessment & Plan Note (Signed)
His neck is thick and fibrosis from prior radiation. He has limited mobility. I recommend physical therapy evaluation. He may benefit from physical therapy and neck brace.

## 2014-06-17 NOTE — Assessment & Plan Note (Signed)
I spent some time counseling the patient the importance of tobacco cessation. He is currently not interested to quit now.  

## 2014-06-17 NOTE — Assessment & Plan Note (Signed)
The cause is unknown. He denies recent alcohol intake. It is possible he has mild persistent pancreatitis. I will observe only with blood work, history and physical examination.

## 2014-06-17 NOTE — Assessment & Plan Note (Signed)
He has persistent mucositis of unknown etiology. I recommend followup with dentist.

## 2014-06-18 ENCOUNTER — Other Ambulatory Visit: Payer: Self-pay | Admitting: Hematology and Oncology

## 2014-06-18 ENCOUNTER — Telehealth: Payer: Self-pay | Admitting: *Deleted

## 2014-06-18 ENCOUNTER — Telehealth: Payer: Self-pay | Admitting: Hematology and Oncology

## 2014-06-18 DIAGNOSIS — C099 Malignant neoplasm of tonsil, unspecified: Secondary | ICD-10-CM

## 2014-06-18 MED ORDER — MAGIC MOUTHWASH W/LIDOCAINE: 5.0000 mL | Freq: Four times a day (QID) | ORAL | Status: DC | PRN

## 2014-06-18 NOTE — Telephone Encounter (Signed)
Sister says pt asking for refill on his Magic Mouthwash.  He thought Dr. Alvy Bimler was going to send the order to his [facility].

## 2014-06-18 NOTE — Telephone Encounter (Signed)
done

## 2014-06-18 NOTE — Telephone Encounter (Signed)
no answer on ohome #  and cell # lady said it is wrong #.....mailed pt appt sched/avs and letter pt sched to see [facility] @ PT on 9.28 @ 2:30pm....sched pt for Jan 2016

## 2014-06-18 NOTE — Telephone Encounter (Signed)
Left VM for sister informing of MMW ordered.

## 2014-06-26 ENCOUNTER — Encounter: Payer: Self-pay | Admitting: Physical Therapy

## 2014-07-01 ENCOUNTER — Ambulatory Visit: Payer: Medicaid Other | Attending: Hematology and Oncology | Admitting: Physical Therapy

## 2014-07-01 DIAGNOSIS — IMO0001 Reserved for inherently not codable concepts without codable children: Secondary | ICD-10-CM | POA: Insufficient documentation

## 2014-07-01 DIAGNOSIS — I890 Lymphedema, not elsewhere classified: Secondary | ICD-10-CM | POA: Diagnosis not present

## 2014-07-01 DIAGNOSIS — M542 Cervicalgia: Secondary | ICD-10-CM | POA: Diagnosis not present

## 2014-07-10 ENCOUNTER — Ambulatory Visit: Payer: Medicaid Other | Attending: Hematology and Oncology | Admitting: Physical Therapy

## 2014-07-10 DIAGNOSIS — Z5189 Encounter for other specified aftercare: Secondary | ICD-10-CM | POA: Insufficient documentation

## 2014-07-10 DIAGNOSIS — M542 Cervicalgia: Secondary | ICD-10-CM | POA: Diagnosis not present

## 2014-07-10 DIAGNOSIS — I890 Lymphedema, not elsewhere classified: Secondary | ICD-10-CM | POA: Diagnosis not present

## 2014-07-22 ENCOUNTER — Ambulatory Visit: Payer: Medicaid Other

## 2014-07-22 DIAGNOSIS — Z5189 Encounter for other specified aftercare: Secondary | ICD-10-CM | POA: Diagnosis not present

## 2014-07-29 ENCOUNTER — Ambulatory Visit (HOSPITAL_COMMUNITY): Payer: Self-pay | Admitting: Dentistry

## 2014-07-29 ENCOUNTER — Telehealth (HOSPITAL_COMMUNITY): Payer: Self-pay

## 2014-07-29 NOTE — Telephone Encounter (Signed)
07/29/2014      Broken appt.  on 07/29/14 @ 11:00 for re-eval healing w/Dr. Enrique Sack. Called patient and left msg. regarding broken appt.  Patient returned call and stated he does not need to be seen and will not  reschedule appointment.  LRI

## 2014-07-30 ENCOUNTER — Encounter: Payer: Self-pay | Admitting: Physical Therapy

## 2014-08-01 ENCOUNTER — Other Ambulatory Visit: Payer: Self-pay | Admitting: Hematology and Oncology

## 2014-08-07 ENCOUNTER — Ambulatory Visit: Payer: Medicaid Other | Attending: Hematology and Oncology | Admitting: Physical Therapy

## 2014-08-07 DIAGNOSIS — Z5189 Encounter for other specified aftercare: Secondary | ICD-10-CM | POA: Insufficient documentation

## 2014-08-07 DIAGNOSIS — I890 Lymphedema, not elsewhere classified: Secondary | ICD-10-CM | POA: Insufficient documentation

## 2014-08-07 DIAGNOSIS — M542 Cervicalgia: Secondary | ICD-10-CM | POA: Insufficient documentation

## 2014-08-08 ENCOUNTER — Encounter: Payer: Self-pay | Admitting: Physical Therapy

## 2014-08-08 NOTE — Therapy (Signed)
Physical Therapy Treatment  Patient Details  Name: Russell Williamson MRN: [id number] Date of Birth: 1956/06/24  Encounter Date: 08/07/2014      PT End of Session - 08/08/14 0913    Visit Number 4   Number of Visits 4   PT Start Time 1129   PT Stop Time 1206   PT Time Calculation (min) 37 min      Past Medical History  Diagnosis Date  . Hypertension   . Chronic pancreatitis   . Arthritis   . Hx of radiation therapy 02/02/11 to 03/22/11    L tonsil  . Seizures     alcohol-related  . Cervical adenopathy 04/26/2012  . Urinary hesitancy   . Constipation   . Shortness of breath 05/23/2012    "lying down"  . History of blood transfusion ~ 2004    "from the pancreatitis"  . Daily headache 05/23/2012    "small ones"  . History of radiation therapy 02/02/2011-03/22/2011    head/neck,left tonsil  . G tube feedings     has a feeding tube in stomach  . Hypothyroidism 10/29/2013  . Thrush 11/02/2013  . Cancer 12/15/10    left tonsillar squamous cell carcinoma  . Tonsillar cancer     tonsillar ca dx 12/27/10  . 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 Bile duct stent placement      hx of  . Radical neck dissection  05/23/2012    w/mass excision  . Cholecystectomy  04/2005  . Tibia fracture surgery  1990's    left  . Fracture surgery    . Direct laryngoscopy  05/23/2012    Procedure: DIRECT LARYNGOSCOPY;  Surgeon: Rozetta Nunnery, MD;  Location: [place];  Service: ENT;  Laterality: N/A;  . Mass biopsy  05/23/2012    Procedure: NECK MASS BIOPSY;  Surgeon: Rozetta Nunnery, MD;  Location: [place];  Service: ENT;  Laterality: N/A;  . Radical neck dissection  05/23/2012    Procedure: RADICAL NECK DISSECTION;  Surgeon: Rozetta Nunnery, MD;  Location: [place];  Service: ENT;  Laterality: Left;  . Direct laryngoscopy N/A 03/23/2013    Procedure: DIRECT LARYNGOSCOPY;  Surgeon: Rozetta Nunnery, MD;  Location: [place];  Service: ENT;  Laterality: N/A;  . Esophageal dilation N/A 03/23/2013    Procedure: ESOPHAGEAL DILATION;  Surgeon: Rozetta Nunnery, MD;  Location: [place];  Service: ENT;  Laterality: N/A;    There were no vitals taken for this visit.  Visit Diagnosis:  Lymphedema      Subjective Assessment - 08/08/14 0903    Symptoms Still having some neck tightness, though it is better than it was.  Reports he has been doing his self manual lymph drainage and his ROM exercise.   Currently in Pain? Yes   Pain Score 3    Pain Location Neck   Pain Orientation Right   Pain Descriptors / Indicators Tightness   Pain Type Chronic pain   Pain Onset More than a month ago   Pain Frequency Intermittent   Aggravating Factors  coughing, skipping a day of his exercise and manual lymph drainage   Pain Relieving Factors rest, exercise          [facility] PT Assessment - 08/08/14 0907    Precautions   Precautions Other (comment)  cancer precautions   AROM   Cervical Flexion 10% loss  Cervical Extension 50% loss   Cervical - Right Side Bend WFL   Cervical - Left Side Bend 25% loss   Cervical - Right Rotation 50% loss   Cervical - Left Rotation 10% loss            Education - 08/08/14 0912    Education provided Yes   Education Details review of neck ROM exercises; review of self manual lymph drainage   Education Details Patient   Methods Explanation;Demonstration   Comprehension Verbalized understanding     In addition to above education, patient was treated with manual lymph drainage including, in supine, supraclavicular fossae; bilateral shoulder collectors and axillae; superficial and deep abdomen; posterior, lateral, and anterior neck; chin, cheeks and pre-and retroauricular areas.  Principles were reviewed with patient for self-massage.         Plan - 08/08/14 0913    Clinical Impression Statement Pt. reports he feels confident about his self manual  lymph drainage and his home exercise program.  His compression garment was ordered about two weeks ago so should be delivered soon.   PT Plan Discharge.        Problem List Patient Active Problem List   Diagnosis Date Noted  . Mucositis 06/17/2014  . Preventive measure 06/17/2014  . Chronic neck pain 06/17/2014  . S/P gastrostomy 06/17/2014  . Nicotine abuse 02/28/2014  . Pancytopenia 02/28/2014  . Hypothyroidism (acquired) 11/02/2013  . Hypothyroidism 10/29/2013  . 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 Cervical adenopathy 04/26/2012  . Hiatal hernia   . Hx of radiation therapy   . HTN (hypertension) 10/16/2011  . 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 Chronic pancreatitis             LYMPHEDEMA/ONCOLOGY QUESTIONNAIRE - 08/08/14 0909    4 cm superior to sternal notch around neck 35 cm.  (was 34 cm. on 07/01/14)   6 cm superior to sternal notch around neck 34 cm.  (was 34.2 cm. on 07/01/14)   8 cm superior to sternal notch around neck 34.8 cm.  (was 35.8 cm. on 07/01/14)              [place] - 08/08/14 0915    CC Long Term Goal  #1   Title Pt. will be knowledgeable about the maintenance phase of lymphedema treatment.   Baseline no knowledge   Status Achieved   CC Long Term Goal  #2   Title Pt.'s edema at 3 cm proximal to sternal notch will be reduced by at least 1 cm.   Baseline 35.8 cm.   Status Achieved   CC Long Term Goal  #3   Title Pt will be independent with a home exercise program.   Status Achieved   CC Long Term Goal  #4   Title Pt.'s overall pain will be decreased by 40% or more.   Status Achieved   CC Long Term Goal  #5   Title Pt.'s cervical active range of motion will be limited no more than 25%.   Status Not Met       PHYSICAL THERAPY DISCHARGE SUMMARY  Visits from Start of Care: 4  Current functional level related to goals / functional outcomes: See goals just above (all met but #5).   Remaining deficits: Limited neck ROM;  swelling will continue to require management.   Education / Equipment: Self-manual lymph drainage, neck ROM HEP, neck compression garment.  Plan: Patient agrees to discharge.  Patient goals were partially met. Patient is  being discharged due to meeting the stated rehab goals.  ?????        SALIS[place],DONNA 08/08/2014, 9:19 AM

## 2014-08-31 ENCOUNTER — Inpatient Hospital Stay (HOSPITAL_COMMUNITY)
Admission: EM | Admit: 2014-08-31 | Discharge: 2014-09-04 | DRG: 439 | Disposition: A | Discharge status: Home / Self Care | Payer: Medicaid Other | Attending: Internal Medicine | Admitting: Internal Medicine

## 2014-08-31 ENCOUNTER — Emergency Department (HOSPITAL_COMMUNITY): Payer: Medicaid Other

## 2014-08-31 ENCOUNTER — Encounter (HOSPITAL_COMMUNITY): Payer: Self-pay | Admitting: Nurse Practitioner

## 2014-08-31 DIAGNOSIS — C099 Malignant neoplasm of tonsil, unspecified: Secondary | ICD-10-CM | POA: Diagnosis present

## 2014-08-31 DIAGNOSIS — Z9221 Personal history of antineoplastic chemotherapy: Secondary | ICD-10-CM

## 2014-08-31 DIAGNOSIS — Z931 Gastrostomy status: Secondary | ICD-10-CM

## 2014-08-31 DIAGNOSIS — Z22322 Carrier or suspected carrier of Methicillin resistant Staphylococcus aureus: Secondary | ICD-10-CM

## 2014-08-31 DIAGNOSIS — I10 Essential (primary) hypertension: Secondary | ICD-10-CM | POA: Diagnosis present

## 2014-08-31 DIAGNOSIS — Z923 Personal history of irradiation: Secondary | ICD-10-CM

## 2014-08-31 DIAGNOSIS — D61818 Other pancytopenia: Secondary | ICD-10-CM | POA: Diagnosis present

## 2014-08-31 DIAGNOSIS — E876 Hypokalemia: Secondary | ICD-10-CM | POA: Diagnosis present

## 2014-08-31 DIAGNOSIS — R197 Diarrhea, unspecified: Secondary | ICD-10-CM | POA: Diagnosis present

## 2014-08-31 DIAGNOSIS — K852 Alcohol induced acute pancreatitis: Principal | ICD-10-CM | POA: Diagnosis present

## 2014-08-31 DIAGNOSIS — K860 Alcohol-induced chronic pancreatitis: Secondary | ICD-10-CM | POA: Diagnosis present

## 2014-08-31 DIAGNOSIS — K859 Acute pancreatitis without necrosis or infection, unspecified: Secondary | ICD-10-CM | POA: Diagnosis present

## 2014-08-31 DIAGNOSIS — R079 Chest pain, unspecified: Secondary | ICD-10-CM

## 2014-08-31 DIAGNOSIS — Z681 Body mass index (BMI) 19 or less, adult: Secondary | ICD-10-CM

## 2014-08-31 DIAGNOSIS — G8929 Other chronic pain: Secondary | ICD-10-CM | POA: Diagnosis present

## 2014-08-31 DIAGNOSIS — Z9049 Acquired absence of other specified parts of digestive tract: Secondary | ICD-10-CM | POA: Diagnosis present

## 2014-08-31 DIAGNOSIS — R109 Unspecified abdominal pain: Secondary | ICD-10-CM

## 2014-08-31 DIAGNOSIS — E440 Moderate protein-calorie malnutrition: Secondary | ICD-10-CM | POA: Diagnosis present

## 2014-08-31 DIAGNOSIS — F17210 Nicotine dependence, cigarettes, uncomplicated: Secondary | ICD-10-CM | POA: Diagnosis present

## 2014-08-31 DIAGNOSIS — E039 Hypothyroidism, unspecified: Secondary | ICD-10-CM | POA: Diagnosis present

## 2014-08-31 DIAGNOSIS — K863 Pseudocyst of pancreas: Secondary | ICD-10-CM | POA: Diagnosis present

## 2014-08-31 DIAGNOSIS — K858 Other acute pancreatitis without necrosis or infection: Secondary | ICD-10-CM

## 2014-08-31 HISTORY — DX: Other pancytopenia: D61.818

## 2014-08-31 HISTORY — DX: Carrier or suspected carrier of methicillin resistant Staphylococcus aureus: Z22.322

## 2014-08-31 LAB — CBC WITH DIFFERENTIAL/PLATELET
BASOS PCT: 0 % (ref 0–1)
Basophils Absolute: 0 10*3/uL (ref 0.0–0.1)
EOS ABS: 0 10*3/uL (ref 0.0–0.7)
EOS PCT: 0 % (ref 0–5)
HCT: 40.4 % (ref 39.0–52.0)
HEMOGLOBIN: 13.8 g/dL (ref 13.0–17.0)
Lymphocytes Relative: 23 % (ref 12–46)
Lymphs Abs: 1.2 10*3/uL (ref 0.7–4.0)
MCH: 34.4 pg — AB (ref 26.0–34.0)
MCHC: 34.2 g/dL (ref 30.0–36.0)
MCV: 100.7 fL — AB (ref 78.0–100.0)
MONOS PCT: 11 % (ref 3–12)
Monocytes Absolute: 0.6 10*3/uL (ref 0.1–1.0)
NEUTROS PCT: 66 % (ref 43–77)
Neutro Abs: 3.5 10*3/uL (ref 1.7–7.7)
Platelets: 140 10*3/uL — ABNORMAL LOW (ref 150–400)
RBC: 4.01 MIL/uL — ABNORMAL LOW (ref 4.22–5.81)
RDW: 12.9 % (ref 11.5–15.5)
WBC: 5.3 10*3/uL (ref 4.0–10.5)

## 2014-08-31 LAB — BASIC METABOLIC PANEL
Anion gap: 16 — ABNORMAL HIGH (ref 5–15)
BUN: 10 mg/dL (ref 6–23)
CALCIUM: 10.1 mg/dL (ref 8.4–10.5)
CO2: 26 mEq/L (ref 19–32)
CREATININE: 0.7 mg/dL (ref 0.50–1.35)
Chloride: 101 mEq/L (ref 96–112)
GFR calc non Af Amer: >90 mL/min (ref >90)
GLUCOSE: 106 mg/dL — AB (ref 70–99)
Potassium: 4 mEq/L (ref 3.7–5.3)
Sodium: 143 mEq/L (ref 137–147)

## 2014-08-31 LAB — LIPASE, BLOOD: Lipase: 55 U/L (ref 11–59)

## 2014-08-31 LAB — HEPATIC FUNCTION PANEL
ALK PHOS: 85 U/L (ref 39–117)
ALT: 15 U/L (ref 0–53)
AST: 33 U/L (ref 0–37)
Albumin: 4.1 g/dL (ref 3.5–5.2)
Total Bilirubin: 0.4 mg/dL (ref 0.3–1.2)
Total Protein: 8.1 g/dL (ref 6.0–8.3)

## 2014-08-31 LAB — TROPONIN I: Troponin I: <0.3 ng/mL (ref <0.30)

## 2014-08-31 MED ORDER — IOHEXOL 300 MG/ML  SOLN: 50.0000 mL | Freq: Once | INTRAMUSCULAR | Status: AC | PRN

## 2014-08-31 MED ORDER — HYDROMORPHONE HCL 1 MG/ML IJ SOLN
1.0000 mg | Freq: Once | INTRAMUSCULAR | Status: AC
  Administered 2014-09-01: 1 mg via INTRAVENOUS
  Filled 2014-08-31: qty 1

## 2014-08-31 MED ORDER — MORPHINE SULFATE 4 MG/ML IJ SOLN
4.0000 mg | Freq: Once | INTRAMUSCULAR | Status: AC
  Administered 2014-08-31: 4 mg via INTRAVENOUS
  Filled 2014-08-31: qty 1

## 2014-08-31 MED ORDER — GI COCKTAIL ~~LOC~~
30.0000 mL | Freq: Once | ORAL | Status: AC
  Administered 2014-09-01: 30 mL via ORAL
  Filled 2014-08-31: qty 30

## 2014-08-31 MED ORDER — SODIUM CHLORIDE 0.9 % IV BOLUS (SEPSIS)
1000.0000 mL | Freq: Once | INTRAVENOUS | Status: AC
  Administered 2014-08-31: 1000 mL via INTRAVENOUS

## 2014-08-31 MED ORDER — PANTOPRAZOLE SODIUM 40 MG IV SOLR
40.0000 mg | Freq: Once | INTRAVENOUS | Status: AC
  Administered 2014-09-01: 40 mg via INTRAVENOUS
  Filled 2014-08-31: qty 40

## 2014-08-31 MED ORDER — IOHEXOL 300 MG/ML  SOLN: 100.0000 mL | Freq: Once | INTRAMUSCULAR | Status: AC | PRN

## 2014-08-31 NOTE — ED Notes (Signed)
Pt presents with c/o of abdominal pain, states typical to pancreatisis flare up. Rates pain at 7/10. Denies n/v/d. Fever or chills.

## 2014-08-31 NOTE — ED Provider Notes (Signed)
TIME SEEN: 10:00 PM  CHIEF COMPLAINT: Abdominal pain, chest pain  HPI: Pt is a 58 y.o. 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 Alvy Bimler and imaging did not show any recent recurrence who presents emergency department with abdominal pain that radiates up into his chest. Heart his prior pancreatitis flares. Denies any fevers or chills. No nausea vomiting or diarrhea. No bloody stools or melena. Describes the pain as an achy pain. He states it is worse with walking around. He does have some mild shortness of breath which is slightly worse for him than baseline.  Denies any radiation of pain into his back. Denies a resource food. History is limited as he is a poor historian. Pain has been constant since yesterday. Denies alcohol use but does have a history of prior alcohol abuse.  States he chronically takes oxycodone 15 mg and this has not been helping his pain.  ROS: See HPI Constitutional: no fever  Eyes: no drainage  ENT: no runny nose   Cardiovascular:   chest pain  Resp:  SOB  GI: no vomiting GU: no dysuria Integumentary: no rash  Allergy: no hives  Musculoskeletal: no leg swelling  Neurological: no slurred speech ROS otherwise negative  PAST MEDICAL HISTORY/PAST SURGICAL HISTORY:  Past Medical History  Diagnosis Date  . Hypertension   . Chronic pancreatitis   . Arthritis   . Hx of radiation therapy 02/02/11 to 03/22/11    L tonsil  . Seizures     alcohol-related  . Cervical adenopathy 04/26/2012  . Urinary hesitancy   . Constipation   . Shortness of breath 05/23/2012    "lying down"  . History of blood transfusion ~ 2004    "from the pancreatitis"  . Daily headache 05/23/2012    "small ones"  . History of radiation therapy 02/02/2011-03/22/2011    head/neck,left tonsil  . G tube feedings     has a feeding tube in stomach  .  Hypothyroidism 10/29/2013  . Thrush 11/02/2013  . Cancer 12/15/10    left tonsillar squamous cell carcinoma  . Tonsillar cancer     tonsillar ca dx 12/27/10  . 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 Authorizing Provider  Alum & Mag Hydroxide-Simeth (MAGIC MOUTHWASH W/LIDOCAINE) SOLN Take 5 mLs by mouth every 6 (six) hours as needed (Swish and Spit). 06/18/14   Heath Lark, MD  chlorhexidine (PERIDEX) 0.12 % solution Use as directed 15 mLs in the mouth or throat 3 (three) times daily. Use for 30 seconds each time.    Historical Provider, MD  Cholecalciferol (VITAMIN D PO) Take 1 tablet by mouth daily.    Historical Provider, MD  doxylamine, Sleep, (UNISOM) 25 MG tablet Take 50 mg by mouth at bedtime as needed for sleep.    Historical Provider, MD  fentaNYL (DURAGESIC - DOSED MCG/HR) 50 MCG/HR Place 1 patch onto the skin every 3 (three) days.    Historical Provider, MD  levothyroxine (SYNTHROID, LEVOTHROID) 50 MCG tablet TAKE 1 TABLET BY MOUTH DAILY BEFORE BREAKFAST 02/01/14   Heath Lark, MD  lipase/protease/amylase (CREON-12/PANCREASE) 12000 UNITS CPEP capsule Take 1 capsule by mouth 4 (four) times daily.    Historical Provider, MD  metoprolol (LOPRESSOR) 50 MG tablet Take 50 mg by mouth 2 (two) times  daily.    Historical Provider, MD  Nutritional Supplements (ENSURE PO) Take 1 Can by mouth 3 (three) times daily.    Historical Provider, MD  Nutritional Supplements (FEEDING SUPPLEMENT, OSMOLITE 1.5 CAL,) LIQD Take 237 mLs by mouth daily. Drinks 4 cans per day    Historical Provider, MD  Omega-3 Fatty Acids (FISH OIL PO) Take 1 capsule by mouth daily.    Historical Provider, MD  omeprazole (PRILOSEC) 40 MG capsule Take 40 mg by mouth daily.     Historical Provider, MD  oxyCODONE (ROXICODONE) 15 MG immediate release tablet Take 15 mg by mouth every 4 (four) hours.     Historical Provider, MD   polyethylene glycol (MIRALAX / GLYCOLAX) packet MIX THE CONTENTS OF 1 PACKET IN DESIRED LIQUID AND DRINK DAILY 08/01/14   Heath Lark, MD  promethazine (PHENERGAN) 25 MG tablet Take 1 tablet (25 mg total) by mouth every 6 (six) hours as needed. For nausea 08/28/12   Maryanna Shape, NP  temazepam (RESTORIL) 30 MG capsule Take 30 mg by mouth at bedtime as needed for sleep.    Historical Provider, MD    ALLERGIES:  No Known Allergies  SOCIAL HISTORY:  History  Substance Use Topics  . Smoking status: Current Every Day Smoker -- 0.25 packs/day for 40 years    Types: Cigarettes  . Smokeless tobacco: Never Used     Comment: hx 1/2 -1 PPD  . Alcohol Use: No     Comment: 05/23/2012 hx alcohol abuse, quit ~ 2000    FAMILY HISTORY: Family History  Problem Relation Age of Onset  . COPD Mother     EXAM: BP 134/73 mmHg  Pulse 67  Temp(Src) 98.6 F (37 C) (Oral)  Resp 14  Ht 5\' 6"  (1.676 m)  Wt 121 lb (54.885 kg)  BMI 19.54 kg/m2  SpO2 99% CONSTITUTIONAL: Alert and oriented and responds appropriately to questions. Well-appearing; well-nourished HEAD: Normocephalic EYES: Conjunctivae clear, PERRL ENT: normal nose; no rhinorrhea; moist mucous membranes; pharynx without lesions noted NECK: Supple, no meningismus, no LAD; fibrotic changes to the neck which are chronic, no erythema or warmth or tenderness CARD: RRR; S1 and S2 appreciated; no murmurs, no clicks, no rubs, no gallops RESP: Normal chest excursion without splinting or tachypnea; breath sounds clear and equal bilaterally; no wheezes, no rhonchi, no rales, no hypoxia or respiratory distress ABD/GI: Normal bowel sounds; non-distended; soft, tender to palpation throughout the abdomen with mild voluntary guarding, no peritoneal signs, no rebound, negative Murphy sign BACK:  The back appears normal and is non-tender to palpation, there is no CVA tenderness EXT: Normal ROM in all joints; non-tender to palpation; no edema; normal  capillary refill; no cyanosis    SKIN: Normal color for age and race; warm NEURO: Moves all extremities equally PSYCH: The patient's mood and manner are appropriate. Grooming and personal hygiene are appropriate.  MEDICAL DECISION MAKING: Pt with complaints of chest pain and abdominal pain. He does have risk factors for ACS but his EKG is nonischemic. Will check troponin, chest x-ray. Story is very atypical for ACS and has been constant since yesterday. He is also describing abdominal pain that feels similar to his prior pancreatitis. He has had pancreatitis in the past has had a CT scan confirming acute pancreatitis with a normal lipase. Lipase today is normal. No leukocytosis. LFTs pending. Given he is diffusely tender to palpation with voluntary guarding, will proceed with a CT scan to further evaluate for possible complications of pancreatitis, colitis,  SBO, diverticulitis. We'll give pain medicine, IV fluids and reassess.  ED PROGRESS: 11:50 PM  Pt's x-ray is clear. Troponin negative. EKG is nonischemic and unchanged since 2013. Reports no pain improvement after morphine. He has been drinking contrast and states he has a burning sensation with drinking. We'll give GI cocktail, dose of Dilaudid and reassess. CT scan pending. I do not feel he needs serial sets of cardiac enzymes given his pain is atypical and has been constant since yesterday and he has a negative workup thus far.  Dr. Aline Brochure to follow up on CT scan.        EKG Interpretation  Date/Time:  Saturday August 31 2014 23:44:01 EST Ventricular Rate:  67 PR Interval:  140 QRS Duration: 64 QT Interval:  403 QTC Calculation: 425 R Axis:   70 Text Interpretation:  Sinus or ectopic atrial rhythm Probable anteroseptal infarct, old Baseline wander in lead(s) V6 No significant change since last tracing Confirmed by Alyn Riedinger,  DO, Krisi Azua [phone]) on 08/31/2014 11:51:17 PM        [place], DO 08/31/14 2357

## 2014-09-01 DIAGNOSIS — C099 Malignant neoplasm of tonsil, unspecified: Secondary | ICD-10-CM | POA: Diagnosis present

## 2014-09-01 DIAGNOSIS — K860 Alcohol-induced chronic pancreatitis: Secondary | ICD-10-CM

## 2014-09-01 DIAGNOSIS — E440 Moderate protein-calorie malnutrition: Secondary | ICD-10-CM | POA: Diagnosis present

## 2014-09-01 DIAGNOSIS — Z923 Personal history of irradiation: Secondary | ICD-10-CM | POA: Diagnosis not present

## 2014-09-01 DIAGNOSIS — Z9049 Acquired absence of other specified parts of digestive tract: Secondary | ICD-10-CM | POA: Diagnosis present

## 2014-09-01 DIAGNOSIS — E039 Hypothyroidism, unspecified: Secondary | ICD-10-CM | POA: Diagnosis present

## 2014-09-01 DIAGNOSIS — D61818 Other pancytopenia: Secondary | ICD-10-CM | POA: Diagnosis present

## 2014-09-01 DIAGNOSIS — R197 Diarrhea, unspecified: Secondary | ICD-10-CM | POA: Diagnosis present

## 2014-09-01 DIAGNOSIS — Z22322 Carrier or suspected carrier of Methicillin resistant Staphylococcus aureus: Secondary | ICD-10-CM | POA: Diagnosis not present

## 2014-09-01 DIAGNOSIS — F17210 Nicotine dependence, cigarettes, uncomplicated: Secondary | ICD-10-CM | POA: Diagnosis present

## 2014-09-01 DIAGNOSIS — K859 Acute pancreatitis without necrosis or infection, unspecified: Secondary | ICD-10-CM | POA: Insufficient documentation

## 2014-09-01 DIAGNOSIS — I10 Essential (primary) hypertension: Secondary | ICD-10-CM | POA: Diagnosis present

## 2014-09-01 DIAGNOSIS — Z681 Body mass index (BMI) 19 or less, adult: Secondary | ICD-10-CM | POA: Diagnosis not present

## 2014-09-01 DIAGNOSIS — Z9221 Personal history of antineoplastic chemotherapy: Secondary | ICD-10-CM | POA: Diagnosis not present

## 2014-09-01 DIAGNOSIS — K863 Pseudocyst of pancreas: Secondary | ICD-10-CM | POA: Diagnosis present

## 2014-09-01 DIAGNOSIS — K852 Alcohol induced acute pancreatitis: Secondary | ICD-10-CM | POA: Diagnosis not present

## 2014-09-01 DIAGNOSIS — G8929 Other chronic pain: Secondary | ICD-10-CM | POA: Diagnosis present

## 2014-09-01 DIAGNOSIS — R1013 Epigastric pain: Secondary | ICD-10-CM | POA: Diagnosis present

## 2014-09-01 DIAGNOSIS — Z931 Gastrostomy status: Secondary | ICD-10-CM | POA: Diagnosis not present

## 2014-09-01 DIAGNOSIS — E876 Hypokalemia: Secondary | ICD-10-CM | POA: Diagnosis present

## 2014-09-01 LAB — URINALYSIS, ROUTINE W REFLEX MICROSCOPIC
Bilirubin Urine: NEGATIVE
GLUCOSE, UA: NEGATIVE mg/dL
HGB URINE DIPSTICK: NEGATIVE
KETONES UR: NEGATIVE mg/dL
Leukocytes, UA: NEGATIVE
Nitrite: NEGATIVE
PH: 6.5 (ref 5.0–8.0)
PROTEIN: NEGATIVE mg/dL
Specific Gravity, Urine: 1.008 (ref 1.005–1.030)
Urobilinogen, UA: 0.2 mg/dL (ref 0.0–1.0)

## 2014-09-01 LAB — BASIC METABOLIC PANEL
Anion gap: 13 (ref 5–15)
BUN: 8 mg/dL (ref 6–23)
CO2: 26 meq/L (ref 19–32)
CREATININE: 0.69 mg/dL (ref 0.50–1.35)
Calcium: 9.5 mg/dL (ref 8.4–10.5)
Chloride: 101 mEq/L (ref 96–112)
GFR calc Af Amer: >90 mL/min (ref >90)
GFR calc non Af Amer: >90 mL/min (ref >90)
Glucose, Bld: 88 mg/dL (ref 70–99)
Potassium: 3.7 mEq/L (ref 3.7–5.3)
Sodium: 140 mEq/L (ref 137–147)

## 2014-09-01 LAB — CBC
HEMATOCRIT: 37.7 % — AB (ref 39.0–52.0)
Hemoglobin: 12.9 g/dL — ABNORMAL LOW (ref 13.0–17.0)
MCH: 34.5 pg — ABNORMAL HIGH (ref 26.0–34.0)
MCHC: 34.2 g/dL (ref 30.0–36.0)
MCV: 100.8 fL — ABNORMAL HIGH (ref 78.0–100.0)
Platelets: 130 10*3/uL — ABNORMAL LOW (ref 150–400)
RBC: 3.74 MIL/uL — AB (ref 4.22–5.81)
RDW: 12.9 % (ref 11.5–15.5)
WBC: 3.2 10*3/uL — AB (ref 4.0–10.5)

## 2014-09-01 LAB — MRSA PCR SCREENING: MRSA by PCR: POSITIVE — AB

## 2014-09-01 MED ORDER — ACETAMINOPHEN 650 MG RE SUPP: 650.0000 mg | Freq: Four times a day (QID) | RECTAL | Status: DC | PRN

## 2014-09-01 MED ORDER — LEVOTHYROXINE SODIUM 25 MCG PO TABS
25.0000 ug | ORAL_TABLET | Freq: Every day | ORAL | Status: DC
  Administered 2014-09-01 – 2014-09-04 (×4): 25 ug via ORAL
  Filled 2014-09-01 (×6): qty 1

## 2014-09-01 MED ORDER — MORPHINE SULFATE 2 MG/ML IJ SOLN
2.0000 mg | INTRAMUSCULAR | Status: DC | PRN
  Administered 2014-09-01 – 2014-09-02 (×6): 2 mg via INTRAVENOUS
  Filled 2014-09-01 (×6): qty 1

## 2014-09-01 MED ORDER — HYDROMORPHONE HCL 1 MG/ML IJ SOLN
1.0000 mg | INTRAMUSCULAR | Status: DC | PRN
  Administered 2014-09-01 (×2): 1 mg via INTRAVENOUS
  Filled 2014-09-01 (×2): qty 1

## 2014-09-01 MED ORDER — IOHEXOL 300 MG/ML  SOLN
100.0000 mL | Freq: Once | INTRAMUSCULAR | Status: AC | PRN
  Administered 2014-09-01: 100 mL via INTRAVENOUS

## 2014-09-01 MED ORDER — MUPIROCIN 2 % EX OINT
1.0000 "application " | TOPICAL_OINTMENT | Freq: Two times a day (BID) | CUTANEOUS | Status: DC
  Administered 2014-09-01 – 2014-09-04 (×7): 1 via NASAL
  Filled 2014-09-01: qty 22

## 2014-09-01 MED ORDER — CHLORHEXIDINE GLUCONATE CLOTH 2 % EX PADS
6.0000 | MEDICATED_PAD | Freq: Every day | CUTANEOUS | Status: DC
Start: 2014-09-01 — End: 2014-09-04
  Administered 2014-09-01 – 2014-09-04 (×4): 6 via TOPICAL

## 2014-09-01 MED ORDER — CETYLPYRIDINIUM CHLORIDE 0.05 % MT LIQD
7.0000 mL | Freq: Two times a day (BID) | OROMUCOSAL | Status: DC
  Administered 2014-09-01 – 2014-09-04 (×5): 7 mL via OROMUCOSAL

## 2014-09-01 MED ORDER — TEMAZEPAM 15 MG PO CAPS
15.0000 mg | ORAL_CAPSULE | Freq: Every evening | ORAL | Status: DC | PRN
  Administered 2014-09-01: 15 mg via ORAL
  Filled 2014-09-01: qty 1

## 2014-09-01 MED ORDER — HYDROMORPHONE HCL 1 MG/ML IJ SOLN: 1.0000 mg | INTRAMUSCULAR | Status: DC | PRN

## 2014-09-01 MED ORDER — SODIUM CHLORIDE 0.9 % IV SOLN: 250.0000 mL | INTRAVENOUS | Status: DC | PRN

## 2014-09-01 MED ORDER — SODIUM CHLORIDE 0.9 % IJ SOLN: 3.0000 mL | INTRAMUSCULAR | Status: DC | PRN

## 2014-09-01 MED ORDER — CHLORHEXIDINE GLUCONATE 0.12 % MT SOLN
15.0000 mL | Freq: Two times a day (BID) | OROMUCOSAL | Status: DC
  Administered 2014-09-01 – 2014-09-04 (×7): 15 mL via OROMUCOSAL
  Filled 2014-09-01 (×9): qty 15

## 2014-09-01 MED ORDER — HYDROMORPHONE HCL 1 MG/ML IJ SOLN
1.0000 mg | Freq: Once | INTRAMUSCULAR | Status: AC
  Administered 2014-09-01: 1 mg via INTRAVENOUS
  Filled 2014-09-01: qty 1

## 2014-09-01 MED ORDER — HYDROXYZINE PAMOATE 25 MG PO CAPS
25.0000 mg | ORAL_CAPSULE | Freq: Every day | ORAL | Status: DC
  Filled 2014-09-01: qty 1

## 2014-09-01 MED ORDER — ACETAMINOPHEN 325 MG PO TABS: 650.0000 mg | ORAL_TABLET | Freq: Four times a day (QID) | ORAL | Status: DC | PRN

## 2014-09-01 MED ORDER — SODIUM CHLORIDE 0.9 % IV SOLN: INTRAVENOUS | Status: DC

## 2014-09-01 MED ORDER — PANTOPRAZOLE SODIUM 40 MG IV SOLR
40.0000 mg | Freq: Two times a day (BID) | INTRAVENOUS | Status: DC
  Administered 2014-09-01 – 2014-09-04 (×6): 40 mg via INTRAVENOUS
  Filled 2014-09-01 (×7): qty 40

## 2014-09-01 MED ORDER — ENOXAPARIN SODIUM 40 MG/0.4ML ~~LOC~~ SOLN
40.0000 mg | SUBCUTANEOUS | Status: DC
  Administered 2014-09-01 – 2014-09-04 (×4): 40 mg via SUBCUTANEOUS
  Filled 2014-09-01 (×6): qty 0.4

## 2014-09-01 MED ORDER — FENTANYL 50 MCG/HR TD PT72
50.0000 ug | MEDICATED_PATCH | TRANSDERMAL | Status: DC
  Administered 2014-09-02: 50 ug via TRANSDERMAL
  Filled 2014-09-01: qty 1

## 2014-09-01 MED ORDER — DEXTROSE-NACL 5-0.45 % IV SOLN
INTRAVENOUS | Status: DC
  Administered 2014-09-01: 05:00:00 via INTRAVENOUS

## 2014-09-01 MED ORDER — HYDROMORPHONE HCL 1 MG/ML IJ SOLN
1.0000 mg | INTRAMUSCULAR | Status: DC | PRN
  Administered 2014-09-01: 2 mg via INTRAVENOUS
  Filled 2014-09-01: qty 2

## 2014-09-01 MED ORDER — DEXTROSE-NACL 5-0.45 % IV SOLN
INTRAVENOUS | Status: DC
  Administered 2014-09-01 – 2014-09-04 (×5): via INTRAVENOUS

## 2014-09-01 MED ORDER — OXYCODONE HCL 5 MG PO TABS
15.0000 mg | ORAL_TABLET | ORAL | Status: DC | PRN
  Administered 2014-09-01 – 2014-09-03 (×9): 15 mg via ORAL
  Filled 2014-09-01 (×10): qty 3

## 2014-09-01 MED ORDER — HYDROMORPHONE HCL 1 MG/ML IJ SOLN: 1.0000 mg | Freq: Four times a day (QID) | INTRAMUSCULAR | Status: DC | PRN

## 2014-09-01 MED ORDER — PANTOPRAZOLE SODIUM 40 MG PO TBEC
80.0000 mg | DELAYED_RELEASE_TABLET | Freq: Every day | ORAL | Status: DC
  Administered 2014-09-01: 80 mg via ORAL
  Filled 2014-09-01: qty 2

## 2014-09-01 MED ORDER — SODIUM CHLORIDE 0.9 % IJ SOLN
3.0000 mL | Freq: Two times a day (BID) | INTRAMUSCULAR | Status: DC
  Administered 2014-09-01 – 2014-09-04 (×3): 3 mL via INTRAVENOUS

## 2014-09-01 MED ORDER — METOPROLOL TARTRATE 50 MG PO TABS
50.0000 mg | ORAL_TABLET | Freq: Two times a day (BID) | ORAL | Status: DC
  Administered 2014-09-01 – 2014-09-04 (×7): 50 mg via ORAL
  Filled 2014-09-01 (×9): qty 1

## 2014-09-01 MED ORDER — HYDROXYZINE HCL 25 MG PO TABS
25.0000 mg | ORAL_TABLET | Freq: Every day | ORAL | Status: DC
  Administered 2014-09-01 – 2014-09-03 (×3): 25 mg via ORAL
  Filled 2014-09-01 (×2): qty 1

## 2014-09-01 MED ORDER — PROMETHAZINE HCL 25 MG/ML IJ SOLN
25.0000 mg | Freq: Four times a day (QID) | INTRAMUSCULAR | Status: DC | PRN
  Administered 2014-09-03: 25 mg via INTRAVENOUS
  Filled 2014-09-01: qty 1

## 2014-09-01 NOTE — Progress Notes (Signed)
[facility] PROGRESS NOTE  Russell Williamson XOV:[id number] DOB: 03-Jun-1956 DOA: 08/31/2014 PCP: Harvie Junior, MD  Assessment/Plan: 1-Acute on Chronic Pancreatitis;  Ct abdomen: Slight haziness i the pancreas might be chronic, residual 2.1 cm pseudocyst slight decrease in size since prior study.  Continue with support care, Iv fluids, antiemetics.  Will increase dilaudid to 1-2 mg IV PRN.  Will add IV phenergan.  Continue with NPO status.  Resume chronic pain meds.   2-Hypothyroidism; S/P neck radiation. Continue with synthroid.   3-HTN; continue with metoprolol.   Code Status: Full Code.  Family Communication: Care discussed with patient.  Disposition Plan: Remain inpatient.    Consultants:  none  Procedures:  none  Antibiotics:  None.   HPI/Subjective: He is still complaining of abdominal pain, pain medication not enough.  Has some nausea. He uses at home phenergan.    Objective: Filed Vitals:   09/01/14 0355  BP: 146/91  Pulse: 55  Temp: 98.2 F (36.8 C)  Resp: 16    Intake/Output Summary (Last 24 hours) at 09/01/14 0931 Last data filed at 09/01/14 0147  Gross per 24 hour  Intake   1000 ml  Output      0 ml  Net   1000 ml   Filed Weights   08/31/14 1953 09/01/14 0355  Weight: 54.885 kg (121 lb) 53.2 kg (117 lb 4.6 oz)    Exam:   General:  Alert in no distress.   Cardiovascular: S 1, S 2 RRR  Respiratory: CTA  Abdomen: BS present, soft, mild tenderness.   Musculoskeletal: no edema.   Data Reviewed: Basic Metabolic Panel:  Recent Labs Lab 08/31/14 2008 09/01/14 0544  NA 143 140  K 4.0 3.7  CL 101 101  CO2 26 26  GLUCOSE 106* 88  BUN 10 8  CREATININE 0.70 0.69  CALCIUM 10.1 9.5   Liver Function Tests:  Recent Labs Lab 08/31/14 2255  AST 33  ALT 15  ALKPHOS 85  BILITOT 0.4  PROT 8.1  ALBUMIN 4.1    Recent Labs Lab 08/31/14 2008  LIPASE 55   No results for input(s): AMMONIA in the last 168  hours. CBC:  Recent Labs Lab 08/31/14 2008 09/01/14 0544  WBC 5.3 3.2*  NEUTROABS 3.5  --   HGB 13.8 12.9*  HCT 40.4 37.7*  MCV 100.7* 100.8*  PLT 140* 130*   Cardiac Enzymes:  Recent Labs Lab 08/31/14 2255  TROPONINI <0.30   BNP (last 3 results) No results for input(s): PROBNP in the last 8760 hours. CBG: No results for input(s): GLUCAP in the last 168 hours.  Recent Results (from the past 240 hour(s))  MRSA PCR Screening     Status: Abnormal   Collection Time: 09/01/14  3:53 AM  Result Value Ref Range Status   MRSA by PCR POSITIVE (A) NEGATIVE Final    Comment:        The GeneXpert MRSA Assay (FDA approved for NASAL specimens only), is one component of a comprehensive MRSA colonization surveillance program. It is not intended to diagnose MRSA infection nor to guide or monitor treatment for MRSA infections. RESULT CALLED TO, READ BACK BY AND VERIFIED WITH: Laury Axon [id number] @ 0523 BY J SCOTTON      Studies: Dg Chest 2 View  08/31/2014   CLINICAL DATA:  Acute onset of chest pain. Current history of smoking. Initial encounter.  EXAM: CHEST  2 VIEW  COMPARISON:  Chest radiograph performed 03/04/2014  FINDINGS: The lungs are well-aerated and  clear. There is no evidence of focal opacification, pleural effusion or pneumothorax. Bilateral nipple shadows are noted.  The heart is normal in size; the mediastinal contour is within normal limits. No acute osseous abnormalities are seen. Chronic left-sided rib deformity is again noted. Clips are noted within the right upper quadrant, reflecting prior cholecystectomy.  IMPRESSION: No acute cardiopulmonary process seen.   Electronically Signed   By: Garald Balding M.D.   On: 08/31/2014 22:29   Ct Abdomen Pelvis W Contrast  09/01/2014   CLINICAL DATA:  Acute onset of upper abdominal pain, and left upper back pain. Personal history of pancreatitis. Initial encounter.  EXAM: CT ABDOMEN AND PELVIS WITH CONTRAST  TECHNIQUE:  Multidetector CT imaging of the abdomen and pelvis was performed using the standard protocol following bolus administration of intravenous contrast.  CONTRAST:  136mL OMNIPAQUE IOHEXOL 300 MG/ML  SOLN  COMPARISON:  CT of the abdomen and pelvis from 02/28/2014  FINDINGS: Bibasilar atelectasis or scarring is noted.  The liver and spleen are unremarkable in appearance. The patient is status post cholecystectomy, with clips noted along the gallbladder fossa.  Calcification is noted at the pancreatic head and pancreatic tail, likely reflecting chronic sequelae of pancreatitis. Slight haziness about the pancreatic head may be chronic in nature, given the lack of an elevated lipase. An apparent residual 2.1 cm pseudocyst is noted anterior to the body of the pancreas, slightly decreased in size from the prior study. The adrenal glands are unremarkable in appearance.  An 8 mm cyst is noted at the lower pole of the right kidney. The kidneys are otherwise unremarkable in appearance. There is no evidence of hydronephrosis. No renal or ureteral stones are seen. No perinephric stranding is appreciated.  No free fluid is identified. The small bowel is unremarkable in appearance. The stomach is within normal limits. No acute vascular abnormalities are seen. Diffuse calcification is seen along the abdominal aorta and its branches, including at the origin of the left renal artery. Two right-sided renal arteries are seen.  The appendix is not definitely characterized; there is no evidence for appendicitis. The colon is unremarkable in appearance.  The bladder is mildly distended and grossly unremarkable. The prostate remains normal in size, with minimal calcification. No inguinal lymphadenopathy is seen.  No acute osseous abnormalities are identified.  IMPRESSION: 1. Slight haziness about the pancreatic head may be chronic in nature, given the lack of an elevated lipase. Apparent residual 2.1 cm pseudocyst seen anterior to the body of  the pancreas, slightly decreased in size from the prior study. Calcification at the pancreatic head and tail likely reflect chronic sequelae of pancreatitis. 2. Bibasilar atelectasis or scarring noted. 3. Small right renal cyst seen. 4. Diffuse calcification along the abdominal aorta and its branches, including at the origin of the left renal artery.   Electronically Signed   By: Garald Balding M.D.   On: 09/01/2014 00:31    Scheduled Meds: . antiseptic oral rinse  7 mL Mouth Rinse q12n4p  . chlorhexidine  15 mL Mouth Rinse BID  . Chlorhexidine Gluconate Cloth  6 each Topical Q0600  . enoxaparin (LOVENOX) injection  40 mg Subcutaneous Q24H  . [START ON 09/02/2014] fentaNYL  50 mcg Transdermal Q72H  . hydrOXYzine  25 mg Oral QHS  . levothyroxine  25 mcg Oral QAC breakfast  . metoprolol  50 mg Oral BID  . mupirocin ointment  1 application Nasal BID  . pantoprazole  80 mg Oral Daily  . sodium chloride  3 mL Intravenous Q12H   Continuous Infusions: . dextrose 5 % and 0.45% NaCl 100 mL/hr at 09/01/14 3200    Principal Problem:   Chronic alcoholic pancreatitis Active Problems:   Hypothyroidism (acquired)    Time spent: 35 minutes.     Niel Hummer A  [facility] Pager [phone]. If 7PM-7AM, please contact night-coverage at [email or web address], password [facility] 09/01/2014, 9:31 AM  LOS: 1 day

## 2014-09-01 NOTE — H&P (Signed)
[facility] History and Physical  Russell Williamson WCH:[id number] DOB: 1956/01/09 DOA: 08/31/2014  Referring physician: Pamella Pert, md PCP: Harvie Junior, MD   Chief Complaint: epigastris pain      HPI: Russell Williamson is a 58 y.o. male with chronic alcoholic pancreatitis on replacement.  Presents today to ED with increased pain he feels is due to the same.  Pain is sharp, epigastric, non radiating, 8/10 at tis worst.  On pain meds (fentanyl patch and oxycodone) per pain specialist.  On creon.  Has had nausea without vomiting for past 24 hours when thsi flare up started.  Lipase and imaging neg here in ED so far.  Admit pt to obs.In ED lipase neg and CE neg. Labs unremarkable, imaging consistent with chronic pancreatitis   Review of Systems:  Constitutional:  No weight loss, night sweats, Fevers, chills, fatigue.  HEENT:  Chronic dysphagia, No headaches, Tooth/dental problems,Sore throat,  No sneezing, itching, ear ache, nasal congestion, post nasal drip,  Cardio-vascular:  No chest pain, Orthopnea, PND, swelling in lower extremities, anasarca, dizziness, palpitations  GI:  Epigastric abd pain with N/V, no indigestion,  diarrhea, change in bowel habits, loss of appetite  Resp:  No shortness of breath with exertion or at rest. No cough, No coughing up of blood.No change in color of mucus.No wheezing.No chest wall deformity  Skin:  no rash or lesions.  GU:  no dysuria, change in color of urine, no urgency or frequency. No flank pain.  Musculoskeletal:  No joint pain or swelling. No decreased range of motion. No back pain.  Psych:  No change in mood or affect. No depression or anxiety. No memory loss.   Past Medical History  Diagnosis Date  . Hypertension   . Chronic pancreatitis   . Arthritis   . Hx of radiation therapy 02/02/11 to 03/22/11    L tonsil  . Seizures     alcohol-related  . Cervical adenopathy 04/26/2012  . Urinary hesitancy   . Constipation   .  Shortness of breath 05/23/2012    "lying down"  . History of blood transfusion ~ 2004    "from the pancreatitis"  . Daily headache 05/23/2012    "small ones"  . History of radiation therapy 02/02/2011-03/22/2011    head/neck,left tonsil  . G tube feedings     has a feeding tube in stomach  . Hypothyroidism 10/29/2013  . Thrush 11/02/2013  . Cancer 12/15/10    left tonsillar squamous cell carcinoma  . Tonsillar cancer     tonsillar ca dx 12/27/10  . 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 Bile duct stent placement      hx of  . Radical neck dissection  05/23/2012    w/mass excision  . Cholecystectomy  04/2005  . Tibia fracture surgery  1990's    left  . Fracture surgery    . Direct laryngoscopy  05/23/2012    Procedure: DIRECT LARYNGOSCOPY;  Surgeon: Rozetta Nunnery, MD;  Location: [place];  Service: ENT;  Laterality: N/A;  . Mass biopsy  05/23/2012    Procedure: NECK MASS BIOPSY;  Surgeon: Rozetta Nunnery, MD;  Location: [place];  Service: ENT;  Laterality: N/A;  . Radical neck dissection  05/23/2012    Procedure: RADICAL NECK DISSECTION;  Surgeon: Rozetta Nunnery, MD;  Location: [place];  Service: ENT;  Laterality: Left;  . Direct laryngoscopy N/A 03/23/2013  Procedure: DIRECT LARYNGOSCOPY;  Surgeon: Rozetta Nunnery, MD;  Location: [place];  Service: ENT;  Laterality: N/A;  . Esophageal dilation N/A 03/23/2013    Procedure: ESOPHAGEAL DILATION;  Surgeon: Rozetta Nunnery, MD;  Location: [place];  Service: ENT;  Laterality: N/A;   Social History:  reports that he has been smoking Cigarettes.  He has a 10 pack-year smoking history. He has never used smokeless tobacco. He reports that he uses illicit drugs (Marijuana). He reports that he does not drink alcohol.  No Known Allergies  Family History  Problem Relation Age of Onset  . COPD Mother      Prior to Admission  medications   Medication Sig Start Date End Date Taking? Authorizing Provider  Cholecalciferol (VITAMIN D PO) Take 1,000 mg by mouth daily.    Yes Historical Provider, MD  doxylamine, Sleep, (UNISOM) 25 MG tablet Take 75 mg by mouth at bedtime as needed for sleep (for sleep when patient is out of temazepam).    Yes Historical Provider, MD  hydrOXYzine (VISTARIL) 25 MG capsule Take 25 mg by mouth at bedtime. 08/15/14  Yes Historical Provider, MD  levothyroxine (SYNTHROID, LEVOTHROID) 25 MCG tablet Take 25 mcg by mouth daily. 08/08/14  Yes Historical Provider, MD  lipase/protease/amylase (CREON-12/PANCREASE) 12000 UNITS CPEP capsule Take 1 capsule by mouth 4 (four) times daily.   Yes Historical Provider, MD  metoprolol (LOPRESSOR) 50 MG tablet Take 50 mg by mouth 2 (two) times daily.   Yes Historical Provider, MD  Nutritional Supplements (ENSURE PO) Take 1 Can by mouth 3 (three) times daily.   Yes Historical Provider, MD  Nutritional Supplements (FEEDING SUPPLEMENT, OSMOLITE 1.5 CAL,) LIQD Take 237 mLs by mouth daily. Drinks 4 cans per day   Yes Historical Provider, MD  Omega-3 Fatty Acids (FISH OIL PO) Take 1 capsule by mouth daily.   Yes Historical Provider, MD  omeprazole (PRILOSEC) 40 MG capsule Take 40 mg by mouth daily.    Yes Historical Provider, MD  oxyCODONE (ROXICODONE) 15 MG immediate release tablet Take 15 mg by mouth every 4 (four) hours.    Yes Historical Provider, MD  polyethylene glycol (MIRALAX / GLYCOLAX) packet MIX THE CONTENTS OF 1 PACKET IN DESIRED LIQUID AND DRINK DAILY 08/01/14  Yes Heath Lark, MD  promethazine (PHENERGAN) 25 MG tablet Take 1 tablet (25 mg total) by mouth every 6 (six) hours as needed. For nausea 08/28/12  Yes Maryanna Shape, NP  temazepam (RESTORIL) 30 MG capsule Take 30 mg by mouth as directed. Patient takes 30 mg at bedtime, and if he awakes in the middle of the nigh patient takes another 30 mg.   Yes Historical Provider, MD  Alum & Mag Hydroxide-Simeth  (MAGIC MOUTHWASH W/LIDOCAINE) SOLN Take 5 mLs by mouth every 6 (six) hours as needed (Swish and Spit). Patient not taking: Reported on 08/31/2014 06/18/14   Heath Lark, MD  fentaNYL (DURAGESIC - DOSED MCG/HR) 50 MCG/HR Place 1 patch onto the skin every 3 (three) days.    Historical Provider, MD  levothyroxine (SYNTHROID, LEVOTHROID) 50 MCG tablet TAKE 1 TABLET BY MOUTH DAILY BEFORE BREAKFAST Patient not taking: Reported on 08/31/2014 02/01/14   Heath Lark, MD   Physical Exam: Filed Vitals:   08/31/14 1953 08/31/14 2344 09/01/14 0251 09/01/14 0355  BP: 134/73 142/71 151/79 146/91  Pulse: 67 58 53 55  Temp: 98.6 F (37 C)   98.2 F (36.8 C)  TempSrc: Oral   Oral  Resp:  14 16 18 16   Height: 5\' 6"  (1.676 m)   5\' 6"  (1.676 m)  Weight: 54.885 kg (121 lb)   53.2 kg (117 lb 4.6 oz)  SpO2: 99% 97% 98% 100%    Wt Readings from Last 3 Encounters:  09/01/14 53.2 kg (117 lb 4.6 oz)  06/17/14 56.609 kg (124 lb 12.8 oz)  02/28/14 56.7 kg (125 lb)    General:  Appears calm and comfortable Eyes: PERRL, normal lids, irises & conjunctiva ENT: grossly normal hearing, lips & tongue Neck: no LAD, external mesh pouch in place Cardiovascular: regular rate, no m/r/g. No LE edema. Telemetry: SR, no arrhythmias  Respiratory: CTA bilaterally, no w/r/r. Normal respiratory effort. Abdomen: soft, TTP epigastric region, nd Skin: no rash or induration seen on limited exam Musculoskeletal: grossly normal tone BUE/BLE Psychiatric: grossly normal mood and affect, speech fluent and appropriate Neurologic: grossly non-focal.          Labs on Admission:  Basic Metabolic Panel:  Recent Labs Lab 08/31/14 2008  NA 143  K 4.0  CL 101  CO2 26  GLUCOSE 106*  BUN 10  CREATININE 0.70  CALCIUM 10.1   Liver Function Tests:  Recent Labs Lab 08/31/14 2255  AST 33  ALT 15  ALKPHOS 85  BILITOT 0.4  PROT 8.1  ALBUMIN 4.1    Recent Labs Lab 08/31/14 2008  LIPASE 55   No results for input(s): AMMONIA  in the last 168 hours. CBC:  Recent Labs Lab 08/31/14 2008  WBC 5.3  NEUTROABS 3.5  HGB 13.8  HCT 40.4  MCV 100.7*  PLT 140*   Cardiac Enzymes:  Recent Labs Lab 08/31/14 2255  TROPONINI <0.30    BNP (last 3 results) No results for input(s): PROBNP in the last 8760 hours. CBG: No results for input(s): GLUCAP in the last 168 hours.  Radiological Exams on Admission: Dg Chest 2 View  08/31/2014   CLINICAL DATA:  Acute onset of chest pain. Current history of smoking. Initial encounter.  EXAM: CHEST  2 VIEW  COMPARISON:  Chest radiograph performed 03/04/2014  FINDINGS: The lungs are well-aerated and clear. There is no evidence of focal opacification, pleural effusion or pneumothorax. Bilateral nipple shadows are noted.  The heart is normal in size; the mediastinal contour is within normal limits. No acute osseous abnormalities are seen. Chronic left-sided rib deformity is again noted. Clips are noted within the right upper quadrant, reflecting prior cholecystectomy.  IMPRESSION: No acute cardiopulmonary process seen.   Electronically Signed   By: Garald Balding M.D.   On: 08/31/2014 22:29   Ct Abdomen Pelvis W Contrast  09/01/2014   CLINICAL DATA:  Acute onset of upper abdominal pain, and left upper back pain. Personal history of pancreatitis. Initial encounter.  EXAM: CT ABDOMEN AND PELVIS WITH CONTRAST  TECHNIQUE: Multidetector CT imaging of the abdomen and pelvis was performed using the standard protocol following bolus administration of intravenous contrast.  CONTRAST:  112mL OMNIPAQUE IOHEXOL 300 MG/ML  SOLN  COMPARISON:  CT of the abdomen and pelvis from 02/28/2014  FINDINGS: Bibasilar atelectasis or scarring is noted.  The liver and spleen are unremarkable in appearance. The patient is status post cholecystectomy, with clips noted along the gallbladder fossa.  Calcification is noted at the pancreatic head and pancreatic tail, likely reflecting chronic sequelae of pancreatitis.  Slight haziness about the pancreatic head may be chronic in nature, given the lack of an elevated lipase. An apparent residual 2.1 cm pseudocyst is noted anterior to  the body of the pancreas, slightly decreased in size from the prior study. The adrenal glands are unremarkable in appearance.  An 8 mm cyst is noted at the lower pole of the right kidney. The kidneys are otherwise unremarkable in appearance. There is no evidence of hydronephrosis. No renal or ureteral stones are seen. No perinephric stranding is appreciated.  No free fluid is identified. The small bowel is unremarkable in appearance. The stomach is within normal limits. No acute vascular abnormalities are seen. Diffuse calcification is seen along the abdominal aorta and its branches, including at the origin of the left renal artery. Two right-sided renal arteries are seen.  The appendix is not definitely characterized; there is no evidence for appendicitis. The colon is unremarkable in appearance.  The bladder is mildly distended and grossly unremarkable. The prostate remains normal in size, with minimal calcification. No inguinal lymphadenopathy is seen.  No acute osseous abnormalities are identified.  IMPRESSION: 1. Slight haziness about the pancreatic head may be chronic in nature, given the lack of an elevated lipase. Apparent residual 2.1 cm pseudocyst seen anterior to the body of the pancreas, slightly decreased in size from the prior study. Calcification at the pancreatic head and tail likely reflect chronic sequelae of pancreatitis. 2. Bibasilar atelectasis or scarring noted. 3. Small right renal cyst seen. 4. Diffuse calcification along the abdominal aorta and its branches, including at the origin of the left renal artery.   Electronically Signed   By: Garald Balding M.D.   On: 09/01/2014 00:31    Assessment/Plan Principal Problem:   Chronic alcoholic pancreatitis Active Problems:   Hypothyroidism (acquired)   1. CHronic alcoholic  pancreatitis - lipase normal, but likely flare of known chronic pancreatitis.  Pt follows with pain clinic for pain control, but having difficulty with this right now.    - admit to obs, NPO for now  - d5-1/2NS fluids for now  - pain control  2. Hypothyroidism - s/p neck radiation  - continue home synthroid dose   Code Status: Full (must indicate code status--if unknown or must be presumed, indicate so) DVT Prophylaxis: Loveonx Family Communication: None Disposition Plan: admit to [facility] observation (indicate anticipated LOS)  Time spent: [place], [facility] Pager [phone]

## 2014-09-01 NOTE — Progress Notes (Signed)
UR completed 

## 2014-09-01 NOTE — ED Provider Notes (Signed)
Except care from Dr. Leonides Schanz. 58 year old male here with suspected pancreatitis. Awaiting CT scan.  CT noncontributory. The patient has gotten 3 doses of IV narcotic pain medicine. While he is tolerating by mouth and feeling better, he continues to have 6 out of 10 pain and prefers to be admitted as he does not believe he can get his pain controlled at home. Will admit to the hospitalist.  Pamella Pert, MD 09/01/14 [phone]

## 2014-09-02 DIAGNOSIS — K859 Acute pancreatitis without necrosis or infection, unspecified: Secondary | ICD-10-CM | POA: Diagnosis present

## 2014-09-02 DIAGNOSIS — E876 Hypokalemia: Secondary | ICD-10-CM | POA: Diagnosis present

## 2014-09-02 DIAGNOSIS — E039 Hypothyroidism, unspecified: Secondary | ICD-10-CM

## 2014-09-02 LAB — CBC
HEMATOCRIT: 36 % — AB (ref 39.0–52.0)
HEMOGLOBIN: 12.1 g/dL — AB (ref 13.0–17.0)
MCH: 34 pg (ref 26.0–34.0)
MCHC: 33.6 g/dL (ref 30.0–36.0)
MCV: 101.1 fL — ABNORMAL HIGH (ref 78.0–100.0)
Platelets: 125 10*3/uL — ABNORMAL LOW (ref 150–400)
RBC: 3.56 MIL/uL — AB (ref 4.22–5.81)
RDW: 12.7 % (ref 11.5–15.5)
WBC: 2.2 10*3/uL — AB (ref 4.0–10.5)

## 2014-09-02 LAB — COMPREHENSIVE METABOLIC PANEL
ALK PHOS: 76 U/L (ref 39–117)
ALT: 12 U/L (ref 0–53)
ANION GAP: 13 (ref 5–15)
AST: 21 U/L (ref 0–37)
Albumin: 3.4 g/dL — ABNORMAL LOW (ref 3.5–5.2)
BUN: 5 mg/dL — AB (ref 6–23)
CO2: 23 meq/L (ref 19–32)
Calcium: 8.9 mg/dL (ref 8.4–10.5)
Chloride: 100 mEq/L (ref 96–112)
Creatinine, Ser: 0.57 mg/dL (ref 0.50–1.35)
GFR calc Af Amer: >90 mL/min (ref >90)
Glucose, Bld: 116 mg/dL — ABNORMAL HIGH (ref 70–99)
Potassium: 3.3 mEq/L — ABNORMAL LOW (ref 3.7–5.3)
Sodium: 136 mEq/L — ABNORMAL LOW (ref 137–147)
TOTAL PROTEIN: 7 g/dL (ref 6.0–8.3)
Total Bilirubin: 0.4 mg/dL (ref 0.3–1.2)

## 2014-09-02 LAB — HIV ANTIBODY (ROUTINE TESTING W REFLEX): HIV: NONREACTIVE

## 2014-09-02 MED ORDER — BOOST / RESOURCE BREEZE PO LIQD: 1.0000 | Freq: Every day | ORAL | Status: DC | PRN

## 2014-09-02 MED ORDER — PANCRELIPASE (LIP-PROT-AMYL) 12000-38000 UNITS PO CPEP
12000.0000 [IU] | ORAL_CAPSULE | Freq: Four times a day (QID) | ORAL | Status: DC
  Administered 2014-09-02 – 2014-09-04 (×9): 12000 [IU] via ORAL
  Filled 2014-09-02 (×12): qty 1

## 2014-09-02 MED ORDER — POLYETHYLENE GLYCOL 3350 17 G PO PACK
17.0000 g | PACK | Freq: Every day | ORAL | Status: DC
  Administered 2014-09-02 – 2014-09-04 (×2): 17 g via ORAL
  Filled 2014-09-02 (×3): qty 1

## 2014-09-02 MED ORDER — MORPHINE SULFATE 2 MG/ML IJ SOLN
2.0000 mg | INTRAMUSCULAR | Status: DC | PRN
  Administered 2014-09-02: 4 mg via INTRAVENOUS
  Filled 2014-09-02: qty 2

## 2014-09-02 MED ORDER — POTASSIUM CHLORIDE CRYS ER 20 MEQ PO TBCR
40.0000 meq | EXTENDED_RELEASE_TABLET | Freq: Two times a day (BID) | ORAL | Status: AC
  Administered 2014-09-02 (×2): 40 meq via ORAL
  Filled 2014-09-02 (×2): qty 2

## 2014-09-02 MED ORDER — HYDROMORPHONE HCL 2 MG/ML IJ SOLN
1.0000 mg | INTRAMUSCULAR | Status: DC | PRN
  Administered 2014-09-02 – 2014-09-04 (×10): 2 mg via INTRAVENOUS
  Filled 2014-09-02 (×10): qty 1

## 2014-09-02 NOTE — Progress Notes (Signed)
INITIAL NUTRITION ASSESSMENT  DOCUMENTATION CODES Per approved criteria  -Non-severe (moderate) malnutrition in the context of chronic illness  Pt meets criteria for moderate MALNUTRITION in the context of chronic illness as evidenced by 6% weight loss x 2 months, energy intake <75% for >1 month and moderate muscle depletion.  INTERVENTION: -Diet advancement per MD -Provide Resource Breeze po daily PRN, each supplement provides 250 kcal and 9 grams of protein -RD to follow-up for TF needs & diet tolerance  NUTRITION DIAGNOSIS: Inadequate oral intake related to epigastric pain as evidenced by PO intake <25%.   Goal: Pt to meet >/= 90% of their estimated nutrition needs   Monitor:  PO and supplemental intake, diet tolerance, Home TF needs, weight, labs, I/O's  Reason for Assessment: Pt identified as at nutrition risk on the Malnutrition Screen Tool  Admitting Dx: Chronic alcoholic pancreatitis  ASSESSMENT: 58 y.o. Williamson with chronic alcoholic pancreatitis on replacement.Has had nausea without vomiting for past 24 hours when thsi flare up started.   Pt reports around 5-10 lb weight loss, UBW of 125-127 lb (6% wt loss x 2 months, this is significant for time frame).  PTA pt states his appetite "comes and goes".  Pt has a G-tube placed, states at home he uses 4 cans of Osmolite 1.2 in night feeds when his appetite is low, also reports drinking around 3 Ensures daily.  Pt is on clear liquids currently. RD spoke with MD, pt is not scheduled to resume night feeds until he is better tolerating clears. Pt did report some pain after eating broth, states he felt "burning". RD to follow-up with MD for plan.  Pt is interested in trying Resource Breeze supplements while on clear liquids. RD to order PRN.  Nutrition Focused Physical Exam:  Subcutaneous Fat:  Orbital Region: WNL Upper Arm Region: WNL Thoracic and Lumbar Region: NA  Muscle:  Temple Region: mild depletion Clavicle Bone  Region: moderate depletion Clavicle and Acromion Bone Region: moderate depletion Scapular Bone Region: WNL Dorsal Hand: WNL Patellar Region: NA Anterior Thigh Region: NA Posterior Calf Region: NA  Edema: no LE edema   Labs reviewed: Low Na, K & BUN Glucose 116  Height: Ht Readings from Last 1 Encounters:  09/01/14 5\' 6"  (1.676 m)    Weight: Wt Readings from Last 1 Encounters:  09/01/14 117 lb 4.6 oz (53.2 kg)    Ideal Body Weight: 142 lb  % Ideal Body Weight: 82%  Wt Readings from Last 10 Encounters:  09/01/14 117 lb 4.6 oz (53.2 kg)  06/17/14 124 lb 12.8 oz (56.609 kg)  02/28/14 125 lb (56.7 kg)  02/01/14 122 lb 3.2 oz (55.43 kg)  01/18/14 128 lb 3.2 oz (58.151 kg)  11/02/13 130 lb 3.2 oz (59.058 kg)  04/19/13 128 lb 12.8 oz (58.423 kg)  03/23/13 127 lb 8 oz (57.834 kg)  03/01/13 126 lb 14.4 oz (57.561 kg)  09/21/12 123 lb 3.2 oz (55.883 kg)    Usual Body Weight: 125-127 lb -per pt  % Usual Body Weight: 94%  BMI:  Body mass index is 18.94 kg/(m^2).  Estimated Nutritional Needs: Kcal: 1600-1800 Protein: 75-85g Fluid: 1.6L/day  Skin: intact  Diet Order: Diet clear liquid  EDUCATION NEEDS: -No education needs identified at this time   Intake/Output Summary (Last 24 hours) at 09/02/14 1215 Last data filed at 09/02/14 0800  Gross per 24 hour  Intake      0 ml  Output   2150 ml  Net  -2150 ml  Last BM: 11/27  Labs:   Recent Labs Lab 08/31/14 2008 09/01/14 0544 09/02/14 0445  NA 143 140 136*  K 4.0 3.7 3.3*  CL 101 101 100  CO2 26 26 23   BUN 10 8 5*  CREATININE 0.70 0.69 0.57  CALCIUM 10.1 9.5 8.9  GLUCOSE 106* 88 116*    CBG (last 3)  No results for input(s): GLUCAP in the last 72 hours.  Scheduled Meds: . antiseptic oral rinse  7 mL Mouth Rinse q12n4p  . chlorhexidine  15 mL Mouth Rinse BID  . Chlorhexidine Gluconate Cloth  6 each Topical Q0600  . enoxaparin (LOVENOX) injection  40 mg Subcutaneous Q24H  . fentaNYL  50 mcg  Transdermal Q72H  . hydrOXYzine  25 mg Oral QHS  . levothyroxine  25 mcg Oral QAC breakfast  . lipase/protease/amylase  12,000 Units Oral QID  . metoprolol  50 mg Oral BID  . mupirocin ointment  1 application Nasal BID  . pantoprazole (PROTONIX) IV  40 mg Intravenous Q12H  . potassium chloride  40 mEq Oral BID  . sodium chloride  3 mL Intravenous Q12H    Continuous Infusions: . dextrose 5 % and 0.45% NaCl 100 mL/hr at 09/02/14 8466    Past Medical History  Diagnosis Date  . Hypertension   . Chronic pancreatitis   . Arthritis   . Hx of radiation therapy 02/02/11 to 03/22/11    L tonsil  . Seizures     alcohol-related  . Cervical adenopathy 04/26/2012  . Urinary hesitancy   . Constipation   . Shortness of breath 05/23/2012    "lying down"  . History of blood transfusion ~ 2004    "from the pancreatitis"  . Daily headache 05/23/2012    "small ones"  . History of radiation therapy 02/02/2011-03/22/2011    head/neck,left tonsil  . G tube feedings     has a feeding tube in stomach  . Hypothyroidism 10/29/2013  . Thrush 11/02/2013  . Cancer 12/15/10    left tonsillar squamous cell carcinoma  . Tonsillar cancer     tonsillar ca dx 12/27/10  . 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 Bile duct stent placement      hx of  . Radical neck dissection  05/23/2012    w/mass excision  . Cholecystectomy  04/2005  . Tibia fracture surgery  1990's    left  . Fracture surgery    . Direct laryngoscopy  05/23/2012    Procedure: DIRECT LARYNGOSCOPY;  Surgeon: Rozetta Nunnery, MD;  Location: [place];  Service: ENT;  Laterality: N/A;  . Mass biopsy  05/23/2012    Procedure: NECK MASS BIOPSY;  Surgeon: Rozetta Nunnery, MD;  Location: [place];  Service: ENT;  Laterality: N/A;  . Radical neck dissection  05/23/2012    Procedure: RADICAL NECK DISSECTION;  Surgeon: Rozetta Nunnery, MD;  Location: [place];  Service: ENT;  Laterality:  Left;  . Direct laryngoscopy N/A 03/23/2013    Procedure: DIRECT LARYNGOSCOPY;  Surgeon: Rozetta Nunnery, MD;  Location: [place];  Service: ENT;  Laterality: N/A;  . Esophageal dilation N/A 03/23/2013    Procedure: ESOPHAGEAL DILATION;  Surgeon: Rozetta Nunnery, MD;  Location: [place];  Service: ENT;  Laterality: N/A;    Clayton Bibles, MS, RD, LDN Pager: [phone] After Hours Pager: [phone]

## 2014-09-02 NOTE — Progress Notes (Signed)
[facility] PROGRESS NOTE  Javonn Gauger LKG:[id number] DOB: 09-06-1956 DOA: 08/31/2014 PCP: Harvie Junior, MD  Assessment/Plan: 1-Acute on Chronic Pancreatitis;  Ct abdomen: Slight haziness i the pancreas might be chronic, residual 2.1 cm pseudocyst slight decrease in size since prior study.  Continue with support care, IV fluids, antiemetics.  Change pain medication again to dilaudid to 1-2 mg IV PRN. He is not tolerating morphine, itching., tingling.   IV phenergan.  Resume chronic pain meds.  Start Clear diet.   2-Hypothyroidism; S/P neck radiation. Continue with synthroid.   3-HTN; continue with metoprolol.   4-Chronic Leukopenia, thrombocytopenia:  HIV negative. Follow with Dr Alvy Bimler, unclear etiology of pancytopenia.   5-Hypokalemia; replete with oral supplement.   Code Status: Full Code.  Family Communication: Care discussed with patient.  Disposition Plan: Remain inpatient.    Consultants:  none  Procedures:  none  Antibiotics:  None.   HPI/Subjective: Still complaining of pain, 7/10, wants to try clear diet.  Morphine not helping, causing itching and tingling.     Objective: Filed Vitals:   09/02/14 1445  BP: 177/76  Pulse: 50  Temp: 98.4 F (36.9 C)  Resp: 16    Intake/Output Summary (Last 24 hours) at 09/02/14 1548 Last data filed at 09/02/14 0800  Gross per 24 hour  Intake      0 ml  Output   1450 ml  Net  -1450 ml   Filed Weights   08/31/14 1953 09/01/14 0355  Weight: 54.885 kg (121 lb) 53.2 kg (117 lb 4.6 oz)    Exam:   General:  Alert in no distress.   Cardiovascular: S 1, S 2 RRR  Respiratory: CTA  Abdomen: BS present, soft, mild tenderness.   Musculoskeletal: no edema.   Data Reviewed: Basic Metabolic Panel:  Recent Labs Lab 08/31/14 2008 09/01/14 0544 09/02/14 0445  NA 143 140 136*  K 4.0 3.7 3.3*  CL 101 101 100  CO2 26 26 23   GLUCOSE 106* 88 116*  BUN 10 8 5*  CREATININE 0.70 0.69 0.57   CALCIUM 10.1 9.5 8.9   Liver Function Tests:  Recent Labs Lab 08/31/14 2255 09/02/14 0445  AST 33 21  ALT 15 12  ALKPHOS 85 76  BILITOT 0.4 0.4  PROT 8.1 7.0  ALBUMIN 4.1 3.4*    Recent Labs Lab 08/31/14 2008  LIPASE 55   No results for input(s): AMMONIA in the last 168 hours. CBC:  Recent Labs Lab 08/31/14 2008 09/01/14 0544 09/02/14 0445  WBC 5.3 3.2* 2.2*  NEUTROABS 3.5  --   --   HGB 13.8 12.9* 12.1*  HCT 40.4 37.7* 36.0*  MCV 100.7* 100.8* 101.1*  PLT 140* 130* 125*   Cardiac Enzymes:  Recent Labs Lab 08/31/14 2255  TROPONINI <0.30   BNP (last 3 results) No results for input(s): PROBNP in the last 8760 hours. CBG: No results for input(s): GLUCAP in the last 168 hours.  Recent Results (from the past 240 hour(s))  MRSA PCR Screening     Status: Abnormal   Collection Time: 09/01/14  3:53 AM  Result Value Ref Range Status   MRSA by PCR POSITIVE (A) NEGATIVE Final    Comment:        The GeneXpert MRSA Assay (FDA approved for NASAL specimens only), is one component of a comprehensive MRSA colonization surveillance program. It is not intended to diagnose MRSA infection nor to guide or monitor treatment for MRSA infections. RESULT CALLED TO, READ BACK BY AND VERIFIED  WITHLaury Axon [id number] @ 0523 BY J SCOTTON      Studies: Dg Chest 2 View  08/31/2014   CLINICAL DATA:  Acute onset of chest pain. Current history of smoking. Initial encounter.  EXAM: CHEST  2 VIEW  COMPARISON:  Chest radiograph performed 03/04/2014  FINDINGS: The lungs are well-aerated and clear. There is no evidence of focal opacification, pleural effusion or pneumothorax. Bilateral nipple shadows are noted.  The heart is normal in size; the mediastinal contour is within normal limits. No acute osseous abnormalities are seen. Chronic left-sided rib deformity is again noted. Clips are noted within the right upper quadrant, reflecting prior cholecystectomy.  IMPRESSION: No acute  cardiopulmonary process seen.   Electronically Signed   By: Garald Balding M.D.   On: 08/31/2014 22:29   Ct Abdomen Pelvis W Contrast  09/01/2014   CLINICAL DATA:  Acute onset of upper abdominal pain, and left upper back pain. Personal history of pancreatitis. Initial encounter.  EXAM: CT ABDOMEN AND PELVIS WITH CONTRAST  TECHNIQUE: Multidetector CT imaging of the abdomen and pelvis was performed using the standard protocol following bolus administration of intravenous contrast.  CONTRAST:  177mL OMNIPAQUE IOHEXOL 300 MG/ML  SOLN  COMPARISON:  CT of the abdomen and pelvis from 02/28/2014  FINDINGS: Bibasilar atelectasis or scarring is noted.  The liver and spleen are unremarkable in appearance. The patient is status post cholecystectomy, with clips noted along the gallbladder fossa.  Calcification is noted at the pancreatic head and pancreatic tail, likely reflecting chronic sequelae of pancreatitis. Slight haziness about the pancreatic head may be chronic in nature, given the lack of an elevated lipase. An apparent residual 2.1 cm pseudocyst is noted anterior to the body of the pancreas, slightly decreased in size from the prior study. The adrenal glands are unremarkable in appearance.  An 8 mm cyst is noted at the lower pole of the right kidney. The kidneys are otherwise unremarkable in appearance. There is no evidence of hydronephrosis. No renal or ureteral stones are seen. No perinephric stranding is appreciated.  No free fluid is identified. The small bowel is unremarkable in appearance. The stomach is within normal limits. No acute vascular abnormalities are seen. Diffuse calcification is seen along the abdominal aorta and its branches, including at the origin of the left renal artery. Two right-sided renal arteries are seen.  The appendix is not definitely characterized; there is no evidence for appendicitis. The colon is unremarkable in appearance.  The bladder is mildly distended and grossly  unremarkable. The prostate remains normal in size, with minimal calcification. No inguinal lymphadenopathy is seen.  No acute osseous abnormalities are identified.  IMPRESSION: 1. Slight haziness about the pancreatic head may be chronic in nature, given the lack of an elevated lipase. Apparent residual 2.1 cm pseudocyst seen anterior to the body of the pancreas, slightly decreased in size from the prior study. Calcification at the pancreatic head and tail likely reflect chronic sequelae of pancreatitis. 2. Bibasilar atelectasis or scarring noted. 3. Small right renal cyst seen. 4. Diffuse calcification along the abdominal aorta and its branches, including at the origin of the left renal artery.   Electronically Signed   By: Garald Balding M.D.   On: 09/01/2014 00:31    Scheduled Meds: . antiseptic oral rinse  7 mL Mouth Rinse q12n4p  . chlorhexidine  15 mL Mouth Rinse BID  . Chlorhexidine Gluconate Cloth  6 each Topical Q0600  . enoxaparin (LOVENOX) injection  40 mg Subcutaneous Q24H  .  fentaNYL  50 mcg Transdermal Q72H  . hydrOXYzine  25 mg Oral QHS  . levothyroxine  25 mcg Oral QAC breakfast  . lipase/protease/amylase  12,000 Units Oral QID  . metoprolol  50 mg Oral BID  . mupirocin ointment  1 application Nasal BID  . pantoprazole (PROTONIX) IV  40 mg Intravenous Q12H  . potassium chloride  40 mEq Oral BID  . sodium chloride  3 mL Intravenous Q12H   Continuous Infusions: . dextrose 5 % and 0.45% NaCl 100 mL/hr at 09/02/14 0026    Principal Problem:   Chronic alcoholic pancreatitis Active Problems:   Hypothyroidism (acquired)    Time spent: 35 minutes.     Niel Hummer A  [facility] Pager [phone]. If 7PM-7AM, please contact night-coverage at [email or web address], password [facility] 09/02/2014, 3:48 PM  LOS: 2 days

## 2014-09-03 DIAGNOSIS — E440 Moderate protein-calorie malnutrition: Secondary | ICD-10-CM

## 2014-09-03 DIAGNOSIS — K852 Alcohol induced acute pancreatitis: Principal | ICD-10-CM

## 2014-09-03 LAB — CBC WITH DIFFERENTIAL/PLATELET
BASOS ABS: 0 10*3/uL (ref 0.0–0.1)
BASOS PCT: 0 % (ref 0–1)
EOS ABS: 0.1 10*3/uL (ref 0.0–0.7)
Eosinophils Relative: 4 % (ref 0–5)
HCT: 37.3 % — ABNORMAL LOW (ref 39.0–52.0)
HEMOGLOBIN: 12.5 g/dL — AB (ref 13.0–17.0)
Lymphocytes Relative: 28 % (ref 12–46)
Lymphs Abs: 0.9 10*3/uL (ref 0.7–4.0)
MCH: 33.5 pg (ref 26.0–34.0)
MCHC: 33.5 g/dL (ref 30.0–36.0)
MCV: 100 fL (ref 78.0–100.0)
MONOS PCT: 23 % — AB (ref 3–12)
Monocytes Absolute: 0.7 10*3/uL (ref 0.1–1.0)
NEUTROS ABS: 1.5 10*3/uL — AB (ref 1.7–7.7)
NEUTROS PCT: 45 % (ref 43–77)
PLATELETS: 136 10*3/uL — AB (ref 150–400)
RBC: 3.73 MIL/uL — ABNORMAL LOW (ref 4.22–5.81)
RDW: 12.7 % (ref 11.5–15.5)
WBC: 3.3 10*3/uL — ABNORMAL LOW (ref 4.0–10.5)

## 2014-09-03 LAB — BASIC METABOLIC PANEL
Anion gap: 13 (ref 5–15)
BUN: 3 mg/dL — ABNORMAL LOW (ref 6–23)
CO2: 23 mEq/L (ref 19–32)
Calcium: 9.3 mg/dL (ref 8.4–10.5)
Chloride: 105 mEq/L (ref 96–112)
Creatinine, Ser: 0.61 mg/dL (ref 0.50–1.35)
Glucose, Bld: 117 mg/dL — ABNORMAL HIGH (ref 70–99)
POTASSIUM: 4.2 meq/L (ref 3.7–5.3)
Sodium: 141 mEq/L (ref 137–147)

## 2014-09-03 LAB — GLUCOSE, CAPILLARY: GLUCOSE-CAPILLARY: 117 mg/dL — AB (ref 70–99)

## 2014-09-03 MED ORDER — OSMOLITE 1.5 CAL PO LIQD: 237.0000 mL | Freq: Every day | ORAL | Status: DC

## 2014-09-03 MED ORDER — JEVITY 1.2 CAL PO LIQD: 1000.0000 mL | ORAL | Status: DC

## 2014-09-03 MED ORDER — OSMOLITE 1.5 CAL PO LIQD
237.0000 mL | ORAL | Status: DC
  Administered 2014-09-03: 237 mL
  Filled 2014-09-03: qty 237

## 2014-09-03 NOTE — Progress Notes (Addendum)
NUTRITION FOLLOW-UP  INTERVENTION: -Initiate Home TF regimen of Osmolite 1.5 at 120 ml overnight, advance to goal rate of 237 ml 4 times daily as tolerated.  -Tube feeding regimen provides 1420 kcal (89% of needs), 60 grams of protein, and 724 ml of H2O.  -Patient is able to take fluids PO. Pt needs additional 900 ml free water per day.  -Diet advancement per MD -Provide Resource Breeze po daily PRN, each supplement provides 250 kcal and 9 grams of protein -RD to continue to monitor  NUTRITION DIAGNOSIS: Inadequate oral intake related to epigastric pain as evidenced by PO intake <25%, improved.  Goal: Pt to meet >/= 90% of their estimated nutrition needs, unmet  Monitor:  TF initiation and tolerance, PO and supplemental intake, diet tolerance, weight, labs, I/O's  ASSESSMENT: 58 y.o. male with chronic alcoholic pancreatitis on replacement.Has had nausea without vomiting for past 24 hours when thsi flare up started.   RD consulted for TF initiation and management. PO intake: 50%, improved.  Pt reports Home TF regimen of 4 cans of Osmolite 1.5 overnight which provides 1420 kcal, 60 g protein and 724 ml of free water. Pt prefers night feeds (8pm-10am) over daily continuous feeds. At home pt meets the rest of his needs by consuming around 3 Ensure supplements daily. RD to order Ensure if diet is advanced past clears.  Pt continues to state that he is experiencing some pain while eating and is feeling "burning in his chest".  Labs reviewed: Low BUN Glucose 117  Height: Ht Readings from Last 1 Encounters:  09/01/14 5\' 6"  (1.676 m)    Weight: Wt Readings from Last 1 Encounters:  09/01/14 117 lb 4.6 oz (53.2 kg)    BMI:  Body mass index is 18.94 kg/(m^2).  Estimated Nutritional Needs: Kcal: 1600-1800 Protein: 75-85g Fluid: 1.6L/day  Skin: intact  Diet Order: Diet clear liquid  EDUCATION NEEDS: -No education needs identified at this time   Intake/Output Summary  (Last 24 hours) at 09/03/14 1424 Last data filed at 09/03/14 1200  Gross per 24 hour  Intake    123 ml  Output   2725 ml  Net  -2602 ml    Last BM: 11/27  Labs:   Recent Labs Lab 09/01/14 0544 09/02/14 0445 09/03/14 0442  NA 140 136* 141  K 3.7 3.3* 4.2  CL 101 100 105  CO2 26 23 23   BUN 8 5* 3*  CREATININE 0.69 0.57 0.61  CALCIUM 9.5 8.9 9.3  GLUCOSE 88 116* 117*    CBG (last 3)  No results for input(s): GLUCAP in the last 72 hours.  Scheduled Meds: . antiseptic oral rinse  7 mL Mouth Rinse q12n4p  . chlorhexidine  15 mL Mouth Rinse BID  . Chlorhexidine Gluconate Cloth  6 each Topical Q0600  . enoxaparin (LOVENOX) injection  40 mg Subcutaneous Q24H  . fentaNYL  50 mcg Transdermal Q72H  . hydrOXYzine  25 mg Oral QHS  . levothyroxine  25 mcg Oral QAC breakfast  . lipase/protease/amylase  12,000 Units Oral QID  . metoprolol  50 mg Oral BID  . mupirocin ointment  1 application Nasal BID  . pantoprazole (PROTONIX) IV  40 mg Intravenous Q12H  . polyethylene glycol  17 g Oral Daily  . sodium chloride  3 mL Intravenous Q12H    Continuous Infusions: . dextrose 5 % and 0.45% NaCl 100 mL/hr at 09/03/14 6789     Clayton Bibles, MS, RD, LDN Pager: [phone] After Hours Pager: [phone]

## 2014-09-03 NOTE — Clinical Documentation Improvement (Signed)
09/02/14 Nutrition eval..Marland Kitchen"Pt meets criteria for moderate MALNUTRITION in the context of chronic illness as evidenced by 6% weight loss x 2 months, energy intake <75% for >1 month and moderate muscle depletion.".Marland KitchenMarland Kitchen"INTERVENTION:-Diet advancement per MD-Provide Resource Breeze po daily PRN, each supplement provides 250 kcal and 9 grams of protein-RD to follow-up for TF needs & diet tolerance."..."Goal: Pt to meet >/= 90% of their estimated nutrition needs.  Monitor: PO and supplemental intake, diet tolerance, Home TF needs, weight, labs, I/O's."  For accurate Dx specificity & severity can noted nutrition eval be validated for cond being mon'd, eval'd & tx'd.  Thank you . Severity: --Mild (first degree) malnutrition --Moderate (second degree) malnutrition --Severe (third degree) malnutrition --Other . Document any associated diagnoses/conditions  Supporting Information: see above note  Thank You, Ermelinda Das, RN, BSN, CCDS Certified Clinical Documentation Specialist Pager: [place]: Health Information Management

## 2014-09-03 NOTE — Progress Notes (Signed)
[facility] PROGRESS NOTE  Russell Williamson:[id number] DOB: Sep 23, 1956 DOA: 08/31/2014 PCP: Harvie Junior, MD  Assessment/Plan: 58 year old with PMH significant ofr chronic pancreatitis, chronic pancytopenia, history of squamous cell carcinoma of tonsil follow with Dr Alvy Bimler, who presents with worsening abdominal pain. CT abdomen showed slight haziness in the pancreas might be chronic, residual pseudocyst. He has been getting treatment for acute on chronic pancreatitis.   1-Acute on Chronic Pancreatitis;  Ct abdomen: Slight haziness in the pancreas might be chronic, residual 2.1 cm pseudocyst slight decrease in size since prior study.  Continue with support care, IV fluids, antiemetics.  Change pain medication again to dilaudid to 1-2 mg IV PRN. He is not tolerating morphine, itching., tingling.   IV phenergan.  Continue with  chronic pain meds.  Tolerating clear diet. Will resume today enteral nutrition.   2-Hypothyroidism; S/P neck radiation. Continue with synthroid.   3-HTN; continue with metoprolol.   4-Chronic Leukopenia, thrombocytopenia:  HIV negative. Follow with Dr Alvy Bimler, unclear etiology of pancytopenia.   5-Hypokalemia; replete with oral supplement.   Code Status: Full Code.  Family Communication: Care discussed with patient.  Disposition Plan: Remain inpatient.    Consultants:  none  Procedures:  none  Antibiotics:  None.   HPI/Subjective: Pain some what improved. Tolerating clear diet. He wants to go home tomorrow.  AT home he only drinks fluids.    Objective: Filed Vitals:   09/03/14 0921  BP:   Pulse: 62  Temp:   Resp:     Intake/Output Summary (Last 24 hours) at 09/03/14 1313 Last data filed at 09/03/14 1200  Gross per 24 hour  Intake    123 ml  Output   2725 ml  Net  -2602 ml   Filed Weights   08/31/14 1953 09/01/14 0355  Weight: 54.885 kg (121 lb) 53.2 kg (117 lb 4.6 oz)    Exam:   General:  Alert in no  distress.   Cardiovascular: S 1, S 2 RRR  Respiratory: CTA  Abdomen: BS present, soft, mild tenderness.   Musculoskeletal: no edema.   Data Reviewed: Basic Metabolic Panel:  Recent Labs Lab 08/31/14 2008 09/01/14 0544 09/02/14 0445 09/03/14 0442  NA 143 140 136* 141  K 4.0 3.7 3.3* 4.2  CL 101 101 100 105  CO2 26 26 23 23   GLUCOSE 106* 88 116* 117*  BUN 10 8 5* 3*  CREATININE 0.70 0.69 0.57 0.61  CALCIUM 10.1 9.5 8.9 9.3   Liver Function Tests:  Recent Labs Lab 08/31/14 2255 09/02/14 0445  AST 33 21  ALT 15 12  ALKPHOS 85 76  BILITOT 0.4 0.4  PROT 8.1 7.0  ALBUMIN 4.1 3.4*    Recent Labs Lab 08/31/14 2008  LIPASE 55   No results for input(s): AMMONIA in the last 168 hours. CBC:  Recent Labs Lab 08/31/14 2008 09/01/14 0544 09/02/14 0445 09/03/14 0442  WBC 5.3 3.2* 2.2* 3.3*  NEUTROABS 3.5  --   --  1.5*  HGB 13.8 12.9* 12.1* 12.5*  HCT 40.4 37.7* 36.0* 37.3*  MCV 100.7* 100.8* 101.1* 100.0  PLT 140* 130* 125* 136*   Cardiac Enzymes:  Recent Labs Lab 08/31/14 2255  TROPONINI <0.30   BNP (last 3 results) No results for input(s): PROBNP in the last 8760 hours. CBG: No results for input(s): GLUCAP in the last 168 hours.  Recent Results (from the past 240 hour(s))  MRSA PCR Screening     Status: Abnormal   Collection Time: 09/01/14  3:53 AM  Result Value Ref Range Status   MRSA by PCR POSITIVE (A) NEGATIVE Final    Comment:        The GeneXpert MRSA Assay (FDA approved for NASAL specimens only), is one component of a comprehensive MRSA colonization surveillance program. It is not intended to diagnose MRSA infection nor to guide or monitor treatment for MRSA infections. RESULT CALLED TO, READ BACK BY AND VERIFIED WITH: Laury Axon [id number] @ [phone] BY J SCOTTON      Studies: No results found.  Scheduled Meds: . antiseptic oral rinse  7 mL Mouth Rinse q12n4p  . chlorhexidine  15 mL Mouth Rinse BID  . Chlorhexidine Gluconate Cloth   6 each Topical Q0600  . enoxaparin (LOVENOX) injection  40 mg Subcutaneous Q24H  . fentaNYL  50 mcg Transdermal Q72H  . hydrOXYzine  25 mg Oral QHS  . levothyroxine  25 mcg Oral QAC breakfast  . lipase/protease/amylase  12,000 Units Oral QID  . metoprolol  50 mg Oral BID  . mupirocin ointment  1 application Nasal BID  . pantoprazole (PROTONIX) IV  40 mg Intravenous Q12H  . polyethylene glycol  17 g Oral Daily  . sodium chloride  3 mL Intravenous Q12H   Continuous Infusions: . dextrose 5 % and 0.45% NaCl 100 mL/hr at 09/03/14 5830    Principal Problem:   Chronic alcoholic pancreatitis Active Problems:   Hypothyroidism (acquired)   Pancytopenia   Acute pancreatitis   Hypokalemia   Malnutrition of moderate degree    Time spent: 30 minutes.     Niel Hummer A  [facility] Pager [phone]. If 7PM-7AM, please contact night-coverage at [email or web address], password [facility] 09/03/2014, 1:13 PM  LOS: 3 days

## 2014-09-03 NOTE — Progress Notes (Signed)
CBG 117, Pt ID bracelet reading "not valid patient"

## 2014-09-03 NOTE — Care Management Note (Signed)
CARE MANAGEMENT NOTE 09/03/2014  Patient:  Russell Williamson, Russell Williamson   Account Number:  [id number]  Date Initiated:  09/03/2014  Documentation initiated by:  Marney Doctor  Subjective/Objective Assessment:   58 yo admitted with chronic alcoholic pancreatitis     Action/Plan:   From home with sister   Anticipated DC Date:  09/06/2014   Anticipated DC Plan:  [place]  CM consult      Choice offered to / List presented to:             Status of service:  In process, will continue to follow Medicare Important Message given?   (If response is "NO", the following Medicare IM given date fields will be blank) Date Medicare IM given:   Medicare IM given by:   Date Additional Medicare IM given:   Additional Medicare IM given by:    Discharge Disposition:    Per UR Regulation:  Reviewed for med. necessity/level of care/duration of stay  If discussed at [place] of Stay Meetings, dates discussed:    Comments:  09/03/14 Marney Doctor RN,BSN,NCM [phone] Chart reviewed and CM following for DC needs.

## 2014-09-03 NOTE — Plan of Care (Signed)
Problem: Phase I Progression Outcomes Goal: Pain controlled with appropriate interventions Outcome: Progressing     

## 2014-09-04 ENCOUNTER — Encounter (HOSPITAL_COMMUNITY): Payer: Self-pay | Admitting: Internal Medicine

## 2014-09-04 DIAGNOSIS — R197 Diarrhea, unspecified: Secondary | ICD-10-CM | POA: Diagnosis not present

## 2014-09-04 DIAGNOSIS — E876 Hypokalemia: Secondary | ICD-10-CM

## 2014-09-04 DIAGNOSIS — Z22322 Carrier or suspected carrier of Methicillin resistant Staphylococcus aureus: Secondary | ICD-10-CM

## 2014-09-04 DIAGNOSIS — D61818 Other pancytopenia: Secondary | ICD-10-CM

## 2014-09-04 HISTORY — DX: Carrier or suspected carrier of methicillin resistant Staphylococcus aureus: Z22.322

## 2014-09-04 LAB — GLUCOSE, CAPILLARY
Glucose-Capillary: 123 mg/dL — ABNORMAL HIGH (ref 70–99)
Glucose-Capillary: 122 mg/dL — ABNORMAL HIGH (ref 70–99)
Glucose-Capillary: 93 mg/dL (ref 70–99)

## 2014-09-04 LAB — CLOSTRIDIUM DIFFICILE BY PCR: CDIFFPCR: POSITIVE — AB

## 2014-09-04 MED ORDER — OSMOLITE 1.5 CAL PO LIQD
237.0000 mL | Freq: Four times a day (QID) | ORAL | Status: DC
  Filled 2014-09-04 (×3): qty 237

## 2014-09-04 MED ORDER — ENSURE COMPLETE PO LIQD
237.0000 mL | Freq: Two times a day (BID) | ORAL | Status: DC
  Administered 2014-09-04: 237 mL via ORAL

## 2014-09-04 MED ORDER — ENSURE PUDDING PO PUDG
1.0000 | Freq: Three times a day (TID) | ORAL | Status: DC
  Filled 2014-09-04 (×2): qty 1

## 2014-09-04 NOTE — Discharge Summary (Signed)
Physician Discharge Summary  Russell Williamson OZH:[id number] DOB: 1956-02-17 DOA: 08/31/2014  PCP: Harvie Junior, MD  Admit date: 08/31/2014 Discharge date: 09/04/2014   Recommendations for Outpatient Follow-Up:   1. F/U PCP in 2 weeks or sooner if needed (diarrhea does not resolve).   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  Diet recommendation: Low sodium, heart healthy.     History of Present Illness:   Patient is a 58 year old man with a PMH of chronic pancreatitis, chronic pancytopenia, history of squamous cell carcinoma of the tonsils followed by Dr. Alvy Bimler who was admitted on 08/31/14 with worsening abdominal pain. CT scan done on admission showed possible pancreatic inflammation. He also had a residual pancreatic pseudocyst.   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 Calcifications noted in the pancreatic head and tail.  Treated conservatively with bowel rest, IV pain medications and IV antinausea medications.  Enteral nutrition resumed 09/03/14 with good tolerance, other than some diarrhea.  Active Problems:   Squamous cell carcinoma of the tonsil  Status post concurrent chemoradiation 01/2011-03/2011 with further chemotherapy discontinued secondary to prolonged pancytopenia.  Most recent imaging studies show no evidence of disease progression with recommendations to have follow-up imaging studies  10/2014.    Hypothyroidism (acquired)  Status post neck radiation, managed with Synthroid.    Pancytopenia  Unclear etiology but may be secondary to chronic inflammation.    Hypokalemia  Potassium WNL at discharge.    Malnutrition of moderate degree / gastrostomy tube in place  Enteral feeding resumed 09/03/14. Continue Ensure supplements.  Seen by dietitian.  Has a gastrostomy tube in place for enteral feeding.    Diarrhea  Patient declines further inpatient evaluation of his diarrhea and is requesting discharge.  Instructed to call his PCP if his diarrhea does not resolve in a few days.     Medical Consultants:    None.   Discharge Exam:   Filed Vitals:   09/04/14 0953  BP: 132/80  Pulse:   Temp:   Resp:    Filed Vitals:   09/03/14 1500 09/03/14 2007 09/04/14 0424 09/04/14 0953  BP: 170/91 154/88 128/72 132/80  Pulse: 54 55 55   Temp: 98.5 F (36.9 C) 98.4 F (36.9 C) 98.5 F (36.9 C)   TempSrc: Oral Oral Oral   Resp: 15 16 16    Height:      Weight:   54.8 kg (120 lb 13 oz)   SpO2: 100% 100% 100%     Gen:  NAD, thin Cardiovascular:  RRR, No M/R/G Respiratory: Lungs CTAB Gastrointestinal: Abdomen soft, NT/ND with normal active bowel sounds. Extremities: No C/E/C   The results of significant diagnostics from this hospitalization (including imaging, microbiology, ancillary and laboratory) are listed below for reference.     Procedures and Diagnostic Studies:   Dg Chest 2 View  08/31/2014   CLINICAL DATA:  Acute onset of chest pain. Current history of smoking. Initial  encounter.  EXAM: CHEST  2 VIEW  COMPARISON:  Chest radiograph performed 03/04/2014  FINDINGS: The lungs are well-aerated and clear. There is no evidence of focal opacification, pleural effusion or pneumothorax. Bilateral nipple shadows are noted.  The heart is normal in size; the mediastinal contour is within normal limits. No acute osseous abnormalities are seen. Chronic  left-sided rib deformity is again noted. Clips are noted within the right upper quadrant, reflecting prior cholecystectomy.  IMPRESSION: No acute cardiopulmonary process seen.   Electronically Signed   By: Garald Balding M.D.   On: 08/31/2014 22:29   Ct Abdomen Pelvis W Contrast  09/01/2014   CLINICAL DATA:  Acute onset of upper abdominal pain, and left upper back pain. Personal history of pancreatitis. Initial encounter.  EXAM: CT ABDOMEN AND PELVIS WITH CONTRAST  TECHNIQUE: Multidetector CT imaging of the abdomen and pelvis was performed using the standard protocol following bolus administration of intravenous contrast.  CONTRAST:  179mL OMNIPAQUE IOHEXOL 300 MG/ML  SOLN  COMPARISON:  CT of the abdomen and pelvis from 02/28/2014  FINDINGS: Bibasilar atelectasis or scarring is noted.  The liver and spleen are unremarkable in appearance. The patient is status post cholecystectomy, with clips noted along the gallbladder fossa.  Calcification is noted at the pancreatic head and pancreatic tail, likely reflecting chronic sequelae of pancreatitis. Slight haziness about the pancreatic head may be chronic in nature, given the lack of an elevated lipase. An apparent residual 2.1 cm pseudocyst is noted anterior to the body of the pancreas, slightly decreased in size from the prior study. The adrenal glands are unremarkable in appearance.  An 8 mm cyst is noted at the lower pole of the right kidney. The kidneys are otherwise unremarkable in appearance. There is no evidence of hydronephrosis. No renal or ureteral stones are seen. No perinephric stranding is appreciated.  No free fluid is identified. The small bowel is unremarkable in appearance. The stomach is within normal limits. No acute vascular abnormalities are seen. Diffuse calcification is seen along the abdominal aorta and its branches, including at the origin of the left renal artery. Two right-sided renal arteries are seen.  The appendix is not definitely  characterized; there is no evidence for appendicitis. The colon is unremarkable in appearance.  The bladder is mildly distended and grossly unremarkable. The prostate remains normal in size, with minimal calcification. No inguinal lymphadenopathy is seen.  No acute osseous abnormalities are identified.  IMPRESSION: 1. Slight haziness about the pancreatic head may be chronic in nature, given the lack of an elevated lipase. Apparent residual 2.1 cm pseudocyst seen anterior to the body of the pancreas, slightly decreased in size from the prior study. Calcification at the pancreatic head and tail likely reflect chronic sequelae of pancreatitis. 2. Bibasilar atelectasis or scarring noted. 3. Small right renal cyst seen. 4. Diffuse calcification along the abdominal aorta and its branches, including at the origin of the left renal artery.   Electronically Signed   By: Garald Balding M.D.   On: 09/01/2014 00:31     Labs:   Basic Metabolic Panel:  Recent Labs Lab 08/31/14 2008 09/01/14 0544 09/02/14 0445 09/03/14 0442  NA 143 140 136* 141  K 4.0 3.7 3.3* 4.2  CL 101 101 100 105  CO2 26 26 23 23   GLUCOSE 106* 88 116* 117*  BUN 10 8 5* 3*  CREATININE 0.70 0.69 0.57 0.61  CALCIUM 10.1 9.5 8.9 9.3   GFR Estimated Creatinine Clearance: 78 mL/min (by C-G  formula based on Cr of 0.61). Liver Function Tests:  Recent Labs Lab 08/31/14 2255 09/02/14 0445  AST 33 21  ALT 15 12  ALKPHOS 85 76  BILITOT 0.4 0.4  PROT 8.1 7.0  ALBUMIN 4.1 3.4*    Recent Labs Lab 08/31/14 2008  LIPASE 55   CBC:  Recent Labs Lab 08/31/14 2008 09/01/14 0544 09/02/14 0445 09/03/14 0442  WBC 5.3 3.2* 2.2* 3.3*  NEUTROABS 3.5  --   --  1.5*  HGB 13.8 12.9* 12.1* 12.5*  HCT 40.4 37.7* 36.0* 37.3*  MCV 100.7* 100.8* 101.1* 100.0  PLT 140* 130* 125* 136*   Cardiac Enzymes:  Recent Labs Lab 08/31/14 2255  TROPONINI <0.30   CBG:  Recent Labs Lab 09/03/14 2012 09/04/14 0423 09/04/14 0740  09/04/14 1202  GLUCAP 117* 123* 122* 83   Microbiology Recent Results (from the past 240 hour(s))  MRSA PCR Screening     Status: Abnormal   Collection Time: 09/01/14  3:53 AM  Result Value Ref Range Status   MRSA by PCR POSITIVE (A) NEGATIVE Final    Comment:        The GeneXpert MRSA Assay (FDA approved for NASAL specimens only), is one component of a comprehensive MRSA colonization surveillance program. It is not intended to diagnose MRSA infection nor to guide or monitor treatment for MRSA infections. RESULT CALLED TO, READ BACK BY AND VERIFIED WITH: Laury Axon [id number] @ [id number] BY J SCOTTON      Discharge Instructions:       Discharge Instructions    Call MD for:  persistant nausea and vomiting    Complete by:  As directed      Call MD for:  severe uncontrolled pain    Complete by:  As directed      Call MD for:    Complete by:  As directed   Persistent diarrhea.     Diet - low sodium heart healthy    Complete by:  As directed      Discharge instructions    Complete by:  As directed   If you have any questions about your discharge medications or the care you received while you were in the hospital after you are discharged, you can call the unit and ask to speak with the hospitalist on call if the hospitalist that took care of you is not available. Once you are discharged, your primary care physician will handle any further medical issues. Please note that NO REFILLS for any discharge medications will be authorized once you are discharged, as it is imperative that you return to your primary care physician (or establish a relationship with a primary care physician if you do not have one) for your aftercare needs so that they can reassess your need for medications and monitor your lab values.  Any outstanding tests can be reviewed by your PCP at your follow up visit.  It is also important to review any medicine changes with your PCP.  Please bring these d/c instructions with  you to your next visit so your physician can review these changes with you.  If you do not have a primary care physician, you can call [phone] for a physician referral.  It is highly recommended that you obtain a PCP for hospital follow up.     Increase activity slowly    Complete by:  As directed             Medication List    TAKE these medications  doxylamine (Sleep) 25 MG tablet  Commonly known as:  UNISOM  Take 75 mg by mouth at bedtime as needed for sleep (for sleep when patient is out of temazepam).     feeding supplement (OSMOLITE 1.5 CAL) Liqd  Take 237 mLs by mouth daily. Drinks 4 cans per day     ENSURE PO  Take 1 Can by mouth 3 (three) times daily.     fentaNYL 50 MCG/HR  Commonly known as:  DURAGESIC - dosed mcg/hr  Place 1 patch onto the skin every 3 (three) days.     FISH OIL PO  Take 1 capsule by mouth daily.     hydrOXYzine 25 MG capsule  Commonly known as:  VISTARIL  Take 25 mg by mouth at bedtime.     levothyroxine 25 MCG tablet  Commonly known as:  SYNTHROID, LEVOTHROID  Take 25 mcg by mouth daily.     lipase/protease/amylase 12000 UNITS Cpep capsule  Commonly known as:  CREON  Take 1 capsule by mouth 4 (four) times daily.     magic mouthwash w/lidocaine Soln  Take 5 mLs by mouth every 6 (six) hours as needed (Swish and Spit).     metoprolol 50 MG tablet  Commonly known as:  LOPRESSOR  Take 50 mg by mouth 2 (two) times daily.     omeprazole 40 MG capsule  Commonly known as:  PRILOSEC  Take 40 mg by mouth daily.     oxyCODONE 15 MG immediate release tablet  Commonly known as:  ROXICODONE  Take 15 mg by mouth every 4 (four) hours.     polyethylene glycol packet  Commonly known as:  MIRALAX / GLYCOLAX  MIX THE CONTENTS OF 1 PACKET IN DESIRED LIQUID AND DRINK DAILY     promethazine 25 MG tablet  Commonly known as:  PHENERGAN  Take 1 tablet (25 mg total) by mouth every 6 (six) hours as needed. For nausea     VITAMIN D PO  Take  1,000 mg by mouth daily.      ASK your doctor about these medications        temazepam 30 MG capsule  Commonly known as:  RESTORIL  Take 30 mg by mouth as directed. Patient takes 30 mg at bedtime, and if he awakes in the middle of the nigh patient takes another 30 mg.       Follow-up Information    Follow up with Harvie Junior, MD. Schedule an appointment as soon as possible for a visit in 2 weeks.   Specialty:  Specialist   Why:  Hospital follow up.   Contact information:   [place] [address] [phone]        Time coordinating discharge: 35 minutes.  Signed:  RAMA,CHRISTINA  Pager [phone] [facility] 09/04/2014, 1:48 PM

## 2014-09-16 ENCOUNTER — Other Ambulatory Visit: Payer: Self-pay

## 2014-09-18 ENCOUNTER — Telehealth: Payer: Self-pay | Admitting: *Deleted

## 2014-09-18 NOTE — Telephone Encounter (Signed)
Faxed form to [employer] for prior approval for compression garments

## 2014-09-30 ENCOUNTER — Telehealth: Payer: Self-pay | Admitting: *Deleted

## 2014-10-16 ENCOUNTER — Other Ambulatory Visit (HOSPITAL_COMMUNITY): Payer: Self-pay | Admitting: Interventional Radiology

## 2014-10-16 DIAGNOSIS — R633 Feeding difficulties, unspecified: Secondary | ICD-10-CM

## 2014-10-17 ENCOUNTER — Ambulatory Visit (HOSPITAL_COMMUNITY)
Admission: RE | Admit: 2014-10-17 | Discharge: 2014-10-17 | Disposition: A | Discharge status: Home / Self Care | Payer: Medicaid Other | Source: Ambulatory Visit | Attending: Hematology and Oncology | Admitting: Hematology and Oncology

## 2014-10-17 ENCOUNTER — Other Ambulatory Visit (HOSPITAL_BASED_OUTPATIENT_CLINIC_OR_DEPARTMENT_OTHER): Payer: Medicaid Other

## 2014-10-17 ENCOUNTER — Encounter (HOSPITAL_COMMUNITY): Payer: Self-pay

## 2014-10-17 ENCOUNTER — Ambulatory Visit (HOSPITAL_COMMUNITY): Admission: RE | Admit: 2014-10-17 | Payer: Self-pay | Source: Ambulatory Visit

## 2014-10-17 DIAGNOSIS — Z923 Personal history of irradiation: Secondary | ICD-10-CM | POA: Insufficient documentation

## 2014-10-17 DIAGNOSIS — C099 Malignant neoplasm of tonsil, unspecified: Secondary | ICD-10-CM | POA: Insufficient documentation

## 2014-10-17 DIAGNOSIS — R590 Localized enlarged lymph nodes: Secondary | ICD-10-CM | POA: Insufficient documentation

## 2014-10-17 DIAGNOSIS — Z9221 Personal history of antineoplastic chemotherapy: Secondary | ICD-10-CM | POA: Diagnosis not present

## 2014-10-17 DIAGNOSIS — Z23 Encounter for immunization: Secondary | ICD-10-CM

## 2014-10-17 LAB — CBC WITH DIFFERENTIAL/PLATELET
BASO%: 0.2 % (ref 0.0–2.0)
BASOS ABS: 0 10*3/uL (ref 0.0–0.1)
EOS%: 1 % (ref 0.0–7.0)
Eosinophils Absolute: 0 10*3/uL (ref 0.0–0.5)
HEMATOCRIT: 39.6 % (ref 38.4–49.9)
HGB: 13.1 g/dL (ref 13.0–17.1)
LYMPH#: 0.9 10*3/uL (ref 0.9–3.3)
LYMPH%: 22.7 % (ref 14.0–49.0)
MCH: 33.3 pg (ref 27.2–33.4)
MCHC: 33.1 g/dL (ref 32.0–36.0)
MCV: 100.8 fL — ABNORMAL HIGH (ref 79.3–98.0)
MONO#: 0.7 10*3/uL (ref 0.1–0.9)
MONO%: 18 % — AB (ref 0.0–14.0)
NEUT%: 58.1 % (ref 39.0–75.0)
NEUTROS ABS: 2.4 10*3/uL (ref 1.5–6.5)
Platelets: 124 10*3/uL — ABNORMAL LOW (ref 140–400)
RBC: 3.93 10*6/uL — AB (ref 4.20–5.82)
RDW: 13.2 % (ref 11.0–14.6)
WBC: 4.1 10*3/uL (ref 4.0–10.3)

## 2014-10-17 LAB — COMPREHENSIVE METABOLIC PANEL (CC13)
ALBUMIN: 4.5 g/dL (ref 3.5–5.0)
ALT: 14 U/L (ref 0–55)
ANION GAP: 12 meq/L — AB (ref 3–11)
AST: 26 U/L (ref 5–34)
Alkaline Phosphatase: 92 U/L (ref 40–150)
BUN: 7.9 mg/dL (ref 7.0–26.0)
CHLORIDE: 100 meq/L (ref 98–109)
CO2: 26 mEq/L (ref 22–29)
CREATININE: 0.8 mg/dL (ref 0.7–1.3)
Calcium: 9.4 mg/dL (ref 8.4–10.4)
Glucose: 114 mg/dl (ref 70–140)
Potassium: 4 mEq/L (ref 3.5–5.1)
Sodium: 138 mEq/L (ref 136–145)
Total Bilirubin: 0.58 mg/dL (ref 0.20–1.20)
Total Protein: 8.2 g/dL (ref 6.4–8.3)

## 2014-10-17 LAB — TSH CHCC: TSH: 2.545 m(IU)/L (ref 0.320–4.118)

## 2014-10-17 MED ORDER — IOHEXOL 300 MG/ML  SOLN
100.0000 mL | Freq: Once | INTRAMUSCULAR | Status: AC | PRN
  Administered 2014-10-17: 80 mL via INTRAVENOUS

## 2014-10-18 ENCOUNTER — Encounter: Payer: Self-pay | Admitting: Hematology and Oncology

## 2014-10-18 ENCOUNTER — Ambulatory Visit (HOSPITAL_BASED_OUTPATIENT_CLINIC_OR_DEPARTMENT_OTHER): Payer: Medicaid Other | Admitting: Hematology and Oncology

## 2014-10-18 ENCOUNTER — Telehealth: Payer: Self-pay | Admitting: Hematology and Oncology

## 2014-10-18 VITALS — BP 135/74 | HR 74 | Temp 98.2°F | Resp 17 | Ht 66.0 in | Wt 122.0 lb

## 2014-10-18 DIAGNOSIS — Z720 Tobacco use: Secondary | ICD-10-CM

## 2014-10-18 DIAGNOSIS — E039 Hypothyroidism, unspecified: Secondary | ICD-10-CM

## 2014-10-18 DIAGNOSIS — C099 Malignant neoplasm of tonsil, unspecified: Secondary | ICD-10-CM

## 2014-10-18 DIAGNOSIS — Z931 Gastrostomy status: Secondary | ICD-10-CM

## 2014-10-18 DIAGNOSIS — K1370 Unspecified lesions of oral mucosa: Secondary | ICD-10-CM

## 2014-10-18 NOTE — Assessment & Plan Note (Signed)
He is feeding tube looks fine. The patient is able to swallow without dysphagia. I recommend removal of feeding tube

## 2014-10-18 NOTE — Assessment & Plan Note (Signed)
I spent some time counseling the patient the importance of tobacco cessation. He is currently not interested to quit now.  

## 2014-10-18 NOTE — Assessment & Plan Note (Signed)
He will continue thyroid replacement therapy. Recent TSH was normal

## 2014-10-18 NOTE — Telephone Encounter (Signed)
Pt confirmed labs/ov per 01/15 POF, gave pt AVS... KJ, lft msg with IR to have tube removed.Marland KitchenMarland KitchenMarland Kitchen

## 2014-10-18 NOTE — Progress Notes (Signed)
[place] OFFICE PROGRESS NOTE  Patient Care Team: Harvie Junior, MD as PCP - General (Specialist) Rozetta Nunnery, MD (Otolaryngology) Jodelle Gross, MD (Radiation Oncology) Lenn Cal, DDS as Consulting Physician (Dentistry) Heath Lark, MD as Consulting Physician (Hematology and Oncology)  SUMMARY OF ONCOLOGIC HISTORY: This is a pleasant patient was found to have abnormal looking tonsil in 2012 by his dentist. 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 He underwent concurrent chemoradiation therapy with weekly cisplatin from 02/01/2011 through 03/22/2011. Chemotherapy was discontinued early due to 2 prolonged pancytopenia. In 2013, he was noted to have enlarging lymphadenopathy. 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 Last imaging study from 10/29/2013 and January 2016 showed no evidence of disease recurrence  INTERVAL HISTORY: Please see below for problem oriented charting. He still has mild persistent mouth sores. He denies any dysphagia or diagnosis stage. He continued to smoke and attempting to quit. He denies recent alcohol intake. He denies new lymphadenopathy   REVIEW OF SYSTEMS:   Constitutional: Denies fevers, chills or abnormal weight loss Eyes: Denies blurriness of vision Respiratory: Denies cough, dyspnea or wheezes Cardiovascular: Denies palpitation, chest discomfort or lower extremity swelling Gastrointestinal:  Denies nausea, heartburn or change in bowel habits Skin: Denies abnormal skin rashes Lymphatics: Denies new lymphadenopathy or easy bruising Neurological:Denies numbness, tingling or new weaknesses Behavioral/Psych: Mood is stable, no new changes  All other systems were reviewed with the patient and  are negative.  I have reviewed the past medical history, past surgical history, social history and family history with the patient and they are unchanged from previous note.  ALLERGIES:  has No Known Allergies.  MEDICATIONS:  Current Outpatient Prescriptions  Medication Sig Dispense Refill  . Cholecalciferol (VITAMIN D PO) Take 1,000 mg by mouth daily.     Marland Kitchen doxylamine, Sleep, (UNISOM) 25 MG tablet Take 75 mg by mouth at bedtime as needed for sleep (for sleep when patient is out of temazepam).     . fentaNYL (DURAGESIC - DOSED MCG/HR) 50 MCG/HR Place 1 patch onto the skin every 3 (three) days.    . hydrOXYzine (VISTARIL) 25 MG capsule Take 25 mg by mouth at bedtime.  5  . levothyroxine (SYNTHROID, LEVOTHROID) 25 MCG tablet Take 25 mcg by mouth daily.  5  . lipase/protease/amylase (CREON-12/PANCREASE) 12000 UNITS CPEP capsule Take 1 capsule by mouth 4 (four) times daily.    . metoprolol (LOPRESSOR) 50 MG tablet Take 50 mg by mouth 2 (two) times daily.    . Nutritional Supplements (ENSURE PO) Take 1 Can by mouth 3 (three) times daily.    . Nutritional Supplements (FEEDING SUPPLEMENT, OSMOLITE 1.5 CAL,) LIQD Take 237 mLs by mouth daily. Drinks 4 cans per day    . Omega-3 Fatty Acids (FISH OIL PO) Take 1 capsule by mouth daily.    Marland Kitchen omeprazole (PRILOSEC) 40 MG capsule Take 40 mg by mouth daily.     Marland Kitchen oxyCODONE (ROXICODONE) 15 MG immediate release tablet Take 15 mg by mouth every 4 (four) hours.     . polyethylene glycol (MIRALAX / GLYCOLAX) packet MIX THE CONTENTS OF 1 PACKET IN DESIRED LIQUID AND DRINK DAILY 30 packet 0  . promethazine (PHENERGAN) 25 MG tablet Take 1 tablet (25 mg total) by mouth every 6 (six) hours as needed. For nausea  30 tablet 2  . temazepam (RESTORIL) 30 MG capsule Take 30 mg by mouth as directed. Patient takes 30 mg at bedtime, and if he awakes in the middle of the nigh patient takes another 30 mg.    . Alum & Mag Hydroxide-Simeth (MAGIC MOUTHWASH W/LIDOCAINE) SOLN Take 5  mLs by mouth every 6 (six) hours as needed (Swish and Spit). (Patient not taking: Reported on 08/31/2014) 500 mL 0   No current facility-administered medications for this visit.    PHYSICAL EXAMINATION: ECOG PERFORMANCE STATUS: 1 - Symptomatic but completely ambulatory  Filed Vitals:   10/18/14 1230  BP: 135/74  Pulse: 74  Temp: 98.2 F (36.8 C)  Resp: 17   Filed Weights   10/18/14 1230  Weight: 122 lb (55.339 kg)    GENERAL:alert, no distress and comfortable SKIN: skin color, texture, turgor are normal, no rashes or significant lesions EYES: normal, Conjunctiva are pink and non-injected, sclera clear OROPHARYNX:no exudate, no erythema and lips, buccal mucosa, and tongue normal  NECK: Neck is thick from radiation-induced fibrosis. LYMPH:  no palpable lymphadenopathy in the cervical, axillary or inguinal LUNGS: clear to auscultation and percussion with normal breathing effort HEART: regular rate & rhythm and no murmurs and no lower extremity edema ABDOMEN:abdomen soft, non-tender and normal bowel sounds. Feeding tube site looks okay Musculoskeletal:no cyanosis of digits and no clubbing  NEURO: alert & oriented x 3 with fluent speech, no focal motor/sensory deficits  LABORATORY DATA:  I have reviewed the data as listed    Component Value Date/Time   NA 138 10/17/2014 1141   NA 141 09/03/2014 0442   NA 140 04/24/2012 1200   K 4.0 10/17/2014 1141   K 4.2 09/03/2014 0442   K 4.1 04/24/2012 1200   CL 105 09/03/2014 0442   CL 102 02/27/2013 1045   CL 98 04/24/2012 1200   CO2 26 10/17/2014 1141   CO2 23 09/03/2014 0442   CO2 30 04/24/2012 1200   GLUCOSE 114 10/17/2014 1141   GLUCOSE 117* 09/03/2014 0442   GLUCOSE 118* 02/27/2013 1045   GLUCOSE 102 04/24/2012 1200   BUN 7.9 10/17/2014 1141   BUN 3* 09/03/2014 0442   BUN 13 04/24/2012 1200   CREATININE 0.8 10/17/2014 1141   CREATININE 0.61 09/03/2014 0442   CREATININE 0.6 04/24/2012 1200   CALCIUM 9.4 10/17/2014 1141    CALCIUM 9.3 09/03/2014 0442   CALCIUM 9.6 04/24/2012 1200   PROT 8.2 10/17/2014 1141   PROT 7.0 09/02/2014 0445   PROT 8.1 04/24/2012 1200   ALBUMIN 4.5 10/17/2014 1141   ALBUMIN 3.4* 09/02/2014 0445   AST 26 10/17/2014 1141   AST 21 09/02/2014 0445   AST 30 04/24/2012 1200   ALT 14 10/17/2014 1141   ALT 12 09/02/2014 0445   ALT 27 04/24/2012 1200   ALKPHOS 92 10/17/2014 1141   ALKPHOS 76 09/02/2014 0445   ALKPHOS 77 04/24/2012 1200   BILITOT 0.58 10/17/2014 1141   BILITOT 0.4 09/02/2014 0445   BILITOT 0.40 04/24/2012 1200   GFRNONAA >90 09/03/2014 0442   GFRAA >90 09/03/2014 0442    No results found for: SPEP, UPEP  Lab Results  Component Value Date   WBC 4.1 10/17/2014   NEUTROABS 2.4 10/17/2014   HGB 13.1 10/17/2014   HCT 39.6 10/17/2014   MCV 100.8* 10/17/2014   PLT 124* 10/17/2014      Chemistry      Component Value Date/Time   NA 138 10/17/2014 1141   NA 141  09/03/2014 0442   NA 140 04/24/2012 1200   K 4.0 10/17/2014 1141   K 4.2 09/03/2014 0442   K 4.1 04/24/2012 1200   CL 105 09/03/2014 0442   CL 102 02/27/2013 1045   CL 98 04/24/2012 1200   CO2 26 10/17/2014 1141   CO2 23 09/03/2014 0442   CO2 30 04/24/2012 1200   BUN 7.9 10/17/2014 1141   BUN 3* 09/03/2014 0442   BUN 13 04/24/2012 1200   CREATININE 0.8 10/17/2014 1141   CREATININE 0.61 09/03/2014 0442   CREATININE 0.6 04/24/2012 1200      Component Value Date/Time   CALCIUM 9.4 10/17/2014 1141   CALCIUM 9.3 09/03/2014 0442   CALCIUM 9.6 04/24/2012 1200   ALKPHOS 92 10/17/2014 1141   ALKPHOS 76 09/02/2014 0445   ALKPHOS 77 04/24/2012 1200   AST 26 10/17/2014 1141   AST 21 09/02/2014 0445   AST 30 04/24/2012 1200   ALT 14 10/17/2014 1141   ALT 12 09/02/2014 0445   ALT 27 04/24/2012 1200   BILITOT 0.58 10/17/2014 1141   BILITOT 0.4 09/02/2014 0445   BILITOT 0.40 04/24/2012 1200       RADIOGRAPHIC STUDIES: I have personally reviewed the radiological images as listed and agreed  with the findings in the report. Ct Chest W Contrast  10/17/2014   CLINICAL DATA:  Tonsillar cancer diagnosed in 2000 level with chemotherapy and radiation therapy completed in 2012. Mediastinal adenopathy. Exclude recurrence.  EXAM: CT CHEST WITH CONTRAST  TECHNIQUE: Multidetector CT imaging of the chest was performed during intravenous contrast administration.  CONTRAST:  38mL OMNIPAQUE IOHEXOL 300 MG/ML  SOLN  COMPARISON:  Chest radiograph of 08/31/2014. Most recent CT of 06/04/2014. Abdominal pelvic CT of 08/31/2014 also reviewed.  FINDINGS: Mediastinum/Nodes: No supraclavicular adenopathy. Significant stenosis of the proximal right common carotid artery. Example image 10 of series 2. Central venous insufficiency at the left brachiocephalic vein and SVC, which are likely chronically occluded. Extensive collateral veins throughout the mediastinum with enlargement of the azygous and hemi azygous veins.  Normal heart size with multivessel coronary artery atherosclerosis. No central pulmonary embolism, on this non-dedicated study. AP window node measures 9 mm on image 25 versus 1.0 cm on the prior.  Subcarinal nodes measure maximally 9 mm today versus 11 mm on the prior exam (when remeasured). No hilar adenopathy.  Lungs/Pleura: Centrilobular emphysema.  Bibasilar scarring.  Upper abdomen: Minimal pneumobilia. Normal imaged portions of the spleen, adrenal glands, right kidney. Interpolar left renal lesion which measures 19 HU, likely a minimally complex cyst. 9 mm. This may have enlarged minimally since the 05/24/11 PET.  Findings likely of chronic pancreatitis, including atrophy of mild ductal dilatation in the body and tail. Incompletely imaged. Peripancreatic cystic lesion on the 09/01/2014 exam is less well-defined today, including on image 61.  Musculoskeletal: No acute osseous abnormality.  IMPRESSION: 1. Improvement to resolution in mediastinal adenopathy, which was likely reactive. 2. No evidence of  metastatic disease within the chest. 3. 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 4. Extensive atherosclerosis, including significant stenosis of the proximal right common carotid artery.   Electronically Signed   By: Abigail Miyamoto M.D.   On: 10/17/2014 15:35     ASSESSMENT & PLAN:  Malignant neoplasm of tonsil Clinically, he has no signs of disease recurrence. He will continue ENT visit. I will arrange for feeding tube to be removed. I will see him back in 6 months with history, physical  examination and blood work   Nicotine abuse I spent some time counseling the patient the importance of tobacco cessation. He is currently not interested to quit now.    Hypothyroidism (acquired) He will continue thyroid replacement therapy. Recent TSH was normal   S/P gastrostomy He is feeding tube looks fine. The patient is able to swallow without dysphagia. I recommend removal of feeding tube    Orders Placed This Encounter  Procedures  . IR GASTROSTOMY TUBE REMOVAL    Standing Status: Future     Number of Occurrences:      Standing Expiration Date: 12/17/2015    Order Specific Question:  Reason for Exam (SYMPTOM  OR DIAGNOSIS REQUIRED)    Answer:  remove feding tube, not needed    Order Specific Question:  Preferred Imaging Location?    Answer:  [facility]  . CBC with Differential    Standing Status: Future     Number of Occurrences:      Standing Expiration Date: 11/22/2015  . Comprehensive metabolic panel    Standing Status: Future     Number of Occurrences:      Standing Expiration Date: 11/22/2015  . TSH    Standing Status: Future     Number of Occurrences:      Standing Expiration Date: 11/22/2015   All questions were answered. The patient knows to call the clinic with any problems, questions or concerns. No barriers to learning was detected. I spent 25 minutes counseling the patient face to face. The total time  spent in the appointment was 30 minutes and more than 50% was on counseling and review of test results     [facility], [place], MD 10/18/2014 2:40 PM

## 2014-10-18 NOTE — Assessment & Plan Note (Signed)
Clinically, he has no signs of disease recurrence. He will continue ENT visit. I will arrange for feeding tube to be removed. I will see him back in 6 months with history, physical examination and blood work

## 2014-10-21 ENCOUNTER — Ambulatory Visit (HOSPITAL_COMMUNITY)
Admission: RE | Admit: 2014-10-21 | Discharge: 2014-10-21 | Disposition: A | Discharge status: Home / Self Care | Payer: Medicaid Other | Source: Ambulatory Visit | Attending: Interventional Radiology | Admitting: Interventional Radiology

## 2014-10-21 DIAGNOSIS — C099 Malignant neoplasm of tonsil, unspecified: Secondary | ICD-10-CM | POA: Insufficient documentation

## 2014-12-27 ENCOUNTER — Emergency Department (HOSPITAL_COMMUNITY): Payer: Medicaid Other

## 2014-12-27 ENCOUNTER — Emergency Department (HOSPITAL_COMMUNITY)
Admission: EM | Admit: 2014-12-27 | Discharge: 2014-12-27 | Disposition: A | Discharge status: Home / Self Care | Payer: Medicaid Other | Attending: Emergency Medicine | Admitting: Emergency Medicine

## 2014-12-27 ENCOUNTER — Encounter (HOSPITAL_COMMUNITY): Payer: Self-pay | Admitting: Emergency Medicine

## 2014-12-27 DIAGNOSIS — G40909 Epilepsy, unspecified, not intractable, without status epilepticus: Secondary | ICD-10-CM | POA: Insufficient documentation

## 2014-12-27 DIAGNOSIS — Z720 Tobacco use: Secondary | ICD-10-CM | POA: Diagnosis not present

## 2014-12-27 DIAGNOSIS — Z923 Personal history of irradiation: Secondary | ICD-10-CM | POA: Diagnosis not present

## 2014-12-27 DIAGNOSIS — Z8589 Personal history of malignant neoplasm of other organs and systems: Secondary | ICD-10-CM | POA: Insufficient documentation

## 2014-12-27 DIAGNOSIS — I10 Essential (primary) hypertension: Secondary | ICD-10-CM | POA: Diagnosis not present

## 2014-12-27 DIAGNOSIS — Z85818 Personal history of malignant neoplasm of other sites of lip, oral cavity, and pharynx: Secondary | ICD-10-CM | POA: Insufficient documentation

## 2014-12-27 DIAGNOSIS — K5900 Constipation, unspecified: Secondary | ICD-10-CM | POA: Insufficient documentation

## 2014-12-27 DIAGNOSIS — Z862 Personal history of diseases of the blood and blood-forming organs and certain disorders involving the immune mechanism: Secondary | ICD-10-CM | POA: Insufficient documentation

## 2014-12-27 DIAGNOSIS — R042 Hemoptysis: Secondary | ICD-10-CM | POA: Diagnosis present

## 2014-12-27 DIAGNOSIS — E039 Hypothyroidism, unspecified: Secondary | ICD-10-CM | POA: Insufficient documentation

## 2014-12-27 DIAGNOSIS — M1990 Unspecified osteoarthritis, unspecified site: Secondary | ICD-10-CM | POA: Insufficient documentation

## 2014-12-27 DIAGNOSIS — Z8619 Personal history of other infectious and parasitic diseases: Secondary | ICD-10-CM | POA: Diagnosis not present

## 2014-12-27 DIAGNOSIS — J0100 Acute maxillary sinusitis, unspecified: Secondary | ICD-10-CM | POA: Insufficient documentation

## 2014-12-27 DIAGNOSIS — Z79899 Other long term (current) drug therapy: Secondary | ICD-10-CM | POA: Diagnosis not present

## 2014-12-27 LAB — I-STAT CHEM 8, ED
BUN: 16 mg/dL (ref 6–23)
CALCIUM ION: 1.09 mmol/L — AB (ref 1.12–1.23)
CREATININE: 0.8 mg/dL (ref 0.50–1.35)
Chloride: 97 mmol/L (ref 96–112)
Glucose, Bld: 122 mg/dL — ABNORMAL HIGH (ref 70–99)
HCT: 45 % (ref 39.0–52.0)
Hemoglobin: 15.3 g/dL (ref 13.0–17.0)
POTASSIUM: 4.4 mmol/L (ref 3.5–5.1)
SODIUM: 138 mmol/L (ref 135–145)
TCO2: 25 mmol/L (ref 0–100)

## 2014-12-27 LAB — CBC WITH DIFFERENTIAL/PLATELET
BASOS PCT: 0 % (ref 0–1)
Basophils Absolute: 0 10*3/uL (ref 0.0–0.1)
EOS ABS: 0 10*3/uL (ref 0.0–0.7)
EOS PCT: 0 % (ref 0–5)
HCT: 40.5 % (ref 39.0–52.0)
HEMOGLOBIN: 13.4 g/dL (ref 13.0–17.0)
Lymphocytes Relative: 4 % — ABNORMAL LOW (ref 12–46)
Lymphs Abs: 0.3 10*3/uL — ABNORMAL LOW (ref 0.7–4.0)
MCH: 34 pg (ref 26.0–34.0)
MCHC: 33.1 g/dL (ref 30.0–36.0)
MCV: 102.8 fL — ABNORMAL HIGH (ref 78.0–100.0)
MONOS PCT: 11 % (ref 3–12)
Monocytes Absolute: 1 10*3/uL (ref 0.1–1.0)
Neutro Abs: 7.6 10*3/uL (ref 1.7–7.7)
Neutrophils Relative %: 85 % — ABNORMAL HIGH (ref 43–77)
PLATELETS: 133 10*3/uL — AB (ref 150–400)
RBC: 3.94 MIL/uL — ABNORMAL LOW (ref 4.22–5.81)
RDW: 13.4 % (ref 11.5–15.5)
WBC: 8.9 10*3/uL (ref 4.0–10.5)

## 2014-12-27 MED ORDER — HYDROMORPHONE HCL 1 MG/ML IJ SOLN
1.0000 mg | Freq: Once | INTRAMUSCULAR | Status: AC
  Administered 2014-12-27: 1 mg via INTRAVENOUS
  Filled 2014-12-27: qty 1

## 2014-12-27 MED ORDER — IOHEXOL 300 MG/ML  SOLN
100.0000 mL | Freq: Once | INTRAMUSCULAR | Status: AC | PRN
  Administered 2014-12-27: 100 mL via INTRAVENOUS

## 2014-12-27 MED ORDER — OXYCODONE-ACETAMINOPHEN 5-325 MG PO TABS
1.0000 | ORAL_TABLET | Freq: Once | ORAL | Status: AC
  Administered 2014-12-27: 1 via ORAL
  Filled 2014-12-27: qty 1

## 2014-12-27 MED ORDER — AMOXICILLIN-POT CLAVULANATE 875-125 MG PO TABS: 1.0000 | ORAL_TABLET | Freq: Two times a day (BID) | ORAL | Status: DC

## 2014-12-27 NOTE — ED Notes (Signed)
Attempted to start IV. Unsuccessful. 2nd RN to attempt at this time

## 2014-12-27 NOTE — ED Provider Notes (Signed)
CSN: [id number]     Arrival date & time 12/27/14  1849 History   First MD Initiated Contact with Patient 12/27/14 1912     Chief Complaint  Patient presents with  . Hemoptysis  . Throat Cancer   . Neck Pain     (Consider location/radiation/quality/duration/timing/severity/associated sxs/prior Treatment) Patient is a 59 y.o. male presenting with neck pain. The history is provided by the patient.  Neck Pain Associated symptoms: no chest pain    patient has a history of previous tonsillar cancer. Cancer free now as far as he knows. States that today began to have some pain on the left side of his jaw and neck and head. States his that is similar to the pain is had before. States he also had a little bit of cough with some bloody sputum. No shortness of breath. No fever. States he is worried that this is his cancer has returned. He does continue to smoke. He states it feels a little different on left side of his jaw also. He's had extensive surgery here in the past.   Past Medical History  Diagnosis Date  . Hypertension   . Chronic pancreatitis   . Arthritis   . Hx of radiation therapy 02/02/11 to 03/22/11    L tonsil  . Seizures     alcohol-related  . Cervical adenopathy 04/26/2012  . Urinary hesitancy   . Constipation   . History of blood transfusion ~ 2004    "from the pancreatitis"  . Daily headache 05/23/2012    "small ones"  . History of radiation therapy 02/02/2011-03/22/2011    head/neck,left tonsil  . G tube feedings     has a feeding tube in stomach  . Hypothyroidism 10/29/2013  . Thrush 11/02/2013  . 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 Pancytopenia 02/28/2014   Past Surgical History  Procedure Laterality Date  . Bile duct stent placement      hx of  . Radical neck dissection  05/23/2012    w/mass excision  . Cholecystectomy  04/2005  . Tibia fracture surgery  1990's    left  . Fracture surgery    .  Direct laryngoscopy  05/23/2012    Procedure: DIRECT LARYNGOSCOPY;  Surgeon: Rozetta Nunnery, MD;  Location: [facility];  Service: ENT;  Laterality: N/A;  . Mass biopsy  05/23/2012    Procedure: NECK MASS BIOPSY;  Surgeon: Rozetta Nunnery, MD;  Location: [place];  Service: ENT;  Laterality: N/A;  . Radical neck dissection  05/23/2012    Procedure: RADICAL NECK DISSECTION;  Surgeon: Rozetta Nunnery, MD;  Location: [place];  Service: ENT;  Laterality: Left;  . Direct laryngoscopy N/A 03/23/2013    Procedure: DIRECT LARYNGOSCOPY;  Surgeon: Rozetta Nunnery, MD;  Location: [place];  Service: ENT;  Laterality: N/A;  . Esophageal dilation N/A 03/23/2013    Procedure: ESOPHAGEAL DILATION;  Surgeon: Rozetta Nunnery, MD;  Location: [place];  Service: ENT;  Laterality: N/A;   Family History  Problem Relation Age of Onset  . COPD Mother    History  Substance Use Topics  . Smoking status: Current Every Day Smoker -- 0.25 packs/day for 40 years    Types: Cigarettes  . Smokeless tobacco: Never Used     Comment: hx 1/2 -1 PPD  . Alcohol Use: No     Comment: 05/23/2012 hx alcohol abuse, quit ~  2000    Review of Systems  Constitutional: Negative for diaphoresis and activity change.  HENT: Positive for facial swelling and sore throat. Negative for trouble swallowing and voice change.   Respiratory: Positive for cough. Negative for shortness of breath.   Cardiovascular: Negative for chest pain.  Gastrointestinal: Negative for abdominal pain and blood in stool.  Genitourinary: Negative for flank pain.  Musculoskeletal: Positive for neck pain.  Skin: Negative for wound.      Allergies  Review of patient's allergies indicates no known allergies.  Home Medications   Prior to Admission medications   Medication Sig Start Date End Date Taking? Authorizing Provider  Cholecalciferol (VITAMIN D PO) Take 1,000 mg by mouth daily.    Yes Historical Provider,  MD  doxylamine, Sleep, (UNISOM) 25 MG tablet Take 75 mg by mouth at bedtime as needed for sleep (sleep).    Yes Historical Provider, MD  fentaNYL (DURAGESIC - DOSED MCG/HR) 50 MCG/HR Place 1 patch onto the skin every 3 (three) days.   Yes Historical Provider, MD  hydrOXYzine (VISTARIL) 25 MG capsule Take 25 mg by mouth at bedtime. 08/15/14  Yes Historical Provider, MD  levothyroxine (SYNTHROID, LEVOTHROID) 50 MCG tablet Take 50 mcg by mouth daily before breakfast.   Yes Historical Provider, MD  lipase/protease/amylase (CREON-12/PANCREASE) 12000 UNITS CPEP capsule Take 1 capsule by mouth 4 (four) times daily.   Yes Historical Provider, MD  metoprolol (LOPRESSOR) 50 MG tablet Take 50 mg by mouth 2 (two) times daily.   Yes Historical Provider, MD  Nutritional Supplements (ENSURE PO) Take 1 Can by mouth 3 (three) times daily.   Yes Historical Provider, MD  Nutritional Supplements (FEEDING SUPPLEMENT, OSMOLITE 1.5 CAL,) LIQD Take 237 mLs by mouth daily. Drinks 4 cans per day   Yes Historical Provider, MD  Omega-3 Fatty Acids (FISH OIL PO) Take 1 capsule by mouth daily.   Yes Historical Provider, MD  omeprazole (PRILOSEC) 40 MG capsule Take 40 mg by mouth daily.    Yes Historical Provider, MD  oxyCODONE (ROXICODONE) 15 MG immediate release tablet Take 15 mg by mouth every 4 (four) hours.    Yes Historical Provider, MD  temazepam (RESTORIL) 30 MG capsule Take 60 mg by mouth at bedtime.    Yes Historical Provider, MD  Alum & Mag Hydroxide-Simeth (MAGIC MOUTHWASH W/LIDOCAINE) SOLN Take 5 mLs by mouth every 6 (six) hours as needed (Swish and Spit). Patient not taking: Reported on 08/31/2014 06/18/14   Heath Lark, MD  amoxicillin-clavulanate (AUGMENTIN) 875-125 MG per tablet Take 1 tablet by mouth every 12 (twelve) hours. 12/27/14   Davonna Belling, MD  polyethylene glycol (MIRALAX / GLYCOLAX) packet MIX THE CONTENTS OF 1 PACKET IN DESIRED LIQUID AND DRINK DAILY Patient not taking: Reported on 12/27/2014  08/01/14   Heath Lark, MD  promethazine (PHENERGAN) 25 MG tablet Take 1 tablet (25 mg total) by mouth every 6 (six) hours as needed. For nausea Patient not taking: Reported on 12/27/2014 08/28/12   Maryanna Shape, NP   BP 103/57 mmHg  Pulse 71  Temp(Src) 99.7 F (37.6 C) (Oral)  Resp 17  SpO2 94% Physical Exam  Constitutional: He appears well-developed.  HENT:  Some postsurgical changes to face. Mouth does show some redness internally and possible small amount of thrush on tongue. No fluctuance. Tender across mandible on left. Patient is edentulous  Eyes: EOM are normal.  Neck:  There is fullness on the left jaw and neck. Scar tissue possibly but patient cannot really describe  what this is been like previously.  Cardiovascular: Normal rate and regular rhythm.   Pulmonary/Chest: Effort normal.  Abdominal: Soft. There is no tenderness.  Musculoskeletal: Normal range of motion.  Neurological: He is alert.  Skin: Skin is warm.    ED Course  Procedures (including critical care time) Labs Review Labs Reviewed  CBC WITH DIFFERENTIAL/PLATELET - Abnormal; Notable for the following:    RBC 3.94 (*)    MCV 102.8 (*)    Platelets 133 (*)    Neutrophils Relative % 85 (*)    Lymphocytes Relative 4 (*)    Lymphs Abs 0.3 (*)    All other components within normal limits  I-STAT CHEM 8, ED - Abnormal; Notable for the following:    Glucose, Bld 122 (*)    Calcium, Ion 1.09 (*)    All other components within normal limits    Imaging Review Dg Chest 2 View  12/27/2014   CLINICAL DATA:  Hemoptysis, neck pain  EXAM: CHEST  2 VIEW  COMPARISON:  10/31/2013  FINDINGS: Cardiomediastinal silhouette is stable. Mild hyperinflation. No acute infiltrate or pleural effusion. No pulmonary edema. Bony thorax is unremarkable.  IMPRESSION: No active cardiopulmonary disease.  Mild hyperinflation.   Electronically Signed   By: Lahoma Crocker M.D.   On: 12/27/2014 21:09   Ct Soft Tissue Neck W  Contrast  12/27/2014   CLINICAL DATA:  History of tonsillar cancer. Pain in left side of face radiating in left side of neck and mandible. Hemoptysis.  EXAM: CT NECK WITH CONTRAST  TECHNIQUE: Multidetector CT imaging of the neck was performed using the standard protocol following the bolus administration of intravenous contrast.  CONTRAST:  166mL OMNIPAQUE IOHEXOL 300 MG/ML  SOLN  COMPARISON:  01/25/2014  FINDINGS: Pharynx and larynx: Unremarkable. Epiglottis and aryepiglottic folds are normal. No tonsillar mass.  Salivary glands: Unremarkable.  Thyroid: Normal.  Lymph nodes: No cervical adenopathy.  Vascular: Prior left jugular resection as part of left radical neck dissection. Severe stenosis of the proximal right common carotid artery again noted, stable. Calcifications in the carotid bulbs bilaterally without visible significant stenosis.  Limited intracranial: Unremarkable.  Visualized orbits: Unremarkable.  Mastoids and visualized paranasal sinuses: Mucosal thickening and air-fluid level noted in the left maxillary sinus, stable. Remainder of the paranasal sinuses are clear. Mastoid air cells are clear.  Skeleton: No acute bony abnormality or focal bone lesion. Anterior spurring at C6-7. Normal alignment.  Upper chest: No focal airspace opacity.  Other: Changes of left radical neck dissection again noted, stable. , left greater than right, likely related to postradiation changes. This is stable. Skin thickening noted bilaterally  IMPRESSION: Postoperative and postradiation changes within the left. No sign of recurrent disease/tumor.  Acute on chronic left maxillary sinusitis changes, stable.  Stable severe stenosis of the proximal right common carotid artery.   Electronically Signed   By: Rolm Baptise M.D.   On: 12/27/2014 21:15     EKG Interpretation None      MDM   Final diagnoses:  Acute maxillary sinusitis, recurrence not specified    Patient with neck pain. Previous tonsil cancer. Ct shows  sinusitis. Will treat with ABX since has abnormal anatomy    Davonna Belling, MD 12/28/14 [phone]

## 2014-12-27 NOTE — Discharge Instructions (Signed)

## 2014-12-27 NOTE — ED Notes (Addendum)
Pt with Hx of tonsillar cancer, no active chemo or radiation, felt squeezing pain on left side of face radiating down left side shortly after waking up this morning. Pt states pain is worst in neck and left mandible. Pt states that around 1830 he coughed up blood-streaked mucus.

## 2014-12-27 NOTE — ED Notes (Signed)
Nurse drawing labs. 

## 2015-01-17 ENCOUNTER — Inpatient Hospital Stay (HOSPITAL_COMMUNITY)
Admission: EM | Admit: 2015-01-17 | Discharge: 2015-01-19 | DRG: 440 | Disposition: A | Discharge status: Home / Self Care | Payer: Medicaid Other | Attending: Internal Medicine | Admitting: Internal Medicine

## 2015-01-17 ENCOUNTER — Emergency Department (HOSPITAL_COMMUNITY): Payer: Medicaid Other

## 2015-01-17 ENCOUNTER — Encounter (HOSPITAL_COMMUNITY): Payer: Self-pay | Admitting: Emergency Medicine

## 2015-01-17 DIAGNOSIS — K861 Other chronic pancreatitis: Secondary | ICD-10-CM | POA: Diagnosis present

## 2015-01-17 DIAGNOSIS — M1990 Unspecified osteoarthritis, unspecified site: Secondary | ICD-10-CM | POA: Diagnosis present

## 2015-01-17 DIAGNOSIS — I10 Essential (primary) hypertension: Secondary | ICD-10-CM | POA: Diagnosis present

## 2015-01-17 DIAGNOSIS — F1910 Other psychoactive substance abuse, uncomplicated: Secondary | ICD-10-CM

## 2015-01-17 DIAGNOSIS — E039 Hypothyroidism, unspecified: Secondary | ICD-10-CM | POA: Diagnosis present

## 2015-01-17 DIAGNOSIS — Z22322 Carrier or suspected carrier of Methicillin resistant Staphylococcus aureus: Secondary | ICD-10-CM

## 2015-01-17 DIAGNOSIS — K859 Acute pancreatitis, unspecified: Secondary | ICD-10-CM | POA: Diagnosis not present

## 2015-01-17 DIAGNOSIS — F17210 Nicotine dependence, cigarettes, uncomplicated: Secondary | ICD-10-CM | POA: Diagnosis present

## 2015-01-17 DIAGNOSIS — R1013 Epigastric pain: Secondary | ICD-10-CM | POA: Diagnosis not present

## 2015-01-17 DIAGNOSIS — Z79899 Other long term (current) drug therapy: Secondary | ICD-10-CM | POA: Diagnosis not present

## 2015-01-17 DIAGNOSIS — Z85818 Personal history of malignant neoplasm of other sites of lip, oral cavity, and pharynx: Secondary | ICD-10-CM | POA: Diagnosis not present

## 2015-01-17 DIAGNOSIS — Z923 Personal history of irradiation: Secondary | ICD-10-CM

## 2015-01-17 DIAGNOSIS — Z720 Tobacco use: Secondary | ICD-10-CM | POA: Diagnosis present

## 2015-01-17 LAB — COMPREHENSIVE METABOLIC PANEL
ALT: 19 U/L (ref 0–53)
ANION GAP: 7 (ref 5–15)
AST: 31 U/L (ref 0–37)
Albumin: 4.7 g/dL (ref 3.5–5.2)
Alkaline Phosphatase: 88 U/L (ref 39–117)
BILIRUBIN TOTAL: 0.5 mg/dL (ref 0.3–1.2)
BUN: 12 mg/dL (ref 6–23)
CO2: 31 mmol/L (ref 19–32)
Calcium: 9.4 mg/dL (ref 8.4–10.5)
Chloride: 101 mmol/L (ref 96–112)
Creatinine, Ser: 0.65 mg/dL (ref 0.50–1.35)
GFR calc Af Amer: >90 mL/min (ref >90)
Glucose, Bld: 118 mg/dL — ABNORMAL HIGH (ref 70–99)
Potassium: 3.5 mmol/L (ref 3.5–5.1)
Sodium: 139 mmol/L (ref 135–145)
Total Protein: 8.7 g/dL — ABNORMAL HIGH (ref 6.0–8.3)

## 2015-01-17 LAB — CBC WITH DIFFERENTIAL/PLATELET
BASOS PCT: 0 % (ref 0–1)
Basophils Absolute: 0 10*3/uL (ref 0.0–0.1)
EOS ABS: 0.1 10*3/uL (ref 0.0–0.7)
EOS PCT: 4 % (ref 0–5)
HCT: 41.9 % (ref 39.0–52.0)
Hemoglobin: 13.9 g/dL (ref 13.0–17.0)
Lymphocytes Relative: 27 % (ref 12–46)
Lymphs Abs: 0.9 10*3/uL (ref 0.7–4.0)
MCH: 34.2 pg — ABNORMAL HIGH (ref 26.0–34.0)
MCHC: 33.2 g/dL (ref 30.0–36.0)
MCV: 102.9 fL — ABNORMAL HIGH (ref 78.0–100.0)
Monocytes Absolute: 0.6 10*3/uL (ref 0.1–1.0)
Monocytes Relative: 17 % — ABNORMAL HIGH (ref 3–12)
NEUTROS ABS: 1.7 10*3/uL (ref 1.7–7.7)
Neutrophils Relative %: 52 % (ref 43–77)
Platelets: 141 10*3/uL — ABNORMAL LOW (ref 150–400)
RBC: 4.07 MIL/uL — ABNORMAL LOW (ref 4.22–5.81)
RDW: 13.3 % (ref 11.5–15.5)
WBC: 3.3 10*3/uL — ABNORMAL LOW (ref 4.0–10.5)

## 2015-01-17 LAB — TROPONIN I: Troponin I: <0.03 ng/mL (ref <0.031)

## 2015-01-17 LAB — LIPASE, BLOOD: Lipase: 28 U/L (ref 11–59)

## 2015-01-17 MED ORDER — HEPARIN SODIUM (PORCINE) 5000 UNIT/ML IJ SOLN
5000.0000 [IU] | Freq: Three times a day (TID) | INTRAMUSCULAR | Status: DC
  Administered 2015-01-17 – 2015-01-19 (×6): 5000 [IU] via SUBCUTANEOUS
  Filled 2015-01-17 (×9): qty 1

## 2015-01-17 MED ORDER — IOHEXOL 300 MG/ML  SOLN
50.0000 mL | Freq: Once | INTRAMUSCULAR | Status: AC | PRN
  Administered 2015-01-17: 50 mL via ORAL

## 2015-01-17 MED ORDER — SODIUM CHLORIDE 0.9 % IV SOLN
INTRAVENOUS | Status: DC
  Administered 2015-01-17 – 2015-01-19 (×3): via INTRAVENOUS

## 2015-01-17 MED ORDER — LEVOTHYROXINE SODIUM 50 MCG PO TABS
50.0000 ug | ORAL_TABLET | Freq: Every day | ORAL | Status: DC
  Administered 2015-01-18 – 2015-01-19 (×2): 50 ug via ORAL
  Filled 2015-01-17 (×3): qty 1

## 2015-01-17 MED ORDER — HYDROMORPHONE HCL 1 MG/ML IJ SOLN
1.0000 mg | Freq: Once | INTRAMUSCULAR | Status: AC
  Administered 2015-01-17: 1 mg via INTRAVENOUS
  Filled 2015-01-17: qty 1

## 2015-01-17 MED ORDER — ONDANSETRON HCL 4 MG/2ML IJ SOLN
4.0000 mg | Freq: Four times a day (QID) | INTRAMUSCULAR | Status: DC | PRN
  Administered 2015-01-19: 4 mg via INTRAVENOUS
  Filled 2015-01-17: qty 2

## 2015-01-17 MED ORDER — NICOTINE 21 MG/24HR TD PT24
21.0000 mg | MEDICATED_PATCH | TRANSDERMAL | Status: DC
  Administered 2015-01-17 – 2015-01-19 (×3): 21 mg via TRANSDERMAL
  Filled 2015-01-17 (×3): qty 1

## 2015-01-17 MED ORDER — ALBUTEROL SULFATE (2.5 MG/3ML) 0.083% IN NEBU: 2.5000 mg | INHALATION_SOLUTION | RESPIRATORY_TRACT | Status: DC | PRN

## 2015-01-17 MED ORDER — ONDANSETRON HCL 4 MG PO TABS: 4.0000 mg | ORAL_TABLET | Freq: Four times a day (QID) | ORAL | Status: DC | PRN

## 2015-01-17 MED ORDER — SODIUM CHLORIDE 0.9 % IV BOLUS (SEPSIS)
1000.0000 mL | Freq: Once | INTRAVENOUS | Status: AC
  Administered 2015-01-17: 1000 mL via INTRAVENOUS

## 2015-01-17 MED ORDER — VITAMIN D3 25 MCG (1000 UNIT) PO TABS
1000.0000 [IU] | ORAL_TABLET | Freq: Every day | ORAL | Status: DC
  Administered 2015-01-17 – 2015-01-19 (×3): 1000 [IU] via ORAL
  Filled 2015-01-17 (×3): qty 1

## 2015-01-17 MED ORDER — PANCRELIPASE (LIP-PROT-AMYL) 12000-38000 UNITS PO CPEP
1.0000 | ORAL_CAPSULE | Freq: Four times a day (QID) | ORAL | Status: DC
  Administered 2015-01-17 – 2015-01-19 (×8): 12000 [IU] via ORAL
  Filled 2015-01-17 (×10): qty 1

## 2015-01-17 MED ORDER — METOPROLOL TARTRATE 50 MG PO TABS
50.0000 mg | ORAL_TABLET | Freq: Two times a day (BID) | ORAL | Status: DC
  Administered 2015-01-18 – 2015-01-19 (×3): 50 mg via ORAL
  Filled 2015-01-17 (×5): qty 1

## 2015-01-17 MED ORDER — HYDROMORPHONE HCL 1 MG/ML IJ SOLN
1.0000 mg | INTRAMUSCULAR | Status: DC | PRN
  Administered 2015-01-17 – 2015-01-19 (×15): 1 mg via INTRAVENOUS
  Filled 2015-01-17 (×15): qty 1

## 2015-01-17 MED ORDER — ONDANSETRON HCL 4 MG/2ML IJ SOLN
4.0000 mg | Freq: Once | INTRAMUSCULAR | Status: AC
  Administered 2015-01-17: 4 mg via INTRAVENOUS
  Filled 2015-01-17: qty 2

## 2015-01-17 MED ORDER — IOHEXOL 300 MG/ML  SOLN
80.0000 mL | Freq: Once | INTRAMUSCULAR | Status: AC | PRN
  Administered 2015-01-17: 80 mL via INTRAVENOUS

## 2015-01-17 MED ORDER — FENTANYL 50 MCG/HR TD PT72
50.0000 ug | MEDICATED_PATCH | TRANSDERMAL | Status: DC
  Administered 2015-01-18: 50 ug via TRANSDERMAL
  Filled 2015-01-17: qty 1

## 2015-01-17 MED ORDER — PANTOPRAZOLE SODIUM 40 MG PO TBEC
40.0000 mg | DELAYED_RELEASE_TABLET | Freq: Every day | ORAL | Status: DC
  Administered 2015-01-18 – 2015-01-19 (×2): 40 mg via ORAL
  Filled 2015-01-17 (×2): qty 1

## 2015-01-17 MED ORDER — HYDROXYZINE PAMOATE 25 MG PO CAPS
25.0000 mg | ORAL_CAPSULE | Freq: Every day | ORAL | Status: DC
  Administered 2015-01-17 – 2015-01-18 (×2): 25 mg via ORAL
  Filled 2015-01-17 (×3): qty 1

## 2015-01-17 NOTE — ED Notes (Signed)
MD at bedside. 

## 2015-01-17 NOTE — ED Notes (Signed)
Per pt, states upper right abdominal pain for a couple of days-states last BM was Sunday-states he took 2 ducolax with no relief 2 days ago

## 2015-01-17 NOTE — H&P (Signed)
Patient Demographics  Russell Williamson, is a 59 y.o. male  MRN: [id number]   DOB - 1956-03-10  Admit Date - 01/17/2015  Outpatient Primary MD for the patient is Harvie Junior, MD   With History of -  Past Medical History  Diagnosis Date  . Hypertension   . Chronic pancreatitis   . Arthritis   . Hx of radiation therapy 02/02/11 to 03/22/11    L tonsil  . Seizures     alcohol-related  . Cervical adenopathy 04/26/2012  . Urinary hesitancy   . Constipation   . History of blood transfusion ~ 2004    "from the pancreatitis"  . Daily headache 05/23/2012    "small ones"  . History of radiation therapy 02/02/2011-03/22/2011    head/neck,left tonsil  . G tube feedings     has a feeding tube in stomach  . Hypothyroidism 10/29/2013  . Thrush 11/02/2013  . 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 Pancytopenia 02/28/2014      Past Surgical History  Procedure Laterality Date  . Bile duct stent placement      hx of  . Radical neck dissection  05/23/2012    w/mass excision  . Cholecystectomy  04/2005  . Tibia fracture surgery  1990's    left  . Fracture surgery    . Direct laryngoscopy  05/23/2012    Procedure: DIRECT LARYNGOSCOPY;  Surgeon: Rozetta Nunnery, MD;  Location: [place];  Service: ENT;  Laterality: N/A;  . Mass biopsy  05/23/2012    Procedure: NECK MASS BIOPSY;  Surgeon: Rozetta Nunnery, MD;  Location: [place];  Service: ENT;  Laterality: N/A;  . Radical neck dissection  05/23/2012    Procedure: RADICAL NECK DISSECTION;  Surgeon: Rozetta Nunnery, MD;  Location: [place];  Service: ENT;  Laterality: Left;  . Direct laryngoscopy N/A 03/23/2013    Procedure: DIRECT LARYNGOSCOPY;  Surgeon: Rozetta Nunnery, MD;  Location: [place];  Service: ENT;  Laterality: N/A;  . Esophageal dilation N/A 03/23/2013    Procedure: ESOPHAGEAL DILATION;  Surgeon: Rozetta Nunnery, MD;  Location: [place];  Service: ENT;  Laterality: N/A;    in for   Chief Complaint  Patient presents with  . Abdominal Pain     HPI  Russell Williamson  is a 59 y.o. male, with history of chronic alcoholic pancreatitis, tonsillar cancer, hypertension, presented to ED with complaints of epigastric pain for the last few days,Patient is known to have history of chronic pancreatitis, with most recent admission in November 2015, for acute on chronic pancreatitis, patient complains of nausea, abdominal pain, denies any vomiting, no fever, no chills, no diarrhea, no coffee-ground emesis, agent is a febrile NAD, with no leukocytosis, CT abdomen pelvis done, showing evidence of pancreatic pseudocysts,Patient denies any history of alcohol abuse, reportedly quit drinking 10 years ago, he takes oxycodone 15 mg every 6 hour at home without much relief, received total of IV Dilaudid 3, with minimal relief, so hospitalist requested to admit the patient, given his tonsillar cancer in the past patient had been fed through PEG, currently is discontinued, he is on clear liquid diet at baseline.    Review of Systems    In addition to the HPI above,  No Fever-chills, No Headache, No changes with Vision or hearing, No problems swallowing food or Liquids, No Chest pain, Cough or Shortness  of Breath, complaints ofnal pain, and nausea, but denies any vomiting No Blood in stool or Urine, No dysuria, No new skin rashes or bruises, No new joints pains-aches,  No new weakness, tingling, numbness in any extremity, No recent weight gain or loss, No polyuria, polydypsia or polyphagia, No significant Mental Stressors.  A full 10 point Review of Systems was done, except as stated above, all other Review of Systems were negative.   Social History History  Substance Use Topics  . Smoking status: Current Every Day Smoker -- 0.25 packs/day for 40 years    Types: Cigarettes  . Smokeless tobacco: Never Used      Comment: hx 1/2 -1 PPD  . Alcohol Use: No     Comment: 05/23/2012 hx alcohol abuse, quit ~ 2000     Family History Family History  Problem Relation Age of Onset  . COPD Mother     Prior to Admission medications   Medication Sig Start Date End Date Taking? Authorizing Provider  bisacodyl (DULCOLAX) 5 MG EC tablet Take 5 mg by mouth daily as needed for mild constipation or moderate constipation.   Yes Historical Provider, MD  Cholecalciferol (VITAMIN D PO) Take 1,000 mg by mouth daily.    Yes Historical Provider, MD  doxylamine, Sleep, (UNISOM) 25 MG tablet Take 75 mg by mouth at bedtime as needed for sleep (sleep).    Yes Historical Provider, MD  fentaNYL (DURAGESIC - DOSED MCG/HR) 50 MCG/HR Place 1 patch onto the skin every 3 (three) days.   Yes Historical Provider, MD  hydrOXYzine (VISTARIL) 25 MG capsule Take 25 mg by mouth at bedtime. 08/15/14  Yes Historical Provider, MD  levothyroxine (SYNTHROID, LEVOTHROID) 50 MCG tablet Take 50 mcg by mouth daily before breakfast.   Yes Historical Provider, MD  lipase/protease/amylase (CREON-12/PANCREASE) 12000 UNITS CPEP capsule Take 1 capsule by mouth 4 (four) times daily.   Yes Historical Provider, MD  metoprolol (LOPRESSOR) 50 MG tablet Take 50 mg by mouth 2 (two) times daily.   Yes Historical Provider, MD  Nutritional Supplements (ENSURE PO) Take 1 Can by mouth 3 (three) times daily.   Yes Historical Provider, MD  Nutritional Supplements (FEEDING SUPPLEMENT, OSMOLITE 1.5 CAL,) LIQD Take 237 mLs by mouth daily. Drinks 4 cans per day   Yes Historical Provider, MD  Omega-3 Fatty Acids (FISH OIL PO) Take 1 capsule by mouth daily.   Yes Historical Provider, MD  omeprazole (PRILOSEC) 40 MG capsule Take 40 mg by mouth daily.    Yes Historical Provider, MD  oxyCODONE (ROXICODONE) 15 MG immediate release tablet Take 15 mg by mouth every 4 (four) hours.    Yes Historical Provider, MD  temazepam (RESTORIL) 30 MG capsule Take 60 mg by mouth at bedtime.     Yes Historical Provider, MD    No Known Allergies  Physical Exam  Vitals  Blood pressure 134/78, pulse 73, temperature 97.6 F (36.4 C), temperature source Oral, resp. rate 16, height 5\' 6"  (1.676 m), weight 53.524 kg (118 lb), SpO2 100 %.   1. General  frail, thin-appearing maleing in bed in NAD,    2. Normal affect and insight, Not Suicidal or Homicidal, Awake Alert, Oriented X 3.  3. No F.N deficits, ALL C.Nerves Intact, Strength 5/5 all 4 extremities, Sensation intact all 4 extremities, Plantars down going.  4. Ears and Eyes appear Normal, Conjunctivae clear, PERRLA. Moist Oral Mucosa.  5. edentulous , No JVD, No cervical lymphadenopathy appriciated, No Carotid Bruits.  6. Symmetrical Chest  wall movement, Good air movement bilaterally, CTAB.  7. RRR, No Gallops, Rubs or Murmurs, No Parasternal Heave.  8. Positive Bowel Sounds, Abdomen Smild tenderness to palpation in epigastric area No organomegaly appriciated,No rebound -guarding or rigidity.  9.  No Cyanosis, Normal Skin Turgor, No Skin Rash or Bruise.  10. Good muscle tone,  joints appear normal , no effusions, Normal ROM.    Data Review  CBC  Recent Labs Lab 01/17/15 1005  WBC 3.3*  HGB 13.9  HCT 41.9  PLT 141*  MCV 102.9*  MCH 34.2*  MCHC 33.2  RDW 13.3  LYMPHSABS 0.9  MONOABS 0.6  EOSABS 0.1  BASOSABS 0.0   ------------------------------------------------------------------------------------------------------------------  Chemistries   Recent Labs Lab 01/17/15 1005  NA 139  K 3.5  CL 101  CO2 31  GLUCOSE 118*  BUN 12  CREATININE 0.65  CALCIUM 9.4  AST 31  ALT 19  ALKPHOS 88  BILITOT 0.5   ------------------------------------------------------------------------------------------------------------------ estimated creatinine clearance is 76.2 mL/min (by C-G formula based on Cr of  0.65). ------------------------------------------------------------------------------------------------------------------ No results for input(s): TSH, T4TOTAL, T3FREE, THYROIDAB in the last 72 hours.  Invalid input(s): FREET3   Coagulation profile No results for input(s): INR, PROTIME in the last 168 hours. ------------------------------------------------------------------------------------------------------------------- No results for input(s): DDIMER in the last 72 hours. -------------------------------------------------------------------------------------------------------------------  Cardiac Enzymes  Recent Labs Lab 01/17/15 1005  TROPONINI <0.03   ------------------------------------------------------------------------------------------------------------------ Invalid input(s): POCBNP   ---------------------------------------------------------------------------------------------------------------  Urinalysis    Component Value Date/Time   COLORURINE YELLOW 08/31/2014 2350   APPEARANCEUR CLOUDY* 08/31/2014 2350   LABSPEC 1.008 08/31/2014 2350   PHURINE 6.5 08/31/2014 2350   GLUCOSEU NEGATIVE 08/31/2014 2350   HGBUR NEGATIVE 08/31/2014 2350   BILIRUBINUR NEGATIVE 08/31/2014 2350   KETONESUR NEGATIVE 08/31/2014 2350   PROTEINUR NEGATIVE 08/31/2014 2350   UROBILINOGEN 0.2 08/31/2014 2350   NITRITE NEGATIVE 08/31/2014 2350   LEUKOCYTESUR NEGATIVE 08/31/2014 2350    ----------------------------------------------------------------------------------------------------------------  Imaging results:   Ct Abdomen Pelvis W Contrast  01/17/2015   CLINICAL DATA:  Abdominal pain x2 days, tonsillar cancer  EXAM: CT ABDOMEN AND PELVIS WITH CONTRAST  TECHNIQUE: Multidetector CT imaging of the abdomen and pelvis was performed using the standard protocol following bolus administration of intravenous contrast.  CONTRAST:  66mL OMNIPAQUE IOHEXOL 300 MG/ML  SOLN  COMPARISON:   08/31/2014  FINDINGS: Lower chest: Nodular scarring at the bilateral lung bases. Scarring versus atelectasis in the right middle lobe.  Hepatobiliary: Liver is within normal limits. No suspicious/enhancing hepatic lesions.  Status post cholecystectomy. No intrahepatic or extrahepatic ductal dilatation.  Pancreas: Sequela of prior/ chronic pancreatitis with parenchymal calcifications and calculi within the pancreatic duct (series 9/ image 12). Mild associated ductal dilatation in the pancreatic body (series 9/ image 10). Mild peripancreatic stranding along the pancreatic head/ uncinate process, although possibly chronic given the normal lipase.  Three peripancreatic fluid collections/pseudocysts in the porta hepatis (series 3/images 19-24), measuring up to 1.5 x 1.9 cm.  Spleen: Within normal limits.  Adrenals/Urinary Tract: Adrenal glands are within normal limits.  8 mm posterior right lower pole renal cyst. Left kidney is within normal limits. No hydronephrosis.  Bladder is within normal limits.  Stomach/Bowel: Stomach is within normal limits.  No evidence of bowel obstruction.  Normal appendix.  Vascular/Lymphatic: Atherosclerotic calcifications of the abdominal aorta and branch vessels.  No suspicious abdominopelvic lymphadenopathy.  Reproductive: Prostate is unremarkable.  Other: No abdominopelvic ascites.  Musculoskeletal: Degenerative changes of the visualized thoracolumbar spine.  IMPRESSION: Sequela of prior/chronic pancreatitis, with  three peripancreatic fluid collections/pseudocysts, as above. Mild peripancreatic stranding along the pancreatic head/ uncinate process is possibly chronic given the normal lipase.  No evidence of bowel obstruction.  Normal appendix.  Otherwise, no CT findings to account for the patient's abdominal pain.   Electronically Signed   By: Julian Hy M.D.   On: 01/17/2015 12:22       Assessment & Plan  Active Problems:   HTN (hypertension)   Hypothyroidism  (acquired)   Nicotine abuse   Relapsing chronic pancreatitis  abdominal  pain due to acute on chronic pancreatitis -  patient will be admitted to medical floor, will be placed on IV fluids, when necessary pain medication, will be kept nothing by mouth for bowel rest,. - We will try to advance from clear liquid diet tomorrow if pain is controlled. - Continue with pancrelipase  Hypertension  - Continue with metoprolol    hypothyroidism - Continue with Synthroid  Tobacco abuse - Patient was counseled at length, strongly encouraged to quit smoking especially with tonsillar cancer, will be started on nicotine patch.  DVT Prophylaxis Heparin -   AM Labs Ordered, also please review Full Orders  Family Communication: Admission, patients condition and plan of care including tests being ordered have been discussed with the patient  who indicate understanding and agree with the plan and Code Status.  Code Status full  Likely DC to home when stable  Condition GUARDED    Time spent in minutes : 55 minutes     Ameilia Rattan M.D on 01/17/2015 at 6:39 PM  Between 7am to 7pm - Pager - [phone]  After 7pm go to [email or web address] - password [email or web address]  And look for the night coverage person covering me after hours  [facility] Office  [phone]   **Disclaimer: This note may have been dictated with voice recognition software. Similar sounding words can inadvertently be transcribed and this note may contain transcription errors which may not have been corrected upon publication of note.**

## 2015-01-17 NOTE — ED Provider Notes (Signed)
CSN: [id number]     Arrival date & time 01/17/15  4696 History   First MD Initiated Contact with Patient 01/17/15 [phone]     Chief Complaint  Patient presents with  . Abdominal Pain     (Consider location/radiation/quality/duration/timing/severity/associated sxs/prior Treatment) HPI Comments: 2 day history of upper abdominal pain that is constant and gradually worsening. Is associated with nausea and decreased appetite. Denies vomiting. History of pancreatitis in the past but this pain feels different and his is more the right side. Patient is uncertain if he still has a gallbladder. He denies fever, chills, chest pain or shortness of breath. He denies any alcohol use. Denies any urinary symptoms. He's been taking oxycodone 15 mg every 6 hours without relief. Patient has a history of tonsillar cancer but is cancer free now as far as he knows. No chemotherapy or radiation. Multiple abdominal surgeries including G-tube. Placement status post removal, cholecystectomy as noted in medical history. States he has not had a bowel movement in 5 days which is normal for him.  The history is provided by the patient.    Past Medical History  Diagnosis Date  . Hypertension   . Chronic pancreatitis   . Arthritis   . Hx of radiation therapy 02/02/11 to 03/22/11    L tonsil  . Seizures     alcohol-related  . Cervical adenopathy 04/26/2012  . Urinary hesitancy   . Constipation   . History of blood transfusion ~ 2004    "from the pancreatitis"  . Daily headache 05/23/2012    "small ones"  . History of radiation therapy 02/02/2011-03/22/2011    head/neck,left tonsil  . G tube feedings     has a feeding tube in stomach  . Hypothyroidism 10/29/2013  . Thrush 11/02/2013  . 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 Pancytopenia 02/28/2014   Past Surgical History  Procedure Laterality Date  . Bile duct stent placement      hx of  . Radical neck  dissection  05/23/2012    w/mass excision  . Cholecystectomy  04/2005  . Tibia fracture surgery  1990's    left  . Fracture surgery    . Direct laryngoscopy  05/23/2012    Procedure: DIRECT LARYNGOSCOPY;  Surgeon: Rozetta Nunnery, MD;  Location: [place];  Service: ENT;  Laterality: N/A;  . Mass biopsy  05/23/2012    Procedure: NECK MASS BIOPSY;  Surgeon: Rozetta Nunnery, MD;  Location: [place];  Service: ENT;  Laterality: N/A;  . Radical neck dissection  05/23/2012    Procedure: RADICAL NECK DISSECTION;  Surgeon: Rozetta Nunnery, MD;  Location: [place];  Service: ENT;  Laterality: Left;  . Direct laryngoscopy N/A 03/23/2013    Procedure: DIRECT LARYNGOSCOPY;  Surgeon: Rozetta Nunnery, MD;  Location: [place];  Service: ENT;  Laterality: N/A;  . Esophageal dilation N/A 03/23/2013    Procedure: ESOPHAGEAL DILATION;  Surgeon: Rozetta Nunnery, MD;  Location: [place];  Service: ENT;  Laterality: N/A;   Family History  Problem Relation Age of Onset  . COPD Mother    History  Substance Use Topics  . Smoking status: Current Every Day Smoker -- 0.25 packs/day for 40 years    Types: Cigarettes  . Smokeless tobacco: Never Used     Comment: hx 1/2 -1 PPD  . Alcohol Use: No     Comment: 05/23/2012 hx  alcohol abuse, quit ~ 2000    Review of Systems  Constitutional: Positive for activity change and appetite change. Negative for fever.  HENT: Negative for congestion and rhinorrhea.   Respiratory: Negative for cough, chest tightness and shortness of breath.   Cardiovascular: Negative for chest pain.  Gastrointestinal: Positive for nausea, abdominal pain and constipation. Negative for vomiting.  Genitourinary: Negative for dysuria and hematuria.  Musculoskeletal: Negative for myalgias, back pain and arthralgias.  Skin: Negative for wound.  Neurological: Negative for dizziness, weakness and headaches.  A complete 10 system review of systems was  obtained and all systems are negative except as noted in the HPI and PMH.      Allergies  Review of patient's allergies indicates no known allergies.  Home Medications   Prior to Admission medications   Medication Sig Start Date End Date Taking? Authorizing Provider  bisacodyl (DULCOLAX) 5 MG EC tablet Take 5 mg by mouth daily as needed for mild constipation or moderate constipation.   Yes Historical Provider, MD  Cholecalciferol (VITAMIN D PO) Take 1,000 mg by mouth daily.    Yes Historical Provider, MD  doxylamine, Sleep, (UNISOM) 25 MG tablet Take 75 mg by mouth at bedtime as needed for sleep (sleep).    Yes Historical Provider, MD  fentaNYL (DURAGESIC - DOSED MCG/HR) 50 MCG/HR Place 1 patch onto the skin every 3 (three) days.   Yes Historical Provider, MD  hydrOXYzine (VISTARIL) 25 MG capsule Take 25 mg by mouth at bedtime. 08/15/14  Yes Historical Provider, MD  levothyroxine (SYNTHROID, LEVOTHROID) 50 MCG tablet Take 50 mcg by mouth daily before breakfast.   Yes Historical Provider, MD  lipase/protease/amylase (CREON-12/PANCREASE) 12000 UNITS CPEP capsule Take 1 capsule by mouth 4 (four) times daily.   Yes Historical Provider, MD  metoprolol (LOPRESSOR) 50 MG tablet Take 50 mg by mouth 2 (two) times daily.   Yes Historical Provider, MD  Nutritional Supplements (ENSURE PO) Take 1 Can by mouth 3 (three) times daily.   Yes Historical Provider, MD  Nutritional Supplements (FEEDING SUPPLEMENT, OSMOLITE 1.5 CAL,) LIQD Take 237 mLs by mouth daily. Drinks 4 cans per day   Yes Historical Provider, MD  Omega-3 Fatty Acids (FISH OIL PO) Take 1 capsule by mouth daily.   Yes Historical Provider, MD  omeprazole (PRILOSEC) 40 MG capsule Take 40 mg by mouth daily.    Yes Historical Provider, MD  oxyCODONE (ROXICODONE) 15 MG immediate release tablet Take 15 mg by mouth every 4 (four) hours.    Yes Historical Provider, MD  temazepam (RESTORIL) 30 MG capsule Take 60 mg by mouth at bedtime.    Yes  Historical Provider, MD   BP 159/80 mmHg  Pulse 51  Temp(Src) 97.6 F (36.4 C) (Oral)  Resp 16  SpO2 97% Physical Exam  Constitutional: He is oriented to person, place, and time. He appears well-developed and well-nourished. No distress.  HENT:  Head: Normocephalic and atraumatic.  Mouth/Throat: Oropharynx is clear and moist. No oropharyngeal exudate.  Eyes: Conjunctivae and EOM are normal. Pupils are equal, round, and reactive to light.  Neck: Normal range of motion. Neck supple.  No meningismus.  Cardiovascular: Normal rate, regular rhythm, normal heart sounds and intact distal pulses.   No murmur heard. Pulmonary/Chest: Effort normal and breath sounds normal. No respiratory distress.  Abdominal: Soft. There is tenderness. There is no rebound and no guarding.  Multiple surgical scars. Epigastric and right upper quadrant tenderness with guarding. Intact femoral pulses are equal  Musculoskeletal: Normal  range of motion. He exhibits no edema or tenderness.  Neurological: He is alert and oriented to person, place, and time. No cranial nerve deficit. He exhibits normal muscle tone. Coordination normal.  No ataxia on finger to nose bilaterally. No pronator drift. 5/5 strength throughout. CN 2-12 intact. Negative Romberg. Equal grip strength. Sensation intact. Gait is normal.   Skin: Skin is warm.  Psychiatric: He has a normal mood and affect. His behavior is normal.  Nursing note and vitals reviewed.   ED Course  Procedures (including critical care time) Labs Review Labs Reviewed  CBC WITH DIFFERENTIAL/PLATELET - Abnormal; Notable for the following:    WBC 3.3 (*)    RBC 4.07 (*)    MCV 102.9 (*)    MCH 34.2 (*)    Platelets 141 (*)    Monocytes Relative 17 (*)    All other components within normal limits  COMPREHENSIVE METABOLIC PANEL - Abnormal; Notable for the following:    Glucose, Bld 118 (*)    Total Protein 8.7 (*)    All other components within normal limits  LIPASE,  BLOOD  TROPONIN I    Imaging Review Ct Abdomen Pelvis W Contrast  01/17/2015   CLINICAL DATA:  Abdominal pain x2 days, tonsillar cancer  EXAM: CT ABDOMEN AND PELVIS WITH CONTRAST  TECHNIQUE: Multidetector CT imaging of the abdomen and pelvis was performed using the standard protocol following bolus administration of intravenous contrast.  CONTRAST:  34mL OMNIPAQUE IOHEXOL 300 MG/ML  SOLN  COMPARISON:  08/31/2014  FINDINGS: Lower chest: Nodular scarring at the bilateral lung bases. Scarring versus atelectasis in the right middle lobe.  Hepatobiliary: Liver is within normal limits. No suspicious/enhancing hepatic lesions.  Status post cholecystectomy. No intrahepatic or extrahepatic ductal dilatation.  Pancreas: Sequela of prior/ chronic pancreatitis with parenchymal calcifications and calculi within the pancreatic duct (series 9/ image 12). Mild associated ductal dilatation in the pancreatic body (series 9/ image 10). Mild peripancreatic stranding along the pancreatic head/ uncinate process, although possibly chronic given the normal lipase.  Three peripancreatic fluid collections/pseudocysts in the porta hepatis (series 3/images 19-24), measuring up to 1.5 x 1.9 cm.  Spleen: Within normal limits.  Adrenals/Urinary Tract: Adrenal glands are within normal limits.  8 mm posterior right lower pole renal cyst. Left kidney is within normal limits. No hydronephrosis.  Bladder is within normal limits.  Stomach/Bowel: Stomach is within normal limits.  No evidence of bowel obstruction.  Normal appendix.  Vascular/Lymphatic: Atherosclerotic calcifications of the abdominal aorta and branch vessels.  No suspicious abdominopelvic lymphadenopathy.  Reproductive: Prostate is unremarkable.  Other: No abdominopelvic ascites.  Musculoskeletal: Degenerative changes of the visualized thoracolumbar spine.  IMPRESSION: Sequela of prior/chronic pancreatitis, with three peripancreatic fluid collections/pseudocysts, as above. Mild  peripancreatic stranding along the pancreatic head/ uncinate process is possibly chronic given the normal lipase.  No evidence of bowel obstruction.  Normal appendix.  Otherwise, no CT findings to account for the patient's abdominal pain.   Electronically Signed   By: Julian Hy M.D.   On: 01/17/2015 12:22     EKG Interpretation   Date/Time:  Friday January 17 2015 10:11:00 EDT Ventricular Rate:  61 PR Interval:  172 QRS Duration: 62 QT Interval:  400 QTC Calculation: 403 R Axis:   77 Text Interpretation:  Sinus rhythm Anterior infarct, old No significant  change was found Confirmed by Wyvonnia Dusky  MD, Simeon Vera [phone]) on 01/17/2015  10:17:04 AM      MDM   Final diagnoses:  Other  chronic pancreatitis   Upper abdominal pain with nausea and constipation, atypical from previous episodes of pancreatitis.  NPO, IVFs, pain control.  Normal LFTs and lipase. X-ray shows no evidence of bowel obstruction. EKG is unchanged troponin is negative. Low suspicion for ACS.  Pain is uncontrolled after several doses of narcotic use in the ED. Patient does take chronic pain medication at home including fentanyl patch. He feels he should stay for further pain control. He's not had any vomiting in the ED but still feels nauseated.  CT shows some. Pancreatic stranding along the pancreas head. His lipase is normal however.  Admission discussed with hospitalist for further treatment.    Ezequiel Essex, MD 01/17/15 [phone]

## 2015-01-18 LAB — CBC
HCT: 35.2 % — ABNORMAL LOW (ref 39.0–52.0)
Hemoglobin: 11.4 g/dL — ABNORMAL LOW (ref 13.0–17.0)
MCH: 33.5 pg (ref 26.0–34.0)
MCHC: 32.4 g/dL (ref 30.0–36.0)
MCV: 103.5 fL — ABNORMAL HIGH (ref 78.0–100.0)
PLATELETS: 123 10*3/uL — AB (ref 150–400)
RBC: 3.4 MIL/uL — AB (ref 4.22–5.81)
RDW: 13.3 % (ref 11.5–15.5)
WBC: 3.2 10*3/uL — ABNORMAL LOW (ref 4.0–10.5)

## 2015-01-18 LAB — COMPREHENSIVE METABOLIC PANEL
ALT: 15 U/L (ref 0–53)
AST: 21 U/L (ref 0–37)
Albumin: 3.5 g/dL (ref 3.5–5.2)
Alkaline Phosphatase: 71 U/L (ref 39–117)
Anion gap: 7 (ref 5–15)
BUN: 8 mg/dL (ref 6–23)
CO2: 25 mmol/L (ref 19–32)
Calcium: 8.3 mg/dL — ABNORMAL LOW (ref 8.4–10.5)
Chloride: 107 mmol/L (ref 96–112)
Creatinine, Ser: 0.6 mg/dL (ref 0.50–1.35)
GFR calc non Af Amer: >90 mL/min (ref >90)
Glucose, Bld: 72 mg/dL (ref 70–99)
Potassium: 4.1 mmol/L (ref 3.5–5.1)
Sodium: 139 mmol/L (ref 135–145)
TOTAL PROTEIN: 6.5 g/dL (ref 6.0–8.3)
Total Bilirubin: 0.4 mg/dL (ref 0.3–1.2)

## 2015-01-18 LAB — MRSA PCR SCREENING: MRSA BY PCR: POSITIVE — AB

## 2015-01-18 MED ORDER — GUAIFENESIN-DM 100-10 MG/5ML PO SYRP
5.0000 mL | ORAL_SOLUTION | ORAL | Status: DC | PRN
  Administered 2015-01-18 – 2015-01-19 (×3): 5 mL via ORAL
  Filled 2015-01-18 (×3): qty 10

## 2015-01-18 MED ORDER — TEMAZEPAM 15 MG PO CAPS
30.0000 mg | ORAL_CAPSULE | Freq: Once | ORAL | Status: AC
  Administered 2015-01-18: 30 mg via ORAL
  Filled 2015-01-18: qty 2

## 2015-01-18 MED ORDER — CHLORHEXIDINE GLUCONATE CLOTH 2 % EX PADS
6.0000 | MEDICATED_PAD | Freq: Every day | CUTANEOUS | Status: DC
  Administered 2015-01-18 – 2015-01-19 (×2): 6 via TOPICAL

## 2015-01-18 MED ORDER — ENSURE ENLIVE PO LIQD
237.0000 mL | Freq: Three times a day (TID) | ORAL | Status: DC
  Administered 2015-01-18 – 2015-01-19 (×3): 237 mL via ORAL

## 2015-01-18 MED ORDER — POLYETHYLENE GLYCOL 3350 17 G PO PACK: 17.0000 g | PACK | Freq: Every day | ORAL | Status: DC | PRN

## 2015-01-18 MED ORDER — SENNOSIDES-DOCUSATE SODIUM 8.6-50 MG PO TABS
1.0000 | ORAL_TABLET | Freq: Two times a day (BID) | ORAL | Status: DC
  Administered 2015-01-18 – 2015-01-19 (×3): 1 via ORAL
  Filled 2015-01-18 (×4): qty 1

## 2015-01-18 MED ORDER — MUPIROCIN 2 % EX OINT
1.0000 "application " | TOPICAL_OINTMENT | Freq: Two times a day (BID) | CUTANEOUS | Status: DC
  Administered 2015-01-18 – 2015-01-19 (×4): 1 via NASAL
  Filled 2015-01-18: qty 22

## 2015-01-18 MED ORDER — MUPIROCIN 2 % EX OINT: 1.0000 "application " | TOPICAL_OINTMENT | Freq: Two times a day (BID) | CUTANEOUS | Status: DC

## 2015-01-18 MED ORDER — TEMAZEPAM 15 MG PO CAPS
60.0000 mg | ORAL_CAPSULE | Freq: Every evening | ORAL | Status: DC | PRN
  Administered 2015-01-18: 60 mg via ORAL
  Filled 2015-01-18: qty 4

## 2015-01-18 MED ORDER — POLYETHYLENE GLYCOL 3350 17 G PO PACK
17.0000 g | PACK | Freq: Two times a day (BID) | ORAL | Status: AC
  Administered 2015-01-18 (×2): 17 g via ORAL
  Filled 2015-01-18 (×2): qty 1

## 2015-01-18 NOTE — Progress Notes (Signed)
Patient Demographics  Russell Williamson, is a 59 y.o. male, DOB - 07/07/1956, XBD:[id number]  Admit date - 01/17/2015   Admitting Physician Albertine Patricia, MD  Outpatient Primary MD for the patient is Harvie Junior, MD  LOS - 1   Chief Complaint  Patient presents with  . Abdominal Pain      Admission history of present illness/brief narrative: Russell Williamson is a 59 y.o. male, with history of chronic alcoholic pancreatitis, tonsillar cancer, hypertension, presented to ED with complaints of epigastric pain for the last few days,Patient is known to have history of chronic pancreatitis, with most recent admission in November 2015, for acute on chronic pancreatitis, patient complains of nausea, abdominal pain, denies any vomiting, no fever, no chills, no diarrhea, no coffee-ground emesis, agent is a febrile NAD, with no leukocytosis, CT abdomen pelvis done, showing evidence of pancreatic pseudocysts,Patient denies any history of alcohol abuse, reportedly quit drinking 10 years ago, he takes oxycodone 15 mg every 6 hour at home without much relief, received total of IV Dilaudid 3, with minimal relief, so hospitalist requested to admit the patient, given his tonsillar cancer in the past patient had been fed through PEG, currently is discontinued, he is on full liquid diet at baseline given he is edentulous.  Subjective:   Russell Williamson today has, No headache, No chest pain,  - No Nausea, No new weakness tingling or numbness, No Cough - SOB. Still complaining of abdominal pain, ports constipation.  Assessment & Plan    Active Problems:   HTN (hypertension)   Hypothyroidism (acquired)   Nicotine abuse   Relapsing chronic pancreatitis  abdominal pain due to acute on chronic pancreatitis -Known history of alcoholic chronic pancreatitis - Continue with  pancrelipase - We'll start on clear liquid diet, with abdominal pain has improved, will advance to full liquids if tolerated ( his baseline) - Continue when necessary pain and nausea medication  Hypertension  - Continue with metoprolol   hypothyroidism - Continue with Synthroid  Tobacco abuse - Patient was counseled,on nicotine patch.  Code Status: Full  Family Communication: None at bedside  Disposition Plan: Home in 24 hours if tolerated diet and abdominal pain improves.   Procedures  None   Consults   None   Medications  Scheduled Meds: . Chlorhexidine Gluconate Cloth  6 each Topical Q0600  . cholecalciferol  1,000 Units Oral Daily  . feeding supplement (ENSURE ENLIVE)  237 mL Oral TID WC  . fentaNYL  50 mcg Transdermal Q72H  . heparin  5,000 Units Subcutaneous 3 times per day  . hydrOXYzine  25 mg Oral QHS  . levothyroxine  50 mcg Oral QAC breakfast  . lipase/protease/amylase  1 capsule Oral QID  . metoprolol  50 mg Oral BID  . mupirocin ointment  1 application Nasal BID  . nicotine  21 mg Transdermal Q24H  . pantoprazole  40 mg Oral Daily  . senna-docusate  1 tablet Oral BID   Continuous Infusions: . sodium chloride 75 mL/hr at 01/18/15 0940   PRN Meds:.albuterol, guaiFENesin-dextromethorphan, HYDROmorphone (DILAUDID) injection, ondansetron **OR** ondansetron (ZOFRAN) IV, polyethylene glycol  DVT Prophylaxis   - Heparin - Lab Results  Component Value Date   PLT 123* 01/18/2015    Antibiotics  Anti-infectives    None          Objective:   Filed Vitals:   01/17/15 1445 01/17/15 2021 01/18/15 0515 01/18/15 0940  BP: 134/78 121/69 119/69 132/67  Pulse: 73 51 63 60  Temp: 97.6 F (36.4 C) 98.5 F (36.9 C) 98.3 F (36.8 C)   TempSrc: Oral Oral Oral   Resp: 16 16 16    Height: 5\' 6"  (1.676 m)     Weight: 53.524 kg (118 lb)     SpO2: 100% 100% 95%     Wt Readings from Last 3 Encounters:  01/17/15 53.524 kg (118 lb)  10/18/14 55.339  kg (122 lb)  09/04/14 54.8 kg (120 lb 13 oz)     Intake/Output Summary (Last 24 hours) at 01/18/15 1304 Last data filed at 01/18/15 0738  Gross per 24 hour  Intake   1065 ml  Output    400 ml  Net    665 ml     Physical Exam  Awake Alert, Oriented X 3, No new F.N deficits, Normal affect [place].AT,PERRAL, edentulous No JVD, No cervical lymphadenopathy appriciated.  Symmetrical Chest wall movement, Good air movement bilaterally, CTAB RRR,No Gallops,Rubs or new Murmurs, No Parasternal Heave +ve B.Sounds, Abd Soft, mild tenderness of an epigastric area to palpation, No organomegaly appriciated, No rebound - guarding or rigidity. No Cyanosis, Clubbing or edema, No new Rash or bruise     Data Review   Micro Results Recent Results (from the past 240 hour(s))  MRSA PCR Screening     Status: Abnormal   Collection Time: 01/17/15  9:36 PM  Result Value Ref Range Status   MRSA by PCR POSITIVE (A) NEGATIVE Final    Comment:        The GeneXpert MRSA Assay (FDA approved for NASAL specimens only), is one component of a comprehensive MRSA colonization surveillance program. It is not intended to diagnose MRSA infection nor to guide or monitor treatment for MRSA infections. RESULT CALLED TO, READ BACK BY AND VERIFIED WITH: S PATTEN RN [phone] 01/18/15 A [facility]     Radiology Reports Ct Abdomen Pelvis W Contrast  01/17/2015   CLINICAL DATA:  Abdominal pain x2 days, tonsillar cancer  EXAM: CT ABDOMEN AND PELVIS WITH CONTRAST  TECHNIQUE: Multidetector CT imaging of the abdomen and pelvis was performed using the standard protocol following bolus administration of intravenous contrast.  CONTRAST:  29mL OMNIPAQUE IOHEXOL 300 MG/ML  SOLN  COMPARISON:  08/31/2014  FINDINGS: Lower chest: Nodular scarring at the bilateral lung bases. Scarring versus atelectasis in the right middle lobe.  Hepatobiliary: Liver is within normal limits. No suspicious/enhancing hepatic lesions.  Status post  cholecystectomy. No intrahepatic or extrahepatic ductal dilatation.  Pancreas: Sequela of prior/ chronic pancreatitis with parenchymal calcifications and calculi within the pancreatic duct (series 9/ image 12). Mild associated ductal dilatation in the pancreatic body (series 9/ image 10). Mild peripancreatic stranding along the pancreatic head/ uncinate process, although possibly chronic given the normal lipase.  Three peripancreatic fluid collections/pseudocysts in the porta hepatis (series 3/images 19-24), measuring up to 1.5 x 1.9 cm.  Spleen: Within normal limits.  Adrenals/Urinary Tract: Adrenal glands are within normal limits.  8 mm posterior right lower pole renal cyst. Left kidney is within normal limits. No hydronephrosis.  Bladder is within normal limits.  Stomach/Bowel: Stomach is within normal limits.  No evidence of bowel obstruction.  Normal appendix.  Vascular/Lymphatic: Atherosclerotic calcifications of the abdominal aorta and branch vessels.  No suspicious abdominopelvic lymphadenopathy.  Reproductive: Prostate is unremarkable.  Other: No abdominopelvic ascites.  Musculoskeletal: Degenerative changes of the visualized thoracolumbar spine.  IMPRESSION: Sequela of prior/chronic pancreatitis, with three peripancreatic fluid collections/pseudocysts, as above. Mild peripancreatic stranding along the pancreatic head/ uncinate process is possibly chronic given the normal lipase.  No evidence of bowel obstruction.  Normal appendix.  Otherwise, no CT findings to account for the patient's abdominal pain.   Electronically Signed   By: Julian Hy M.D.   On: 01/17/2015 12:22    CBC  Recent Labs Lab 01/17/15 1005 01/18/15 0419  WBC 3.3* 3.2*  HGB 13.9 11.4*  HCT 41.9 35.2*  PLT 141* 123*  MCV 102.9* 103.5*  MCH 34.2* 33.5  MCHC 33.2 32.4  RDW 13.3 13.3  LYMPHSABS 0.9  --   MONOABS 0.6  --   EOSABS 0.1  --   BASOSABS 0.0  --     Chemistries   Recent Labs Lab 01/17/15 1005  01/18/15 0419  NA 139 139  K 3.5 4.1  CL 101 107  CO2 31 25  GLUCOSE 118* 72  BUN 12 8  CREATININE 0.65 0.60  CALCIUM 9.4 8.3*  AST 31 21  ALT 19 15  ALKPHOS 88 71  BILITOT 0.5 0.4   ------------------------------------------------------------------------------------------------------------------ estimated creatinine clearance is 76.2 mL/min (by C-G formula based on Cr of 0.6). ------------------------------------------------------------------------------------------------------------------ No results for input(s): HGBA1C in the last 72 hours. ------------------------------------------------------------------------------------------------------------------ No results for input(s): CHOL, HDL, LDLCALC, TRIG, CHOLHDL, LDLDIRECT in the last 72 hours. ------------------------------------------------------------------------------------------------------------------ No results for input(s): TSH, T4TOTAL, T3FREE, THYROIDAB in the last 72 hours.  Invalid input(s): FREET3 ------------------------------------------------------------------------------------------------------------------ No results for input(s): VITAMINB12, FOLATE, FERRITIN, TIBC, IRON, RETICCTPCT in the last 72 hours.  Coagulation profile No results for input(s): INR, PROTIME in the last 168 hours.  No results for input(s): DDIMER in the last 72 hours.  Cardiac Enzymes  Recent Labs Lab 01/17/15 1005  TROPONINI <0.03   ------------------------------------------------------------------------------------------------------------------ Invalid input(s): POCBNP     Time Spent in minutes   30 minutes   Russell Williamson M.D on 01/18/2015 at 1:04 PM  Between 7am to 7pm - Pager - [phone]  After 7pm go to [email or web address] - password [email or web address]  And look for the night coverage person covering for me after hours  [facility] Office  [phone]   **Disclaimer: This note may have been dictated with voice  recognition software. Similar sounding words can inadvertently be transcribed and this note may contain transcription errors which may not have been corrected upon publication of note.**

## 2015-01-18 NOTE — Progress Notes (Signed)
Positive MRSA screen. Standing orders placed by this Probation officer and orders implemented. Noreene Larsson RN, BSN

## 2015-01-18 NOTE — Progress Notes (Signed)
Utilization Review Completed.Russell Williamson T4/16/2016  

## 2015-01-19 LAB — LIPASE, BLOOD: LIPASE: 15 U/L (ref 11–59)

## 2015-01-19 MED ORDER — NICOTINE 21 MG/24HR TD PT24: 21.0000 mg | MEDICATED_PATCH | TRANSDERMAL | Status: DC

## 2015-01-19 NOTE — Discharge Summary (Signed)
Russell Williamson, 59 y.o., DOB 1956-06-18, MRN [id number]. Admission date: 01/17/2015 Discharge Date 01/19/2015 Primary MD Harvie Junior, MD Admitting Physician Albertine Patricia, MD   PCP please follow on: - Check CBC, BMP during next visit.  Admission Diagnosis  Other chronic pancreatitis [K86.1]  Discharge Diagnosis   Active Problems:   HTN (hypertension)   Hypothyroidism (acquired)   Nicotine abuse   Relapsing chronic pancreatitis     Past Medical History  Diagnosis Date  . Hypertension   . Chronic pancreatitis   . Arthritis   . Hx of radiation therapy 02/02/11 to 03/22/11    L tonsil  . Seizures     alcohol-related  . Cervical adenopathy 04/26/2012  . Urinary hesitancy   . Constipation   . History of blood transfusion ~ 2004    "from the pancreatitis"  . Daily headache 05/23/2012    "small ones"  . History of radiation therapy 02/02/2011-03/22/2011    head/neck,left tonsil  . G tube feedings     has a feeding tube in stomach  . Hypothyroidism 10/29/2013  . Thrush 11/02/2013  . 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 Pancytopenia 02/28/2014    Past Surgical History  Procedure Laterality Date  . Bile duct stent placement      hx of  . Radical neck dissection  05/23/2012    w/mass excision  . Cholecystectomy  04/2005  . Tibia fracture surgery  1990's    left  . Fracture surgery    . Direct laryngoscopy  05/23/2012    Procedure: DIRECT LARYNGOSCOPY;  Surgeon: Rozetta Nunnery, MD;  Location: [place];  Service: ENT;  Laterality: N/A;  . Mass biopsy  05/23/2012    Procedure: NECK MASS BIOPSY;  Surgeon: Rozetta Nunnery, MD;  Location: [place];  Service: ENT;  Laterality: N/A;  . Radical neck dissection  05/23/2012    Procedure: RADICAL NECK DISSECTION;  Surgeon: Rozetta Nunnery, MD;  Location: [place];  Service: ENT;  Laterality: Left;  . Direct laryngoscopy N/A 03/23/2013    Procedure: DIRECT  LARYNGOSCOPY;  Surgeon: Rozetta Nunnery, MD;  Location: [place];  Service: ENT;  Laterality: N/A;  . Esophageal dilation N/A 03/23/2013    Procedure: ESOPHAGEAL DILATION;  Surgeon: Rozetta Nunnery, MD;  Location: [place];  Service: ENT;  Laterality: N/A;     Hospital Course See H&P, Labs, Consult and Test reports for all details in brief, patient was admitted for **  Active Problems:   HTN (hypertension)   Hypothyroidism (acquired)   Nicotine abuse   Relapsing chronic pancreatitis  Admission history of present illness/brief narrative: Russell Williamson is a 59 y.o. male, with history of chronic alcoholic pancreatitis, tonsillar cancer, hypertension, presented to ED with complaints of epigastric pain for the last few days,Patient is known to have history of chronic pancreatitis, with most recent admission in November 2015, for acute on chronic pancreatitis, patient complains of nausea, abdominal pain, denies any vomiting, no fever, no chills, no diarrhea, no coffee-ground emesis, agent is a febrile NAD, with no leukocytosis, CT abdomen pelvis done, showing evidence of pancreatic pseudocysts,Patient denies any history of alcohol abuse, reportedly quit drinking 10 years ago, he takes oxycodone 15 mg every 6 hour at home without much relief, received total of IV Dilaudid 3, with minimal relief, so hospitalist requested to admit the patient, given his tonsillar cancer in the past patient  had been fed through PEG, currently is discontinued, he is on full liquid diet at baseline given he is edentulous.  abdominal pain due to acute on chronic pancreatitis -Known history of alcoholic chronic pancreatitis - Continue with pancrelipase on discharge - Started on clear liquid diet, tolerated very well, then advanced to pured diet which he tolerated well.  -And be discharged home on his previous pain medication regimen .  Hypertension  - Continue with  metoprolol   hypothyroidism - Continue with Synthroid  Tobacco abuse - Patient was counseled,on nicotine patch.  Consults  None  Significant Tests:  See full reports for all details    Dg Chest 2 View  12/27/2014   CLINICAL DATA:  Hemoptysis, neck pain  EXAM: CHEST  2 VIEW  COMPARISON:  10/31/2013  FINDINGS: Cardiomediastinal silhouette is stable. Mild hyperinflation. No acute infiltrate or pleural effusion. No pulmonary edema. Bony thorax is unremarkable.  IMPRESSION: No active cardiopulmonary disease.  Mild hyperinflation.   Electronically Signed   By: Lahoma Crocker M.D.   On: 12/27/2014 21:09   Ct Soft Tissue Neck W Contrast  12/27/2014   CLINICAL DATA:  History of tonsillar cancer. Pain in left side of face radiating in left side of neck and mandible. Hemoptysis.  EXAM: CT NECK WITH CONTRAST  TECHNIQUE: Multidetector CT imaging of the neck was performed using the standard protocol following the bolus administration of intravenous contrast.  CONTRAST:  123mL OMNIPAQUE IOHEXOL 300 MG/ML  SOLN  COMPARISON:  01/25/2014  FINDINGS: Pharynx and larynx: Unremarkable. Epiglottis and aryepiglottic folds are normal. No tonsillar mass.  Salivary glands: Unremarkable.  Thyroid: Normal.  Lymph nodes: No cervical adenopathy.  Vascular: Prior left jugular resection as part of left radical neck dissection. Severe stenosis of the proximal right common carotid artery again noted, stable. Calcifications in the carotid bulbs bilaterally without visible significant stenosis.  Limited intracranial: Unremarkable.  Visualized orbits: Unremarkable.  Mastoids and visualized paranasal sinuses: Mucosal thickening and air-fluid level noted in the left maxillary sinus, stable. Remainder of the paranasal sinuses are clear. Mastoid air cells are clear.  Skeleton: No acute bony abnormality or focal bone lesion. Anterior spurring at C6-7. Normal alignment.  Upper chest: No focal airspace opacity.  Other: Changes of left radical neck  dissection again noted, stable. , left greater than right, likely related to postradiation changes. This is stable. Skin thickening noted bilaterally  IMPRESSION: Postoperative and postradiation changes within the left. No sign of recurrent disease/tumor.  Acute on chronic left maxillary sinusitis changes, stable.  Stable severe stenosis of the proximal right common carotid artery.   Electronically Signed   By: Rolm Baptise M.D.   On: 12/27/2014 21:15   Ct Abdomen Pelvis W Contrast  01/17/2015   CLINICAL DATA:  Abdominal pain x2 days, tonsillar cancer  EXAM: CT ABDOMEN AND PELVIS WITH CONTRAST  TECHNIQUE: Multidetector CT imaging of the abdomen and pelvis was performed using the standard protocol following bolus administration of intravenous contrast.  CONTRAST:  68mL OMNIPAQUE IOHEXOL 300 MG/ML  SOLN  COMPARISON:  08/31/2014  FINDINGS: Lower chest: Nodular scarring at the bilateral lung bases. Scarring versus atelectasis in the right middle lobe.  Hepatobiliary: Liver is within normal limits. No suspicious/enhancing hepatic lesions.  Status post cholecystectomy. No intrahepatic or extrahepatic ductal dilatation.  Pancreas: Sequela of prior/ chronic pancreatitis with parenchymal calcifications and calculi within the pancreatic duct (series 9/ image 12). Mild associated ductal dilatation in the pancreatic body (series 9/ image 10). Mild peripancreatic stranding along  the pancreatic head/ uncinate process, although possibly chronic given the normal lipase.  Three peripancreatic fluid collections/pseudocysts in the porta hepatis (series 3/images 19-24), measuring up to 1.5 x 1.9 cm.  Spleen: Within normal limits.  Adrenals/Urinary Tract: Adrenal glands are within normal limits.  8 mm posterior right lower pole renal cyst. Left kidney is within normal limits. No hydronephrosis.  Bladder is within normal limits.  Stomach/Bowel: Stomach is within normal limits.  No evidence of bowel obstruction.  Normal appendix.   Vascular/Lymphatic: Atherosclerotic calcifications of the abdominal aorta and branch vessels.  No suspicious abdominopelvic lymphadenopathy.  Reproductive: Prostate is unremarkable.  Other: No abdominopelvic ascites.  Musculoskeletal: Degenerative changes of the visualized thoracolumbar spine.  IMPRESSION: Sequela of prior/chronic pancreatitis, with three peripancreatic fluid collections/pseudocysts, as above. Mild peripancreatic stranding along the pancreatic head/ uncinate process is possibly chronic given the normal lipase.  No evidence of bowel obstruction.  Normal appendix.  Otherwise, no CT findings to account for the patient's abdominal pain.   Electronically Signed   By: Julian Hy M.D.   On: 01/17/2015 12:22     Today   Subjective:   Russell Williamson today has no headache,no chest pain,no new weakness tingling or numbness, denies any further abdominal pain, no nausea, but diet very well ,feels much better wants to go home today.   Objective:   Blood pressure 130/81, pulse 68, temperature 98.2 F (36.8 C), temperature source Oral, resp. rate 16, height 5\' 6"  (1.676 m), weight 53.524 kg (118 lb), SpO2 95 %.  Intake/Output Summary (Last 24 hours) at 01/19/15 1105 Last data filed at 01/19/15 0800  Gross per 24 hour  Intake   2750 ml  Output   1885 ml  Net    865 ml    Exam Awake Alert, Oriented X 3, No new F.N deficits, Normal affect [place].AT,PERRAL, edentulous No JVD, No cervical lymphadenopathy appriciated.  Symmetrical Chest wall movement, Good air movement bilaterally, CTAB RRR,No Gallops,Rubs or new Murmurs, No Parasternal Heave +ve B.Sounds, Abd Soft, no tenderness to palpation, No organomegaly appriciated, No rebound - guarding or rigidity. No Cyanosis, Clubbing or edema, No new Rash or bruise  Data Review   Cultures -   CBC w Diff: Lab Results  Component Value Date   WBC 3.2* 01/18/2015   WBC 4.1 10/17/2014   HGB 11.4* 01/18/2015   HGB 13.1 10/17/2014    HCT 35.2* 01/18/2015   HCT 39.6 10/17/2014   PLT 123* 01/18/2015   PLT 124* 10/17/2014   LYMPHOPCT 27 01/17/2015   LYMPHOPCT 22.7 10/17/2014   MONOPCT 17* 01/17/2015   MONOPCT 18.0* 10/17/2014   EOSPCT 4 01/17/2015   EOSPCT 1.0 10/17/2014   BASOPCT 0 01/17/2015   BASOPCT 0.2 10/17/2014   CMP: Lab Results  Component Value Date   NA 139 01/18/2015   NA 138 10/17/2014   NA 140 04/24/2012   K 4.1 01/18/2015   K 4.0 10/17/2014   K 4.1 04/24/2012   CL 107 01/18/2015   CL 102 02/27/2013   CL 98 04/24/2012   CO2 25 01/18/2015   CO2 26 10/17/2014   CO2 30 04/24/2012   BUN 8 01/18/2015   BUN 7.9 10/17/2014   BUN 13 04/24/2012   CREATININE 0.60 01/18/2015   CREATININE 0.8 10/17/2014   CREATININE 0.6 04/24/2012   PROT 6.5 01/18/2015   PROT 8.2 10/17/2014   PROT 8.1 04/24/2012   ALBUMIN 3.5 01/18/2015   ALBUMIN 4.5 10/17/2014   BILITOT 0.4 01/18/2015   BILITOT  0.58 10/17/2014   BILITOT 0.40 04/24/2012   ALKPHOS 71 01/18/2015   ALKPHOS 92 10/17/2014   ALKPHOS 77 04/24/2012   AST 21 01/18/2015   AST 26 10/17/2014   AST 30 04/24/2012   ALT 15 01/18/2015   ALT 14 10/17/2014   ALT 27 04/24/2012  .  Micro Results Recent Results (from the past 240 hour(s))  MRSA PCR Screening     Status: Abnormal   Collection Time: 01/17/15  9:36 PM  Result Value Ref Range Status   MRSA by PCR POSITIVE (A) NEGATIVE Final    Comment:        The GeneXpert MRSA Assay (FDA approved for NASAL specimens only), is one component of a comprehensive MRSA colonization surveillance program. It is not intended to diagnose MRSA infection nor to guide or monitor treatment for MRSA infections. RESULT CALLED TO, READ BACK BY AND VERIFIED WITH: Oretha Milch RN 0023 01/18/15 A [facility]      Discharge Instructions      Follow-up Information    Follow up with Harvie Junior, MD. Schedule an appointment as soon as possible for a visit in 1 week.   Specialty:  Specialist   Why:  Posthospitalization  follow-up   Contact information:   [place] [id number] [phone]       Discharge Medications     Medication List    TAKE these medications        bisacodyl 5 MG EC tablet  Commonly known as:  DULCOLAX  Take 5 mg by mouth daily as needed for mild constipation or moderate constipation.     doxylamine (Sleep) 25 MG tablet  Commonly known as:  UNISOM  Take 75 mg by mouth at bedtime as needed for sleep (sleep).     feeding supplement (OSMOLITE 1.5 CAL) Liqd  Take 237 mLs by mouth daily. Drinks 4 cans per day     ENSURE PO  Take 1 Can by mouth 3 (three) times daily.     fentaNYL 50 MCG/HR  Commonly known as:  DURAGESIC - dosed mcg/hr  Place 1 patch onto the skin every 3 (three) days.     FISH OIL PO  Take 1 capsule by mouth daily.     hydrOXYzine 25 MG capsule  Commonly known as:  VISTARIL  Take 25 mg by mouth at bedtime.     levothyroxine 50 MCG tablet  Commonly known as:  SYNTHROID, LEVOTHROID  Take 50 mcg by mouth daily before breakfast.     lipase/protease/amylase 12000 UNITS Cpep capsule  Commonly known as:  CREON  Take 1 capsule by mouth 4 (four) times daily.     metoprolol 50 MG tablet  Commonly known as:  LOPRESSOR  Take 50 mg by mouth 2 (two) times daily.     nicotine 21 mg/24hr patch  Commonly known as:  NICODERM CQ - dosed in mg/24 hours  Place 1 patch (21 mg total) onto the skin daily.     omeprazole 40 MG capsule  Commonly known as:  PRILOSEC  Take 40 mg by mouth daily.     oxyCODONE 15 MG immediate release tablet  Commonly known as:  ROXICODONE  Take 15 mg by mouth every 4 (four) hours.     temazepam 30 MG capsule  Commonly known as:  RESTORIL  Take 60 mg by mouth at bedtime.     VITAMIN D PO  Take 1,000 mg by mouth daily.  Total Time in preparing paper work, data evaluation and todays exam - 35 minutes  Dian Minahan M.D on 01/19/2015 at 11:05 AM  [facility] Office  [phone]

## 2015-01-19 NOTE — Discharge Instructions (Signed)
Follow with Primary MD Harvie Junior, MD in 7 days   Get CBC, CMP, 2 view Chest X ray checked  by Primary MD next visit.    Activity: As tolerated with Full fall precautions use walker/cane & assistance as needed   Disposition Home    Diet: On full liquid diet( given he is edentulous) , with feeding assistance and aspiration precautions.  For Heart failure patients - Check your Weight same time everyday, if you gain over 2 pounds, or you develop in leg swelling, experience more shortness of breath or chest pain, call your Primary MD immediately. Follow Cardiac Low Salt Diet and 1.5 lit/day fluid restriction.   On your next visit with your primary care physician please Get Medicines reviewed and adjusted.   Please request your Prim.MD to go over all Hospital Tests and Procedure/Radiological results at the follow up, please get all Hospital records sent to your Prim MD by signing hospital release before you go home.   If you experience worsening of your admission symptoms, develop shortness of breath, life threatening emergency, suicidal or homicidal thoughts you must seek medical attention immediately by calling 911 or calling your MD immediately  if symptoms less severe.  You Must read complete instructions/literature along with all the possible adverse reactions/side effects for all the Medicines you take and that have been prescribed to you. Take any new Medicines after you have completely understood and accpet all the possible adverse reactions/side effects.   Do not drive, operating heavy machinery, perform activities at heights, swimming or participation in water activities or provide baby sitting services if your were admitted for syncope or siezures until you have seen by Primary MD or a Neurologist and advised to do so again.  Do not drive when taking Pain medications.    Do not take more than prescribed Pain, Sleep and Anxiety Medications  Special Instructions: If you  have smoked or chewed Tobacco  in the last 2 yrs please stop smoking, stop any regular Alcohol  and or any Recreational drug use.  Wear Seat belts while driving.   Please note  You were cared for by a hospitalist during your hospital stay. If you have any questions about your discharge medications or the care you received while you were in the hospital after you are discharged, you can call the unit and asked to speak with the hospitalist on call if the hospitalist that took care of you is not available. Once you are discharged, your primary care physician will handle any further medical issues. Please note that NO REFILLS for any discharge medications will be authorized once you are discharged, as it is imperative that you return to your primary care physician (or establish a relationship with a primary care physician if you do not have one) for your aftercare needs so that they can reassess your need for medications and monitor your lab values.

## 2015-01-22 ENCOUNTER — Encounter (HOSPITAL_COMMUNITY): Payer: Self-pay | Admitting: Emergency Medicine

## 2015-01-22 ENCOUNTER — Emergency Department (HOSPITAL_COMMUNITY)
Admission: EM | Admit: 2015-01-22 | Discharge: 2015-01-22 | Discharge status: Against Medical Advice | Payer: Medicaid Other | Attending: Emergency Medicine | Admitting: Emergency Medicine

## 2015-01-22 DIAGNOSIS — I10 Essential (primary) hypertension: Secondary | ICD-10-CM | POA: Diagnosis not present

## 2015-01-22 DIAGNOSIS — K859 Acute pancreatitis, unspecified: Secondary | ICD-10-CM | POA: Diagnosis not present

## 2015-01-22 DIAGNOSIS — Z720 Tobacco use: Secondary | ICD-10-CM | POA: Insufficient documentation

## 2015-01-22 LAB — BASIC METABOLIC PANEL
ANION GAP: 11 (ref 5–15)
BUN: 7 mg/dL (ref 6–23)
CHLORIDE: 101 mmol/L (ref 96–112)
CO2: 27 mmol/L (ref 19–32)
Calcium: 9.7 mg/dL (ref 8.4–10.5)
Creatinine, Ser: 0.71 mg/dL (ref 0.50–1.35)
GFR calc Af Amer: >90 mL/min (ref >90)
GFR calc non Af Amer: >90 mL/min (ref >90)
GLUCOSE: 115 mg/dL — AB (ref 70–99)
Potassium: 4.1 mmol/L (ref 3.5–5.1)
SODIUM: 139 mmol/L (ref 135–145)

## 2015-01-22 LAB — CBC WITH DIFFERENTIAL/PLATELET
BASOS ABS: 0 10*3/uL (ref 0.0–0.1)
Basophils Relative: 0 % (ref 0–1)
EOS ABS: 0.1 10*3/uL (ref 0.0–0.7)
EOS PCT: 2 % (ref 0–5)
HCT: 39.1 % (ref 39.0–52.0)
Hemoglobin: 13.4 g/dL (ref 13.0–17.0)
Lymphocytes Relative: 27 % (ref 12–46)
Lymphs Abs: 0.8 10*3/uL (ref 0.7–4.0)
MCH: 34.3 pg — AB (ref 26.0–34.0)
MCHC: 34.3 g/dL (ref 30.0–36.0)
MCV: 100 fL (ref 78.0–100.0)
Monocytes Absolute: 0.6 10*3/uL (ref 0.1–1.0)
Monocytes Relative: 19 % — ABNORMAL HIGH (ref 3–12)
NEUTROS PCT: 52 % (ref 43–77)
Neutro Abs: 1.6 10*3/uL — ABNORMAL LOW (ref 1.7–7.7)
PLATELETS: 140 10*3/uL — AB (ref 150–400)
RBC: 3.91 MIL/uL — ABNORMAL LOW (ref 4.22–5.81)
RDW: 13 % (ref 11.5–15.5)
WBC: 3.1 10*3/uL — ABNORMAL LOW (ref 4.0–10.5)

## 2015-01-22 LAB — LIPASE, BLOOD: Lipase: 30 U/L (ref 11–59)

## 2015-01-22 NOTE — ED Notes (Signed)
I attempted to get blood and was unsuccessful

## 2015-01-22 NOTE — ED Notes (Signed)
Per pt, states was here last week for same symptoms-states pain never went away-states he does not have GI doctor

## 2015-01-23 ENCOUNTER — Telehealth: Payer: Self-pay | Admitting: *Deleted

## 2015-01-23 NOTE — Telephone Encounter (Signed)
He was noted to have pancreatitis, no pancreatic ca He needs Gi follow-up. I recommend follow w PCP first who can refer

## 2015-01-23 NOTE — Telephone Encounter (Signed)
Received call from sister [employer] ( on release of information ) wanting to know who is treating pt for pancreatic cancer.Marland Kitchen  Spoke with pt and was informed that pt went to ER on 4/15 and was admitted for 2 days for abdominal pain.  Pt then went back to ER yesterday for the same problem abdominal pain.  Pt wanted to know if he should see Dr. Alvy Bimler sooner for increased abdominal pain. Both pt and sister think pt has pancreatic cancer. 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 Message sent to Dr. Alvy Bimler and Jill Alexanders, desk nurse today. Pt's  Phone    [phone]. [employer]   Phone    [phone].

## 2015-01-23 NOTE — Telephone Encounter (Signed)
Spoke with sister Earlie Server and informed her re:  Per Dr. Alvy Bimler, pt does not have pancreatic cancer.  Pt needs to follow up with his PCP first , and if needed, PCP can refer pt to GI consult.  Dorothy voiced understanding.

## 2015-02-12 ENCOUNTER — Encounter (HOSPITAL_COMMUNITY): Payer: Self-pay | Admitting: Dentistry

## 2015-02-12 ENCOUNTER — Ambulatory Visit (HOSPITAL_COMMUNITY): Payer: Medicaid - Dental | Admitting: Dentistry

## 2015-02-12 VITALS — BP 131/69 | HR 68 | Temp 98.5°F

## 2015-02-12 DIAGNOSIS — K08109 Complete loss of teeth, unspecified cause, unspecified class: Secondary | ICD-10-CM

## 2015-02-12 DIAGNOSIS — Y842 Radiological procedure and radiotherapy as the cause of abnormal reaction of the patient, or of later complication, without mention of misadventure at the time of the procedure: Principal | ICD-10-CM

## 2015-02-12 DIAGNOSIS — R682 Dry mouth, unspecified: Secondary | ICD-10-CM

## 2015-02-12 DIAGNOSIS — K861 Other chronic pancreatitis: Secondary | ICD-10-CM

## 2015-02-12 DIAGNOSIS — Z923 Personal history of irradiation: Secondary | ICD-10-CM

## 2015-02-12 DIAGNOSIS — K0820 Unspecified atrophy of edentulous alveolar ridge: Secondary | ICD-10-CM

## 2015-02-12 DIAGNOSIS — Z972 Presence of dental prosthetic device (complete) (partial): Secondary | ICD-10-CM

## 2015-02-12 DIAGNOSIS — M272 Inflammatory conditions of jaws: Secondary | ICD-10-CM

## 2015-02-12 DIAGNOSIS — C099 Malignant neoplasm of tonsil, unspecified: Secondary | ICD-10-CM

## 2015-02-12 DIAGNOSIS — R252 Cramp and spasm: Secondary | ICD-10-CM

## 2015-02-12 DIAGNOSIS — K117 Disturbances of salivary secretion: Secondary | ICD-10-CM

## 2015-02-12 DIAGNOSIS — K000 Anodontia: Secondary | ICD-10-CM

## 2015-02-12 MED ORDER — CHLORHEXIDINE GLUCONATE 0.12 % MT SOLN: OROMUCOSAL | Status: DC

## 2015-02-12 NOTE — Progress Notes (Addendum)
02/12/2015   Patient:            Russell Williamson Date of Birth:  [birth year] MRN:                [id number]  BP 131/69 mmHg  Pulse 68  Temp(Src) 98.5 F (36.9 C) (Oral)   Patient Active Problem List   Diagnosis Date Noted  . Relapsing chronic pancreatitis 01/17/2015  . Diarrhea 09/04/2014  . 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 Malnutrition of moderate degree 09/03/2014  . Hypokalemia 09/02/2014  . Pancreatitis 09/01/2014  . Chronic alcoholic pancreatitis 82/70/7867  . Mucositis 06/17/2014  . Preventive measure 06/17/2014  . Chronic neck pain 06/17/2014  . S/P gastrostomy 06/17/2014  . Nicotine abuse 02/28/2014  . Pancytopenia 02/28/2014  . Hypothyroidism (acquired) 11/02/2013  . Hypothyroidism 10/29/2013  . 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 Cervical adenopathy 04/26/2012  . Hiatal hernia   . Hx of radiation therapy   . HTN (hypertension) 10/16/2011  . 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 Chronic pancreatitis    Past Medical History  Diagnosis Date  . Hypertension   . Chronic pancreatitis   . Arthritis   . Hx of radiation therapy 02/02/11 to 03/22/11    L tonsil  . Seizures     alcohol-related  . Cervical adenopathy 04/26/2012  . Urinary hesitancy   . Constipation   . History of blood transfusion ~ 2004    "from the pancreatitis"  . Daily headache 05/23/2012    "small ones"  . History of radiation therapy 02/02/2011-03/22/2011    head/neck,left tonsil  . G tube feedings     has a feeding tube in stomach  . Hypothyroidism 10/29/2013  . Thrush 11/02/2013  . 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 Pancytopenia 02/28/2014   No Known Allergies   Current Outpatient Prescriptions  Medication Sig Dispense Refill  . bisacodyl (DULCOLAX) 5 MG EC tablet Take 5 mg by mouth daily as needed for mild constipation or moderate constipation.    . chlorhexidine (PERIDEX) 0.12 % solution Rinse with 15 mls three times daily for 30 seconds  each. Use after breakfast, dinner, and at bedtime. Spit out excess. Do not swallow. 1440 mL prn  . Cholecalciferol (VITAMIN D PO) Take 1,000 mg by mouth daily.     Marland Kitchen doxylamine, Sleep, (UNISOM) 25 MG tablet Take 75 mg by mouth at bedtime as needed for sleep (sleep).     . fentaNYL (DURAGESIC - DOSED MCG/HR) 50 MCG/HR Place 1 patch onto the skin every 3 (three) days.    . hydrOXYzine (VISTARIL) 25 MG capsule Take 25 mg by mouth at bedtime.  5  . levothyroxine (SYNTHROID, LEVOTHROID) 50 MCG tablet Take 50 mcg by mouth daily before breakfast.    . lipase/protease/amylase (CREON-12/PANCREASE) 12000 UNITS CPEP capsule Take 1 capsule by mouth 4 (four) times daily.    . metoprolol (LOPRESSOR) 50 MG tablet Take 50 mg by mouth 2 (two) times daily.    . nicotine (NICODERM CQ - DOSED IN MG/24 HOURS) 21 mg/24hr patch Place 1 patch (21 mg total) onto the skin daily. 28 patch 1  . Nutritional Supplements (ENSURE PO) Take 1 Can by mouth 3 (three) times daily.    . Nutritional Supplements (FEEDING SUPPLEMENT, OSMOLITE 1.5 CAL,) LIQD Take 237 mLs by mouth daily. Drinks 4 cans per day    . Omega-3 Fatty Acids (FISH OIL PO) Take 1 capsule by mouth daily.    Marland Kitchen  omeprazole (PRILOSEC) 40 MG capsule Take 40 mg by mouth daily.     Marland Kitchen oxyCODONE (ROXICODONE) 15 MG immediate release tablet Take 15 mg by mouth every 4 (four) hours.     . temazepam (RESTORIL) 30 MG capsule Take 60 mg by mouth at bedtime.      No current facility-administered medications for this visit.   CC: Recall Exam for dentures. HPI:  Russell Williamson presents for oral examination and evaluation of upper and lower complete dentures. Patient was last seen in April 2015 for reevaluation of exposed bone of the upper right quadrant. The exposed bone had resolved at that appointment and patient was scheduled for follow-up examination at that time. Patient has significant difficulty and wearing upper and lower dentures at the same time. Patient usually only  uses the upper denture. Patient has had problems with swallowing and has difficulty and in maintaining proper nutrition. Patient does use ensure plus and other supplements as needed. Patient does use a soft diet and is able to swallow appropriately. Patient has been recently hospitalized for relapsing chronic pancreatitis. Patient indicates that he is smoking only one cigarette a day and is using the nicotine patches to help him quit completely.  Exam: General: The patient is a well-developed, slightly built male in no acute distress. Vitals: BP 131/69 mmHg  Pulse 68  Temp(Src) 98.5 F (36.9 C) (Oral) Head and neck: There is no palpable lymphadenopathy. Patient has limited opening measured at 23 mm today. Patient does have some trismus symptoms. Patient indicates that he does not perform trismus exercises on a daily basis. Intraoral Exam: Patient is edentulous. There is atrophy of the edentulous alveolar ridges. Patient has severe xerostomia. There is exposed bone involving the lower left lingual alveolar bone in the area of number 18-19. The bone in this area is starting to sequester away. Patient denies significant discomfort in this area. Patient does not use the lower denture by report to Prosthodontic: The upper and lower dentures have less than ideal retention and stability. The patient is unable to get the upper lower denture in at the same time due to a reduced vertical dimension and decreased maximum interincisal opening of 23 mm. This is decreased from the 30 mm noted at examination in April 2015. Pressure indicating Place was applied to the upper lower dentures separately. Denture adjustments were made as indicated. Dentures were then polished. Patient is to keep the lower denture out until lower left lingual heels appropriately. Patient will be informed of the ability to start using lower denture again in future appointments although ideally he may now longer be able to wear the lower denture  due to loss of vertical dimension. Occlusion. Patient has a poor occlusal scheme and malocclusion at this time.  Assessments: 1. Exposed bone of the lower left lingual alveolar ridge now measuring approximately 18 mm x 7 mm. This represents osteoradionecrosis. 2. Severe Xerostomia 3. Atrophy of edentulous alveolar ridge 4. The patient is edentulous. 5. Trismus with decreased maximum interincisal opening now measured at 23 mm. 6. Inability to wear upper lower complete dentures at this time due to decreased maximum interincisal opening.  Plan/recommendations: 1.  I discussed the risks, benefits, and complications of various treatment options with the patient in relationship to his medical and dental conditions. We discussed no treatment, partial ostectomy procedure today, referral to an oral surgeon, referral to a prosthodontist, and use of Trental/vitamin E therapy. The patient currently wishes to proceed with the partial ostectomy procedure today with  the use of chlorhexidine rinses 3 times a day to assist in the disinfection of the oral cavity. A prescription was forwarded to [facility] today with prn refills. The patient refused referral to an oral surgeon, refused referral to the prosthodontist, and refuse the Trental/vitamin E therapy due to the potential risk for stomach upset. Patient did agree to use the chlorhexidine rinses again as prescribed. The patient is to NOT to use the lower denture until further healing and reevaluation at his next dental appointment as scheduled. 2. Patient also encouraged to stop smoking completely. 3. Patient was instructed to continue trismus exercises as per physical therapy. 4. Return to clinic as scheduled. Patient is to call if problems worsen.  Procedure: Topical local anesthetic was applied to the lower left lingual alveolar ridge and area of exposed bone. The exposed bone was then carefully removed as a sequestrum with a rongeur, curette, and  bone file. The area was then irrigated with copious amounts of sterile saline. Patient then had a chlorhexidine rinse for 30 seconds. Heme was noted. The remaining area will be allowed to sequester with further evaluation for sequestrectomy as scheduled. Patient to call if problems arise before next scheduled appointment. Patient to use the chlorhexidine rinses 3 times daily as instructed.  The patient was dismissed in stable condition.  Dr. Teena Dunk

## 2015-02-12 NOTE — Patient Instructions (Signed)
The patient is to keep lower denture out until lower left mandible heals completely. The patient is to use chlorhexidine rinses 3 times daily as instructed. A new prescription was sent to [facility] per his request. Patient to consider referral to a prosthodontist for evaluation for new upper lower complete dentures. Patient to start using trismus exercises as provided by the physical therapist once again. Patient was encouraged to stop smoking completely. Patient to call if problems arise. Patient return to clinic as scheduled. Dr. Enrique Sack

## 2015-02-25 ENCOUNTER — Other Ambulatory Visit: Payer: Self-pay | Admitting: Hematology and Oncology

## 2015-03-10 ENCOUNTER — Encounter (HOSPITAL_COMMUNITY): Payer: Self-pay | Admitting: Dentistry

## 2015-04-18 ENCOUNTER — Ambulatory Visit (HOSPITAL_BASED_OUTPATIENT_CLINIC_OR_DEPARTMENT_OTHER): Payer: Medicaid Other | Admitting: Hematology and Oncology

## 2015-04-18 ENCOUNTER — Encounter: Payer: Self-pay | Admitting: Hematology and Oncology

## 2015-04-18 ENCOUNTER — Other Ambulatory Visit (HOSPITAL_BASED_OUTPATIENT_CLINIC_OR_DEPARTMENT_OTHER): Payer: Medicaid Other

## 2015-04-18 ENCOUNTER — Telehealth: Payer: Self-pay | Admitting: Hematology and Oncology

## 2015-04-18 VITALS — BP 119/65 | HR 82 | Temp 98.1°F | Resp 17 | Ht 66.0 in | Wt 113.5 lb

## 2015-04-18 DIAGNOSIS — C099 Malignant neoplasm of tonsil, unspecified: Secondary | ICD-10-CM

## 2015-04-18 DIAGNOSIS — D72819 Decreased white blood cell count, unspecified: Secondary | ICD-10-CM | POA: Diagnosis not present

## 2015-04-18 DIAGNOSIS — E039 Hypothyroidism, unspecified: Secondary | ICD-10-CM | POA: Diagnosis not present

## 2015-04-18 DIAGNOSIS — D638 Anemia in other chronic diseases classified elsewhere: Secondary | ICD-10-CM | POA: Diagnosis not present

## 2015-04-18 DIAGNOSIS — K1370 Unspecified lesions of oral mucosa: Secondary | ICD-10-CM

## 2015-04-18 DIAGNOSIS — Z720 Tobacco use: Secondary | ICD-10-CM

## 2015-04-18 DIAGNOSIS — R634 Abnormal weight loss: Secondary | ICD-10-CM

## 2015-04-18 LAB — CBC WITH DIFFERENTIAL/PLATELET
BASO%: 0.5 % (ref 0.0–2.0)
Basophils Absolute: 0 10*3/uL (ref 0.0–0.1)
EOS ABS: 0.1 10*3/uL (ref 0.0–0.5)
EOS%: 2.1 % (ref 0.0–7.0)
HCT: 31.6 % — ABNORMAL LOW (ref 38.4–49.9)
HEMOGLOBIN: 10.6 g/dL — AB (ref 13.0–17.1)
LYMPH%: 22.5 % (ref 14.0–49.0)
MCH: 35.5 pg — ABNORMAL HIGH (ref 27.2–33.4)
MCHC: 33.5 g/dL (ref 32.0–36.0)
MCV: 105.7 fL — ABNORMAL HIGH (ref 79.3–98.0)
MONO#: 0.7 10*3/uL (ref 0.1–0.9)
MONO%: 19.3 % — ABNORMAL HIGH (ref 0.0–14.0)
NEUT#: 2.1 10*3/uL (ref 1.5–6.5)
NEUT%: 55.6 % (ref 39.0–75.0)
Platelets: 161 10*3/uL (ref 140–400)
RBC: 2.99 10*6/uL — ABNORMAL LOW (ref 4.20–5.82)
RDW: 17.1 % — AB (ref 11.0–14.6)
WBC: 3.7 10*3/uL — ABNORMAL LOW (ref 4.0–10.3)
lymph#: 0.8 10*3/uL — ABNORMAL LOW (ref 0.9–3.3)
nRBC: 0 % (ref 0–0)

## 2015-04-18 LAB — COMPREHENSIVE METABOLIC PANEL (CC13)
ALT: 13 U/L (ref 0–55)
AST: 23 U/L (ref 5–34)
Albumin: 4.4 g/dL (ref 3.5–5.0)
Alkaline Phosphatase: 87 U/L (ref 40–150)
Anion Gap: 10 mEq/L (ref 3–11)
BUN: 9.4 mg/dL (ref 7.0–26.0)
CALCIUM: 9.6 mg/dL (ref 8.4–10.4)
CHLORIDE: 104 meq/L (ref 98–109)
CO2: 27 mEq/L (ref 22–29)
Creatinine: 0.9 mg/dL (ref 0.7–1.3)
GLUCOSE: 130 mg/dL (ref 70–140)
POTASSIUM: 3.8 meq/L (ref 3.5–5.1)
Sodium: 140 mEq/L (ref 136–145)
Total Bilirubin: 0.36 mg/dL (ref 0.20–1.20)
Total Protein: 7.8 g/dL (ref 6.4–8.3)

## 2015-04-18 LAB — TSH CHCC: TSH: 2.775 m(IU)/L (ref 0.320–4.118)

## 2015-04-18 NOTE — Telephone Encounter (Signed)
s.w. pt and advised on July 2016 appt....pt ok and aware

## 2015-04-19 DIAGNOSIS — R634 Abnormal weight loss: Secondary | ICD-10-CM | POA: Insufficient documentation

## 2015-04-19 DIAGNOSIS — D638 Anemia in other chronic diseases classified elsewhere: Secondary | ICD-10-CM | POA: Insufficient documentation

## 2015-04-19 DIAGNOSIS — D72819 Decreased white blood cell count, unspecified: Secondary | ICD-10-CM | POA: Insufficient documentation

## 2015-04-19 NOTE — Assessment & Plan Note (Signed)
He will continue thyroid replacement therapy. Recent TSH was normal

## 2015-04-19 NOTE — Assessment & Plan Note (Signed)
Clinically, he has no signs of disease recurrence. He will continue ENT visit. I will see him back in 12 months with history, physical examination and blood work

## 2015-04-19 NOTE — Assessment & Plan Note (Signed)
This is likely constitutional in nature Will observe. He is not symptomatic

## 2015-04-19 NOTE — Progress Notes (Signed)
[facility] OFFICE PROGRESS NOTE  Patient Care Team: Harvie Junior, MD as PCP - General (Specialist) Rozetta Nunnery, MD (Otolaryngology) Kyung Rudd, MD (Radiation Oncology) Lenn Cal, DDS as Consulting Physician (Dentistry) Heath Lark, MD as Consulting Physician (Hematology and Oncology)  SUMMARY OF ONCOLOGIC HISTORY:  This is a pleasant patient was found to have abnormal looking tonsil in 2012 by his dentist. 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 He underwent concurrent chemoradiation therapy with weekly cisplatin from 02/01/2011 through 03/22/2011. Chemotherapy was discontinued early due to 2 prolonged pancytopenia. In 2013, he was noted to have enlarging lymphadenopathy. 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 Last imaging study from 10/29/2013 and January 2016 showed no evidence of disease recurrence  INTERVAL HISTORY: Please see below for problem oriented charting. He still has mild persistent mouth sores from ill fitting dentures He lost some weight because of that. He denies any dysphagia or diagnosis stage. He continued to smoke. He denies recent alcohol intake. He denies new lymphadenopathy   REVIEW OF SYSTEMS:   Constitutional: Denies fevers, chills or abnormal weight loss Eyes: Denies blurriness of vision Ears, nose, mouth, throat, and face: Denies mucositis or sore throat Respiratory: Denies cough, dyspnea or wheezes Cardiovascular: Denies palpitation, chest discomfort or lower extremity swelling Gastrointestinal:  Denies nausea, heartburn or change in bowel habits Skin: Denies abnormal skin rashes Lymphatics: Denies new lymphadenopathy or easy bruising Neurological:Denies numbness, tingling or new  weaknesses Behavioral/Psych: Mood is stable, no new changes  All other systems were reviewed with the patient and are negative.  I have reviewed the past medical history, past surgical history, social history and family history with the patient and they are unchanged from previous note.  ALLERGIES:  has No Known Allergies.  MEDICATIONS:  Current Outpatient Prescriptions  Medication Sig Dispense Refill  . bisacodyl (DULCOLAX) 5 MG EC tablet Take 5 mg by mouth daily as needed for mild constipation or moderate constipation.    . chlorhexidine (PERIDEX) 0.12 % solution Rinse with 15 mls three times daily for 30 seconds each. Use after breakfast, dinner, and at bedtime. Spit out excess. Do not swallow. 1440 mL prn  . Cholecalciferol (VITAMIN D PO) Take 1,000 mg by mouth daily.     Marland Kitchen doxylamine, Sleep, (UNISOM) 25 MG tablet Take 75 mg by mouth at bedtime as needed for sleep (sleep).     . fentaNYL (DURAGESIC - DOSED MCG/HR) 50 MCG/HR Place 1 patch onto the skin every 3 (three) days.    . hydrOXYzine (VISTARIL) 25 MG capsule Take 25 mg by mouth at bedtime.  5  . levothyroxine (SYNTHROID, LEVOTHROID) 50 MCG tablet Take 50 mcg by mouth daily before breakfast.    . levothyroxine (SYNTHROID, LEVOTHROID) 50 MCG tablet TAKE 1 TABLET BY MOUTH EVERY DAY BEFORE BREAKFAST 90 tablet 0  . lipase/protease/amylase (CREON-12/PANCREASE) 12000 UNITS CPEP capsule Take 1 capsule by mouth 4 (four) times daily.    . metoprolol (LOPRESSOR) 50 MG tablet Take 50 mg by mouth 2 (two) times daily.    . nicotine (NICODERM CQ - DOSED IN MG/24 HOURS) 21 mg/24hr patch Place 1 patch (21 mg total) onto the skin daily. 28 patch 1  . Nutritional Supplements (ENSURE PO) Take 1 Can by mouth 3 (three) times daily.    . Nutritional Supplements (FEEDING  SUPPLEMENT, OSMOLITE 1.5 CAL,) LIQD Take 237 mLs by mouth daily. Drinks 4 cans per day    . Omega-3 Fatty Acids (FISH OIL PO) Take 1 capsule by mouth daily.    Marland Kitchen omeprazole (PRILOSEC) 40  MG capsule Take 40 mg by mouth daily.     Marland Kitchen oxyCODONE (ROXICODONE) 15 MG immediate release tablet Take 15 mg by mouth every 4 (four) hours.     . temazepam (RESTORIL) 30 MG capsule Take 60 mg by mouth at bedtime.      No current facility-administered medications for this visit.    PHYSICAL EXAMINATION: ECOG PERFORMANCE STATUS: 1 - Symptomatic but completely ambulatory  Filed Vitals:   04/18/15 1250  BP: 119/65  Pulse: 82  Temp: 98.1 F (36.7 C)  Resp: 17   Filed Weights   04/18/15 1250  Weight: 113 lb 8 oz (51.483 kg)    GENERAL:alert, no distress and comfortable. Thin and older than stated age SKIN: skin color, texture, turgor are normal, no rashes or significant lesions EYES: normal, Conjunctiva are pink and non-injected, sclera clear OROPHARYNX:no exudate, no erythema and lips, buccal mucosa, and tongue normal. No oral lesions NECK: woody neck from prior radiation LYMPH:  no palpable lymphadenopathy in the cervical, axillary or inguinal LUNGS: clear to auscultation and percussion with normal breathing effort HEART: regular rate & rhythm and no murmurs and no lower extremity edema ABDOMEN:abdomen soft, non-tender and normal bowel sounds Musculoskeletal:no cyanosis of digits and no clubbing  NEURO: alert & oriented x 3 with fluent speech, no focal motor/sensory deficits  LABORATORY DATA:  I have reviewed the data as listed    Component Value Date/Time   NA 140 04/18/2015 1230   NA 139 01/22/2015 1826   NA 140 04/24/2012 1200   K 3.8 04/18/2015 1230   K 4.1 01/22/2015 1826   K 4.1 04/24/2012 1200   CL 101 01/22/2015 1826   CL 102 02/27/2013 1045   CL 98 04/24/2012 1200   CO2 27 04/18/2015 1230   CO2 27 01/22/2015 1826   CO2 30 04/24/2012 1200   GLUCOSE 130 04/18/2015 1230   GLUCOSE 115* 01/22/2015 1826   GLUCOSE 118* 02/27/2013 1045   GLUCOSE 102 04/24/2012 1200   BUN 9.4 04/18/2015 1230   BUN 7 01/22/2015 1826   BUN 13 04/24/2012 1200   CREATININE 0.9  04/18/2015 1230   CREATININE 0.71 01/22/2015 1826   CREATININE 0.6 04/24/2012 1200   CALCIUM 9.6 04/18/2015 1230   CALCIUM 9.7 01/22/2015 1826   CALCIUM 9.6 04/24/2012 1200   PROT 7.8 04/18/2015 1230   PROT 6.5 01/18/2015 0419   PROT 8.1 04/24/2012 1200   ALBUMIN 4.4 04/18/2015 1230   ALBUMIN 3.5 01/18/2015 0419   AST 23 04/18/2015 1230   AST 21 01/18/2015 0419   AST 30 04/24/2012 1200   ALT 13 04/18/2015 1230   ALT 15 01/18/2015 0419   ALT 27 04/24/2012 1200   ALKPHOS 87 04/18/2015 1230   ALKPHOS 71 01/18/2015 0419   ALKPHOS 77 04/24/2012 1200   BILITOT 0.36 04/18/2015 1230   BILITOT 0.4 01/18/2015 0419   BILITOT 0.40 04/24/2012 1200   GFRNONAA >90 01/22/2015 1826   GFRAA >90 01/22/2015 1826    No results found for: SPEP, UPEP  Lab Results  Component Value Date   WBC 3.7* 04/18/2015   NEUTROABS 2.1 04/18/2015   HGB 10.6* 04/18/2015   HCT 31.6* 04/18/2015   MCV 105.7* 04/18/2015   PLT 161 04/18/2015  Chemistry      Component Value Date/Time   NA 140 04/18/2015 1230   NA 139 01/22/2015 1826   NA 140 04/24/2012 1200   K 3.8 04/18/2015 1230   K 4.1 01/22/2015 1826   K 4.1 04/24/2012 1200   CL 101 01/22/2015 1826   CL 102 02/27/2013 1045   CL 98 04/24/2012 1200   CO2 27 04/18/2015 1230   CO2 27 01/22/2015 1826   CO2 30 04/24/2012 1200   BUN 9.4 04/18/2015 1230   BUN 7 01/22/2015 1826   BUN 13 04/24/2012 1200   CREATININE 0.9 04/18/2015 1230   CREATININE 0.71 01/22/2015 1826   CREATININE 0.6 04/24/2012 1200      Component Value Date/Time   CALCIUM 9.6 04/18/2015 1230   CALCIUM 9.7 01/22/2015 1826   CALCIUM 9.6 04/24/2012 1200   ALKPHOS 87 04/18/2015 1230   ALKPHOS 71 01/18/2015 0419   ALKPHOS 77 04/24/2012 1200   AST 23 04/18/2015 1230   AST 21 01/18/2015 0419   AST 30 04/24/2012 1200   ALT 13 04/18/2015 1230   ALT 15 01/18/2015 0419   ALT 27 04/24/2012 1200   BILITOT 0.36 04/18/2015 1230   BILITOT 0.4 01/18/2015 0419   BILITOT 0.40  04/24/2012 1200      ASSESSMENT & PLAN:  Malignant neoplasm of tonsil Clinically, he has no signs of disease recurrence. He will continue ENT visit. I will see him back in 12 months with history, physical examination and blood work    Hypothyroidism (acquired) He will continue thyroid replacement therapy. Recent TSH was normal    Chronic leukopenia This is likely constitutional in nature Will observe. He is not symptomatic  Anemia in chronic illness This is likely anemia of chronic disease. The patient denies recent history of bleeding such as epistaxis, hematuria or hematochezia. He is asymptomatic from the anemia. We will observe for now.  He does not require transfusion now.    Nicotine abuse I spent some time counseling the patient the importance of tobacco cessation. He is currently not interested to quit now.   Weight loss, unintentional This is related to ill fitting dentures He declined going back to the dentist due to financial constraints I recommend increase oral intake as tolerated   Orders Placed This Encounter  Procedures  . Comprehensive metabolic panel    Standing Status: Future     Number of Occurrences:      Standing Expiration Date: 05/22/2016  . CBC with Differential/Platelet    Standing Status: Future     Number of Occurrences:      Standing Expiration Date: 05/22/2016  . TSH    Standing Status: Future     Number of Occurrences:      Standing Expiration Date: 05/22/2016   All questions were answered. The patient knows to call the clinic with any problems, questions or concerns. No barriers to learning was detected. I spent 20 minutes counseling the patient face to face. The total time spent in the appointment was 30 minutes and more than 50% was on counseling and review of test results     [facility], [place], [place] 04/19/2015 3:09 PM

## 2015-04-19 NOTE — Assessment & Plan Note (Signed)
This is likely anemia of chronic disease. The patient denies recent history of bleeding such as epistaxis, hematuria or hematochezia. He is asymptomatic from the anemia. We will observe for now.  He does not require transfusion now.   

## 2015-04-19 NOTE — Assessment & Plan Note (Signed)
I spent some time counseling the patient the importance of tobacco cessation. He is currently not interested to quit now.  

## 2015-04-19 NOTE — Assessment & Plan Note (Addendum)
This is related to ill fitting dentures He declined going back to the dentist due to financial constraints I recommend increase oral intake as tolerated

## 2015-05-18 ENCOUNTER — Encounter (HOSPITAL_COMMUNITY): Payer: Self-pay | Admitting: Emergency Medicine

## 2015-05-18 ENCOUNTER — Emergency Department (HOSPITAL_COMMUNITY): Payer: Medicaid Other

## 2015-05-18 ENCOUNTER — Emergency Department (HOSPITAL_COMMUNITY)
Admission: EM | Admit: 2015-05-18 | Discharge: 2015-05-18 | Disposition: A | Discharge status: Home / Self Care | Payer: Medicaid Other | Attending: Emergency Medicine | Admitting: Emergency Medicine

## 2015-05-18 DIAGNOSIS — Z8589 Personal history of malignant neoplasm of other organs and systems: Secondary | ICD-10-CM | POA: Diagnosis not present

## 2015-05-18 DIAGNOSIS — I10 Essential (primary) hypertension: Secondary | ICD-10-CM | POA: Diagnosis not present

## 2015-05-18 DIAGNOSIS — Z720 Tobacco use: Secondary | ICD-10-CM | POA: Diagnosis not present

## 2015-05-18 DIAGNOSIS — Z862 Personal history of diseases of the blood and blood-forming organs and certain disorders involving the immune mechanism: Secondary | ICD-10-CM | POA: Insufficient documentation

## 2015-05-18 DIAGNOSIS — R0602 Shortness of breath: Secondary | ICD-10-CM | POA: Diagnosis not present

## 2015-05-18 DIAGNOSIS — Z79899 Other long term (current) drug therapy: Secondary | ICD-10-CM | POA: Diagnosis not present

## 2015-05-18 DIAGNOSIS — Z8719 Personal history of other diseases of the digestive system: Secondary | ICD-10-CM | POA: Diagnosis not present

## 2015-05-18 DIAGNOSIS — E039 Hypothyroidism, unspecified: Secondary | ICD-10-CM | POA: Insufficient documentation

## 2015-05-18 DIAGNOSIS — Z8614 Personal history of Methicillin resistant Staphylococcus aureus infection: Secondary | ICD-10-CM | POA: Diagnosis not present

## 2015-05-18 DIAGNOSIS — Z8619 Personal history of other infectious and parasitic diseases: Secondary | ICD-10-CM | POA: Diagnosis not present

## 2015-05-18 LAB — BASIC METABOLIC PANEL
Anion gap: 7 (ref 5–15)
BUN: 10 mg/dL (ref 6–20)
CALCIUM: 9.2 mg/dL (ref 8.9–10.3)
CHLORIDE: 101 mmol/L (ref 101–111)
CO2: 28 mmol/L (ref 22–32)
CREATININE: 0.68 mg/dL (ref 0.61–1.24)
GFR calc Af Amer: >60 mL/min (ref >60)
GFR calc non Af Amer: >60 mL/min (ref >60)
GLUCOSE: 110 mg/dL — AB (ref 65–99)
POTASSIUM: 4.3 mmol/L (ref 3.5–5.1)
SODIUM: 136 mmol/L (ref 135–145)

## 2015-05-18 LAB — CBC
HEMATOCRIT: 28 % — AB (ref 39.0–52.0)
Hemoglobin: 9.5 g/dL — ABNORMAL LOW (ref 13.0–17.0)
MCH: 37.5 pg — ABNORMAL HIGH (ref 26.0–34.0)
MCHC: 33.9 g/dL (ref 30.0–36.0)
MCV: 110.7 fL — ABNORMAL HIGH (ref 78.0–100.0)
Platelets: 146 10*3/uL — ABNORMAL LOW (ref 150–400)
RBC: 2.53 MIL/uL — AB (ref 4.22–5.81)
RDW: 17.4 % — AB (ref 11.5–15.5)
WBC: 2.9 10*3/uL — ABNORMAL LOW (ref 4.0–10.5)

## 2015-05-18 LAB — BRAIN NATRIURETIC PEPTIDE: B NATRIURETIC PEPTIDE 5: 125.2 pg/mL — AB (ref 0.0–100.0)

## 2015-05-18 LAB — TROPONIN I: Troponin I: <0.03 ng/mL (ref <0.031)

## 2015-05-18 MED ORDER — AZITHROMYCIN 250 MG PO TABS
500.0000 mg | ORAL_TABLET | Freq: Once | ORAL | Status: AC
  Administered 2015-05-18: 500 mg via ORAL
  Filled 2015-05-18: qty 2

## 2015-05-18 MED ORDER — PREDNISONE 20 MG PO TABS: 40.0000 mg | ORAL_TABLET | Freq: Every day | ORAL | Status: DC

## 2015-05-18 MED ORDER — AZITHROMYCIN 250 MG PO TABS: 250.0000 mg | ORAL_TABLET | Freq: Every day | ORAL | Status: DC

## 2015-05-18 MED ORDER — DEXAMETHASONE SODIUM PHOSPHATE 10 MG/ML IJ SOLN: 10.0000 mg | Freq: Once | INTRAMUSCULAR | Status: DC

## 2015-05-18 MED ORDER — ALBUTEROL SULFATE (2.5 MG/3ML) 0.083% IN NEBU
5.0000 mg | INHALATION_SOLUTION | Freq: Once | RESPIRATORY_TRACT | Status: AC
  Administered 2015-05-18: 5 mg via RESPIRATORY_TRACT
  Filled 2015-05-18: qty 6

## 2015-05-18 MED ORDER — DEXAMETHASONE 4 MG PO TABS: 10.0000 mg | ORAL_TABLET | Freq: Once | ORAL | Status: DC

## 2015-05-18 MED ORDER — ALBUTEROL SULFATE HFA 108 (90 BASE) MCG/ACT IN AERS
2.0000 | INHALATION_SPRAY | RESPIRATORY_TRACT | Status: DC | PRN
Start: 2015-05-18 — End: 2015-05-18
  Administered 2015-05-18: 2 via RESPIRATORY_TRACT
  Filled 2015-05-18: qty 6.7

## 2015-05-18 MED ORDER — PREDNISONE 20 MG PO TABS
40.0000 mg | ORAL_TABLET | Freq: Once | ORAL | Status: AC
  Administered 2015-05-18: 40 mg via ORAL
  Filled 2015-05-18: qty 2

## 2015-05-18 NOTE — ED Notes (Signed)
IV attempt x3. IV team unable to access.

## 2015-05-18 NOTE — ED Notes (Signed)
IV team at bedside 

## 2015-05-18 NOTE — ED Notes (Addendum)
Pt complaining of new onset cough increasing over the past two days. Hx of multiple recurring cancers. Presents with congested moist productive cough and SOB. States he feels exhausted doing little tasks around the house. 86% on room air, diminished lung sounds with expiratory wheezing/rhonchi throughout lobes. EKG NSR in triage, states he does feel nauseous, no vomiting. States he took his Oxycodone around 8 am and his phenergan around 10 am today

## 2015-05-18 NOTE — ED Notes (Signed)
Inhaler/spacer teaching done by RT and RN.  Pt verbalized understanding

## 2015-05-18 NOTE — ED Notes (Signed)
Respiratory coming to do patient teaching

## 2015-05-18 NOTE — Discharge Instructions (Signed)
**  PLEASE START ALL OF YOUR MEDICATIONS TOMORROW MORNING AND FINISH ALL MEDICATIONS AS PRESCRIBED*

## 2015-05-18 NOTE — ED Notes (Signed)
Pt ambulated approximately 50 feet.  Beginning O2 saturation was 95% and dropped to 90-91% during ambulation.  Patient had no complaints

## 2015-05-18 NOTE — ED Notes (Signed)
Lab called for blood draw.

## 2015-05-19 NOTE — ED Provider Notes (Signed)
CSN: [id number]     Arrival date & time 05/18/15  1105 History   First MD Initiated Contact with Patient 05/18/15 1213     No chief complaint on file.    (Consider location/radiation/quality/duration/timing/severity/associated sxs/prior Treatment) HPI Comments: 59 year old male with extensive past medical history including hypertension, chronic pancreatitis, hypothyroidism, tonsillar cancer, significant tobacco use who presents with shortness of breath. Patient states that 2 days ago he began having a cough that is now productive of yellow phlegm. He had a gradual onset of shortness of breath that has been worsening. He has some chest pain when he coughs but no chest pain at rest. Patient endorses feeling fatigued. He denies any fevers, vomiting, diarrhea, abdominal pain, or urinary symptoms. He denies any history of blood clots.  The history is provided by the patient.    Past Medical History  Diagnosis Date  . Hypertension   . Chronic pancreatitis   . Arthritis   . Hx of radiation therapy 02/02/11 to 03/22/11    L tonsil  . Seizures     alcohol-related  . Cervical adenopathy 04/26/2012  . Urinary hesitancy   . Constipation   . History of blood transfusion ~ 2004    "from the pancreatitis"  . Daily headache 05/23/2012    "small ones"  . History of radiation therapy 02/02/2011-03/22/2011    head/neck,left tonsil  . G tube feedings     has a feeding tube in stomach  . Hypothyroidism 10/29/2013  . Thrush 11/02/2013  . 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 Pancytopenia 02/28/2014   Past Surgical History  Procedure Laterality Date  . Bile duct stent placement      hx of  . Radical neck dissection  05/23/2012    w/mass excision  . Cholecystectomy  04/2005  . Tibia fracture surgery  1990's    left  . Fracture surgery    . Direct laryngoscopy  05/23/2012    Procedure: DIRECT LARYNGOSCOPY;  Surgeon: Rozetta Nunnery, MD;   Location: [place];  Service: ENT;  Laterality: N/A;  . Mass biopsy  05/23/2012    Procedure: NECK MASS BIOPSY;  Surgeon: Rozetta Nunnery, MD;  Location: [place];  Service: ENT;  Laterality: N/A;  . Radical neck dissection  05/23/2012    Procedure: RADICAL NECK DISSECTION;  Surgeon: Rozetta Nunnery, MD;  Location: [place];  Service: ENT;  Laterality: Left;  . Direct laryngoscopy N/A 03/23/2013    Procedure: DIRECT LARYNGOSCOPY;  Surgeon: Rozetta Nunnery, MD;  Location: [place];  Service: ENT;  Laterality: N/A;  . Esophageal dilation N/A 03/23/2013    Procedure: ESOPHAGEAL DILATION;  Surgeon: Rozetta Nunnery, MD;  Location: [place];  Service: ENT;  Laterality: N/A;   Family History  Problem Relation Age of Onset  . COPD Mother    Social History  Substance Use Topics  . Smoking status: Current Every Day Smoker -- 0.25 packs/day for 40 years    Types: Cigarettes  . Smokeless tobacco: Never Used     Comment: hx 1/2 -1 PPD  . Alcohol Use: No     Comment: 05/23/2012 hx alcohol abuse, quit ~ 2000    Review of Systems  10 Systems reviewed and are negative for acute change except as noted in the HPI.   Allergies  Review of patient's allergies indicates no known allergies.  Home Medications   Prior to  Admission medications   Medication Sig Start Date End Date Taking? Authorizing Provider  bisacodyl (DULCOLAX) 5 MG EC tablet Take 5 mg by mouth daily as needed for mild constipation or moderate constipation.   Yes Historical Provider, MD  chlorhexidine (PERIDEX) 0.12 % solution Rinse with 15 mls three times daily for 30 seconds each. Use after breakfast, dinner, and at bedtime. Spit out excess. Do not swallow. 02/12/15  Yes Lenn Cal, DDS  Cholecalciferol (VITAMIN D PO) Take 1,000 mg by mouth daily.    Yes Historical Provider, MD  doxylamine, Sleep, (UNISOM) 25 MG tablet Take 75 mg by mouth at bedtime as needed for sleep (sleep).    Yes  Historical Provider, MD  fentaNYL (DURAGESIC - DOSED MCG/HR) 50 MCG/HR Place 1 patch onto the skin every 3 (three) days.   Yes Historical Provider, MD  folic acid (FOLVITE) 1 MG tablet Take 1 mg by mouth daily. 05/14/15  Yes Historical Provider, MD  hydrOXYzine (VISTARIL) 25 MG capsule Take 25 mg by mouth at bedtime. 08/15/14  Yes Historical Provider, MD  levothyroxine (SYNTHROID, LEVOTHROID) 75 MCG tablet Take 75 mcg by mouth daily. 05/14/15  Yes Historical Provider, MD  lipase/protease/amylase (CREON-12/PANCREASE) 12000 UNITS CPEP capsule Take 1 capsule by mouth 4 (four) times daily.   Yes Historical Provider, MD  metoprolol tartrate (LOPRESSOR) 25 MG tablet Take 25 mg by mouth 2 (two) times daily. 04/28/15  Yes Historical Provider, MD  nicotine (NICODERM CQ - DOSED IN MG/24 HOURS) 21 mg/24hr patch Place 1 patch (21 mg total) onto the skin daily. 01/19/15  Yes Albertine Patricia, MD  Nutritional Supplements (ENSURE PO) Take 1 Can by mouth 3 (three) times daily.   Yes Historical Provider, MD  Nutritional Supplements (FEEDING SUPPLEMENT, OSMOLITE 1.5 CAL,) LIQD Take 237 mLs by mouth daily. Drinks 4 cans per day   Yes Historical Provider, MD  Omega-3 Fatty Acids (FISH OIL PO) Take 1 capsule by mouth daily.   Yes Historical Provider, MD  omeprazole (PRILOSEC) 40 MG capsule Take 40 mg by mouth daily.    Yes Historical Provider, MD  oxyCODONE (ROXICODONE) 15 MG immediate release tablet Take 15 mg by mouth every 4 (four) hours.    Yes Historical Provider, MD  promethazine (PHENERGAN) 25 MG tablet Take 25 mg by mouth every 4 (four) hours as needed for nausea.  05/05/15  Yes Historical Provider, MD  temazepam (RESTORIL) 30 MG capsule Take 60 mg by mouth at bedtime.    Yes Historical Provider, MD  azithromycin (ZITHROMAX Z-PAK) 250 MG tablet Take 1 tablet (250 mg total) by mouth daily. 05/18/15   Sharlett Iles, MD  mirtazapine (REMERON) 30 MG tablet Take 1 tablet by mouth at bedtime. 04/21/15   Historical  Provider, MD  predniSONE (DELTASONE) 20 MG tablet Take 2 tablets (40 mg total) by mouth daily. 05/18/15   Wenda Overland Little, MD   BP 128/72 mmHg  Pulse 118  Temp(Src) 98.5 F (36.9 C) (Oral)  Resp 18  SpO2 94% Physical Exam  Constitutional: He is oriented to person, place, and time.  Thin male appears older than stated age, mildly increased work of breathing but no distress  HENT:  Head: Normocephalic and atraumatic.  Moist mucous membranes  Eyes: Conjunctivae are normal. Pupils are equal, round, and reactive to light.  Neck: Neck supple.  Cardiovascular: Normal rate, regular rhythm and normal heart sounds.   No murmur heard. Pulmonary/Chest:  Increased work of breathing with inspiratory and expiratory wheezes bilaterally and  diminished breath sounds bilateral bases  Abdominal: Soft. Bowel sounds are normal. He exhibits no distension. There is no tenderness.  Musculoskeletal: He exhibits no edema.  Neurological: He is alert and oriented to person, place, and time.  Fluent speech  Skin: Skin is warm and dry.  Psychiatric: He has a normal mood and affect. Judgment normal.  Nursing note and vitals reviewed.   ED Course  Procedures (including critical care time) Labs Review Labs Reviewed  BASIC METABOLIC PANEL - Abnormal; Notable for the following:    Glucose, Bld 110 (*)    All other components within normal limits  CBC - Abnormal; Notable for the following:    WBC 2.9 (*)    RBC 2.53 (*)    Hemoglobin 9.5 (*)    HCT 28.0 (*)    MCV 110.7 (*)    MCH 37.5 (*)    RDW 17.4 (*)    Platelets 146 (*)    All other components within normal limits  BRAIN NATRIURETIC PEPTIDE - Abnormal; Notable for the following:    B Natriuretic Peptide 125.2 (*)    All other components within normal limits  TROPONIN I    Imaging Review Dg Chest 2 View  05/18/2015   CLINICAL DATA:  59 year old male with shortness of breath and productive cough increasing for 2 days. Initial encounter.  Smoker.  EXAM: CHEST  2 VIEW  COMPARISON:  12/27/2014 and earlier.  FINDINGS: Chronic large lung volumes. Normal cardiac size and mediastinal contours. Visualized tracheal air column is within normal limits. No pneumothorax or pulmonary edema. No pleural effusion or consolidation. No acute pulmonary opacity. Right 6 chronic rib fracture. Stable cholecystectomy clips. No acute osseous abnormality identified.  IMPRESSION: Chronic pulmonary hyperinflation. No acute cardiopulmonary abnormality.   Electronically Signed   By: Genevie Ann M.D.   On: 05/18/2015 12:58   I, Ehrhardt, personally reviewed and evaluated these images and lab results as part of my medical decision-making.   EKG Interpretation   Date/Time:  Sunday May 18 2015 11:48:23 EDT Ventricular Rate:  74 PR Interval:  163 QRS Duration: 64 QT Interval:  375 QTC Calculation: 416 R Axis:   86 Text Interpretation:  Sinus rhythm Anteroseptal infarct, age indeterminate  Lead(s) I were not used for morphology analysis No significant change  since last tracing Confirmed by LITTLE MD, RACHEL [id number]) on 05/18/2015  12:43:40 PM     Medications  albuterol (PROVENTIL) (2.5 MG/3ML) 0.083% nebulizer solution 5 mg (5 mg Nebulization Given 05/18/15 1239)  predniSONE (DELTASONE) tablet 40 mg (40 mg Oral Given 05/18/15 1637)  azithromycin (ZITHROMAX) tablet 500 mg (500 mg Oral Given 05/18/15 1637)    MDM   Final diagnoses:  Shortness of breath    59 year old male presents with 2 days of cough as well as gradually worsening shortness of breath. On arrival, the patient was noted to be 86% with difficulty obtaining accurate O2 sat at triage; patient with increased work of breathing but no acute distress. Placed on DuoNeb and supplemental oxygen. He had wheezing throughout all lung fields. Obtained lab work listed above as well as chest x-ray and EKG. EKG showed no ischemic changes and chest x-ray with chronic pulmonary hyperinflation  suggestive of COPD but no acute infiltrate. Labs notable for mild anemia and WBC 2.9 but normal BMP, troponin, BNP. Gave the patient a dose of prednisone. On reexamination, the patient stated that his breathing was much improved. O2 sat was mid 90s on room air with improvement  in his work of breathing. The patient denies a diagnosis of COPD but has a significant smoking history and given his findings on chest x-ray and his exam and presentation, I suspect that he is having a COPD exacerbation. He has improved significantly after receiving treatments and remained stable on room air. Therefore I feel that he is safe for discharge home with outpatient treatment. Gave the patient a dose of azithromycin as well as prescriptions for albuterol, prednisone, and azithromycin at home. Respiratory instructed the patient on appropriate use of inhaler with spacer. Patient ambulated without respiratory distress and O2 sats remained 90% or greater. The patient denies any sudden onset of shortness of breath or any association with chest pain. Given that he is improved with COPD treatment, I feel that PE or ACS or unlikely explanation for his symptoms. I have extensively reviewed return precautions with the patient and he has voiced understanding. Instructed to follow-up with PCP. Patient discharged in satisfactory condition.  Sharlett Iles, MD 05/19/15 [phone]

## 2016-02-17 ENCOUNTER — Emergency Department (HOSPITAL_COMMUNITY): Payer: Medicaid Other

## 2016-02-17 ENCOUNTER — Encounter (HOSPITAL_COMMUNITY): Payer: Self-pay | Admitting: Emergency Medicine

## 2016-02-17 ENCOUNTER — Emergency Department (HOSPITAL_COMMUNITY)
Admission: EM | Admit: 2016-02-17 | Discharge: 2016-02-17 | Disposition: A | Discharge status: Home / Self Care | Payer: Medicaid Other | Attending: Emergency Medicine | Admitting: Emergency Medicine

## 2016-02-17 DIAGNOSIS — Z792 Long term (current) use of antibiotics: Secondary | ICD-10-CM | POA: Insufficient documentation

## 2016-02-17 DIAGNOSIS — Z79899 Other long term (current) drug therapy: Secondary | ICD-10-CM | POA: Diagnosis not present

## 2016-02-17 DIAGNOSIS — Z8589 Personal history of malignant neoplasm of other organs and systems: Secondary | ICD-10-CM | POA: Diagnosis not present

## 2016-02-17 DIAGNOSIS — E039 Hypothyroidism, unspecified: Secondary | ICD-10-CM | POA: Diagnosis not present

## 2016-02-17 DIAGNOSIS — I10 Essential (primary) hypertension: Secondary | ICD-10-CM | POA: Diagnosis not present

## 2016-02-17 DIAGNOSIS — M1990 Unspecified osteoarthritis, unspecified site: Secondary | ICD-10-CM | POA: Insufficient documentation

## 2016-02-17 DIAGNOSIS — F17210 Nicotine dependence, cigarettes, uncomplicated: Secondary | ICD-10-CM | POA: Diagnosis not present

## 2016-02-17 DIAGNOSIS — R042 Hemoptysis: Secondary | ICD-10-CM | POA: Insufficient documentation

## 2016-02-17 DIAGNOSIS — Z79891 Long term (current) use of opiate analgesic: Secondary | ICD-10-CM | POA: Diagnosis not present

## 2016-02-17 DIAGNOSIS — Z7952 Long term (current) use of systemic steroids: Secondary | ICD-10-CM | POA: Insufficient documentation

## 2016-02-17 LAB — COMPREHENSIVE METABOLIC PANEL
ALT: 31 U/L (ref 17–63)
AST: 41 U/L (ref 15–41)
Albumin: 4.4 g/dL (ref 3.5–5.0)
Alkaline Phosphatase: 64 U/L (ref 38–126)
Anion gap: 8 (ref 5–15)
BUN: 19 mg/dL (ref 6–20)
CO2: 30 mmol/L (ref 22–32)
Calcium: 9.2 mg/dL (ref 8.9–10.3)
Chloride: 95 mmol/L — ABNORMAL LOW (ref 101–111)
Creatinine, Ser: 0.74 mg/dL (ref 0.61–1.24)
GFR calc Af Amer: >60 mL/min (ref >60)
GFR calc non Af Amer: >60 mL/min (ref >60)
Glucose, Bld: 105 mg/dL — ABNORMAL HIGH (ref 65–99)
Potassium: 3.6 mmol/L (ref 3.5–5.1)
Sodium: 133 mmol/L — ABNORMAL LOW (ref 135–145)
Total Bilirubin: 0.5 mg/dL (ref 0.3–1.2)
Total Protein: 6.9 g/dL (ref 6.5–8.1)

## 2016-02-17 LAB — URINALYSIS, ROUTINE W REFLEX MICROSCOPIC
Bilirubin Urine: NEGATIVE
Glucose, UA: NEGATIVE mg/dL
Hgb urine dipstick: NEGATIVE
Ketones, ur: NEGATIVE mg/dL
Leukocytes, UA: NEGATIVE
Nitrite: NEGATIVE
Protein, ur: NEGATIVE mg/dL
Specific Gravity, Urine: 1.016 (ref 1.005–1.030)
pH: 6 (ref 5.0–8.0)

## 2016-02-17 LAB — CBC
HCT: 22.8 % — ABNORMAL LOW (ref 39.0–52.0)
Hemoglobin: 8.1 g/dL — ABNORMAL LOW (ref 13.0–17.0)
MCH: 38.9 pg — ABNORMAL HIGH (ref 26.0–34.0)
MCHC: 35.5 g/dL (ref 30.0–36.0)
MCV: 109.6 fL — ABNORMAL HIGH (ref 78.0–100.0)
Platelets: 154 10*3/uL (ref 150–400)
RBC: 2.08 MIL/uL — ABNORMAL LOW (ref 4.22–5.81)
RDW: 18.8 % — ABNORMAL HIGH (ref 11.5–15.5)
WBC: 7.9 10*3/uL (ref 4.0–10.5)

## 2016-02-17 LAB — LIPASE, BLOOD: Lipase: 11 U/L (ref 11–51)

## 2016-02-17 MED ORDER — LEVOFLOXACIN 500 MG PO TABS: 500.0000 mg | ORAL_TABLET | Freq: Every day | ORAL | Status: DC

## 2016-02-17 MED ORDER — LEVOFLOXACIN 500 MG PO TABS
500.0000 mg | ORAL_TABLET | Freq: Once | ORAL | Status: AC
  Administered 2016-02-17: 500 mg via ORAL
  Filled 2016-02-17: qty 1

## 2016-02-17 MED ORDER — TEMAZEPAM 30 MG PO CAPS: 30.0000 mg | ORAL_CAPSULE | Freq: Every evening | ORAL | Status: DC | PRN

## 2016-02-17 NOTE — ED Provider Notes (Signed)
CSN: [id number]     Arrival date & time 02/17/16  1949 History   First MD Initiated Contact with Patient 02/17/16 2100     Chief Complaint  Patient presents with  . Abdominal Pain  . Hemoptysis     (Consider location/radiation/quality/duration/timing/severity/associated sxs/prior Treatment) HPI   60 year old male with hemoptysis. For the last several days his been coughing up sputum which has been anywhere from light pink to looking like frank blood. No fevers or chills. No acute pain. No unusual leg pain or swelling. Has a long history of smoking. Also history of head/neck cancer. Not currently undergoing treatment. Denies past history of DVT. No dizziness or lightheadedness. Has slowly been losing weight. No night sweats.  Past Medical History  Diagnosis Date  . Hypertension   . Chronic pancreatitis ([place])   . Arthritis   . Hx of radiation therapy 02/02/11 to 03/22/11    L tonsil  . Seizures ([place])     alcohol-related  . Cervical adenopathy 04/26/2012  . Urinary hesitancy   . Constipation   . History of blood transfusion ~ 2004    "from the pancreatitis"  . Daily headache 05/23/2012    "small ones"  . History of radiation therapy 02/02/2011-03/22/2011    head/neck,left tonsil  . G tube feedings ([place])     has a feeding tube in stomach  . Hypothyroidism 10/29/2013  . Thrush 11/02/2013  . Tonsillar cancer ([place]) 12/15/2010    Left  . Neck malignant neoplasm ([place]) 05/23/2012  . 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 Pancytopenia ([place]) 02/28/2014   Past Surgical History  Procedure Laterality Date  . Bile duct stent placement      hx of  . Radical neck dissection  05/23/2012    w/mass excision  . Cholecystectomy  04/2005  . Tibia fracture surgery  1990's    left  . Fracture surgery    . Direct laryngoscopy  05/23/2012    Procedure: DIRECT LARYNGOSCOPY;  Surgeon: Rozetta Nunnery, MD;  Location: [place];  Service: ENT;  Laterality: N/A;  . Mass biopsy  05/23/2012    Procedure: NECK MASS BIOPSY;   Surgeon: Rozetta Nunnery, MD;  Location: [place];  Service: ENT;  Laterality: N/A;  . Radical neck dissection  05/23/2012    Procedure: RADICAL NECK DISSECTION;  Surgeon: Rozetta Nunnery, MD;  Location: [place];  Service: ENT;  Laterality: Left;  . Direct laryngoscopy N/A 03/23/2013    Procedure: DIRECT LARYNGOSCOPY;  Surgeon: Rozetta Nunnery, MD;  Location: [place];  Service: ENT;  Laterality: N/A;  . Esophageal dilation N/A 03/23/2013    Procedure: ESOPHAGEAL DILATION;  Surgeon: Rozetta Nunnery, MD;  Location: [place];  Service: ENT;  Laterality: N/A;   Family History  Problem Relation Age of Onset  . COPD Mother    Social History  Substance Use Topics  . Smoking status: Current Every Day Smoker -- 0.25 packs/day for 40 years    Types: Cigarettes  . Smokeless tobacco: Never Used     Comment: hx 1/2 -1 PPD  . Alcohol Use: No     Comment: 05/23/2012 hx alcohol abuse, quit ~ 2000    Review of Systems  All systems reviewed and negative, other than as noted in HPI.   Allergies  Review of patient's allergies indicates no known allergies.  Home Medications   Prior to Admission medications   Medication Sig Start Date End Date Taking? Authorizing Provider  Cholecalciferol (VITAMIN D  PO) Take 1,000 mg by mouth daily.    Yes Historical Provider, MD  folic acid (FOLVITE) 1 MG tablet Take 1 mg by mouth daily. 05/14/15  Yes Historical Provider, MD  levothyroxine (SYNTHROID, LEVOTHROID) 75 MCG tablet Take 75 mcg by mouth daily. 05/14/15  Yes Historical Provider, MD  lipase/protease/amylase (CREON-12/PANCREASE) 12000 UNITS CPEP capsule Take 1 capsule by mouth 4 (four) times daily.   Yes Historical Provider, MD  metoprolol (LOPRESSOR) 50 MG tablet TK 1 T PO BID 02/02/16  Yes Historical Provider, MD  Omega-3 Fatty Acids (FISH OIL PO) Take 1 capsule by mouth daily.   Yes Historical Provider, MD  omeprazole (PRILOSEC) 40 MG capsule Take 40 mg by mouth  daily.    Yes Historical Provider, MD  ondansetron (ZOFRAN) 4 MG tablet TK 1 T PO BID N 02/11/16  Yes Historical Provider, MD  oxyCODONE (ROXICODONE) 15 MG immediate release tablet Take 30 mg by mouth every 6 (six) hours as needed for pain.    Yes Historical Provider, MD  azithromycin (ZITHROMAX Z-PAK) 250 MG tablet Take 1 tablet (250 mg total) by mouth daily. Patient not taking: Reported on 02/17/2016 05/18/15   Sharlett Iles, MD  bisacodyl (DULCOLAX) 5 MG EC tablet Take 5 mg by mouth daily as needed for mild constipation or moderate constipation.    Historical Provider, MD  chlorhexidine (PERIDEX) 0.12 % solution Rinse with 15 mls three times daily for 30 seconds each. Use after breakfast, dinner, and at bedtime. Spit out excess. Do not swallow. Patient not taking: Reported on 02/17/2016 02/12/15   Lenn Cal, DDS  doxylamine, Sleep, (UNISOM) 25 MG tablet Take 75 mg by mouth at bedtime as needed for sleep (sleep).     Historical Provider, MD  mirtazapine (REMERON) 30 MG tablet TK 1 T PO QHS 02/02/16   Historical Provider, MD  nicotine (NICODERM CQ - DOSED IN MG/24 HOURS) 21 mg/24hr patch Place 1 patch (21 mg total) onto the skin daily. Patient not taking: Reported on 02/17/2016 01/19/15   Silver Huguenin Elgergawy, MD  predniSONE (DELTASONE) 20 MG tablet Take 2 tablets (40 mg total) by mouth daily. Patient not taking: Reported on 02/17/2016 05/18/15   Sharlett Iles, MD  PROAIR HFA 108 [phone] Base) MCG/ACT inhaler INL 2 PUFFS PO QID PRN FOR SOB 02/02/16   Historical Provider, MD  temazepam (RESTORIL) 30 MG capsule TK ONE C PO QHS 01/28/16   Historical Provider, MD   BP 105/62 mmHg  Pulse 70  Temp(Src) 98.7 F (37.1 C) (Oral)  Resp 18  SpO2 95% Physical Exam  Constitutional: He appears well-developed and well-nourished. No distress.  Sitting in bed. Slight build. No acute distress.  HENT:  Head: Normocephalic and atraumatic.  Eyes: Conjunctivae are normal. Right eye exhibits no discharge.  Left eye exhibits no discharge.  Neck: Neck supple.  Cardiovascular: Normal rate, regular rhythm and normal heart sounds.  Exam reveals no gallop and no friction rub.   No murmur heard. Pulmonary/Chest: Effort normal. No respiratory distress. He has wheezes.  Blood-tinged sputum. Faint wheezing bilaterally. No respiratory distress.  Abdominal: Soft. He exhibits no distension. There is no tenderness.  Musculoskeletal: He exhibits no edema or tenderness.  Neurological: He is alert.  Skin: Skin is warm and dry.  Psychiatric: He has a normal mood and affect. His behavior is normal. Thought content normal.  Nursing note and vitals reviewed.   ED Course  Procedures (including critical care time) Labs Review Labs Reviewed  COMPREHENSIVE METABOLIC  PANEL - Abnormal; Notable for the following:    Sodium 133 (*)    Chloride 95 (*)    Glucose, Bld 105 (*)    All other components within normal limits  CBC - Abnormal; Notable for the following:    RBC 2.08 (*)    Hemoglobin 8.1 (*)    HCT 22.8 (*)    MCV 109.6 (*)    MCH 38.9 (*)    RDW 18.8 (*)    All other components within normal limits  LIPASE, BLOOD  URINALYSIS, ROUTINE W REFLEX MICROSCOPIC (NOT AT [facility])    Imaging Review No results found.   Dg Chest 2 View  02/17/2016  CLINICAL DATA:  Hemoptysis for 3 days worsened this morning, recent weight loss, long-time smoker, hypertension, head neck cancer of the tonsils post radiation therapy EXAM: CHEST  2 VIEW COMPARISON:  05/18/2015 FINDINGS: Normal heart size, mediastinal contours, and pulmonary vascularity. Emphysematous and minimal bronchitic changes consistent with COPD. No infiltrate identified at medial RIGHT lung base, could represent pneumonia or pulmonary hemorrhage in a patient with hemoptysis. Chronic linear opacity at the retrocardiac LEFT lower lobe favor scarring. Remaining lungs clear. No pleural effusion or pneumothorax. Old BILATERAL rib fractures. IMPRESSION: Medial RIGHT  basilar infiltrate question pulmonary hemorrhage versus pneumonia. COPD changes with probable LEFT basilar scarring. Electronically Signed   By: Lavonia Dana M.D.   On: 02/17/2016 23:07   I have personally reviewed and evaluated these images and lab results as part of my medical decision-making.   EKG Interpretation None      MDM   Final diagnoses:  Hemoptysis    60 year old male with hemoptysis. Sounds relatively minimal. Blood mixed in with sputum noted in the emergency room. His long smoking history, weight loss and hemoptysis are concerning for possible malignancy. Does have a past history of head/neck cancer. He is in no distress though. Denies shortness of breath. Anemia noted. Chronic to some degree but looks like it has been progressing. His symptoms vital signs suggest doses more indolent process. He is not on any blood thinners. Chest x-ray as above. Hopefully this is an infectious process but he doesn't really have other symptoms which would necessarily correlate with this. Doubt PE. We'll give him course of antibiotics. He needs repeat evaluation but I feel is appropriate for discharge at this time. Return precautions were discussed.    Virgel Manifold, MD 02/25/16 2342

## 2016-02-17 NOTE — ED Notes (Signed)
Pt states that he has had abdominal pain secondary to pancreatitis today and felt an 'electric shock' go through his body today. Also states he has blood in his sputum. Alert and oriented.

## 2016-04-15 ENCOUNTER — Other Ambulatory Visit: Payer: Self-pay | Admitting: Hematology and Oncology

## 2016-04-15 ENCOUNTER — Encounter: Payer: Self-pay | Admitting: Hematology and Oncology

## 2016-04-15 DIAGNOSIS — C099 Malignant neoplasm of tonsil, unspecified: Secondary | ICD-10-CM

## 2016-04-15 DIAGNOSIS — D539 Nutritional anemia, unspecified: Secondary | ICD-10-CM

## 2016-04-15 HISTORY — DX: Nutritional anemia, unspecified: D53.9

## 2016-04-16 ENCOUNTER — Ambulatory Visit (HOSPITAL_BASED_OUTPATIENT_CLINIC_OR_DEPARTMENT_OTHER): Payer: Medicaid Other

## 2016-04-16 ENCOUNTER — Ambulatory Visit (HOSPITAL_BASED_OUTPATIENT_CLINIC_OR_DEPARTMENT_OTHER): Payer: Medicaid Other | Admitting: Hematology and Oncology

## 2016-04-16 ENCOUNTER — Ambulatory Visit: Payer: Medicaid Other

## 2016-04-16 ENCOUNTER — Telehealth: Payer: Self-pay | Admitting: Hematology and Oncology

## 2016-04-16 ENCOUNTER — Other Ambulatory Visit (HOSPITAL_BASED_OUTPATIENT_CLINIC_OR_DEPARTMENT_OTHER): Payer: Medicaid Other

## 2016-04-16 ENCOUNTER — Other Ambulatory Visit: Payer: Self-pay | Admitting: Hematology and Oncology

## 2016-04-16 ENCOUNTER — Ambulatory Visit (HOSPITAL_COMMUNITY)
Admission: RE | Admit: 2016-04-16 | Discharge: 2016-04-16 | Disposition: A | Discharge status: Home / Self Care | Payer: Medicaid Other | Source: Ambulatory Visit | Attending: Hematology and Oncology | Admitting: Hematology and Oncology

## 2016-04-16 VITALS — BP 102/50 | HR 64 | Temp 98.1°F | Resp 18 | Ht 66.0 in | Wt 96.7 lb

## 2016-04-16 VITALS — BP 139/73 | HR 58 | Temp 97.7°F | Resp 16

## 2016-04-16 DIAGNOSIS — D539 Nutritional anemia, unspecified: Secondary | ICD-10-CM | POA: Diagnosis not present

## 2016-04-16 DIAGNOSIS — C099 Malignant neoplasm of tonsil, unspecified: Secondary | ICD-10-CM

## 2016-04-16 DIAGNOSIS — D638 Anemia in other chronic diseases classified elsewhere: Secondary | ICD-10-CM | POA: Diagnosis not present

## 2016-04-16 DIAGNOSIS — E440 Moderate protein-calorie malnutrition: Secondary | ICD-10-CM

## 2016-04-16 DIAGNOSIS — D61818 Other pancytopenia: Secondary | ICD-10-CM

## 2016-04-16 DIAGNOSIS — E039 Hypothyroidism, unspecified: Secondary | ICD-10-CM

## 2016-04-16 DIAGNOSIS — R634 Abnormal weight loss: Secondary | ICD-10-CM | POA: Diagnosis not present

## 2016-04-16 DIAGNOSIS — K861 Other chronic pancreatitis: Secondary | ICD-10-CM

## 2016-04-16 LAB — COMPREHENSIVE METABOLIC PANEL
ALT: 22 U/L (ref 0–55)
ANION GAP: 11 meq/L (ref 3–11)
AST: 34 U/L (ref 5–34)
Albumin: 4.1 g/dL (ref 3.5–5.0)
Alkaline Phosphatase: 92 U/L (ref 40–150)
BUN: 11.4 mg/dL (ref 7.0–26.0)
CALCIUM: 9.3 mg/dL (ref 8.4–10.4)
CHLORIDE: 97 meq/L — AB (ref 98–109)
CO2: 27 mEq/L (ref 22–29)
CREATININE: 0.7 mg/dL (ref 0.7–1.3)
Glucose: 97 mg/dl (ref 70–140)
Potassium: 4.2 mEq/L (ref 3.5–5.1)
Sodium: 135 mEq/L — ABNORMAL LOW (ref 136–145)
Total Bilirubin: 0.5 mg/dL (ref 0.20–1.20)
Total Protein: 7.5 g/dL (ref 6.4–8.3)

## 2016-04-16 LAB — CBC & DIFF AND RETIC
BASO%: 0.4 % (ref 0.0–2.0)
Basophils Absolute: 0 10*3/uL (ref 0.0–0.1)
EOS%: 0.6 % (ref 0.0–7.0)
Eosinophils Absolute: 0 10*3/uL (ref 0.0–0.5)
HCT: 19 % — ABNORMAL LOW (ref 38.4–49.9)
HGB: 6.4 g/dL — CL (ref 13.0–17.1)
Immature Retic Fract: 13.3 % — ABNORMAL HIGH (ref 3.00–10.60)
LYMPH#: 0.5 10*3/uL — AB (ref 0.9–3.3)
LYMPH%: 18.3 % (ref 14.0–49.0)
MCH: 41.7 pg — AB (ref 27.2–33.4)
MCHC: 33.4 g/dL (ref 32.0–36.0)
MCV: 124.9 fL — AB (ref 79.3–98.0)
MONO#: 0.5 10*3/uL (ref 0.1–0.9)
MONO%: 16.3 % — AB (ref 0.0–14.0)
NEUT%: 64.4 % (ref 39.0–75.0)
NEUTROS ABS: 1.8 10*3/uL (ref 1.5–6.5)
PLATELETS: 192 10*3/uL (ref 140–400)
RBC: 1.52 10*6/uL — AB (ref 4.20–5.82)
RDW: 20.4 % — ABNORMAL HIGH (ref 11.0–14.6)
Retic %: 0.99 % (ref 0.80–1.80)
Retic Ct Abs: 15.05 10*3/uL — ABNORMAL LOW (ref 34.80–93.90)
WBC: 2.9 10*3/uL — AB (ref 4.0–10.3)

## 2016-04-16 LAB — PREPARE RBC (CROSSMATCH)

## 2016-04-16 LAB — TSH: TSH: 6.252 m[IU]/L — AB (ref 0.320–4.118)

## 2016-04-16 LAB — CHCC SMEAR

## 2016-04-16 MED ORDER — DIPHENHYDRAMINE HCL 25 MG PO CAPS: 25.0000 mg | ORAL_CAPSULE | Freq: Once | ORAL | Status: DC

## 2016-04-16 MED ORDER — ACETAMINOPHEN 325 MG PO TABS: 650.0000 mg | ORAL_TABLET | Freq: Once | ORAL | Status: DC

## 2016-04-16 MED ORDER — SODIUM CHLORIDE 0.9 % IV SOLN
250.0000 mL | Freq: Once | INTRAVENOUS | Status: AC
  Administered 2016-04-16: 250 mL via INTRAVENOUS

## 2016-04-16 MED ORDER — TRAZODONE HCL 100 MG PO TABS: 100.0000 mg | ORAL_TABLET | Freq: Every day | ORAL | Status: DC

## 2016-04-16 MED ORDER — HYDROMORPHONE HCL 4 MG/ML IJ SOLN
INTRAMUSCULAR | Status: AC
  Filled 2016-04-16: qty 1

## 2016-04-16 MED ORDER — HYDROMORPHONE HCL 4 MG/ML IJ SOLN
1.0000 mg | INTRAMUSCULAR | Status: DC | PRN
  Administered 2016-04-16: 1 mg via INTRAVENOUS

## 2016-04-16 NOTE — Telephone Encounter (Signed)
per pof to sch pt appt-gave pt copy of avs °

## 2016-04-16 NOTE — Patient Instructions (Signed)

## 2016-04-17 LAB — VITAMIN B12: Vitamin B12: 1364 pg/mL — ABNORMAL HIGH (ref 211–946)

## 2016-04-17 LAB — SEDIMENTATION RATE: Sedimentation Rate-Westergren: 36 mm/hr — ABNORMAL HIGH (ref 0–30)

## 2016-04-17 NOTE — Assessment & Plan Note (Signed)
He had recent profound weight loss I will order imaging studies to exclude cancer recurrence and consult dietitian

## 2016-04-17 NOTE — Assessment & Plan Note (Signed)
I am very concerned with his recent progressive weight loss. He had been to the ED in May with hemoptysis I plan to order CT imaging studies to exclude recurrence

## 2016-04-17 NOTE — Assessment & Plan Note (Signed)
He will continue thyroid replacement therapy. I will check TSH

## 2016-04-17 NOTE — Assessment & Plan Note (Signed)
We discussed some of the risks, benefits, and alternatives of blood transfusions. The patient is symptomatic from anemia and the hemoglobin level is critically low.  Some of the side-effects to be expected including risks of transfusion reactions, chills, infection, syndrome of volume overload and risk of hospitalization from various reasons and the patient is willing to proceed and went ahead to sign consent today.  

## 2016-04-17 NOTE — Assessment & Plan Note (Signed)
He has significant pancytopenia I will order additional work-up and transfuse as above No need GCSF for leukopenia

## 2016-04-17 NOTE — Progress Notes (Signed)
[place] OFFICE PROGRESS NOTE  Patient Care Team: Harvie Junior, MD as PCP - General (Specialist) Rozetta Nunnery, MD (Otolaryngology) Kyung Rudd, MD (Radiation Oncology) Lenn Cal, DDS as Consulting Physician (Dentistry) Heath Lark, MD as Consulting Physician (Hematology and Oncology)  SUMMARY OF ONCOLOGIC HISTORY  This is a pleasant patient was found to have abnormal looking tonsil in 2012 by his dentist. 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 He underwent concurrent chemoradiation therapy with weekly cisplatin from 02/01/2011 through 03/22/2011. Chemotherapy was discontinued early due to 2 prolonged pancytopenia. In 2013, he was noted to have enlarging lymphadenopathy. 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 Last imaging study from 10/29/2013 and January 2016 showed no evidence of disease recurrence  INTERVAL HISTORY: Please see below for problem oriented charting. He has lost a lot weight because of that. He denies any dysphagia  He continued to smoke. He denies recent alcohol intake. He denies new lymphadenopathy He went to ED in May with hemoptysis  REVIEW OF SYSTEMS:   Constitutional: Denies fevers, chills Eyes: Denies blurriness of vision Ears, nose, mouth, throat, and face: Denies mucositis or sore throat Respiratory: Denies cough, dyspnea or wheezes Cardiovascular: Denies palpitation, chest discomfort or lower extremity swelling Gastrointestinal:  Denies nausea, heartburn or change in bowel habits Skin: Denies abnormal skin rashes Lymphatics: Denies new lymphadenopathy or easy bruising Neurological:Denies numbness, tingling or new weaknesses Behavioral/Psych: Mood is stable, no new changes  All other  systems were reviewed with the patient and are negative.  I have reviewed the past medical history, past surgical history, social history and family history with the patient and they are unchanged from previous note.  ALLERGIES:  has No Known Allergies.  MEDICATIONS:  Current Outpatient Prescriptions  Medication Sig Dispense Refill  . bisacodyl (DULCOLAX) 5 MG EC tablet Take 5 mg by mouth daily as needed for mild constipation or moderate constipation.    . Cholecalciferol (VITAMIN D PO) Take 1,000 mg by mouth daily.     Marland Kitchen doxylamine, Sleep, (UNISOM) 25 MG tablet Take 75 mg by mouth at bedtime as needed for sleep (sleep).     . folic acid (FOLVITE) 1 MG tablet Take 1 mg by mouth daily.  5  . levothyroxine (SYNTHROID, LEVOTHROID) 75 MCG tablet Take 75 mcg by mouth daily. Reported on 04/16/2016  3  . lipase/protease/amylase (CREON-12/PANCREASE) 12000 UNITS CPEP capsule Take 1 capsule by mouth 4 (four) times daily.    . Omega-3 Fatty Acids (FISH OIL PO) Take 1 capsule by mouth daily.    Marland Kitchen omeprazole (PRILOSEC) 40 MG capsule Take 40 mg by mouth daily.     . ondansetron (ZOFRAN) 4 MG tablet TK 1 T PO BID N  2  . oxyCODONE (ROXICODONE) 15 MG immediate release tablet Take 30 mg by mouth every 6 (six) hours as needed for pain.     Marland Kitchen PROAIR HFA 108 (90 Base) MCG/ACT inhaler INL 2 PUFFS PO QID PRN FOR SOB  5  . promethazine (PHENERGAN) 25 MG tablet Take 25 mg by mouth 2 (two) times daily as needed.  1  . traZODone (DESYREL) 100 MG tablet Take 1 tablet (100 mg total) by mouth at bedtime. 30 tablet 6   Current Facility-Administered Medications  Medication Dose Route Frequency Provider Last Rate Last Dose  .  HYDROmorphone (DILAUDID) injection 1 mg  1 mg Intravenous Q2H PRN Heath Lark, MD   1 mg at 04/16/16 1507    PHYSICAL EXAMINATION: ECOG PERFORMANCE STATUS: 1  Filed Vitals:   04/16/16 1239  BP: 102/50  Pulse: 64  Temp: 98.1 F (36.7 C)  Resp: 18   Filed Weights   04/16/16 1239  Weight:  96 lb 11.2 oz (43.863 kg)    GENERAL:alert, no distress and comfortable. He is very thin and cachectic SKIN: skin color, texture, turgor are normal, no rashes or significant lesions EYES: normal, Conjunctiva are pink and non-injected, sclera clear OROPHARYNX:no exudate, no erythema and lips, buccal mucosa, and tongue normal  NECK: supple, thyroid normal size, non-tender, without nodularity LYMPH:  no palpable lymphadenopathy in the cervical, axillary or inguinal LUNGS: clear to auscultation and percussion with normal breathing effort HEART: regular rate & rhythm and no murmurs and no lower extremity edema ABDOMEN:abdomen soft, non-tender and normal bowel sounds Musculoskeletal:no cyanosis of digits and no clubbing  NEURO: alert & oriented x 3 with fluent speech, no focal motor/sensory deficits  LABORATORY DATA:  I have reviewed the data as listed    Component Value Date/Time   NA 135* 04/16/2016 1220   NA 133* 02/17/2016 2033   NA 140 04/24/2012 1200   K 4.2 04/16/2016 1220   K 3.6 02/17/2016 2033   K 4.1 04/24/2012 1200   CL 95* 02/17/2016 2033   CL 102 02/27/2013 1045   CL 98 04/24/2012 1200   CO2 27 04/16/2016 1220   CO2 30 02/17/2016 2033   CO2 30 04/24/2012 1200   GLUCOSE 97 04/16/2016 1220   GLUCOSE 105* 02/17/2016 2033   GLUCOSE 118* 02/27/2013 1045   GLUCOSE 102 04/24/2012 1200   BUN 11.4 04/16/2016 1220   BUN 19 02/17/2016 2033   BUN 13 04/24/2012 1200   CREATININE 0.7 04/16/2016 1220   CREATININE 0.74 02/17/2016 2033   CREATININE 0.6 04/24/2012 1200   CALCIUM 9.3 04/16/2016 1220   CALCIUM 9.2 02/17/2016 2033   CALCIUM 9.6 04/24/2012 1200   PROT 7.5 04/16/2016 1220   PROT 6.9 02/17/2016 2033   PROT 8.1 04/24/2012 1200   ALBUMIN 4.1 04/16/2016 1220   ALBUMIN 4.4 02/17/2016 2033   ALBUMIN 3.6 04/24/2012 1200   AST 34 04/16/2016 1220   AST 41 02/17/2016 2033   AST 30 04/24/2012 1200   ALT 22 04/16/2016 1220   ALT 31 02/17/2016 2033   ALT 27 04/24/2012 1200    ALKPHOS 92 04/16/2016 1220   ALKPHOS 64 02/17/2016 2033   ALKPHOS 77 04/24/2012 1200   BILITOT 0.50 04/16/2016 1220   BILITOT 0.5 02/17/2016 2033   BILITOT 0.40 04/24/2012 1200   GFRNONAA >60 02/17/2016 2033   GFRAA >60 02/17/2016 2033    No results found for: SPEP, UPEP  Lab Results  Component Value Date   WBC 2.9* 04/16/2016   NEUTROABS 1.8 04/16/2016   HGB 6.4* 04/16/2016   HCT 19.0* 04/16/2016   MCV 124.9* 04/16/2016   PLT 192 04/16/2016      Chemistry      Component Value Date/Time   NA 135* 04/16/2016 1220   NA 133* 02/17/2016 2033   NA 140 04/24/2012 1200   K 4.2 04/16/2016 1220   K 3.6 02/17/2016 2033   K 4.1 04/24/2012 1200   CL 95* 02/17/2016 2033   CL 102 02/27/2013 1045   CL 98 04/24/2012 1200   CO2 27 04/16/2016 1220   CO2 30 02/17/2016 2033  CO2 30 04/24/2012 1200   BUN 11.4 04/16/2016 1220   BUN 19 02/17/2016 2033   BUN 13 04/24/2012 1200   CREATININE 0.7 04/16/2016 1220   CREATININE 0.74 02/17/2016 2033   CREATININE 0.6 04/24/2012 1200      Component Value Date/Time   CALCIUM 9.3 04/16/2016 1220   CALCIUM 9.2 02/17/2016 2033   CALCIUM 9.6 04/24/2012 1200   ALKPHOS 92 04/16/2016 1220   ALKPHOS 64 02/17/2016 2033   ALKPHOS 77 04/24/2012 1200   AST 34 04/16/2016 1220   AST 41 02/17/2016 2033   AST 30 04/24/2012 1200   ALT 22 04/16/2016 1220   ALT 31 02/17/2016 2033   ALT 27 04/24/2012 1200   BILITOT 0.50 04/16/2016 1220   BILITOT 0.5 02/17/2016 2033   BILITOT 0.40 04/24/2012 1200      ASSESSMENT & PLAN:  Malignant neoplasm of tonsil I am very concerned with his recent progressive weight loss. He had been to the ED in May with hemoptysis I plan to order CT imaging studies to exclude recurrence  Deficiency anemia We discussed some of the risks, benefits, and alternatives of blood transfusions. The patient is symptomatic from anemia and the hemoglobin level is critically low.  Some of the side-effects to be expected including risks of  transfusion reactions, chills, infection, syndrome of volume overload and risk of hospitalization from various reasons and the patient is willing to proceed and went ahead to sign consent today.   Hypothyroidism (acquired) He will continue thyroid replacement therapy. I will check TSH  Pancytopenia ([place]) He has significant pancytopenia I will order additional work-up and transfuse as above No need GCSF for leukopenia  Malnutrition of moderate degree ([place]) He had recent profound weight loss I will order imaging studies to exclude cancer recurrence and consult dietitian   Orders Placed This Encounter  Procedures  . CT CHEST W CONTRAST    Standing Status: Future     Number of Occurrences: 1     Standing Expiration Date: 06/16/2017    Order Specific Question:  Reason for Exam (SYMPTOM  OR DIAGNOSIS REQUIRED)    Answer:  staging CT, Hx tonsil cancer, exclude recurrence    Order Specific Question:  Preferred imaging location?    Answer:  [facility]  . CT ABDOMEN PELVIS W CONTRAST    Standing Status: Future     Number of Occurrences:      Standing Expiration Date: 07/17/2017    Order Specific Question:  Reason for Exam (SYMPTOM  OR DIAGNOSIS REQUIRED)    Answer:  staging CT, Hx tonsil cancer, exclude recurrence    Order Specific Question:  Preferred imaging location?    Answer:  [facility]  . CT SOFT TISSUE NECK W CONTRAST    Standing Status: Future     Number of Occurrences:      Standing Expiration Date: 07/17/2017    Order Specific Question:  Reason for Exam (SYMPTOM  OR DIAGNOSIS REQUIRED)    Answer:  staging CT, Hx tonsil cancer, exclude recurrence    Order Specific Question:  Preferred imaging location?    Answer:  [facility]  . CBC & Diff and Retic    Standing Status: Future     Number of Occurrences:      Standing Expiration Date: 05/21/2017  . Hold Tube, Blood Bank    Standing Status: Future     Number of Occurrences:      Standing  Expiration Date: 05/21/2017   All  questions were answered. The patient knows to call the clinic with any problems, questions or concerns. No barriers to learning was detected. I spent 30 minutes counseling the patient face to face. The total time spent in the appointment was 45 minutes and more than 50% was on counseling and review of test results     [facility], [place], MD 04/17/2016 3:15 PM

## 2016-04-18 ENCOUNTER — Other Ambulatory Visit: Payer: Self-pay | Admitting: Hematology and Oncology

## 2016-04-18 DIAGNOSIS — C099 Malignant neoplasm of tonsil, unspecified: Secondary | ICD-10-CM

## 2016-04-19 LAB — TYPE AND SCREEN
ABO/RH(D): A POS
Antibody Screen: NEGATIVE
Unit division: 0

## 2016-04-21 ENCOUNTER — Telehealth: Payer: Self-pay | Admitting: Hematology and Oncology

## 2016-04-21 NOTE — Telephone Encounter (Signed)
called 3 times to confirm added NUT apt. Pt does not have voicemail set up. Apt will be mailed

## 2016-04-23 ENCOUNTER — Encounter (HOSPITAL_COMMUNITY): Payer: Self-pay

## 2016-04-23 ENCOUNTER — Ambulatory Visit (HOSPITAL_COMMUNITY)
Admission: RE | Admit: 2016-04-23 | Discharge: 2016-04-23 | Disposition: A | Discharge status: Home / Self Care | Payer: Medicaid Other | Source: Ambulatory Visit | Attending: Hematology and Oncology | Admitting: Hematology and Oncology

## 2016-04-23 DIAGNOSIS — Z9889 Other specified postprocedural states: Secondary | ICD-10-CM | POA: Insufficient documentation

## 2016-04-23 DIAGNOSIS — C099 Malignant neoplasm of tonsil, unspecified: Secondary | ICD-10-CM | POA: Diagnosis present

## 2016-04-23 DIAGNOSIS — R634 Abnormal weight loss: Secondary | ICD-10-CM

## 2016-04-23 MED ORDER — IOPAMIDOL (ISOVUE-300) INJECTION 61%
75.0000 mL | Freq: Once | INTRAVENOUS | Status: AC | PRN
  Administered 2016-04-23: 75 mL via INTRAVENOUS

## 2016-04-28 ENCOUNTER — Ambulatory Visit (HOSPITAL_BASED_OUTPATIENT_CLINIC_OR_DEPARTMENT_OTHER): Payer: Medicaid Other | Admitting: Hematology and Oncology

## 2016-04-28 ENCOUNTER — Other Ambulatory Visit: Payer: Self-pay

## 2016-04-28 ENCOUNTER — Other Ambulatory Visit: Payer: Self-pay | Admitting: *Deleted

## 2016-04-28 ENCOUNTER — Other Ambulatory Visit (HOSPITAL_BASED_OUTPATIENT_CLINIC_OR_DEPARTMENT_OTHER): Payer: Medicaid Other

## 2016-04-28 ENCOUNTER — Other Ambulatory Visit: Payer: Self-pay | Admitting: Physician Assistant

## 2016-04-28 ENCOUNTER — Telehealth: Payer: Self-pay | Admitting: Hematology and Oncology

## 2016-04-28 ENCOUNTER — Encounter: Payer: Self-pay | Admitting: Hematology and Oncology

## 2016-04-28 ENCOUNTER — Ambulatory Visit: Payer: Medicaid Other | Admitting: Nutrition

## 2016-04-28 ENCOUNTER — Telehealth: Payer: Self-pay | Admitting: *Deleted

## 2016-04-28 VITALS — BP 122/72 | HR 77 | Temp 97.6°F | Resp 18 | Ht 66.0 in | Wt 94.7 lb

## 2016-04-28 DIAGNOSIS — E440 Moderate protein-calorie malnutrition: Secondary | ICD-10-CM | POA: Diagnosis not present

## 2016-04-28 DIAGNOSIS — C099 Malignant neoplasm of tonsil, unspecified: Secondary | ICD-10-CM

## 2016-04-28 DIAGNOSIS — K861 Other chronic pancreatitis: Secondary | ICD-10-CM | POA: Diagnosis not present

## 2016-04-28 DIAGNOSIS — D61818 Other pancytopenia: Secondary | ICD-10-CM

## 2016-04-28 DIAGNOSIS — D638 Anemia in other chronic diseases classified elsewhere: Secondary | ICD-10-CM

## 2016-04-28 DIAGNOSIS — G893 Neoplasm related pain (acute) (chronic): Secondary | ICD-10-CM

## 2016-04-28 DIAGNOSIS — I878 Other specified disorders of veins: Secondary | ICD-10-CM

## 2016-04-28 HISTORY — DX: Other specified disorders of veins: I87.8

## 2016-04-28 LAB — CBC & DIFF AND RETIC
BASO%: 0.3 % (ref 0.0–2.0)
BASOS ABS: 0 10*3/uL (ref 0.0–0.1)
EOS ABS: 0 10*3/uL (ref 0.0–0.5)
EOS%: 0.6 % (ref 0.0–7.0)
HEMATOCRIT: 22.7 % — AB (ref 38.4–49.9)
HEMOGLOBIN: 7.8 g/dL — AB (ref 13.0–17.1)
Immature Retic Fract: 17.3 % — ABNORMAL HIGH (ref 3.00–10.60)
LYMPH%: 7.9 % — AB (ref 14.0–49.0)
MCH: 38.6 pg — AB (ref 27.2–33.4)
MCHC: 34.4 g/dL (ref 32.0–36.0)
MCV: 112.4 fL — AB (ref 79.3–98.0)
MONO#: 0.5 10*3/uL (ref 0.1–0.9)
MONO%: 14.6 % — AB (ref 0.0–14.0)
NEUT#: 2.5 10*3/uL (ref 1.5–6.5)
NEUT%: 76.6 % — ABNORMAL HIGH (ref 39.0–75.0)
Platelets: 180 10*3/uL (ref 140–400)
RBC: 2.02 10*6/uL — ABNORMAL LOW (ref 4.20–5.82)
RDW: 25.1 % — ABNORMAL HIGH (ref 11.0–14.6)
Retic %: 1.99 % — ABNORMAL HIGH (ref 0.80–1.80)
Retic Ct Abs: 40.2 10*3/uL (ref 34.80–93.90)
WBC: 3.3 10*3/uL — AB (ref 4.0–10.3)
lymph#: 0.3 10*3/uL — ABNORMAL LOW (ref 0.9–3.3)

## 2016-04-28 LAB — PREPARE RBC (CROSSMATCH)

## 2016-04-28 MED ORDER — OXYCODONE-ACETAMINOPHEN 5-325 MG PO TABS
2.0000 | ORAL_TABLET | Freq: Once | ORAL | Status: AC
  Administered 2016-04-28: 2 via ORAL

## 2016-04-28 MED ORDER — OXYCODONE-ACETAMINOPHEN 5-325 MG PO TABS
ORAL_TABLET | ORAL | Status: AC
  Filled 2016-04-28: qty 2

## 2016-04-28 NOTE — Progress Notes (Signed)
[place] OFFICE PROGRESS NOTE  Patient Care Team: Harvie Junior, MD as PCP - General (Specialist) Rozetta Nunnery, MD (Otolaryngology) Kyung Rudd, MD (Radiation Oncology) Lenn Cal, DDS as Consulting Physician (Dentistry) Heath Lark, MD as Consulting Physician (Hematology and Oncology)  SUMMARY OF ONCOLOGIC HISTORY:   Malignant neoplasm of tonsil [facility])   08/20/2011 Initial Diagnosis    Malignant neoplasm of tonsil ([place])     04/23/2016 Imaging    CT neck is negative. CT chest showed marked emphysema with progression of the irregular bandlike and nodular opacities in the posterior lower lobes of the been present since at least 06/04/2014. Abnormal limited abdominal structures      INTERVAL HISTORY: Please see below for problem oriented charting. He returns for further follow-up. He complained of severe epigastric tenderness. Had occasional nausea but no vomiting. Denies constipation. He is requesting refills of pain medicine and temazepam which I declined. I have consulted dietitian. He has poor appetite. He denies further hemoptysis The patient denies any recent signs or symptoms of bleeding such as spontaneous epistaxis, hematuria or hematochezia.  REVIEW OF SYSTEMS:   Constitutional: Denies fevers, chills  Eyes: Denies blurriness of vision Ears, nose, mouth, throat, and face: Denies mucositis or sore throat Respiratory: Denies cough, dyspnea or wheezes Cardiovascular: Denies palpitation, chest discomfort or lower extremity swelling Skin: Denies abnormal skin rashes Lymphatics: Denies new lymphadenopathy or easy bruising Neurological:Denies numbness, tingling or new weaknesses Behavioral/Psych: Mood is stable, no new changes  All other systems were reviewed with the patient and are negative.  I have reviewed the past medical history, past surgical history, social history and family history with the patient and they are unchanged from  previous note.  ALLERGIES:  has No Known Allergies.  MEDICATIONS:  Current Outpatient Prescriptions  Medication Sig Dispense Refill  . bisacodyl (DULCOLAX) 5 MG EC tablet Take 5 mg by mouth daily as needed for mild constipation or moderate constipation.    . Cholecalciferol (VITAMIN D PO) Take 1,000 mg by mouth daily.     Marland Kitchen doxylamine, Sleep, (UNISOM) 25 MG tablet Take 75 mg by mouth at bedtime as needed for sleep (sleep).     . folic acid (FOLVITE) 1 MG tablet Take 1 mg by mouth daily.  5  . levothyroxine (SYNTHROID, LEVOTHROID) 75 MCG tablet Take 75 mcg by mouth daily. Reported on 04/16/2016  3  . lipase/protease/amylase (CREON-12/PANCREASE) 12000 UNITS CPEP capsule Take 1 capsule by mouth 4 (four) times daily.    . Omega-3 Fatty Acids (FISH OIL PO) Take 1 capsule by mouth daily.    Marland Kitchen omeprazole (PRILOSEC) 40 MG capsule Take 40 mg by mouth daily.     . ondansetron (ZOFRAN) 4 MG tablet TK 1 T PO BID N  2  . oxyCODONE (ROXICODONE) 15 MG immediate release tablet Take 30 mg by mouth every 6 (six) hours as needed for pain.     Marland Kitchen PROAIR HFA 108 (90 Base) MCG/ACT inhaler INL 2 PUFFS PO QID PRN FOR SOB  5  . promethazine (PHENERGAN) 25 MG tablet Take 25 mg by mouth 2 (two) times daily as needed.  1  . traZODone (DESYREL) 100 MG tablet Take 1 tablet (100 mg total) by mouth at bedtime. 30 tablet 6   No current facility-administered medications for this visit.     PHYSICAL EXAMINATION: ECOG PERFORMANCE STATUS: 1 - Symptomatic but completely ambulatory  Vitals:   04/28/16 1107  BP: 122/72  Pulse: 77  Resp: 18  Temp: 97.6 F (36.4 C)   Filed Weights   04/28/16 1107  Weight: 94 lb 11.2 oz (43 kg)    GENERAL:alert, no distress and comfortable. He looks thin and cachectic SKIN: skin color, texture, turgor are normal, no rashes or significant lesions EYES: normal, Conjunctiva are pale and non-injected, sclera clear OROPHARYNX:no exudate, no erythema and lips, buccal mucosa, and tongue  normal  NECK: supple, thyroid normal size, non-tender, without nodularity LYMPH:  no palpable lymphadenopathy in the cervical, axillary or inguinal LUNGS: clear to auscultation and percussion with normal breathing effort HEART: regular rate & rhythm and no murmurs and no lower extremity edema ABDOMEN:abdomen soft, significant tenderness in the epigastrium region Musculoskeletal:no cyanosis of digits and no clubbing  NEURO: alert & oriented x 3 with fluent speech, no focal motor/sensory deficits  LABORATORY DATA:  I have reviewed the data as listed    Component Value Date/Time   NA 135 (L) 04/16/2016 1220   K 4.2 04/16/2016 1220   CL 95 (L) 02/17/2016 2033   CL 102 02/27/2013 1045   CO2 27 04/16/2016 1220   GLUCOSE 97 04/16/2016 1220   GLUCOSE 118 (H) 02/27/2013 1045   BUN 11.4 04/16/2016 1220   CREATININE 0.7 04/16/2016 1220   CALCIUM 9.3 04/16/2016 1220   PROT 7.5 04/16/2016 1220   ALBUMIN 4.1 04/16/2016 1220   AST 34 04/16/2016 1220   ALT 22 04/16/2016 1220   ALKPHOS 92 04/16/2016 1220   BILITOT 0.50 04/16/2016 1220   GFRNONAA >60 02/17/2016 2033   GFRAA >60 02/17/2016 2033    No results found for: SPEP, UPEP  Lab Results  Component Value Date   WBC 3.3 (L) 04/28/2016   NEUTROABS 2.5 04/28/2016   HGB 7.8 (L) 04/28/2016   HCT 22.7 (L) 04/28/2016   MCV 112.4 (H) 04/28/2016   PLT 180 04/28/2016      Chemistry      Component Value Date/Time   NA 135 (L) 04/16/2016 1220   K 4.2 04/16/2016 1220   CL 95 (L) 02/17/2016 2033   CL 102 02/27/2013 1045   CO2 27 04/16/2016 1220   BUN 11.4 04/16/2016 1220   CREATININE 0.7 04/16/2016 1220      Component Value Date/Time   CALCIUM 9.3 04/16/2016 1220   ALKPHOS 92 04/16/2016 1220   AST 34 04/16/2016 1220   ALT 22 04/16/2016 1220   BILITOT 0.50 04/16/2016 1220       RADIOGRAPHIC STUDIES:I reviewed a CT scan of the neck and the chest I have personally reviewed the radiological images as listed and agreed with the  findings in the report.    ASSESSMENT & PLAN:  Malignant neoplasm of tonsil I am very concerned with his recent progressive weight loss. He had been to the ED in May with hemoptysis CT scan showed no signs of local recurrence. However, that does not explain his weight loss. I will proceed to order a CT scan of the abdomen for further evaluation  Pancytopenia [facility]) He has significant pancytopenia We discussed some of the risks, benefits, and alternatives of blood transfusions. The patient is symptomatic from anemia and the hemoglobin level is critically low.  Some of the side-effects to be expected including risks of transfusion reactions, chills, infection, syndrome of volume overload and risk of hospitalization from various reasons and the patient is willing to proceed and went ahead to sign consent today.   Malnutrition of moderate degree ([facility]) He had recent profound weight loss I will order imaging studies  of the abdomen to exclude cancer recurrence and consult dietitian  Chronic pancreatitis [facility]) He has profound weight loss. He had history of pancreatitis. CT imaging study show abnormal scan and further information is needed. I have spent some time getting insurance preapproval to get CT scan of the abdomen and pelvis with contrast. He has persistent pain and I think the most likely cause of his weight loss and pain is likely pancreatitis  Poor venous access He has poor venous access. I am not able to give him blood transfusion. We will arrange for port placement     Orders Placed This Encounter  Procedures  . IR Fluoro Guide CV Line Right    Indicate type of CVC ordering    Standing Status:   Future    Standing Expiration Date:   06/29/2017    Order Specific Question:   Reason for exam:    Answer:   need PICC line for IV access    Order Specific Question:   Preferred Imaging Location?    Answer:   [facility]  . CBC with Differential/Platelet    Standing  Status:   Future    Standing Expiration Date:   07/17/2017  . Comprehensive metabolic panel    Standing Status:   Future    Standing Expiration Date:   07/17/2017  . Hold Tube, Blood Bank    Standing Status:   Future    Standing Expiration Date:   06/02/2017   All questions were answered. The patient knows to call the clinic with any problems, questions or concerns. No barriers to learning was detected. I spent 30 minutes counseling the patient face to face. The total time spent in the appointment was 40 minutes and more than 50% was on counseling and review of test results     [facility], Elaura Calix, MD 04/28/2016 5:32 PM

## 2016-04-28 NOTE — Progress Notes (Signed)
Nutrition follow-up completed with patient who is familiar to me from past visit.  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  Patient reports he consumes blenderized foods twice a day. He also drinks between 6 and 8 cans of Ensure Plus a day. Reports he drinks water frequently but does not know how much. Weight continues to decline with weight documented as 94.7 pounds on July 26 which is decreased from 96.7 pounds July 14. Patient is cachectic with severe malnutrition.  Cachexia is often difficult to reverse with increased nutrition.  Estimated nutrition needs: 1600-1850 calories, 75-85 grams protein, 1.8 L fluid.  6 cans of Ensure Plus provides 2100 cal, 78 g protein.  If patient consumes a cans of Ensure Plus.  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  Intervention: Patient was educated to continue blenderized food along with 6-8 cans of Ensure Plus daily. Provided one complementary case of Ensure Plus. Provided patient with coupons. Provided support and encouragement for patient to continue strategies for increased oral intake.  Monitoring, evaluation, goals: Patient will continue to tolerate increased oral intake to meet estimated nutrition needs for increase quality-of-life.  Next visit: To be scheduled as needed.

## 2016-04-28 NOTE — Progress Notes (Signed)
Pt escorted to exam room 25 for 1 unit PRBC. Unable to establish patent IV line with 6 attempts. Discussed with Dr. Alvy Bimler. Pt will be scheduled for PICC insertion tomorrow and transfusion will be postponed until after. Pt agrees to this plan.  Pt has been scheduled for PICC at IR 04/29/16 at 8am and blood transfusion at [facility] following 915am. Pt is aware of plan and agrees to times.   Pt family member called to pick patient up. Patient escorted to waiting room via wheelchair.

## 2016-04-28 NOTE — Telephone Encounter (Signed)
called pt to notify about blood transfusion for 8/2... no answer will mail apt

## 2016-04-28 NOTE — Telephone Encounter (Signed)
Gave pt cal & avs °

## 2016-04-28 NOTE — Telephone Encounter (Signed)
Per staff message and POF I have scheduled appts. Advised scheduler of appts. JMW  

## 2016-04-29 ENCOUNTER — Encounter (HOSPITAL_COMMUNITY): Payer: Self-pay | Admitting: Interventional Radiology

## 2016-04-29 ENCOUNTER — Ambulatory Visit (HOSPITAL_COMMUNITY)
Admission: RE | Admit: 2016-04-29 | Discharge: 2016-04-29 | Disposition: A | Discharge status: Home / Self Care | Payer: Medicaid Other | Source: Ambulatory Visit | Attending: Hematology and Oncology | Admitting: Hematology and Oncology

## 2016-04-29 ENCOUNTER — Telehealth: Payer: Self-pay | Admitting: Hematology and Oncology

## 2016-04-29 ENCOUNTER — Ambulatory Visit (HOSPITAL_COMMUNITY)
Admission: RE | Admit: 2016-04-29 | Discharge: 2016-04-29 | Disposition: A | Discharge status: Home / Self Care | Payer: Medicaid Other | Source: Ambulatory Visit | Attending: Specialist | Admitting: Specialist

## 2016-04-29 ENCOUNTER — Other Ambulatory Visit: Payer: Self-pay | Admitting: *Deleted

## 2016-04-29 ENCOUNTER — Telehealth: Payer: Self-pay | Admitting: *Deleted

## 2016-04-29 ENCOUNTER — Encounter: Payer: Self-pay | Admitting: Nutrition

## 2016-04-29 ENCOUNTER — Other Ambulatory Visit: Payer: Self-pay | Admitting: Hematology and Oncology

## 2016-04-29 DIAGNOSIS — K861 Other chronic pancreatitis: Secondary | ICD-10-CM | POA: Insufficient documentation

## 2016-04-29 DIAGNOSIS — I878 Other specified disorders of veins: Secondary | ICD-10-CM

## 2016-04-29 DIAGNOSIS — D638 Anemia in other chronic diseases classified elsewhere: Secondary | ICD-10-CM | POA: Diagnosis not present

## 2016-04-29 HISTORY — PX: IR GENERIC HISTORICAL: IMG1180011

## 2016-04-29 MED ORDER — LIDOCAINE HCL 1 % IJ SOLN
INTRAMUSCULAR | Status: DC | PRN
  Administered 2016-04-29: 5 mL

## 2016-04-29 MED ORDER — HEPARIN SOD (PORK) LOCK FLUSH 100 UNIT/ML IV SOLN
250.0000 [IU] | INTRAVENOUS | Status: AC | PRN
  Administered 2016-04-29: 250 [IU]
  Filled 2016-04-29: qty 5

## 2016-04-29 MED ORDER — LIDOCAINE HCL 1 % IJ SOLN
INTRAMUSCULAR | Status: AC
  Filled 2016-04-29: qty 20

## 2016-04-29 MED ORDER — SODIUM CHLORIDE 0.9% FLUSH
10.0000 mL | INTRAVENOUS | Status: AC | PRN
  Administered 2016-04-29: 10 mL

## 2016-04-29 MED ORDER — HEPARIN SOD (PORK) LOCK FLUSH 100 UNIT/ML IV SOLN
500.0000 [IU] | Freq: Every day | INTRAVENOUS | Status: AC | PRN
  Administered 2016-04-29: 250 [IU]

## 2016-04-29 MED ORDER — DIPHENHYDRAMINE HCL 25 MG PO CAPS
25.0000 mg | ORAL_CAPSULE | Freq: Once | ORAL | Status: AC
  Administered 2016-04-29: 25 mg via ORAL
  Filled 2016-04-29: qty 1

## 2016-04-29 MED ORDER — ACETAMINOPHEN 325 MG PO TABS
650.0000 mg | ORAL_TABLET | Freq: Once | ORAL | Status: AC
  Administered 2016-04-29: 650 mg via ORAL
  Filled 2016-04-29: qty 2

## 2016-04-29 MED ORDER — SODIUM CHLORIDE 0.9% FLUSH: 3.0000 mL | INTRAVENOUS | Status: DC | PRN

## 2016-04-29 MED ORDER — SODIUM CHLORIDE 0.9 % IV SOLN
250.0000 mL | Freq: Once | INTRAVENOUS | Status: AC
  Administered 2016-04-29: 250 mL via INTRAVENOUS

## 2016-04-29 NOTE — Assessment & Plan Note (Signed)
He has profound weight loss. He had history of pancreatitis. CT imaging study show abnormal scan and further information is needed. I have spent some time getting insurance preapproval to get CT scan of the abdomen and pelvis with contrast. He has persistent pain and I think the most likely cause of his weight loss and pain is likely pancreatitis

## 2016-04-29 NOTE — Telephone Encounter (Signed)
Triage RN states pt returned call and was notified of [facility] appt tomorrow.

## 2016-04-29 NOTE — Telephone Encounter (Signed)
This pt does not have a voicemail, the phone is off because when I call it immediately says the mailbox has not been set up yet, I have tried twice.  The appointments have been added per pof, I will try again later.

## 2016-04-29 NOTE — Assessment & Plan Note (Signed)
He has poor venous access. I am not able to give him blood transfusion. We will arrange for port placement

## 2016-04-29 NOTE — Assessment & Plan Note (Signed)
I am very concerned with his recent progressive weight loss. He had been to the ED in May with hemoptysis CT scan showed no signs of local recurrence. However, that does not explain his weight loss. I will proceed to order a CT scan of the abdomen for further evaluation

## 2016-04-29 NOTE — Progress Notes (Signed)
Patient ID: Russell Williamson, male   DOB: Nov 25, 1955, 60 y.o.   MRN: [id number] Medical Provider: Heath Lark, MD  Associated Diagnosis: Other chronic pancreatitis ([place]) (K86.1  Procedure: Transfusion of 1 unit of PRBC. Tolerated transfusion well. No reaction.  Went over discharge instructions and copy given to patient. Ambulatory to wheelchair. Alert and oriented. Has all valuables. Discharged to home.

## 2016-04-29 NOTE — Assessment & Plan Note (Signed)
He had recent profound weight loss I will order imaging studies of the abdomen to exclude cancer recurrence and consult dietitian

## 2016-04-29 NOTE — Assessment & Plan Note (Signed)
He has significant pancytopenia We discussed some of the risks, benefits, and alternatives of blood transfusions. The patient is symptomatic from anemia and the hemoglobin level is critically low.  Some of the side-effects to be expected including risks of transfusion reactions, chills, infection, syndrome of volume overload and risk of hospitalization from various reasons and the patient is willing to proceed and went ahead to sign consent today.

## 2016-04-29 NOTE — Procedures (Signed)
Interventional Radiology Procedure Note  Procedure: Placement of a right brachial approach double lumen PowerPicc.  Midline position.  Tip is positioned at the axillary vein and catheter is ready for immediate use.   Findings: The wire would not thread through the distal right brachiocephalic vein, with occlusion at the first rib/clavicle overlap, at thoracic inlet.  Maneuvers with upper extremity unsuccessful.   Complications: None Recommendations:  - Ok to shower tomorrow - Do not submerge - Routine line care  - Recommend left PIcc on any further attempt/need.  Signed,  Dulcy Fanny. Earleen Newport, DO

## 2016-04-29 NOTE — Discharge Instructions (Signed)
Blood Transfusion  A blood transfusion is a procedure in which you receive donated blood through an IV tube. You may need a blood transfusion because of illness, surgery, or injury. The blood may come from a donor, or it may be your own blood that you donated previously. The blood given in a transfusion is made up of different types of cells. You may receive:  Red blood cells. These carry oxygen and replace lost blood.  Platelets. These control bleeding.  Plasma. Thishelps blood to clot. If you have hemophilia or another clotting disorder, you may also receive other types of blood products. LET YOUR HEALTH CARE PROVIDER KNOW ABOUT:  Any allergies you have.  All medicines you are taking, including vitamins, herbs, eye drops, creams, and over-the-counter medicines.  Previous problems you or members of your family have had with the use of anesthetics.  Any blood disorders you have.  Previous surgeries you have had.  Any medical conditions you may have.  Any previous reactions you have had during a blood transfusion.  RISKS AND COMPLICATIONS Generally, this is a safe procedure. However, problems may occur, including:  Having an allergic reaction to something in the donated blood.  Fever. This may be a reaction to the white blood cells in the transfused blood.  Iron overload. This can happen from having many transfusions.  Transfusion-related acute lung injury (TRALI). This is a rare reaction that causes lung damage. The cause is not known.TRALI can occur within hours of a transfusion or several days later.  Sudden (acute) or delayed hemolytic reactions. This happens if your blood does not match the cells in your transfusion. Your body's defense system (immune system) may try to attack the new cells. This complication is rare.  Infection. This is rare. BEFORE THE PROCEDURE  You may have a blood test to determine your blood type. This is necessary to know what kind of blood your  body will accept.  If you are going to have a planned surgery, you may donate your own blood. This may be done in case you need to have a transfusion.  If you have had an allergic reaction to a transfusion in the past, you may be given medicine to help prevent a reaction. Take this medicine only as directed by your health care provider.  You will have your temperature, blood pressure, and pulse monitored before the transfusion. PROCEDURE   An IV will be started in your hand or arm.  The bag of donated blood will be attached to your IV tube and given into your vein.  Your temperature, blood pressure, and pulse will be monitored regularly during the transfusion. This monitoring is done to detect early signs of a transfusion reaction.  If you have any signs or symptoms of a reaction, your transfusion will be stopped and you may be given medicine.  When the transfusion is over, your IV will be removed.  Pressure may be applied to the IV site for a few minutes.  A bandage (dressing) will be applied. The procedure may vary among health care providers and hospitals. AFTER THE PROCEDURE  Your blood pressure, temperature, and pulse will be monitored regularly.   This information is not intended to replace advice given to you by your health care provider. Make sure you discuss any questions you have with your health care provider.   Document Released: 09/17/2000 Document Revised: 10/11/2014 Document Reviewed: 07/31/2014 Elsevier Interactive Patient Education 2016 Elsevier Inc[employer] Blood Transfusion, Care After Refer to this sheet   in the next few weeks. These instructions provide you with information about caring for yourself after your procedure. Your health care provider may also give you more specific instructions. Your treatment has been planned according to current medical practices, but problems sometimes occur. Call your health care provider if you have any problems or questions after your  procedure. WHAT TO EXPECT AFTER THE PROCEDURE After your procedure, it is common to have: Bruising and soreness at the IV site. Chills or fever. Headache. HOME CARE INSTRUCTIONS Take medicines only as directed by your health care provider. Ask your health care provider if you can take an over-the-counter pain reliever in case you have a fever or headache a day or two after your transfusion. Return to your normal activities as directed by your health care provider. SEEK MEDICAL CARE IF:  You develop redness or irritation at your IV site. You have persistent fever, chills, or headache. Your urine is darker than normal. Your urine turns pink, red, or brown.  The white part of your eye turns yellow (jaundice).  You feel weak after doing your normal activities.  SEEK IMMEDIATE MEDICAL CARE IF:  You have trouble breathing. You have fever and chills along with: Anxiety. Chest or back pain. Flushed skin. Clammy skin. A rapid heartbeat. Nausea.   This information is not intended to replace advice given to you by your health care provider. Make sure you discuss any questions you have with your health care provider.   Document Released: 10/11/2014 Document Reviewed: 10/11/2014 Elsevier Interactive Patient Education 2016 Elsevier Inc[employer]  

## 2016-04-30 LAB — TYPE AND SCREEN
ABO/RH(D): A POS
Antibody Screen: NEGATIVE
UNIT DIVISION: 0

## 2016-05-01 ENCOUNTER — Ambulatory Visit (HOSPITAL_BASED_OUTPATIENT_CLINIC_OR_DEPARTMENT_OTHER): Payer: Medicaid Other

## 2016-05-01 ENCOUNTER — Telehealth: Payer: Self-pay | Admitting: *Deleted

## 2016-05-01 VITALS — BP 122/78 | HR 75 | Temp 98.3°F | Resp 18

## 2016-05-01 DIAGNOSIS — Z452 Encounter for adjustment and management of vascular access device: Secondary | ICD-10-CM

## 2016-05-01 DIAGNOSIS — C099 Malignant neoplasm of tonsil, unspecified: Secondary | ICD-10-CM

## 2016-05-01 MED ORDER — SODIUM CHLORIDE 0.9% FLUSH
10.0000 mL | INTRAVENOUS | Status: DC | PRN
Start: 2016-05-01 — End: 2016-05-01
  Administered 2016-05-01: 10 mL via INTRAVENOUS
  Filled 2016-05-01: qty 10

## 2016-05-01 MED ORDER — HEPARIN SOD (PORK) LOCK FLUSH 100 UNIT/ML IV SOLN
500.0000 [IU] | Freq: Once | INTRAVENOUS | Status: AC
  Administered 2016-05-01: 500 [IU] via INTRAVENOUS
  Filled 2016-05-01: qty 5

## 2016-05-01 NOTE — Patient Instructions (Signed)
PICC Home Guide A peripherally inserted central catheter (PICC) is a long, thin, flexible tube that is inserted into a vein in the upper arm. It is a form of intravenous (IV) access. It is considered to be a "central" line because the tip of the PICC ends in a large vein in your chest. This large vein is called the superior vena cava (SVC). The PICC tip ends in the SVC because there is a lot of blood flow in the SVC. This allows medicines and IV fluids to be quickly distributed throughout the body. The PICC is inserted using a sterile technique by a specially trained nurse or physician. After the PICC is inserted, a chest X-ray exam is done to be sure it is in the correct place.  A PICC may be placed for different reasons, such as:  To give medicines and liquid nutrition that can only be given through a central line. Examples are:  Certain antibiotic treatments.  Chemotherapy.  Total parenteral nutrition (TPN).  To take frequent blood samples.  To give IV fluids and blood products.  If there is difficulty placing a peripheral intravenous (PIV) catheter. If taken care of properly, a PICC can remain in place for several months. A PICC can also allow a person to go home from the hospital early. Medicine and PICC care can be managed at home by a family member or home health care team. WHAT PROBLEMS CAN HAPPEN WHEN I HAVE A PICC? Problems with a PICC can occasionally occur. These may include the following:  A blood clot (thrombus) forming in or at the tip of the PICC. This can cause the PICC to become clogged. A clot-dissolving medicine called tissue plasminogen activator (tPA) can be given through the PICC to help break up the clot.  Inflammation of the vein (phlebitis) in which the PICC is placed. Signs of inflammation may include redness, pain at the insertion site, red streaks, or being able to feel a "cord" in the vein where the PICC is located.  Infection in the PICC or at the insertion  site. Signs of infection may include fever, chills, redness, swelling, or pus drainage from the PICC insertion site.  PICC movement (malposition). The PICC tip may move from its original position due to excessive physical activity, forceful coughing, sneezing, or vomiting.  A break or cut in the PICC. It is important to not use scissors near the PICC.  Nerve or tendon irritation or injury during PICC insertion. WHAT SHOULD I KEEP IN MIND ABOUT ACTIVITIES WHEN I HAVE A PICC?  You may bend your arm and move it freely. If your PICC is near or at the bend of your elbow, avoid activity with repeated motion at the elbow.  Rest at home for the remainder of the day following PICC line insertion.  Avoid lifting heavy objects as instructed by your health care provider.  Avoid using a crutch with the arm on the same side as your PICC. You may need to use a walker. WHAT SHOULD I KNOW ABOUT MY PICC DRESSING?  Keep your PICC bandage (dressing) clean and dry to prevent infection.  Ask your health care provider when you may shower. Ask your health care provider to teach you how to wrap the PICC when you do take a shower.  Change the PICC dressing as instructed by your health care provider.  Change your PICC dressing if it becomes loose or wet. WHAT SHOULD I KNOW ABOUT PICC CARE?  Check the PICC insertion site   daily for leakage, redness, swelling, or pain.  Do not take a bath, swim, or use hot tubs when you have a PICC. Cover PICC line with clear plastic wrap and tape to keep it dry while showering.  Flush the PICC as directed by your health care provider. Let your health care provider know right away if the PICC is difficult to flush or does not flush. Do not use force to flush the PICC.  Do not use a syringe that is less than 10 mL to flush the PICC.  Never pull or tug on the PICC.  Avoid blood pressure checks on the arm with the PICC.  Keep your PICC identification card with you at all  times.  Do not take the PICC out yourself. Only a trained clinical professional should remove the PICC. SEEK IMMEDIATE MEDICAL CARE IF:  Your PICC is accidentally pulled all the way out. If this happens, cover the insertion site with a bandage or gauze dressing. Do not throw the PICC away. Your health care provider will need to inspect it.  Your PICC was tugged or pulled and has partially come out. Do not  push the PICC back in.  There is any type of drainage, redness, or swelling where the PICC enters the skin.  You cannot flush the PICC, it is difficult to flush, or the PICC leaks around the insertion site when it is flushed.  You hear a "flushing" sound when the PICC is flushed.  You have pain, discomfort, or numbness in your arm, shoulder, or jaw on the same side as the PICC.  You feel your heart "racing" or skipping beats.  You notice a hole or tear in the PICC.  You develop chills or a fever. MAKE SURE YOU:   Understand these instructions.  Will watch your condition.  Will get help right away if you are not doing well or get worse.   This information is not intended to replace advice given to you by your health care provider. Make sure you discuss any questions you have with your health care provider.   Document Released: 03/27/2003 Document Revised: 10/11/2014 Document Reviewed: 05/28/2013 Elsevier Interactive Patient Education 2016 Elsevier Inc.  

## 2016-05-03 ENCOUNTER — Ambulatory Visit (HOSPITAL_BASED_OUTPATIENT_CLINIC_OR_DEPARTMENT_OTHER): Payer: Medicaid Other

## 2016-05-03 VITALS — BP 122/67 | HR 87 | Temp 99.9°F | Resp 18

## 2016-05-03 DIAGNOSIS — Z452 Encounter for adjustment and management of vascular access device: Secondary | ICD-10-CM

## 2016-05-03 DIAGNOSIS — C099 Malignant neoplasm of tonsil, unspecified: Secondary | ICD-10-CM | POA: Diagnosis not present

## 2016-05-03 MED ORDER — HEPARIN SOD (PORK) LOCK FLUSH 100 UNIT/ML IV SOLN
500.0000 [IU] | Freq: Once | INTRAVENOUS | Status: AC
  Administered 2016-05-03: 500 [IU] via INTRAVENOUS
  Filled 2016-05-03: qty 5

## 2016-05-03 MED ORDER — SODIUM CHLORIDE 0.9% FLUSH
10.0000 mL | INTRAVENOUS | Status: DC | PRN
  Administered 2016-05-03: 10 mL via INTRAVENOUS
  Filled 2016-05-03: qty 10

## 2016-05-03 NOTE — Progress Notes (Signed)
Pt reported to the flush room for picc line flush. Pt reports new swelling to his neck and chin area on right side. He states he has been experiencing pain to this right arm since Sunday. Picc dressing soiled, blood stained. Not actively bleeding. Pt reports he has fallen on this arm a few times getting out of bed. Further investigation provided patient sleeps on a mattress on the floor. Pt states "it was a roll out of bed and not a real fall down" Discussed interventions with Dr. Alvy Bimler who agreed to plan to flush and change dressing. Swelling noted probably related to head/neck radiation.  PICC dressing changed. Picc insertion site cleaned. No active drainage noted. Discussed picc line care with patient during dressing change. Applied OP site due to pt sensitivity to removal of [place].   Both lumen flushed without complication. No blood returned on either lumen. Pt reports no pain. Pt understands to return to the clinic in the interm with any concerns or questions. Pt verbalized an understanding and confirms appt on August 2 at [facility].

## 2016-05-04 ENCOUNTER — Ambulatory Visit (HOSPITAL_COMMUNITY)
Admission: RE | Admit: 2016-05-04 | Discharge: 2016-05-04 | Disposition: A | Discharge status: Home / Self Care | Payer: Medicaid Other | Source: Ambulatory Visit | Attending: Hematology and Oncology | Admitting: Hematology and Oncology

## 2016-05-04 DIAGNOSIS — D539 Nutritional anemia, unspecified: Secondary | ICD-10-CM

## 2016-05-05 ENCOUNTER — Ambulatory Visit (HOSPITAL_BASED_OUTPATIENT_CLINIC_OR_DEPARTMENT_OTHER): Payer: Medicaid Other | Admitting: Hematology and Oncology

## 2016-05-05 ENCOUNTER — Other Ambulatory Visit (HOSPITAL_BASED_OUTPATIENT_CLINIC_OR_DEPARTMENT_OTHER): Payer: Medicaid Other

## 2016-05-05 ENCOUNTER — Other Ambulatory Visit: Payer: Self-pay

## 2016-05-05 ENCOUNTER — Telehealth: Payer: Self-pay | Admitting: Hematology and Oncology

## 2016-05-05 ENCOUNTER — Encounter: Payer: Self-pay | Admitting: Hematology and Oncology

## 2016-05-05 ENCOUNTER — Ambulatory Visit (HOSPITAL_BASED_OUTPATIENT_CLINIC_OR_DEPARTMENT_OTHER): Payer: Medicaid Other

## 2016-05-05 VITALS — BP 91/59 | HR 77 | Temp 98.5°F | Resp 17 | Wt 98.5 lb

## 2016-05-05 DIAGNOSIS — D61818 Other pancytopenia: Secondary | ICD-10-CM | POA: Diagnosis not present

## 2016-05-05 DIAGNOSIS — C099 Malignant neoplasm of tonsil, unspecified: Secondary | ICD-10-CM

## 2016-05-05 DIAGNOSIS — K861 Other chronic pancreatitis: Secondary | ICD-10-CM

## 2016-05-05 DIAGNOSIS — R634 Abnormal weight loss: Secondary | ICD-10-CM

## 2016-05-05 DIAGNOSIS — Z95828 Presence of other vascular implants and grafts: Secondary | ICD-10-CM | POA: Insufficient documentation

## 2016-05-05 DIAGNOSIS — E440 Moderate protein-calorie malnutrition: Secondary | ICD-10-CM

## 2016-05-05 DIAGNOSIS — D638 Anemia in other chronic diseases classified elsewhere: Secondary | ICD-10-CM

## 2016-05-05 DIAGNOSIS — D539 Nutritional anemia, unspecified: Secondary | ICD-10-CM

## 2016-05-05 LAB — COMPREHENSIVE METABOLIC PANEL
ALBUMIN: 3.9 g/dL (ref 3.5–5.0)
ALK PHOS: 106 U/L (ref 40–150)
ALT: 35 U/L (ref 0–55)
AST: 39 U/L — AB (ref 5–34)
Anion Gap: 10 mEq/L (ref 3–11)
BILIRUBIN TOTAL: 0.51 mg/dL (ref 0.20–1.20)
BUN: 9.8 mg/dL (ref 7.0–26.0)
CALCIUM: 9.4 mg/dL (ref 8.4–10.4)
CO2: 30 mEq/L — ABNORMAL HIGH (ref 22–29)
CREATININE: 0.7 mg/dL (ref 0.7–1.3)
Chloride: 94 mEq/L — ABNORMAL LOW (ref 98–109)
EGFR: >90 mL/min/{1.73_m2} (ref >90)
Glucose: 97 mg/dl (ref 70–140)
Potassium: 4.5 mEq/L (ref 3.5–5.1)
Sodium: 134 mEq/L — ABNORMAL LOW (ref 136–145)
TOTAL PROTEIN: 7.6 g/dL (ref 6.4–8.3)

## 2016-05-05 LAB — CBC WITH DIFFERENTIAL/PLATELET
BASO%: 0.3 % (ref 0.0–2.0)
Basophils Absolute: 0 10*3/uL (ref 0.0–0.1)
EOS%: 0.6 % (ref 0.0–7.0)
Eosinophils Absolute: 0 10*3/uL (ref 0.0–0.5)
HEMATOCRIT: 25.4 % — AB (ref 38.4–49.9)
HEMOGLOBIN: 8.4 g/dL — AB (ref 13.0–17.1)
LYMPH#: 0.3 10*3/uL — AB (ref 0.9–3.3)
LYMPH%: 5.8 % — ABNORMAL LOW (ref 14.0–49.0)
MCH: 37.6 pg — ABNORMAL HIGH (ref 27.2–33.4)
MCHC: 33.2 g/dL (ref 32.0–36.0)
MCV: 113.1 fL — ABNORMAL HIGH (ref 79.3–98.0)
MONO#: 0.8 10*3/uL (ref 0.1–0.9)
MONO%: 15.1 % — ABNORMAL HIGH (ref 0.0–14.0)
NEUT#: 4.3 10*3/uL (ref 1.5–6.5)
NEUT%: 78.2 % — AB (ref 39.0–75.0)
PLATELETS: 225 10*3/uL (ref 140–400)
RBC: 2.24 10*6/uL — ABNORMAL LOW (ref 4.20–5.82)
RDW: 27.9 % — AB (ref 11.0–14.6)
WBC: 5.5 10*3/uL (ref 4.0–10.3)

## 2016-05-05 MED ORDER — SODIUM CHLORIDE 0.9 % IJ SOLN
10.0000 mL | INTRAMUSCULAR | Status: DC | PRN
  Administered 2016-05-05: 10 mL via INTRAVENOUS
  Filled 2016-05-05: qty 10

## 2016-05-05 NOTE — Assessment & Plan Note (Signed)
He has significant pancytopenia I will order further workup He does not need transfusion today

## 2016-05-05 NOTE — Assessment & Plan Note (Signed)
I am very concerned with his recent progressive weight loss. He had been to the ED in May with hemoptysis CT scan showed no signs of local recurrence. However, that does not explain his weight loss. I will proceed to order a CT scan of the abdomen for further evaluation

## 2016-05-05 NOTE — Progress Notes (Signed)
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 CT chest showed marked emphysema with progression of the irregular bandlike and nodular opacities in the posterior lower lobes of the been present since at least 06/04/2014. Abnormal limited abdominal structures     This is a pleasant patient was found to have abnormal looking tonsil in 2012 by his dentist. 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 He underwent concurrent chemoradiation therapy with weekly cisplatin from 02/01/2011 through 03/22/2011. Chemotherapy was discontinued early due to 2 prolonged pancytopenia. In 2013, he was noted to have enlarging lymphadenopathy. 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 Last imaging study from 10/29/2013 and January 2016 showed no evidence of disease recurrence   INTERVAL HISTORY: Please see below for problem oriented charting. Unfortunately, CT scan was not done or ordered. He continues to have persistent pain. He continues to smoke, attempting to quit smoking. He was seen by dietitian. Denies swallowing problems. The patient denies any recent signs or symptoms of bleeding such as spontaneous epistaxis, hematuria or hematochezia. He felt a little stronger after recent blood transfusion  REVIEW OF SYSTEMS:    Constitutional: Denies fevers, chills or abnormal weight loss Eyes: Denies blurriness of vision Ears, nose, mouth, throat, and face: Denies mucositis or sore throat Respiratory: Denies cough, dyspnea or wheezes Cardiovascular: Denies palpitation, chest discomfort or lower extremity swelling Gastrointestinal:  Denies nausea, heartburn or change in bowel habits Skin: Denies abnormal skin rashes Lymphatics: Denies new lymphadenopathy or easy bruising Neurological:Denies numbness, tingling or new weaknesses Behavioral/Psych: Mood is stable, no new changes  All other systems were reviewed with the patient and are negative.  I have reviewed the past medical history, past surgical history, social history and family history with the patient and they are unchanged from previous note.  ALLERGIES:  has No Known Allergies.  MEDICATIONS:  Current Outpatient Prescriptions  Medication Sig Dispense Refill  . bisacodyl (DULCOLAX) 5 MG EC tablet Take 5 mg by mouth daily as needed for mild constipation or moderate constipation.    . Cholecalciferol (VITAMIN D PO) Take 1,000 mg by mouth daily.     Marland Kitchen doxylamine, Sleep, (UNISOM) 25 MG tablet Take 75 mg by mouth at bedtime as needed for sleep (sleep).     . folic acid (FOLVITE) 1 MG tablet Take 1 mg by mouth daily.  5  . levothyroxine (SYNTHROID, LEVOTHROID) 75 MCG tablet Take 75 mcg by mouth daily. Reported on 04/16/2016  3  . lipase/protease/amylase (CREON-12/PANCREASE) 12000 UNITS CPEP capsule Take 1 capsule by mouth 4 (four) times daily.    . Omega-3 Fatty Acids (FISH OIL PO) Take 1 capsule by mouth daily.    Marland Kitchen omeprazole (PRILOSEC) 40 MG capsule Take 40 mg by mouth daily.     . ondansetron (ZOFRAN) 4 MG tablet TK 1 T PO BID N  2  . oxyCODONE (ROXICODONE) 15 MG immediate release  tablet Take 30 mg by mouth every 6 (six) hours as needed for pain.     Marland Kitchen PROAIR HFA 108 (90 Base) MCG/ACT inhaler INL 2 PUFFS PO QID PRN FOR SOB  5  . promethazine (PHENERGAN) 25  MG tablet Take 25 mg by mouth 2 (two) times daily as needed.  1  . traZODone (DESYREL) 100 MG tablet Take 1 tablet (100 mg total) by mouth at bedtime. 30 tablet 6   No current facility-administered medications for this visit.     PHYSICAL EXAMINATION: ECOG PERFORMANCE STATUS: 1 - Symptomatic but completely ambulatory  Vitals:   05/05/16 1121  BP: (!) 91/59  Pulse: 77  Resp: 17  Temp: 98.5 F (36.9 C)   Filed Weights   05/05/16 1121  Weight: 98 lb 8 oz (44.7 kg)    GENERAL:alert, no distress and comfortable. He looks extremely thin and cachectic SKIN: skin color, texture, turgor are normal, no rashes or significant lesions EYES: normal, Conjunctiva are pink and non-injected, sclera clear Musculoskeletal:no cyanosis of digits and no clubbing  NEURO: alert & oriented x 3 with fluent speech, no focal motor/sensory deficits  LABORATORY DATA:  I have reviewed the data as listed    Component Value Date/Time   NA 134 (L) 05/05/2016 1015   K 4.5 05/05/2016 1015   CL 95 (L) 02/17/2016 2033   CL 102 02/27/2013 1045   CO2 30 (H) 05/05/2016 1015   GLUCOSE 97 05/05/2016 1015   GLUCOSE 118 (H) 02/27/2013 1045   BUN 9.8 05/05/2016 1015   CREATININE 0.7 05/05/2016 1015   CALCIUM 9.4 05/05/2016 1015   PROT 7.6 05/05/2016 1015   ALBUMIN 3.9 05/05/2016 1015   AST 39 (H) 05/05/2016 1015   ALT 35 05/05/2016 1015   ALKPHOS 106 05/05/2016 1015   BILITOT 0.51 05/05/2016 1015   GFRNONAA >60 02/17/2016 2033   GFRAA >60 02/17/2016 2033    No results found for: SPEP, UPEP  Lab Results  Component Value Date   WBC 5.5 05/05/2016   NEUTROABS 4.3 05/05/2016   HGB 8.4 (L) 05/05/2016   HCT 25.4 (L) 05/05/2016   MCV 113.1 (H) 05/05/2016   PLT 225 05/05/2016      Chemistry      Component Value Date/Time   NA 134 (L) 05/05/2016 1015   K 4.5 05/05/2016 1015   CL 95 (L) 02/17/2016 2033   CL 102 02/27/2013 1045   CO2 30 (H) 05/05/2016 1015   BUN 9.8 05/05/2016 1015   CREATININE 0.7  05/05/2016 1015      Component Value Date/Time   CALCIUM 9.4 05/05/2016 1015   ALKPHOS 106 05/05/2016 1015   AST 39 (H) 05/05/2016 1015   ALT 35 05/05/2016 1015   BILITOT 0.51 05/05/2016 1015      ASSESSMENT & PLAN:  Malignant neoplasm of tonsil I am very concerned with his recent progressive weight loss. He had been to the ED in May with hemoptysis CT scan showed no signs of local recurrence. However, that does not explain his weight loss. I will proceed to order a CT scan of the abdomen for further evaluation  Pancytopenia [facility]) He has significant pancytopenia I will order further workup He does not need transfusion today  Chronic pancreatitis ([place]) He has profound weight loss. He had history of pancreatitis. Last CT imaging study show abnormal scan and further information is needed. I have spent some time getting insurance preapproval to get CT scan of the abdomen and pelvis with contrast. He  has persistent pain and I think the most likely cause of his weight loss and pain is likely pancreatitis  Malnutrition of moderate degree ([place]) He had recent profound weight loss I will order imaging studies of the abdomen to exclude cancer recurrence and consult dietitian   Orders Placed This Encounter  Procedures  . CBC & Diff and Retic    Standing Status:   Standing    Number of Occurrences:   9    Standing Expiration Date:   05/05/2017  . Ferritin    Standing Status:   Future    Standing Expiration Date:   06/09/2017  . Iron and TIBC    Standing Status:   Future    Standing Expiration Date:   06/09/2017  . Erythropoietin    Standing Status:   Future    Standing Expiration Date:   06/09/2017  . Haptoglobin    Standing Status:   Future    Standing Expiration Date:   06/09/2017  . Sedimentation rate    Standing Status:   Future    Standing Expiration Date:   06/09/2017  . Direct antiglobulin test (not at [facility])    Standing Status:   Future    Standing Expiration Date:   06/09/2017    All questions were answered. The patient knows to call the clinic with any problems, questions or concerns. No barriers to learning was detected. I spent 15 minutes counseling the patient face to face. The total time spent in the appointment was 20 minutes and more than 50% was on counseling and review of test results     [facility], [place], MD 05/05/2016 3:36 PM

## 2016-05-05 NOTE — Telephone Encounter (Signed)
Gave pt cal & avs °

## 2016-05-05 NOTE — Assessment & Plan Note (Signed)
He had recent profound weight loss I will order imaging studies of the abdomen to exclude cancer recurrence and consult dietitian

## 2016-05-05 NOTE — Patient Instructions (Addendum)
PICC Home Guide A peripherally inserted central catheter (PICC) is a long, thin, flexible tube that is inserted into a vein in the upper arm. It is a form of intravenous (IV) access. It is considered to be a "central" line because the tip of the PICC ends in a large vein in your chest. This large vein is called the superior vena cava (SVC). The PICC tip ends in the SVC because there is a lot of blood flow in the SVC. This allows medicines and IV fluids to be quickly distributed throughout the body. The PICC is inserted using a sterile technique by a specially trained nurse or physician. After the PICC is inserted, a chest X-ray exam is done to be sure it is in the correct place.  A PICC may be placed for different reasons, such as:  To give medicines and liquid nutrition that can only be given through a central line. Examples are:  Certain antibiotic treatments.  Chemotherapy.  Total parenteral nutrition (TPN).  To take frequent blood samples.  To give IV fluids and blood products.  If there is difficulty placing a peripheral intravenous (PIV) catheter. If taken care of properly, a PICC can remain in place for several months. A PICC can also allow a person to go home from the hospital early. Medicine and PICC care can be managed at home by a family member or home health care team. WHAT PROBLEMS CAN HAPPEN WHEN I HAVE A PICC? Problems with a PICC can occasionally occur. These may include the following:  A blood clot (thrombus) forming in or at the tip of the PICC. This can cause the PICC to become clogged. A clot-dissolving medicine called tissue plasminogen activator (tPA) can be given through the PICC to help break up the clot.  Inflammation of the vein (phlebitis) in which the PICC is placed. Signs of inflammation may include redness, pain at the insertion site, red streaks, or being able to feel a "cord" in the vein where the PICC is located.  Infection in the PICC or at the insertion  site. Signs of infection may include fever, chills, redness, swelling, or pus drainage from the PICC insertion site.  PICC movement (malposition). The PICC tip may move from its original position due to excessive physical activity, forceful coughing, sneezing, or vomiting.  A break or cut in the PICC. It is important to not use scissors near the PICC.  Nerve or tendon irritation or injury during PICC insertion. WHAT SHOULD I KEEP IN MIND ABOUT ACTIVITIES WHEN I HAVE A PICC?  You may bend your arm and move it freely. If your PICC is near or at the bend of your elbow, avoid activity with repeated motion at the elbow.  Rest at home for the remainder of the day following PICC line insertion.  Avoid lifting heavy objects as instructed by your health care provider.  Avoid using a crutch with the arm on the same side as your PICC. You may need to use a walker. WHAT SHOULD I KNOW ABOUT MY PICC DRESSING?  Keep your PICC bandage (dressing) clean and dry to prevent infection.  Ask your health care provider when you may shower. Ask your health care provider to teach you how to wrap the PICC when you do take a shower.  Change the PICC dressing as instructed by your health care provider.  Change your PICC dressing if it becomes loose or wet. WHAT SHOULD I KNOW ABOUT PICC CARE?  Check the PICC insertion site   daily for leakage, redness, swelling, or pain.  Do not take a bath, swim, or use hot tubs when you have a PICC. Cover PICC line with clear plastic wrap and tape to keep it dry while showering.  Flush the PICC as directed by your health care provider. Let your health care provider know right away if the PICC is difficult to flush or does not flush. Do not use force to flush the PICC.  Do not use a syringe that is less than 10 mL to flush the PICC.  Never pull or tug on the PICC.  Avoid blood pressure checks on the arm with the PICC.  Keep your PICC identification card with you at all  times.  Do not take the PICC out yourself. Only a trained clinical professional should remove the PICC. SEEK IMMEDIATE MEDICAL CARE IF:  Your PICC is accidentally pulled all the way out. If this happens, cover the insertion site with a bandage or gauze dressing. Do not throw the PICC away. Your health care provider will need to inspect it.  Your PICC was tugged or pulled and has partially come out. Do not  push the PICC back in.  There is any type of drainage, redness, or swelling where the PICC enters the skin.  You cannot flush the PICC, it is difficult to flush, or the PICC leaks around the insertion site when it is flushed.  You hear a "flushing" sound when the PICC is flushed.  You have pain, discomfort, or numbness in your arm, shoulder, or jaw on the same side as the PICC.  You feel your heart "racing" or skipping beats.  You notice a hole or tear in the PICC.  You develop chills or a fever. MAKE SURE YOU:   Understand these instructions.  Will watch your condition.  Will get help right away if you are not doing well or get worse.   This information is not intended to replace advice given to you by your health care provider. Make sure you discuss any questions you have with your health care provider.   Document Released: 03/27/2003 Document Revised: 10/11/2014 Document Reviewed: 05/28/2013 Elsevier Interactive Patient Education 2016 [employer]  

## 2016-05-05 NOTE — Assessment & Plan Note (Signed)
He has profound weight loss. He had history of pancreatitis. Last CT imaging study show abnormal scan and further information is needed. I have spent some time getting insurance preapproval to get CT scan of the abdomen and pelvis with contrast. He has persistent pain and I think the most likely cause of his weight loss and pain is likely pancreatitis

## 2016-05-06 ENCOUNTER — Encounter: Payer: Self-pay | Admitting: *Deleted

## 2016-05-07 ENCOUNTER — Ambulatory Visit (HOSPITAL_COMMUNITY)
Admission: RE | Admit: 2016-05-07 | Discharge: 2016-05-07 | Disposition: A | Discharge status: Home / Self Care | Payer: Medicaid Other | Source: Ambulatory Visit | Attending: Hematology and Oncology | Admitting: Hematology and Oncology

## 2016-05-07 ENCOUNTER — Other Ambulatory Visit: Payer: Self-pay | Admitting: *Deleted

## 2016-05-07 ENCOUNTER — Ambulatory Visit (HOSPITAL_BASED_OUTPATIENT_CLINIC_OR_DEPARTMENT_OTHER): Payer: Medicaid Other

## 2016-05-07 ENCOUNTER — Other Ambulatory Visit: Payer: Self-pay | Admitting: Hematology and Oncology

## 2016-05-07 ENCOUNTER — Encounter (HOSPITAL_COMMUNITY): Payer: Self-pay

## 2016-05-07 DIAGNOSIS — K861 Other chronic pancreatitis: Secondary | ICD-10-CM

## 2016-05-07 DIAGNOSIS — C099 Malignant neoplasm of tonsil, unspecified: Secondary | ICD-10-CM

## 2016-05-07 DIAGNOSIS — Z452 Encounter for adjustment and management of vascular access device: Secondary | ICD-10-CM

## 2016-05-07 DIAGNOSIS — R634 Abnormal weight loss: Secondary | ICD-10-CM

## 2016-05-07 DIAGNOSIS — Z01818 Encounter for other preprocedural examination: Secondary | ICD-10-CM | POA: Insufficient documentation

## 2016-05-07 DIAGNOSIS — D539 Nutritional anemia, unspecified: Secondary | ICD-10-CM | POA: Diagnosis not present

## 2016-05-07 MED ORDER — HEPARIN SOD (PORK) LOCK FLUSH 100 UNIT/ML IV SOLN
500.0000 [IU] | Freq: Once | INTRAVENOUS | Status: AC | PRN
  Administered 2016-05-07: 500 [IU] via INTRAVENOUS
  Filled 2016-05-07: qty 5

## 2016-05-07 MED ORDER — SODIUM CHLORIDE 0.9 % IJ SOLN
10.0000 mL | INTRAMUSCULAR | Status: DC | PRN
  Administered 2016-05-07: 10 mL via INTRAVENOUS
  Filled 2016-05-07: qty 10

## 2016-05-07 MED ORDER — IOPAMIDOL (ISOVUE-300) INJECTION 61%: 100.0000 mL | Freq: Once | INTRAVENOUS | Status: DC | PRN

## 2016-05-07 NOTE — Patient Instructions (Signed)
PICC Home Guide A peripherally inserted central catheter (PICC) is a long, thin, flexible tube that is inserted into a vein in the upper arm. It is a form of intravenous (IV) access. It is considered to be a "central" line because the tip of the PICC ends in a large vein in your chest. This large vein is called the superior vena cava (SVC). The PICC tip ends in the SVC because there is a lot of blood flow in the SVC. This allows medicines and IV fluids to be quickly distributed throughout the body. The PICC is inserted using a sterile technique by a specially trained nurse or physician. After the PICC is inserted, a chest X-ray exam is done to be sure it is in the correct place.  A PICC may be placed for different reasons, such as:  To give medicines and liquid nutrition that can only be given through a central line. Examples are:  Certain antibiotic treatments.  Chemotherapy.  Total parenteral nutrition (TPN).  To take frequent blood samples.  To give IV fluids and blood products.  If there is difficulty placing a peripheral intravenous (PIV) catheter. If taken care of properly, a PICC can remain in place for several months. A PICC can also allow a person to go home from the hospital early. Medicine and PICC care can be managed at home by a family member or home health care team. WHAT PROBLEMS CAN HAPPEN WHEN I HAVE A PICC? Problems with a PICC can occasionally occur. These may include the following:  A blood clot (thrombus) forming in or at the tip of the PICC. This can cause the PICC to become clogged. A clot-dissolving medicine called tissue plasminogen activator (tPA) can be given through the PICC to help break up the clot.  Inflammation of the vein (phlebitis) in which the PICC is placed. Signs of inflammation may include redness, pain at the insertion site, red streaks, or being able to feel a "cord" in the vein where the PICC is located.  Infection in the PICC or at the insertion  site. Signs of infection may include fever, chills, redness, swelling, or pus drainage from the PICC insertion site.  PICC movement (malposition). The PICC tip may move from its original position due to excessive physical activity, forceful coughing, sneezing, or vomiting.  A break or cut in the PICC. It is important to not use scissors near the PICC.  Nerve or tendon irritation or injury during PICC insertion. WHAT SHOULD I KEEP IN MIND ABOUT ACTIVITIES WHEN I HAVE A PICC?  You may bend your arm and move it freely. If your PICC is near or at the bend of your elbow, avoid activity with repeated motion at the elbow.  Rest at home for the remainder of the day following PICC line insertion.  Avoid lifting heavy objects as instructed by your health care provider.  Avoid using a crutch with the arm on the same side as your PICC. You may need to use a walker. WHAT SHOULD I KNOW ABOUT MY PICC DRESSING?  Keep your PICC bandage (dressing) clean and dry to prevent infection.  Ask your health care provider when you may shower. Ask your health care provider to teach you how to wrap the PICC when you do take a shower.  Change the PICC dressing as instructed by your health care provider.  Change your PICC dressing if it becomes loose or wet. WHAT SHOULD I KNOW ABOUT PICC CARE?  Check the PICC insertion site   daily for leakage, redness, swelling, or pain.  Do not take a bath, swim, or use hot tubs when you have a PICC. Cover PICC line with clear plastic wrap and tape to keep it dry while showering.  Flush the PICC as directed by your health care provider. Let your health care provider know right away if the PICC is difficult to flush or does not flush. Do not use force to flush the PICC.  Do not use a syringe that is less than 10 mL to flush the PICC.  Never pull or tug on the PICC.  Avoid blood pressure checks on the arm with the PICC.  Keep your PICC identification card with you at all  times.  Do not take the PICC out yourself. Only a trained clinical professional should remove the PICC. SEEK IMMEDIATE MEDICAL CARE IF:  Your PICC is accidentally pulled all the way out. If this happens, cover the insertion site with a bandage or gauze dressing. Do not throw the PICC away. Your health care provider will need to inspect it.  Your PICC was tugged or pulled and has partially come out. Do not  push the PICC back in.  There is any type of drainage, redness, or swelling where the PICC enters the skin.  You cannot flush the PICC, it is difficult to flush, or the PICC leaks around the insertion site when it is flushed.  You hear a "flushing" sound when the PICC is flushed.  You have pain, discomfort, or numbness in your arm, shoulder, or jaw on the same side as the PICC.  You feel your heart "racing" or skipping beats.  You notice a hole or tear in the PICC.  You develop chills or a fever. MAKE SURE YOU:   Understand these instructions.  Will watch your condition.  Will get help right away if you are not doing well or get worse.   This information is not intended to replace advice given to you by your health care provider. Make sure you discuss any questions you have with your health care provider.   Document Released: 03/27/2003 Document Revised: 10/11/2014 Document Reviewed: 05/28/2013 Elsevier Interactive Patient Education 2016 Elsevier Inc.  

## 2016-05-10 ENCOUNTER — Ambulatory Visit (HOSPITAL_BASED_OUTPATIENT_CLINIC_OR_DEPARTMENT_OTHER): Payer: Medicaid Other

## 2016-05-10 VITALS — BP 108/54 | HR 100 | Temp 99.0°F | Resp 18

## 2016-05-10 DIAGNOSIS — Z95828 Presence of other vascular implants and grafts: Secondary | ICD-10-CM

## 2016-05-10 DIAGNOSIS — C099 Malignant neoplasm of tonsil, unspecified: Secondary | ICD-10-CM | POA: Diagnosis not present

## 2016-05-10 DIAGNOSIS — Z452 Encounter for adjustment and management of vascular access device: Secondary | ICD-10-CM | POA: Diagnosis present

## 2016-05-10 MED ORDER — SODIUM CHLORIDE 0.9% FLUSH
10.0000 mL | INTRAVENOUS | Status: DC | PRN
  Administered 2016-05-10: 10 mL via INTRAVENOUS
  Filled 2016-05-10: qty 10

## 2016-05-10 MED ORDER — HEPARIN SOD (PORK) LOCK FLUSH 100 UNIT/ML IV SOLN
500.0000 [IU] | Freq: Once | INTRAVENOUS | Status: AC
  Administered 2016-05-10: 500 [IU] via INTRAVENOUS
  Filled 2016-05-10: qty 5

## 2016-05-10 NOTE — Patient Instructions (Signed)
PICC Home Guide A peripherally inserted central catheter (PICC) is a long, thin, flexible tube that is inserted into a vein in the upper arm. It is a form of intravenous (IV) access. It is considered to be a "central" line because the tip of the PICC ends in a large vein in your chest. This large vein is called the superior vena cava (SVC). The PICC tip ends in the SVC because there is a lot of blood flow in the SVC. This allows medicines and IV fluids to be quickly distributed throughout the body. The PICC is inserted using a sterile technique by a specially trained nurse or physician. After the PICC is inserted, a chest X-ray exam is done to be sure it is in the correct place.  A PICC may be placed for different reasons, such as:  To give medicines and liquid nutrition that can only be given through a central line. Examples are:  Certain antibiotic treatments.  Chemotherapy.  Total parenteral nutrition (TPN).  To take frequent blood samples.  To give IV fluids and blood products.  If there is difficulty placing a peripheral intravenous (PIV) catheter. If taken care of properly, a PICC can remain in place for several months. A PICC can also allow a person to go home from the hospital early. Medicine and PICC care can be managed at home by a family member or home health care team. WHAT PROBLEMS CAN HAPPEN WHEN I HAVE A PICC? Problems with a PICC can occasionally occur. These may include the following:  A blood clot (thrombus) forming in or at the tip of the PICC. This can cause the PICC to become clogged. A clot-dissolving medicine called tissue plasminogen activator (tPA) can be given through the PICC to help break up the clot.  Inflammation of the vein (phlebitis) in which the PICC is placed. Signs of inflammation may include redness, pain at the insertion site, red streaks, or being able to feel a "cord" in the vein where the PICC is located.  Infection in the PICC or at the insertion  site. Signs of infection may include fever, chills, redness, swelling, or pus drainage from the PICC insertion site.  PICC movement (malposition). The PICC tip may move from its original position due to excessive physical activity, forceful coughing, sneezing, or vomiting.  A break or cut in the PICC. It is important to not use scissors near the PICC.  Nerve or tendon irritation or injury during PICC insertion. WHAT SHOULD I KEEP IN MIND ABOUT ACTIVITIES WHEN I HAVE A PICC?  You may bend your arm and move it freely. If your PICC is near or at the bend of your elbow, avoid activity with repeated motion at the elbow.  Rest at home for the remainder of the day following PICC line insertion.  Avoid lifting heavy objects as instructed by your health care provider.  Avoid using a crutch with the arm on the same side as your PICC. You may need to use a walker. WHAT SHOULD I KNOW ABOUT MY PICC DRESSING?  Keep your PICC bandage (dressing) clean and dry to prevent infection.  Ask your health care provider when you may shower. Ask your health care provider to teach you how to wrap the PICC when you do take a shower.  Change the PICC dressing as instructed by your health care provider.  Change your PICC dressing if it becomes loose or wet. WHAT SHOULD I KNOW ABOUT PICC CARE?  Check the PICC insertion site   daily for leakage, redness, swelling, or pain.  Do not take a bath, swim, or use hot tubs when you have a PICC. Cover PICC line with clear plastic wrap and tape to keep it dry while showering.  Flush the PICC as directed by your health care provider. Let your health care provider know right away if the PICC is difficult to flush or does not flush. Do not use force to flush the PICC.  Do not use a syringe that is less than 10 mL to flush the PICC.  Never pull or tug on the PICC.  Avoid blood pressure checks on the arm with the PICC.  Keep your PICC identification card with you at all  times.  Do not take the PICC out yourself. Only a trained clinical professional should remove the PICC. SEEK IMMEDIATE MEDICAL CARE IF:  Your PICC is accidentally pulled all the way out. If this happens, cover the insertion site with a bandage or gauze dressing. Do not throw the PICC away. Your health care provider will need to inspect it.  Your PICC was tugged or pulled and has partially come out. Do not  push the PICC back in.  There is any type of drainage, redness, or swelling where the PICC enters the skin.  You cannot flush the PICC, it is difficult to flush, or the PICC leaks around the insertion site when it is flushed.  You hear a "flushing" sound when the PICC is flushed.  You have pain, discomfort, or numbness in your arm, shoulder, or jaw on the same side as the PICC.  You feel your heart "racing" or skipping beats.  You notice a hole or tear in the PICC.  You develop chills or a fever. MAKE SURE YOU:   Understand these instructions.  Will watch your condition.  Will get help right away if you are not doing well or get worse.   This information is not intended to replace advice given to you by your health care provider. Make sure you discuss any questions you have with your health care provider.   Document Released: 03/27/2003 Document Revised: 10/11/2014 Document Reviewed: 05/28/2013 Elsevier Interactive Patient Education 2016 Elsevier Inc.  

## 2016-05-12 ENCOUNTER — Telehealth: Payer: Self-pay | Admitting: *Deleted

## 2016-05-12 ENCOUNTER — Ambulatory Visit: Payer: Medicaid Other

## 2016-05-12 ENCOUNTER — Telehealth: Payer: Self-pay | Admitting: Hematology and Oncology

## 2016-05-12 ENCOUNTER — Other Ambulatory Visit: Payer: Self-pay | Admitting: Hematology and Oncology

## 2016-05-12 ENCOUNTER — Other Ambulatory Visit (HOSPITAL_BASED_OUTPATIENT_CLINIC_OR_DEPARTMENT_OTHER): Payer: Medicaid Other

## 2016-05-12 ENCOUNTER — Encounter: Payer: Self-pay | Admitting: Hematology and Oncology

## 2016-05-12 ENCOUNTER — Ambulatory Visit (HOSPITAL_COMMUNITY)
Admission: RE | Admit: 2016-05-12 | Discharge: 2016-05-12 | Disposition: A | Discharge status: Home / Self Care | Payer: Medicaid Other | Source: Ambulatory Visit | Attending: Hematology and Oncology | Admitting: Hematology and Oncology

## 2016-05-12 ENCOUNTER — Ambulatory Visit (HOSPITAL_BASED_OUTPATIENT_CLINIC_OR_DEPARTMENT_OTHER): Payer: Medicaid Other | Admitting: Hematology and Oncology

## 2016-05-12 ENCOUNTER — Encounter: Payer: Self-pay | Admitting: *Deleted

## 2016-05-12 VITALS — BP 110/63 | HR 56 | Temp 98.5°F | Resp 17 | Wt 98.5 lb

## 2016-05-12 DIAGNOSIS — C099 Malignant neoplasm of tonsil, unspecified: Secondary | ICD-10-CM

## 2016-05-12 DIAGNOSIS — K861 Other chronic pancreatitis: Secondary | ICD-10-CM

## 2016-05-12 DIAGNOSIS — R634 Abnormal weight loss: Secondary | ICD-10-CM | POA: Diagnosis not present

## 2016-05-12 DIAGNOSIS — Z923 Personal history of irradiation: Secondary | ICD-10-CM | POA: Diagnosis not present

## 2016-05-12 DIAGNOSIS — E440 Moderate protein-calorie malnutrition: Secondary | ICD-10-CM | POA: Diagnosis not present

## 2016-05-12 DIAGNOSIS — I878 Other specified disorders of veins: Secondary | ICD-10-CM

## 2016-05-12 DIAGNOSIS — Z85818 Personal history of malignant neoplasm of other sites of lip, oral cavity, and pharynx: Secondary | ICD-10-CM

## 2016-05-12 DIAGNOSIS — Z720 Tobacco use: Secondary | ICD-10-CM | POA: Diagnosis not present

## 2016-05-12 DIAGNOSIS — D539 Nutritional anemia, unspecified: Secondary | ICD-10-CM | POA: Diagnosis not present

## 2016-05-12 DIAGNOSIS — Z95828 Presence of other vascular implants and grafts: Secondary | ICD-10-CM

## 2016-05-12 HISTORY — PX: IR GENERIC HISTORICAL: IMG1180011

## 2016-05-12 LAB — IRON AND TIBC
%SAT: 100 % — AB (ref 20–55)
Iron: 198 ug/dL — ABNORMAL HIGH (ref 42–163)
TIBC: 198 ug/dL — AB (ref 202–409)
UIBC: <1 ug/dL (ref 117–376)

## 2016-05-12 LAB — CBC & DIFF AND RETIC
BASO%: 0.5 % (ref 0.0–2.0)
BASOS ABS: 0 10*3/uL (ref 0.0–0.1)
EOS%: 1.9 % (ref 0.0–7.0)
Eosinophils Absolute: 0.1 10*3/uL (ref 0.0–0.5)
HEMATOCRIT: 23.7 % — AB (ref 38.4–49.9)
HEMOGLOBIN: 7.8 g/dL — AB (ref 13.0–17.1)
IMMATURE RETIC FRACT: 8.3 % (ref 3.00–10.60)
LYMPH#: 0.5 10*3/uL — AB (ref 0.9–3.3)
LYMPH%: 13 % — ABNORMAL LOW (ref 14.0–49.0)
MCH: 36.8 pg — ABNORMAL HIGH (ref 27.2–33.4)
MCHC: 32.9 g/dL (ref 32.0–36.0)
MCV: 111.8 fL — AB (ref 79.3–98.0)
MONO#: 0.7 10*3/uL (ref 0.1–0.9)
MONO%: 16.4 % — ABNORMAL HIGH (ref 0.0–14.0)
NEUT#: 2.8 10*3/uL (ref 1.5–6.5)
NEUT%: 68.2 % (ref 39.0–75.0)
PLATELETS: 244 10*3/uL (ref 140–400)
RBC: 2.12 10*6/uL — AB (ref 4.20–5.82)
RDW: 24 % — AB (ref 11.0–14.6)
Retic %: 0.49 % — ABNORMAL LOW (ref 0.80–1.80)
Retic Ct Abs: 10.39 10*3/uL — ABNORMAL LOW (ref 34.80–93.90)
WBC: 4.1 10*3/uL (ref 4.0–10.3)
nRBC: 0 % (ref 0–0)

## 2016-05-12 LAB — PREPARE RBC (CROSSMATCH)

## 2016-05-12 LAB — FERRITIN: Ferritin: 355 ng/ml — ABNORMAL HIGH (ref 22–316)

## 2016-05-12 MED ORDER — IOPAMIDOL (ISOVUE-300) INJECTION 61%
10.0000 mL | Freq: Once | INTRAVENOUS | Status: AC | PRN
  Administered 2016-05-12: 10 mL via INTRAVENOUS

## 2016-05-12 MED ORDER — HEPARIN SOD (PORK) LOCK FLUSH 100 UNIT/ML IV SOLN
INTRAVENOUS | Status: AC
  Filled 2016-05-12: qty 5

## 2016-05-12 MED ORDER — SODIUM CHLORIDE 0.9% FLUSH
10.0000 mL | INTRAVENOUS | Status: DC | PRN
  Filled 2016-05-12: qty 10

## 2016-05-12 MED ORDER — HEPARIN SOD (PORK) LOCK FLUSH 100 UNIT/ML IV SOLN
500.0000 [IU] | Freq: Once | INTRAVENOUS | Status: DC
Start: 2016-05-12 — End: 2016-05-12
  Filled 2016-05-12: qty 5

## 2016-05-12 MED ORDER — LIDOCAINE HCL 1 % IJ SOLN
INTRAMUSCULAR | Status: DC | PRN
  Administered 2016-05-12: 5 mL

## 2016-05-12 MED ORDER — LIDOCAINE HCL 1 % IJ SOLN
INTRAMUSCULAR | Status: AC
  Filled 2016-05-12: qty 20

## 2016-05-12 NOTE — Assessment & Plan Note (Signed)
He has significant anemia I will order further workup We discussed some of the risks, benefits, and alternatives of blood transfusions. The patient is symptomatic from anemia and the hemoglobin level is critically low.  Some of the side-effects to be expected including risks of transfusion reactions, chills, infection, syndrome of volume overload and risk of hospitalization from various reasons and the patient is willing to proceed and went ahead to sign consent today.

## 2016-05-12 NOTE — Assessment & Plan Note (Signed)
He had recent profound weight loss Ct showed pancreatitis and fluid collection I have consulted dietitian

## 2016-05-12 NOTE — Telephone Encounter (Signed)
Per staff message and POF I have scheduled appts. Advised scheduler of appts. JMW  

## 2016-05-12 NOTE — Assessment & Plan Note (Addendum)
I am very concerned with his recent progressive weight loss. He had been to the ED in May with hemoptysis CT scan showed no signs of local recurrence.  We discussed the importance of smoking cessation

## 2016-05-12 NOTE — Assessment & Plan Note (Signed)
His PICC line is not working. His PICC line will be replaced this afternoon. Due to possible long-term need for transfusions, I will order a port placement next week

## 2016-05-12 NOTE — Telephone Encounter (Signed)
per po to sch pt appt-sen MW email to sch blood-pt to get updated copy of 8/11-gave copy of avs/cal

## 2016-05-12 NOTE — Assessment & Plan Note (Signed)
The most likely cause of his weight loss is due to chronic pancreatitis and malabsorption. CT scan show evidence of fluid collection. I will refer him to GI for evaluation

## 2016-05-12 NOTE — Progress Notes (Signed)
  Oncology Nurse Navigator Documentation  Met with patient during Established Patient appt with Dr. Alvy Bimler.  He completed tmt for tonsil cancer in 2012, currently being treated for chronic pancreatitis. I introduced my self, provided my contact information. He understands I will be supporting him as he continues treatment, encouraged him to contact me with needs/concerns.  Gayleen Orem, RN, BSN, [place] at [facility] [phone]   Navigator Location: [place] (05/12/16 1015) Navigator Encounter Type: Follow-up Appt (05/12/16 1015)           Patient Visit Type: MedOnc (05/12/16 1015) Treatment Phase: Follow-up;Survivorship (05/12/16 1015) Barriers/Navigation Needs: Coordination of Care (05/12/16 1015)                          Time Spent with Patient: 80 (05/12/16 1015)

## 2016-05-12 NOTE — Progress Notes (Signed)
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 CT chest showed marked emphysema with progression of the irregular bandlike and nodular opacities in the posterior lower lobes of the been present since at least 06/04/2014. Abnormal limited abdominal structures     05/07/2016 Imaging    Slight interval progression of right-greater-than-left subpleural consolidative opacities which are nonspecific. These may representchronic scarring, infection or malignancy. Sequelae of chronic pancreatitis.       INTERVAL HISTORY: Please see below for problem oriented charting. He returns for follow-up regarding test results. He continues to have chronic epigastric pain radiating to the back and recent constipation. His oral intake is still poor. The patient denies any recent signs or symptoms of bleeding such as spontaneous epistaxis, hematuria or hematochezia. His venous access is very poor and recent PICC line is not working.  REVIEW OF SYSTEMS:   Constitutional: Denies fevers, chills or abnormal weight loss Eyes: Denies blurriness of vision Ears, nose, mouth, throat, and face: Denies mucositis or sore throat Respiratory: Denies cough, dyspnea or wheezes Cardiovascular: Denies palpitation, chest discomfort or lower extremity swelling Skin: Denies abnormal skin rashes Lymphatics: Denies new lymphadenopathy or easy bruising Neurological:Denies numbness, tingling or new weaknesses Behavioral/Psych: Mood is stable, no new changes  All other systems were  reviewed with the patient and are negative.  I have reviewed the past medical history, past surgical history, social history and family history with the patient and they are unchanged from previous note.  ALLERGIES:  has No Known Allergies.  MEDICATIONS:  Current Outpatient Prescriptions  Medication Sig Dispense Refill  . bisacodyl (DULCOLAX) 5 MG EC tablet Take 5 mg by mouth daily as needed for mild constipation or moderate constipation.    . Cholecalciferol (VITAMIN D PO) Take 1,000 mg by mouth daily.     Marland Kitchen doxylamine, Sleep, (UNISOM) 25 MG tablet Take 75 mg by mouth at bedtime as needed for sleep (sleep).     . folic acid (FOLVITE) 1 MG tablet Take 1 mg by mouth daily.  5  . levothyroxine (SYNTHROID, LEVOTHROID) 75 MCG tablet Take 75 mcg by mouth daily. Reported on 04/16/2016  3  . lipase/protease/amylase (CREON-12/PANCREASE) 12000 UNITS CPEP capsule Take 1 capsule by mouth 4 (four) times daily.    . Omega-3 Fatty Acids (FISH OIL PO) Take 1 capsule by mouth daily.    Marland Kitchen omeprazole (PRILOSEC) 40 MG capsule Take 40 mg by mouth daily.     . ondansetron (ZOFRAN) 4 MG tablet TK 1 T PO BID N  2  . oxyCODONE (ROXICODONE) 15 MG immediate release tablet Take 30 mg by mouth every 6 (six) hours as needed for pain.     Marland Kitchen PROAIR HFA 108 (90 Base) MCG/ACT inhaler INL 2 PUFFS PO QID PRN FOR SOB  5  . promethazine (PHENERGAN) 25 MG tablet Take 25 mg by mouth 2 (two) times daily as needed.  1   No current facility-administered medications for this visit.     PHYSICAL EXAMINATION: ECOG PERFORMANCE STATUS: 2 - Symptomatic, <50% confined to bed  Vitals:  05/12/16 0900  BP: 110/63  Pulse: (!) 56  Resp: 17  Temp: 98.5 F (36.9 C)   Filed Weights   05/12/16 0900  Weight: 98 lb 8 oz (44.7 kg)    GENERAL:alert, no distress and comfortable. He looks thin and cachectic SKIN: skin color, texture, turgor are normal, no rashes or significant lesions EYES: normal, Conjunctiva are pink and non-injected,  sclera clear Musculoskeletal:no cyanosis of digits and no clubbing  NEURO: alert & oriented x 3 with fluent speech, no focal motor/sensory deficits  LABORATORY DATA:  I have reviewed the data as listed    Component Value Date/Time   NA 134 (L) 05/05/2016 1015   K 4.5 05/05/2016 1015   CL 95 (L) 02/17/2016 2033   CL 102 02/27/2013 1045   CO2 30 (H) 05/05/2016 1015   GLUCOSE 97 05/05/2016 1015   GLUCOSE 118 (H) 02/27/2013 1045   BUN 9.8 05/05/2016 1015   CREATININE 0.7 05/05/2016 1015   CALCIUM 9.4 05/05/2016 1015   PROT 7.6 05/05/2016 1015   ALBUMIN 3.9 05/05/2016 1015   AST 39 (H) 05/05/2016 1015   ALT 35 05/05/2016 1015   ALKPHOS 106 05/05/2016 1015   BILITOT 0.51 05/05/2016 1015   GFRNONAA >60 02/17/2016 2033   GFRAA >60 02/17/2016 2033    No results found for: SPEP, UPEP  Lab Results  Component Value Date   WBC 4.1 05/12/2016   NEUTROABS 2.8 05/12/2016   HGB 7.8 (L) 05/12/2016   HCT 23.7 (L) 05/12/2016   MCV 111.8 (H) 05/12/2016   PLT 244 05/12/2016      Chemistry      Component Value Date/Time   NA 134 (L) 05/05/2016 1015   K 4.5 05/05/2016 1015   CL 95 (L) 02/17/2016 2033   CL 102 02/27/2013 1045   CO2 30 (H) 05/05/2016 1015   BUN 9.8 05/05/2016 1015   CREATININE 0.7 05/05/2016 1015      Component Value Date/Time   CALCIUM 9.4 05/05/2016 1015   ALKPHOS 106 05/05/2016 1015   AST 39 (H) 05/05/2016 1015   ALT 35 05/05/2016 1015   BILITOT 0.51 05/05/2016 1015       RADIOGRAPHIC STUDIES:I reviewed the CT I have personally reviewed the radiological images as listed and agreed with the findings in the report.    ASSESSMENT & PLAN:  History of cancer tonsil I am very concerned with his recent progressive weight loss. He had been to the ED in May with hemoptysis CT scan showed no signs of local recurrence.  We discussed the importance of smoking cessation  Chronic pancreatitis ([place]) The most likely cause of his weight loss is due to chronic  pancreatitis and malabsorption. CT scan show evidence of fluid collection. I will refer him to GI for evaluation  Deficiency anemia He has significant anemia I will order further workup We discussed some of the risks, benefits, and alternatives of blood transfusions. The patient is symptomatic from anemia and the hemoglobin level is critically low.  Some of the side-effects to be expected including risks of transfusion reactions, chills, infection, syndrome of volume overload and risk of hospitalization from various reasons and the patient is willing to proceed and went ahead to sign consent today.   Nicotine abuse I spent some time counseling the patient the importance of tobacco cessation. He is encouraged to quit now.   Malnutrition of moderate degree ([place]) He had recent profound weight loss Ct showed pancreatitis and fluid collection I have consulted dietitian  Poor venous access His PICC line is not working. His PICC line will be replaced this afternoon. Due to possible long-term need for transfusions, I will order a port placement next week   Orders Placed This Encounter  Procedures  . IR Fluoro Guide CV Line Right    Indicate type of CVC ordering    Standing Status:   Future    Standing Expiration Date:   07/12/2017    Order Specific Question:   Reason for exam:    Answer:   poor venous access, need port for permanent access    Order Specific Question:   Preferred Imaging Location?    Answer:   [facility]  . Ambulatory referral to Gastroenterology    Referral Priority:   Routine    Referral Type:   Consultation    Referral Reason:   Specialty Services Required    Number of Visits Requested:   1   All questions were answered. The patient knows to call the clinic with any problems, questions or concerns. No barriers to learning was detected. I spent 30 minutes counseling the patient face to face. The total time spent in the appointment was 40 minutes and more  than 50% was on counseling and review of test results     [facility], [place], MD 05/12/2016 10:51 AM

## 2016-05-12 NOTE — Assessment & Plan Note (Signed)
I spent some time counseling the patient the importance of tobacco cessation. He is encouraged to quit now.

## 2016-05-13 ENCOUNTER — Other Ambulatory Visit: Payer: Self-pay | Admitting: *Deleted

## 2016-05-13 ENCOUNTER — Telehealth: Payer: Self-pay | Admitting: *Deleted

## 2016-05-13 ENCOUNTER — Other Ambulatory Visit: Payer: Self-pay | Admitting: Hematology and Oncology

## 2016-05-13 DIAGNOSIS — D531 Other megaloblastic anemias, not elsewhere classified: Secondary | ICD-10-CM

## 2016-05-13 DIAGNOSIS — D61818 Other pancytopenia: Secondary | ICD-10-CM

## 2016-05-13 LAB — HAPTOGLOBIN: Haptoglobin: 143 mg/dL (ref 34–200)

## 2016-05-13 LAB — SEDIMENTATION RATE: SED RATE: 49 mm/h — AB (ref 0–30)

## 2016-05-13 LAB — DIRECT ANTIGLOBULIN TEST (NOT AT ARMC): COOMBS', DIRECT: NEGATIVE

## 2016-05-13 LAB — ERYTHROPOIETIN: Erythropoietin: 1550 m[IU]/mL — ABNORMAL HIGH (ref 2.6–18.5)

## 2016-05-13 NOTE — Telephone Encounter (Signed)
Pt called to check on appts.  Asks if he needs to come in today for PICC line flush?  He had new PICC line placed yesterday.  Informed pt no Flush appt needed today.  He can have PICC flushed and dressing changed when he comes in tomorrow for Transfusion appt..  Pt verbalized understanding.

## 2016-05-14 ENCOUNTER — Other Ambulatory Visit: Payer: Medicaid Other

## 2016-05-14 ENCOUNTER — Ambulatory Visit (HOSPITAL_BASED_OUTPATIENT_CLINIC_OR_DEPARTMENT_OTHER): Payer: Medicaid Other

## 2016-05-14 DIAGNOSIS — D61818 Other pancytopenia: Secondary | ICD-10-CM

## 2016-05-14 DIAGNOSIS — D539 Nutritional anemia, unspecified: Secondary | ICD-10-CM | POA: Diagnosis not present

## 2016-05-14 DIAGNOSIS — D531 Other megaloblastic anemias, not elsewhere classified: Secondary | ICD-10-CM

## 2016-05-14 MED ORDER — ONDANSETRON HCL 8 MG PO TABS
8.0000 mg | ORAL_TABLET | Freq: Once | ORAL | Status: AC
  Administered 2016-05-14: 8 mg via ORAL

## 2016-05-14 MED ORDER — DIPHENHYDRAMINE HCL 25 MG PO CAPS
25.0000 mg | ORAL_CAPSULE | Freq: Once | ORAL | Status: AC
  Administered 2016-05-14: 25 mg via ORAL

## 2016-05-14 MED ORDER — HEPARIN SOD (PORK) LOCK FLUSH 100 UNIT/ML IV SOLN
250.0000 [IU] | INTRAVENOUS | Status: AC | PRN
Start: 2016-05-14 — End: 2016-05-14
  Administered 2016-05-14: 250 [IU]
  Filled 2016-05-14: qty 5

## 2016-05-14 MED ORDER — ACETAMINOPHEN 325 MG PO TABS
650.0000 mg | ORAL_TABLET | Freq: Once | ORAL | Status: AC
  Administered 2016-05-14: 650 mg via ORAL

## 2016-05-14 MED ORDER — ACETAMINOPHEN 325 MG PO TABS
ORAL_TABLET | ORAL | Status: AC
  Filled 2016-05-14: qty 2

## 2016-05-14 MED ORDER — ONDANSETRON HCL 8 MG PO TABS
ORAL_TABLET | ORAL | Status: AC
  Filled 2016-05-14: qty 1

## 2016-05-14 MED ORDER — SODIUM CHLORIDE 0.9% FLUSH
10.0000 mL | INTRAVENOUS | Status: AC | PRN
  Administered 2016-05-14: 10 mL
  Filled 2016-05-14: qty 10

## 2016-05-14 MED ORDER — SODIUM CHLORIDE 0.9 % IV SOLN
250.0000 mL | Freq: Once | INTRAVENOUS | Status: AC
Start: 2016-05-14 — End: 2016-05-14
  Administered 2016-05-14: 250 mL via INTRAVENOUS

## 2016-05-14 MED ORDER — DIPHENHYDRAMINE HCL 25 MG PO CAPS
ORAL_CAPSULE | ORAL | Status: AC
  Filled 2016-05-14: qty 2

## 2016-05-14 NOTE — Progress Notes (Signed)
Patient started complaining of nausea during first unit of blood transfusion.  States his pain in his pancreatic area is returning and he usually takes his own pain medication.  He states he brought his pain medication with him today.  Spoke with Cameo RN to discuss option of him taking his own pain medication Oxycodone IR 15 mg.  Per Dr. Alvy Bimler approved him taking his pain medication and orders placed for 8 mg Zofran PO.

## 2016-05-14 NOTE — Patient Instructions (Signed)
Blood Transfusion  A blood transfusion is a procedure in which you receive donated blood through an IV tube. You may need a blood transfusion because of illness, surgery, or injury. The blood may come from a donor, or it may be your own blood that you donated previously. The blood given in a transfusion is made up of different types of cells. You may receive:  Red blood cells. These carry oxygen and replace lost blood.  Platelets. These control bleeding.  Plasma. Thishelps blood to clot. If you have hemophilia or another clotting disorder, you may also receive other types of blood products. LET [facility] CARE PROVIDER KNOW ABOUT:  Any allergies you have.  All medicines you are taking, including vitamins, herbs, eye drops, creams, and over-the-counter medicines.  Previous problems you or members of your family have had with the use of anesthetics.  Any blood disorders you have.  Previous surgeries you have had.  Any medical conditions you may have.  Any previous reactions you have had during a blood transfusion.  RISKS AND COMPLICATIONS Generally, this is a safe procedure. However, problems may occur, including:  Having an allergic reaction to something in the donated blood.  Fever. This may be a reaction to the white blood cells in the transfused blood.  Iron overload. This can happen from having many transfusions.  Transfusion-related acute lung injury (TRALI). This is a rare reaction that causes lung damage. The cause is not known.TRALI can occur within hours of a transfusion or several days later.  Sudden (acute) or delayed hemolytic reactions. This happens if your blood does not match the cells in your transfusion. Your body's defense system (immune system) may try to attack the new cells. This complication is rare.  Infection. This is rare. BEFORE THE PROCEDURE  You may have a blood test to determine your blood type. This is necessary to know what kind of blood your  body will accept.  If you are going to have a planned surgery, you may donate your own blood. This may be done in case you need to have a transfusion.  If you have had an allergic reaction to a transfusion in the past, you may be given medicine to help prevent a reaction. Take this medicine only as directed by your health care provider.  You will have your temperature, blood pressure, and pulse monitored before the transfusion. PROCEDURE   An IV will be started in your hand or arm.  The bag of donated blood will be attached to your IV tube and given into your vein.  Your temperature, blood pressure, and pulse will be monitored regularly during the transfusion. This monitoring is done to detect early signs of a transfusion reaction.  If you have any signs or symptoms of a reaction, your transfusion will be stopped and you may be given medicine.  When the transfusion is over, your IV will be removed.  Pressure may be applied to the IV site for a few minutes.  A bandage (dressing) will be applied. The procedure may vary among health care providers and hospitals. AFTER THE PROCEDURE  Your blood pressure, temperature, and pulse will be monitored regularly.   This information is not intended to replace advice given to you by your health care provider. Make sure you discuss any questions you have with your health care provider.   Document Released: 09/17/2000 Document Revised: 10/11/2014 Document Reviewed: 07/31/2014 Elsevier Interactive Patient Education 2016 [place] A peripherally  inserted central catheter (PICC) is a long, thin, flexible tube that is inserted into a vein in the upper arm. It is a form of intravenous (IV) access. It is considered to be a "central" line because the tip of the PICC ends in a large vein in your chest. This large vein is called the superior vena cava (SVC). The PICC tip ends in the SVC because there is a lot of blood flow in the  SVC. This allows medicines and IV fluids to be quickly distributed throughout the body. The PICC is inserted using a sterile technique by a specially trained nurse or physician. After the PICC is inserted, a chest X-ray exam is done to be sure it is in the correct place.  A PICC may be placed for different reasons, such as:  To give medicines and liquid nutrition that can only be given through a central line. Examples are:  Certain antibiotic treatments.  Chemotherapy.  Total parenteral nutrition (TPN).  To take frequent blood samples.  To give IV fluids and blood products.  If there is difficulty placing a peripheral intravenous (PIV) catheter. If taken care of properly, a PICC can remain in place for several months. A PICC can also allow a person to go home from the hospital early. Medicine and PICC care can be managed at home by a family member or home health care team. [place] A PICC? Problems with a PICC can occasionally occur. These may include the following:  A blood clot (thrombus) forming in or at the tip of the PICC. This can cause the PICC to become clogged. A clot-dissolving medicine called tissue plasminogen activator (tPA) can be given through the PICC to help break up the clot.  Inflammation of the vein (phlebitis) in which the PICC is placed. Signs of inflammation may include redness, pain at the insertion site, red streaks, or being able to feel a "cord" in the vein where the PICC is located.  Infection in the PICC or at the insertion site. Signs of infection may include fever, chills, redness, swelling, or pus drainage from the PICC insertion site.  PICC movement (malposition). The PICC tip may move from its original position due to excessive physical activity, forceful coughing, sneezing, or vomiting.  A break or cut in the PICC. It is important to not use scissors near the PICC.  Nerve or tendon irritation or injury during PICC  insertion. WHAT SHOULD I KEEP IN MIND ABOUT ACTIVITIES WHEN I HAVE A PICC?  You may bend your arm and move it freely. If your PICC is near or at the bend of your elbow, avoid activity with repeated motion at the elbow.  Rest at home for the remainder of the day following PICC line insertion.  Avoid lifting heavy objects as instructed by your health care provider.  Avoid using a crutch with the arm on the same side as your PICC. You may need to use a walker. WHAT SHOULD I KNOW ABOUT MY PICC DRESSING?  Keep your PICC bandage (dressing) clean and dry to prevent infection.  Ask your health care provider when you may shower. Ask your health care provider to teach you how to wrap the PICC when you do take a shower.  Change the PICC dressing as instructed by your health care provider.  Change your PICC dressing if it becomes loose or wet. WHAT SHOULD I KNOW ABOUT PICC CARE?  Check the PICC insertion site daily for leakage, redness,  swelling, or pain.  Do not take a bath, swim, or use hot tubs when you have a PICC. Cover PICC line with clear plastic wrap and tape to keep it dry while showering.  Flush the PICC as directed by your health care provider. Let your health care provider know right away if the PICC is difficult to flush or does not flush. Do not use force to flush the PICC.  Do not use a syringe that is less than 10 mL to flush the PICC.  Never pull or tug on the PICC.  Avoid blood pressure checks on the arm with the PICC.  Keep your PICC identification card with you at all times.  Do not take the PICC out yourself. Only a trained clinical professional should remove the PICC. SEEK IMMEDIATE MEDICAL CARE IF:  Your PICC is accidentally pulled all the way out. If this happens, cover the insertion site with a bandage or gauze dressing. Do not throw the PICC away. Your health care provider will need to inspect it.  Your PICC was tugged or pulled and has partially come out. Do not   push the PICC back in.  There is any type of drainage, redness, or swelling where the PICC enters the skin.  You cannot flush the PICC, it is difficult to flush, or the PICC leaks around the insertion site when it is flushed.  You hear a "flushing" sound when the PICC is flushed.  You have pain, discomfort, or numbness in your arm, shoulder, or jaw on the same side as the PICC.  You feel your heart "racing" or skipping beats.  You notice a hole or tear in the PICC.  You develop chills or a fever. MAKE SURE YOU:   Understand these instructions.  Will watch your condition.  Will get help right away if you are not doing well or get worse.   This information is not intended to replace advice given to you by your health care provider. Make sure you discuss any questions you have with your health care provider.   Document Released: 03/27/2003 Document Revised: 10/11/2014 Document Reviewed: 05/28/2013 Elsevier Interactive Patient Education [employer].

## 2016-05-15 LAB — CERULOPLASMIN: Ceruloplasmin: 34.9 mg/dL — ABNORMAL HIGH (ref 16.0–31.0)

## 2016-05-15 LAB — FOLATE

## 2016-05-17 ENCOUNTER — Ambulatory Visit (HOSPITAL_BASED_OUTPATIENT_CLINIC_OR_DEPARTMENT_OTHER): Payer: Medicaid Other

## 2016-05-17 DIAGNOSIS — Z85819 Personal history of malignant neoplasm of unspecified site of lip, oral cavity, and pharynx: Secondary | ICD-10-CM | POA: Diagnosis not present

## 2016-05-17 DIAGNOSIS — Z95828 Presence of other vascular implants and grafts: Secondary | ICD-10-CM

## 2016-05-17 DIAGNOSIS — Z85818 Personal history of malignant neoplasm of other sites of lip, oral cavity, and pharynx: Secondary | ICD-10-CM

## 2016-05-17 DIAGNOSIS — Z452 Encounter for adjustment and management of vascular access device: Secondary | ICD-10-CM

## 2016-05-17 LAB — TYPE AND SCREEN
ABO/RH(D): A POS
ANTIBODY SCREEN: NEGATIVE
UNIT DIVISION: 0
UNIT DIVISION: 0

## 2016-05-17 MED ORDER — HEPARIN SOD (PORK) LOCK FLUSH 100 UNIT/ML IV SOLN
500.0000 [IU] | Freq: Once | INTRAVENOUS | Status: AC | PRN
  Administered 2016-05-17: 250 [IU] via INTRAVENOUS
  Filled 2016-05-17: qty 5

## 2016-05-17 MED ORDER — SODIUM CHLORIDE 0.9 % IJ SOLN
10.0000 mL | INTRAMUSCULAR | Status: DC | PRN
  Administered 2016-05-17: 10 mL via INTRAVENOUS
  Filled 2016-05-17: qty 10

## 2016-05-17 NOTE — Patient Instructions (Signed)
PICC Home Guide A peripherally inserted central catheter (PICC) is a long, thin, flexible tube that is inserted into a vein in the upper arm. It is a form of intravenous (IV) access. It is considered to be a "central" line because the tip of the PICC ends in a large vein in your chest. This large vein is called the superior vena cava (SVC). The PICC tip ends in the SVC because there is a lot of blood flow in the SVC. This allows medicines and IV fluids to be quickly distributed throughout the body. The PICC is inserted using a sterile technique by a specially trained nurse or physician. After the PICC is inserted, a chest X-ray exam is done to be sure it is in the correct place.  A PICC may be placed for different reasons, such as:  To give medicines and liquid nutrition that can only be given through a central line. Examples are:  Certain antibiotic treatments.  Chemotherapy.  Total parenteral nutrition (TPN).  To take frequent blood samples.  To give IV fluids and blood products.  If there is difficulty placing a peripheral intravenous (PIV) catheter. If taken care of properly, a PICC can remain in place for several months. A PICC can also allow a person to go home from the hospital early. Medicine and PICC care can be managed at home by a family member or home health care team. WHAT PROBLEMS CAN HAPPEN WHEN I HAVE A PICC? Problems with a PICC can occasionally occur. These may include the following:  A blood clot (thrombus) forming in or at the tip of the PICC. This can cause the PICC to become clogged. A clot-dissolving medicine called tissue plasminogen activator (tPA) can be given through the PICC to help break up the clot.  Inflammation of the vein (phlebitis) in which the PICC is placed. Signs of inflammation may include redness, pain at the insertion site, red streaks, or being able to feel a "cord" in the vein where the PICC is located.  Infection in the PICC or at the insertion  site. Signs of infection may include fever, chills, redness, swelling, or pus drainage from the PICC insertion site.  PICC movement (malposition). The PICC tip may move from its original position due to excessive physical activity, forceful coughing, sneezing, or vomiting.  A break or cut in the PICC. It is important to not use scissors near the PICC.  Nerve or tendon irritation or injury during PICC insertion. WHAT SHOULD I KEEP IN MIND ABOUT ACTIVITIES WHEN I HAVE A PICC?  You may bend your arm and move it freely. If your PICC is near or at the bend of your elbow, avoid activity with repeated motion at the elbow.  Rest at home for the remainder of the day following PICC line insertion.  Avoid lifting heavy objects as instructed by your health care provider.  Avoid using a crutch with the arm on the same side as your PICC. You may need to use a walker. WHAT SHOULD I KNOW ABOUT MY PICC DRESSING?  Keep your PICC bandage (dressing) clean and dry to prevent infection.  Ask your health care provider when you may shower. Ask your health care provider to teach you how to wrap the PICC when you do take a shower.  Change the PICC dressing as instructed by your health care provider.  Change your PICC dressing if it becomes loose or wet. WHAT SHOULD I KNOW ABOUT PICC CARE?  Check the PICC insertion site   daily for leakage, redness, swelling, or pain.  Do not take a bath, swim, or use hot tubs when you have a PICC. Cover PICC line with clear plastic wrap and tape to keep it dry while showering.  Flush the PICC as directed by your health care provider. Let your health care provider know right away if the PICC is difficult to flush or does not flush. Do not use force to flush the PICC.  Do not use a syringe that is less than 10 mL to flush the PICC.  Never pull or tug on the PICC.  Avoid blood pressure checks on the arm with the PICC.  Keep your PICC identification card with you at all  times.  Do not take the PICC out yourself. Only a trained clinical professional should remove the PICC. SEEK IMMEDIATE MEDICAL CARE IF:  Your PICC is accidentally pulled all the way out. If this happens, cover the insertion site with a bandage or gauze dressing. Do not throw the PICC away. Your health care provider will need to inspect it.  Your PICC was tugged or pulled and has partially come out. Do not  push the PICC back in.  There is any type of drainage, redness, or swelling where the PICC enters the skin.  You cannot flush the PICC, it is difficult to flush, or the PICC leaks around the insertion site when it is flushed.  You hear a "flushing" sound when the PICC is flushed.  You have pain, discomfort, or numbness in your arm, shoulder, or jaw on the same side as the PICC.  You feel your heart "racing" or skipping beats.  You notice a hole or tear in the PICC.  You develop chills or a fever. MAKE SURE YOU:   Understand these instructions.  Will watch your condition.  Will get help right away if you are not doing well or get worse.   This information is not intended to replace advice given to you by your health care provider. Make sure you discuss any questions you have with your health care provider.   Document Released: 03/27/2003 Document Revised: 10/11/2014 Document Reviewed: 05/28/2013 Elsevier Interactive Patient Education 2016 Elsevier Inc.  

## 2016-05-18 ENCOUNTER — Telehealth: Payer: Self-pay | Admitting: *Deleted

## 2016-05-18 LAB — VITAMIN B1: THIAMINE: 111.3 nmol/L (ref 66.5–200.0)

## 2016-05-18 NOTE — Telephone Encounter (Signed)
Called patient to let him know Dr Alvy Bimler would like to see him on Wednesday at 1200 with flush afterwards.  Pt states he can come tomorrow. POF sent

## 2016-05-19 ENCOUNTER — Ambulatory Visit (HOSPITAL_BASED_OUTPATIENT_CLINIC_OR_DEPARTMENT_OTHER): Payer: Medicaid Other

## 2016-05-19 ENCOUNTER — Ambulatory Visit (HOSPITAL_BASED_OUTPATIENT_CLINIC_OR_DEPARTMENT_OTHER): Payer: Medicaid Other | Admitting: Hematology and Oncology

## 2016-05-19 ENCOUNTER — Ambulatory Visit (HOSPITAL_COMMUNITY): Payer: Medicaid Other

## 2016-05-19 ENCOUNTER — Encounter: Payer: Self-pay | Admitting: Hematology and Oncology

## 2016-05-19 ENCOUNTER — Other Ambulatory Visit (HOSPITAL_COMMUNITY): Payer: Self-pay

## 2016-05-19 DIAGNOSIS — Z85818 Personal history of malignant neoplasm of other sites of lip, oral cavity, and pharynx: Secondary | ICD-10-CM

## 2016-05-19 DIAGNOSIS — Z85819 Personal history of malignant neoplasm of unspecified site of lip, oral cavity, and pharynx: Secondary | ICD-10-CM

## 2016-05-19 DIAGNOSIS — D61818 Other pancytopenia: Secondary | ICD-10-CM

## 2016-05-19 DIAGNOSIS — Z452 Encounter for adjustment and management of vascular access device: Secondary | ICD-10-CM

## 2016-05-19 DIAGNOSIS — Z95828 Presence of other vascular implants and grafts: Secondary | ICD-10-CM

## 2016-05-19 DIAGNOSIS — D539 Nutritional anemia, unspecified: Secondary | ICD-10-CM

## 2016-05-19 MED ORDER — HEPARIN SOD (PORK) LOCK FLUSH 100 UNIT/ML IV SOLN
500.0000 [IU] | Freq: Once | INTRAVENOUS | Status: AC | PRN
  Administered 2016-05-19: 250 [IU] via INTRAVENOUS
  Filled 2016-05-19: qty 5

## 2016-05-19 MED ORDER — SODIUM CHLORIDE 0.9 % IJ SOLN
10.0000 mL | INTRAMUSCULAR | Status: DC | PRN
  Administered 2016-05-19: 10 mL via INTRAVENOUS
  Filled 2016-05-19: qty 10

## 2016-05-19 NOTE — Patient Instructions (Signed)
PICC Home Guide A peripherally inserted central catheter (PICC) is a long, thin, flexible tube that is inserted into a vein in the upper arm. It is a form of intravenous (IV) access. It is considered to be a "central" line because the tip of the PICC ends in a large vein in your chest. This large vein is called the superior vena cava (SVC). The PICC tip ends in the SVC because there is a lot of blood flow in the SVC. This allows medicines and IV fluids to be quickly distributed throughout the body. The PICC is inserted using a sterile technique by a specially trained nurse or physician. After the PICC is inserted, a chest X-ray exam is done to be sure it is in the correct place.  A PICC may be placed for different reasons, such as:  To give medicines and liquid nutrition that can only be given through a central line. Examples are:  Certain antibiotic treatments.  Chemotherapy.  Total parenteral nutrition (TPN).  To take frequent blood samples.  To give IV fluids and blood products.  If there is difficulty placing a peripheral intravenous (PIV) catheter. If taken care of properly, a PICC can remain in place for several months. A PICC can also allow a person to go home from the hospital early. Medicine and PICC care can be managed at home by a family member or home health care team. WHAT PROBLEMS CAN HAPPEN WHEN I HAVE A PICC? Problems with a PICC can occasionally occur. These may include the following:  A blood clot (thrombus) forming in or at the tip of the PICC. This can cause the PICC to become clogged. A clot-dissolving medicine called tissue plasminogen activator (tPA) can be given through the PICC to help break up the clot.  Inflammation of the vein (phlebitis) in which the PICC is placed. Signs of inflammation may include redness, pain at the insertion site, red streaks, or being able to feel a "cord" in the vein where the PICC is located.  Infection in the PICC or at the insertion  site. Signs of infection may include fever, chills, redness, swelling, or pus drainage from the PICC insertion site.  PICC movement (malposition). The PICC tip may move from its original position due to excessive physical activity, forceful coughing, sneezing, or vomiting.  A break or cut in the PICC. It is important to not use scissors near the PICC.  Nerve or tendon irritation or injury during PICC insertion. WHAT SHOULD I KEEP IN MIND ABOUT ACTIVITIES WHEN I HAVE A PICC?  You may bend your arm and move it freely. If your PICC is near or at the bend of your elbow, avoid activity with repeated motion at the elbow.  Rest at home for the remainder of the day following PICC line insertion.  Avoid lifting heavy objects as instructed by your health care provider.  Avoid using a crutch with the arm on the same side as your PICC. You may need to use a walker. WHAT SHOULD I KNOW ABOUT MY PICC DRESSING?  Keep your PICC bandage (dressing) clean and dry to prevent infection.  Ask your health care provider when you may shower. Ask your health care provider to teach you how to wrap the PICC when you do take a shower.  Change the PICC dressing as instructed by your health care provider.  Change your PICC dressing if it becomes loose or wet. WHAT SHOULD I KNOW ABOUT PICC CARE?  Check the PICC insertion site   daily for leakage, redness, swelling, or pain.  Do not take a bath, swim, or use hot tubs when you have a PICC. Cover PICC line with clear plastic wrap and tape to keep it dry while showering.  Flush the PICC as directed by your health care provider. Let your health care provider know right away if the PICC is difficult to flush or does not flush. Do not use force to flush the PICC.  Do not use a syringe that is less than 10 mL to flush the PICC.  Never pull or tug on the PICC.  Avoid blood pressure checks on the arm with the PICC.  Keep your PICC identification card with you at all  times.  Do not take the PICC out yourself. Only a trained clinical professional should remove the PICC. SEEK IMMEDIATE MEDICAL CARE IF:  Your PICC is accidentally pulled all the way out. If this happens, cover the insertion site with a bandage or gauze dressing. Do not throw the PICC away. Your health care provider will need to inspect it.  Your PICC was tugged or pulled and has partially come out. Do not  push the PICC back in.  There is any type of drainage, redness, or swelling where the PICC enters the skin.  You cannot flush the PICC, it is difficult to flush, or the PICC leaks around the insertion site when it is flushed.  You hear a "flushing" sound when the PICC is flushed.  You have pain, discomfort, or numbness in your arm, shoulder, or jaw on the same side as the PICC.  You feel your heart "racing" or skipping beats.  You notice a hole or tear in the PICC.  You develop chills or a fever. MAKE SURE YOU:   Understand these instructions.  Will watch your condition.  Will get help right away if you are not doing well or get worse.   This information is not intended to replace advice given to you by your health care provider. Make sure you discuss any questions you have with your health care provider.   Document Released: 03/27/2003 Document Revised: 10/11/2014 Document Reviewed: 05/28/2013 Elsevier Interactive Patient Education 2016 Elsevier Inc.  

## 2016-05-19 NOTE — Assessment & Plan Note (Signed)
He has severe pancytopenia of unknown etiology. The very high erythropoietin level suggested possible aplastic anemia. I recommend we proceed with bone marrow aspirate and biopsy for further workup and he agreed to proceed. In the meantime, we will continue blood draw on the weekly basis and transfuse as needed to keep hemoglobin greater than 8 g

## 2016-05-19 NOTE — Progress Notes (Signed)
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 CT chest showed marked emphysema with progression of the irregular bandlike and nodular opacities in the posterior lower lobes of the been present since at least 06/04/2014. Abnormal limited abdominal structures     05/07/2016 Imaging    Slight interval progression of right-greater-than-left subpleural consolidative opacities which are nonspecific. These may representchronic scarring, infection or malignancy. Sequelae of chronic pancreatitis.       INTERVAL HISTORY: Please see below for problem oriented charting. He returns to review test results. He complained of fatigue. He has abdominal distention. His appetite is good. The patient denies any recent signs or symptoms of bleeding such as spontaneous epistaxis, hematuria or hematochezia.  REVIEW OF SYSTEMS:   Constitutional: Denies fevers, chills or abnormal weight loss Eyes: Denies blurriness of vision Ears, nose, mouth, throat, and face: Denies mucositis or sore throat Respiratory: Denies cough, dyspnea or wheezes Cardiovascular: Denies palpitation, chest discomfort or lower extremity swelling Gastrointestinal:  Denies nausea, heartburn or change in bowel habits Skin: Denies abnormal skin rashes Lymphatics: Denies new lymphadenopathy or easy bruising Neurological:Denies numbness, tingling or new weaknesses Behavioral/Psych: Mood is stable, no new changes  All other systems were reviewed with the patient and are negative.  I have reviewed  the past medical history, past surgical history, social history and family history with the patient and they are unchanged from previous note.  ALLERGIES:  has No Known Allergies.  MEDICATIONS:  Current Outpatient Prescriptions  Medication Sig Dispense Refill  . bisacodyl (DULCOLAX) 5 MG EC tablet Take 5 mg by mouth daily as needed for mild constipation or moderate constipation.    . Cholecalciferol (VITAMIN D PO) Take 1,000 mg by mouth daily.     Marland Kitchen doxylamine, Sleep, (UNISOM) 25 MG tablet Take 75 mg by mouth at bedtime as needed for sleep (sleep).     . folic acid (FOLVITE) 1 MG tablet Take 1 mg by mouth daily.  5  . levothyroxine (SYNTHROID, LEVOTHROID) 75 MCG tablet Take 75 mcg by mouth daily. Reported on 04/16/2016  3  . lipase/protease/amylase (CREON-12/PANCREASE) 12000 UNITS CPEP capsule Take 1 capsule by mouth 4 (four) times daily.    . Omega-3 Fatty Acids (FISH OIL PO) Take 1 capsule by mouth daily.    Marland Kitchen omeprazole (PRILOSEC) 40 MG capsule Take 40 mg by mouth daily.     . ondansetron (ZOFRAN) 4 MG tablet TK 1 T PO BID N  2  . oxyCODONE (ROXICODONE) 15 MG immediate release tablet Take 30 mg by mouth every 6 (six) hours as needed for pain.     Marland Kitchen PROAIR HFA 108 (90 Base) MCG/ACT inhaler INL 2 PUFFS PO QID PRN FOR SOB  5  . promethazine (PHENERGAN) 25 MG tablet Take 25 mg by mouth 2 (two) times daily as needed.  1   No current facility-administered medications for this visit.     PHYSICAL EXAMINATION: ECOG PERFORMANCE STATUS: 1 - Symptomatic but completely ambulatory  Vitals:   05/19/16 1232  BP: 113/67  Pulse: 66  Resp: 18  Temp: 98.7 F (37.1 C)   Filed Weights   05/19/16 1232  Weight: 100 lb 9 oz (45.6 kg)    GENERAL:alert, no distress and comfortable. He looks thin and cachectic SKIN: skin color, texture, turgor are normal, no rashes or significant lesions EYES: normal, Conjunctiva are pale and non-injected, sclera clear OROPHARYNX:no exudate, no erythema and lips,  buccal mucosa, and tongue normal  NECK: supple, thyroid normal size, non-tender, without nodularity LYMPH:  no palpable lymphadenopathy in the cervical, axillary or inguinal LUNGS: clear to auscultation and percussion with normal breathing effort HEART: regular rate & rhythm and no murmurs and no lower extremity edema ABDOMEN:abdomen soft, distended with prominent veins Musculoskeletal:no cyanosis of digits and no clubbing  NEURO: alert & oriented x 3 with fluent speech, no focal motor/sensory deficits  LABORATORY DATA:  I have reviewed the data as listed    Component Value Date/Time   NA 134 (L) 05/05/2016 1015   K 4.5 05/05/2016 1015   CL 95 (L) 02/17/2016 2033   CL 102 02/27/2013 1045   CO2 30 (H) 05/05/2016 1015   GLUCOSE 97 05/05/2016 1015   GLUCOSE 118 (H) 02/27/2013 1045   BUN 9.8 05/05/2016 1015   CREATININE 0.7 05/05/2016 1015   CALCIUM 9.4 05/05/2016 1015   PROT 7.6 05/05/2016 1015   ALBUMIN 3.9 05/05/2016 1015   AST 39 (H) 05/05/2016 1015   ALT 35 05/05/2016 1015   ALKPHOS 106 05/05/2016 1015   BILITOT 0.51 05/05/2016 1015   GFRNONAA >60 02/17/2016 2033   GFRAA >60 02/17/2016 2033    No results found for: SPEP, UPEP  Lab Results  Component Value Date   WBC 4.1 05/12/2016   NEUTROABS 2.8 05/12/2016   HGB 7.8 (L) 05/12/2016   HCT 23.7 (L) 05/12/2016   MCV 111.8 (H) 05/12/2016   PLT 244 05/12/2016      Chemistry      Component Value Date/Time   NA 134 (L) 05/05/2016 1015   K 4.5 05/05/2016 1015   CL 95 (L) 02/17/2016 2033   CL 102 02/27/2013 1045   CO2 30 (H) 05/05/2016 1015   BUN 9.8 05/05/2016 1015   CREATININE 0.7 05/05/2016 1015      Component Value Date/Time   CALCIUM 9.4 05/05/2016 1015   ALKPHOS 106 05/05/2016 1015   AST 39 (H) 05/05/2016 1015   ALT 35 05/05/2016 1015   BILITOT 0.51 05/05/2016 1015       ASSESSMENT & PLAN:  Deficiency anemia He has severe pancytopenia of unknown etiology. The very high erythropoietin level suggested  possible aplastic anemia. I recommend we proceed with bone marrow aspirate and biopsy for further workup and he agreed to proceed. In the meantime, we will continue blood draw on the weekly basis and transfuse as needed to keep hemoglobin greater than 8 g   No orders of the defined types were placed in this encounter.  All questions were answered. The patient knows to call the clinic with any problems, questions or concerns. No barriers to learning was detected. I spent 15 minutes counseling the patient face to face. The total time spent in the appointment was 20 minutes and more than 50% was on counseling and review of test results     [facility], [place], MD 05/19/2016 5:56 PM

## 2016-05-20 ENCOUNTER — Encounter (HOSPITAL_COMMUNITY): Payer: Self-pay | Admitting: Emergency Medicine

## 2016-05-20 ENCOUNTER — Telehealth: Payer: Self-pay | Admitting: *Deleted

## 2016-05-20 ENCOUNTER — Other Ambulatory Visit: Payer: Self-pay

## 2016-05-20 ENCOUNTER — Emergency Department (HOSPITAL_COMMUNITY)
Admission: EM | Admit: 2016-05-20 | Discharge: 2016-05-21 | Disposition: A | Discharge status: Home / Self Care | Payer: Medicaid Other | Attending: Emergency Medicine | Admitting: Emergency Medicine

## 2016-05-20 ENCOUNTER — Emergency Department (HOSPITAL_COMMUNITY): Payer: Medicaid Other

## 2016-05-20 DIAGNOSIS — J449 Chronic obstructive pulmonary disease, unspecified: Secondary | ICD-10-CM | POA: Diagnosis not present

## 2016-05-20 DIAGNOSIS — K861 Other chronic pancreatitis: Secondary | ICD-10-CM | POA: Diagnosis not present

## 2016-05-20 DIAGNOSIS — R059 Cough, unspecified: Secondary | ICD-10-CM

## 2016-05-20 DIAGNOSIS — Z79899 Other long term (current) drug therapy: Secondary | ICD-10-CM | POA: Insufficient documentation

## 2016-05-20 DIAGNOSIS — I10 Essential (primary) hypertension: Secondary | ICD-10-CM | POA: Insufficient documentation

## 2016-05-20 DIAGNOSIS — E039 Hypothyroidism, unspecified: Secondary | ICD-10-CM | POA: Diagnosis not present

## 2016-05-20 DIAGNOSIS — R079 Chest pain, unspecified: Secondary | ICD-10-CM

## 2016-05-20 DIAGNOSIS — F1290 Cannabis use, unspecified, uncomplicated: Secondary | ICD-10-CM | POA: Diagnosis not present

## 2016-05-20 DIAGNOSIS — R05 Cough: Secondary | ICD-10-CM

## 2016-05-20 DIAGNOSIS — R1013 Epigastric pain: Secondary | ICD-10-CM | POA: Diagnosis present

## 2016-05-20 DIAGNOSIS — F17210 Nicotine dependence, cigarettes, uncomplicated: Secondary | ICD-10-CM | POA: Insufficient documentation

## 2016-05-20 DIAGNOSIS — Z8589 Personal history of malignant neoplasm of other organs and systems: Secondary | ICD-10-CM | POA: Diagnosis not present

## 2016-05-20 LAB — BASIC METABOLIC PANEL
Anion gap: 7 (ref 5–15)
BUN: 13 mg/dL (ref 6–20)
CALCIUM: 8.8 mg/dL — AB (ref 8.9–10.3)
CO2: 31 mmol/L (ref 22–32)
CREATININE: 0.48 mg/dL — AB (ref 0.61–1.24)
Chloride: 93 mmol/L — ABNORMAL LOW (ref 101–111)
GFR calc Af Amer: >60 mL/min (ref >60)
GLUCOSE: 92 mg/dL (ref 65–99)
Potassium: 3.5 mmol/L (ref 3.5–5.1)
SODIUM: 131 mmol/L — AB (ref 135–145)

## 2016-05-20 LAB — CBC
HCT: 29.8 % — ABNORMAL LOW (ref 39.0–52.0)
Hemoglobin: 10.2 g/dL — ABNORMAL LOW (ref 13.0–17.0)
MCH: 35.2 pg — ABNORMAL HIGH (ref 26.0–34.0)
MCHC: 34.2 g/dL (ref 30.0–36.0)
MCV: 102.8 fL — ABNORMAL HIGH (ref 78.0–100.0)
PLATELETS: 166 10*3/uL (ref 150–400)
RBC: 2.9 MIL/uL — ABNORMAL LOW (ref 4.22–5.81)
RDW: 21.4 % — AB (ref 11.5–15.5)
WBC: 5 10*3/uL (ref 4.0–10.5)

## 2016-05-20 LAB — I-STAT TROPONIN, ED: TROPONIN I, POC: 0.01 ng/mL (ref 0.00–0.08)

## 2016-05-20 NOTE — Telephone Encounter (Signed)
Per staff message from MD I have moved appts and sent message back to MD

## 2016-05-20 NOTE — ED Provider Notes (Signed)
[facility] DEPT Provider Note   CSN: [id number] Arrival date & time: 05/20/16  1937  By signing my name below, I, Dora Sims, attest that this documentation has been prepared under the direction and in the presence of [employer], PA-C. Electronically Signed: Dora Sims, Scribe. 05/20/2016. 11:59 PM.   History   Chief Complaint No chief complaint on file.   The history is provided by the patient. No language interpreter was used.     HPI Comments: Russell Williamson is a 59 y.o. male with PMHx of chronic alcoholic pancreatitis, neck malignant neoplasm, cervical adenopathy, HTN, chronic neck pain, COPD, and MRSA carrier, who presents to the Emergency Department complaining of sudden onset, constant, abdominal pain for the last week. He endorses pain radiation into his left chest and left neck. He states he has regularly experienced abdominal pain in his pancreas for several years; his last episode of similar pain was last month. Pt notes a productive cough with gray/green mucous for several days, nausea, and SOB beginning this afternoon. Pt reports he ran out of his inhaler three days ago. He states he smokes 3-4 cigarettes a day and denies alcohol use. Pt reports he has been eating normally. He notes he no longer has a G-tube. Pt denies vomiting or any other associated symptoms.  Past Medical History:  Diagnosis Date  . Arthritis   . Cervical adenopathy 04/26/2012  . Chronic pancreatitis ([place])   . Constipation   . Daily headache 05/23/2012   "small ones"  . Deficiency anemia 04/15/2016  . G tube feedings ([place])    has a feeding tube in stomach  . History of blood transfusion ~ 2004   "from the pancreatitis"  . History of radiation therapy 02/02/2011-03/22/2011   head/neck,left tonsil  . Hx of radiation therapy 02/02/11 to 03/22/11   L tonsil  . Hypertension   . Hypothyroidism 10/29/2013  . 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 Neck malignant neoplasm ([place]) 05/23/2012  . Pancytopenia  ([place]) 02/28/2014  . Poor venous access 04/28/2016  . 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 Thrush 11/02/2013  . 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 Urinary hesitancy     Patient Active Problem List   Diagnosis Date Noted  . 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 PICC (peripherally inserted central catheter) flush 05/07/2016  . Port catheter in place 05/05/2016  . Poor venous access 04/28/2016  . Weight loss 04/16/2016  . Deficiency anemia 04/15/2016  . Chronic leukopenia 04/19/2015  . Anemia in chronic illness 04/19/2015  . Weight loss, unintentional 04/19/2015  . Relapsing chronic pancreatitis ([place]) 01/17/2015  . Diarrhea 09/04/2014  . 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 Malnutrition of moderate degree ([place]) 09/03/2014  . Hypokalemia 09/02/2014  . Pancreatitis 09/01/2014  . Chronic alcoholic pancreatitis ([place]) 09/01/2014  . Mucositis 06/17/2014  . Preventive measure 06/17/2014  . Chronic neck pain 06/17/2014  . S/P gastrostomy ([place]) 06/17/2014  . Nicotine abuse 02/28/2014  . Pancytopenia ([place]) 02/28/2014  . Hypothyroidism (acquired) 11/02/2013  . Hypothyroidism 10/29/2013  . 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 Cervical adenopathy 04/26/2012  . Hiatal hernia   . Hx of radiation therapy   . HTN (hypertension) 10/16/2011  . History of cancer tonsil 08/20/2011  . Chronic pancreatitis [facility])     Past Surgical History:  Procedure Laterality Date  . BILE DUCT STENT PLACEMENT     hx of  . CHOLECYSTECTOMY  04/2005  . DIRECT LARYNGOSCOPY  05/23/2012   Procedure: DIRECT LARYNGOSCOPY;  Surgeon: Rozetta Nunnery,  MD;  Location: [facility];  Service: ENT;  Laterality: N/A;  . DIRECT LARYNGOSCOPY N/A 03/23/2013   Procedure: DIRECT LARYNGOSCOPY;  Surgeon: Rozetta Nunnery, MD;  Location: [place];  Service: ENT;  Laterality: N/A;  . ESOPHAGEAL DILATION N/A 03/23/2013   Procedure: ESOPHAGEAL DILATION;  Surgeon: Rozetta Nunnery, MD;  Location: [facility];  Service:  ENT;  Laterality: N/A;  . FRACTURE SURGERY    . IR GENERIC HISTORICAL  04/29/2016   IR US GUIDE VASC ACCESS RIGHT 04/29/2016 [facility]INTER[facility]  . IR GENERIC HISTORICAL  04/29/2016   IR FLUORO GUIDE CV LINE RIGHT 04/29/2016 [facility]  . IR GENERIC HISTORICAL  05/12/2016   IR US GUIDE VASC ACCESS LEFT 05/12/2016 [facility]  . IR GENERIC HISTORICAL  05/12/2016   IR FLUORO GUIDE CV LINE LEFT 05/12/2016 [facility]  . MASS BIOPSY  05/23/2012   Procedure: NECK MASS BIOPSY;  Surgeon: Rozetta Nunnery, MD;  Location: [place];  Service: ENT;  Laterality: N/A;  . RADICAL NECK DISSECTION  05/23/2012   w/mass excision  . RADICAL NECK DISSECTION  05/23/2012   Procedure: RADICAL NECK DISSECTION;  Surgeon: Rozetta Nunnery, MD;  Location: [facility];  Service: ENT;  Laterality: Left;  . TIBIA FRACTURE SURGERY  1990's   left     Home Medications    Prior to Admission medications   Medication Sig Start Date End Date Taking? Authorizing Provider  bisacodyl (DULCOLAX) 5 MG EC tablet Take 5 mg by mouth daily as needed for mild constipation or moderate constipation.   Yes Historical Provider, MD  Cholecalciferol (VITAMIN D PO) Take 1,000 mg by mouth daily.    Yes Historical Provider, MD  folic acid (FOLVITE) 1 MG tablet Take 1 mg by mouth daily. 05/14/15  Yes Historical Provider, MD  levothyroxine (SYNTHROID, LEVOTHROID) 75 MCG tablet Take 75 mcg by mouth daily. Reported on 04/16/2016 05/14/15  Yes Historical Provider, MD  lipase/protease/amylase (CREON-12/PANCREASE) 12000 UNITS CPEP capsule Take 1 capsule by mouth 4 (four) times daily.   Yes Historical Provider, MD  Omega-3 Fatty Acids (FISH OIL PO) Take 1 capsule by mouth daily.   Yes Historical Provider, MD  omeprazole (PRILOSEC) 40 MG capsule Take 40 mg by mouth daily.    Yes Historical Provider, MD  ondansetron (ZOFRAN) 4 MG tablet TK 1 T PO BID N 02/11/16  Yes Historical Provider, MD  oxyCODONE (ROXICODONE) 15 MG immediate release tablet Take 30 mg by mouth  every 6 (six) hours as needed for pain.    Yes Historical Provider, MD  PROAIR HFA 108 (90 Base) MCG/ACT inhaler INL 2 PUFFS PO QID PRN FOR SOB 02/02/16  Yes Historical Provider, MD  promethazine (PHENERGAN) 25 MG tablet Take 25 mg by mouth 2 (two) times daily as needed for nausea or vomiting.  03/31/16  Yes Historical Provider, MD  temazepam (RESTORIL) 30 MG capsule TK ONE C PO QHS 04/30/16  Yes Historical Provider, MD  doxylamine, Sleep, (UNISOM) 25 MG tablet Take 75 mg by mouth at bedtime as needed for sleep (sleep).     Historical Provider, MD  ondansetron (ZOFRAN ODT) 8 MG disintegrating tablet 8mg  ODT q4 hours prn nausea 05/21/16   Jarrett Soho Starasia Sinko, PA-C  oxyCODONE (ROXICODONE) 5 MG immediate release tablet Take 1 tablet (5 mg total) by mouth every 6 (six) hours as needed for severe pain. 05/21/16   Jarrett Soho Astella Desir, PA-C    Family History Family History  Problem Relation Age of Onset  .  COPD Mother     Social History Social History  Substance Use Topics  . Smoking status: Current Every Day Smoker    Packs/day: 0.25    Years: 40.00    Types: Cigarettes  . Smokeless tobacco: Never Used     Comment: hx 1/2 -1 PPD  . Alcohol use No     Comment: 05/23/2012 hx alcohol abuse, quit ~ 2000    Allergies   Review of patient's allergies indicates no known allergies.   Review of Systems Review of Systems  Respiratory: Positive for cough and shortness of breath.   Cardiovascular: Positive for chest pain.  Gastrointestinal: Positive for abdominal pain and nausea. Negative for vomiting.  Musculoskeletal: Positive for neck pain.  All other systems reviewed and are negative.   Physical Exam Updated Vital Signs BP 110/63 (BP Location: Right Arm)   Pulse (!) 50   Temp 97.7 F (36.5 C) (Oral)   Resp 13   SpO2 92%   Physical Exam  Constitutional: He appears well-developed. He appears cachectic. No distress.  Awake, alert, nontoxic appearance  HENT:  Head: Normocephalic and  atraumatic.  Mouth/Throat: Oropharynx is clear and moist. No oropharyngeal exudate.  Eyes: Conjunctivae are normal. No scleral icterus.  Neck: Normal range of motion. Neck supple.  Cardiovascular: Normal rate, regular rhythm and intact distal pulses.   Pulmonary/Chest: Effort normal and breath sounds normal. No respiratory distress. He has no wheezes.  Equal chest expansion Congested cough  Abdominal: Soft. Bowel sounds are normal. He exhibits no mass. There is tenderness. There is guarding. There is no rebound.  Epigastric tenderness with guarding, no rebound  Musculoskeletal: Normal range of motion. He exhibits no edema.  Neurological: He is alert.  Speech is clear and goal oriented Moves extremities without ataxia  Skin: Skin is warm and dry. He is not diaphoretic.  Psychiatric: He has a normal mood and affect.  Nursing note and vitals reviewed.   ED Treatments / Results  Labs (all labs ordered are listed, but only abnormal results are displayed) Labs Reviewed  BASIC METABOLIC PANEL - Abnormal; Notable for the following:       Result Value   Sodium 131 (*)    Chloride 93 (*)    Creatinine, Ser 0.48 (*)    Calcium 8.8 (*)    All other components within normal limits  CBC - Abnormal; Notable for the following:    RBC 2.90 (*)    Hemoglobin 10.2 (*)    HCT 29.8 (*)    MCV 102.8 (*)    MCH 35.2 (*)    RDW 21.4 (*)    All other components within normal limits  HEPATIC FUNCTION PANEL - Abnormal; Notable for the following:    AST 46 (*)    All other components within normal limits  LIPASE, BLOOD  I-STAT TROPOININ, ED  I-STAT TROPOININ, ED    EKG  EKG Interpretation  Date/Time:  Thursday May 20 2016 20:19:21 EDT Ventricular Rate:  66 PR Interval:    QRS Duration: 85 QT Interval:  418 QTC Calculation: 438 R Axis:   88 Text Interpretation:  Sinus rhythm Anteroseptal infarct, age indeterminate Lead(s) I were not used for morphology analysis Confirmed by ZAMMIT  MD,  Broadus John [phone]) on 05/21/2016 1:12:13 AM       Radiology Dg Chest 2 View  Result Date: 05/20/2016 CLINICAL DATA:  Patient states SOB, coughing gray mucus, and mid chest pain all the way to his abdomen x 1 day. Hx of  hypertension and current smoker. EXAM: CHEST  2 VIEW COMPARISON:  CT chest 04/23/2016.  Chest 02/17/2016 FINDINGS: Diffuse emphysematous changes in the lungs. Fibrosis demonstrated in the lung bases and left mid lung. Areas of fibrosis are similar to previous studies. No focal airspace disease or consolidation. No blunting of costophrenic angles. No pneumothorax. Normal heart size and pulmonary vascularity. Mediastinal contours appear intact. Old right rib fractures. Left PICC catheter with tip over the left axilla suggesting location in the axillary vein. IMPRESSION: Emphysematous changes and fibrosis in the lungs. No active consolidation. Electronically Signed   By: Lucienne Capers M.D.   On: 05/20/2016 21:28    Procedures Procedures (including critical care time)  DIAGNOSTIC STUDIES: Oxygen Saturation is 96% on RA, adequate by my interpretation.    COORDINATION OF CARE: 11:59 PM Discussed treatment plan with pt at bedside and pt agreed to plan.  Medications Ordered in ED Medications  albuterol (PROVENTIL HFA;VENTOLIN HFA) 108 (90 Base) MCG/ACT inhaler 2 puff (not administered)  sodium chloride 0.9 % bolus 1,000 mL (1,000 mLs Intravenous New Bag/Given 05/21/16 0040)  ondansetron (ZOFRAN) injection 4 mg (4 mg Intravenous Given 05/21/16 0142)  morphine 4 MG/ML injection 4 mg (4 mg Intravenous Given 05/21/16 0142)  temazepam (RESTORIL) capsule 30 mg (30 mg Oral Given 05/21/16 0305)  AEROCHAMBER PLUS FLO-VU MEDIUM MISC 1 each (1 each Other Given 05/21/16 0142)  morphine 4 MG/ML injection 4 mg (4 mg Intravenous Given 05/21/16 0514)  famotidine (PEPCID) IVPB 20 mg premix (0 mg Intravenous Stopped 05/21/16 0647)  morphine 4 MG/ML injection 4 mg (4 mg Intravenous Given 05/21/16 0646)      Initial Impression / Assessment and Plan / ED Course  I have reviewed the triage vital signs and the nursing notes.  Pertinent labs & imaging results that were available during my care of the patient were reviewed by me and considered in my medical decision making (see chart for details).  Clinical Course  Value Comment By Time  Troponin i, poc: 0.01 Repeat trop is negative Abigail Butts, PA-C 08/18 0135  Lipase: 18 Lipase WNL.  Pt with chronic pancretitis Abigail Butts, PA-C 08/18 0136  Sodium: (!) 131 Mild hyponatremia.  Pt is receiving fluid bolus Abigail Butts, PA-C 08/18 0136  Hemoglobin: (!) 10.2 Anemia, above baseline Abigail Butts, PA-C 08/18 0136  DG Chest 2 View No PNA or pulmonary edema.  No fluid overload Abigail Butts, PA-C 08/18 0136  BP: 137/75 Afebrile, no tachycardia, no hypotension Abigail Butts, PA-C 08/18 [id number]   Patient feeling better. Pain is controlled.  He is tolerating by mouth without emesis. Jarrett Soho Odis Wickey, PA-C 08/18 [phone]   Abdomen remained soft.  Minimally tender without rebound or guarding. Abigail Butts, PA-C 08/18 [phone]   Patient with history of chronic pancreatitis and similar symptoms today. Patient did complain of some cough and chest pain. Initial and repeat troponin are negative.  Labs are reassuring.  She is well-appearing tolerating by mouth prior to discharge. Discussed reasons to return to emergency department. Patient is in agreement with the plan.  I personally performed the services described in this documentation, which was scribed in my presence. The recorded information has been reviewed and is accurate.'  The patient was discussed with and seen by Dr. Roderic Palau who agrees with the treatment plan.   Final Clinical Impressions(s) / ED Diagnoses   Final diagnoses:  Chronic pancreatitis, unspecified pancreatitis type ([place])  Chest pain, unspecified chest pain type  Cough    New  Prescriptions New  Prescriptions   ONDANSETRON (ZOFRAN ODT) 8 MG DISINTEGRATING TABLET    8mg  ODT q4 hours prn nausea   OXYCODONE (ROXICODONE) 5 MG IMMEDIATE RELEASE TABLET    Take 1 tablet (5 mg total) by mouth every 6 (six) hours as needed for severe pain.     Jarrett Soho Trampas Stettner, PA-C 05/21/16 [id number]    Milton Ferguson, MD 05/21/16 [phone]

## 2016-05-20 NOTE — ED Triage Notes (Addendum)
Patient presents for pancreatitis flare up x2 days. C/o pain from lower abdominal radiating to neck and SOB. Reports subjective fever (100), nausea, constipation (last normal BM yesterday). A&O x4.

## 2016-05-21 ENCOUNTER — Telehealth: Payer: Self-pay | Admitting: *Deleted

## 2016-05-21 LAB — I-STAT TROPONIN, ED: Troponin i, poc: 0.01 ng/mL (ref 0.00–0.08)

## 2016-05-21 LAB — HEPATIC FUNCTION PANEL
ALT: 36 U/L (ref 17–63)
AST: 46 U/L — ABNORMAL HIGH (ref 15–41)
Albumin: 3.7 g/dL (ref 3.5–5.0)
Alkaline Phosphatase: 75 U/L (ref 38–126)
Bilirubin, Direct: 0.1 mg/dL (ref 0.1–0.5)
Indirect Bilirubin: 0.4 mg/dL (ref 0.3–0.9)
Total Bilirubin: 0.5 mg/dL (ref 0.3–1.2)
Total Protein: 6.9 g/dL (ref 6.5–8.1)

## 2016-05-21 LAB — LIPASE, BLOOD: Lipase: 18 U/L (ref 11–51)

## 2016-05-21 MED ORDER — ONDANSETRON 8 MG PO TBDP: ORAL_TABLET | ORAL | 0 refills | Status: DC

## 2016-05-21 MED ORDER — MORPHINE SULFATE (PF) 4 MG/ML IV SOLN
4.0000 mg | Freq: Once | INTRAVENOUS | Status: AC
Start: 2016-05-21 — End: 2016-05-21
  Administered 2016-05-21: 4 mg via INTRAVENOUS
  Filled 2016-05-21: qty 1

## 2016-05-21 MED ORDER — OXYCODONE HCL 5 MG PO TABS: 5.0000 mg | ORAL_TABLET | Freq: Four times a day (QID) | ORAL | 0 refills | Status: DC | PRN

## 2016-05-21 MED ORDER — ONDANSETRON HCL 4 MG/2ML IJ SOLN
4.0000 mg | Freq: Once | INTRAMUSCULAR | Status: AC
  Administered 2016-05-21: 4 mg via INTRAVENOUS
  Filled 2016-05-21: qty 2

## 2016-05-21 MED ORDER — AEROCHAMBER PLUS FLO-VU MEDIUM MISC
1.0000 | Freq: Once | Status: AC
  Administered 2016-05-21: 1
  Filled 2016-05-21: qty 1

## 2016-05-21 MED ORDER — MORPHINE SULFATE (PF) 4 MG/ML IV SOLN
4.0000 mg | Freq: Once | INTRAVENOUS | Status: AC
  Administered 2016-05-21: 4 mg via INTRAVENOUS
  Filled 2016-05-21: qty 1

## 2016-05-21 MED ORDER — FAMOTIDINE IN NACL 20-0.9 MG/50ML-% IV SOLN
20.0000 mg | Freq: Once | INTRAVENOUS | Status: AC
  Administered 2016-05-21: 20 mg via INTRAVENOUS
  Filled 2016-05-21: qty 50

## 2016-05-21 MED ORDER — SODIUM CHLORIDE 0.9 % IV BOLUS (SEPSIS)
1000.0000 mL | Freq: Once | INTRAVENOUS | Status: AC
  Administered 2016-05-21: 1000 mL via INTRAVENOUS

## 2016-05-21 MED ORDER — ALBUTEROL SULFATE HFA 108 (90 BASE) MCG/ACT IN AERS
2.0000 | INHALATION_SPRAY | RESPIRATORY_TRACT | Status: DC | PRN
  Filled 2016-05-21: qty 6.7

## 2016-05-21 MED ORDER — TEMAZEPAM 15 MG PO CAPS
30.0000 mg | ORAL_CAPSULE | Freq: Once | ORAL | Status: AC
  Administered 2016-05-21: 30 mg via ORAL
  Filled 2016-05-21: qty 2

## 2016-05-21 NOTE — Telephone Encounter (Signed)
Called [facility] for appt for pt..  Dr. Alvy Bimler sent Referral for pt to see GI for chronic Pancreatitis.  Appt on Tues 8/22 at 8:30 am made.  Office at [facility] 3 rd Floor.   Also sent request to scheduling to add Flush appts Mon, Wed, Fridays for next two weeks.  Will give new schedule and GI appt to pt when he comes in for Flush appt this afternoon.

## 2016-05-21 NOTE — Discharge Instructions (Signed)
1. Medications: zofran, usual home medications °2. Treatment: rest, drink plenty of fluids, advance diet slowly °3. Follow Up: Please followup with your primary doctor in 2 days for discussion of your diagnoses and further evaluation after today's visit; if you do not have a primary care doctor use the resource guide provided to find one; Please return to the ER for persistent vomiting, high fevers or worsening symptoms ° °

## 2016-05-21 NOTE — ED Notes (Signed)
Pt provided with bus pass prior to d/c. Pt ambulates out of room w/o difficulty.

## 2016-05-22 ENCOUNTER — Telehealth: Payer: Self-pay | Admitting: Hematology and Oncology

## 2016-05-22 NOTE — Telephone Encounter (Signed)
APPOINTMENTS PER 8/15 LOS ALREADY ON SCHEDULE. ADDED ADDITIONAL FLUSH APPOINTMENTS FOR THE WEEK OF 9/6. PATIENT TO GET NEW SCHEDULE AT 8/23 VISIT.

## 2016-05-24 ENCOUNTER — Ambulatory Visit (HOSPITAL_BASED_OUTPATIENT_CLINIC_OR_DEPARTMENT_OTHER): Payer: Medicaid Other

## 2016-05-24 ENCOUNTER — Encounter: Payer: Self-pay | Admitting: *Deleted

## 2016-05-24 ENCOUNTER — Ambulatory Visit: Payer: Self-pay | Admitting: Hematology and Oncology

## 2016-05-24 ENCOUNTER — Other Ambulatory Visit: Payer: Self-pay

## 2016-05-24 DIAGNOSIS — Z85819 Personal history of malignant neoplasm of unspecified site of lip, oral cavity, and pharynx: Secondary | ICD-10-CM | POA: Diagnosis not present

## 2016-05-24 DIAGNOSIS — D539 Nutritional anemia, unspecified: Secondary | ICD-10-CM | POA: Diagnosis not present

## 2016-05-24 DIAGNOSIS — Z85818 Personal history of malignant neoplasm of other sites of lip, oral cavity, and pharynx: Secondary | ICD-10-CM

## 2016-05-24 DIAGNOSIS — Z452 Encounter for adjustment and management of vascular access device: Secondary | ICD-10-CM | POA: Diagnosis not present

## 2016-05-24 DIAGNOSIS — Z95828 Presence of other vascular implants and grafts: Secondary | ICD-10-CM

## 2016-05-24 MED ORDER — SODIUM CHLORIDE 0.9 % IJ SOLN
10.0000 mL | INTRAMUSCULAR | Status: DC | PRN
  Administered 2016-05-24: 10 mL via INTRAVENOUS
  Filled 2016-05-24: qty 10

## 2016-05-24 MED ORDER — HEPARIN SOD (PORK) LOCK FLUSH 100 UNIT/ML IV SOLN
500.0000 [IU] | Freq: Once | INTRAVENOUS | Status: AC | PRN
  Administered 2016-05-24: 500 [IU] via INTRAVENOUS
  Filled 2016-05-24: qty 5

## 2016-05-25 ENCOUNTER — Encounter (HOSPITAL_COMMUNITY): Payer: Self-pay

## 2016-05-25 ENCOUNTER — Emergency Department (HOSPITAL_COMMUNITY)
Admission: EM | Admit: 2016-05-25 | Discharge: 2016-05-25 | Disposition: A | Discharge status: Home / Self Care | Payer: Medicaid Other | Attending: Emergency Medicine | Admitting: Emergency Medicine

## 2016-05-25 ENCOUNTER — Emergency Department (HOSPITAL_COMMUNITY): Payer: Medicaid Other

## 2016-05-25 ENCOUNTER — Ambulatory Visit: Payer: Self-pay | Admitting: Internal Medicine

## 2016-05-25 ENCOUNTER — Other Ambulatory Visit: Payer: Self-pay

## 2016-05-25 DIAGNOSIS — F17210 Nicotine dependence, cigarettes, uncomplicated: Secondary | ICD-10-CM | POA: Diagnosis not present

## 2016-05-25 DIAGNOSIS — R1084 Generalized abdominal pain: Secondary | ICD-10-CM | POA: Insufficient documentation

## 2016-05-25 DIAGNOSIS — Z79899 Other long term (current) drug therapy: Secondary | ICD-10-CM | POA: Diagnosis not present

## 2016-05-25 DIAGNOSIS — E039 Hypothyroidism, unspecified: Secondary | ICD-10-CM | POA: Insufficient documentation

## 2016-05-25 DIAGNOSIS — R1013 Epigastric pain: Secondary | ICD-10-CM | POA: Diagnosis present

## 2016-05-25 DIAGNOSIS — R079 Chest pain, unspecified: Secondary | ICD-10-CM

## 2016-05-25 DIAGNOSIS — Z8589 Personal history of malignant neoplasm of other organs and systems: Secondary | ICD-10-CM | POA: Diagnosis not present

## 2016-05-25 DIAGNOSIS — R197 Diarrhea, unspecified: Secondary | ICD-10-CM | POA: Insufficient documentation

## 2016-05-25 DIAGNOSIS — R112 Nausea with vomiting, unspecified: Secondary | ICD-10-CM | POA: Diagnosis not present

## 2016-05-25 DIAGNOSIS — I10 Essential (primary) hypertension: Secondary | ICD-10-CM | POA: Diagnosis not present

## 2016-05-25 LAB — CBC WITH DIFFERENTIAL/PLATELET
BASOS ABS: 0 10*3/uL (ref 0.0–0.1)
Basophils Relative: 0 %
EOS ABS: 0 10*3/uL (ref 0.0–0.7)
Eosinophils Relative: 1 %
HCT: 30.1 % — ABNORMAL LOW (ref 39.0–52.0)
HEMOGLOBIN: 10.4 g/dL — AB (ref 13.0–17.0)
LYMPHS PCT: 18 %
Lymphs Abs: 0.8 10*3/uL (ref 0.7–4.0)
MCH: 34.9 pg — ABNORMAL HIGH (ref 26.0–34.0)
MCHC: 34.6 g/dL (ref 30.0–36.0)
MCV: 101 fL — ABNORMAL HIGH (ref 78.0–100.0)
Monocytes Absolute: 0.6 10*3/uL (ref 0.1–1.0)
Monocytes Relative: 13 %
NEUTROS PCT: 68 %
Neutro Abs: 3.2 10*3/uL (ref 1.7–7.7)
PLATELETS: 238 10*3/uL (ref 150–400)
RBC: 2.98 MIL/uL — AB (ref 4.22–5.81)
RDW: 22.1 % — ABNORMAL HIGH (ref 11.5–15.5)
WBC: 4.6 10*3/uL (ref 4.0–10.5)

## 2016-05-25 LAB — COMPREHENSIVE METABOLIC PANEL
ALBUMIN: 4.2 g/dL (ref 3.5–5.0)
ALK PHOS: 87 U/L (ref 38–126)
ALT: 42 U/L (ref 17–63)
AST: 55 U/L — ABNORMAL HIGH (ref 15–41)
Anion gap: 6 (ref 5–15)
BUN: 10 mg/dL (ref 6–20)
CALCIUM: 9.2 mg/dL (ref 8.9–10.3)
CHLORIDE: 93 mmol/L — AB (ref 101–111)
CO2: 29 mmol/L (ref 22–32)
CREATININE: 0.42 mg/dL — AB (ref 0.61–1.24)
GFR calc Af Amer: >60 mL/min (ref >60)
GFR calc non Af Amer: >60 mL/min (ref >60)
GLUCOSE: 100 mg/dL — AB (ref 65–99)
Potassium: 4.1 mmol/L (ref 3.5–5.1)
SODIUM: 128 mmol/L — AB (ref 135–145)
Total Bilirubin: 1 mg/dL (ref 0.3–1.2)
Total Protein: 7.5 g/dL (ref 6.5–8.1)

## 2016-05-25 LAB — LIPASE, BLOOD: Lipase: 19 U/L (ref 11–51)

## 2016-05-25 LAB — I-STAT TROPONIN, ED: Troponin i, poc: 0 ng/mL (ref 0.00–0.08)

## 2016-05-25 MED ORDER — OXYCODONE HCL ER 10 MG PO T12A: 10.0000 mg | EXTENDED_RELEASE_TABLET | Freq: Two times a day (BID) | ORAL | 0 refills | Status: DC

## 2016-05-25 MED ORDER — OXYCODONE HCL 5 MG/5ML PO SOLN
10.0000 mg | Freq: Once | ORAL | Status: AC
  Administered 2016-05-25: 10 mg via ORAL
  Filled 2016-05-25: qty 10

## 2016-05-25 MED ORDER — PROMETHAZINE HCL 25 MG PO TABS: 25.0000 mg | ORAL_TABLET | Freq: Four times a day (QID) | ORAL | 0 refills | Status: DC | PRN

## 2016-05-25 MED ORDER — MORPHINE SULFATE (PF) 4 MG/ML IV SOLN
4.0000 mg | Freq: Once | INTRAVENOUS | Status: AC
  Administered 2016-05-25: 4 mg via INTRAVENOUS
  Filled 2016-05-25: qty 1

## 2016-05-25 MED ORDER — OXYCODONE HCL 5 MG PO TABS: 15.0000 mg | ORAL_TABLET | Freq: Once | ORAL | Status: DC

## 2016-05-25 MED ORDER — SODIUM CHLORIDE 0.9 % IV BOLUS (SEPSIS)
1000.0000 mL | Freq: Once | INTRAVENOUS | Status: AC
  Administered 2016-05-25: 1000 mL via INTRAVENOUS

## 2016-05-25 MED ORDER — ENSURE ENLIVE PO LIQD
1.0000 | Freq: Once | ORAL | Status: AC
  Administered 2016-05-25: 237 mL via ORAL
  Filled 2016-05-25: qty 237

## 2016-05-25 MED ORDER — ONDANSETRON HCL 4 MG/2ML IJ SOLN
4.0000 mg | Freq: Once | INTRAMUSCULAR | Status: AC
  Administered 2016-05-25: 4 mg via INTRAVENOUS
  Filled 2016-05-25: qty 2

## 2016-05-25 NOTE — ED Provider Notes (Signed)
[facility] DEPT Provider Note   CSN: [id number] Arrival date & time: 05/25/16  [id number]     History   Chief Complaint Chief Complaint  Patient presents with  . Abdominal Pain  . Emesis  . Diarrhea  . Headache    HPI Russell Williamson is a 60 y.o. male.  Patient with a history of chronic pancreatitis and tonsillar cancer presents today with abdominal pain.  Pain is diffuse and has been present for the past 2 weeks.  He states that the pain is the same pain that he has had in the past.  He usually takes Oxycodone for the pain, but states that he ran out of the medication.  He thinks that this is why his pain has worsened.  He denies any recent alcohol use.  He is also complaining of chest pain.  This pain has also been present for the past 2 weeks.  Pain does not radiate.  Nothing makes the pain worse.  He reports associated nausea, but denies vomiting.  He also reports an associated productive cough.  He denies fever, chills, SOB, hemoptysis, LE edema, or any other symptoms.        Past Medical History:  Diagnosis Date  . Anemia   . Arthritis   . Cervical adenopathy 04/26/2012  . Chronic pancreatitis ([place])   . Constipation   . Daily headache 05/23/2012   "small ones"  . Deficiency anemia 04/15/2016  . G tube feedings ([place])    has a feeding tube in stomach  . History of blood transfusion ~ 2004   "from the pancreatitis"  . History of radiation therapy 02/02/2011-03/22/2011   head/neck,left tonsil  . Hx of radiation therapy 02/02/11 to 03/22/11   L tonsil  . Hypertension   . Hypothyroidism 10/29/2013  . 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 Pancytopenia ([place]) 02/28/2014  . Poor venous access 04/28/2016  . 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 Thrush 11/02/2013  . 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 Urinary hesitancy     Patient Active Problem List   Diagnosis Date Noted  . 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 PICC (peripherally inserted  central catheter) flush 05/07/2016  . Port catheter in place 05/05/2016  . Poor venous access 04/28/2016  . Weight loss 04/16/2016  . Deficiency anemia 04/15/2016  . Chronic leukopenia 04/19/2015  . Anemia in chronic illness 04/19/2015  . Weight loss, unintentional 04/19/2015  . Relapsing chronic pancreatitis ([place]) 01/17/2015  . Diarrhea 09/04/2014  . 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 Malnutrition of moderate degree ([place]) 09/03/2014  . Hypokalemia 09/02/2014  . Pancreatitis 09/01/2014  . Chronic alcoholic pancreatitis ([place]) 09/01/2014  . Mucositis 06/17/2014  . Preventive measure 06/17/2014  . Chronic neck pain 06/17/2014  . S/P gastrostomy ([place]) 06/17/2014  . Nicotine abuse 02/28/2014  . Pancytopenia ([place]) 02/28/2014  . Hypothyroidism (acquired) 11/02/2013  . Hypothyroidism 10/29/2013  . 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 Cervical adenopathy 04/26/2012  . Hiatal hernia   . Hx of radiation therapy   . HTN (hypertension) 10/16/2011  . History of cancer tonsil 08/20/2011  . Chronic pancreatitis [facility])     Past Surgical History:  Procedure Laterality Date  . BILE DUCT STENT PLACEMENT     hx of  . CHOLECYSTECTOMY  04/2005  . DIRECT LARYNGOSCOPY  05/23/2012   Procedure: DIRECT LARYNGOSCOPY;  Surgeon: Rozetta Nunnery, MD;  Location: [facility];  Service: ENT;  Laterality: N/A;  . DIRECT LARYNGOSCOPY N/A 03/23/2013  Procedure: DIRECT LARYNGOSCOPY;  Surgeon: Rozetta Nunnery, MD;  Location: [facility];  Service: ENT;  Laterality: N/A;  . ESOPHAGEAL DILATION N/A 03/23/2013   Procedure: ESOPHAGEAL DILATION;  Surgeon: Rozetta Nunnery, MD;  Location: [facility];  Service: ENT;  Laterality: N/A;  . FRACTURE SURGERY    . IR GENERIC HISTORICAL  04/29/2016   IR US GUIDE VASC ACCESS RIGHT 04/29/2016 [facility]  . IR GENERIC HISTORICAL  04/29/2016   IR FLUORO GUIDE CV LINE RIGHT 04/29/2016 [facility]  . IR GENERIC HISTORICAL  05/12/2016   IR US GUIDE VASC ACCESS LEFT  05/12/2016 [facility]  . IR GENERIC HISTORICAL  05/12/2016   IR FLUORO GUIDE CV LINE LEFT 05/12/2016 [facility]  . MASS BIOPSY  05/23/2012   Procedure: NECK MASS BIOPSY;  Surgeon: Rozetta Nunnery, MD;  Location: [facility];  Service: ENT;  Laterality: N/A;  . RADICAL NECK DISSECTION  05/23/2012   w/mass excision  . RADICAL NECK DISSECTION  05/23/2012   Procedure: RADICAL NECK DISSECTION;  Surgeon: Rozetta Nunnery, MD;  Location: [facility];  Service: ENT;  Laterality: Left;  . TIBIA FRACTURE SURGERY  1990's   left       Home Medications    Prior to Admission medications   Medication Sig Start Date End Date Taking? Authorizing Provider  bisacodyl (DULCOLAX) 5 MG EC tablet Take 5 mg by mouth daily as needed for mild constipation or moderate constipation.   Yes Historical Provider, MD  Cholecalciferol (VITAMIN D PO) Take 1,000 mg by mouth daily.    Yes Historical Provider, MD  Dextromethorphan-Guaifenesin (TUSSIN DM) 10-100 MG/5ML liquid Take 10 mLs by mouth every 12 (twelve) hours.   Yes Historical Provider, MD  doxylamine, Sleep, (UNISOM) 25 MG tablet Take 75 mg by mouth at bedtime as needed for sleep (sleep).    Yes Historical Provider, MD  folic acid (FOLVITE) 1 MG tablet Take 1 mg by mouth daily. 05/14/15  Yes Historical Provider, MD  lipase/protease/amylase (CREON-12/PANCREASE) 12000 UNITS CPEP capsule Take 1 capsule by mouth 4 (four) times daily.   Yes Historical Provider, MD  Omega-3 Fatty Acids (FISH OIL PO) Take 1 capsule by mouth daily.   Yes Historical Provider, MD  omeprazole (PRILOSEC) 40 MG capsule Take 40 mg by mouth daily.    Yes Historical Provider, MD  ondansetron (ZOFRAN) 4 MG tablet take 4mg  by mouth twice daily 02/11/16  Yes Historical Provider, MD  oxyCODONE (ROXICODONE) 15 MG immediate release tablet Take 30 mg by mouth every 6 (six) hours as needed for pain.    Yes Historical Provider, MD  PROAIR HFA [phone] Base) MCG/ACT inhaler Inhale 2 puff into lungs every 4-6 hours  as needed for shortness of breath/wheezing 02/02/16  Yes Historical Provider, MD  promethazine (PHENERGAN) 25 MG tablet Take 25 mg by mouth 2 (two) times daily as needed for nausea or vomiting.  03/31/16  Yes Historical Provider, MD  temazepam (RESTORIL) 30 MG capsule Take 30mg  by mouth at bedtime 04/30/16  Yes Historical Provider, MD  levothyroxine (SYNTHROID, LEVOTHROID) 75 MCG tablet Take 75 mcg by mouth daily. Reported on 04/16/2016 05/14/15   Historical Provider, MD  ondansetron (ZOFRAN ODT) 8 MG disintegrating tablet 8mg  ODT q4 hours prn nausea Patient not taking: Reported on 05/25/2016 [id number]   Delora Fuel, MD  oxyCODONE (ROXICODONE) 5 MG immediate release tablet Take 1 tablet (5 mg total) by mouth every 6 (six) hours as needed for severe pain. Patient  not taking: Reported on 05/25/2016 [id number]   Delora Fuel, MD    Family History Family History  Problem Relation Age of Onset  . COPD Mother     Social History Social History  Substance Use Topics  . Smoking status: Current Every Day Smoker    Packs/day: 0.25    Years: 40.00    Types: Cigarettes  . Smokeless tobacco: Never Used     Comment: hx 1/2 -1 PPD  . Alcohol use No     Comment: 05/23/2012 hx alcohol abuse, quit ~ 2000     Allergies   Review of patient's allergies indicates no known allergies.   Review of Systems Review of Systems  All other systems reviewed and are negative.    Physical Exam Updated Vital Signs BP 140/83 (BP Location: Right Arm)   Pulse 75   Temp 97.7 F (36.5 C) (Oral)   Resp 16   SpO2 95%   Physical Exam  Constitutional: He appears well-nourished.  Patient very thin and cachectic appearing  HENT:  Head: Normocephalic and atraumatic.  Neck: Normal range of motion. Neck supple.  Cardiovascular: Normal rate, regular rhythm and normal heart sounds.   Pulmonary/Chest: Effort normal and breath sounds normal. He exhibits tenderness.  Abdominal: Soft. Bowel sounds are normal. He exhibits no  distension and no mass. There is generalized tenderness. There is no rebound and no guarding. No hernia.  Musculoskeletal: Normal range of motion.  Neurological: He is alert.  Skin: Skin is warm and dry.  Psychiatric: He has a normal mood and affect.  Nursing note and vitals reviewed.    ED Treatments / Results  Labs (all labs ordered are listed, but only abnormal results are displayed) Labs Reviewed  CBC WITH DIFFERENTIAL/PLATELET - Abnormal; Notable for the following:       Result Value   RBC 2.98 (*)    Hemoglobin 10.4 (*)    HCT 30.1 (*)    MCV 101.0 (*)    MCH 34.9 (*)    RDW 22.1 (*)    All other components within normal limits  COMPREHENSIVE METABOLIC PANEL - Abnormal; Notable for the following:    Sodium 128 (*)    Chloride 93 (*)    Glucose, Bld 100 (*)    Creatinine, Ser 0.42 (*)    AST 55 (*)    All other components within normal limits  LIPASE, BLOOD  I-STAT TROPOININ, ED    EKG  EKG Interpretation  Date/Time:  Tuesday May 25 2016 10:07:09 EDT Ventricular Rate:  55 PR Interval:    QRS Duration: 77 QT Interval:  471 QTC Calculation: 451 R Axis:   88 Text Interpretation:  Sinus bradycardia Nonspecific T wave abnormality Confirmed by Ashok Cordia  MD, Lennette Bihari ([id number]) on 05/27/2016 9:51:48 AM       Radiology Dg Chest 2 View  Result Date: 05/25/2016 CLINICAL DATA:  Worsening mid epigastric pain, nausea, vomiting over the last several days. EXAM: CHEST  2 VIEW COMPARISON:  05/20/2016 FINDINGS: There is hyperinflation of the lungs compatible with COPD. Scarring in the left lower lobe, stable. No acute airspace opacities or effusions. Heart is normal size. No acute bony abnormality. Left PICC line remains in place with the tip in the left subclavian vein, unchanged. IMPRESSION: COPD/chronic changes.  No active disease. Electronically Signed   By: Rolm Baptise M.D.   On: 05/25/2016 10:47    Procedures Procedures (including critical care time)  Medications Ordered  in ED Medications  sodium chloride 0.9 %  bolus 1,000 mL (1,000 mLs Intravenous New Bag/Given 05/25/16 1000)  morphine 4 MG/ML injection 4 mg (4 mg Intravenous Given 05/25/16 1002)  ondansetron (ZOFRAN) injection 4 mg (4 mg Intravenous Given 05/25/16 1002)     Initial Impression / Assessment and Plan / ED Course  I have reviewed the triage vital signs and the nursing notes.  Pertinent labs & imaging results that were available during my care of the patient were reviewed by me and considered in my medical decision making (see chart for details).  Clinical Course    12:42 PM Reassessed patient.  Pain has improved.  Tolerating PO liquids.  He states that today is [birth year] birthday and wishes to be discharged home.  Final Clinical Impressions(s) / ED Diagnoses   Final diagnoses:  Chest pain   Patient presents today with chest pain and abdominal pain that has been present for the past 2 weeks.  He states that the pain is the same pain that he has had in the past, but that he ran out of his pain medication.  No ischemic changes on EKG.  Troponin negative.  CXR negative.  No hypoxia, tachycardia, or SOB that would suggest PE.  CP is not pleuritic and has been present for 2 weeks.  Labs unremarkable.  Pain improved in the ED.  Patient tolerating PO liquids prior to discharge.  Stable for discharge.  Return precautions given.   New Prescriptions New Prescriptions   No medications on file     Hyman Bible, PA-C 05/29/16 [place], MD 05/29/16 [phone]

## 2016-05-25 NOTE — ED Triage Notes (Signed)
No response when called from lobby. 

## 2016-05-25 NOTE — ED Triage Notes (Signed)
Pt c/o epigastric pain and n/v/d x 2 weeks and headache and neck pain x 1 day.  Pain score 9/10.  Pt was seen at [facility] last week for same.  Sts he is "out of pain medicine."  Denies injury to neck.  Sts pain w/ "turning it a certain way."

## 2016-05-26 ENCOUNTER — Ambulatory Visit: Payer: Self-pay

## 2016-05-26 ENCOUNTER — Other Ambulatory Visit: Payer: Self-pay

## 2016-05-26 ENCOUNTER — Telehealth: Payer: Self-pay | Admitting: Hematology and Oncology

## 2016-05-26 NOTE — Telephone Encounter (Signed)
Spoke with pt to confirm upcoming appts per LOS

## 2016-05-31 ENCOUNTER — Ambulatory Visit (HOSPITAL_BASED_OUTPATIENT_CLINIC_OR_DEPARTMENT_OTHER): Payer: Medicaid Other

## 2016-05-31 VITALS — BP 116/73 | HR 67 | Temp 99.3°F | Resp 16

## 2016-05-31 DIAGNOSIS — Z85819 Personal history of malignant neoplasm of unspecified site of lip, oral cavity, and pharynx: Secondary | ICD-10-CM

## 2016-05-31 DIAGNOSIS — Z85818 Personal history of malignant neoplasm of other sites of lip, oral cavity, and pharynx: Secondary | ICD-10-CM

## 2016-05-31 DIAGNOSIS — Z452 Encounter for adjustment and management of vascular access device: Secondary | ICD-10-CM

## 2016-05-31 MED ORDER — SODIUM CHLORIDE 0.9% FLUSH
10.0000 mL | INTRAVENOUS | Status: DC | PRN
  Administered 2016-05-31: 10 mL via INTRAVENOUS
  Filled 2016-05-31: qty 10

## 2016-05-31 MED ORDER — HEPARIN SOD (PORK) LOCK FLUSH 100 UNIT/ML IV SOLN
500.0000 [IU] | Freq: Once | INTRAVENOUS | Status: AC
  Administered 2016-05-31: 250 [IU] via INTRAVENOUS
  Filled 2016-05-31: qty 5

## 2016-06-01 ENCOUNTER — Telehealth: Payer: Self-pay | Admitting: *Deleted

## 2016-06-01 NOTE — Telephone Encounter (Signed)
Patient called stating that he would like to cancel his appointment for tomorrow due transportation issues.

## 2016-06-02 ENCOUNTER — Ambulatory Visit: Payer: Self-pay

## 2016-06-02 ENCOUNTER — Telehealth: Payer: Self-pay | Admitting: *Deleted

## 2016-06-02 ENCOUNTER — Other Ambulatory Visit: Payer: Self-pay

## 2016-06-02 NOTE — Telephone Encounter (Signed)
Liliane Channel will try to navigate this patient

## 2016-06-02 NOTE — Telephone Encounter (Signed)
"  I cannot come in today.  I will have transportation tomorrow from 9 - 10:00 am." Today's appointments were cancelled yesterday.  Will notify provider to call patient at [phone] if today's plans need to be tomorrow.  Flush appointments and times provided.

## 2016-06-03 ENCOUNTER — Other Ambulatory Visit: Payer: Self-pay | Admitting: Hematology and Oncology

## 2016-06-03 ENCOUNTER — Telehealth: Payer: Self-pay | Admitting: *Deleted

## 2016-06-03 DIAGNOSIS — I878 Other specified disorders of veins: Secondary | ICD-10-CM

## 2016-06-03 DIAGNOSIS — D539 Nutritional anemia, unspecified: Secondary | ICD-10-CM

## 2016-06-03 NOTE — Telephone Encounter (Signed)
  Oncology Nurse Navigator Documentation  Called Mr. Russell Williamson to inform him of 9:00 lab/flush appt tomorrow morning and potential 10:00 blood transfusion at [facility].  He voiced understanding, stated he would "be there". I talked with him about the importance of scheduling a bone marrow biopsy.  He replied "I can't gain weight if I have to keep coming over there."  He agree to talk with me further tomorrow.  Gayleen Orem, RN, BSN, [place] at [facility] [phone]   Navigator Location: [place] (06/03/16 1449) Navigator Encounter Type: Telephone (06/03/16 1449) Telephone: [name] Confirmation/Clarification (06/03/16 1449)                                        Time Spent with Patient: 15 (06/03/16 1449)

## 2016-06-03 NOTE — Telephone Encounter (Signed)
[place],   We are overbooked tomorrow. If his hemoglobin is less than 8 g, he needs 1 unit of blood. He would need to go to sickle cell tomorrow for blood and his labs/flush appt moved to the morning  Rick, I placed order for CT bone marrow biopsy and port placement for next week. That needs to be arranged. I'm not sure if they can be done on the same day.  Cameo, he needs flush 3 times a week. Maybe advanced home health can be consulted to flush his PICC line at home.  Please let me know when bone marrow biopsy can be done so I can arrange follow-up

## 2016-06-03 NOTE — Telephone Encounter (Signed)
  Oncology Nurse Navigator Documentation  Spoke with Russell Williamson regarding tomorrow's 3:00 flush appt. I explained the importance of flushing his PICC to avoid clotting, he voiced understanding. I indicated that Dr. Alvy Bimler wants to schedule labs with tomorrow's flush, that he may need a blood transfusion based on results.  He voiced understanding. He stated his nephew can bring him tomorrow.  He further acknowledged he is working on a SCAT arrangements for future appts. He understands I will contact him about his lab appointment so he can arrange appropriate arrival. Dr. Alvy Bimler and RN Cameo notified.  Gayleen Orem, RN, BSN, [place] at [facility] [phone]   Navigator Location: [facility] (06/03/16 0952) Navigator Encounter Type: Telephone (06/03/16 [id number]) Telephone: [name] Confirmation/Clarification (06/03/16 [id number])                                        Time Spent with Patient: 15 (06/03/16 [id number])

## 2016-06-03 NOTE — Telephone Encounter (Signed)
I called [facility] to get pt scheduled for blood tomorrow. They scheduled him for 10 am at [facility] so he will need to be here at 9 am for lab/flush appt..  Notified Gayleen Orem, Navigator. He will call pt with schedule for tomorrow.   Also received a call from [facility] in Radiology Scheduling.  She called pt to arrange CT Bx.   Pt said he can only come on Mondays and Tuesdays.  The first available CT BMBx on these days would be 9/19.   After she spoke with pt a little while he told her he didn't want the biopsy.  He apparently told her he didn't want anyone "digging around in my back."    Vivien Rota did not schedule Bx yet but if pt changes his mind then the first available on a Monday or Tuesday is 9/19 at this time.   Notified Liliane Channel and he will also talk to pt about biopsy when he calls him about tomorrow's appointments.

## 2016-06-04 ENCOUNTER — Ambulatory Visit (HOSPITAL_BASED_OUTPATIENT_CLINIC_OR_DEPARTMENT_OTHER): Payer: Medicaid Other

## 2016-06-04 ENCOUNTER — Encounter: Payer: Self-pay | Admitting: *Deleted

## 2016-06-04 ENCOUNTER — Ambulatory Visit (HOSPITAL_COMMUNITY): Admission: RE | Admit: 2016-06-04 | Payer: Medicaid Other | Source: Ambulatory Visit

## 2016-06-04 DIAGNOSIS — Z85819 Personal history of malignant neoplasm of unspecified site of lip, oral cavity, and pharynx: Secondary | ICD-10-CM

## 2016-06-04 DIAGNOSIS — Z85818 Personal history of malignant neoplasm of other sites of lip, oral cavity, and pharynx: Secondary | ICD-10-CM

## 2016-06-04 DIAGNOSIS — D638 Anemia in other chronic diseases classified elsewhere: Secondary | ICD-10-CM

## 2016-06-04 DIAGNOSIS — C099 Malignant neoplasm of tonsil, unspecified: Secondary | ICD-10-CM

## 2016-06-04 DIAGNOSIS — Z95828 Presence of other vascular implants and grafts: Secondary | ICD-10-CM

## 2016-06-04 DIAGNOSIS — Z452 Encounter for adjustment and management of vascular access device: Secondary | ICD-10-CM

## 2016-06-04 LAB — CBC & DIFF AND RETIC
BASO%: 0.4 % (ref 0.0–2.0)
Basophils Absolute: 0 10*3/uL (ref 0.0–0.1)
EOS%: 1.6 % (ref 0.0–7.0)
Eosinophils Absolute: 0.1 10*3/uL (ref 0.0–0.5)
HEMATOCRIT: 28 % — AB (ref 38.4–49.9)
HGB: 9.4 g/dL — ABNORMAL LOW (ref 13.0–17.1)
Immature Retic Fract: 3.5 % (ref 3.00–10.60)
LYMPH%: 12.8 % — ABNORMAL LOW (ref 14.0–49.0)
MCH: 35.6 pg — AB (ref 27.2–33.4)
MCHC: 33.6 g/dL (ref 32.0–36.0)
MCV: 106.1 fL — ABNORMAL HIGH (ref 79.3–98.0)
MONO#: 0.7 10*3/uL (ref 0.1–0.9)
MONO%: 13.4 % (ref 0.0–14.0)
NEUT%: 71.8 % (ref 39.0–75.0)
NEUTROS ABS: 3.7 10*3/uL (ref 1.5–6.5)
Platelets: 128 10*3/uL — ABNORMAL LOW (ref 140–400)
RBC: 2.64 10*6/uL — ABNORMAL LOW (ref 4.20–5.82)
RDW: 23.3 % — AB (ref 11.0–14.6)
RETIC %: 1.19 % (ref 0.80–1.80)
Retic Ct Abs: 31.42 10*3/uL — ABNORMAL LOW (ref 34.80–93.90)
WBC: 5.1 10*3/uL (ref 4.0–10.3)
lymph#: 0.7 10*3/uL — ABNORMAL LOW (ref 0.9–3.3)

## 2016-06-04 MED ORDER — SODIUM CHLORIDE 0.9 % IJ SOLN
10.0000 mL | INTRAMUSCULAR | Status: DC | PRN
  Administered 2016-06-04: 10 mL via INTRAVENOUS
  Filled 2016-06-04: qty 10

## 2016-06-04 NOTE — Progress Notes (Signed)
  Oncology Nurse Navigator Documentation  Met with Mr. Cupps when he arrived for lab/flush appointment. Blood transfusion cancelled b/c Hbg >8.0 (9.4). Transportation is an issue for him.   His nephew is usually available on Mondays and Tuesdays.  Otherwise, he rides [employer]; he noted is dropped off at the [employer] bus stop.  I initiated completion of SCAT application to be completed next week with LCSW Laurin Mullis.  I provided him 3 1-trip bus passes obtained from [employer]. We discussed the importance of obtaining bone biopsy as soon as feasible.  He voiced agreement to proceed. He voiced understanding:  If bx conducted at [facility] it will involve sedation and he will need someone to drive him home.  If conducted by Dr. Alvy Bimler at [facility], sedation not needed.  He understands I will discuss with Dr. Alvy Bimler next Tuesday, contact him with guidance. I provided him an Epic calendar for upcoming appts, emphasized importance of flush appts on 9/6 and 9/8.  He voiced understanding. I provided again my contact information, encouraged him to call with needs/concerns.  He voiced understanding. PN routed to Dr. Alvy Bimler and Polo Riley.  Gayleen Orem, RN, BSN, [place] at [facility] [phone]  Navigator Location: [place] (06/04/16 6010) Navigator Encounter Type: Other (06/04/16 0935)               Barriers/Navigation Needs: Transportation (06/04/16 0935)   Interventions: Transportations (06/04/16 0935)                  Acuity Level 3: Transportation issues;Ongoing guidance and education provided throughout treatment (06/04/16 0935)   Time Spent with Patient: > 120 (06/04/16 0935)

## 2016-06-08 ENCOUNTER — Telehealth: Payer: Self-pay | Admitting: *Deleted

## 2016-06-08 NOTE — Progress Notes (Signed)
Tammi, I might be able to do BM biopsy on 9/15. Please call pathology if this is available. Then we can arrange for him to be seen, labs, etc,

## 2016-06-08 NOTE — Telephone Encounter (Signed)
  Oncology Nurse Navigator Documentation  Called Mr. Tweedy to inform Dr. Alvy Bimler has scheduled his biopsy at [facility] on 9/15 at 0800.  He acknowledged. I reviewed with him this week's appts.  He confirmed understanding.  Gayleen Orem, RN, BSN, [place] at [facility] [phone]   Navigator Location: [facility] (06/08/16 1805) Navigator Encounter Type: Telephone (06/08/16 1805) Telephone: [name] Confirmation/Clarification (06/08/16 1805)             Barriers/Navigation Needs: Coordination of Care (06/08/16 1805)                          Time Spent with Patient: 15 (06/08/16 1805)

## 2016-06-09 ENCOUNTER — Other Ambulatory Visit: Payer: Self-pay | Admitting: Radiology

## 2016-06-09 ENCOUNTER — Ambulatory Visit (HOSPITAL_COMMUNITY)
Admission: RE | Admit: 2016-06-09 | Discharge: 2016-06-09 | Disposition: A | Discharge status: Home / Self Care | Payer: Medicaid Other | Source: Ambulatory Visit | Attending: Hematology and Oncology | Admitting: Hematology and Oncology

## 2016-06-09 ENCOUNTER — Ambulatory Visit (HOSPITAL_BASED_OUTPATIENT_CLINIC_OR_DEPARTMENT_OTHER): Payer: Medicaid Other

## 2016-06-09 DIAGNOSIS — Z85819 Personal history of malignant neoplasm of unspecified site of lip, oral cavity, and pharynx: Secondary | ICD-10-CM | POA: Diagnosis not present

## 2016-06-09 DIAGNOSIS — Z452 Encounter for adjustment and management of vascular access device: Secondary | ICD-10-CM

## 2016-06-09 DIAGNOSIS — Z85818 Personal history of malignant neoplasm of other sites of lip, oral cavity, and pharynx: Secondary | ICD-10-CM

## 2016-06-09 DIAGNOSIS — Z539 Procedure and treatment not carried out, unspecified reason: Secondary | ICD-10-CM | POA: Diagnosis present

## 2016-06-09 DIAGNOSIS — Z95828 Presence of other vascular implants and grafts: Secondary | ICD-10-CM

## 2016-06-09 DIAGNOSIS — I878 Other specified disorders of veins: Secondary | ICD-10-CM

## 2016-06-09 DIAGNOSIS — D539 Nutritional anemia, unspecified: Secondary | ICD-10-CM

## 2016-06-09 MED ORDER — HEPARIN SOD (PORK) LOCK FLUSH 100 UNIT/ML IV SOLN
500.0000 [IU] | Freq: Once | INTRAVENOUS | Status: AC | PRN
  Administered 2016-06-09: 500 [IU] via INTRAVENOUS
  Filled 2016-06-09: qty 5

## 2016-06-09 MED ORDER — DEXTROSE 5 % IV SOLN: 2.0000 g | INTRAVENOUS | Status: AC

## 2016-06-09 MED ORDER — SODIUM CHLORIDE 0.9 % IJ SOLN
10.0000 mL | INTRAMUSCULAR | Status: DC | PRN
  Administered 2016-06-09: 10 mL via INTRAVENOUS
  Filled 2016-06-09: qty 10

## 2016-06-10 ENCOUNTER — Other Ambulatory Visit: Payer: Self-pay | Admitting: General Surgery

## 2016-06-10 ENCOUNTER — Other Ambulatory Visit: Payer: Self-pay | Admitting: Radiology

## 2016-06-10 ENCOUNTER — Telehealth: Payer: Self-pay | Admitting: *Deleted

## 2016-06-10 NOTE — Telephone Encounter (Signed)
Per staff message I have scheduled appts. Sent MD and desk RN message

## 2016-06-11 ENCOUNTER — Encounter (HOSPITAL_COMMUNITY): Payer: Self-pay

## 2016-06-11 ENCOUNTER — Ambulatory Visit (HOSPITAL_BASED_OUTPATIENT_CLINIC_OR_DEPARTMENT_OTHER): Payer: Medicaid Other

## 2016-06-11 ENCOUNTER — Ambulatory Visit (HOSPITAL_COMMUNITY)
Admission: RE | Admit: 2016-06-11 | Discharge: 2016-06-11 | Disposition: A | Discharge status: Home / Self Care | Payer: Medicaid Other | Source: Ambulatory Visit | Attending: Hematology and Oncology | Admitting: Hematology and Oncology

## 2016-06-11 ENCOUNTER — Telehealth: Payer: Self-pay | Admitting: *Deleted

## 2016-06-11 DIAGNOSIS — Z452 Encounter for adjustment and management of vascular access device: Secondary | ICD-10-CM

## 2016-06-11 DIAGNOSIS — Z85818 Personal history of malignant neoplasm of other sites of lip, oral cavity, and pharynx: Secondary | ICD-10-CM

## 2016-06-11 DIAGNOSIS — Z85819 Personal history of malignant neoplasm of unspecified site of lip, oral cavity, and pharynx: Secondary | ICD-10-CM

## 2016-06-11 DIAGNOSIS — Z95828 Presence of other vascular implants and grafts: Secondary | ICD-10-CM

## 2016-06-11 MED ORDER — HEPARIN SOD (PORK) LOCK FLUSH 100 UNIT/ML IV SOLN
500.0000 [IU] | Freq: Once | INTRAVENOUS | Status: AC | PRN
  Administered 2016-06-11: 250 [IU] via INTRAVENOUS
  Filled 2016-06-11: qty 5

## 2016-06-11 MED ORDER — SODIUM CHLORIDE 0.9 % IJ SOLN
10.0000 mL | INTRAMUSCULAR | Status: DC | PRN
  Administered 2016-06-11: 10 mL via INTRAVENOUS
  Filled 2016-06-11: qty 10

## 2016-06-11 NOTE — Telephone Encounter (Signed)
Received call from pt stating that he is running late b/c he is at the bus stop waiting on the bus. He has an appt at 12:30 pm for flush.  Notified both flush nurses.

## 2016-06-11 NOTE — Progress Notes (Signed)
Pt. Stated " I rode the bus today", my nephew goes to work at 3 pm. [employer] called per [employer], stated " morning appointments  works better for me, because I go to work at 3 pm.  IR notified of pt. Riding bus today, Procedure cancelled for today. IR to call pt. At home to reschedule.

## 2016-06-11 NOTE — Telephone Encounter (Signed)
Oncology Nurse Navigator Documentation  Called Mr. Disanti regarding today's cancelled [facility] placement. He stated his ride home fell through.  He understands the procedure will be rescheduled. I informed him of 3:00 Flush appointments next 01/13/2023 and Wednesday.  He understands I will provide him additional bus passes on 2023/01/13 when he arrives.  Gayleen Orem, RN, BSN, [place] at [place] [phone]

## 2016-06-11 NOTE — Patient Instructions (Signed)

## 2016-06-14 ENCOUNTER — Encounter: Payer: Self-pay | Admitting: *Deleted

## 2016-06-14 ENCOUNTER — Ambulatory Visit (HOSPITAL_BASED_OUTPATIENT_CLINIC_OR_DEPARTMENT_OTHER): Payer: Medicaid Other

## 2016-06-14 DIAGNOSIS — Z85819 Personal history of malignant neoplasm of unspecified site of lip, oral cavity, and pharynx: Secondary | ICD-10-CM

## 2016-06-14 DIAGNOSIS — Z95828 Presence of other vascular implants and grafts: Secondary | ICD-10-CM

## 2016-06-14 DIAGNOSIS — Z452 Encounter for adjustment and management of vascular access device: Secondary | ICD-10-CM

## 2016-06-14 DIAGNOSIS — Z85818 Personal history of malignant neoplasm of other sites of lip, oral cavity, and pharynx: Secondary | ICD-10-CM

## 2016-06-14 MED ORDER — HEPARIN SOD (PORK) LOCK FLUSH 100 UNIT/ML IV SOLN
500.0000 [IU] | Freq: Once | INTRAVENOUS | Status: AC | PRN
  Administered 2016-06-14: 250 [IU] via INTRAVENOUS
  Filled 2016-06-14: qty 5

## 2016-06-14 MED ORDER — SODIUM CHLORIDE 0.9 % IJ SOLN
10.0000 mL | INTRAMUSCULAR | Status: DC | PRN
  Administered 2016-06-14: 10 mL via INTRAVENOUS
  Filled 2016-06-14: qty 10

## 2016-06-14 NOTE — Progress Notes (Signed)
Oncology Nurse Navigator Documentation  Met with Russell Williamson following scheduled Flush appt. Provided him 11-ride bus pass received from [employer] this morning. I encouraged him to use pass for [facility] appointments only. Provided him Epic appointment calendar, reviewed Wednesday's Flush appt, Friday's biopsy with Dr. Alvy Bimler at [facility]. He voiced understanding of information provided.  Gayleen Orem, RN, BSN, [place] at [place] [phone]

## 2016-06-14 NOTE — Patient Instructions (Signed)
PICC Home Guide A peripherally inserted central catheter (PICC) is a long, thin, flexible tube that is inserted into a vein in the upper arm. It is a form of intravenous (IV) access. It is considered to be a "central" line because the tip of the PICC ends in a large vein in your chest. This large vein is called the superior vena cava (SVC). The PICC tip ends in the SVC because there is a lot of blood flow in the SVC. This allows medicines and IV fluids to be quickly distributed throughout the body. The PICC is inserted using a sterile technique by a specially trained nurse or physician. After the PICC is inserted, a chest X-ray exam is done to be sure it is in the correct place.  A PICC may be placed for different reasons, such as:  To give medicines and liquid nutrition that can only be given through a central line. Examples are:  Certain antibiotic treatments.  Chemotherapy.  Total parenteral nutrition (TPN).  To take frequent blood samples.  To give IV fluids and blood products.  If there is difficulty placing a peripheral intravenous (PIV) catheter. If taken care of properly, a PICC can remain in place for several months. A PICC can also allow a person to go home from the hospital early. Medicine and PICC care can be managed at home by a family member or home health care team. WHAT PROBLEMS CAN HAPPEN WHEN I HAVE A PICC? Problems with a PICC can occasionally occur. These may include the following:  A blood clot (thrombus) forming in or at the tip of the PICC. This can cause the PICC to become clogged. A clot-dissolving medicine called tissue plasminogen activator (tPA) can be given through the PICC to help break up the clot.  Inflammation of the vein (phlebitis) in which the PICC is placed. Signs of inflammation may include redness, pain at the insertion site, red streaks, or being able to feel a "cord" in the vein where the PICC is located.  Infection in the PICC or at the insertion  site. Signs of infection may include fever, chills, redness, swelling, or pus drainage from the PICC insertion site.  PICC movement (malposition). The PICC tip may move from its original position due to excessive physical activity, forceful coughing, sneezing, or vomiting.  A break or cut in the PICC. It is important to not use scissors near the PICC.  Nerve or tendon irritation or injury during PICC insertion. WHAT SHOULD I KEEP IN MIND ABOUT ACTIVITIES WHEN I HAVE A PICC?  You may bend your arm and move it freely. If your PICC is near or at the bend of your elbow, avoid activity with repeated motion at the elbow.  Rest at home for the remainder of the day following PICC line insertion.  Avoid lifting heavy objects as instructed by your health care provider.  Avoid using a crutch with the arm on the same side as your PICC. You may need to use a walker. WHAT SHOULD I KNOW ABOUT MY PICC DRESSING?  Keep your PICC bandage (dressing) clean and dry to prevent infection.  Ask your health care provider when you may shower. Ask your health care provider to teach you how to wrap the PICC when you do take a shower.  Change the PICC dressing as instructed by your health care provider.  Change your PICC dressing if it becomes loose or wet. WHAT SHOULD I KNOW ABOUT PICC CARE?  Check the PICC insertion site   daily for leakage, redness, swelling, or pain.  Do not take a bath, swim, or use hot tubs when you have a PICC. Cover PICC line with clear plastic wrap and tape to keep it dry while showering.  Flush the PICC as directed by your health care provider. Let your health care provider know right away if the PICC is difficult to flush or does not flush. Do not use force to flush the PICC.  Do not use a syringe that is less than 10 mL to flush the PICC.  Never pull or tug on the PICC.  Avoid blood pressure checks on the arm with the PICC.  Keep your PICC identification card with you at all  times.  Do not take the PICC out yourself. Only a trained clinical professional should remove the PICC. SEEK IMMEDIATE MEDICAL CARE IF:  Your PICC is accidentally pulled all the way out. If this happens, cover the insertion site with a bandage or gauze dressing. Do not throw the PICC away. Your health care provider will need to inspect it.  Your PICC was tugged or pulled and has partially come out. Do not  push the PICC back in.  There is any type of drainage, redness, or swelling where the PICC enters the skin.  You cannot flush the PICC, it is difficult to flush, or the PICC leaks around the insertion site when it is flushed.  You hear a "flushing" sound when the PICC is flushed.  You have pain, discomfort, or numbness in your arm, shoulder, or jaw on the same side as the PICC.  You feel your heart "racing" or skipping beats.  You notice a hole or tear in the PICC.  You develop chills or a fever. MAKE SURE YOU:   Understand these instructions.  Will watch your condition.  Will get help right away if you are not doing well or get worse.   This information is not intended to replace advice given to you by your health care provider. Make sure you discuss any questions you have with your health care provider.   Document Released: 03/27/2003 Document Revised: 10/11/2014 Document Reviewed: 05/28/2013 Elsevier Interactive Patient Education 2016 Elsevier Inc.  

## 2016-06-17 ENCOUNTER — Telehealth: Payer: Self-pay | Admitting: *Deleted

## 2016-06-17 ENCOUNTER — Encounter: Payer: Self-pay | Admitting: Hematology and Oncology

## 2016-06-17 NOTE — Telephone Encounter (Signed)
Oncology Nurse Navigator Documentation  Spoke with Russell Williamson.  He voiced understanding of 8:00 appt for biopsy tomorrow.  He stated his nephew is picking him up at 7:30 to bring him to [facility], he should arrive by 7:45.  I asked him to arrive by 7:30 if possible.  He voiced understanding, "I'll see what I can do".  Gayleen Orem, RN, BSN, [place] at [place] [phone]

## 2016-06-17 NOTE — Telephone Encounter (Signed)
  Oncology Nurse Navigator Documentation  Called Mr. Russell Williamson to explain 0730 arrival tomorrow for 0800 bx, unable to LVM (mailbaox not set up).  Unable to reach his sister via mobile, her home phone 'not a working number'.  Gayleen Orem, RN, BSN, [place] at [place] [phone]

## 2016-06-18 ENCOUNTER — Telehealth: Payer: Self-pay | Admitting: Hematology and Oncology

## 2016-06-18 ENCOUNTER — Other Ambulatory Visit (HOSPITAL_COMMUNITY)
Admission: RE | Admit: 2016-06-18 | Discharge: 2016-06-18 | Disposition: A | Discharge status: Home / Self Care | Payer: Medicaid Other | Source: Ambulatory Visit | Attending: Hematology and Oncology | Admitting: Hematology and Oncology

## 2016-06-18 ENCOUNTER — Encounter: Payer: Self-pay | Admitting: *Deleted

## 2016-06-18 ENCOUNTER — Ambulatory Visit (HOSPITAL_BASED_OUTPATIENT_CLINIC_OR_DEPARTMENT_OTHER): Payer: Medicaid Other | Admitting: Hematology and Oncology

## 2016-06-18 ENCOUNTER — Other Ambulatory Visit (HOSPITAL_BASED_OUTPATIENT_CLINIC_OR_DEPARTMENT_OTHER): Payer: Medicaid Other

## 2016-06-18 ENCOUNTER — Ambulatory Visit (HOSPITAL_BASED_OUTPATIENT_CLINIC_OR_DEPARTMENT_OTHER): Payer: Medicaid Other

## 2016-06-18 VITALS — BP 121/65 | HR 60 | Temp 97.9°F | Resp 17

## 2016-06-18 VITALS — BP 129/69 | HR 68 | Temp 98.3°F | Resp 18

## 2016-06-18 DIAGNOSIS — C099 Malignant neoplasm of tonsil, unspecified: Secondary | ICD-10-CM

## 2016-06-18 DIAGNOSIS — D539 Nutritional anemia, unspecified: Secondary | ICD-10-CM | POA: Insufficient documentation

## 2016-06-18 DIAGNOSIS — Z85819 Personal history of malignant neoplasm of unspecified site of lip, oral cavity, and pharynx: Secondary | ICD-10-CM

## 2016-06-18 DIAGNOSIS — D72810 Lymphocytopenia: Secondary | ICD-10-CM | POA: Diagnosis not present

## 2016-06-18 DIAGNOSIS — Z452 Encounter for adjustment and management of vascular access device: Secondary | ICD-10-CM

## 2016-06-18 DIAGNOSIS — Z85818 Personal history of malignant neoplasm of other sites of lip, oral cavity, and pharynx: Secondary | ICD-10-CM

## 2016-06-18 DIAGNOSIS — Z23 Encounter for immunization: Secondary | ICD-10-CM

## 2016-06-18 DIAGNOSIS — Z95828 Presence of other vascular implants and grafts: Secondary | ICD-10-CM

## 2016-06-18 LAB — CBC & DIFF AND RETIC
BASO%: 0.2 % (ref 0.0–2.0)
Basophils Absolute: 0 10*3/uL (ref 0.0–0.1)
EOS%: 0.8 % (ref 0.0–7.0)
Eosinophils Absolute: 0 10*3/uL (ref 0.0–0.5)
HEMATOCRIT: 25.9 % — AB (ref 38.4–49.9)
HGB: 8.6 g/dL — ABNORMAL LOW (ref 13.0–17.1)
Immature Retic Fract: 34 % — ABNORMAL HIGH (ref 3.00–10.60)
LYMPH%: 6.3 % — AB (ref 14.0–49.0)
MCH: 35.5 pg — AB (ref 27.2–33.4)
MCHC: 33.2 g/dL (ref 32.0–36.0)
MCV: 107 fL — AB (ref 79.3–98.0)
MONO#: 0.8 10*3/uL (ref 0.1–0.9)
MONO%: 17.4 % — ABNORMAL HIGH (ref 0.0–14.0)
NEUT%: 75.3 % — ABNORMAL HIGH (ref 39.0–75.0)
NEUTROS ABS: 3.6 10*3/uL (ref 1.5–6.5)
PLATELETS: 138 10*3/uL — AB (ref 140–400)
RBC: 2.42 10*6/uL — AB (ref 4.20–5.82)
RDW: 24.9 % — ABNORMAL HIGH (ref 11.0–14.6)
RETIC CT ABS: 34.61 10*3/uL — AB (ref 34.80–93.90)
Retic %: 1.43 % (ref 0.80–1.80)
WBC: 4.8 10*3/uL (ref 4.0–10.3)
lymph#: 0.3 10*3/uL — ABNORMAL LOW (ref 0.9–3.3)
nRBC: 0 % (ref 0–0)

## 2016-06-18 LAB — BONE MARROW EXAM

## 2016-06-18 MED ORDER — INFLUENZA VAC SPLIT QUAD 0.5 ML IM SUSY
0.5000 mL | PREFILLED_SYRINGE | Freq: Once | INTRAMUSCULAR | Status: AC
  Administered 2016-06-18: 0.5 mL via INTRAMUSCULAR
  Filled 2016-06-18: qty 0.5

## 2016-06-18 MED ORDER — SODIUM CHLORIDE 0.9 % IJ SOLN
10.0000 mL | INTRAMUSCULAR | Status: DC | PRN
  Administered 2016-06-18: 10 mL via INTRAVENOUS
  Filled 2016-06-18: qty 10

## 2016-06-18 MED ORDER — OXYCODONE-ACETAMINOPHEN 5-325 MG PO TABS
ORAL_TABLET | ORAL | Status: AC
  Filled 2016-06-18: qty 1

## 2016-06-18 MED ORDER — HEPARIN SOD (PORK) LOCK FLUSH 100 UNIT/ML IV SOLN
500.0000 [IU] | Freq: Once | INTRAVENOUS | Status: AC | PRN
  Administered 2016-06-18: 500 [IU] via INTRAVENOUS
  Filled 2016-06-18: qty 5

## 2016-06-18 MED ORDER — OXYCODONE-ACETAMINOPHEN 5-325 MG PO TABS
1.0000 | ORAL_TABLET | Freq: Once | ORAL | Status: AC
  Administered 2016-06-18: 1 via ORAL

## 2016-06-18 NOTE — Progress Notes (Signed)
Oncology Nurse Navigator Documentation  Subsequent to yesterday's conversation with [name], spoke with Mr. Russell Williamson in Infusion regarding preferred date/time for [facility] placement. He indicated preference for 9/25 11:30, stating his nephew would be available to provide transportation. I spoke with Brunetta Jeans IR, indicated patient's appt preference, she scheduled. Dr. Alvy Bimler notified.  Gayleen Orem, RN, BSN, [place] at [place] [phone]

## 2016-06-18 NOTE — Progress Notes (Signed)
Time out performed at 0810 with patient and Dr. Alvy Bimler. First injection done at 0818. Procedure started at 0821 and ended at [phone]. Pt tolerated procedure well. Pt laid flat on back to apply pressure to Biopsy site for 51minutes. Labs drawn and PICC dressing changed. 0900 site dressing clean dry and intact and pt reports pain a 1-2 out of 10 at procedure site (right lower back). pt stable at time. Pt educated on discharge instructions and adverse s/s to be aware of and to see scheduling for to make future appts per Dr. Alvy Bimler, pt verbalizes understanding.

## 2016-06-18 NOTE — Telephone Encounter (Signed)
GAVE PATIENT APPOINTMENT SCHEDULE FOR September AND October. APPOINTMENTS SCHEDULED PER 9/15 [facility]. PATIENT STOPPED BY SCHEDULING AFTER BMBX/INFUSION.

## 2016-06-18 NOTE — Patient Instructions (Signed)
Bone Marrow Aspiration and Bone Marrow Biopsy Bone marrow aspiration and bone marrow biopsy are procedures that are done to diagnose blood disorders. You may also have one of these procedures to help diagnose infections or some types of cancer. Bone marrow is the soft tissue that is inside your bones. Blood cells are produced in bone marrow. For bone marrow aspiration, a sample of tissue in liquid form is removed from inside your bone. For a bone marrow biopsy, a small core of bone marrow tissue is removed. Then these samples are examined under a microscope or tested in a lab. You may need these procedures if you have an abnormal complete blood count (CBC). The aspiration or biopsy sample is usually taken from the top of your hip bone. Sometimes, an aspiration sample is taken from your chest bone (sternum). LET YOUR HEALTH CARE PROVIDER KNOW ABOUT:  Any allergies you have.  All medicines you are taking, including vitamins, herbs, eye drops, creams, and over-the-counter medicines.  Previous problems you or members of your family have had with the use of anesthetics.  Any blood disorders you have.  Previous surgeries you have had.  Any medical conditions you may have.  Whether you are pregnant or you think that you may be pregnant. RISKS AND COMPLICATIONS Generally, this is a safe procedure. However, problems may occur, including:  Infection.  Bleeding. BEFORE THE PROCEDURE  Ask your health care provider about:  Changing or stopping your regular medicines. This is especially important if you are taking diabetes medicines or blood thinners.  Taking medicines such as aspirin and ibuprofen. These medicines can thin your blood. Do not take these medicines before your procedure if your health care provider instructs you not to.  Plan to have someone take you home after the procedure.  If you go home right after the procedure, plan to have someone with you for 24 hours. PROCEDURE   An  IV tube may be inserted into one of your veins.  The injection site will be cleaned with a germ-killing solution (antiseptic).  You will be given one or more of the following:  A medicine that helps you relax (sedative).  A medicine that numbs the area (local anesthetic).  The bone marrow sample will be removed as follows:  For an aspiration, a hollow needle will be inserted through your skin and into your bone. Bone marrow fluid will be drawn up into a syringe.  For a biopsy, your health care provider will use a hollow needle to remove a core of tissue from your bone marrow.  The needle will be removed.  A bandage (dressing) will be placed over the insertion site and taped in place. The procedure may vary among health care providers and hospitals. AFTER THE PROCEDURE  Your blood pressure, heart rate, breathing rate, and blood oxygen level will be monitored often until the medicines you were given have worn off.  Return to your normal activities as directed by your health care provider.   This information is not intended to replace advice given to you by your health care provider. Make sure you discuss any questions you have with your health care provider.   Document Released: 09/23/2004 Document Revised: 02/04/2015 Document Reviewed: 09/11/2014 Elsevier Interactive Patient Education 2016 Elsevier Inc.  

## 2016-06-18 NOTE — Progress Notes (Signed)
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 CT chest showed marked emphysema with progression of the irregular bandlike and nodular opacities in the posterior lower lobes of the been present since at least 06/04/2014. Abnormal limited abdominal structures      05/07/2016 Imaging    Slight interval progression of right-greater-than-left subpleural consolidative opacities which are nonspecific. These may representchronic scarring, infection or malignancy. Sequelae of chronic pancreatitis.        INTERVAL HISTORY: Please see below for problem oriented charting. He returns today for bone marrow biopsy. The patient has missed several appointments recently and has been noncompliant. He continues to have chronic back pain. The patient denies any recent signs or symptoms of bleeding such as spontaneous epistaxis, hematuria or hematochezia.   REVIEW OF SYSTEMS:   Constitutional: Denies fevers, chills or abnormal weight loss Eyes: Denies blurriness of vision Ears, nose, mouth, throat, and face: Denies mucositis or sore throat Respiratory: Denies cough, dyspnea or wheezes Cardiovascular: Denies palpitation, chest discomfort or lower extremity swelling Gastrointestinal:  Denies nausea, heartburn or change in bowel habits Skin: Denies abnormal skin rashes Lymphatics: Denies new lymphadenopathy or easy bruising Neurological:Denies numbness, tingling or new weaknesses Behavioral/Psych: Mood is stable, no new changes  All other systems were  reviewed with the patient and are negative.  I have reviewed the past medical history, past surgical history, social history and family history with the patient and they are unchanged from previous note.  ALLERGIES:  has No Known Allergies.  MEDICATIONS:  Current Outpatient Prescriptions  Medication Sig Dispense Refill  . bisacodyl (DULCOLAX) 5 MG EC tablet Take 5 mg by mouth daily as needed for mild constipation or moderate constipation.    . Cholecalciferol (VITAMIN D PO) Take 1,000 mg by mouth daily.     Marland Kitchen Dextromethorphan-Guaifenesin (TUSSIN DM) 10-100 MG/5ML liquid Take 10 mLs by mouth every 12 (twelve) hours.    Marland Kitchen doxylamine, Sleep, (UNISOM) 25 MG tablet Take 75 mg by mouth at bedtime as needed for sleep (sleep).     . folic acid (FOLVITE) 1 MG tablet Take 1 mg by mouth daily.  5  . levothyroxine (SYNTHROID, LEVOTHROID) 75 MCG tablet Take 75 mcg by mouth daily. Reported on 04/16/2016  3  . lipase/protease/amylase (CREON-12/PANCREASE) 12000 UNITS CPEP capsule Take 1 capsule by mouth 4 (four) times daily.    . Omega-3 Fatty Acids (FISH OIL PO) Take 1 capsule by mouth daily.    Marland Kitchen omeprazole (PRILOSEC) 40 MG capsule Take 40 mg by mouth daily.     . ondansetron (ZOFRAN ODT) 8 MG disintegrating tablet 75m ODT q4 hours prn nausea (Patient not taking: Reported on 05/25/2016) 4 tablet 0  . ondansetron (ZOFRAN) 4 MG tablet take 487mby mouth twice daily  2  . oxyCODONE (OXYCONTIN) 10 mg 12 hr tablet Take 1 tablet (10 mg total) by mouth every 12 (twelve) hours. 6 tablet 0  . PROAIR HFA 108 (90 Base) MCG/ACT inhaler Inhale 2 puff into lungs every 4-6 hours as needed for shortness of breath/wheezing  5  .  promethazine (PHENERGAN) 25 MG tablet Take 1 tablet (25 mg total) by mouth every 6 (six) hours as needed for nausea. 20 tablet 0  . temazepam (RESTORIL) 30 MG capsule Take 51m by mouth at bedtime  0   Current Facility-Administered Medications  Medication Dose Route Frequency Provider Last Rate Last  Dose  . sodium chloride 0.9 % injection 10 mL  10 mL Intravenous PRN NHeath Lark MD   10 mL at 06/18/16 0855    PHYSICAL EXAMINATION: ECOG PERFORMANCE STATUS: 0 - Asymptomatic  Vitals:   06/18/16 0814 06/18/16 0829  BP: (!) 121/59 129/69  Pulse: 70 68  Resp: 18 18  Temp: 98.2 F (36.8 C) 98.3 F (36.8 C)   There were no vitals filed for this visit.  GENERAL:alert, no distress and comfortable SKIN: skin color, texture, turgor are normal, no rashes or significant lesions EYES: normal, Conjunctiva are pink and non-injected, sclera clear Musculoskeletal:no cyanosis of digits and no clubbing  NEURO: alert & oriented x 3 with fluent speech, no focal motor/sensory deficits  LABORATORY DATA:  I have reviewed the data as listed    Component Value Date/Time   NA 128 (L) 05/25/2016 1000   NA 134 (L) 05/05/2016 1015   K 4.1 05/25/2016 1000   K 4.5 05/05/2016 1015   CL 93 (L) 05/25/2016 1000   CL 102 02/27/2013 1045   CO2 29 05/25/2016 1000   CO2 30 (H) 05/05/2016 1015   GLUCOSE 100 (H) 05/25/2016 1000   GLUCOSE 97 05/05/2016 1015   GLUCOSE 118 (H) 02/27/2013 1045   BUN 10 05/25/2016 1000   BUN 9.8 05/05/2016 1015   CREATININE 0.42 (L) 05/25/2016 1000   CREATININE 0.7 05/05/2016 1015   CALCIUM 9.2 05/25/2016 1000   CALCIUM 9.4 05/05/2016 1015   PROT 7.5 05/25/2016 1000   PROT 7.6 05/05/2016 1015   ALBUMIN 4.2 05/25/2016 1000   ALBUMIN 3.9 05/05/2016 1015   AST 55 (H) 05/25/2016 1000   AST 39 (H) 05/05/2016 1015   ALT 42 05/25/2016 1000   ALT 35 05/05/2016 1015   ALKPHOS 87 05/25/2016 1000   ALKPHOS 106 05/05/2016 1015   BILITOT 1.0 05/25/2016 1000   BILITOT 0.51 05/05/2016 1015   GFRNONAA >60 05/25/2016 1000   GFRAA >60 05/25/2016 1000    No results found for: SPEP, UPEP  Lab Results  Component Value Date   WBC 4.8 06/18/2016   NEUTROABS 3.6 06/18/2016   HGB 8.6 (L) 06/18/2016   HCT 25.9 (L) 06/18/2016   MCV 107.0 (H) 06/18/2016   PLT 138 (L) 06/18/2016       Chemistry      Component Value Date/Time   NA 128 (L) 05/25/2016 1000   NA 134 (L) 05/05/2016 1015   K 4.1 05/25/2016 1000   K 4.5 05/05/2016 1015   CL 93 (L) 05/25/2016 1000   CL 102 02/27/2013 1045   CO2 29 05/25/2016 1000   CO2 30 (H) 05/05/2016 1015   BUN 10 05/25/2016 1000   BUN 9.8 05/05/2016 1015   CREATININE 0.42 (L) 05/25/2016 1000   CREATININE 0.7 05/05/2016 1015      Component Value Date/Time   CALCIUM 9.2 05/25/2016 1000   CALCIUM 9.4 05/05/2016 1015   ALKPHOS 87 05/25/2016 1000   ALKPHOS 106 05/05/2016 1015   AST 55 (H) 05/25/2016 1000   AST 39 (H) 05/05/2016 1015   ALT 42 05/25/2016 1000   ALT 35 05/05/2016 1015   BILITOT 1.0 05/25/2016 1000   BILITOT  0.51 05/05/2016 1015      ASSESSMENT & PLAN:  Bone Marrow Biopsy and Aspiration Procedure Note   Informed consent was obtained and potential risks including bleeding, infection and pain were reviewed with the patient. The patient's name, date of birth, identification, consent and allergies were verified prior to the start of procedure and time out was performed.  The right posterior iliac crest was chosen as the site of biopsy.  The skin was prepped with Betadine solution.   5 cc of 1% lidocaine was used to provide local anaesthesia.   10 cc of bone marrow aspirate was obtained followed by 1 inch biopsy.   The procedure was tolerated well and there were no complications.  The patient was stable at the end of the procedure.  Specimens sent for flow cytometry, cytogenetics and additional studies.    No orders of the defined types were placed in this encounter.  All questions were answered. The patient knows to call the clinic with any problems, questions or concerns. No barriers to learning was detected. I spent 25 minutes counseling the patient face to face. The total time spent in the appointment was 30 minutes and more than 50% was on counseling and review of test results     [facility], [place],  MD 06/18/2016 11:41 AM

## 2016-06-18 NOTE — Progress Notes (Signed)
Pt HGB 8.6.  No transfusion needed today.

## 2016-06-18 NOTE — Patient Instructions (Signed)

## 2016-06-21 ENCOUNTER — Ambulatory Visit (HOSPITAL_BASED_OUTPATIENT_CLINIC_OR_DEPARTMENT_OTHER): Payer: Medicaid Other

## 2016-06-21 DIAGNOSIS — Z85819 Personal history of malignant neoplasm of unspecified site of lip, oral cavity, and pharynx: Secondary | ICD-10-CM | POA: Diagnosis not present

## 2016-06-21 DIAGNOSIS — Z95828 Presence of other vascular implants and grafts: Secondary | ICD-10-CM

## 2016-06-21 DIAGNOSIS — Z452 Encounter for adjustment and management of vascular access device: Secondary | ICD-10-CM | POA: Diagnosis not present

## 2016-06-21 DIAGNOSIS — Z85818 Personal history of malignant neoplasm of other sites of lip, oral cavity, and pharynx: Secondary | ICD-10-CM

## 2016-06-21 MED ORDER — HEPARIN SOD (PORK) LOCK FLUSH 100 UNIT/ML IV SOLN
500.0000 [IU] | Freq: Once | INTRAVENOUS | Status: AC | PRN
  Administered 2016-06-21: 500 [IU] via INTRAVENOUS
  Filled 2016-06-21: qty 5

## 2016-06-21 MED ORDER — SODIUM CHLORIDE 0.9 % IJ SOLN
10.0000 mL | INTRAMUSCULAR | Status: DC | PRN
  Administered 2016-06-21: 10 mL via INTRAVENOUS
  Filled 2016-06-21: qty 10

## 2016-06-21 NOTE — Patient Instructions (Signed)
PICC Home Guide A peripherally inserted central catheter (PICC) is a long, thin, flexible tube that is inserted into a vein in the upper arm. It is a form of intravenous (IV) access. It is considered to be a "central" line because the tip of the PICC ends in a large vein in your chest. This large vein is called the superior vena cava (SVC). The PICC tip ends in the SVC because there is a lot of blood flow in the SVC. This allows medicines and IV fluids to be quickly distributed throughout the body. The PICC is inserted using a sterile technique by a specially trained nurse or physician. After the PICC is inserted, a chest X-ray exam is done to be sure it is in the correct place.  A PICC may be placed for different reasons, such as:  To give medicines and liquid nutrition that can only be given through a central line. Examples are:  Certain antibiotic treatments.  Chemotherapy.  Total parenteral nutrition (TPN).  To take frequent blood samples.  To give IV fluids and blood products.  If there is difficulty placing a peripheral intravenous (PIV) catheter. If taken care of properly, a PICC can remain in place for several months. A PICC can also allow a person to go home from the hospital early. Medicine and PICC care can be managed at home by a family member or home health care team. WHAT PROBLEMS CAN HAPPEN WHEN I HAVE A PICC? Problems with a PICC can occasionally occur. These may include the following:  A blood clot (thrombus) forming in or at the tip of the PICC. This can cause the PICC to become clogged. A clot-dissolving medicine called tissue plasminogen activator (tPA) can be given through the PICC to help break up the clot.  Inflammation of the vein (phlebitis) in which the PICC is placed. Signs of inflammation may include redness, pain at the insertion site, red streaks, or being able to feel a "cord" in the vein where the PICC is located.  Infection in the PICC or at the insertion  site. Signs of infection may include fever, chills, redness, swelling, or pus drainage from the PICC insertion site.  PICC movement (malposition). The PICC tip may move from its original position due to excessive physical activity, forceful coughing, sneezing, or vomiting.  A break or cut in the PICC. It is important to not use scissors near the PICC.  Nerve or tendon irritation or injury during PICC insertion. WHAT SHOULD I KEEP IN MIND ABOUT ACTIVITIES WHEN I HAVE A PICC?  You may bend your arm and move it freely. If your PICC is near or at the bend of your elbow, avoid activity with repeated motion at the elbow.  Rest at home for the remainder of the day following PICC line insertion.  Avoid lifting heavy objects as instructed by your health care provider.  Avoid using a crutch with the arm on the same side as your PICC. You may need to use a walker. WHAT SHOULD I KNOW ABOUT MY PICC DRESSING?  Keep your PICC bandage (dressing) clean and dry to prevent infection.  Ask your health care provider when you may shower. Ask your health care provider to teach you how to wrap the PICC when you do take a shower.  Change the PICC dressing as instructed by your health care provider.  Change your PICC dressing if it becomes loose or wet. WHAT SHOULD I KNOW ABOUT PICC CARE?  Check the PICC insertion site   daily for leakage, redness, swelling, or pain.  Do not take a bath, swim, or use hot tubs when you have a PICC. Cover PICC line with clear plastic wrap and tape to keep it dry while showering.  Flush the PICC as directed by your health care provider. Let your health care provider know right away if the PICC is difficult to flush or does not flush. Do not use force to flush the PICC.  Do not use a syringe that is less than 10 mL to flush the PICC.  Never pull or tug on the PICC.  Avoid blood pressure checks on the arm with the PICC.  Keep your PICC identification card with you at all  times.  Do not take the PICC out yourself. Only a trained clinical professional should remove the PICC. SEEK IMMEDIATE MEDICAL CARE IF:  Your PICC is accidentally pulled all the way out. If this happens, cover the insertion site with a bandage or gauze dressing. Do not throw the PICC away. Your health care provider will need to inspect it.  Your PICC was tugged or pulled and has partially come out. Do not  push the PICC back in.  There is any type of drainage, redness, or swelling where the PICC enters the skin.  You cannot flush the PICC, it is difficult to flush, or the PICC leaks around the insertion site when it is flushed.  You hear a "flushing" sound when the PICC is flushed.  You have pain, discomfort, or numbness in your arm, shoulder, or jaw on the same side as the PICC.  You feel your heart "racing" or skipping beats.  You notice a hole or tear in the PICC.  You develop chills or a fever. MAKE SURE YOU:   Understand these instructions.  Will watch your condition.  Will get help right away if you are not doing well or get worse.   This information is not intended to replace advice given to you by your health care provider. Make sure you discuss any questions you have with your health care provider.   Document Released: 03/27/2003 Document Revised: 10/11/2014 Document Reviewed: 05/28/2013 Elsevier Interactive Patient Education 2016 Elsevier Inc.  

## 2016-06-23 ENCOUNTER — Encounter: Payer: Self-pay | Admitting: *Deleted

## 2016-06-23 ENCOUNTER — Other Ambulatory Visit: Payer: Self-pay | Admitting: Radiology

## 2016-06-23 ENCOUNTER — Ambulatory Visit (HOSPITAL_BASED_OUTPATIENT_CLINIC_OR_DEPARTMENT_OTHER): Payer: Medicaid Other

## 2016-06-23 DIAGNOSIS — Z452 Encounter for adjustment and management of vascular access device: Secondary | ICD-10-CM

## 2016-06-23 DIAGNOSIS — Z95828 Presence of other vascular implants and grafts: Secondary | ICD-10-CM

## 2016-06-23 DIAGNOSIS — Z85818 Personal history of malignant neoplasm of other sites of lip, oral cavity, and pharynx: Secondary | ICD-10-CM

## 2016-06-23 DIAGNOSIS — Z85819 Personal history of malignant neoplasm of unspecified site of lip, oral cavity, and pharynx: Secondary | ICD-10-CM

## 2016-06-23 MED ORDER — SODIUM CHLORIDE 0.9 % IJ SOLN
10.0000 mL | INTRAMUSCULAR | Status: DC | PRN
  Administered 2016-06-23: 10 mL via INTRAVENOUS
  Filled 2016-06-23: qty 10

## 2016-06-23 MED ORDER — HEPARIN SOD (PORK) LOCK FLUSH 100 UNIT/ML IV SOLN
500.0000 [IU] | Freq: Once | INTRAVENOUS | Status: AC | PRN
  Administered 2016-06-23: 500 [IU] via INTRAVENOUS
  Filled 2016-06-23: qty 5

## 2016-06-23 NOTE — Patient Instructions (Signed)
PICC Home Guide A peripherally inserted central catheter (PICC) is a long, thin, flexible tube that is inserted into a vein in the upper arm. It is a form of intravenous (IV) access. It is considered to be a "central" line because the tip of the PICC ends in a large vein in your chest. This large vein is called the superior vena cava (SVC). The PICC tip ends in the SVC because there is a lot of blood flow in the SVC. This allows medicines and IV fluids to be quickly distributed throughout the body. The PICC is inserted using a sterile technique by a specially trained nurse or physician. After the PICC is inserted, a chest X-ray exam is done to be sure it is in the correct place.  A PICC may be placed for different reasons, such as:  To give medicines and liquid nutrition that can only be given through a central line. Examples are:  Certain antibiotic treatments.  Chemotherapy.  Total parenteral nutrition (TPN).  To take frequent blood samples.  To give IV fluids and blood products.  If there is difficulty placing a peripheral intravenous (PIV) catheter. If taken care of properly, a PICC can remain in place for several months. A PICC can also allow a person to go home from the hospital early. Medicine and PICC care can be managed at home by a family member or home health care team. WHAT PROBLEMS CAN HAPPEN WHEN I HAVE A PICC? Problems with a PICC can occasionally occur. These may include the following:  A blood clot (thrombus) forming in or at the tip of the PICC. This can cause the PICC to become clogged. A clot-dissolving medicine called tissue plasminogen activator (tPA) can be given through the PICC to help break up the clot.  Inflammation of the vein (phlebitis) in which the PICC is placed. Signs of inflammation may include redness, pain at the insertion site, red streaks, or being able to feel a "cord" in the vein where the PICC is located.  Infection in the PICC or at the insertion  site. Signs of infection may include fever, chills, redness, swelling, or pus drainage from the PICC insertion site.  PICC movement (malposition). The PICC tip may move from its original position due to excessive physical activity, forceful coughing, sneezing, or vomiting.  A break or cut in the PICC. It is important to not use scissors near the PICC.  Nerve or tendon irritation or injury during PICC insertion. WHAT SHOULD I KEEP IN MIND ABOUT ACTIVITIES WHEN I HAVE A PICC?  You may bend your arm and move it freely. If your PICC is near or at the bend of your elbow, avoid activity with repeated motion at the elbow.  Rest at home for the remainder of the day following PICC line insertion.  Avoid lifting heavy objects as instructed by your health care provider.  Avoid using a crutch with the arm on the same side as your PICC. You may need to use a walker. WHAT SHOULD I KNOW ABOUT MY PICC DRESSING?  Keep your PICC bandage (dressing) clean and dry to prevent infection.  Ask your health care provider when you may shower. Ask your health care provider to teach you how to wrap the PICC when you do take a shower.  Change the PICC dressing as instructed by your health care provider.  Change your PICC dressing if it becomes loose or wet. WHAT SHOULD I KNOW ABOUT PICC CARE?  Check the PICC insertion site   daily for leakage, redness, swelling, or pain.  Do not take a bath, swim, or use hot tubs when you have a PICC. Cover PICC line with clear plastic wrap and tape to keep it dry while showering.  Flush the PICC as directed by your health care provider. Let your health care provider know right away if the PICC is difficult to flush or does not flush. Do not use force to flush the PICC.  Do not use a syringe that is less than 10 mL to flush the PICC.  Never pull or tug on the PICC.  Avoid blood pressure checks on the arm with the PICC.  Keep your PICC identification card with you at all  times.  Do not take the PICC out yourself. Only a trained clinical professional should remove the PICC. SEEK IMMEDIATE MEDICAL CARE IF:  Your PICC is accidentally pulled all the way out. If this happens, cover the insertion site with a bandage or gauze dressing. Do not throw the PICC away. Your health care provider will need to inspect it.  Your PICC was tugged or pulled and has partially come out. Do not  push the PICC back in.  There is any type of drainage, redness, or swelling where the PICC enters the skin.  You cannot flush the PICC, it is difficult to flush, or the PICC leaks around the insertion site when it is flushed.  You hear a "flushing" sound when the PICC is flushed.  You have pain, discomfort, or numbness in your arm, shoulder, or jaw on the same side as the PICC.  You feel your heart "racing" or skipping beats.  You notice a hole or tear in the PICC.  You develop chills or a fever. MAKE SURE YOU:   Understand these instructions.  Will watch your condition.  Will get help right away if you are not doing well or get worse.   This information is not intended to replace advice given to you by your health care provider. Make sure you discuss any questions you have with your health care provider.   Document Released: 03/27/2003 Document Revised: 10/11/2014 Document Reviewed: 05/28/2013 Elsevier Interactive Patient Education 2016 Elsevier Inc.  

## 2016-06-24 NOTE — Progress Notes (Signed)
Oncology Nurse Navigator Documentation  Met with Mr. Nguyen in [facility] lobby. Provided and reviewed Epic appt calendar, emphasized NPO status and arrival time for next [facility] placement.  He voiced understanding he is to arrive to [facility]. He stated someone will be available to drive him home after procedure.  Gayleen Orem, RN, BSN, [place] [facility] [place] [phone]

## 2016-06-28 ENCOUNTER — Ambulatory Visit (HOSPITAL_COMMUNITY)
Admission: RE | Admit: 2016-06-28 | Discharge: 2016-06-28 | Disposition: A | Discharge status: Home / Self Care | Payer: Medicaid Other | Source: Ambulatory Visit | Attending: Hematology and Oncology | Admitting: Hematology and Oncology

## 2016-06-28 ENCOUNTER — Other Ambulatory Visit: Payer: Self-pay | Admitting: Hematology and Oncology

## 2016-06-28 ENCOUNTER — Encounter (HOSPITAL_COMMUNITY): Payer: Self-pay

## 2016-06-28 DIAGNOSIS — F17210 Nicotine dependence, cigarettes, uncomplicated: Secondary | ICD-10-CM | POA: Diagnosis not present

## 2016-06-28 DIAGNOSIS — Z836 Family history of other diseases of the respiratory system: Secondary | ICD-10-CM | POA: Diagnosis not present

## 2016-06-28 DIAGNOSIS — I878 Other specified disorders of veins: Secondary | ICD-10-CM

## 2016-06-28 DIAGNOSIS — Z8669 Personal history of other diseases of the nervous system and sense organs: Secondary | ICD-10-CM | POA: Diagnosis not present

## 2016-06-28 DIAGNOSIS — I10 Essential (primary) hypertension: Secondary | ICD-10-CM | POA: Insufficient documentation

## 2016-06-28 DIAGNOSIS — D539 Nutritional anemia, unspecified: Secondary | ICD-10-CM

## 2016-06-28 DIAGNOSIS — Z8619 Personal history of other infectious and parasitic diseases: Secondary | ICD-10-CM | POA: Insufficient documentation

## 2016-06-28 DIAGNOSIS — Z85818 Personal history of malignant neoplasm of other sites of lip, oral cavity, and pharynx: Secondary | ICD-10-CM | POA: Diagnosis not present

## 2016-06-28 DIAGNOSIS — Z9049 Acquired absence of other specified parts of digestive tract: Secondary | ICD-10-CM | POA: Diagnosis not present

## 2016-06-28 DIAGNOSIS — M1990 Unspecified osteoarthritis, unspecified site: Secondary | ICD-10-CM | POA: Diagnosis not present

## 2016-06-28 DIAGNOSIS — Z22322 Carrier or suspected carrier of Methicillin resistant Staphylococcus aureus: Secondary | ICD-10-CM | POA: Diagnosis not present

## 2016-06-28 DIAGNOSIS — D61818 Other pancytopenia: Secondary | ICD-10-CM | POA: Insufficient documentation

## 2016-06-28 DIAGNOSIS — E039 Hypothyroidism, unspecified: Secondary | ICD-10-CM | POA: Diagnosis not present

## 2016-06-28 DIAGNOSIS — Z923 Personal history of irradiation: Secondary | ICD-10-CM | POA: Diagnosis not present

## 2016-06-28 HISTORY — PX: IR GENERIC HISTORICAL: IMG1180011

## 2016-06-28 LAB — CBC WITH DIFFERENTIAL/PLATELET
BASOS ABS: 0 10*3/uL (ref 0.0–0.1)
Basophils Relative: 0 %
EOS ABS: 0 10*3/uL (ref 0.0–0.7)
Eosinophils Relative: 0 %
HCT: 28.2 % — ABNORMAL LOW (ref 39.0–52.0)
Hemoglobin: 9.5 g/dL — ABNORMAL LOW (ref 13.0–17.0)
LYMPHS ABS: 0.5 10*3/uL — AB (ref 0.7–4.0)
Lymphocytes Relative: 7 %
MCH: 37.7 pg — AB (ref 26.0–34.0)
MCHC: 33.7 g/dL (ref 30.0–36.0)
MCV: 111.9 fL — AB (ref 78.0–100.0)
MONO ABS: 0.8 10*3/uL (ref 0.1–1.0)
Monocytes Relative: 12 %
NEUTROS ABS: 5.5 10*3/uL (ref 1.7–7.7)
Neutrophils Relative %: 81 %
PLATELETS: 223 10*3/uL (ref 150–400)
RBC: 2.52 MIL/uL — ABNORMAL LOW (ref 4.22–5.81)
WBC: 6.8 10*3/uL (ref 4.0–10.5)

## 2016-06-28 LAB — BASIC METABOLIC PANEL
ANION GAP: 8 (ref 5–15)
BUN: 15 mg/dL (ref 6–20)
CALCIUM: 9 mg/dL (ref 8.9–10.3)
CO2: 33 mmol/L — ABNORMAL HIGH (ref 22–32)
CREATININE: 0.5 mg/dL — AB (ref 0.61–1.24)
Chloride: 95 mmol/L — ABNORMAL LOW (ref 101–111)
GFR calc Af Amer: >60 mL/min (ref >60)
GLUCOSE: 93 mg/dL (ref 65–99)
Potassium: 4.9 mmol/L (ref 3.5–5.1)
Sodium: 136 mmol/L (ref 135–145)

## 2016-06-28 LAB — PROTIME-INR
INR: 1.09
PROTHROMBIN TIME: 14.2 s (ref 11.4–15.2)

## 2016-06-28 MED ORDER — LIDOCAINE-EPINEPHRINE (PF) 2 %-1:200000 IJ SOLN
INTRAMUSCULAR | Status: AC | PRN
  Administered 2016-06-28: 10 mL via INTRADERMAL

## 2016-06-28 MED ORDER — CLINDAMYCIN PHOSPHATE 600 MG/50ML IV SOLN
600.0000 mg | Freq: Once | INTRAVENOUS | Status: AC
  Administered 2016-06-28: 600 mg via INTRAVENOUS
  Filled 2016-06-28: qty 50

## 2016-06-28 MED ORDER — FENTANYL CITRATE (PF) 100 MCG/2ML IJ SOLN
INTRAMUSCULAR | Status: AC | PRN
  Administered 2016-06-28: 50 ug via INTRAVENOUS
  Administered 2016-06-28 (×2): 25 ug via INTRAVENOUS

## 2016-06-28 MED ORDER — LIDOCAINE-EPINEPHRINE (PF) 2 %-1:200000 IJ SOLN
INTRAMUSCULAR | Status: AC
  Filled 2016-06-28: qty 20

## 2016-06-28 MED ORDER — HEPARIN SOD (PORK) LOCK FLUSH 100 UNIT/ML IV SOLN
INTRAVENOUS | Status: AC
  Filled 2016-06-28: qty 5

## 2016-06-28 MED ORDER — MIDAZOLAM HCL 2 MG/2ML IJ SOLN
INTRAMUSCULAR | Status: AC | PRN
  Administered 2016-06-28 (×2): 1 mg via INTRAVENOUS

## 2016-06-28 MED ORDER — SODIUM CHLORIDE 0.9 % IV SOLN
INTRAVENOUS | Status: DC
  Administered 2016-06-28: 12:00:00 via INTRAVENOUS

## 2016-06-28 MED ORDER — FENTANYL CITRATE (PF) 100 MCG/2ML IJ SOLN
INTRAMUSCULAR | Status: AC
  Filled 2016-06-28: qty 2

## 2016-06-28 MED ORDER — MIDAZOLAM HCL 2 MG/2ML IJ SOLN
INTRAMUSCULAR | Status: AC
  Filled 2016-06-28: qty 2

## 2016-06-28 NOTE — Discharge Instructions (Signed)
Moderate Conscious Sedation, Adult, Care After °Refer to this sheet in the next few weeks. These instructions provide you with information on caring for yourself after your procedure. Your health care provider may also give you more specific instructions. Your treatment has been planned according to current medical practices, but problems sometimes occur. Call your health care provider if you have any problems or questions after your procedure. °WHAT TO EXPECT AFTER THE PROCEDURE  °After your procedure: °· You may feel sleepy, clumsy, and have poor balance for several hours. °· Vomiting may occur if you eat too soon after the procedure. °HOME CARE INSTRUCTIONS °· Do not participate in any activities where you could become injured for at least 24 hours. Do not: °· Drive. °· Swim. °· Ride a bicycle. °· Operate heavy machinery. °· Cook. °· Use power tools. °· Climb ladders. °· Work from a high place. °· Do not make important decisions or sign legal documents until you are improved. °· If you vomit, drink water, juice, or soup when you can drink without vomiting. Make sure you have little or no nausea before eating solid foods. °· Only take over-the-counter or prescription medicines for pain, discomfort, or fever as directed by your health care provider. °· Make sure you and your family fully understand everything about the medicines given to you, including what side effects may occur. °· You should not drink alcohol, take sleeping pills, or take medicines that cause drowsiness for at least 24 hours. °· If you smoke, do not smoke without supervision. °· If you are feeling better, you may resume normal activities 24 hours after you were sedated. °· Keep all appointments with your health care provider. °SEEK MEDICAL CARE IF: °· Your skin is pale or bluish in color. °· You continue to feel nauseous or vomit. °· Your pain is getting worse and is not helped by medicine. °· You have bleeding or swelling. °· You are still  sleepy or feeling clumsy after 24 hours. °SEEK IMMEDIATE MEDICAL CARE IF: °· You develop a rash. °· You have difficulty breathing. °· You develop any type of allergic problem. °· You have a fever. °MAKE SURE YOU: °· Understand these instructions. °· Will watch your condition. °· Will get help right away if you are not doing well or get worse. °  °This information is not intended to replace advice given to you by your health care provider. Make sure you discuss any questions you have with your health care provider. °  °Document Released: 07/11/2013 Document Revised: 10/11/2014 Document Reviewed: 07/11/2013 °Elsevier Interactive Patient Education ©2016 Elsevier Inc. °Implanted Port Home Guide °An implanted port is a type of central line that is placed under the skin. Central lines are used to provide IV access when treatment or nutrition needs to be given through a person's veins. Implanted ports are used for long-term IV access. An implanted port may be placed because:  °· You need IV medicine that would be irritating to the small veins in your hands or arms.   °· You need long-term IV medicines, such as antibiotics.   °· You need IV nutrition for a long period.   °· You need frequent blood draws for lab tests.   °· You need dialysis.   °Implanted ports are usually placed in the chest area, but they can also be placed in the upper arm, the abdomen, or the leg. An implanted port has two main parts:  °· Reservoir. The reservoir is round and will appear as a small, raised area under   your skin. The reservoir is the part where a needle is inserted to give medicines or draw blood.   °· Catheter. The catheter is a thin, flexible tube that extends from the reservoir. The catheter is placed into a large vein. Medicine that is inserted into the reservoir goes into the catheter and then into the vein.   °HOW WILL I CARE FOR MY INCISION SITE? °Do not get the incision site wet. Bathe or shower as directed by your health care  provider.  °HOW IS MY PORT ACCESSED? °Special steps must be taken to access the port:  °· Before the port is accessed, a numbing cream can be placed on the skin. This helps numb the skin over the port site.   °· Your health care provider uses a sterile technique to access the port. °¨ Your health care provider must put on a mask and sterile gloves. °¨ The skin over your port is cleaned carefully with an antiseptic and allowed to dry. °¨ The port is gently pinched between sterile gloves, and a needle is inserted into the port. °· Only "non-coring" port needles should be used to access the port. Once the port is accessed, a blood return should be checked. This helps ensure that the port is in the vein and is not clogged.   °· If your port needs to remain accessed for a constant infusion, a clear (transparent) bandage will be placed over the needle site. The bandage and needle will need to be changed every week, or as directed by your health care provider.   °· Keep the bandage covering the needle clean and dry. Do not get it wet. Follow your health care provider's instructions on how to take a shower or bath while the port is accessed.   °· If your port does not need to stay accessed, no bandage is needed over the port.   °WHAT IS FLUSHING? °Flushing helps keep the port from getting clogged. Follow your health care provider's instructions on how and when to flush the port. Ports are usually flushed with saline solution or a medicine called heparin. The need for flushing will depend on how the port is used.  °· If the port is used for intermittent medicines or blood draws, the port will need to be flushed:   °¨ After medicines have been given.   °¨ After blood has been drawn.   °¨ As part of routine maintenance.   °· If a constant infusion is running, the port may not need to be flushed.   °HOW LONG WILL MY PORT STAY IMPLANTED? °The port can stay in for as long as your health care provider thinks it is needed. When it  is time for the port to come out, surgery will be done to remove it. The procedure is similar to the one performed when the port was put in.  °WHEN SHOULD I SEEK IMMEDIATE MEDICAL CARE? °When you have an implanted port, you should seek immediate medical care if:  °· You notice a bad smell coming from the incision site.   °· You have swelling, redness, or drainage at the incision site.   °· You have more swelling or pain at the port site or the surrounding area.   °· You have a fever that is not controlled with medicine. °  °This information is not intended to replace advice given to you by your health care provider. Make sure you discuss any questions you have with your health care provider. °  °Document Released: 09/20/2005 Document Revised: 07/11/2013 Document Reviewed: 05/28/2013 °Elsevier Interactive Patient Education ©  2016 Elsevier Inc. ° °

## 2016-06-28 NOTE — Procedures (Signed)
Successful placement of right CFV approach port-a-cath with tip within the infra-renal IVC. The catheter is ready for immediate use. Estimated Blood Loss: None No immediate post procedural complications.  Ronny Bacon, MD Pager #: [phone]

## 2016-06-28 NOTE — H&P (Signed)
Chief Complaint: tonsillar cancer  Referring Physician:Dr. Heath Lark  Supervising Physician: Sandi Mariscal  Patient Status:  Out-pt  HPI: Russell Williamson is an 60 y.o. male who has a history of tonsillar cancer and also pancytopenia.  He underwent a bone marrow biopsy by Dr. Alvy Bimler on 9-15.  This showed some MDS features, but not conclusive for this.  No other notes have been placed since that time, but a PAC has been requested.  He presents today for this procedure.  He denies any other symptoms except for some epigastric pain.  He has a history of chronic pancreatitis.  Past Medical History:  Past Medical History:  Diagnosis Date  . Anemia   . Arthritis   . Cervical adenopathy 04/26/2012  . Chronic pancreatitis ([place])   . Constipation   . Daily headache 05/23/2012   "small ones"  . Deficiency anemia 04/15/2016  . G tube feedings ([place])    has a feeding tube in stomach  . History of blood transfusion ~ 2004   "from the pancreatitis"  . History of radiation therapy 02/02/2011-03/22/2011   head/neck,left tonsil  . Hx of radiation therapy 02/02/11 to 03/22/11   L tonsil  . Hypertension   . Hypothyroidism 10/29/2013  . 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 Pancytopenia ([place]) 02/28/2014  . Poor venous access 04/28/2016  . 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 Thrush 11/02/2013  . Tonsillar cancer ([place]) 12/15/2010   Left  . Urinary hesitancy     Past Surgical History:  Past Surgical History:  Procedure Laterality Date  . BILE DUCT STENT PLACEMENT     hx of  . CHOLECYSTECTOMY  04/2005  . DIRECT LARYNGOSCOPY  05/23/2012   Procedure: DIRECT LARYNGOSCOPY;  Surgeon: Rozetta Nunnery, MD;  Location: [facility];  Service: ENT;  Laterality: N/A;  . DIRECT LARYNGOSCOPY N/A 03/23/2013   Procedure: DIRECT LARYNGOSCOPY;  Surgeon: Rozetta Nunnery, MD;  Location: [facility];  Service: ENT;  Laterality: N/A;  . ESOPHAGEAL DILATION N/A 03/23/2013   Procedure: ESOPHAGEAL DILATION;  Surgeon: Rozetta Nunnery, MD;  Location: [facility];  Service: ENT;  Laterality: N/A;  . FRACTURE SURGERY    . IR GENERIC HISTORICAL  04/29/2016   IR US GUIDE VASC ACCESS RIGHT 04/29/2016 [facility]  . IR GENERIC HISTORICAL  04/29/2016   IR FLUORO GUIDE CV LINE RIGHT 04/29/2016 [facility]  . IR GENERIC HISTORICAL  05/12/2016   IR US GUIDE VASC ACCESS LEFT 05/12/2016 [facility]  . IR GENERIC HISTORICAL  05/12/2016   IR FLUORO GUIDE CV LINE LEFT 05/12/2016 [facility]  . MASS BIOPSY  05/23/2012   Procedure: NECK MASS BIOPSY;  Surgeon: Rozetta Nunnery, MD;  Location: [facility];  Service: ENT;  Laterality: N/A;  . RADICAL NECK DISSECTION  05/23/2012   w/mass excision  . RADICAL NECK DISSECTION  05/23/2012   Procedure: RADICAL NECK DISSECTION;  Surgeon: Rozetta Nunnery, MD;  Location: [place];  Service: ENT;  Laterality: Left;  . TIBIA FRACTURE SURGERY  1990's   left    Family History:  Family History  Problem Relation Age of Onset  . COPD Mother     Social History:  reports that he has been smoking Cigarettes.  He has a 10.00 pack-year smoking history. He has never used smokeless tobacco. He reports that he uses drugs, including Marijuana. He reports that he does not drink alcohol.  Allergies: No Known  Allergies  Medications: Medications have been reviewed in Epic  Please HPI for pertinent positives, otherwise complete 10 system ROS negative.  Mallampati Score: MD Evaluation Airway: WNL Heart: WNL Abdomen: WNL Chest/ Lungs: WNL ASA  Classification: 2 Mallampati/Airway Score: Two  Physical Exam: BP 116/60 (BP Location: Right Arm)   Pulse 61   Temp 99 F (37.2 C) (Oral)   Resp 18   Ht 5' 6"  (1.676 m)   Wt 93 lb 6 oz (42.4 kg)   SpO2 92%   BMI 15.07 kg/m  Body mass index is 15.07 kg/m. General: pleasant, WD, WN black male who is laying in bed in NAD HEENT: head is normocephalic, atraumatic.  Sclera are noninjected.   PERRL.  Ears and nose without any masses or lesions.  Mouth is pink and moist, no teeth Heart: regular, rate, and rhythm.  Normal s1,s2. No obvious murmurs, gallops, or rubs noted.  Palpable radial and pedal pulses bilaterally Lungs: CTAB, no wheezes, rhonchi, or rales noted.  Respiratory effort nonlabored Abd: soft, slightly tender in epigastrium, ND, +BS, no masses, hernias, or organomegaly Psych: A&Ox3 with an appropriate affect.   Labs: Results for orders placed or performed during the hospital encounter of 06/28/16 (from the past 48 hour(s))  Basic metabolic panel     Status: Abnormal   Collection Time: 06/28/16 12:05 PM  Result Value Ref Range   Sodium 136 135 - 145 mmol/L   Potassium 4.9 3.5 - 5.1 mmol/L   Chloride 95 (L) 101 - 111 mmol/L   CO2 33 (H) 22 - 32 mmol/L   Glucose, Bld 93 65 - 99 mg/dL   BUN 15 6 - 20 mg/dL   Creatinine, Ser 0.50 (L) 0.61 - 1.24 mg/dL   Calcium 9.0 8.9 - 10.3 mg/dL   GFR calc non Af Amer >60 >60 mL/min   GFR calc Af Amer >60 >60 mL/min    Comment: (NOTE) The eGFR has been calculated using the CKD EPI equation. This calculation has not been validated in all clinical situations. eGFR's persistently <60 mL/min signify possible Chronic Kidney Disease.    Anion gap 8 5 - 15  CBC with Differential/Platelet     Status: Abnormal (Preliminary result)   Collection Time: 06/28/16 12:05 PM  Result Value Ref Range   WBC 6.8 4.0 - 10.5 K/uL   RBC 2.52 (L) 4.22 - 5.81 MIL/uL   Hemoglobin 9.5 (L) 13.0 - 17.0 g/dL   HCT 28.2 (L) 39.0 - 52.0 %   MCV 111.9 (H) 78.0 - 100.0 fL   MCH 37.7 (H) 26.0 - 34.0 pg   MCHC 33.7 30.0 - 36.0 g/dL   RDW NOT CALCULATED 11.5 - 15.5 %   Platelets 223 150 - 400 K/uL   Neutrophils Relative % PENDING %   Neutro Abs PENDING 1.7 - 7.7 K/uL   Band Neutrophils PENDING %   Lymphocytes Relative PENDING %   Lymphs Abs PENDING 0.7 - 4.0 K/uL   Monocytes Relative PENDING %   Monocytes Absolute PENDING 0.1 - 1.0 K/uL   Eosinophils  Relative PENDING %   Eosinophils Absolute PENDING 0.0 - 0.7 K/uL   Basophils Relative PENDING %   Basophils Absolute PENDING 0.0 - 0.1 K/uL   WBC Morphology PENDING    RBC Morphology PENDING    Smear Review PENDING    nRBC PENDING 0 /100 WBC   Metamyelocytes Relative PENDING %   Myelocytes PENDING %   Promyelocytes Absolute PENDING %   Blasts PENDING %  Protime-INR     Status: None   Collection Time: 06/28/16 12:05 PM  Result Value Ref Range   Prothrombin Time 14.2 11.4 - 15.2 seconds   INR 1.09     Imaging: No results found.  Assessment/Plan 1. H/o tonsillar cancer and pancytopenia -we will proceed today with PAC placement.  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 -vitals and labs have been reviewed -Risks and Benefits discussed with the patient including, but not limited to bleeding, infection, pneumothorax, or fibrin sheath development and need for additional procedures. All of the patient's questions were answered, patient is agreeable to proceed. Consent signed and in chart.  Thank you for this interesting consult.  I greatly enjoyed meeting Hewitt Garner and look forward to participating in their care.  A copy of this report was sent to the requesting provider on this date.  Electronically Signed: Henreitta Cea 06/28/2016, 1:08 PM   I spent a total of  30 Minutes  in face to face in clinical consultation, greater than 50% of which was counseling/coordinating care for h/o tonsillar cancer, pancytopenia

## 2016-06-29 ENCOUNTER — Other Ambulatory Visit: Payer: Self-pay | Admitting: *Deleted

## 2016-06-29 MED ORDER — LIDOCAINE-PRILOCAINE 2.5-2.5 % EX CREA: 1.0000 "application " | TOPICAL_CREAM | CUTANEOUS | 3 refills | Status: DC | PRN

## 2016-06-30 ENCOUNTER — Telehealth: Payer: Self-pay | Admitting: *Deleted

## 2016-06-30 LAB — CHROMOSOME ANALYSIS, BONE MARROW

## 2016-06-30 LAB — TISSUE HYBRIDIZATION (BONE MARROW)-NCBH

## 2016-06-30 NOTE — Telephone Encounter (Signed)
FYI "I have an appointment today but I won't be able to make it, I can hardly walk since this port-a-cath was placed in my right leg.  A pain shoots up my right leg like I've received an electric shock.  I take the oxycodone.  I am able to walk it's just painful."  Thanked him for calling but the appointment today was for the PICC line and has been cancelled.  Advised he do R.O.M. Exercises and not lie on the right leg.  Call if no improvement or worsens, otherwise we'll see him Jul 05, 2016 at 9:15.  No further questions or complaints.

## 2016-07-02 ENCOUNTER — Other Ambulatory Visit: Payer: Self-pay | Admitting: *Deleted

## 2016-07-05 ENCOUNTER — Telehealth: Payer: Self-pay | Admitting: Hematology and Oncology

## 2016-07-05 ENCOUNTER — Other Ambulatory Visit (HOSPITAL_BASED_OUTPATIENT_CLINIC_OR_DEPARTMENT_OTHER): Payer: Medicaid Other

## 2016-07-05 ENCOUNTER — Ambulatory Visit (HOSPITAL_BASED_OUTPATIENT_CLINIC_OR_DEPARTMENT_OTHER): Payer: Medicaid Other | Admitting: Hematology and Oncology

## 2016-07-05 ENCOUNTER — Ambulatory Visit (HOSPITAL_BASED_OUTPATIENT_CLINIC_OR_DEPARTMENT_OTHER): Payer: Medicaid Other

## 2016-07-05 VITALS — BP 122/74 | HR 86 | Temp 98.4°F | Resp 19 | Wt 101.9 lb

## 2016-07-05 DIAGNOSIS — Z95828 Presence of other vascular implants and grafts: Secondary | ICD-10-CM

## 2016-07-05 DIAGNOSIS — Z85818 Personal history of malignant neoplasm of other sites of lip, oral cavity, and pharynx: Secondary | ICD-10-CM

## 2016-07-05 DIAGNOSIS — Z85819 Personal history of malignant neoplasm of unspecified site of lip, oral cavity, and pharynx: Secondary | ICD-10-CM

## 2016-07-05 DIAGNOSIS — I878 Other specified disorders of veins: Secondary | ICD-10-CM

## 2016-07-05 DIAGNOSIS — E440 Moderate protein-calorie malnutrition: Secondary | ICD-10-CM

## 2016-07-05 DIAGNOSIS — D61818 Other pancytopenia: Secondary | ICD-10-CM

## 2016-07-05 DIAGNOSIS — Z452 Encounter for adjustment and management of vascular access device: Secondary | ICD-10-CM

## 2016-07-05 DIAGNOSIS — C099 Malignant neoplasm of tonsil, unspecified: Secondary | ICD-10-CM

## 2016-07-05 LAB — CBC & DIFF AND RETIC
BASO%: 0.3 % (ref 0.0–2.0)
BASOS ABS: 0 10*3/uL (ref 0.0–0.1)
EOS%: 0.9 % (ref 0.0–7.0)
Eosinophils Absolute: 0.1 10*3/uL (ref 0.0–0.5)
HEMATOCRIT: 27.4 % — AB (ref 38.4–49.9)
HGB: 9.2 g/dL — ABNORMAL LOW (ref 13.0–17.1)
Immature Retic Fract: 25 % — ABNORMAL HIGH (ref 3.00–10.60)
LYMPH%: 7.7 % — ABNORMAL LOW (ref 14.0–49.0)
MCH: 37.8 pg — AB (ref 27.2–33.4)
MCHC: 33.5 g/dL (ref 32.0–36.0)
MCV: 113 fL — ABNORMAL HIGH (ref 79.3–98.0)
MONO#: 0.9 10*3/uL (ref 0.1–0.9)
MONO%: 15.1 % — AB (ref 0.0–14.0)
NEUT#: 4.4 10*3/uL (ref 1.5–6.5)
NEUT%: 76 % — ABNORMAL HIGH (ref 39.0–75.0)
Platelets: 170 10*3/uL (ref 140–400)
RBC: 2.42 10*6/uL — AB (ref 4.20–5.82)
RDW: 27.7 % — AB (ref 11.0–14.6)
RETIC %: 1.49 % (ref 0.80–1.80)
Retic Ct Abs: 36.06 10*3/uL (ref 34.80–93.90)
WBC: 5.7 10*3/uL (ref 4.0–10.3)
lymph#: 0.4 10*3/uL — ABNORMAL LOW (ref 0.9–3.3)

## 2016-07-05 MED ORDER — HEPARIN SOD (PORK) LOCK FLUSH 100 UNIT/ML IV SOLN
500.0000 [IU] | Freq: Once | INTRAVENOUS | Status: AC | PRN
  Administered 2016-07-05: 500 [IU] via INTRAVENOUS
  Filled 2016-07-05: qty 5

## 2016-07-05 MED ORDER — SODIUM CHLORIDE 0.9 % IJ SOLN
10.0000 mL | INTRAMUSCULAR | Status: DC | PRN
  Administered 2016-07-05: 10 mL via INTRAVENOUS
  Filled 2016-07-05: qty 10

## 2016-07-05 NOTE — Assessment & Plan Note (Signed)
He has severe pancytopenia of unknown etiology. The very high erythropoietin level suggested possible aplastic anemia. I recommend we proceed with bone marrow aspirate and biopsy for further workup and he agreed to proceed. Repeat bone marrow biopsy is not consistent with MDS His blood count has stabilized over the past 6 weeks. I will see him back again next month for further evaluation. Upon discussion with the pathologist, the bone marrow  suppression is likely related to alcoholism.  I recommend the patient to stop drinking immediately

## 2016-07-05 NOTE — Assessment & Plan Note (Signed)
I am very concerned with his recent progressive weight loss. He had been to the ED in May with hemoptysis CT scan showed no signs of local recurrence.  We discussed the importance of smoking cessation

## 2016-07-05 NOTE — Assessment & Plan Note (Signed)
PICC line was discontinued and the patient currently has a femoral port

## 2016-07-05 NOTE — Assessment & Plan Note (Signed)
He had recent profound weight loss Ct showed pancreatitis and fluid collection I have consulted dietitian He has gained weight since I last saw him.

## 2016-07-05 NOTE — Progress Notes (Signed)
[facility] OFFICE PROGRESS NOTE  Patient Care Team: Dwight M Williams, MD as PCP - General (Specialist) Christopher E Newman, MD (Otolaryngology) John Moody, MD (Radiation Oncology) Ronald F Kulinski, DDS as Consulting Physician (Dentistry) Ni Gorsuch, MD as Consulting Physician (Hematology and Oncology)  SUMMARY OF ONCOLOGIC HISTORY:   History of cancer tonsil   08/20/2011 Initial Diagnosis    Malignant neoplasm of tonsil (HCC)      04/23/2016 Imaging    CT neck is negative. CT chest showed marked emphysema with progression of the irregular bandlike and nodular opacities in the posterior lower lobes of the been present since at least 06/04/2014. Abnormal limited abdominal structures      05/07/2016 Imaging    Slight interval progression of right-greater-than-left subpleural consolidative opacities which are nonspecific. These may representchronic scarring, infection or malignancy. Sequelae of chronic pancreatitis.       06/28/2016 Procedure    Successful placement of a right common femoral vein approach power injectable Port-A-Cath. The catheter is ready for immediate use       INTERVAL HISTORY: Please see below for problem oriented charting. He returns to review bone marrow biopsy report. He has gained some weight since the last time I saw him. Denies recent infection The patient denies any recent signs or symptoms of bleeding such as spontaneous epistaxis, hematuria or hematochezia.  REVIEW OF SYSTEMS:   Constitutional: Denies fevers, chills or abnormal weight loss Eyes: Denies blurriness of vision Ears, nose, mouth, throat, and face: Denies mucositis or sore throat Respiratory: Denies cough, dyspnea or wheezes Cardiovascular: Denies palpitation, chest discomfort or lower extremity swelling Gastrointestinal:  Denies nausea, heartburn or change in bowel habits Skin: Denies abnormal skin rashes Lymphatics: Denies new lymphadenopathy or easy  bruising Neurological:Denies numbness, tingling or new weaknesses Behavioral/Psych: Mood is stable, no new changes  All other systems were reviewed with the patient and are negative.  I have reviewed the past medical history, past surgical history, social history and family history with the patient and they are unchanged from previous note.  ALLERGIES:  has No Known Allergies.  MEDICATIONS:  Current Outpatient Prescriptions  Medication Sig Dispense Refill  . bisacodyl (DULCOLAX) 5 MG EC tablet Take 5 mg by mouth daily as needed for mild constipation or moderate constipation.    . Cholecalciferol (VITAMIN D PO) Take 1,000 mg by mouth daily.     . Dextromethorphan-Guaifenesin (TUSSIN DM) 10-100 MG/5ML liquid Take 10 mLs by mouth every 12 (twelve) hours.    . doxylamine, Sleep, (UNISOM) 25 MG tablet Take 75 mg by mouth at bedtime as needed for sleep (sleep).     . folic acid (FOLVITE) 1 MG tablet Take 1 mg by mouth daily.  5  . levothyroxine (SYNTHROID, LEVOTHROID) 75 MCG tablet Take 75 mcg by mouth daily. Reported on 04/16/2016  3  . lidocaine-prilocaine (EMLA) cream Apply 1 application topically as needed. To port a cath site one hour before needle stick. 30 g 3  . lipase/protease/amylase (CREON-12/PANCREASE) 12000 UNITS CPEP capsule Take 1 capsule by mouth 4 (four) times daily.    . Omega-3 Fatty Acids (FISH OIL PO) Take 1 capsule by mouth daily.    . omeprazole (PRILOSEC) 40 MG capsule Take 40 mg by mouth daily.     . ondansetron (ZOFRAN ODT) 8 MG disintegrating tablet 8mg ODT q4 hours prn nausea 4 tablet 0  . ondansetron (ZOFRAN) 4 MG tablet take 4mg by mouth twice daily  2  . oxyCODONE (OXYCONTIN) 10 mg   12 hr tablet Take 1 tablet (10 mg total) by mouth every 12 (twelve) hours. 6 tablet 0  . PROAIR HFA 108 (90 Base) MCG/ACT inhaler Inhale 2 puff into lungs every 4-6 hours as needed for shortness of breath/wheezing  5  . promethazine (PHENERGAN) 25 MG tablet Take 1 tablet (25 mg total) by  mouth every 6 (six) hours as needed for nausea. 20 tablet 0  . temazepam (RESTORIL) 30 MG capsule Take 30mg by mouth at bedtime  0   No current facility-administered medications for this visit.     PHYSICAL EXAMINATION: ECOG PERFORMANCE STATUS: 0 - Asymptomatic  Vitals:   07/05/16 0956  BP: 122/74  Pulse: 86  Resp: 19  Temp: 98.4 F (36.9 C)   Filed Weights   07/05/16 0956  Weight: 101 lb 14.4 oz (46.2 kg)    GENERAL:alert, no distress and comfortable SKIN: skin color, texture, turgor are normal, no rashes or significant lesions EYES: normal, Conjunctiva are pink and non-injected, sclera clear Musculoskeletal:no cyanosis of digits and no clubbing  NEURO: alert & oriented x 3 with fluent speech, no focal motor/sensory deficits  LABORATORY DATA:  I have reviewed the data as listed    Component Value Date/Time   NA 136 06/28/2016 1205   NA 134 (L) 05/05/2016 1015   K 4.9 06/28/2016 1205   K 4.5 05/05/2016 1015   CL 95 (L) 06/28/2016 1205   CL 102 02/27/2013 1045   CO2 33 (H) 06/28/2016 1205   CO2 30 (H) 05/05/2016 1015   GLUCOSE 93 06/28/2016 1205   GLUCOSE 97 05/05/2016 1015   GLUCOSE 118 (H) 02/27/2013 1045   BUN 15 06/28/2016 1205   BUN 9.8 05/05/2016 1015   CREATININE 0.50 (L) 06/28/2016 1205   CREATININE 0.7 05/05/2016 1015   CALCIUM 9.0 06/28/2016 1205   CALCIUM 9.4 05/05/2016 1015   PROT 7.5 05/25/2016 1000   PROT 7.6 05/05/2016 1015   ALBUMIN 4.2 05/25/2016 1000   ALBUMIN 3.9 05/05/2016 1015   AST 55 (H) 05/25/2016 1000   AST 39 (H) 05/05/2016 1015   ALT 42 05/25/2016 1000   ALT 35 05/05/2016 1015   ALKPHOS 87 05/25/2016 1000   ALKPHOS 106 05/05/2016 1015   BILITOT 1.0 05/25/2016 1000   BILITOT 0.51 05/05/2016 1015   GFRNONAA >60 06/28/2016 1205   GFRAA >60 06/28/2016 1205    No results found for: SPEP, UPEP  Lab Results  Component Value Date   WBC 5.7 07/05/2016   NEUTROABS 4.4 07/05/2016   HGB 9.2 (L) 07/05/2016   HCT 27.4 (L) 07/05/2016    MCV 113.0 (H) 07/05/2016   PLT 170 07/05/2016      Chemistry      Component Value Date/Time   NA 136 06/28/2016 1205   NA 134 (L) 05/05/2016 1015   K 4.9 06/28/2016 1205   K 4.5 05/05/2016 1015   CL 95 (L) 06/28/2016 1205   CL 102 02/27/2013 1045   CO2 33 (H) 06/28/2016 1205   CO2 30 (H) 05/05/2016 1015   BUN 15 06/28/2016 1205   BUN 9.8 05/05/2016 1015   CREATININE 0.50 (L) 06/28/2016 1205   CREATININE 0.7 05/05/2016 1015      Component Value Date/Time   CALCIUM 9.0 06/28/2016 1205   CALCIUM 9.4 05/05/2016 1015   ALKPHOS 87 05/25/2016 1000   ALKPHOS 106 05/05/2016 1015   AST 55 (H) 05/25/2016 1000   AST 39 (H) 05/05/2016 1015   ALT 42 05/25/2016 1000   ALT   35 05/05/2016 1015   BILITOT 1.0 05/25/2016 1000   BILITOT 0.51 05/05/2016 1015     ASSESSMENT & PLAN:  History of cancer tonsil I am very concerned with his recent progressive weight loss. He had been to the ED in May with hemoptysis CT scan showed no signs of local recurrence.  We discussed the importance of smoking cessation  Pancytopenia (HCC) He has severe pancytopenia of unknown etiology. The very high erythropoietin level suggested possible aplastic anemia. I recommend we proceed with bone marrow aspirate and biopsy for further workup and he agreed to proceed. Repeat bone marrow biopsy is not consistent with MDS His blood count has stabilized over the past 6 weeks. I will see him back again next month for further evaluation. Upon discussion with the pathologist, the bone marrow  suppression is likely related to alcoholism.  I recommend the patient to stop drinking immediately   Poor venous access PICC line was discontinued and the patient currently has a femoral port  Malnutrition of moderate degree (HCC) He had recent profound weight loss Ct showed pancreatitis and fluid collection I have consulted dietitian He has gained weight since I last saw him.   Orders Placed This Encounter  Procedures   . Hold Tube, Blood Bank    Standing Status:   Standing    Number of Occurrences:   9    Standing Expiration Date:   07/05/2017   All questions were answered. The patient knows to call the clinic with any problems, questions or concerns. No barriers to learning was detected. I spent 15 minutes counseling the patient face to face. The total time spent in the appointment was 20 minutes and more than 50% was on counseling and review of test results     Ni Gorsuch, MD 07/05/2016 10:32 AM   

## 2016-07-05 NOTE — Telephone Encounter (Signed)
Gave patient avs report and appointments for November.  °

## 2016-07-10 DIAGNOSIS — Z79899 Other long term (current) drug therapy: Secondary | ICD-10-CM | POA: Diagnosis not present

## 2016-07-10 DIAGNOSIS — I10 Essential (primary) hypertension: Secondary | ICD-10-CM | POA: Diagnosis not present

## 2016-07-10 DIAGNOSIS — R1013 Epigastric pain: Secondary | ICD-10-CM | POA: Diagnosis not present

## 2016-07-10 DIAGNOSIS — J449 Chronic obstructive pulmonary disease, unspecified: Secondary | ICD-10-CM | POA: Insufficient documentation

## 2016-07-10 DIAGNOSIS — Z85818 Personal history of malignant neoplasm of other sites of lip, oral cavity, and pharynx: Secondary | ICD-10-CM | POA: Diagnosis not present

## 2016-07-10 DIAGNOSIS — F1290 Cannabis use, unspecified, uncomplicated: Secondary | ICD-10-CM | POA: Diagnosis not present

## 2016-07-10 DIAGNOSIS — F17210 Nicotine dependence, cigarettes, uncomplicated: Secondary | ICD-10-CM | POA: Diagnosis not present

## 2016-07-10 DIAGNOSIS — E039 Hypothyroidism, unspecified: Secondary | ICD-10-CM | POA: Diagnosis not present

## 2016-07-10 DIAGNOSIS — R1011 Right upper quadrant pain: Secondary | ICD-10-CM | POA: Diagnosis present

## 2016-07-11 ENCOUNTER — Emergency Department (HOSPITAL_COMMUNITY): Payer: Medicaid Other

## 2016-07-11 ENCOUNTER — Encounter (HOSPITAL_COMMUNITY): Payer: Self-pay | Admitting: Emergency Medicine

## 2016-07-11 ENCOUNTER — Emergency Department (HOSPITAL_COMMUNITY)
Admission: EM | Admit: 2016-07-11 | Discharge: 2016-07-11 | Disposition: A | Discharge status: Home / Self Care | Payer: Medicaid Other | Attending: Emergency Medicine | Admitting: Emergency Medicine

## 2016-07-11 DIAGNOSIS — R1013 Epigastric pain: Secondary | ICD-10-CM

## 2016-07-11 HISTORY — DX: Chronic obstructive pulmonary disease, unspecified: J44.9

## 2016-07-11 LAB — URINALYSIS, ROUTINE W REFLEX MICROSCOPIC
BILIRUBIN URINE: NEGATIVE
GLUCOSE, UA: NEGATIVE mg/dL
HGB URINE DIPSTICK: NEGATIVE
KETONES UR: NEGATIVE mg/dL
LEUKOCYTES UA: NEGATIVE
Nitrite: NEGATIVE
PH: 7 (ref 5.0–8.0)
Protein, ur: NEGATIVE mg/dL
Specific Gravity, Urine: 1.007 (ref 1.005–1.030)

## 2016-07-11 LAB — COMPREHENSIVE METABOLIC PANEL
ALBUMIN: 3.6 g/dL (ref 3.5–5.0)
ALT: 25 U/L (ref 17–63)
AST: 33 U/L (ref 15–41)
Alkaline Phosphatase: 80 U/L (ref 38–126)
Anion gap: 6 (ref 5–15)
BUN: 10 mg/dL (ref 6–20)
CHLORIDE: 94 mmol/L — AB (ref 101–111)
CO2: 31 mmol/L (ref 22–32)
Calcium: 8.7 mg/dL — ABNORMAL LOW (ref 8.9–10.3)
Creatinine, Ser: 0.47 mg/dL — ABNORMAL LOW (ref 0.61–1.24)
GFR calc Af Amer: >60 mL/min (ref >60)
GFR calc non Af Amer: >60 mL/min (ref >60)
GLUCOSE: 106 mg/dL — AB (ref 65–99)
POTASSIUM: 4 mmol/L (ref 3.5–5.1)
Sodium: 131 mmol/L — ABNORMAL LOW (ref 135–145)
Total Bilirubin: 0.4 mg/dL (ref 0.3–1.2)
Total Protein: 6.5 g/dL (ref 6.5–8.1)

## 2016-07-11 LAB — CBC
HEMATOCRIT: 25.2 % — AB (ref 39.0–52.0)
Hemoglobin: 8.5 g/dL — ABNORMAL LOW (ref 13.0–17.0)
MCH: 37.9 pg — AB (ref 26.0–34.0)
MCHC: 33.7 g/dL (ref 30.0–36.0)
MCV: 112.5 fL — AB (ref 78.0–100.0)
Platelets: 140 10*3/uL — ABNORMAL LOW (ref 150–400)
RBC: 2.24 MIL/uL — ABNORMAL LOW (ref 4.22–5.81)
WBC: 3.6 10*3/uL — ABNORMAL LOW (ref 4.0–10.5)

## 2016-07-11 LAB — LIPASE, BLOOD: LIPASE: 38 U/L (ref 11–51)

## 2016-07-11 MED ORDER — ONDANSETRON HCL 4 MG/2ML IJ SOLN
4.0000 mg | Freq: Once | INTRAMUSCULAR | Status: AC
  Administered 2016-07-11: 4 mg via INTRAVENOUS
  Filled 2016-07-11: qty 2

## 2016-07-11 MED ORDER — HYDROMORPHONE HCL 1 MG/ML IJ SOLN
1.0000 mg | Freq: Once | INTRAMUSCULAR | Status: AC
Start: 2016-07-11 — End: 2016-07-11
  Administered 2016-07-11: 1 mg via INTRAVENOUS
  Filled 2016-07-11: qty 1

## 2016-07-11 MED ORDER — HYDROMORPHONE HCL 1 MG/ML IJ SOLN
1.0000 mg | Freq: Once | INTRAMUSCULAR | Status: AC
  Administered 2016-07-11: 1 mg via INTRAVENOUS
  Filled 2016-07-11: qty 1

## 2016-07-11 MED ORDER — IOPAMIDOL (ISOVUE-300) INJECTION 61%
15.0000 mL | Freq: Once | INTRAVENOUS | Status: AC | PRN
  Administered 2016-07-11: 15 mL via ORAL

## 2016-07-11 MED ORDER — HEPARIN SOD (PORK) LOCK FLUSH 100 UNIT/ML IV SOLN
500.0000 [IU] | Freq: Once | INTRAVENOUS | Status: AC
  Administered 2016-07-11: 500 [IU]
  Filled 2016-07-11: qty 5

## 2016-07-11 MED ORDER — IOPAMIDOL (ISOVUE-300) INJECTION 61%
100.0000 mL | Freq: Once | INTRAVENOUS | Status: AC | PRN
  Administered 2016-07-11: 100 mL via INTRAVENOUS

## 2016-07-11 NOTE — ED Provider Notes (Signed)
[facility] DEPT Provider Note   CSN: [id number] Arrival date & time: 07/10/16  2357   By signing my name below, I, Russell Williamson, attest that this documentation has been prepared under the direction and in the presence of [name], MD . Electronically Signed: [name], Scribe. 07/11/2016. 12:46 AM.   History   Chief Complaint Chief Complaint  Patient presents with  . Abdominal Pain  . Hemoptysis    The history is provided by the patient. No language interpreter was used.   HPI Comments:  Russell Williamson is a 60 y.o. male who presents to the Emergency Department complaining of moderate, mid upper abdominal pain which began a few hours ago. Pt reports h/o pancreatitis, notes pain today is similar. He denies ETOH use. has taken tylenol without relief. He also denies fever, blood in his stool, and vomiting. Pt is currently receiving treatment for throat CA.   Past Medical History:  Diagnosis Date  . Anemia   . Arthritis   . Cervical adenopathy 04/26/2012  . Chronic pancreatitis ([place])   . Constipation   . COPD (chronic obstructive pulmonary disease) ([place])   . Daily headache 05/23/2012   "small ones"  . Deficiency anemia 04/15/2016  . G tube feedings ([place])    has a feeding tube in stomach  . History of blood transfusion ~ 2004   "from the pancreatitis"  . History of radiation therapy 02/02/2011-03/22/2011   head/neck,left tonsil  . Hx of radiation therapy 02/02/11 to 03/22/11   L tonsil  . Hypertension   . Hypothyroidism 10/29/2013  . 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 Pancytopenia ([place]) 02/28/2014  . Poor venous access 04/28/2016  . 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 Thrush 11/02/2013  . 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 Urinary hesitancy     Patient Active Problem List   Diagnosis Date Noted  . 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 PICC (peripherally inserted central catheter) flush 05/07/2016  . Port catheter in  place 05/05/2016  . Poor venous access 04/28/2016  . Weight loss 04/16/2016  . Deficiency anemia 04/15/2016  . Chronic leukopenia 04/19/2015  . Anemia in chronic illness 04/19/2015  . Weight loss, unintentional 04/19/2015  . Relapsing chronic pancreatitis ([place]) 01/17/2015  . Diarrhea 09/04/2014  . 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 Malnutrition of moderate degree ([place]) 09/03/2014  . Hypokalemia 09/02/2014  . Pancreatitis 09/01/2014  . Chronic alcoholic pancreatitis ([place]) 09/01/2014  . Mucositis 06/17/2014  . Preventive measure 06/17/2014  . Chronic neck pain 06/17/2014  . S/P gastrostomy ([place]) 06/17/2014  . Nicotine abuse 02/28/2014  . Pancytopenia ([place]) 02/28/2014  . Hypothyroidism (acquired) 11/02/2013  . Hypothyroidism 10/29/2013  . 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 Cervical adenopathy 04/26/2012  . Hiatal hernia   . Hx of radiation therapy   . HTN (hypertension) 10/16/2011  . History of cancer tonsil 08/20/2011  . Chronic pancreatitis [facility])     Past Surgical History:  Procedure Laterality Date  . BILE DUCT STENT PLACEMENT     hx of  . CHOLECYSTECTOMY  04/2005  . DIRECT LARYNGOSCOPY  05/23/2012   Procedure: DIRECT LARYNGOSCOPY;  Surgeon: Rozetta Nunnery, MD;  Location: [facility];  Service: ENT;  Laterality: N/A;  . DIRECT LARYNGOSCOPY N/A 03/23/2013   Procedure: DIRECT LARYNGOSCOPY;  Surgeon: Rozetta Nunnery, MD;  Location: [facility];  Service: ENT;  Laterality: N/A;  . ESOPHAGEAL DILATION N/A 03/23/2013   Procedure: ESOPHAGEAL DILATION;  Surgeon: Harrell Gave  Lincoln Maxin, MD;  Location: [facility];  Service: ENT;  Laterality: N/A;  . FRACTURE SURGERY    . IR GENERIC HISTORICAL  04/29/2016   IR US GUIDE VASC ACCESS RIGHT 04/29/2016 [facility]  . IR GENERIC HISTORICAL  04/29/2016   IR FLUORO GUIDE CV LINE RIGHT 04/29/2016 [facility]  . IR GENERIC HISTORICAL  05/12/2016   IR US GUIDE VASC ACCESS LEFT 05/12/2016 [facility]  . IR GENERIC HISTORICAL  05/12/2016     IR FLUORO GUIDE CV LINE LEFT 05/12/2016 [facility]  . IR GENERIC HISTORICAL  06/28/2016   IR US GUIDE VASC ACCESS RIGHT 06/28/2016 Sandi Mariscal, MD [facility]  . IR GENERIC HISTORICAL  06/28/2016   IR FLUORO GUIDE PORT INSERTION RIGHT 06/28/2016 Sandi Mariscal, MD [facility]  . MASS BIOPSY  05/23/2012   Procedure: NECK MASS BIOPSY;  Surgeon: Rozetta Nunnery, MD;  Location: [facility];  Service: ENT;  Laterality: N/A;  . RADICAL NECK DISSECTION  05/23/2012   w/mass excision  . RADICAL NECK DISSECTION  05/23/2012   Procedure: RADICAL NECK DISSECTION;  Surgeon: Rozetta Nunnery, MD;  Location: [facility];  Service: ENT;  Laterality: Left;  . TIBIA FRACTURE SURGERY  1990's   left       Home Medications    Prior to Admission medications   Medication Sig Start Date End Date Taking? Authorizing Provider  bisacodyl (DULCOLAX) 5 MG EC tablet Take 5 mg by mouth daily as needed for mild constipation or moderate constipation.   Yes Historical Provider, MD  Cholecalciferol (VITAMIN D PO) Take 1,000 mg by mouth every morning.    Yes Historical Provider, MD  Dextromethorphan-Guaifenesin (TUSSIN DM) 10-100 MG/5ML liquid Take 10 mLs by mouth every 8 (eight) hours as needed (cough).    Yes Historical Provider, MD  doxylamine, Sleep, (UNISOM) 25 MG tablet Take 75 mg by mouth at bedtime as needed for sleep (sleep).    Yes Historical Provider, MD  folic acid (FOLVITE) 1 MG tablet Take 1 mg by mouth every morning.  05/14/15  Yes Historical Provider, MD  levothyroxine (SYNTHROID, LEVOTHROID) 75 MCG tablet Take 75 mcg by mouth daily before breakfast. Reported on 04/16/2016 05/14/15  Yes Historical Provider, MD  lidocaine-prilocaine (EMLA) cream Apply 1 application topically as needed. To port a cath site one hour before needle stick. 06/29/16  Yes Heath Lark, MD  lipase/protease/amylase (CREON-12/PANCREASE) 12000 UNITS CPEP capsule Take 1 capsule by mouth 4 (four) times daily.   Yes Historical Provider, MD   mirtazapine (REMERON) 30 MG tablet Take 30 mg by mouth at bedtime. 07/01/16  Yes Historical Provider, MD  Omega-3 Fatty Acids (FISH OIL PO) Take 1 capsule by mouth every morning.    Yes Historical Provider, MD  ondansetron (ZOFRAN) 4 MG tablet take 4mg  by mouth twice daily 02/11/16  Yes Historical Provider, MD  oxyCODONE (ROXICODONE) 15 MG immediate release tablet Take 15 mg by mouth every 4 (four) hours as needed for pain.  06/25/16  Yes Historical Provider, MD  PROAIR HFA 108 (90 Base) MCG/ACT inhaler Inhale 2 puff into lungs every 4-6 hours as needed for shortness of breath/wheezing 02/02/16  Yes Historical Provider, MD  promethazine (PHENERGAN) 25 MG tablet Take 1 tablet (25 mg total) by mouth every 6 (six) hours as needed for nausea. 05/25/16  Yes Heather Laisure, PA-C  temazepam (RESTORIL) 30 MG capsule Take 30 mg by mouth at bedtime as needed for sleep.  06/25/16  Yes Historical Provider, MD  traZODone (DESYREL) 100 MG tablet Take 100 mg by mouth at bedtime. 06/05/16  Yes Historical Provider, MD  ondansetron (ZOFRAN ODT) 8 MG disintegrating tablet 8mg  ODT q4 hours prn nausea Patient not taking: Reported on 07/11/2016 [id number]   Delora Fuel, MD  oxyCODONE (OXYCONTIN) 10 mg 12 hr tablet Take 1 tablet (10 mg total) by mouth every 12 (twelve) hours. Patient not taking: Reported on 07/11/2016 05/25/16   Hyman Bible, PA-C    Family History Family History  Problem Relation Age of Onset  . COPD Mother     Social History Social History  Substance Use Topics  . Smoking status: Current Every Day Smoker    Packs/day: 0.25    Years: 40.00    Types: Cigarettes  . Smokeless tobacco: Never Used     Comment: hx 1/2 -1 PPD  . Alcohol use No     Comment: 05/23/2012 hx alcohol abuse, quit ~ 2000     Allergies   Review of patient's allergies indicates no known allergies.   Review of Systems Review of Systems  Constitutional: Negative for fever.  Gastrointestinal: Positive for abdominal pain.  Negative for blood in stool and vomiting.  All other systems reviewed and are negative.    Physical Exam Updated Vital Signs BP 123/70   Pulse 88   Temp 97.9 F (36.6 C) (Oral)   Resp 18   SpO2 95%   Physical Exam  Constitutional: He is oriented to person, place, and time. He appears cachectic.  Non-toxic appearance. No distress.  HENT:  Head: Normocephalic and atraumatic.  Eyes: Conjunctivae, EOM and lids are normal. Pupils are equal, round, and reactive to light.  Neck: Normal range of motion. Neck supple. No tracheal deviation present. No thyroid mass present.  Cardiovascular: Normal rate, regular rhythm and normal heart sounds.  Exam reveals no gallop.   No murmur heard. Pulmonary/Chest: Effort normal and breath sounds normal. No stridor. No respiratory distress. He has no decreased breath sounds. He has no wheezes. He has no rhonchi. He has no rales.  Abdominal: Soft. Normal appearance and bowel sounds are normal. He exhibits no distension. There is tenderness. There is no rebound, no guarding and no CVA tenderness.  Mild epigastric and RUQ tenderness  Musculoskeletal: Normal range of motion. He exhibits no edema or tenderness.  Neurological: He is alert and oriented to person, place, and time. He has normal strength. No cranial nerve deficit or sensory deficit. GCS eye subscore is 4. GCS verbal subscore is 5. GCS motor subscore is 6.  Skin: Skin is warm and dry. No abrasion and no rash noted.  Psychiatric: He has a normal mood and affect. His speech is normal and behavior is normal.  Nursing note and vitals reviewed.    ED Treatments / Results  DIAGNOSTIC STUDIES:  Oxygen Saturation is 90% on RA, low by my interpretation.    COORDINATION OF CARE:  12:45 AM Discussed treatment plan with pt at bedside and pt agreed to plan.  Labs (all labs ordered are listed, but only abnormal results are displayed) Labs Reviewed  COMPREHENSIVE METABOLIC PANEL - Abnormal; Notable for  the following:       Result Value   Sodium 131 (*)    Chloride 94 (*)    Glucose, Bld 106 (*)    Creatinine, Ser 0.47 (*)    Calcium 8.7 (*)    All other components within normal limits  CBC - Abnormal; Notable for the following:    WBC 3.6 (*)  RBC 2.24 (*)    Hemoglobin 8.5 (*)    HCT 25.2 (*)    MCV 112.5 (*)    MCH 37.9 (*)    Platelets 140 (*)    All other components within normal limits  LIPASE, BLOOD  URINALYSIS, ROUTINE W REFLEX MICROSCOPIC (NOT AT [facility])    EKG  EKG Interpretation None       Radiology No results found.  Procedures Procedures (including critical care time)  Medications Ordered in ED Medications  HYDROmorphone (DILAUDID) injection 1 mg (1 mg Intravenous Given 07/11/16 0104)  ondansetron (ZOFRAN) injection 4 mg (4 mg Intravenous Given 07/11/16 0104)  iopamidol (ISOVUE-300) 61 % injection 15 mL (15 mLs Oral Contrast Given 07/11/16 0219)     Initial Impression / Assessment and Plan / ED Course  I have reviewed the triage vital signs and the nursing notes.  Pertinent labs & imaging results that were available during my care of the patient were reviewed by me and considered in my medical decision making (see chart for details).  Clinical Course    Patient medicated with hydromorphone 2. Pain. CT scan consistent with chronic pancreatitis. Patient stable for discharge  Final Clinical Impressions(s) / ED Diagnoses   Final diagnoses:  None    New Prescriptions New Prescriptions   No medications on file    I personally performed the services described in this documentation, which was scribed in my presence. The recorded information has been reviewed and is accurate.      [name], MD 07/11/16 [phone]

## 2016-07-11 NOTE — ED Triage Notes (Signed)
Pt from home with upper right sided abdominal pain that is typical for his pancreatitis. Pt states this pain began tonight. Pt states he has coughs all the time due to his COPD and he coughed up a moderate amount of blood in one episode. Pt states this happened around 2310, and has not happened since. Pt has a history of cancer on his cancer, but he states he "hasn't been seen for that in awhile". Pt rates his abdominal pain 9/10. Pt denies n/v/d

## 2016-07-15 ENCOUNTER — Telehealth: Payer: Self-pay | Admitting: *Deleted

## 2016-07-15 NOTE — Telephone Encounter (Signed)
Tried to return call to [facility] who left phone number [phone] in service at this time was the voice message, tried to call her home number and mobile number in epic, no answer on home phone and mobile number out of serviec at this time, we haven't taken care of this patient since 2014, he is under Dr. Alvy Bimler care at this time still,niece was wanting a referral for his primary MD, his Primary Md has closed their office, unable to contact niece 10:54 AM

## 2016-07-16 ENCOUNTER — Encounter (HOSPITAL_COMMUNITY): Payer: Self-pay

## 2016-08-12 ENCOUNTER — Ambulatory Visit (HOSPITAL_BASED_OUTPATIENT_CLINIC_OR_DEPARTMENT_OTHER): Payer: Medicaid Other | Admitting: Hematology and Oncology

## 2016-08-12 ENCOUNTER — Ambulatory Visit (HOSPITAL_BASED_OUTPATIENT_CLINIC_OR_DEPARTMENT_OTHER): Payer: Medicaid Other

## 2016-08-12 ENCOUNTER — Telehealth: Payer: Self-pay | Admitting: Hematology and Oncology

## 2016-08-12 ENCOUNTER — Encounter: Payer: Self-pay | Admitting: Hematology and Oncology

## 2016-08-12 ENCOUNTER — Other Ambulatory Visit (HOSPITAL_BASED_OUTPATIENT_CLINIC_OR_DEPARTMENT_OTHER): Payer: Medicaid Other

## 2016-08-12 VITALS — BP 160/71 | HR 68 | Temp 98.0°F | Resp 18 | Ht 66.0 in | Wt 100.1 lb

## 2016-08-12 DIAGNOSIS — Z85818 Personal history of malignant neoplasm of other sites of lip, oral cavity, and pharynx: Secondary | ICD-10-CM

## 2016-08-12 DIAGNOSIS — D539 Nutritional anemia, unspecified: Secondary | ICD-10-CM | POA: Diagnosis not present

## 2016-08-12 DIAGNOSIS — K861 Other chronic pancreatitis: Secondary | ICD-10-CM | POA: Diagnosis not present

## 2016-08-12 DIAGNOSIS — D61818 Other pancytopenia: Secondary | ICD-10-CM

## 2016-08-12 DIAGNOSIS — Z95828 Presence of other vascular implants and grafts: Secondary | ICD-10-CM

## 2016-08-12 DIAGNOSIS — Z720 Tobacco use: Secondary | ICD-10-CM | POA: Diagnosis not present

## 2016-08-12 DIAGNOSIS — E440 Moderate protein-calorie malnutrition: Secondary | ICD-10-CM | POA: Diagnosis not present

## 2016-08-12 DIAGNOSIS — C099 Malignant neoplasm of tonsil, unspecified: Secondary | ICD-10-CM

## 2016-08-12 DIAGNOSIS — Z452 Encounter for adjustment and management of vascular access device: Secondary | ICD-10-CM

## 2016-08-12 LAB — CBC & DIFF AND RETIC
BASO%: 0.2 % (ref 0.0–2.0)
BASOS ABS: 0 10*3/uL (ref 0.0–0.1)
EOS ABS: 0 10*3/uL (ref 0.0–0.5)
EOS%: 0.7 % (ref 0.0–7.0)
HEMATOCRIT: 28.4 % — AB (ref 38.4–49.9)
HEMOGLOBIN: 9.5 g/dL — AB (ref 13.0–17.1)
Immature Retic Fract: 21.6 % — ABNORMAL HIGH (ref 3.00–10.60)
LYMPH%: 14.9 % (ref 14.0–49.0)
MCH: 39.1 pg — AB (ref 27.2–33.4)
MCHC: 33.5 g/dL (ref 32.0–36.0)
MCV: 116.9 fL — AB (ref 79.3–98.0)
MONO#: 0.3 10*3/uL (ref 0.1–0.9)
MONO%: 8.2 % (ref 0.0–14.0)
NEUT#: 3.2 10*3/uL (ref 1.5–6.5)
NEUT%: 76 % — AB (ref 39.0–75.0)
PLATELETS: 153 10*3/uL (ref 140–400)
RBC: 2.43 10*6/uL — ABNORMAL LOW (ref 4.20–5.82)
RDW: 18.6 % — ABNORMAL HIGH (ref 11.0–14.6)
RETIC %: 2.64 % — AB (ref 0.80–1.80)
Retic Ct Abs: 64.15 10*3/uL (ref 34.80–93.90)
WBC: 4.2 10*3/uL (ref 4.0–10.3)
lymph#: 0.6 10*3/uL — ABNORMAL LOW (ref 0.9–3.3)

## 2016-08-12 MED ORDER — HEPARIN SOD (PORK) LOCK FLUSH 100 UNIT/ML IV SOLN
500.0000 [IU] | Freq: Once | INTRAVENOUS | Status: AC | PRN
  Administered 2016-08-12: 500 [IU] via INTRAVENOUS
  Filled 2016-08-12: qty 5

## 2016-08-12 MED ORDER — SODIUM CHLORIDE 0.9 % IJ SOLN
10.0000 mL | INTRAMUSCULAR | Status: DC | PRN
  Administered 2016-08-12: 10 mL via INTRAVENOUS
  Filled 2016-08-12: qty 10

## 2016-08-12 NOTE — Assessment & Plan Note (Signed)
He has history severe anemia, now with chronic mild anemia Bone marrow biopsy excluded malignancy His chronic anemia is likely due to his chronic pancreatitis He does not need blood transfusion but due to history of blood transfusion poor venous access, I will continue to keep his port for now. He will come back for port flush every other month and I'll see him back in 6 months

## 2016-08-12 NOTE — Progress Notes (Signed)
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 CT chest showed marked emphysema with progression of the irregular bandlike and nodular opacities in the posterior lower lobes of the been present since at least 06/04/2014. Abnormal limited abdominal structures      05/07/2016 Imaging    Slight interval progression of right-greater-than-left subpleural consolidative opacities which are nonspecific. These may representchronic scarring, infection or malignancy. Sequelae of chronic pancreatitis.       06/28/2016 Procedure    Successful placement of a right common femoral vein approach power injectable Port-A-Cath. The catheter is ready for immediate use       INTERVAL HISTORY: Please see below for problem oriented charting. He returns for follow-up. He continues to have chronic pain He has been abstinent from recent alcoholism but continue to smoke The patient denies any recent signs or symptoms of bleeding such as spontaneous epistaxis, hematuria or hematochezia.  REVIEW OF SYSTEMS:   Constitutional: Denies fevers, chills or abnormal weight loss Eyes: Denies blurriness of vision Ears, nose, mouth, throat, and face: Denies mucositis or sore throat Respiratory: Denies cough, dyspnea or wheezes Cardiovascular: Denies palpitation, chest discomfort or lower extremity swelling Gastrointestinal:  Denies nausea, heartburn or change in bowel habits Skin: Denies abnormal skin rashes Lymphatics: Denies new lymphadenopathy or easy  bruising Neurological:Denies numbness, tingling or new weaknesses Behavioral/Psych: Mood is stable, no new changes  All other systems were reviewed with the patient and are negative.  I have reviewed the past medical history, past surgical history, social history and family history with the patient and they are unchanged from previous note.  ALLERGIES:  has No Known Allergies.  MEDICATIONS:  Current Outpatient Prescriptions  Medication Sig Dispense Refill  . Dextromethorphan-Guaifenesin (TUSSIN DM) 10-100 MG/5ML liquid Take 10 mLs by mouth every 8 (eight) hours as needed (cough).     Marland Kitchen doxylamine, Sleep, (UNISOM) 25 MG tablet Take 75 mg by mouth at bedtime as needed for sleep (sleep).     . folic acid (FOLVITE) 1 MG tablet Take 1 mg by mouth every morning.   5  . lidocaine-prilocaine (EMLA) cream Apply 1 application topically as needed. To port a cath site one hour before needle stick. 30 g 3  . lipase/protease/amylase (CREON-12/PANCREASE) 12000 UNITS CPEP capsule Take 1 capsule by mouth 4 (four) times daily.    . Omega-3 Fatty Acids (FISH OIL PO) Take 1 capsule by mouth every morning.     . ondansetron (ZOFRAN ODT) 8 MG disintegrating tablet 30m ODT q4 hours prn nausea 4 tablet 0  . oxyCODONE (ROXICODONE) 15 MG immediate release tablet Take 15 mg by mouth every 4 (four) hours as needed for pain.   0  . PROAIR HFA 108 (90 Base) MCG/ACT inhaler Inhale 2 puff into lungs every 4-6 hours as needed for shortness of breath/wheezing  5  . promethazine (PHENERGAN) 25 MG tablet Take 1 tablet (25 mg total) by mouth every 6 (six) hours as needed for nausea. 20 tablet 0  . temazepam (RESTORIL) 30 MG capsule Take 30 mg by mouth at  bedtime as needed for sleep.   0  . traZODone (DESYREL) 100 MG tablet Take 100 mg by mouth at bedtime.  6  . levothyroxine (SYNTHROID, LEVOTHROID) 75 MCG tablet Take 75 mcg by mouth daily before breakfast. Reported on 04/16/2016  3   No current facility-administered medications  for this visit.     PHYSICAL EXAMINATION: ECOG PERFORMANCE STATUS: 1 - Symptomatic but completely ambulatory  Vitals:   08/12/16 1255  BP: (!) 160/71  Pulse: 68  Resp: 18  Temp: 98 F (36.7 C)   Filed Weights   08/12/16 1255  Weight: 100 lb 1.6 oz (45.4 kg)    GENERAL:alert, no distress and comfortable-. He looks thin and cachectic SKIN: skin color, texture, turgor are normal, no rashes or significant lesions EYES: normal, Conjunctiva are pink and non-injected, sclera clear Musculoskeletal:no cyanosis of digits and no clubbing  NEURO: alert & oriented x 3 with fluent speech, no focal motor/sensory deficits  LABORATORY DATA:  I have reviewed the data as listed    Component Value Date/Time   NA 131 (L) 07/11/2016 0040   NA 134 (L) 05/05/2016 1015   K 4.0 07/11/2016 0040   K 4.5 05/05/2016 1015   CL 94 (L) 07/11/2016 0040   CL 102 02/27/2013 1045   CO2 31 07/11/2016 0040   CO2 30 (H) 05/05/2016 1015   GLUCOSE 106 (H) 07/11/2016 0040   GLUCOSE 97 05/05/2016 1015   GLUCOSE 118 (H) 02/27/2013 1045   BUN 10 07/11/2016 0040   BUN 9.8 05/05/2016 1015   CREATININE 0.47 (L) 07/11/2016 0040   CREATININE 0.7 05/05/2016 1015   CALCIUM 8.7 (L) 07/11/2016 0040   CALCIUM 9.4 05/05/2016 1015   PROT 6.5 07/11/2016 0040   PROT 7.6 05/05/2016 1015   ALBUMIN 3.6 07/11/2016 0040   ALBUMIN 3.9 05/05/2016 1015   AST 33 07/11/2016 0040   AST 39 (H) 05/05/2016 1015   ALT 25 07/11/2016 0040   ALT 35 05/05/2016 1015   ALKPHOS 80 07/11/2016 0040   ALKPHOS 106 05/05/2016 1015   BILITOT 0.4 07/11/2016 0040   BILITOT 0.51 05/05/2016 1015   GFRNONAA >60 07/11/2016 0040   GFRAA >60 07/11/2016 0040    No results found for: SPEP, UPEP  Lab Results  Component Value Date   WBC 4.2 08/12/2016   NEUTROABS 3.2 08/12/2016   HGB 9.5 (L) 08/12/2016   HCT 28.4 (L) 08/12/2016   MCV 116.9 (H) 08/12/2016   PLT 153 08/12/2016      Chemistry      Component Value Date/Time   NA 131 (L)  07/11/2016 0040   NA 134 (L) 05/05/2016 1015   K 4.0 07/11/2016 0040   K 4.5 05/05/2016 1015   CL 94 (L) 07/11/2016 0040   CL 102 02/27/2013 1045   CO2 31 07/11/2016 0040   CO2 30 (H) 05/05/2016 1015   BUN 10 07/11/2016 0040   BUN 9.8 05/05/2016 1015   CREATININE 0.47 (L) 07/11/2016 0040   CREATININE 0.7 05/05/2016 1015      Component Value Date/Time   CALCIUM 8.7 (L) 07/11/2016 0040   CALCIUM 9.4 05/05/2016 1015   ALKPHOS 80 07/11/2016 0040   ALKPHOS 106 05/05/2016 1015   AST 33 07/11/2016 0040   AST 39 (H) 05/05/2016 1015   ALT 25 07/11/2016 0040   ALT 35 05/05/2016 1015   BILITOT 0.4 07/11/2016 0040   BILITOT 0.51 05/05/2016 1015      ASSESSMENT & PLAN:  History of cancer tonsil CT  scan showed no signs of local recurrence.  We discussed the importance of smoking cessation The patient is a long-term cancer survivor with no evidence of disease 5 years out Technically, he can be discharged from the cancer center but due to ongoing chronic anemia I will see him back in 6 months  Malnutrition of moderate degree ([place]) He had recent profound weight loss Ct showed pancreatitis and fluid collection I have consulted dietitian He has gained weight since I last saw him. The chronic pancreatitis will likely be the cause of his chronic malnutrition  Chronic pancreatitis ([place]) The patient has chronic pain, malabsorption and malnutrition related to chronic pancreatitis I am not sure how I can help the patient but recommend referral to GI for further evaluation and management. The patient is clear that he has not drink alcohol for many months  Deficiency anemia He has history severe anemia, now with chronic mild anemia Bone marrow biopsy excluded malignancy His chronic anemia is likely due to his chronic pancreatitis He does not need blood transfusion but due to history of blood transfusion poor venous access, I will continue to keep his port for now. He will come back for port  flush every other month and I'll see him back in 6 months   Orders Placed This Encounter  Procedures  . Ambulatory referral to Gastroenterology    Referral Priority:   Routine    Referral Type:   Consultation    Referral Reason:   Specialty Services Required    Number of Visits Requested:   1   All questions were answered. The patient knows to call the clinic with any problems, questions or concerns. No barriers to learning was detected. I spent 15 minutes counseling the patient face to face. The total time spent in the appointment was 20 minutes and more than 50% was on counseling and review of test results     Heath Lark, MD 08/12/2016 2:33 PM

## 2016-08-12 NOTE — Assessment & Plan Note (Signed)
The patient has chronic pain, malabsorption and malnutrition related to chronic pancreatitis I am not sure how I can help the patient but recommend referral to GI for further evaluation and management. The patient is clear that he has not drink alcohol for many months 

## 2016-08-12 NOTE — Assessment & Plan Note (Signed)
CT scan showed no signs of local recurrence.  We discussed the importance of smoking cessation The patient is a long-term cancer survivor with no evidence of disease 5 years out Technically, he can be discharged from the cancer center but due to ongoing chronic anemia I will see him back in 6 months

## 2016-08-12 NOTE — Assessment & Plan Note (Signed)
He had recent profound weight loss Ct showed pancreatitis and fluid collection I have consulted dietitian He has gained weight since I last saw him. The chronic pancreatitis will likely be the cause of his chronic malnutrition

## 2016-08-12 NOTE — Telephone Encounter (Signed)
Gave patient avs report and appointments for January thru April.  °

## 2016-08-16 ENCOUNTER — Telehealth: Payer: Self-pay | Admitting: *Deleted

## 2016-08-20 ENCOUNTER — Telehealth: Payer: Self-pay | Admitting: *Deleted

## 2016-08-20 NOTE — Telephone Encounter (Signed)
Called [facility] to f/u on Referral made from Dr. Alvy Bimler.  They have received referral and tried to contact pt twice, but his phone number is disconnected.  Attempted to call pt's sister at home number and cell phone but there is no answer and no VM.  Attempted to call pt's home/cell number but also no answer and no VM.

## 2016-08-20 NOTE — Telephone Encounter (Signed)
Will do!

## 2016-08-20 NOTE — Telephone Encounter (Signed)
Oncology Nurse Navigator Documentation  Called pt sister at mobile # (home # non-working, patient # disconnected) with information re referral to [facility].  No answer and unable to LVM.  Gayleen Orem, RN, BSN, [place] at [place] [phone]

## 2016-08-21 ENCOUNTER — Telehealth: Payer: Self-pay | Admitting: *Deleted

## 2016-08-21 NOTE — Telephone Encounter (Signed)
Oncology Nurse Navigator Documentation  Called patient and his sister at listed phone numbers re referral appt with [facility]; no answer, unable to LVM.  Gayleen Orem, RN, BSN, [place] at [place] [phone]

## 2016-08-23 ENCOUNTER — Telehealth: Payer: Self-pay | Admitting: *Deleted

## 2016-08-23 NOTE — Telephone Encounter (Signed)
Oncology Nurse Navigator Documentation  Called patient and sister re consult with [facility], no answer at listed mobile phone #s, unable to leave message.  Gayleen Orem, RN, BSN, [place] at [place] [phone]

## 2016-09-08 ENCOUNTER — Telehealth: Payer: Self-pay | Admitting: *Deleted

## 2016-09-08 ENCOUNTER — Encounter: Payer: Self-pay | Admitting: *Deleted

## 2016-09-08 NOTE — Telephone Encounter (Signed)
Oncology Nurse Navigator Documentation  Unable to reach patient or sister at listed phone numbers re scheduling of appt with [facility].  Letter sent with appt guidance.  Gayleen Orem, RN, BSN, [place] Oncology Nurse [place] at [facility] [phone]

## 2016-09-15 ENCOUNTER — Encounter: Payer: Self-pay | Admitting: Hematology and Oncology

## 2016-10-05 ENCOUNTER — Telehealth: Payer: Self-pay | Admitting: *Deleted

## 2016-10-05 NOTE — Telephone Encounter (Signed)
Oncology Nurse Navigator Documentation  Received call from Mr. Riebel, confirmed his understanding of 10/07/16 1400 flush appt. He stated he received on 12/29 letter dated 12/3 re need to call [facility] to arrange appt, he is calling them today.  He stated his sister had stockpiled his mail. He confirmed new phone number, I made change in Demographics.  Gayleen Orem, RN, BSN, [place] Nurse [place] [facility] [place] [phone]

## 2016-10-07 ENCOUNTER — Encounter (HOSPITAL_COMMUNITY): Payer: Self-pay

## 2016-10-07 ENCOUNTER — Emergency Department (HOSPITAL_COMMUNITY)
Admission: EM | Admit: 2016-10-07 | Discharge: 2016-10-07 | Disposition: A | Discharge status: Home / Self Care | Payer: Medicaid Other | Attending: Emergency Medicine | Admitting: Emergency Medicine

## 2016-10-07 ENCOUNTER — Emergency Department (HOSPITAL_COMMUNITY): Payer: Medicaid Other

## 2016-10-07 ENCOUNTER — Telehealth: Payer: Self-pay | Admitting: *Deleted

## 2016-10-07 DIAGNOSIS — I10 Essential (primary) hypertension: Secondary | ICD-10-CM | POA: Insufficient documentation

## 2016-10-07 DIAGNOSIS — J181 Lobar pneumonia, unspecified organism: Secondary | ICD-10-CM | POA: Insufficient documentation

## 2016-10-07 DIAGNOSIS — K861 Other chronic pancreatitis: Secondary | ICD-10-CM | POA: Insufficient documentation

## 2016-10-07 DIAGNOSIS — R1013 Epigastric pain: Secondary | ICD-10-CM | POA: Diagnosis present

## 2016-10-07 DIAGNOSIS — Z8589 Personal history of malignant neoplasm of other organs and systems: Secondary | ICD-10-CM | POA: Diagnosis not present

## 2016-10-07 DIAGNOSIS — J449 Chronic obstructive pulmonary disease, unspecified: Secondary | ICD-10-CM | POA: Diagnosis not present

## 2016-10-07 DIAGNOSIS — F17210 Nicotine dependence, cigarettes, uncomplicated: Secondary | ICD-10-CM | POA: Insufficient documentation

## 2016-10-07 DIAGNOSIS — J189 Pneumonia, unspecified organism: Secondary | ICD-10-CM

## 2016-10-07 DIAGNOSIS — E039 Hypothyroidism, unspecified: Secondary | ICD-10-CM | POA: Diagnosis not present

## 2016-10-07 DIAGNOSIS — Z85818 Personal history of malignant neoplasm of other sites of lip, oral cavity, and pharynx: Secondary | ICD-10-CM | POA: Insufficient documentation

## 2016-10-07 LAB — I-STAT TROPONIN, ED: TROPONIN I, POC: 0 ng/mL (ref 0.00–0.08)

## 2016-10-07 LAB — COMPREHENSIVE METABOLIC PANEL
ALBUMIN: 3.9 g/dL (ref 3.5–5.0)
ALT: 34 U/L (ref 17–63)
AST: 46 U/L — AB (ref 15–41)
Alkaline Phosphatase: 77 U/L (ref 38–126)
Anion gap: 7 (ref 5–15)
BUN: 12 mg/dL (ref 6–20)
CHLORIDE: 99 mmol/L — AB (ref 101–111)
CO2: 28 mmol/L (ref 22–32)
CREATININE: 0.51 mg/dL — AB (ref 0.61–1.24)
Calcium: 8.9 mg/dL (ref 8.9–10.3)
GFR calc non Af Amer: >60 mL/min (ref >60)
GLUCOSE: 103 mg/dL — AB (ref 65–99)
Potassium: 3.6 mmol/L (ref 3.5–5.1)
SODIUM: 134 mmol/L — AB (ref 135–145)
Total Bilirubin: 0.3 mg/dL (ref 0.3–1.2)
Total Protein: 7.1 g/dL (ref 6.5–8.1)

## 2016-10-07 LAB — CBC
HCT: 33.3 % — ABNORMAL LOW (ref 39.0–52.0)
Hemoglobin: 11.5 g/dL — ABNORMAL LOW (ref 13.0–17.0)
MCH: 37 pg — ABNORMAL HIGH (ref 26.0–34.0)
MCHC: 34.5 g/dL (ref 30.0–36.0)
MCV: 107.1 fL — AB (ref 78.0–100.0)
Platelets: 154 10*3/uL (ref 150–400)
RBC: 3.11 MIL/uL — AB (ref 4.22–5.81)
RDW: 14.6 % (ref 11.5–15.5)
WBC: 3 10*3/uL — ABNORMAL LOW (ref 4.0–10.5)

## 2016-10-07 LAB — LIPASE, BLOOD: LIPASE: 16 U/L (ref 11–51)

## 2016-10-07 MED ORDER — ALBUTEROL SULFATE HFA 108 (90 BASE) MCG/ACT IN AERS
2.0000 | INHALATION_SPRAY | Freq: Once | RESPIRATORY_TRACT | Status: AC
  Administered 2016-10-07: 2 via RESPIRATORY_TRACT
  Filled 2016-10-07: qty 6.7

## 2016-10-07 MED ORDER — OXYCODONE HCL 5 MG/5ML PO SOLN
10.0000 mg | Freq: Once | ORAL | Status: AC
  Administered 2016-10-07: 10 mg via ORAL
  Filled 2016-10-07: qty 10

## 2016-10-07 MED ORDER — LEVOFLOXACIN 500 MG PO TABS: 500.0000 mg | ORAL_TABLET | Freq: Every day | ORAL | 0 refills | Status: AC

## 2016-10-07 MED ORDER — PANTOPRAZOLE SODIUM 40 MG PO PACK
40.0000 mg | PACK | Freq: Every day | ORAL | Status: DC
  Administered 2016-10-07: 40 mg via ORAL
  Filled 2016-10-07: qty 20

## 2016-10-07 MED ORDER — DICYCLOMINE HCL 20 MG PO TABS: 20.0000 mg | ORAL_TABLET | Freq: Two times a day (BID) | ORAL | 0 refills | Status: DC

## 2016-10-07 MED ORDER — PROMETHAZINE HCL 25 MG PO TABS
25.0000 mg | ORAL_TABLET | Freq: Once | ORAL | Status: AC
  Administered 2016-10-07: 25 mg via ORAL
  Filled 2016-10-07: qty 1

## 2016-10-07 MED ORDER — DICYCLOMINE HCL 10 MG/ML IM SOLN
20.0000 mg | Freq: Once | INTRAMUSCULAR | Status: AC
  Administered 2016-10-07: 20 mg via INTRAMUSCULAR
  Filled 2016-10-07: qty 2

## 2016-10-07 MED ORDER — HEPARIN SOD (PORK) LOCK FLUSH 100 UNIT/ML IV SOLN
500.0000 [IU] | Freq: Once | INTRAVENOUS | Status: AC
  Administered 2016-10-07: 500 [IU]
  Filled 2016-10-07: qty 5

## 2016-10-07 MED ORDER — PROMETHAZINE HCL 25 MG PO TABS: 25.0000 mg | ORAL_TABLET | Freq: Four times a day (QID) | ORAL | 0 refills | Status: DC | PRN

## 2016-10-07 MED ORDER — PANTOPRAZOLE SODIUM 20 MG PO TBEC: 20.0000 mg | DELAYED_RELEASE_TABLET | Freq: Two times a day (BID) | ORAL | 0 refills | Status: DC

## 2016-10-07 NOTE — Discharge Instructions (Signed)
You will need another XRay in 3-4 weeks of your chest to make sure your pneumonia has resolved and that there are no signs of other problems (like malignancy.)

## 2016-10-07 NOTE — Progress Notes (Signed)
ED CM noted pt with CM consult with reason Admit Call [phone]  Spoke to [place] at [phone] who informs CM pt without pcp Cm discussed pt is verified in EPIC as Medicaid [place] access [facility] with [facility] ? [facility] whose [place] office may have closed but one open in [place] and pt needs to return or call DSS to have new medicaid pt reassigned if he can not see pcp in [place]  Will discuss this with pt

## 2016-10-07 NOTE — ED Triage Notes (Addendum)
Pt reports upper abd, chest, and neck pain. Hx of pancreatitis, feels like same. Began 3 days ago. Has not drank any alcohol.

## 2016-10-07 NOTE — ED Notes (Signed)
ED Provider at bedside. 

## 2016-10-07 NOTE — ED Provider Notes (Signed)
[facility] DEPT [facility] Provider Note   CSN: [id number] Arrival date & time: 10/07/16  0745     History   Chief Complaint Chief Complaint  Patient presents with  . Abdominal Pain    HPI Russell Williamson is a 61 y.o. male.  HPI   Can't see family doctor until 11th of this month, out of pain medications and can't wait that long.   Has history of chronic pancreatitis and having similar pain, but a little worse. Pain 8/10 epigastric, radiates to the chest. Chest pain burning. Uses inhaler for COPD and wondering if can have  New inhaler. Has been coughing up mucus for months.    Past Medical History:  Diagnosis Date  . Anemia   . Arthritis   . Cervical adenopathy 04/26/2012  . Chronic pancreatitis ([place])   . Constipation   . COPD (chronic obstructive pulmonary disease) ([place])   . Daily headache 05/23/2012   "small ones"  . Deficiency anemia 04/15/2016  . G tube feedings ([place])    has a feeding tube in stomach  . History of blood transfusion ~ 2004   "from the pancreatitis"  . History of radiation therapy 02/02/2011-03/22/2011   head/neck,left tonsil  . Hx of radiation therapy 02/02/11 to 03/22/11   L tonsil  . Hypertension   . Hypothyroidism 10/29/2013  . 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 Pancytopenia ([place]) 02/28/2014  . Poor venous access 04/28/2016  . 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 Thrush 11/02/2013  . 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 Urinary hesitancy     Patient Active Problem List   Diagnosis Date Noted  . 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 PICC (peripherally inserted central catheter) flush 05/07/2016  . Port catheter in place 05/05/2016  . Poor venous access 04/28/2016  . Weight loss 04/16/2016  . Deficiency anemia 04/15/2016  . Chronic leukopenia 04/19/2015  . Anemia in chronic illness 04/19/2015  . Weight loss, unintentional 04/19/2015  . Relapsing chronic pancreatitis ([place]) 01/17/2015  . Diarrhea 09/04/2014    . 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 Malnutrition of moderate degree ([place]) 09/03/2014  . Hypokalemia 09/02/2014  . Pancreatitis 09/01/2014  . Chronic alcoholic pancreatitis ([place]) 09/01/2014  . Mucositis 06/17/2014  . Preventive measure 06/17/2014  . Chronic neck pain 06/17/2014  . S/P gastrostomy ([place]) 06/17/2014  . Nicotine abuse 02/28/2014  . Pancytopenia ([place]) 02/28/2014  . Hypothyroidism (acquired) 11/02/2013  . Hypothyroidism 10/29/2013  . 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 Cervical adenopathy 04/26/2012  . Hiatal hernia   . Hx of radiation therapy   . HTN (hypertension) 10/16/2011  . History of cancer tonsil 08/20/2011  . Chronic pancreatitis [facility]     Past Surgical History:  Procedure Laterality Date  . BILE DUCT STENT PLACEMENT     hx of  . CHOLECYSTECTOMY  04/2005  . DIRECT LARYNGOSCOPY  05/23/2012   Procedure: DIRECT LARYNGOSCOPY;  Surgeon: Rozetta Nunnery, MD;  Location: [place];  Service: ENT;  Laterality: N/A;  . DIRECT LARYNGOSCOPY N/A 03/23/2013   Procedure: DIRECT LARYNGOSCOPY;  Surgeon: Rozetta Nunnery, MD;  Location: [place];  Service: ENT;  Laterality: N/A;  . ESOPHAGEAL DILATION N/A 03/23/2013   Procedure: ESOPHAGEAL DILATION;  Surgeon: Rozetta Nunnery, MD;  Location: [place];  Service: ENT;  Laterality: N/A;  . FRACTURE SURGERY    . IR GENERIC HISTORICAL  04/29/2016   IR US GUIDE VASC ACCESS RIGHT 04/29/2016 [facility]  .  IR GENERIC HISTORICAL  04/29/2016   IR FLUORO GUIDE CV LINE RIGHT 04/29/2016 [facility]  . IR GENERIC HISTORICAL  05/12/2016   IR US GUIDE VASC ACCESS LEFT 05/12/2016 [facility]  . IR GENERIC HISTORICAL  05/12/2016   IR FLUORO GUIDE CV LINE LEFT 05/12/2016 [facility]  . IR GENERIC HISTORICAL  06/28/2016   IR US GUIDE VASC ACCESS RIGHT 06/28/2016 Sandi Mariscal, MD [facility]  . IR GENERIC HISTORICAL  06/28/2016   IR FLUORO GUIDE PORT INSERTION RIGHT 06/28/2016 Sandi Mariscal, MD [facility]  . MASS BIOPSY   05/23/2012   Procedure: NECK MASS BIOPSY;  Surgeon: Rozetta Nunnery, MD;  Location: [place];  Service: ENT;  Laterality: N/A;  . RADICAL NECK DISSECTION  05/23/2012   w/mass excision  . RADICAL NECK DISSECTION  05/23/2012   Procedure: RADICAL NECK DISSECTION;  Surgeon: Rozetta Nunnery, MD;  Location: [place];  Service: ENT;  Laterality: Left;  . TIBIA FRACTURE SURGERY  1990's   left       Home Medications    Prior to Admission medications   Medication Sig Start Date End Date Taking? Authorizing Provider  doxylamine, Sleep, (UNISOM) 25 MG tablet Take 75 mg by mouth at bedtime as needed for sleep (sleep).    Yes Historical Provider, MD  folic acid (FOLVITE) 1 MG tablet Take 1 mg by mouth every morning.  05/14/15  Yes Historical Provider, MD  lidocaine-prilocaine (EMLA) cream Apply 1 application topically as needed. To port a cath site one hour before needle stick. 06/29/16  Yes Heath Lark, MD  lipase/protease/amylase (CREON-12/PANCREASE) 12000 UNITS CPEP capsule Take 1 capsule by mouth 4 (four) times daily.   Yes Historical Provider, MD  mirtazapine (REMERON) 30 MG tablet Take 30 mg by mouth at bedtime.   Yes Historical Provider, MD  Omega-3 Fatty Acids (FISH OIL PO) Take 1 capsule by mouth every morning.    Yes Historical Provider, MD  oxyCODONE (ROXICODONE) 15 MG immediate release tablet Take 15 mg by mouth every 4 (four) hours as needed for pain.  06/25/16  Yes Historical Provider, MD  PROAIR HFA 108 (90 Base) MCG/ACT inhaler Inhale 2 puff into lungs every 4-6 hours as needed for shortness of breath/wheezing 02/02/16  Yes Historical Provider, MD  promethazine (PHENERGAN) 25 MG tablet Take 1 tablet (25 mg total) by mouth every 6 (six) hours as needed for nausea. 05/25/16  Yes Heather Laisure, PA-C  traZODone (DESYREL) 100 MG tablet Take 100 mg by mouth at bedtime. 06/05/16  Yes Historical Provider, MD  dicyclomine (BENTYL) 20 MG tablet Take 1 tablet (20 mg total) by mouth 2 (two) times daily.  10/07/16   Gareth Morgan, MD  levofloxacin (LEVAQUIN) 500 MG tablet Take 1 tablet (500 mg total) by mouth daily. 10/07/16 10/14/16  Gareth Morgan, MD  ondansetron (ZOFRAN ODT) 8 MG disintegrating tablet 8mg  ODT q4 hours prn nausea Patient not taking: Reported on 10/07/2016 [id number]   Delora Fuel, MD  pantoprazole (PROTONIX) 20 MG tablet Take 1 tablet (20 mg total) by mouth 2 (two) times daily. 10/07/16   Gareth Morgan, MD  promethazine (PHENERGAN) 25 MG tablet Take 1 tablet (25 mg total) by mouth every 6 (six) hours as needed for nausea or vomiting. 10/07/16   Gareth Morgan, MD    Family History Family History  Problem Relation Age of Onset  . COPD Mother     Social History Social History  Substance Use Topics  . Smoking status: Current Every Day Smoker  Packs/day: 0.25    Years: 40.00    Types: Cigarettes  . Smokeless tobacco: Never Used     Comment: hx 1/2 -1 PPD  . Alcohol use No     Comment: 05/23/2012 hx alcohol abuse, quit ~ 2000     Allergies   Patient has no known allergies.   Review of Systems Review of Systems  Constitutional: Negative for fever.  HENT: Negative for sore throat.   Eyes: Negative for visual disturbance.  Respiratory: Positive for cough (chronic), shortness of breath (chronic) and wheezing (chronic).   Cardiovascular: Negative for chest pain.  Gastrointestinal: Positive for abdominal pain, diarrhea (2 days) and nausea. Negative for vomiting.  Genitourinary: Negative for difficulty urinating and dysuria.  Musculoskeletal: Positive for neck pain (when moving neck back and forth pain in front). Negative for back pain and neck stiffness.  Skin: Negative for rash.  Neurological: Positive for headaches. Negative for syncope.     Physical Exam Updated Vital Signs BP 154/87   Pulse (!) 57   Temp 98 F (36.7 C) (Oral)   Resp 16   SpO2 96%   Physical Exam  Constitutional: He is oriented to person, place, and time. He appears well-developed and  well-nourished. No distress.  HENT:  Head: Normocephalic and atraumatic.  Eyes: Conjunctivae and EOM are normal.  Neck: Normal range of motion.  Cardiovascular: Normal rate, regular rhythm, normal heart sounds and intact distal pulses.  Exam reveals no gallop and no friction rub.   No murmur heard. Pulmonary/Chest: Effort normal and breath sounds normal. No respiratory distress. He has no wheezes. He has no rales.  Abdominal: Soft. He exhibits no distension. There is tenderness (mild epigastric). There is no guarding.  Musculoskeletal: He exhibits no edema.  Neurological: He is alert and oriented to person, place, and time.  Skin: Skin is warm and dry. He is not diaphoretic.  Nursing note and vitals reviewed.    ED Treatments / Results  Labs (all labs ordered are listed, but only abnormal results are displayed) Labs Reviewed  COMPREHENSIVE METABOLIC PANEL - Abnormal; Notable for the following:       Result Value   Sodium 134 (*)    Chloride 99 (*)    Glucose, Bld 103 (*)    Creatinine, Ser 0.51 (*)    AST 46 (*)    All other components within normal limits  CBC - Abnormal; Notable for the following:    WBC 3.0 (*)    RBC 3.11 (*)    Hemoglobin 11.5 (*)    HCT 33.3 (*)    MCV 107.1 (*)    MCH 37.0 (*)    All other components within normal limits  LIPASE, BLOOD  I-STAT TROPOININ, ED    EKG  EKG Interpretation  Date/Time:  Thursday October 07 2016 08:09:44 EST Ventricular Rate:  85 PR Interval:    QRS Duration: 87 QT Interval:  399 QTC Calculation: 475 R Axis:   86 Text Interpretation:  Sinus rhythm Multiform ventricular premature complexes Anterior infarct, old PVC new, otherqise no significant changes Confirmed by [facility] MD, Torry Istre ([id number]) on 10/07/2016 10:46:49 AM       Radiology Dg Chest 2 View  Result Date: 10/07/2016 CLINICAL DATA:  Cough, chest congestion, mid chest neck and abdominal pain for several days. History of pancreatitis requiring bile duct  stenting EXAM: CHEST  2 VIEW COMPARISON:  Chest x-ray of May 25, 2016 FINDINGS: The lungs remain hyperinflated. There is stable scarring in the left  lower lobe posteriorly. New increased density in the anterior aspect of the right lower lobe. The heart and pulmonary vascularity are normal. The mediastinum is normal in width. There is no pleural effusion or pneumothorax. The bony thorax is unremarkable. IMPRESSION: COPD. Stable scarring at the left lung base posteriorly. Atelectasis or early pneumonia anterior medially in the right lower lobe. Followup PA and lateral chest X-ray is recommended in 3-4 weeks following trial of antibiotic therapy to ensure resolution and exclude underlying malignancy. Electronically Signed   By: David  Martinique M.D.   On: 10/07/2016 09:14    Procedures Procedures (including critical care time)  Medications Ordered in ED Medications  dicyclomine (BENTYL) injection 20 mg (20 mg Intramuscular Given 10/07/16 0935)  oxyCODONE (ROXICODONE) 5 MG/5ML solution 10 mg (10 mg Oral Given 10/07/16 0937)  promethazine (PHENERGAN) tablet 25 mg (25 mg Oral Given 10/07/16 0936)  albuterol (PROVENTIL HFA;VENTOLIN HFA) 108 (90 Base) MCG/ACT inhaler 2 puff (2 puffs Inhalation Given 10/07/16 0936)  heparin lock flush 100 unit/mL (500 Units Intracatheter Given 10/07/16 1132)     Initial Impression / Assessment and Plan / ED Course  I have reviewed the triage vital signs and the nursing notes.  Pertinent labs & imaging results that were available during my care of the patient were reviewed by me and considered in my medical decision making (see chart for details).  Clinical Course    61yo male With history of tonsillar cancer, malnutrition, chronic pancreatitis presents with concern for epigastric abdominal pain with radiation to the chest. Patient reports that the pain is similar to prior pancreatitis, has a benign abdominal exam, and have low suspicion for acute cholecystitis, obstruction,  perforation. Lipase within normal limits. Patient's abdominal pain is likely related to chronic pancreatitis, however we'll also treat him with gastritis with a twice a day PPI.  Patient also reported pain radiated to his chest. Doubt PE. Constant pain over more than 6hr with negative troponin and doubt ACS.  Chest x-ray does show concern for possible pneumonia. Patient does report he's had a cough for several months, and given symptoms of chest pain/cough will cover him for pneumonia with levaquin and recommend repeat XR in 3-4 weeks with PCP for resolution. Case manager provided patient with resources to find a primary care physician.  Patient was given Percocet in the ED, however discussed that M unable to provide narcotics for his chronic pain. Pt tolerating po in ED with pain improved. Pt reports some anterior neck pain with movements. Do not see signs of acute infection on exam, recommend oncology follow up given hx of tonsillar cancer. Gave him a prescription for Bentyl, phenergan and protonix. Rec follow up with PCP/GI/oncology. Patient discharged in stable condition with understanding of reasons to return.   Final Clinical Impressions(s) / ED Diagnoses   Final diagnoses:  Epigastric abdominal pain  Community acquired pneumonia of right lower lobe of lung ([place])  Chronic pancreatitis, unspecified pancreatitis type [facility])    New Prescriptions Discharge Medication List as of 10/07/2016 11:10 AM    START taking these medications   Details  dicyclomine (BENTYL) 20 MG tablet Take 1 tablet (20 mg total) by mouth 2 (two) times daily., Starting Thu 10/07/2016, Print    levofloxacin (LEVAQUIN) 500 MG tablet Take 1 tablet (500 mg total) by mouth daily., Starting Thu 10/07/2016, Until Thu 10/14/2016, Print    pantoprazole (PROTONIX) 20 MG tablet Take 1 tablet (20 mg total) by mouth 2 (two) times daily., Starting Thu 10/07/2016, Print    !!  promethazine (PHENERGAN) 25 MG tablet Take 1 tablet (25 mg total) by  mouth every 6 (six) hours as needed for nausea or vomiting., Starting Thu 10/07/2016, Print     !! - Potential duplicate medications found. Please discuss with provider.       Gareth Morgan, MD 10/08/16 [phone]

## 2016-10-14 ENCOUNTER — Ambulatory Visit: Payer: Self-pay | Admitting: Physician Assistant

## 2016-10-14 ENCOUNTER — Telehealth: Payer: Self-pay | Admitting: Physician Assistant

## 2016-10-15 NOTE — Telephone Encounter (Signed)
No charge. 

## 2016-10-20 ENCOUNTER — Emergency Department (HOSPITAL_COMMUNITY)
Admission: EM | Admit: 2016-10-20 | Discharge: 2016-10-20 | Disposition: A | Discharge status: Home / Self Care | Payer: Medicaid Other | Attending: Emergency Medicine | Admitting: Emergency Medicine

## 2016-10-20 ENCOUNTER — Encounter (HOSPITAL_COMMUNITY): Payer: Self-pay | Admitting: Emergency Medicine

## 2016-10-20 DIAGNOSIS — G8929 Other chronic pain: Secondary | ICD-10-CM | POA: Diagnosis not present

## 2016-10-20 DIAGNOSIS — J449 Chronic obstructive pulmonary disease, unspecified: Secondary | ICD-10-CM | POA: Diagnosis not present

## 2016-10-20 DIAGNOSIS — E039 Hypothyroidism, unspecified: Secondary | ICD-10-CM | POA: Insufficient documentation

## 2016-10-20 DIAGNOSIS — Z85818 Personal history of malignant neoplasm of other sites of lip, oral cavity, and pharynx: Secondary | ICD-10-CM | POA: Insufficient documentation

## 2016-10-20 DIAGNOSIS — R1013 Epigastric pain: Secondary | ICD-10-CM | POA: Diagnosis not present

## 2016-10-20 DIAGNOSIS — I10 Essential (primary) hypertension: Secondary | ICD-10-CM | POA: Diagnosis not present

## 2016-10-20 DIAGNOSIS — Z79899 Other long term (current) drug therapy: Secondary | ICD-10-CM | POA: Diagnosis not present

## 2016-10-20 DIAGNOSIS — F17210 Nicotine dependence, cigarettes, uncomplicated: Secondary | ICD-10-CM | POA: Diagnosis not present

## 2016-10-20 LAB — URINALYSIS, ROUTINE W REFLEX MICROSCOPIC
Bilirubin Urine: NEGATIVE
Glucose, UA: NEGATIVE mg/dL
Hgb urine dipstick: NEGATIVE
KETONES UR: NEGATIVE mg/dL
LEUKOCYTES UA: NEGATIVE
Nitrite: NEGATIVE
PROTEIN: NEGATIVE mg/dL
Specific Gravity, Urine: 1.012 (ref 1.005–1.030)
pH: 8 (ref 5.0–8.0)

## 2016-10-20 LAB — CBC
HCT: 33.6 % — ABNORMAL LOW (ref 39.0–52.0)
Hemoglobin: 11.8 g/dL — ABNORMAL LOW (ref 13.0–17.0)
MCH: 36.8 pg — ABNORMAL HIGH (ref 26.0–34.0)
MCHC: 35.1 g/dL (ref 30.0–36.0)
MCV: 104.7 fL — ABNORMAL HIGH (ref 78.0–100.0)
Platelets: 204 10*3/uL (ref 150–400)
RBC: 3.21 MIL/uL — AB (ref 4.22–5.81)
RDW: 15.1 % (ref 11.5–15.5)
WBC: 4 10*3/uL (ref 4.0–10.5)

## 2016-10-20 LAB — COMPREHENSIVE METABOLIC PANEL
ALT: 32 U/L (ref 17–63)
AST: 31 U/L (ref 15–41)
Albumin: 3.9 g/dL (ref 3.5–5.0)
Alkaline Phosphatase: 75 U/L (ref 38–126)
Anion gap: 9 (ref 5–15)
BUN: 14 mg/dL (ref 6–20)
CHLORIDE: 96 mmol/L — AB (ref 101–111)
CO2: 28 mmol/L (ref 22–32)
CREATININE: 0.36 mg/dL — AB (ref 0.61–1.24)
Calcium: 8.7 mg/dL — ABNORMAL LOW (ref 8.9–10.3)
GFR calc non Af Amer: >60 mL/min (ref >60)
Glucose, Bld: 90 mg/dL (ref 65–99)
POTASSIUM: 4 mmol/L (ref 3.5–5.1)
SODIUM: 133 mmol/L — AB (ref 135–145)
Total Bilirubin: 0.4 mg/dL (ref 0.3–1.2)
Total Protein: 7.2 g/dL (ref 6.5–8.1)

## 2016-10-20 LAB — LIPASE, BLOOD: LIPASE: 23 U/L (ref 11–51)

## 2016-10-20 MED ORDER — FAMOTIDINE IN NACL 20-0.9 MG/50ML-% IV SOLN
20.0000 mg | Freq: Once | INTRAVENOUS | Status: AC
  Administered 2016-10-20: 20 mg via INTRAVENOUS
  Filled 2016-10-20: qty 50

## 2016-10-20 MED ORDER — HEPARIN SOD (PORK) LOCK FLUSH 100 UNIT/ML IV SOLN
500.0000 [IU] | Freq: Once | INTRAVENOUS | Status: AC
  Administered 2016-10-20: 500 [IU]
  Filled 2016-10-20: qty 5

## 2016-10-20 MED ORDER — HYDROCODONE-ACETAMINOPHEN 7.5-325 MG/15ML PO SOLN
15.0000 mL | Freq: Once | ORAL | Status: AC
  Administered 2016-10-20: 15 mL via ORAL
  Filled 2016-10-20: qty 15

## 2016-10-20 MED ORDER — SODIUM CHLORIDE 0.9 % IV BOLUS (SEPSIS)
1000.0000 mL | INTRAVENOUS | Status: AC
  Administered 2016-10-20: 1000 mL via INTRAVENOUS

## 2016-10-20 MED ORDER — KETOROLAC TROMETHAMINE 30 MG/ML IJ SOLN
30.0000 mg | Freq: Once | INTRAMUSCULAR | Status: AC
  Administered 2016-10-20: 30 mg via INTRAVENOUS
  Filled 2016-10-20: qty 1

## 2016-10-20 NOTE — ED Triage Notes (Addendum)
Pt c/o abdominal pain associated with pancreatitis that has been going on for several weeks; also reports neck pain; endorses nausea; cites 1 episode of diarrhea today  Pt added that he cannot afford the bills he gets from the pain clinic so he has stopped going

## 2016-10-20 NOTE — ED Provider Notes (Signed)
[facility] DEPT Provider Note   CSN: [id number] Arrival date & time: 10/20/16  0045     History   Chief Complaint Chief Complaint  Patient presents with  . Abdominal Pain    HPI Jamiyl Flight is a 61 y.o. male.  61 year old male with a history of arthritis, chronic pancreatitis, COPD, hypertension, hypothyroid, and pancytopenia presents to the emergency department for evaluation of abdominal pain. He reports worsening abdominal pain over the last few days. He states that his pain is sharp and burning. Patient states that he will sometimes take Tylenol for symptoms, but this usually provides little relief. He was previously followed by pain management, but has neglected follow-up over the past 3-4 years secondary to cost. He has not had any vomiting, though he does endorse nausea. He states one episode of diarrhea today. No recent fevers.   The history is provided by the patient. No language interpreter was used.  Abdominal Pain      Past Medical History:  Diagnosis Date  . Anemia   . Arthritis   . Cervical adenopathy 04/26/2012  . Chronic pancreatitis ([place])   . Constipation   . COPD (chronic obstructive pulmonary disease) ([place])   . Daily headache 05/23/2012   "small ones"  . Deficiency anemia 04/15/2016  . G tube feedings ([place])    has a feeding tube in stomach  . History of blood transfusion ~ 2004   "from the pancreatitis"  . History of radiation therapy 02/02/2011-03/22/2011   head/neck,left tonsil  . Hx of radiation therapy 02/02/11 to 03/22/11   L tonsil  . Hypertension   . Hypothyroidism 10/29/2013  . 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 Pancytopenia ([place]) 02/28/2014  . Poor venous access 04/28/2016  . 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 Thrush 11/02/2013  . 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 Urinary hesitancy     Patient Active Problem List   Diagnosis Date Noted  . 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 PICC  (peripherally inserted central catheter) flush 05/07/2016  . Port catheter in place 05/05/2016  . Poor venous access 04/28/2016  . Weight loss 04/16/2016  . Deficiency anemia 04/15/2016  . Chronic leukopenia 04/19/2015  . Anemia in chronic illness 04/19/2015  . Weight loss, unintentional 04/19/2015  . Relapsing chronic pancreatitis ([place]) 01/17/2015  . Diarrhea 09/04/2014  . 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 Malnutrition of moderate degree ([place]) 09/03/2014  . Hypokalemia 09/02/2014  . Pancreatitis 09/01/2014  . Chronic alcoholic pancreatitis ([place]) 09/01/2014  . Mucositis 06/17/2014  . Preventive measure 06/17/2014  . Chronic neck pain 06/17/2014  . S/P gastrostomy ([place]) 06/17/2014  . Nicotine abuse 02/28/2014  . Pancytopenia ([place]) 02/28/2014  . Hypothyroidism (acquired) 11/02/2013  . Hypothyroidism 10/29/2013  . 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 Cervical adenopathy 04/26/2012  . Hiatal hernia   . Hx of radiation therapy   . HTN (hypertension) 10/16/2011  . History of cancer tonsil 08/20/2011  . Chronic pancreatitis [facility])     Past Surgical History:  Procedure Laterality Date  . BILE DUCT STENT PLACEMENT     hx of  . CHOLECYSTECTOMY  04/2005  . DIRECT LARYNGOSCOPY  05/23/2012   Procedure: DIRECT LARYNGOSCOPY;  Surgeon: Rozetta Nunnery, MD;  Location: [facility];  Service: ENT;  Laterality: N/A;  . DIRECT LARYNGOSCOPY N/A 03/23/2013   Procedure: DIRECT LARYNGOSCOPY;  Surgeon: Rozetta Nunnery, MD;  Location: [facility];  Service: ENT;  Laterality: N/A;  . ESOPHAGEAL  DILATION N/A 03/23/2013   Procedure: ESOPHAGEAL DILATION;  Surgeon: Rozetta Nunnery, MD;  Location: [facility];  Service: ENT;  Laterality: N/A;  . FRACTURE SURGERY    . IR GENERIC HISTORICAL  04/29/2016   IR US GUIDE VASC ACCESS RIGHT 04/29/2016 [facility]  . IR GENERIC HISTORICAL  04/29/2016   IR FLUORO GUIDE CV LINE RIGHT 04/29/2016 [facility]  . IR GENERIC HISTORICAL  05/12/2016   IR US  GUIDE VASC ACCESS LEFT 05/12/2016 [facility]  . IR GENERIC HISTORICAL  05/12/2016   IR FLUORO GUIDE CV LINE LEFT 05/12/2016 [facility]  . IR GENERIC HISTORICAL  06/28/2016   IR US GUIDE VASC ACCESS RIGHT 06/28/2016 Sandi Mariscal, MD [facility]  . IR GENERIC HISTORICAL  06/28/2016   IR FLUORO GUIDE PORT INSERTION RIGHT 06/28/2016 Sandi Mariscal, MD [facility]  . MASS BIOPSY  05/23/2012   Procedure: NECK MASS BIOPSY;  Surgeon: Rozetta Nunnery, MD;  Location: [facility];  Service: ENT;  Laterality: N/A;  . RADICAL NECK DISSECTION  05/23/2012   w/mass excision  . RADICAL NECK DISSECTION  05/23/2012   Procedure: RADICAL NECK DISSECTION;  Surgeon: Rozetta Nunnery, MD;  Location: [facility];  Service: ENT;  Laterality: Left;  . TIBIA FRACTURE SURGERY  1990's   left       Home Medications    Prior to Admission medications   Medication Sig Start Date End Date Taking? Authorizing Provider  dicyclomine (BENTYL) 20 MG tablet Take 1 tablet (20 mg total) by mouth 2 (two) times daily. 10/07/16  Yes Gareth Morgan, MD  doxylamine, Sleep, (UNISOM) 25 MG tablet Take 75 mg by mouth at bedtime as needed for sleep (sleep).    Yes Historical Provider, MD  folic acid (FOLVITE) 1 MG tablet Take 1 mg by mouth every morning.  05/14/15  Yes Historical Provider, MD  lidocaine-prilocaine (EMLA) cream Apply 1 application topically as needed. To port a cath site one hour before needle stick. 06/29/16  Yes Heath Lark, MD  lipase/protease/amylase (CREON-12/PANCREASE) 12000 UNITS CPEP capsule Take 1 capsule by mouth 4 (four) times daily.   Yes Historical Provider, MD  mirtazapine (REMERON) 30 MG tablet Take 30 mg by mouth at bedtime.   Yes Historical Provider, MD  Omega-3 Fatty Acids (FISH OIL PO) Take 1 capsule by mouth every morning.    Yes Historical Provider, MD  oxyCODONE (ROXICODONE) 15 MG immediate release tablet Take 15 mg by mouth every 4 (four) hours as needed for pain.  06/25/16  Yes Historical Provider, MD    pantoprazole (PROTONIX) 20 MG tablet Take 1 tablet (20 mg total) by mouth 2 (two) times daily. 10/07/16  Yes Gareth Morgan, MD  PROAIR HFA 108 [phone] Base) MCG/ACT inhaler Inhale 2 puff into lungs every 4-6 hours as needed for shortness of breath/wheezing 02/02/16  Yes Historical Provider, MD  promethazine (PHENERGAN) 25 MG tablet Take 1 tablet (25 mg total) by mouth every 6 (six) hours as needed for nausea or vomiting. 10/07/16  Yes Gareth Morgan, MD  traZODone (DESYREL) 100 MG tablet Take 100 mg by mouth at bedtime. 06/05/16  Yes Historical Provider, MD  ondansetron (ZOFRAN ODT) 8 MG disintegrating tablet 8mg  ODT q4 hours prn nausea Patient not taking: Reported on 10/07/2016 [id number]   Delora Fuel, MD  promethazine (PHENERGAN) 25 MG tablet Take 1 tablet (25 mg total) by mouth every 6 (six) hours as needed for nausea. Patient not taking: Reported on 10/20/2016 05/25/16   Hyman Bible,  PA-C    Family History Family History  Problem Relation Age of Onset  . COPD Mother     Social History Social History  Substance Use Topics  . Smoking status: Current Every Day Smoker    Packs/day: 0.25    Years: 40.00    Types: Cigarettes  . Smokeless tobacco: Never Used     Comment: hx 1/2 -1 PPD  . Alcohol use No     Comment: 05/23/2012 hx alcohol abuse, quit ~ 2000     Allergies   Patient has no known allergies.   Review of Systems Review of Systems  Gastrointestinal: Positive for abdominal pain.  Ten systems reviewed and are negative for acute change, except as noted in the HPI.    Physical Exam Updated Vital Signs BP 174/97 (BP Location: Left Arm)   Pulse 71   Temp 98.1 F (36.7 C) (Oral)   Resp 18   Ht 5\' 6"  (1.676 m)   Wt 45.4 kg   SpO2 100%   BMI 16.14 kg/m   Physical Exam  Constitutional: He is oriented to person, place, and time. He appears well-developed and well-nourished. No distress.  Chronically thin/frail-appearing  HENT:  Head: Normocephalic and atraumatic.  Eyes:  Conjunctivae and EOM are normal. No scleral icterus.  Neck: Normal range of motion.  Cardiovascular: Normal rate, regular rhythm and intact distal pulses.   Pulmonary/Chest: Effort normal. No respiratory distress. He has no wheezes.  Respirations even and unlabored  Abdominal: Soft. He exhibits no distension and no mass. There is tenderness. There is no guarding.  Epigastric, right upper quadrant, and left upper quadrant tenderness. No peritoneal signs or distention. No masses.  Musculoskeletal: Normal range of motion.  Neurological: He is alert and oriented to person, place, and time. He exhibits normal muscle tone. Coordination normal.  GCS 15. Patient moving all extremities.  Skin: Skin is warm and dry. No rash noted. He is not diaphoretic. No erythema. No pallor.  Psychiatric: He has a normal mood and affect. His behavior is normal.  Nursing note and vitals reviewed.    ED Treatments / Results  Labs (all labs ordered are listed, but only abnormal results are displayed) Labs Reviewed  CBC - Abnormal; Notable for the following:       Result Value   RBC 3.21 (*)    Hemoglobin 11.8 (*)    HCT 33.6 (*)    MCV 104.7 (*)    MCH 36.8 (*)    All other components within normal limits  URINALYSIS, ROUTINE W REFLEX MICROSCOPIC  LIPASE, BLOOD  COMPREHENSIVE METABOLIC PANEL    EKG  EKG Interpretation None       Radiology No results found.  Procedures Procedures (including critical care time)  Medications Ordered in ED Medications  sodium chloride 0.9 % bolus 1,000 mL (1,000 mLs Intravenous New Bag/Given 10/20/16 0519)  famotidine (PEPCID) IVPB 20 mg premix (20 mg Intravenous New Bag/Given 10/20/16 0520)  ketorolac (TORADOL) 30 MG/ML injection 30 mg (30 mg Intravenous Given 10/20/16 0520)     Initial Impression / Assessment and Plan / ED Course  I have reviewed the triage vital signs and the nursing notes.  Pertinent labs & imaging results that were available during my care  of the patient were reviewed by me and considered in my medical decision making (see chart for details).  Clinical Course     Patient presenting for abdominal pain. He has a history of pancreatitis. He reports that his pain feels similar.  No leukocytosis or fever. Will manage symptoms with Toradol, Pepcid, and fluids. Remainder of laboratory workup is pending. Patient signed out to [facility], PA-C at change of shift who will follow-up on blood work and disposition appropriately.   Final Clinical Impressions(s) / ED Diagnoses   Final diagnoses:  Epigastric pain    New Prescriptions New Prescriptions   No medications on file     Antonietta Breach, PA-C 10/20/16 [id number]    Veryl Speak, MD 10/20/16 [phone]

## 2016-10-20 NOTE — Discharge Instructions (Signed)
Read the information below.  You may return to the Emergency Department at any time for worsening condition or any new symptoms that concern you.  If you develop high fevers, worsening abdominal pain, uncontrolled vomiting, or are unable to tolerate fluids by mouth, return to the ER for a recheck.   °

## 2016-10-20 NOTE — ED Provider Notes (Signed)
6:04 AM Patient signed out to me at change of shift from [facility], [place].  Patient with hx chronic pancreatitis, presents with same.  Labs pending. Plan for pain control and PO trial.  Anticipate discharge home.    [place] checked.  Pt was receiving regular monthly prescriptions of narcotic (oxycodone 15mg  or Hydromorphone 8mg ) and temazepam , last filled Sep 10, 2016.  6:39 AM Pt reports he is feeling better, requests something warm to drink.  Abdominal exam is benign, mild epigastric tenderness, no guarding or rebound.    7:33 AM Pt reports continued improvement. Tolerated PO.  Requesting pain medication for his chronic pain prior to discharge.  He is unable to pay $200 to see his PCP York Ram any more - this is who prescribed his medications.  Pt has been out of his medicine for several weeks.    Doubt benzo withdrawal.  Pt likely with chronic pain issues that are currently untreated.  Pt d/c with resource guides.  PO pain medication given prior to d/c.  No prescriptions.     [place], PA-C 10/20/16 [place], MD 10/21/16 [phone]

## 2016-10-20 NOTE — ED Notes (Signed)
Bed: WTR6 Expected date:  Expected time:  Means of arrival:  Comments: 

## 2016-10-22 ENCOUNTER — Encounter: Payer: Self-pay | Admitting: Physician Assistant

## 2016-10-22 ENCOUNTER — Ambulatory Visit (INDEPENDENT_AMBULATORY_CARE_PROVIDER_SITE_OTHER): Payer: Medicaid Other | Admitting: Physician Assistant

## 2016-10-22 VITALS — BP 126/74 | HR 74 | Ht 66.0 in | Wt 98.4 lb

## 2016-10-22 DIAGNOSIS — R12 Heartburn: Secondary | ICD-10-CM | POA: Diagnosis not present

## 2016-10-22 DIAGNOSIS — R1013 Epigastric pain: Secondary | ICD-10-CM

## 2016-10-22 DIAGNOSIS — Z1211 Encounter for screening for malignant neoplasm of colon: Secondary | ICD-10-CM | POA: Diagnosis not present

## 2016-10-22 DIAGNOSIS — K860 Alcohol-induced chronic pancreatitis: Secondary | ICD-10-CM | POA: Diagnosis not present

## 2016-10-22 MED ORDER — TRAMADOL HCL 50 MG PO TABS: 50.0000 mg | ORAL_TABLET | Freq: Four times a day (QID) | ORAL | 0 refills | Status: DC | PRN

## 2016-10-22 NOTE — Progress Notes (Signed)
Reviewed and agree with initial management plan.  Rojelio Uhrich T. Jereld Presti, MD FACG 

## 2016-10-22 NOTE — Patient Instructions (Signed)
You have been scheduled for an endoscopy and colonoscopy. Please follow the written instructions given to you at your visit today. Please pick up your prep supplies at the pharmacy within the next 1-3 days. If you use inhalers (even only as needed), please bring them with you on the day of your procedure. Your physician has requested that you go to www.startemmi.com and enter the access code given to you at your visit today. This web site gives a general overview about your procedure. However, you should still follow specific instructions given to you by our office regarding your preparation for the procedure.  We have sent the following medications to your pharmacy for you to pick up at your convenience: Tramadol 50 mg   We are referring you to the pain clinic. We will call you with an appointment.

## 2016-10-22 NOTE — Progress Notes (Signed)
Chief Complaint: Epigastric Pain, Chronic Pancreatitis  HPI:  Russell Williamson is a 61 year old African-American male with past medical history of anemia, arthritis, chronic pancreatitis, constipation, COPD, malignant neoplasm of the neck, status post radiation, hypertension and seizures, who was referred to me by Dr. Heath Lark for complaint of chronic pancreatitis.   Per review of patient's chart, he has been seen in the ED twice already this month for epigastric abdominal pain and was given pain medications. Labs on his first visit 10/07/16 showed a low white count at 3 and otherwise normal CBC. CMP showed sodium of 134 and AST of 46. Patient had a chest x-ray on that date which showed stable COPD. There was also question of atelectasis or early pneumonia in the anterior right lower lobe. Given Percocet. He was again seen on 10/20/16 and again received pain medication. He did not have any imaging at that time. CBC and CMP were unremarkable compared with previous visit.   Today, the patient tells me that he used to follow with the pain clinic for management of his narcotics but was told he had a large bill with them and could not go back. He also tells me that currently he does not have a PCP, as his PCP used to give him pain meds as well, but apparently moved to [place]. It does not sound as the patient has a stable home life and he tells me that he "doesn't have any money", so he is not on many of the medications listed. He denies using Pantoprazole currently. When we discussed patient's pain he describes an "achy burning" epigastric pain which "feels like the pancreatitis I had before". He tells me he was first diagnosed in 2006/7 with pancreatitis and had a large workup at that time, including "surgery on my pancreas", ever since then he has "continued with pain". This has been worse obviously since the patient has been off of his pain medication recently. Patient tells me initially his pancreatitis was  thought due to his alcohol use but he has not "drank a drop" since he was diagnosed. Patient tells me that this pain is a 6/10 currently and can radiate in intensity up to a 10 /10. He tells me that immediately after eating at least a couple of minutes he has increased intensity of this pain. He describes that "since I had surgery for cancer", he has to eat mostly liquids. He also describes daily heartburn and has not been on his medication for this either. Associated symptoms include some nausea as well as occasional "very dark" stools per the patient.   Patient does verbalize some worry regarding cancer as he has had cancer in the past. He denies ever having a screening colonoscopy.   Patient denies fever, chills, bright red blood in his stool, current weight loss, fatigue, change in bowel habits, vomiting, dysphagia or symptoms that awaken him at night.  Past Medical History:  Diagnosis Date  . Anemia   . Arthritis   . Cervical adenopathy 04/26/2012  . Chronic pancreatitis ([place])   . Constipation   . COPD (chronic obstructive pulmonary disease) ([place])   . Daily headache 05/23/2012   "small ones"  . Deficiency anemia 04/15/2016  . G tube feedings ([place])    has a feeding tube in stomach  . History of blood transfusion ~ 2004   "from the pancreatitis"  . History of radiation therapy 02/02/2011-03/22/2011   head/neck,left tonsil  . Hx of radiation therapy 02/02/11 to 03/22/11   L  tonsil  . Hypertension   . Hypothyroidism 10/29/2013  . 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 Neck malignant neoplasm ([place]) 05/23/2012  . Pancytopenia ([place]) 02/28/2014  . Poor venous access 04/28/2016  . Seizures ([place])    alcohol-related  . Thrush 11/02/2013  . 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 Urinary hesitancy     Past Surgical History:  Procedure Laterality Date  . BILE DUCT STENT PLACEMENT     hx of  . CHOLECYSTECTOMY  04/2005  . DIRECT LARYNGOSCOPY  05/23/2012   Procedure: DIRECT LARYNGOSCOPY;  Surgeon: Rozetta Nunnery, MD;  Location: [facility];  Service: ENT;  Laterality: N/A;  . DIRECT LARYNGOSCOPY N/A 03/23/2013   Procedure: DIRECT LARYNGOSCOPY;  Surgeon: Rozetta Nunnery, MD;  Location: [facility];  Service: ENT;  Laterality: N/A;  . ESOPHAGEAL DILATION N/A 03/23/2013   Procedure: ESOPHAGEAL DILATION;  Surgeon: Rozetta Nunnery, MD;  Location: [facility];  Service: ENT;  Laterality: N/A;  . FRACTURE SURGERY    . IR GENERIC HISTORICAL  04/29/2016   IR US GUIDE VASC ACCESS RIGHT 04/29/2016 [facility]  . IR GENERIC HISTORICAL  04/29/2016   IR FLUORO GUIDE CV LINE RIGHT 04/29/2016 [facility]  . IR GENERIC HISTORICAL  05/12/2016   IR US GUIDE VASC ACCESS LEFT 05/12/2016 [facility]  . IR GENERIC HISTORICAL  05/12/2016   IR FLUORO GUIDE CV LINE LEFT 05/12/2016 [facility]  . IR GENERIC HISTORICAL  06/28/2016   IR US GUIDE VASC ACCESS RIGHT 06/28/2016 Sandi Mariscal, MD [facility]  . IR GENERIC HISTORICAL  06/28/2016   IR FLUORO GUIDE PORT INSERTION RIGHT 06/28/2016 Sandi Mariscal, MD [facility]  . MASS BIOPSY  05/23/2012   Procedure: NECK MASS BIOPSY;  Surgeon: Rozetta Nunnery, MD;  Location: [facility];  Service: ENT;  Laterality: N/A;  . RADICAL NECK DISSECTION  05/23/2012   w/mass excision  . RADICAL NECK DISSECTION  05/23/2012   Procedure: RADICAL NECK DISSECTION;  Surgeon: Rozetta Nunnery, MD;  Location: [facility];  Service: ENT;  Laterality: Left;  . TIBIA FRACTURE SURGERY  1990's   left    Current Outpatient Prescriptions  Medication Sig Dispense Refill  . dicyclomine (BENTYL) 20 MG tablet Take 1 tablet (20 mg total) by mouth 2 (two) times daily. 20 tablet 0  . doxylamine, Sleep, (UNISOM) 25 MG tablet Take 75 mg by mouth at bedtime as needed for sleep (sleep).     . folic acid (FOLVITE) 1 MG tablet Take 1 mg by mouth every morning.   5  . lidocaine-prilocaine (EMLA) cream Apply 1 application topically as needed. To port a cath site one hour before needle stick.  30 g 3  . lipase/protease/amylase (CREON-12/PANCREASE) 12000 UNITS CPEP capsule Take 1 capsule by mouth 4 (four) times daily.    . mirtazapine (REMERON) 30 MG tablet Take 30 mg by mouth at bedtime.    . Omega-3 Fatty Acids (FISH OIL PO) Take 1 capsule by mouth every morning.     . ondansetron (ZOFRAN ODT) 8 MG disintegrating tablet 8mg  ODT q4 hours prn nausea 4 tablet 0  . oxyCODONE (ROXICODONE) 15 MG immediate release tablet Take 15 mg by mouth every 4 (four) hours as needed for pain.   0  . pantoprazole (PROTONIX) 20 MG tablet Take 1 tablet (20 mg total) by mouth 2 (two) times daily. 30 tablet 0  . PROAIR HFA 108 (90 Base) MCG/ACT inhaler Inhale 2 puff into lungs  every 4-6 hours as needed for shortness of breath/wheezing  5  . promethazine (PHENERGAN) 25 MG tablet Take 1 tablet (25 mg total) by mouth every 6 (six) hours as needed for nausea. 20 tablet 0  . promethazine (PHENERGAN) 25 MG tablet Take 1 tablet (25 mg total) by mouth every 6 (six) hours as needed for nausea or vomiting. 20 tablet 0  . traZODone (DESYREL) 100 MG tablet Take 100 mg by mouth at bedtime.  6  . traMADol (ULTRAM) 50 MG tablet Take 1 tablet (50 mg total) by mouth every 6 (six) hours as needed. 20 tablet 0   No current facility-administered medications for this visit.     Allergies as of 10/22/2016  . (No Known Allergies)    Family History  Problem Relation Age of Onset  . COPD Mother     Social History   Social History  . Marital status: Single    Spouse name: N/A  . Number of children: N/A  . Years of education: N/A   Occupational History  . Not on file.   Social History Main Topics  . Smoking status: Current Every Day Smoker    Packs/day: 0.25    Years: 40.00    Types: Cigarettes  . Smokeless tobacco: Never Used     Comment: hx 1/2 -1 PPD  . Alcohol use No     Comment: 05/23/2012 hx alcohol abuse, quit ~ 2000  . Drug use: Yes    Types: Marijuana     Comment: 05/23/2012 "used marijuana in my  teens"  . Sexual activity: No   Other Topics Concern  . Not on file   Social History Narrative  . No narrative on file    Review of Systems:    Constitutional: No weight loss, fever or chills HEENT: Eyes: No change in vision               Ears, Nose, Throat:  No change in hearing  Cardiovascular: No chest pain Respiratory: Positive for cough No SOB Gastrointestinal: See HPI and otherwise negative Genitourinary: No dysuria or change in urinary frequency Neurological: No headache Musculoskeletal: No new muscle or joint pain Hematologic: No bleeding Psychiatric: No history of depression or anxiety   Physical Exam:  Vital signs: BP 126/74   Pulse 74   Ht 5\' 6"  (1.676 m)   Wt 98 lb 6 oz (44.6 kg)   BMI 15.88 kg/m   Constitutional:   Pleasant Chronically ill, thin appearing African-American male appears to be in NAD, Well developed, Well nourished, alert and cooperative Head:  Normocephalic and atraumatic. Eyes:   PEERL, EOMI. No icterus. Conjunctiva pink. Ears:  Normal auditory acuity. Neck:  Supple Throat: Oral cavity and pharynx without inflammation, swelling or lesion.  Respiratory: Respirations even and unlabored. Wheezing bilateral posterior lung fields Cardiovascular: Normal S1, S2. No MRG. Regular rate and rhythm. No peripheral edema, cyanosis or pallor.  Gastrointestinal:  Soft, nondistended, moderate epigastric and right upper quadrant tenderness No rebound or guarding. Normal bowel sounds. No appreciable masses or hepatomegaly. Visible midline surgical scar Rectal:  Not performed.  Msk:  Symmetrical without gross deformities. Without edema, no deformity or joint abnormality.  Neurologic:  Alert and  oriented x4;  grossly normal neurologically.  Skin:   Dry and intact without significant lesions or rashes. Psychiatric: Demonstrates good judgement and reason without abnormal affect or behaviors.  RELEVANT LABS AND IMAGING: CBC    Component Value Date/Time   WBC  4.0 10/20/2016 0505  RBC 3.21 (L) 10/20/2016 0505   HGB 11.8 (L) 10/20/2016 0505   HGB 9.5 (L) 08/12/2016 1210   HCT 33.6 (L) 10/20/2016 0505   HCT 28.4 (L) 08/12/2016 1210   PLT 204 10/20/2016 0505   PLT 153 08/12/2016 1210   MCV 104.7 (H) 10/20/2016 0505   MCV 116.9 (H) 08/12/2016 1210   MCH 36.8 (H) 10/20/2016 0505   MCHC 35.1 10/20/2016 0505   RDW 15.1 10/20/2016 0505   RDW 18.6 (H) 08/12/2016 1210   LYMPHSABS 0.6 (L) 08/12/2016 1210   MONOABS 0.3 08/12/2016 1210   EOSABS 0.0 08/12/2016 1210   BASOSABS 0.0 08/12/2016 1210    CMP     Component Value Date/Time   NA 133 (L) 10/20/2016 0632   NA 134 (L) 05/05/2016 1015   K 4.0 10/20/2016 0632   K 4.5 05/05/2016 1015   CL 96 (L) 10/20/2016 0632   CL 102 02/27/2013 1045   CO2 28 10/20/2016 0632   CO2 30 (H) 05/05/2016 1015   GLUCOSE 90 10/20/2016 0632   GLUCOSE 97 05/05/2016 1015   GLUCOSE 118 (H) 02/27/2013 1045   BUN 14 10/20/2016 0632   BUN 9.8 05/05/2016 1015   CREATININE 0.36 (L) 10/20/2016 0632   CREATININE 0.7 05/05/2016 1015   CALCIUM 8.7 (L) 10/20/2016 0632   CALCIUM 9.4 05/05/2016 1015   PROT 7.2 10/20/2016 0632   PROT 7.6 05/05/2016 1015   ALBUMIN 3.9 10/20/2016 0632   ALBUMIN 3.9 05/05/2016 1015   AST 31 10/20/2016 0632   AST 39 (H) 05/05/2016 1015   ALT 32 10/20/2016 0632   ALT 35 05/05/2016 1015   ALKPHOS 75 10/20/2016 0632   ALKPHOS 106 05/05/2016 1015   BILITOT 0.4 10/20/2016 0632   BILITOT 0.51 05/05/2016 1015   GFRNONAA >60 10/20/2016 0632   GFRAA >60 10/20/2016 [id number]    Assessment: 1. Chronic pancreatitis: Per patient first diagnosed in 2006/7, apparently has suffered from chronic pain ever since, thought related to alcohol use initially, patient denies alcohol use since time of first diagnosis, pain worse immediately after eating, would like to consider other causes such as gastric origin today; also vague h/o pancreatic sx?? 2. Epigastric pain: See above, also with daily heartburn, consider  H. pylori versus chronic gastritis versus other 3. Heartburn: See above 4. Screening colorectal cancer: Patient does have history of throat cancer, has never had screening colonoscopy, recommend one at this time  Plan: 1. Patient has never had EGD or colonoscopy. Due to chronic epigastric pain and heartburn recommend an EGD. Will also proceed with colonoscopy for screening purposes. Discussed risks, benefits, limitations and alternatives the patient agrees to proceed. These were scheduled in the [facility] with Dr. Fuller Plan as he is the supervising physician this morning. 2. Referred patient to pain clinic in the area for chronic pain prescription use 3. Prescribed Tramadol 50 mg every 4-6 hours #20 with no refills 4. Prescribed Omeprazole 40 mg twice a day, 30-60 minutes before eating breakfast and dinner. We discussed in detail that at least some of patient's symptoms are coming from inflammation in his stomach/reflux 5. Provided patient with samples of Omeprazole 20 mg, instructed him to take 2 tabs twice a day 6. Patient to follow in clinic per Dr. Lynne Leader recommendations after time of procedures. 7. Discussed with the patient that heneeds to proceed to the ER if he has an increase or worsening of his pain  Ellouise Newer, PA-C [facility] 10/22/2016, 1:20 PM  Cc: Harvie Junior, MD

## 2016-11-01 ENCOUNTER — Encounter: Payer: Self-pay | Admitting: Physician Assistant

## 2016-11-01 NOTE — Telephone Encounter (Signed)
Error

## 2016-12-09 ENCOUNTER — Emergency Department (HOSPITAL_COMMUNITY)
Admission: EM | Admit: 2016-12-09 | Discharge: 2016-12-09 | Disposition: A | Discharge status: Home / Self Care | Payer: Medicaid Other | Attending: Emergency Medicine | Admitting: Emergency Medicine

## 2016-12-09 ENCOUNTER — Emergency Department (HOSPITAL_COMMUNITY): Payer: Medicaid Other

## 2016-12-09 ENCOUNTER — Encounter (HOSPITAL_COMMUNITY): Payer: Self-pay

## 2016-12-09 DIAGNOSIS — R1013 Epigastric pain: Secondary | ICD-10-CM | POA: Diagnosis not present

## 2016-12-09 DIAGNOSIS — R109 Unspecified abdominal pain: Secondary | ICD-10-CM

## 2016-12-09 DIAGNOSIS — F17210 Nicotine dependence, cigarettes, uncomplicated: Secondary | ICD-10-CM | POA: Diagnosis not present

## 2016-12-09 DIAGNOSIS — Z85818 Personal history of malignant neoplasm of other sites of lip, oral cavity, and pharynx: Secondary | ICD-10-CM | POA: Diagnosis not present

## 2016-12-09 DIAGNOSIS — J189 Pneumonia, unspecified organism: Secondary | ICD-10-CM | POA: Insufficient documentation

## 2016-12-09 DIAGNOSIS — J449 Chronic obstructive pulmonary disease, unspecified: Secondary | ICD-10-CM | POA: Diagnosis not present

## 2016-12-09 DIAGNOSIS — E039 Hypothyroidism, unspecified: Secondary | ICD-10-CM | POA: Diagnosis not present

## 2016-12-09 DIAGNOSIS — Z79899 Other long term (current) drug therapy: Secondary | ICD-10-CM | POA: Diagnosis not present

## 2016-12-09 DIAGNOSIS — R05 Cough: Secondary | ICD-10-CM | POA: Diagnosis present

## 2016-12-09 DIAGNOSIS — I10 Essential (primary) hypertension: Secondary | ICD-10-CM | POA: Diagnosis not present

## 2016-12-09 LAB — CBC WITH DIFFERENTIAL/PLATELET
Basophils Absolute: 0 10*3/uL (ref 0.0–0.1)
Basophils Relative: 0 %
Eosinophils Absolute: 0.1 10*3/uL (ref 0.0–0.7)
Eosinophils Relative: 1 %
HCT: 38.2 % — ABNORMAL LOW (ref 39.0–52.0)
Hemoglobin: 12.8 g/dL — ABNORMAL LOW (ref 13.0–17.0)
Lymphocytes Relative: 15 %
Lymphs Abs: 0.7 10*3/uL (ref 0.7–4.0)
MCH: 33.3 pg (ref 26.0–34.0)
MCHC: 33.5 g/dL (ref 30.0–36.0)
MCV: 99.5 fL (ref 78.0–100.0)
Monocytes Absolute: 0.6 10*3/uL (ref 0.1–1.0)
Monocytes Relative: 13 %
Neutro Abs: 3.1 10*3/uL (ref 1.7–7.7)
Neutrophils Relative %: 71 %
Platelets: 125 10*3/uL — ABNORMAL LOW (ref 150–400)
RBC: 3.84 MIL/uL — ABNORMAL LOW (ref 4.22–5.81)
RDW: 14.9 % (ref 11.5–15.5)
WBC: 4.4 10*3/uL (ref 4.0–10.5)

## 2016-12-09 LAB — COMPREHENSIVE METABOLIC PANEL
ALT: 23 U/L (ref 17–63)
AST: 27 U/L (ref 15–41)
Albumin: 4.3 g/dL (ref 3.5–5.0)
Alkaline Phosphatase: 84 U/L (ref 38–126)
Anion gap: 6 (ref 5–15)
BUN: 10 mg/dL (ref 6–20)
CO2: 33 mmol/L — ABNORMAL HIGH (ref 22–32)
Calcium: 9.4 mg/dL (ref 8.9–10.3)
Chloride: 97 mmol/L — ABNORMAL LOW (ref 101–111)
Creatinine, Ser: 0.54 mg/dL — ABNORMAL LOW (ref 0.61–1.24)
GFR calc Af Amer: >60 mL/min (ref >60)
GFR calc non Af Amer: >60 mL/min (ref >60)
Glucose, Bld: 118 mg/dL — ABNORMAL HIGH (ref 65–99)
Potassium: 4.4 mmol/L (ref 3.5–5.1)
Sodium: 136 mmol/L (ref 135–145)
Total Bilirubin: 0.4 mg/dL (ref 0.3–1.2)
Total Protein: 8.1 g/dL (ref 6.5–8.1)

## 2016-12-09 LAB — LIPASE, BLOOD: Lipase: 13 U/L (ref 11–51)

## 2016-12-09 MED ORDER — OXYCODONE-ACETAMINOPHEN 5-325 MG PO TABS
1.0000 | ORAL_TABLET | Freq: Once | ORAL | Status: AC
  Administered 2016-12-09: 1 via ORAL
  Filled 2016-12-09: qty 1

## 2016-12-09 MED ORDER — AMOXICILLIN 500 MG PO CAPS
1000.0000 mg | ORAL_CAPSULE | Freq: Once | ORAL | Status: AC
  Administered 2016-12-09: 1000 mg via ORAL
  Filled 2016-12-09: qty 2

## 2016-12-09 MED ORDER — AMOXICILLIN 500 MG PO CAPS: 500.0000 mg | ORAL_CAPSULE | Freq: Three times a day (TID) | ORAL | 0 refills | Status: DC

## 2016-12-09 MED ORDER — AZITHROMYCIN 250 MG PO TABS
500.0000 mg | ORAL_TABLET | Freq: Once | ORAL | Status: AC
  Administered 2016-12-09: 500 mg via ORAL
  Filled 2016-12-09: qty 2

## 2016-12-09 MED ORDER — AZITHROMYCIN 250 MG PO TABS: 250.0000 mg | ORAL_TABLET | Freq: Every day | ORAL | 0 refills | Status: DC

## 2016-12-09 NOTE — ED Notes (Signed)
Attempted to drawn blood but unsuccessful.  Contacted main phlebotomy to draw labs.

## 2016-12-09 NOTE — ED Notes (Signed)
Patient is A & O x4.  He understood discharge instructions 

## 2016-12-09 NOTE — ED Triage Notes (Signed)
Patient c/o mid upper abdominal pain x 3 days and a productive cough with gray tinged sputum x 3 days. Patient denies fever or chest pain.

## 2016-12-09 NOTE — ED Provider Notes (Signed)
[facility] DEPT Provider Note   CSN: [id number] Arrival date & time: 12/09/16  1453     History   Chief Complaint Chief Complaint  Patient presents with  . Cough  . Abdominal Pain    HPI Russell Williamson is a 61 y.o. male.  HPI   9yM with mid upper abdominal pain x 3 days and a productive cough with gray tinged sputum x 3 days. Patient denies fever or chest pain. Hypoxic when arrived to ED but likley from mucus plug because resolved after coughing up large amount of thick sputum.   Past Medical History:  Diagnosis Date  . Anemia   . Arthritis   . Cervical adenopathy 04/26/2012  . Chronic pancreatitis ([place])   . Constipation   . COPD (chronic obstructive pulmonary disease) ([place])   . Daily headache 05/23/2012   "small ones"  . Deficiency anemia 04/15/2016  . G tube feedings ([place])    has a feeding tube in stomach  . History of blood transfusion ~ 2004   "from the pancreatitis"  . History of radiation therapy 02/02/2011-03/22/2011   head/neck,left tonsil  . Hx of radiation therapy 02/02/11 to 03/22/11   L tonsil  . Hypertension   . Hypothyroidism 10/29/2013  . 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 Neck malignant neoplasm ([place]) 05/23/2012  . Pancytopenia ([place]) 02/28/2014  . Poor venous access 04/28/2016  . Seizures ([place])    alcohol-related  . Thrush 11/02/2013  . 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 Urinary hesitancy     Patient Active Problem List   Diagnosis Date Noted  . 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 PICC (peripherally inserted central catheter) flush 05/07/2016  . Port catheter in place 05/05/2016  . Poor venous access 04/28/2016  . Weight loss 04/16/2016  . Deficiency anemia 04/15/2016  . Chronic leukopenia 04/19/2015  . Anemia in chronic illness 04/19/2015  . Weight loss, unintentional 04/19/2015  . Relapsing chronic pancreatitis ([place]) 01/17/2015  . Diarrhea 09/04/2014  . 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 Malnutrition of moderate degree ([place]) 09/03/2014  .  Hypokalemia 09/02/2014  . Pancreatitis 09/01/2014  . Chronic alcoholic pancreatitis ([place]) 09/01/2014  . Mucositis 06/17/2014  . Preventive measure 06/17/2014  . Chronic neck pain 06/17/2014  . S/P gastrostomy ([place]) 06/17/2014  . Nicotine abuse 02/28/2014  . Pancytopenia ([place]) 02/28/2014  . Hypothyroidism (acquired) 11/02/2013  . Hypothyroidism 10/29/2013  . 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 Cervical adenopathy 04/26/2012  . Hiatal hernia   . Hx of radiation therapy   . HTN (hypertension) 10/16/2011  . History of cancer tonsil 08/20/2011  . Chronic pancreatitis [facility])     Past Surgical History:  Procedure Laterality Date  . BILE DUCT STENT PLACEMENT     hx of  . CHOLECYSTECTOMY  04/2005  . DIRECT LARYNGOSCOPY  05/23/2012   Procedure: DIRECT LARYNGOSCOPY;  Surgeon: Rozetta Nunnery, MD;  Location: [facility];  Service: ENT;  Laterality: N/A;  . DIRECT LARYNGOSCOPY N/A 03/23/2013   Procedure: DIRECT LARYNGOSCOPY;  Surgeon: Rozetta Nunnery, MD;  Location: [facility];  Service: ENT;  Laterality: N/A;  . ESOPHAGEAL DILATION N/A 03/23/2013   Procedure: ESOPHAGEAL DILATION;  Surgeon: Rozetta Nunnery, MD;  Location: [facility];  Service: ENT;  Laterality: N/A;  . FRACTURE SURGERY    . IR GENERIC HISTORICAL  04/29/2016   IR US GUIDE VASC ACCESS RIGHT 04/29/2016 [facility]  . IR GENERIC HISTORICAL  04/29/2016   IR FLUORO GUIDE CV LINE RIGHT 04/29/2016 [facility]  . IR GENERIC HISTORICAL  05/12/2016   IR US GUIDE VASC ACCESS LEFT 05/12/2016 [facility]  . IR GENERIC HISTORICAL  05/12/2016   IR FLUORO GUIDE CV LINE LEFT 05/12/2016 [facility]  . IR GENERIC HISTORICAL  06/28/2016   IR US GUIDE VASC ACCESS RIGHT 06/28/2016 Sandi Mariscal, MD [facility]  . IR GENERIC HISTORICAL  06/28/2016   IR FLUORO GUIDE PORT INSERTION RIGHT 06/28/2016 Sandi Mariscal, MD [facility]  . MASS BIOPSY  05/23/2012   Procedure: NECK MASS BIOPSY;  Surgeon: Rozetta Nunnery, MD;  Location: [place];  Service: ENT;  Laterality: N/A;  . RADICAL NECK DISSECTION  05/23/2012   w/mass excision  . RADICAL NECK DISSECTION  05/23/2012   Procedure: RADICAL NECK DISSECTION;  Surgeon: Rozetta Nunnery, MD;  Location: [facility];  Service: ENT;  Laterality: Left;  . TIBIA FRACTURE SURGERY  1990's   left       Home Medications    Prior to Admission medications   Medication Sig Start Date End Date Taking? Authorizing Provider  dicyclomine (BENTYL) 20 MG tablet Take 1 tablet (20 mg total) by mouth 2 (two) times daily. 10/07/16   Gareth Morgan, MD  doxylamine, Sleep, (UNISOM) 25 MG tablet Take 75 mg by mouth at bedtime as needed for sleep (sleep).     Historical Provider, MD  folic acid (FOLVITE) 1 MG tablet Take 1 mg by mouth every morning.  05/14/15   Historical Provider, MD  lidocaine-prilocaine (EMLA) cream Apply 1 application topically as needed. To port a cath site one hour before needle stick. 06/29/16   Heath Lark, MD  lipase/protease/amylase (CREON-12/PANCREASE) 12000 UNITS CPEP capsule Take 1 capsule by mouth 4 (four) times daily.    Historical Provider, MD  mirtazapine (REMERON) 30 MG tablet Take 30 mg by mouth at bedtime.    Historical Provider, MD  Omega-3 Fatty Acids (FISH OIL PO) Take 1 capsule by mouth every morning.     Historical Provider, MD  ondansetron (ZOFRAN ODT) 8 MG disintegrating tablet 8mg  ODT q4 hours prn nausea 1/74/94   Delora Fuel, MD  oxyCODONE (ROXICODONE) 15 MG immediate release tablet Take 15 mg by mouth every 4 (four) hours as needed for pain.  06/25/16   Historical Provider, MD  pantoprazole (PROTONIX) 20 MG tablet Take 1 tablet (20 mg total) by mouth 2 (two) times daily. 10/07/16   Gareth Morgan, MD  PROAIR HFA 108 (90 Base) MCG/ACT inhaler Inhale 2 puff into lungs every 4-6 hours as needed for shortness of breath/wheezing 02/02/16   Historical Provider, MD  promethazine (PHENERGAN) 25 MG tablet Take 1 tablet (25 mg total) by mouth every 6 (six) hours as needed for  nausea. 05/25/16   Heather Laisure, PA-C  promethazine (PHENERGAN) 25 MG tablet Take 1 tablet (25 mg total) by mouth every 6 (six) hours as needed for nausea or vomiting. 10/07/16   Gareth Morgan, MD  traMADol (ULTRAM) 50 MG tablet Take 1 tablet (50 mg total) by mouth every 6 (six) hours as needed. 10/22/16   Levin Erp, PA  traZODone (DESYREL) 100 MG tablet Take 100 mg by mouth at bedtime. 06/05/16   Historical Provider, MD    Family History Family History  Problem Relation Age of Onset  . COPD Mother     Social History Social History  Substance Use Topics  . Smoking status: Current Every Day Smoker    Packs/day: 0.25    Years: 40.00    Types: Cigarettes  .  Smokeless tobacco: Never Used     Comment: hx 1/2 -1 PPD  . Alcohol use No     Comment: 05/23/2012 hx alcohol abuse, quit ~ 2000     Allergies   Patient has no known allergies.   Review of Systems Review of Systems  All systems reviewed and negative, other than as noted in HPI.  Physical Exam Updated Vital Signs BP 118/84 (BP Location: Left Arm)   Pulse 88   Temp 98.4 F (36.9 C) (Oral)   Resp 18   Ht 5\' 6"  (1.676 m)   Wt 99 lb (44.9 kg)   SpO2 99%   BMI 15.98 kg/m   Physical Exam  Constitutional: He appears well-developed and well-nourished. No distress.  Frail/chronically ill appearing, but in NAD.   HENT:  Head: Normocephalic and atraumatic.  Eyes: Conjunctivae are normal. Right eye exhibits no discharge. Left eye exhibits no discharge.  Neck: Neck supple.  Cardiovascular: Normal rate, regular rhythm and normal heart sounds.  Exam reveals no gallop and no friction rub.   No murmur heard. Pulmonary/Chest: Effort normal and breath sounds normal. No respiratory distress.  Abdominal: Soft. He exhibits no distension. There is tenderness.  Mild epigastric tenderness w/o rebound or guarding  Musculoskeletal: He exhibits no edema or tenderness.  Neurological: He is alert.  Skin: Skin is warm and dry.   Psychiatric: He has a normal mood and affect. His behavior is normal. Thought content normal.  Nursing note and vitals reviewed.    ED Treatments / Results  Labs (all labs ordered are listed, but only abnormal results are displayed) Labs Reviewed  CBC WITH DIFFERENTIAL/PLATELET - Abnormal; Notable for the following:       Result Value   RBC 3.84 (*)    Hemoglobin 12.8 (*)    HCT 38.2 (*)    Platelets 125 (*)    All other components within normal limits  COMPREHENSIVE METABOLIC PANEL - Abnormal; Notable for the following:    Chloride 97 (*)    CO2 33 (*)    Glucose, Bld 118 (*)    Creatinine, Ser 0.54 (*)    All other components within normal limits  LIPASE, BLOOD    EKG  EKG Interpretation None       Radiology No results found.   Dg Chest 2 View  Result Date: 12/09/2016 CLINICAL DATA:  Three days of mid upper abdominal pain associated with productive cough. History of COPD, chronic pancreatitis, tonsillar malignancy, current smoker. EXAM: CHEST  2 VIEW COMPARISON:  PA and lateral chest x-ray of October 07, 2016 FINDINGS: The lungs are hyperinflated. The lung markings are coarse in the right infrahilar region and are more conspicuous than in the past. The heart and pulmonary vascularity are normal. The mediastinum is normal in width. The trachea is midline. The bony thorax exhibits no acute abnormality. IMPRESSION: COPD. Increased density in the right lower lobe compatible with early interstitial pneumonia. Followup PA and lateral chest X-ray is recommended in 3-4 weeks following trial of antibiotic therapy to ensure resolution and exclude underlying malignancy. Electronically Signed   By: David  Martinique M.D.   On: 12/09/2016 16:44   Procedures Procedures (including critical care time)  Medications Ordered in ED Medications - No data to display   Initial Impression / Assessment and Plan / ED Course  I have reviewed the triage vital signs and the nursing  notes.  Pertinent labs & imaging results that were available during my care of the patient were reviewed  by me and considered in my medical decision making (see chart for details).    60yM with cough. Hypoxic on arrival. Suspect mucus plug. Coughed up large amount of sputum shortly after arrival and hypoxemia resolved and he reports feeling better. Imaging noted. I feel at this point he can be tx'd as outpt. It has been determined that no acute conditions requiring further emergency intervention are present at this time. The patient has been advised of the diagnosis and plan. I reviewed any labs and imaging including any potential incidental findings. We have discussed signs and symptoms that warrant return to the ED and they are listed in the discharge instructions.    Final Clinical Impressions(s) / ED Diagnoses   Final diagnoses:  Community acquired pneumonia, unspecified laterality  Abdominal pain, unspecified abdominal location    New Prescriptions New Prescriptions   No medications on file     Virgel Manifold, MD 12/21/16 1339

## 2016-12-09 NOTE — ED Notes (Signed)
Patient coughed up a large amount of thick yellow sputum and sats increased to 98%.

## 2016-12-09 NOTE — ED Notes (Signed)
Discharged by Magda Paganini, RN

## 2016-12-13 ENCOUNTER — Encounter: Payer: Self-pay | Admitting: Gastroenterology

## 2017-01-27 ENCOUNTER — Ambulatory Visit (HOSPITAL_COMMUNITY)
Admission: RE | Admit: 2017-01-27 | Discharge: 2017-01-27 | Disposition: A | Discharge status: Home / Self Care | Payer: Medicaid Other | Source: Ambulatory Visit | Attending: Hematology and Oncology | Admitting: Hematology and Oncology

## 2017-01-27 ENCOUNTER — Encounter: Payer: Self-pay | Admitting: Hematology and Oncology

## 2017-01-27 ENCOUNTER — Ambulatory Visit (HOSPITAL_BASED_OUTPATIENT_CLINIC_OR_DEPARTMENT_OTHER): Payer: Medicaid Other

## 2017-01-27 ENCOUNTER — Telehealth: Payer: Self-pay | Admitting: Hematology and Oncology

## 2017-01-27 ENCOUNTER — Other Ambulatory Visit (HOSPITAL_BASED_OUTPATIENT_CLINIC_OR_DEPARTMENT_OTHER): Payer: Medicaid Other

## 2017-01-27 ENCOUNTER — Ambulatory Visit (HOSPITAL_BASED_OUTPATIENT_CLINIC_OR_DEPARTMENT_OTHER): Payer: Medicaid Other | Admitting: Hematology and Oncology

## 2017-01-27 VITALS — BP 145/84 | HR 82 | Temp 98.2°F | Resp 17 | Ht 66.0 in | Wt 105.5 lb

## 2017-01-27 DIAGNOSIS — D61818 Other pancytopenia: Secondary | ICD-10-CM

## 2017-01-27 DIAGNOSIS — J441 Chronic obstructive pulmonary disease with (acute) exacerbation: Secondary | ICD-10-CM | POA: Diagnosis not present

## 2017-01-27 DIAGNOSIS — Z85818 Personal history of malignant neoplasm of other sites of lip, oral cavity, and pharynx: Secondary | ICD-10-CM

## 2017-01-27 DIAGNOSIS — D539 Nutritional anemia, unspecified: Secondary | ICD-10-CM | POA: Diagnosis not present

## 2017-01-27 DIAGNOSIS — R918 Other nonspecific abnormal finding of lung field: Secondary | ICD-10-CM | POA: Diagnosis not present

## 2017-01-27 DIAGNOSIS — K861 Other chronic pancreatitis: Secondary | ICD-10-CM | POA: Diagnosis not present

## 2017-01-27 DIAGNOSIS — E46 Unspecified protein-calorie malnutrition: Secondary | ICD-10-CM

## 2017-01-27 DIAGNOSIS — Z720 Tobacco use: Secondary | ICD-10-CM

## 2017-01-27 DIAGNOSIS — B370 Candidal stomatitis: Secondary | ICD-10-CM | POA: Diagnosis not present

## 2017-01-27 DIAGNOSIS — C099 Malignant neoplasm of tonsil, unspecified: Secondary | ICD-10-CM

## 2017-01-27 DIAGNOSIS — Z95828 Presence of other vascular implants and grafts: Secondary | ICD-10-CM

## 2017-01-27 DIAGNOSIS — Z452 Encounter for adjustment and management of vascular access device: Secondary | ICD-10-CM

## 2017-01-27 LAB — CBC & DIFF AND RETIC
BASO%: 0.2 % (ref 0.0–2.0)
Basophils Absolute: 0 10*3/uL (ref 0.0–0.1)
EOS%: 1.4 % (ref 0.0–7.0)
Eosinophils Absolute: 0.1 10*3/uL (ref 0.0–0.5)
HCT: 37.7 % — ABNORMAL LOW (ref 38.4–49.9)
HGB: 13.1 g/dL (ref 13.0–17.1)
Immature Retic Fract: 3.3 % (ref 3.00–10.60)
LYMPH%: 25.9 % (ref 14.0–49.0)
MCH: 33.1 pg (ref 27.2–33.4)
MCHC: 34.7 g/dL (ref 32.0–36.0)
MCV: 95.2 fL (ref 79.3–98.0)
MONO#: 0.5 10*3/uL (ref 0.1–0.9)
MONO%: 11.7 % (ref 0.0–14.0)
NEUT%: 60.8 % (ref 39.0–75.0)
NEUTROS ABS: 2.7 10*3/uL (ref 1.5–6.5)
Platelets: 159 10*3/uL (ref 140–400)
RBC: 3.96 10*6/uL — AB (ref 4.20–5.82)
RDW: 15.1 % — ABNORMAL HIGH (ref 11.0–14.6)
Retic %: 1.23 % (ref 0.80–1.80)
Retic Ct Abs: 48.71 10*3/uL (ref 34.80–93.90)
WBC: 4.4 10*3/uL (ref 4.0–10.3)
lymph#: 1.1 10*3/uL (ref 0.9–3.3)
nRBC: 0 % (ref 0–0)

## 2017-01-27 MED ORDER — SODIUM CHLORIDE 0.9 % IJ SOLN
10.0000 mL | INTRAMUSCULAR | Status: DC | PRN
  Administered 2017-01-27: 10 mL via INTRAVENOUS
  Filled 2017-01-27: qty 10

## 2017-01-27 MED ORDER — AMOXICILLIN-POT CLAVULANATE 875-125 MG PO TABS: 1.0000 | ORAL_TABLET | Freq: Two times a day (BID) | ORAL | 0 refills | Status: DC

## 2017-01-27 MED ORDER — OXYCODONE-ACETAMINOPHEN 5-325 MG PO TABS
1.0000 | ORAL_TABLET | Freq: Once | ORAL | Status: AC
  Administered 2017-01-27: 1 via ORAL

## 2017-01-27 MED ORDER — NICOTINE 21 MG/24HR TD PT24: 21.0000 mg | MEDICATED_PATCH | Freq: Every day | TRANSDERMAL | 0 refills | Status: DC

## 2017-01-27 MED ORDER — FLUCONAZOLE 100 MG PO TABS: 100.0000 mg | ORAL_TABLET | Freq: Every day | ORAL | 0 refills | Status: DC

## 2017-01-27 MED ORDER — HEPARIN SOD (PORK) LOCK FLUSH 100 UNIT/ML IV SOLN
500.0000 [IU] | Freq: Once | INTRAVENOUS | Status: AC | PRN
  Administered 2017-01-27: 500 [IU] via INTRAVENOUS
  Filled 2017-01-27: qty 5

## 2017-01-27 MED ORDER — OXYCODONE-ACETAMINOPHEN 5-325 MG PO TABS
ORAL_TABLET | ORAL | Status: AC
  Filled 2017-01-27: qty 1

## 2017-01-27 NOTE — Assessment & Plan Note (Signed)
Bone marrow biopsy excluded malignancy His chronic anemia is likely due to his chronic pancreatitis Since he has stopped drinking alcohol, pancytopenia has resolved I will get his port removed

## 2017-01-27 NOTE — Assessment & Plan Note (Signed)
He has signs of oral thrush, likely due to malnutrition and prior exposure to radiation treatment I recommend prescription antifungal treatment

## 2017-01-27 NOTE — Assessment & Plan Note (Signed)
CT scan showed no signs of local recurrence.  We discussed the importance of smoking cessation The patient is a long-term cancer survivor with no evidence of disease 5 years out Technically, he can be discharged from the cancer center but due to high risk of cancer recurrence, I will see him back in 12 months

## 2017-01-27 NOTE — Progress Notes (Signed)
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 CT chest showed marked emphysema with progression of the irregular bandlike and nodular opacities in the posterior lower lobes of the been present since at least 06/04/2014. Abnormal limited abdominal structures      05/07/2016 Imaging    Slight interval progression of right-greater-than-left subpleural consolidative opacities which are nonspecific. These may representchronic scarring, infection or malignancy. Sequelae of chronic pancreatitis.       06/28/2016 Procedure    Successful placement of a right common femoral vein approach power injectable Port-A-Cath. The catheter is ready for immediate use       INTERVAL HISTORY: Please see below for problem oriented charting. He returns for further follow-up Since the last time he was seen here, he had recurrent emergency room visits for various issues including pneumonia He continues to have cough with productive sputum He continues to smoke but is interested to quit smoking He denies recent alcohol intake He started to eat better and has gained some weight He continues to have chronic pain, managed through his primary care doctor  REVIEW OF SYSTEMS:   Constitutional: Denies fevers, chills or abnormal weight loss Eyes: Denies blurriness of vision Ears, nose, mouth, throat, and face: Denies mucositis or sore throat Cardiovascular: Denies palpitation, chest discomfort or lower extremity swelling Gastrointestinal:  Denies nausea, heartburn or  change in bowel habits Skin: Denies abnormal skin rashes Lymphatics: Denies new lymphadenopathy or easy bruising Neurological:Denies numbness, tingling or new weaknesses Behavioral/Psych: Mood is stable, no new changes  All other systems were reviewed with the patient and are negative.  I have reviewed the past medical history, past surgical history, social history and family history with the patient and they are unchanged from previous note.  ALLERGIES:  has No Known Allergies.  MEDICATIONS:  Current Outpatient Prescriptions  Medication Sig Dispense Refill  . amLODipine (NORVASC) 2.5 MG tablet TK 1 T PO QD  0  . doxylamine, Sleep, (UNISOM) 25 MG tablet Take 75 mg by mouth at bedtime as needed for sleep (sleep).     . folic acid (FOLVITE) 1 MG tablet Take 1 mg by mouth every morning.   5  . lidocaine-prilocaine (EMLA) cream Apply 1 application topically as needed. To port a cath site one hour before needle stick. 30 g 3  . lipase/protease/amylase (CREON-12/PANCREASE) 12000 UNITS CPEP capsule Take 1 capsule by mouth 4 (four) times daily.    . mirtazapine (REMERON) 30 MG tablet Take 30 mg by mouth at bedtime.    . Omega-3 Fatty Acids (FISH OIL PO) Take 1 capsule by mouth every morning.     Marland Kitchen oxyCODONE (ROXICODONE) 15 MG immediate release tablet Take 15 mg by mouth every 4 (four) hours as needed for pain.   0  . pantoprazole (PROTONIX) 20 MG tablet Take 1 tablet (20 mg total) by mouth 2 (two) times daily. 30 tablet 0  . PROAIR HFA 108 (90 Base) MCG/ACT inhaler Inhale 2 puff into lungs every 4-6 hours as needed for shortness of breath/wheezing  5  . promethazine (  PHENERGAN) 25 MG tablet Take 1 tablet (25 mg total) by mouth every 6 (six) hours as needed for nausea. 20 tablet 0  . promethazine (PHENERGAN) 25 MG tablet Take 1 tablet (25 mg total) by mouth every 6 (six) hours as needed for nausea or vomiting. 20 tablet 0  . temazepam (RESTORIL) 30 MG capsule TK 1 C PO QHS PRF SLEEP  0  . traMADol  (ULTRAM) 50 MG tablet Take 1 tablet (50 mg total) by mouth every 6 (six) hours as needed. (Patient taking differently: Take 50 mg by mouth every 6 (six) hours as needed for moderate pain or severe pain. ) 20 tablet 0  . amoxicillin-clavulanate (AUGMENTIN) 875-125 MG tablet Take 1 tablet by mouth 2 (two) times daily. 14 tablet 0  . dicyclomine (BENTYL) 20 MG tablet Take 1 tablet (20 mg total) by mouth 2 (two) times daily. (Patient not taking: Reported on 12/09/2016) 20 tablet 0  . fluconazole (DIFLUCAN) 100 MG tablet Take 1 tablet (100 mg total) by mouth daily. 7 tablet 0  . nicotine (NICODERM CQ - DOSED IN MG/24 HOURS) 21 mg/24hr patch Place 1 patch (21 mg total) onto the skin daily. 28 patch 0   No current facility-administered medications for this visit.     PHYSICAL EXAMINATION: ECOG PERFORMANCE STATUS: 1 - Symptomatic but completely ambulatory  Vitals:   01/27/17 1404  BP: (!) 145/84  Pulse: 82  Resp: 17  Temp: 98.2 F (36.8 C)   Filed Weights   01/27/17 1404  Weight: 105 lb 8 oz (47.9 kg)    GENERAL:alert, no distress and comfortable.  He is thin and cachectic SKIN: skin color, texture, turgor are normal, no rashes or significant lesions EYES: normal, Conjunctiva are pink and non-injected, sclera clear OROPHARYNX: Noted oral thrush.  He is edentulous NECK: supple, thyroid normal size, non-tender, without nodularity LYMPH:  no palpable lymphadenopathy in the cervical, axillary or inguinal LUNGS: Bilateral crackles are audible on exam at the bases, normal breathing effort HEART: regular rate & rhythm and no murmurs and no lower extremity edema ABDOMEN:abdomen soft, non-tender and normal bowel sounds Musculoskeletal:no cyanosis of digits and no clubbing  NEURO: alert & oriented x 3 with fluent speech, no focal motor/sensory deficits  LABORATORY DATA:  I have reviewed the data as listed    Component Value Date/Time   NA 136 12/09/2016 1650   NA 134 (L) 05/05/2016 1015   K  4.4 12/09/2016 1650   K 4.5 05/05/2016 1015   CL 97 (L) 12/09/2016 1650   CL 102 02/27/2013 1045   CO2 33 (H) 12/09/2016 1650   CO2 30 (H) 05/05/2016 1015   GLUCOSE 118 (H) 12/09/2016 1650   GLUCOSE 97 05/05/2016 1015   GLUCOSE 118 (H) 02/27/2013 1045   BUN 10 12/09/2016 1650   BUN 9.8 05/05/2016 1015   CREATININE 0.54 (L) 12/09/2016 1650   CREATININE 0.7 05/05/2016 1015   CALCIUM 9.4 12/09/2016 1650   CALCIUM 9.4 05/05/2016 1015   PROT 8.1 12/09/2016 1650   PROT 7.6 05/05/2016 1015   ALBUMIN 4.3 12/09/2016 1650   ALBUMIN 3.9 05/05/2016 1015   AST 27 12/09/2016 1650   AST 39 (H) 05/05/2016 1015   ALT 23 12/09/2016 1650   ALT 35 05/05/2016 1015   ALKPHOS 84 12/09/2016 1650   ALKPHOS 106 05/05/2016 1015   BILITOT 0.4 12/09/2016 1650   BILITOT 0.51 05/05/2016 1015   GFRNONAA >60 12/09/2016 1650   GFRAA >60 12/09/2016 1650    No results  found for: SPEP, UPEP  Lab Results  Component Value Date   WBC 4.4 01/27/2017   NEUTROABS 2.7 01/27/2017   HGB 13.1 01/27/2017   HCT 37.7 (L) 01/27/2017   MCV 95.2 01/27/2017   PLT 159 01/27/2017      Chemistry      Component Value Date/Time   NA 136 12/09/2016 1650   NA 134 (L) 05/05/2016 1015   K 4.4 12/09/2016 1650   K 4.5 05/05/2016 1015   CL 97 (L) 12/09/2016 1650   CL 102 02/27/2013 1045   CO2 33 (H) 12/09/2016 1650   CO2 30 (H) 05/05/2016 1015   BUN 10 12/09/2016 1650   BUN 9.8 05/05/2016 1015   CREATININE 0.54 (L) 12/09/2016 1650   CREATININE 0.7 05/05/2016 1015      Component Value Date/Time   CALCIUM 9.4 12/09/2016 1650   CALCIUM 9.4 05/05/2016 1015   ALKPHOS 84 12/09/2016 1650   ALKPHOS 106 05/05/2016 1015   AST 27 12/09/2016 1650   AST 39 (H) 05/05/2016 1015   ALT 23 12/09/2016 1650   ALT 35 05/05/2016 1015   BILITOT 0.4 12/09/2016 1650   BILITOT 0.51 05/05/2016 1015       RADIOGRAPHIC STUDIES: I reviewed his last chest x-ray I have personally reviewed the radiological images as listed and agreed with  the findings in the report.   ASSESSMENT & PLAN:  History of cancer tonsil CT scan showed no signs of local recurrence.  We discussed the importance of smoking cessation The patient is a long-term cancer survivor with no evidence of disease 5 years out Technically, he can be discharged from the cancer center but due to high risk of cancer recurrence, I will see him back in 12 months  Pancytopenia (HCC) Bone marrow biopsy excluded malignancy His chronic anemia is likely due to his chronic pancreatitis Since he has stopped drinking alcohol, pancytopenia has resolved I will get his port removed  Chronic pancreatitis (HCC) The patient has chronic pain, malabsorption and malnutrition related to chronic pancreatitis I am not sure how I can help the patient but recommend referral to GI for further evaluation and management. The patient is clear that he has not drink alcohol for many months  COPD exacerbation (HCC) He has significant cough with productive sputum Examination of his lungs revealed bilateral crackles I would prescribe Augmentin for 1 week and recommend him to quit smoking I will order chest x-ray today  Oral thrush He has signs of oral thrush, likely due to malnutrition and prior exposure to radiation treatment I recommend prescription antifungal treatment  Nicotine abuse I spent some time counseling the patient the importance of tobacco cessation. We discussed common strategies including nicotine patches, Tobacco Quit-line, and other nicotine replacement products to assist in hiseffort to quit  he appears motivated to quit. I gave him prescription of nicotine patch to use and advised him to cut back on  cigarette use and follow-up with primary care doctor   Orders Placed This Encounter  Procedures  . IR Removal Tun Access W/ Port W/O FL    Standing Status:   Future    Standing Expiration Date:   03/29/2018    Order Specific Question:   Reason for exam:    Answer:    no longer port    Order Specific Question:   Preferred Imaging Location?    Answer:   [facility]  . DG Chest 2 View    Standing Status:   Future      Number of Occurrences:   1    Standing Expiration Date:   03/29/2018    Order Specific Question:   Reason for exam:    Answer:   pneumonia    Order Specific Question:   Preferred imaging location?    Answer:   [facility]   All questions were answered. The patient knows to call the clinic with any problems, questions or concerns. No barriers to learning was detected. I spent 20 minutes counseling the patient face to face. The total time spent in the appointment was 25 minutes and more than 50% was on counseling and review of test results     Heath Lark, MD 01/27/2017 4:39 PM

## 2017-01-27 NOTE — Assessment & Plan Note (Signed)
The patient has chronic pain, malabsorption and malnutrition related to chronic pancreatitis I am not sure how I can help the patient but recommend referral to GI for further evaluation and management. The patient is clear that he has not drink alcohol for many months

## 2017-01-27 NOTE — Telephone Encounter (Signed)
Gave patient AVS and calender per 4/26 los.  

## 2017-01-27 NOTE — Assessment & Plan Note (Signed)
He has significant cough with productive sputum Examination of his lungs revealed bilateral crackles I would prescribe Augmentin for 1 week and recommend him to quit smoking I will order chest x-ray today

## 2017-01-27 NOTE — Assessment & Plan Note (Signed)
I spent some time counseling the patient the importance of tobacco cessation. We discussed common strategies including nicotine patches, Tobacco Quit-line, and other nicotine replacement products to assist in hiseffort to quit  he appears motivated to quit. I gave him prescription of nicotine patch to use and advised him to cut back on  cigarette use and follow-up with primary care doctor

## 2017-01-31 ENCOUNTER — Telehealth: Payer: Self-pay

## 2017-01-31 NOTE — Telephone Encounter (Signed)
Russell Williamson,  This pt is a difficult intubation and requires a PICC line for IV access; consequently he does not qualify for care at [facility].  Thanks,  Jenny Reichmann

## 2017-01-31 NOTE — Telephone Encounter (Signed)
Dr Fuller Plan and Jenny Reichmann,      Please review chart.  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  May we proceed with direct endo/colon or ?OV ?[place] case?  Thank you for your consideration in this matter. Angela/PV

## 2017-01-31 NOTE — Telephone Encounter (Signed)
Addendum:  Pt had OV with MS in Jan 2018 so just need clearance from [facility].  Sorry about that. Angela/PV

## 2017-02-03 NOTE — Telephone Encounter (Signed)
Patient has been rescheduled to 03/01/17 [facility] arrive at 7:00 am for 8:30. Please notify the patient at pre-visit.

## 2017-02-07 ENCOUNTER — Ambulatory Visit (AMBULATORY_SURGERY_CENTER): Payer: Self-pay

## 2017-02-07 VITALS — Wt 105.4 lb

## 2017-02-07 DIAGNOSIS — Z1211 Encounter for screening for malignant neoplasm of colon: Secondary | ICD-10-CM

## 2017-02-07 MED ORDER — SUPREP BOWEL PREP KIT 17.5-3.13-1.6 GM/177ML PO SOLN: 1.0000 | Freq: Once | ORAL | 0 refills | Status: AC

## 2017-02-07 NOTE — Progress Notes (Signed)
No allergies to eggs or soy No past problems with anesthesia No diet meds No home oxygen  Registered emmi 

## 2017-02-09 ENCOUNTER — Other Ambulatory Visit: Payer: Self-pay | Admitting: Radiology

## 2017-02-10 ENCOUNTER — Ambulatory Visit (HOSPITAL_COMMUNITY)
Admission: RE | Admit: 2017-02-10 | Discharge: 2017-02-10 | Disposition: A | Discharge status: Home / Self Care | Payer: Medicaid Other | Source: Ambulatory Visit | Attending: Hematology and Oncology | Admitting: Hematology and Oncology

## 2017-02-10 ENCOUNTER — Encounter (HOSPITAL_COMMUNITY): Payer: Self-pay

## 2017-02-10 DIAGNOSIS — M1990 Unspecified osteoarthritis, unspecified site: Secondary | ICD-10-CM | POA: Diagnosis not present

## 2017-02-10 DIAGNOSIS — Z85818 Personal history of malignant neoplasm of other sites of lip, oral cavity, and pharynx: Secondary | ICD-10-CM | POA: Diagnosis not present

## 2017-02-10 DIAGNOSIS — Z452 Encounter for adjustment and management of vascular access device: Secondary | ICD-10-CM | POA: Insufficient documentation

## 2017-02-10 DIAGNOSIS — Z22322 Carrier or suspected carrier of Methicillin resistant Staphylococcus aureus: Secondary | ICD-10-CM | POA: Insufficient documentation

## 2017-02-10 DIAGNOSIS — Z931 Gastrostomy status: Secondary | ICD-10-CM | POA: Diagnosis not present

## 2017-02-10 DIAGNOSIS — E039 Hypothyroidism, unspecified: Secondary | ICD-10-CM | POA: Insufficient documentation

## 2017-02-10 DIAGNOSIS — I10 Essential (primary) hypertension: Secondary | ICD-10-CM | POA: Insufficient documentation

## 2017-02-10 DIAGNOSIS — K861 Other chronic pancreatitis: Secondary | ICD-10-CM | POA: Insufficient documentation

## 2017-02-10 DIAGNOSIS — J449 Chronic obstructive pulmonary disease, unspecified: Secondary | ICD-10-CM | POA: Diagnosis not present

## 2017-02-10 HISTORY — PX: IR REMOVAL TUN ACCESS W/ PORT W/O FL MOD SED: IMG2290

## 2017-02-10 LAB — CBC WITH DIFFERENTIAL/PLATELET
Basophils Absolute: 0 10*3/uL (ref 0.0–0.1)
Basophils Relative: 0 %
EOS PCT: 3 %
Eosinophils Absolute: 0.1 10*3/uL (ref 0.0–0.7)
HCT: 35.9 % — ABNORMAL LOW (ref 39.0–52.0)
Hemoglobin: 12.3 g/dL — ABNORMAL LOW (ref 13.0–17.0)
Lymphocytes Relative: 32 %
Lymphs Abs: 1 10*3/uL (ref 0.7–4.0)
MCH: 33.5 pg (ref 26.0–34.0)
MCHC: 34.3 g/dL (ref 30.0–36.0)
MCV: 97.8 fL (ref 78.0–100.0)
MONO ABS: 0.3 10*3/uL (ref 0.1–1.0)
Monocytes Relative: 10 %
NEUTROS PCT: 55 %
Neutro Abs: 1.6 10*3/uL — ABNORMAL LOW (ref 1.7–7.7)
PLATELETS: 165 10*3/uL (ref 150–400)
RBC: 3.67 MIL/uL — AB (ref 4.22–5.81)
RDW: 15.2 % (ref 11.5–15.5)
WBC: 3 10*3/uL — AB (ref 4.0–10.5)

## 2017-02-10 LAB — PROTIME-INR
INR: 1.05
PROTHROMBIN TIME: 13.7 s (ref 11.4–15.2)

## 2017-02-10 LAB — BASIC METABOLIC PANEL
Anion gap: 11 (ref 5–15)
BUN: 8 mg/dL (ref 6–20)
CALCIUM: 8.9 mg/dL (ref 8.9–10.3)
CO2: 26 mmol/L (ref 22–32)
CREATININE: 0.49 mg/dL — AB (ref 0.61–1.24)
Chloride: 96 mmol/L — ABNORMAL LOW (ref 101–111)
Glucose, Bld: 84 mg/dL (ref 65–99)
Potassium: 4.1 mmol/L (ref 3.5–5.1)
SODIUM: 133 mmol/L — AB (ref 135–145)

## 2017-02-10 MED ORDER — FENTANYL CITRATE (PF) 100 MCG/2ML IJ SOLN
INTRAMUSCULAR | Status: AC
  Filled 2017-02-10: qty 4

## 2017-02-10 MED ORDER — CEFAZOLIN SODIUM-DEXTROSE 2-4 GM/100ML-% IV SOLN
2.0000 g | INTRAVENOUS | Status: AC
  Administered 2017-02-10: 2 g via INTRAVENOUS

## 2017-02-10 MED ORDER — LIDOCAINE-EPINEPHRINE (PF) 2 %-1:200000 IJ SOLN
INTRAMUSCULAR | Status: AC | PRN
  Administered 2017-02-10: 10 mL

## 2017-02-10 MED ORDER — SODIUM CHLORIDE 0.9 % IV SOLN
INTRAVENOUS | Status: DC
  Administered 2017-02-10: 12:00:00 via INTRAVENOUS

## 2017-02-10 MED ORDER — MIDAZOLAM HCL 2 MG/2ML IJ SOLN
INTRAMUSCULAR | Status: AC
  Filled 2017-02-10: qty 6

## 2017-02-10 MED ORDER — MIDAZOLAM HCL 2 MG/2ML IJ SOLN
INTRAMUSCULAR | Status: AC | PRN
  Administered 2017-02-10 (×2): 1 mg via INTRAVENOUS

## 2017-02-10 MED ORDER — FENTANYL CITRATE (PF) 100 MCG/2ML IJ SOLN
INTRAMUSCULAR | Status: AC | PRN
  Administered 2017-02-10: 50 ug via INTRAVENOUS

## 2017-02-10 MED ORDER — CEFAZOLIN SODIUM-DEXTROSE 2-4 GM/100ML-% IV SOLN
INTRAVENOUS | Status: AC
  Filled 2017-02-10: qty 100

## 2017-02-10 MED ORDER — LIDOCAINE-EPINEPHRINE (PF) 2 %-1:200000 IJ SOLN
INTRAMUSCULAR | Status: AC
  Filled 2017-02-10: qty 20

## 2017-02-10 NOTE — Discharge Instructions (Signed)
Moderate Conscious Sedation, Adult, Care After These instructions provide you with information about caring for yourself after your procedure. Your health care provider may also give you more specific instructions. Your treatment has been planned according to current medical practices, but problems sometimes occur. Call your health care provider if you have any problems or questions after your procedure. What can I expect after the procedure? After your procedure, it is common:  To feel sleepy for several hours.  To feel clumsy and have poor balance for several hours.  To have poor judgment for several hours.  To vomit if you eat too soon. Follow these instructions at home: For at least 24 hours after the procedure:    Do not:  Participate in activities where you could fall or become injured.  Drive.  Use heavy machinery.  Drink alcohol.  Take sleeping pills or medicines that cause drowsiness.  Make important decisions or sign legal documents.  Take care of children on your own.  Rest. Eating and drinking   Follow the diet recommended by your health care provider.  If you vomit:  Drink water, juice, or soup when you can drink without vomiting.  Make sure you have little or no nausea before eating solid foods. General instructions   Have a responsible adult stay with you until you are awake and alert.  Take over-the-counter and prescription medicines only as told by your health care provider.  If you smoke, do not smoke without supervision.  Keep all follow-up visits as told by your health care provider. This is important. Contact a health care provider if:  You keep feeling nauseous or you keep vomiting.  You feel light-headed.  You develop a rash.  You have a fever. Get help right away if:  You have trouble breathing. This information is not intended to replace advice given to you by your health care provider. Make sure you discuss any questions you  have with your health care provider. Document Released: 07/11/2013 Document Revised: 02/23/2016 Document Reviewed: 01/10/2016 Elsevier Interactive Patient Education  2017 [place] Removal, Care After Refer to this sheet in the next few weeks. These instructions provide you with information about caring for yourself after your procedure. Your health care provider may also give you more specific instructions. Your treatment has been planned according to current medical practices, but problems sometimes occur. Call your health care provider if you have any problems or questions after your procedure. What can I expect after the procedure? After the procedure, it is common to have:  Soreness or pain near your incision.  Some swelling or bruising near your incision. Follow these instructions at home: Medicines   Take over-the-counter and prescription medicines only as told by your health care provider.  If you were prescribed an antibiotic medicine, take it as told by your health care provider. Do not stop taking the antibiotic even if you start to feel better. Bathing   Do not take baths, swim, or use a hot tub until your health care provider approves. Ask your health care provider if you can take showers. You may only be allowed to take sponge baths for bathing. Incision care   Follow instructions from your health care provider about how to take care of your incision. Make sure you:  Wash your hands with soap and water before you change your bandage (dressing). If soap and water are not available, use hand sanitizer.  Change your dressing as told by your health  care provider.  Keep your dressing dry.  Leave stitches (sutures), skin glue, or adhesive strips in place. These skin closures may need to stay in place for 2 weeks or longer. If adhesive strip edges start to loosen and curl up, you may trim the loose edges. Do not remove adhesive strips completely unless  your health care provider tells you to do that.  Check your incision area every day for signs of infection. Check for:  More redness, swelling, or pain.  More fluid or blood.  Warmth.  Pus or a bad smell. Driving   If you received a sedative, do not drive for 24 hours after the procedure.  If you did not receive a sedative, ask your health care provider when it is safe to drive. Activity   Return to your normal activities as told by your health care provider. Ask your health care provider what activities are safe for you.  Until your health care provider says it is safe:  Do not lift anything that is heavier than 10 lb (4.5 kg).  Do not do activities that involve lifting your arms over your head. General instructions   Do not use any tobacco products, such as cigarettes, chewing tobacco, and e-cigarettes. Tobacco can delay healing. If you need help quitting, ask your health care provider.  Keep all follow-up visits as told by your health care provider. This is important. Contact a health care provider if:  You have more redness, swelling, or pain around your incision.  You have more fluid or blood coming from your incision.  Your incision feels warm to the touch.  You have pus or a bad smell coming from your incision.  You have a fever.  You have pain that is not relieved by your pain medicine. Get help right away if:  You have chest pain.  You have difficulty breathing. This information is not intended to replace advice given to you by your health care provider. Make sure you discuss any questions you have with your health care provider. Document Released: 09/01/2015 Document Revised: 02/26/2016 Document Reviewed: 06/25/2015 Elsevier Interactive Patient Education  2017 [employer].

## 2017-02-10 NOTE — Procedures (Signed)
Interventional Radiology Procedure Note  Procedure: Removal of right femoral portacatheter.  Complications: None  Estimated Blood Loss: None  Recommendations: - DC home  Signed,  Criselda Peaches, MD

## 2017-02-10 NOTE — H&P (Signed)
Referring Physician(s): Heath Lark  Supervising Physician: Jacqulynn Cadet  Patient Status:  [facility] OP  Chief Complaint:  "I'm getting my port out"  Subjective: Patient familiar to IR service from prior gastrostomy tube in 2012 with subsequent exchanges, PICC line placements as well as right common femoral vein Port-A-Cath placement in September 2017. 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 There is no evidence of disease recurrence and he presents today for Port-A-Cath removal.He currently denies fever, headache, chest pain, dyspnea, cough, abdominal pain, or abnormal bleeding. He does have some occasional back pain and intermittent nausea. Past Medical History:  Diagnosis Date  . Anemia   . Arthritis   . Cervical adenopathy 04/26/2012  . Chronic pancreatitis ([place])   . Constipation   . COPD (chronic obstructive pulmonary disease) ([place])   . Daily headache 05/23/2012   "small ones"  . Deficiency anemia 04/15/2016  . G tube feedings ([place])    has a feeding tube in stomach  . History of blood transfusion ~ 2004   "from the pancreatitis"  . History of radiation therapy 02/02/2011-03/22/2011   head/neck,left tonsil  . Hx of radiation therapy 02/02/11 to 03/22/11   L tonsil  . Hypertension   . Hypothyroidism 10/29/2013  . 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 Pancytopenia ([place]) 02/28/2014  . Poor venous access 04/28/2016  . 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 Thrush 11/02/2013  . 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 Urinary hesitancy    Past Surgical History:  Procedure Laterality Date  . BILE DUCT STENT PLACEMENT     hx of  . CHOLECYSTECTOMY  04/2005  . DIRECT LARYNGOSCOPY  05/23/2012   Procedure: DIRECT LARYNGOSCOPY;  Surgeon: Rozetta Nunnery, MD;  Location: [place];  Service: ENT;  Laterality: N/A;  . DIRECT LARYNGOSCOPY N/A 03/23/2013   Procedure: DIRECT LARYNGOSCOPY;  Surgeon: Rozetta Nunnery,  MD;  Location: [place];  Service: ENT;  Laterality: N/A;  . ESOPHAGEAL DILATION N/A 03/23/2013   Procedure: ESOPHAGEAL DILATION;  Surgeon: Rozetta Nunnery, MD;  Location: [place];  Service: ENT;  Laterality: N/A;  . FRACTURE SURGERY    . IR GENERIC HISTORICAL  04/29/2016   IR US GUIDE VASC ACCESS RIGHT 04/29/2016 [facility]  . IR GENERIC HISTORICAL  04/29/2016   IR FLUORO GUIDE CV LINE RIGHT 04/29/2016 [facility]  . IR GENERIC HISTORICAL  05/12/2016   IR US GUIDE VASC ACCESS LEFT 05/12/2016 [facility]  . IR GENERIC HISTORICAL  05/12/2016   IR FLUORO GUIDE CV LINE LEFT 05/12/2016 [facility]  . IR GENERIC HISTORICAL  06/28/2016   IR US GUIDE VASC ACCESS RIGHT 06/28/2016 Sandi Mariscal, MD [facility]  . IR GENERIC HISTORICAL  06/28/2016   IR FLUORO GUIDE PORT INSERTION RIGHT 06/28/2016 Sandi Mariscal, MD [facility]  . MASS BIOPSY  05/23/2012   Procedure: NECK MASS BIOPSY;  Surgeon: Rozetta Nunnery, MD;  Location: [place];  Service: ENT;  Laterality: N/A;  . RADICAL NECK DISSECTION  05/23/2012   w/mass excision  . RADICAL NECK DISSECTION  05/23/2012   Procedure: RADICAL NECK DISSECTION;  Surgeon: Rozetta Nunnery, MD;  Location: [place];  Service: ENT;  Laterality: Left;  . TIBIA FRACTURE SURGERY  1990's   left      Allergies: Patient has no known allergies.  Medications: Prior to Admission medications   Medication Sig Start Date  End Date Taking? Authorizing Provider  amLODipine (NORVASC) 2.5 MG tablet Take 2.5 mg by mouth daily.   Yes [provider]  folic acid (FOLVITE) 1 MG tablet Take 1 mg by mouth every morning.  05/14/15  Yes [provider]  lipase/protease/amylase (CREON-12/PANCREASE) 12000 UNITS CPEP capsule Take 1 capsule by mouth 4 (four) times daily.   Yes [provider]  mirtazapine (REMERON) 30 MG tablet Take 30 mg by mouth at bedtime.   Yes [provider]  nicotine (NICODERM CQ - DOSED IN MG/24  HOURS) 21 mg/24hr patch Place 1 patch (21 mg total) onto the skin daily. 01/27/17  Yes Gorsuch, Ernst Spell, MD  Omega-3 Fatty Acids (FISH OIL PO) Take 1 capsule by mouth every morning.    Yes [provider]  oxyCODONE (ROXICODONE) 15 MG immediate release tablet Take 15 mg by mouth every 4 (four) hours as needed for pain.  06/25/16  Yes [provider]  pantoprazole (PROTONIX) 20 MG tablet Take 1 tablet (20 mg total) by mouth 2 (two) times daily. 10/07/16  Yes Gareth Morgan, MD  PROAIR HFA 108 [phone] Base) MCG/ACT inhaler Inhale 2 puff into lungs every 4-6 hours as needed for shortness of breath/wheezing 02/02/16  Yes [provider]  promethazine (PHENERGAN) 25 MG tablet Take 1 tablet (25 mg total) by mouth every 6 (six) hours as needed for nausea or vomiting. 10/07/16  Yes Gareth Morgan, MD  temazepam (RESTORIL) 30 MG capsule TK 1 C PO QHS PRF SLEEP 12/03/16  Yes [provider]  traMADol (ULTRAM) 50 MG tablet Take 1 tablet (50 mg total) by mouth every 6 (six) hours as needed. Patient taking differently: Take 50 mg by mouth every 6 (six) hours as needed for moderate pain or severe pain.  10/22/16  Yes Levin Erp, PA  doxylamine, Sleep, (UNISOM) 25 MG tablet Take 75 mg by mouth at bedtime as needed for sleep (sleep).     [provider]     Vital Signs:Blood pressure 135/78, pulse rate 75, temperature 97.9, respirations 18    Physical Exam Awake, alert. Chest clear to auscultation bilaterally. Heart with regular rate and rhythm. Abdomen soft, positive bowel sounds, nontender; right femoral Port-A-Cath site nontender; no lower extremity edema  Imaging: No results found.  Labs:  CBC:  Recent Labs  10/07/16 0840 10/20/16 0505 12/09/16 1650 01/27/17 1341  WBC 3.0* 4.0 4.4 4.4  HGB 11.5* 11.8* 12.8* 13.1  HCT 33.3* 33.6* 38.2* 37.7*  PLT 154 204 125* 159    COAGS:  Recent Labs  06/28/16 1205 02/10/17 1209  INR 1.09 1.05     BMP:  Recent Labs  07/11/16 0040 10/07/16 0840 10/20/16 0632 12/09/16 1650  NA 131* 134* 133* 136  K 4.0 3.6 4.0 4.4  CL 94* 99* 96* 97*  CO2 31 28 28  33*  GLUCOSE 106* 103* 90 118*  BUN 10 12 14 10   CALCIUM 8.7* 8.9 8.7* 9.4  CREATININE 0.47* 0.51* 0.36* 0.54*  GFRNONAA >60 >60 >60 >60  GFRAA >60 >60 >60 >60    LIVER FUNCTION TESTS:  Recent Labs  07/11/16 0040 10/07/16 0840 10/20/16 0632 12/09/16 1650  BILITOT 0.4 0.3 0.4 0.4  AST 33 46* 31 27  ALT 25 34 32 23  ALKPHOS 80 77 75 84  PROT 6.5 7.1 7.2 8.1  ALBUMIN 3.6 3.9 3.9 4.3    Assessment and Plan: Patient with history of tonsil cancer initially diagnosed 2012, status post completion of treatment. He presents  today for removal of previously placed right common femoral vein Port-A-Cath. Details/risks of procedure, including but not limited to, internal bleeding, infection, injury to adjacent structures, discussed with patient with his understanding and consent.   Electronically Signed: D. Nash Mantis 02/10/2017, 12:46 PM   I spent a total of 20 minutes at the the patient's bedside AND on the patient's hospital floor or unit, greater than 50% of which was counseling/coordinating care for Port-A-Cath removal

## 2017-02-21 ENCOUNTER — Encounter: Payer: Self-pay | Admitting: Gastroenterology

## 2017-02-22 ENCOUNTER — Encounter (HOSPITAL_COMMUNITY): Payer: Self-pay | Admitting: *Deleted

## 2017-03-01 ENCOUNTER — Ambulatory Visit (HOSPITAL_COMMUNITY): Payer: Medicaid Other | Admitting: Anesthesiology

## 2017-03-01 ENCOUNTER — Ambulatory Visit (HOSPITAL_COMMUNITY)
Admission: RE | Admit: 2017-03-01 | Discharge: 2017-03-01 | Disposition: A | Discharge status: Home / Self Care | Payer: Medicaid Other | Source: Ambulatory Visit | Attending: Gastroenterology | Admitting: Gastroenterology

## 2017-03-01 ENCOUNTER — Encounter (HOSPITAL_COMMUNITY): Payer: Self-pay

## 2017-03-01 ENCOUNTER — Encounter (HOSPITAL_COMMUNITY)
Admission: RE | Disposition: A | Discharge status: Home / Self Care | Payer: Self-pay | Source: Ambulatory Visit | Attending: Gastroenterology

## 2017-03-01 DIAGNOSIS — Z1212 Encounter for screening for malignant neoplasm of rectum: Secondary | ICD-10-CM | POA: Diagnosis not present

## 2017-03-01 DIAGNOSIS — I10 Essential (primary) hypertension: Secondary | ICD-10-CM | POA: Diagnosis not present

## 2017-03-01 DIAGNOSIS — F17200 Nicotine dependence, unspecified, uncomplicated: Secondary | ICD-10-CM | POA: Insufficient documentation

## 2017-03-01 DIAGNOSIS — K449 Diaphragmatic hernia without obstruction or gangrene: Secondary | ICD-10-CM | POA: Diagnosis not present

## 2017-03-01 DIAGNOSIS — K219 Gastro-esophageal reflux disease without esophagitis: Secondary | ICD-10-CM | POA: Diagnosis not present

## 2017-03-01 DIAGNOSIS — K861 Other chronic pancreatitis: Secondary | ICD-10-CM | POA: Insufficient documentation

## 2017-03-01 DIAGNOSIS — Z79899 Other long term (current) drug therapy: Secondary | ICD-10-CM | POA: Insufficient documentation

## 2017-03-01 DIAGNOSIS — Z931 Gastrostomy status: Secondary | ICD-10-CM | POA: Diagnosis not present

## 2017-03-01 DIAGNOSIS — Z85819 Personal history of malignant neoplasm of unspecified site of lip, oral cavity, and pharynx: Secondary | ICD-10-CM | POA: Insufficient documentation

## 2017-03-01 DIAGNOSIS — J449 Chronic obstructive pulmonary disease, unspecified: Secondary | ICD-10-CM | POA: Diagnosis not present

## 2017-03-01 DIAGNOSIS — Z1211 Encounter for screening for malignant neoplasm of colon: Secondary | ICD-10-CM | POA: Insufficient documentation

## 2017-03-01 DIAGNOSIS — R1013 Epigastric pain: Secondary | ICD-10-CM | POA: Diagnosis not present

## 2017-03-01 HISTORY — PX: ESOPHAGOGASTRODUODENOSCOPY (EGD) WITH PROPOFOL: SHX5813

## 2017-03-01 HISTORY — DX: Dorsalgia, unspecified: M54.9

## 2017-03-01 HISTORY — DX: Dyspnea, unspecified: R06.00

## 2017-03-01 HISTORY — PX: FLEXIBLE SIGMOIDOSCOPY: SHX5431

## 2017-03-01 HISTORY — DX: Insomnia, unspecified: G47.00

## 2017-03-01 HISTORY — DX: Other chronic pain: G89.29

## 2017-03-01 HISTORY — DX: Gastro-esophageal reflux disease without esophagitis: K21.9

## 2017-03-01 SURGERY — ESOPHAGOGASTRODUODENOSCOPY (EGD) WITH PROPOFOL
Anesthesia: Monitor Anesthesia Care

## 2017-03-01 MED ORDER — PROPOFOL 10 MG/ML IV BOLUS
INTRAVENOUS | Status: DC | PRN
  Administered 2017-03-01 (×3): 20 mg via INTRAVENOUS

## 2017-03-01 MED ORDER — LIDOCAINE 2% (20 MG/ML) 5 ML SYRINGE
INTRAMUSCULAR | Status: AC
  Filled 2017-03-01: qty 5

## 2017-03-01 MED ORDER — LIDOCAINE 2% (20 MG/ML) 5 ML SYRINGE
INTRAMUSCULAR | Status: DC | PRN
  Administered 2017-03-01: 40 mg via INTRAVENOUS

## 2017-03-01 MED ORDER — LACTATED RINGERS IV SOLN
INTRAVENOUS | Status: DC | PRN
  Administered 2017-03-01: 09:00:00 via INTRAVENOUS

## 2017-03-01 MED ORDER — PROPOFOL 10 MG/ML IV BOLUS
INTRAVENOUS | Status: AC
  Filled 2017-03-01: qty 60

## 2017-03-01 MED ORDER — PROPOFOL 500 MG/50ML IV EMUL
INTRAVENOUS | Status: DC | PRN
  Administered 2017-03-01: 100 ug/kg/min via INTRAVENOUS

## 2017-03-01 SURGICAL SUPPLY — 24 items

## 2017-03-01 NOTE — Anesthesia Procedure Notes (Signed)
Procedure Name: MAC Date/Time: 03/01/2017 9:11 AM Performed by: Dione Booze Pre-anesthesia Checklist: Patient identified, Emergency Drugs available, Suction available and Patient being monitored Patient Re-evaluated:Patient Re-evaluated prior to inductionOxygen Delivery Method: Nasal cannula Placement Confirmation: positive ETCO2

## 2017-03-01 NOTE — H&P (View-Only) (Signed)
Referring Physician(s): Heath Lark  Supervising Physician: Jacqulynn Cadet  Patient Status:  [facility] OP  Chief Complaint:  "I'm getting my port out"  Subjective: Patient familiar to IR service from prior gastrostomy tube in 2012 with subsequent exchanges, PICC line placements as well as right common femoral vein Port-A-Cath placement in September 2017. 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 There is no evidence of disease recurrence and he presents today for Port-A-Cath removal.He currently denies fever, headache, chest pain, dyspnea, cough, abdominal pain, or abnormal bleeding. He does have some occasional back pain and intermittent nausea. Past Medical History:  Diagnosis Date  . Anemia   . Arthritis   . Cervical adenopathy 04/26/2012  . Chronic pancreatitis ([place])   . Constipation   . COPD (chronic obstructive pulmonary disease) ([place])   . Daily headache 05/23/2012   "small ones"  . Deficiency anemia 04/15/2016  . G tube feedings ([place])    has a feeding tube in stomach  . History of blood transfusion ~ 2004   "from the pancreatitis"  . History of radiation therapy 02/02/2011-03/22/2011   head/neck,left tonsil  . Hx of radiation therapy 02/02/11 to 03/22/11   L tonsil  . Hypertension   . Hypothyroidism 10/29/2013  . 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 Pancytopenia ([place]) 02/28/2014  . Poor venous access 04/28/2016  . 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 Thrush 11/02/2013  . 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 Urinary hesitancy    Past Surgical History:  Procedure Laterality Date  . BILE DUCT STENT PLACEMENT     hx of  . CHOLECYSTECTOMY  04/2005  . DIRECT LARYNGOSCOPY  05/23/2012   Procedure: DIRECT LARYNGOSCOPY;  Surgeon: Rozetta Nunnery, MD;  Location: [place];  Service: ENT;  Laterality: N/A;  . DIRECT LARYNGOSCOPY N/A 03/23/2013   Procedure: DIRECT LARYNGOSCOPY;  Surgeon: Rozetta Nunnery,  MD;  Location: [facility];  Service: ENT;  Laterality: N/A;  . ESOPHAGEAL DILATION N/A 03/23/2013   Procedure: ESOPHAGEAL DILATION;  Surgeon: Rozetta Nunnery, MD;  Location: [facility];  Service: ENT;  Laterality: N/A;  . FRACTURE SURGERY    . IR GENERIC HISTORICAL  04/29/2016   IR US GUIDE VASC ACCESS RIGHT 04/29/2016 [facility]  . IR GENERIC HISTORICAL  04/29/2016   IR FLUORO GUIDE CV LINE RIGHT 04/29/2016 [facility]  . IR GENERIC HISTORICAL  05/12/2016   IR US GUIDE VASC ACCESS LEFT 05/12/2016 [facility]  . IR GENERIC HISTORICAL  05/12/2016   IR FLUORO GUIDE CV LINE LEFT 05/12/2016 [facility]  . IR GENERIC HISTORICAL  06/28/2016   IR US GUIDE VASC ACCESS RIGHT 06/28/2016 Sandi Mariscal, MD [facility]  . IR GENERIC HISTORICAL  06/28/2016   IR FLUORO GUIDE PORT INSERTION RIGHT 06/28/2016 Sandi Mariscal, MD [facility]  . MASS BIOPSY  05/23/2012   Procedure: NECK MASS BIOPSY;  Surgeon: Rozetta Nunnery, MD;  Location: [facility];  Service: ENT;  Laterality: N/A;  . RADICAL NECK DISSECTION  05/23/2012   w/mass excision  . RADICAL NECK DISSECTION  05/23/2012   Procedure: RADICAL NECK DISSECTION;  Surgeon: Rozetta Nunnery, MD;  Location: [facility];  Service: ENT;  Laterality: Left;  . TIBIA FRACTURE SURGERY  1990's   left      Allergies: Patient has no known allergies.  Medications: Prior to Admission medications   Medication Sig Start Date  End Date Taking? Authorizing Provider  amLODipine (NORVASC) 2.5 MG tablet Take 2.5 mg by mouth daily.   Yes [provider]  folic acid (FOLVITE) 1 MG tablet Take 1 mg by mouth every morning.  05/14/15  Yes [provider]  lipase/protease/amylase (CREON-12/PANCREASE) 12000 UNITS CPEP capsule Take 1 capsule by mouth 4 (four) times daily.   Yes [provider]  mirtazapine (REMERON) 30 MG tablet Take 30 mg by mouth at bedtime.   Yes [provider]  nicotine (NICODERM CQ - DOSED IN MG/24  HOURS) 21 mg/24hr patch Place 1 patch (21 mg total) onto the skin daily. 01/27/17  Yes Gorsuch, Ernst Spell, MD  Omega-3 Fatty Acids (FISH OIL PO) Take 1 capsule by mouth every morning.    Yes [provider]  oxyCODONE (ROXICODONE) 15 MG immediate release tablet Take 15 mg by mouth every 4 (four) hours as needed for pain.  06/25/16  Yes [provider]  pantoprazole (PROTONIX) 20 MG tablet Take 1 tablet (20 mg total) by mouth 2 (two) times daily. 10/07/16  Yes Gareth Morgan, MD  PROAIR HFA 108 [phone] Base) MCG/ACT inhaler Inhale 2 puff into lungs every 4-6 hours as needed for shortness of breath/wheezing 02/02/16  Yes [provider]  promethazine (PHENERGAN) 25 MG tablet Take 1 tablet (25 mg total) by mouth every 6 (six) hours as needed for nausea or vomiting. 10/07/16  Yes Gareth Morgan, MD  temazepam (RESTORIL) 30 MG capsule TK 1 C PO QHS PRF SLEEP 12/03/16  Yes [provider]  traMADol (ULTRAM) 50 MG tablet Take 1 tablet (50 mg total) by mouth every 6 (six) hours as needed. Patient taking differently: Take 50 mg by mouth every 6 (six) hours as needed for moderate pain or severe pain.  10/22/16  Yes Levin Erp, PA  doxylamine, Sleep, (UNISOM) 25 MG tablet Take 75 mg by mouth at bedtime as needed for sleep (sleep).     [provider]     Vital Signs:Blood pressure 135/78, pulse rate 75, temperature 97.9, respirations 18    Physical Exam Awake, alert. Chest clear to auscultation bilaterally. Heart with regular rate and rhythm. Abdomen soft, positive bowel sounds, nontender; right femoral Port-A-Cath site nontender; no lower extremity edema  Imaging: No results found.  Labs:  CBC:  Recent Labs  10/07/16 0840 10/20/16 0505 12/09/16 1650 01/27/17 1341  WBC 3.0* 4.0 4.4 4.4  HGB 11.5* 11.8* 12.8* 13.1  HCT 33.3* 33.6* 38.2* 37.7*  PLT 154 204 125* 159    COAGS:  Recent Labs  06/28/16 1205 02/10/17 1209  INR 1.09 1.05     BMP:  Recent Labs  07/11/16 0040 10/07/16 0840 10/20/16 0632 12/09/16 1650  NA 131* 134* 133* 136  K 4.0 3.6 4.0 4.4  CL 94* 99* 96* 97*  CO2 31 28 28  33*  GLUCOSE 106* 103* 90 118*  BUN 10 12 14 10   CALCIUM 8.7* 8.9 8.7* 9.4  CREATININE 0.47* 0.51* 0.36* 0.54*  GFRNONAA >60 >60 >60 >60  GFRAA >60 >60 >60 >60    LIVER FUNCTION TESTS:  Recent Labs  07/11/16 0040 10/07/16 0840 10/20/16 0632 12/09/16 1650  BILITOT 0.4 0.3 0.4 0.4  AST 33 46* 31 27  ALT 25 34 32 23  ALKPHOS 80 77 75 84  PROT 6.5 7.1 7.2 8.1  ALBUMIN 3.6 3.9 3.9 4.3    Assessment and Plan: Patient with history of tonsil cancer initially diagnosed 2012, status post completion of treatment. He presents  today for removal of previously placed right common femoral vein Port-A-Cath. Details/risks of procedure, including but not limited to, internal bleeding, infection, injury to adjacent structures, discussed with patient with his understanding and consent.   Electronically Signed: D. Nash Mantis 02/10/2017, 12:46 PM   I spent a total of 20 minutes at the the patient's bedside AND on the patient's hospital floor or unit, greater than 50% of which was counseling/coordinating care for Port-A-Cath removal

## 2017-03-01 NOTE — Op Note (Signed)
[facility] Patient Name: Russell Williamson Procedure Date: 03/01/2017 MRN: [id number] Attending MD: Ladene Artist , MD Date of Birth: 1956-02-04 CSN: [id number] Age: 61 Admit Type: Outpatient Procedure:                Upper GI endoscopy Indications:              Epigastric abdominal pain Providers:                Pricilla Riffle. Fuller Plan, MD, Zenon Mayo, RN, William Dalton, Technician, Alan Mulder, Technician Referring MD:             Heath Lark, MD Medicines:                Monitored Anesthesia Care Complications:            No immediate complications. Estimated Blood Loss:     Estimated blood loss: none. Procedure:                Pre-Anesthesia Assessment:                           - Prior to the procedure, a History and Physical                            was performed, and patient medications and                            allergies were reviewed. The patient's tolerance of                            previous anesthesia was also reviewed. The risks                            and benefits of the procedure and the sedation                            options and risks were discussed with the patient.                            All questions were answered, and informed consent                            was obtained. Prior Anticoagulants: The patient has                            taken no previous anticoagulant or antiplatelet                            agents. ASA Grade Assessment: III - A patient with                            severe systemic disease. After reviewing the risks  and benefits, the patient was deemed in                            satisfactory condition to undergo the procedure.                           - Prior to the procedure, a History and Physical                            was performed, and patient medications and                            allergies were reviewed. The patient's tolerance of                            previous anesthesia was also reviewed. The risks                            and benefits of the procedure and the sedation                            options and risks were discussed with the patient.                            All questions were answered, and informed consent                            was obtained. Prior Anticoagulants: The patient has                            taken no previous anticoagulant or antiplatelet                            agents. ASA Grade Assessment: III - A patient with                            severe systemic disease. After reviewing the risks                            and benefits, the patient was deemed in                            satisfactory condition to undergo the procedure.                           After obtaining informed consent, the endoscope was                            passed under direct vision. Throughout the                            procedure, the patient's blood pressure, pulse, and  oxygen saturations were monitored continuously. The                            was introduced through the mouth, and advanced to                            the second part of duodenum. The upper GI endoscopy                            was accomplished without difficulty. The patient                            tolerated the procedure well. Findings:      The examined esophagus was normal.      A small hiatal hernia was present.      The exam of the stomach was otherwise normal.      The duodenal bulb and second portion of the duodenum were normal. Impression:               - Normal esophagus.                           - Small hiatal hernia.                           - Normal duodenal bulb and second portion of the                            duodenum.                           - No specimens collected. Moderate Sedation:      N/A- Per Anesthesia Care Recommendation:           - Patient has a  contact number available for                            emergencies. The signs and symptoms of potential                            delayed complications were discussed with the                            patient. Return to normal activities tomorrow.                            Written discharge instructions were provided to the                            patient.                           - Resume previous diet.                           - Continue present medications. Procedure Code(s):        --- Professional ---  16109, Esophagogastroduodenoscopy, flexible,                            transoral; diagnostic, including collection of                            specimen(s) by brushing or washing, when performed                            (separate procedure) Diagnosis Code(s):        --- Professional ---                           K44.9, Diaphragmatic hernia without obstruction or                            gangrene                           R10.13, Epigastric pain CPT copyright 2016 American [employer] Association. All rights reserved. The codes documented in this report are preliminary and upon coder review may  be revised to meet current compliance requirements. Ladene Artist, MD 03/01/2017 9:34:53 AM This report has been signed electronically. Number of Addenda: 0

## 2017-03-01 NOTE — Anesthesia Preprocedure Evaluation (Signed)
Anesthesia Evaluation  Patient identified by MRN, date of birth, ID band Patient awake    Reviewed: Allergy & Precautions, NPO status , Patient's Chart, lab work & pertinent test results  Airway Mallampati: II  TM Distance: >3 FB Neck ROM: Full    Dental no notable dental hx.    Pulmonary COPD, Current Smoker,    Pulmonary exam normal breath sounds clear to auscultation       Cardiovascular hypertension, Normal cardiovascular exam Rhythm:Regular Rate:Normal     Neuro/Psych negative neurological ROS  negative psych ROS   GI/Hepatic Neg liver ROS, GERD  ,  Endo/Other  Hypothyroidism   Renal/GU negative Renal ROS  negative genitourinary   Musculoskeletal negative musculoskeletal ROS (+)   Abdominal   Peds negative pediatric ROS (+)  Hematology negative hematology ROS (+)   Anesthesia Other Findings   Reproductive/Obstetrics negative OB ROS                             Anesthesia Physical Anesthesia Plan  ASA: III  Anesthesia Plan: MAC   Post-op Pain Management:    Induction: Intravenous  Airway Management Planned: Nasal Cannula  Additional Equipment:   Intra-op Plan:   Post-operative Plan:   Informed Consent: I have reviewed the patients History and Physical, chart, labs and discussed the procedure including the risks, benefits and alternatives for the proposed anesthesia with the patient or authorized representative who has indicated his/her understanding and acceptance.   Dental advisory given  Plan Discussed with: CRNA and Surgeon  Anesthesia Plan Comments:         Anesthesia Quick Evaluation

## 2017-03-01 NOTE — Op Note (Signed)
[facility] Patient Name: Russell Williamson Procedure Date: 03/01/2017 MRN: [id number] Attending MD: Ladene Artist , MD Date of Birth: 1956-02-27 CSN: [id number] Age: 61 Admit Type: Outpatient Procedure:                Colonoscopy Indications:              Screening for colorectal malignant neoplasm Providers:                Pricilla Riffle. Fuller Plan, MD, Zenon Mayo, RN, Alan Mulder, Technician, William Dalton, Technician Referring MD:             Heath Lark, MD Medicines:                Monitored Anesthesia Care Complications:            No immediate complications. Estimated Blood Loss:     Estimated blood loss: none. Procedure:                Pre-Anesthesia Assessment:                           - Prior to the procedure, a History and Physical                            was performed, and patient medications and                            allergies were reviewed. The patient's tolerance of                            previous anesthesia was also reviewed. The risks                            and benefits of the procedure and the sedation                            options and risks were discussed with the patient.                            All questions were answered, and informed consent                            was obtained. Prior Anticoagulants: The patient has                            taken no previous anticoagulant or antiplatelet                            agents. ASA Grade Assessment: III - A patient with                            severe systemic disease. After reviewing the risks  and benefits, the patient was deemed in                            satisfactory condition to undergo the procedure.                           After obtaining informed consent, the colonoscope                            was passed under direct vision. Throughout the                            procedure, the patient's blood  pressure, pulse, and                            oxygen saturations were monitored continuously. The                            EC-[id number] ([id number]) scope was introduced through                            the anus with the intention of advancing to the                            cecum. The scope was advanced to the sigmoid colon                            before the procedure was aborted. Medications were                            given. The colonoscopy was technically difficult                            and complex due to poor bowel prep. The patient                            tolerated the procedure well. The quality of the                            bowel preparation was poor. The rectum was                            photographed. Scope In: 9:18:54 AM Scope Out: 9:20:43 AM Total Procedure Duration: 0 hours 1 minute 49 seconds  Findings:      The perianal and digital rectal examinations were normal.      The rectum and this distal portion of the sigmoid colon appeared normal.      A large amount of stool was found in the sigmoid colon, precluding       visualization. Lavage of the area was performed however the solid stool       remained. The procedure was aborted at this point. Impression:               - Preparation of the colon was poor.                           -  The rectum and sigmoid colon are normal.                           - Stool in the sigmoid colon.                           - No specimens collected. Moderate Sedation:      N/A- Per Anesthesia Care Recommendation:           - Patient has a contact number available for                            emergencies. The signs and symptoms of potential                            delayed complications were discussed with the                            patient. Return to normal activities tomorrow.                            Written discharge instructions were provided to the                            patient.                            - Resume previous diet.                           - Continue present medications.                           - Repeat colonoscopy at the next available                            appointment for screening purposes if the patient                            is willing and able to complete the standard                            colonoscopy bowel prep. Procedure Code(s):        --- Professional ---                           B2841, 53, Colorectal cancer screening; colonoscopy                            on individual not meeting criteria for high risk Diagnosis Code(s):        --- Professional ---                           Z12.11, Encounter for screening for malignant  neoplasm of colon CPT copyright 2016 American Medical Association. All rights reserved. The codes documented in this report are preliminary and upon coder review may  be revised to meet current compliance requirements. Ladene Artist, MD 03/01/2017 9:31:59 AM This report has been signed electronically. Number of Addenda: 0

## 2017-03-01 NOTE — Discharge Instructions (Signed)
Colonoscopy, Adult, Care After  This sheet gives you information about how to care for yourself after your procedure. Your doctor may also give you more specific instructions. If you have problems or questions, call your doctor.  Follow these instructions at home:  General instructions     · For the first 24 hours after the procedure:  ? Do not drive or use machinery.  ? Do not sign important documents.  ? Do not drink alcohol.  ? Do your daily activities more slowly than normal.  ? Eat foods that are soft and easy to digest.  ? Rest often.  · Take over-the-counter or prescription medicines only as told by your doctor.  · It is up to you to get the results of your procedure. Ask your doctor, or the department performing the procedure, when your results will be ready.  To help cramping and bloating:   · Try walking around.  · Put heat on your belly (abdomen) as told by your doctor. Use a heat source that your doctor recommends, such as a moist heat pack or a heating pad.  ? Put a towel between your skin and the heat source.  ? Leave the heat on for 20-30 minutes.  ? Remove the heat if your skin turns bright red. This is especially important if you cannot feel pain, heat, or cold. You can get burned.  Eating and drinking   · Drink enough fluid to keep your pee (urine) clear or pale yellow.  · Return to your normal diet as told by your doctor. Avoid heavy or fried foods that are hard to digest.  · Avoid drinking alcohol for as long as told by your doctor.  Contact a doctor if:  · You have blood in your poop (stool) 2-3 days after the procedure.  Get help right away if:  · You have more than a small amount of blood in your poop.  · You see large clumps of tissue (blood clots) in your poop.  · Your belly is swollen.  · You feel sick to your stomach (nauseous).  · You throw up (vomit).  · You have a fever.  · You have belly pain that gets worse, and medicine does not help your pain.  This information is not intended to  replace advice given to you by your health care provider. Make sure you discuss any questions you have with your health care provider.  Document Released: 10/23/2010 Document Revised: 06/14/2016 Document Reviewed: 06/14/2016  Elsevier Interactive Patient Education © 2017 [employer]  Esophagogastroduodenoscopy, Care After  Refer to this sheet in the next few weeks. These instructions provide you with information about caring for yourself after your procedure. Your health care provider may also give you more specific instructions. Your treatment has been planned according to current medical practices, but problems sometimes occur. Call your health care provider if you have any problems or questions after your procedure.  What can I expect after the procedure?  After the procedure, it is common to have:  · A sore throat.  · Nausea.  · Bloating.  · Dizziness.  · Fatigue.    Follow these instructions at home:  · Do not eat or drink anything until the numbing medicine (local anesthetic) has worn off and your gag reflex has returned. You will know that the local anesthetic has worn off when you can swallow comfortably.  · Do not drive for 24 hours if you received a medicine to help you   relax (sedative).  · If your health care provider took a tissue sample for testing during the procedure, make sure to get your test results. This is your responsibility. Ask your health care provider or the department performing the test when your results will be ready.  · Keep all follow-up visits as told by your health care provider. This is important.  Contact a health care provider if:  · You cannot stop coughing.  · You are not urinating.  · You are urinating less than usual.  Get help right away if:  · You have trouble swallowing.  · You cannot eat or drink.  · You have throat or chest pain that gets worse.  · You are dizzy or light-headed.  · You faint.  · You have nausea or vomiting.  · You have chills.  · You have a fever.  · You  have severe abdominal pain.  · You have black, tarry, or bloody stools.  This information is not intended to replace advice given to you by your health care provider. Make sure you discuss any questions you have with your health care provider.  Document Released: 09/06/2012 Document Revised: 02/26/2016 Document Reviewed: 08/14/2015  Elsevier Interactive Patient Education © 2017 [employer] Inc[employer]

## 2017-03-01 NOTE — Anesthesia Postprocedure Evaluation (Addendum)
Anesthesia Post Note  Patient: Russell Williamson  Procedure(s) Performed: Procedure(s) (LRB): ESOPHAGOGASTRODUODENOSCOPY (EGD) WITH PROPOFOL (N/A) FLEXIBLE SIGMOIDOSCOPY (N/A)  Patient location during evaluation: PACU Anesthesia Type: MAC Level of consciousness: awake and alert Pain management: pain level controlled Vital Signs Assessment: post-procedure vital signs reviewed and stable Respiratory status: spontaneous breathing, nonlabored ventilation, respiratory function stable and patient connected to nasal cannula oxygen Cardiovascular status: stable and blood pressure returned to baseline Anesthetic complications: no       Last Vitals:  Vitals:   03/01/17 0940 03/01/17 0950  BP: 105/63 118/69  Pulse:    Resp: 11 (!) 9  Temp:      Last Pain:  Vitals:   03/01/17 0937  TempSrc: Oral  PainSc:                  [name] S

## 2017-03-01 NOTE — Transfer of Care (Signed)
Immediate Anesthesia Transfer of Care Note  Patient: Russell Williamson  Procedure(s) Performed: Procedure(s): ESOPHAGOGASTRODUODENOSCOPY (EGD) WITH PROPOFOL (N/A) COLONOSCOPY WITH PROPOFOL (N/A)  Patient Location: PACU and Endoscopy Unit  Anesthesia Type:MAC  Level of Consciousness: awake and patient cooperative  Airway & Oxygen Therapy: Patient Spontanous Breathing and Patient connected to nasal cannula oxygen  Post-op Assessment: Report given to RN and Post -op Vital signs reviewed and stable  Post vital signs: Reviewed and stable  Last Vitals:  Vitals:   03/01/17 0828  BP: 129/67  Pulse: 73  Temp: 36.7 C    Last Pain:  Vitals:   03/01/17 0828  TempSrc: Oral  PainSc: 2          Complications: No apparent anesthesia complications

## 2017-03-01 NOTE — Interval H&P Note (Signed)
History and Physical Interval Note:  03/01/2017 8:32 AM  Russell Williamson  has presented today for surgery, with the diagnosis of screening colonoscopy  epigastric pain  The various methods of treatment have been discussed with the patient and family. After consideration of risks, benefits and other options for treatment, the patient has consented to  Procedure(s): ESOPHAGOGASTRODUODENOSCOPY (EGD) WITH PROPOFOL (N/A) COLONOSCOPY WITH PROPOFOL (N/A) as a surgical intervention .  The patient's history has been reviewed, patient examined, no change in status, stable for surgery.  I have reviewed the patient's chart and labs.  Questions were answered to the patient's satisfaction.     Pricilla Riffle. Fuller Plan

## 2017-03-03 ENCOUNTER — Encounter (HOSPITAL_COMMUNITY): Payer: Self-pay | Admitting: Gastroenterology

## 2017-03-07 ENCOUNTER — Telehealth: Payer: Self-pay

## 2017-03-07 NOTE — Telephone Encounter (Signed)
Patient contacted about scheduling a repeat colonoscopy with more extensive prep.  See colonoscopy report from 03/01/17.  Patient declines to schedule at this time.  He is looking for a place to live and needs assistance.  He asked for contact information for the [employer].  I provided him the numbers from an on line search for the [employer].  He will contact me to arrange a colonoscopy once he has moved and is ready to schedule.  I mailed him a copy of the procedure with the contact information to the office.

## 2017-03-07 NOTE — Addendum Note (Signed)
Addendum  created 03/07/17 1458 by Myrtie Soman, MD   Sign clinical note

## 2017-03-10 ENCOUNTER — Encounter (HOSPITAL_COMMUNITY): Payer: Self-pay | Admitting: Emergency Medicine

## 2017-03-10 ENCOUNTER — Emergency Department (HOSPITAL_COMMUNITY): Payer: Medicaid Other

## 2017-03-10 ENCOUNTER — Emergency Department (HOSPITAL_COMMUNITY)
Admission: EM | Admit: 2017-03-10 | Discharge: 2017-03-10 | Disposition: A | Discharge status: Home / Self Care | Payer: Medicaid Other | Attending: Emergency Medicine | Admitting: Emergency Medicine

## 2017-03-10 DIAGNOSIS — Z85818 Personal history of malignant neoplasm of other sites of lip, oral cavity, and pharynx: Secondary | ICD-10-CM | POA: Insufficient documentation

## 2017-03-10 DIAGNOSIS — F17210 Nicotine dependence, cigarettes, uncomplicated: Secondary | ICD-10-CM | POA: Diagnosis not present

## 2017-03-10 DIAGNOSIS — Z8589 Personal history of malignant neoplasm of other organs and systems: Secondary | ICD-10-CM | POA: Diagnosis not present

## 2017-03-10 DIAGNOSIS — Z79899 Other long term (current) drug therapy: Secondary | ICD-10-CM | POA: Insufficient documentation

## 2017-03-10 DIAGNOSIS — R1011 Right upper quadrant pain: Secondary | ICD-10-CM | POA: Insufficient documentation

## 2017-03-10 DIAGNOSIS — E039 Hypothyroidism, unspecified: Secondary | ICD-10-CM | POA: Diagnosis not present

## 2017-03-10 DIAGNOSIS — I10 Essential (primary) hypertension: Secondary | ICD-10-CM | POA: Insufficient documentation

## 2017-03-10 DIAGNOSIS — J449 Chronic obstructive pulmonary disease, unspecified: Secondary | ICD-10-CM | POA: Insufficient documentation

## 2017-03-10 DIAGNOSIS — R1010 Upper abdominal pain, unspecified: Secondary | ICD-10-CM

## 2017-03-10 LAB — COMPREHENSIVE METABOLIC PANEL
ALT: 17 U/L (ref 17–63)
AST: 30 U/L (ref 15–41)
Albumin: 4.3 g/dL (ref 3.5–5.0)
Alkaline Phosphatase: 74 U/L (ref 38–126)
Anion gap: 10 (ref 5–15)
BUN: 21 mg/dL — AB (ref 6–20)
CO2: 31 mmol/L (ref 22–32)
Calcium: 9.6 mg/dL (ref 8.9–10.3)
Chloride: 95 mmol/L — ABNORMAL LOW (ref 101–111)
Creatinine, Ser: 0.84 mg/dL (ref 0.61–1.24)
GFR calc Af Amer: >60 mL/min (ref >60)
Glucose, Bld: 125 mg/dL — ABNORMAL HIGH (ref 65–99)
POTASSIUM: 4.3 mmol/L (ref 3.5–5.1)
Sodium: 136 mmol/L (ref 135–145)
TOTAL PROTEIN: 8 g/dL (ref 6.5–8.1)
Total Bilirubin: 0.5 mg/dL (ref 0.3–1.2)

## 2017-03-10 LAB — CBC
HEMATOCRIT: 44 % (ref 39.0–52.0)
Hemoglobin: 15.2 g/dL (ref 13.0–17.0)
MCH: 33.6 pg (ref 26.0–34.0)
MCHC: 34.5 g/dL (ref 30.0–36.0)
MCV: 97.3 fL (ref 78.0–100.0)
PLATELETS: 148 10*3/uL — AB (ref 150–400)
RBC: 4.52 MIL/uL (ref 4.22–5.81)
RDW: 14.8 % (ref 11.5–15.5)
WBC: 3.7 10*3/uL — ABNORMAL LOW (ref 4.0–10.5)

## 2017-03-10 LAB — LIPASE, BLOOD: Lipase: 16 U/L (ref 11–51)

## 2017-03-10 MED ORDER — POLYETHYLENE GLYCOL 3350 17 GM/SCOOP PO POWD: 17.0000 g | Freq: Every day | ORAL | 0 refills | Status: DC

## 2017-03-10 MED ORDER — GI COCKTAIL ~~LOC~~
30.0000 mL | Freq: Once | ORAL | Status: AC
  Administered 2017-03-10: 30 mL via ORAL

## 2017-03-10 NOTE — ED Notes (Signed)
Made request for a urine sample,patient unable to provide one this time

## 2017-03-10 NOTE — ED Triage Notes (Signed)
Patient here from home with complaints of increase right sided abdominal pain since procedure. Had a upper GI endoscopy and colonoscopy done on 5/29. Hx of pancreatitis.

## 2017-03-10 NOTE — ED Provider Notes (Signed)
[facility] DEPT Provider Note   CSN: [id number] Arrival date & time: 03/10/17  1010     History   Chief Complaint Chief Complaint  Patient presents with  . Abdominal Pain    HPI Russell Williamson is a 61 y.o. male.  HPI Patient presents to the emergency Department with complaints of upper abdominal and right sided discomfort over the past several days. He underwent colonoscopy and endoscopy on 529. He has a history of recurrent pancreatitis. He states this feels like his prior episodes of acute on chronic pancreatitis. Reports nausea without vomiting. Denies diarrhea. Pain is moderate in severity at this time. It is not controlled with his home Roxicodone. No fevers or chills. No other complaints   Past Medical History:  Diagnosis Date  . Anemia   . Arthritis   . Cervical adenopathy 04/26/2012  . Chronic back pain    lower  . Chronic pancreatitis ([place])   . Constipation   . COPD (chronic obstructive pulmonary disease) ([place])   . Daily headache 05/23/2012   "small ones"  . Deficiency anemia 04/15/2016  . Dyspnea    with exertion  . G tube feedings ([facility])    hx of  feeding tube in stomach  . GERD (gastroesophageal reflux disease)   . History of blood transfusion ~ 2004   "from the pancreatitis"  . History of radiation therapy 02/02/2011-03/22/2011   head/neck,left tonsil  . Hx of radiation therapy 02/02/11 to 03/22/11   L tonsil  . Hypertension   . Insomnia   . 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 Pancytopenia ([place]) 02/28/2014  . Poor venous access 04/28/2016  . Seizures ([place])    alcohol-related last seizure 3 yrs ago  . Thrush 11/02/2013  . 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 Urinary hesitancy     Patient Active Problem List   Diagnosis Date Noted  . Abdominal pain, epigastric   . COPD exacerbation ([place]) 01/27/2017  . Oral thrush 01/27/2017  . 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 PICC (peripherally inserted central catheter)  flush 05/07/2016  . Port catheter in place 05/05/2016  . Poor venous access 04/28/2016  . Weight loss 04/16/2016  . Deficiency anemia 04/15/2016  . Chronic leukopenia 04/19/2015  . Anemia in chronic illness 04/19/2015  . Weight loss, unintentional 04/19/2015  . Relapsing chronic pancreatitis ([place]) 01/17/2015  . Diarrhea 09/04/2014  . 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 Malnutrition of moderate degree ([place]) 09/03/2014  . Hypokalemia 09/02/2014  . Pancreatitis 09/01/2014  . Chronic alcoholic pancreatitis ([place]) 09/01/2014  . Mucositis 06/17/2014  . Screening for colorectal cancer 06/17/2014  . Chronic neck pain 06/17/2014  . S/P gastrostomy ([place]) 06/17/2014  . Nicotine abuse 02/28/2014  . Pancytopenia ([place]) 02/28/2014  . Hypothyroidism (acquired) 11/02/2013  . Hypothyroidism 10/29/2013  . 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 Cervical adenopathy 04/26/2012  . Hiatal hernia   . Hx of radiation therapy   . HTN (hypertension) 10/16/2011  . History of cancer tonsil 08/20/2011  . Chronic pancreatitis [facility])     Past Surgical History:  Procedure Laterality Date  . BILE DUCT STENT PLACEMENT     hx of  . CHOLECYSTECTOMY  04/2005  . DIRECT LARYNGOSCOPY  05/23/2012   Procedure: DIRECT LARYNGOSCOPY;  Surgeon: Rozetta Nunnery, MD;  Location: [facility];  Service: ENT;  Laterality: N/A;  . DIRECT LARYNGOSCOPY N/A 03/23/2013   Procedure: DIRECT LARYNGOSCOPY;  Surgeon: Rozetta Nunnery, MD;  Location: [facility];  Service:  ENT;  Laterality: N/A;  . ESOPHAGEAL DILATION N/A 03/23/2013   Procedure: ESOPHAGEAL DILATION;  Surgeon: Rozetta Nunnery, MD;  Location: [facility];  Service: ENT;  Laterality: N/A;  . ESOPHAGOGASTRODUODENOSCOPY (EGD) WITH PROPOFOL N/A 03/01/2017   Procedure: ESOPHAGOGASTRODUODENOSCOPY (EGD) WITH PROPOFOL;  Surgeon: Ladene Artist, MD;  Location: [facility];  Service: Endoscopy;  Laterality: N/A;  . FLEXIBLE SIGMOIDOSCOPY N/A 03/01/2017   Procedure: FLEXIBLE  SIGMOIDOSCOPY;  Surgeon: Ladene Artist, MD;  Location: [facility];  Service: Endoscopy;  Laterality: N/A;  . IR GENERIC HISTORICAL  04/29/2016   IR US GUIDE VASC ACCESS RIGHT 04/29/2016 [facility]  . IR GENERIC HISTORICAL  04/29/2016   IR FLUORO GUIDE CV LINE RIGHT 04/29/2016 [facility]  . IR GENERIC HISTORICAL  05/12/2016   IR US GUIDE VASC ACCESS LEFT 05/12/2016 [facility]  . IR GENERIC HISTORICAL  05/12/2016   IR FLUORO GUIDE CV LINE LEFT 05/12/2016 [facility]  . IR GENERIC HISTORICAL  06/28/2016   IR US GUIDE VASC ACCESS RIGHT 06/28/2016 Sandi Mariscal, MD [facility]  . IR GENERIC HISTORICAL  06/28/2016   IR FLUORO GUIDE PORT INSERTION RIGHT 06/28/2016 Sandi Mariscal, MD [facility]  . IR REMOVAL TUN ACCESS W/ PORT W/O FL MOD SED  02/10/2017  . MASS BIOPSY  05/23/2012   Procedure: NECK MASS BIOPSY;  Surgeon: Rozetta Nunnery, MD;  Location: [facility];  Service: ENT;  Laterality: N/A;  . RADICAL NECK DISSECTION  05/23/2012   w/mass excision  . RADICAL NECK DISSECTION  05/23/2012   Procedure: RADICAL NECK DISSECTION;  Surgeon: Rozetta Nunnery, MD;  Location: [facility];  Service: ENT;  Laterality: Left;  . TIBIA FRACTURE SURGERY  1990's   left       Home Medications    Prior to Admission medications   Medication Sig Start Date End Date Taking? Authorizing Provider  amLODipine (NORVASC) 2.5 MG tablet Take 2.5 mg by mouth daily. 12/24/16  Yes [provider]  doxylamine, Sleep, (UNISOM) 25 MG tablet Take 50-75 mg by mouth at bedtime as needed for sleep (sleep).    Yes [provider]  folic acid (FOLVITE) 1 MG tablet Take 1 mg by mouth daily.  05/14/15  Yes [provider]  lipase/protease/amylase (CREON-12/PANCREASE) 12000 UNITS CPEP capsule Take 1 capsule by mouth 4 (four) times daily.   Yes [provider]  mirtazapine (REMERON) 30 MG tablet Take 30 mg by mouth at bedtime.   Yes [provider]  Omega-3 Fatty Acids (OMEGA-3 FISH OIL) 300 MG  CAPS Take 300 mg by mouth daily.   Yes [provider]  oxyCODONE (ROXICODONE) 15 MG immediate release tablet Take 15 mg by mouth every 4 (four) hours as needed for pain.  06/25/16  Yes [provider]  pantoprazole (PROTONIX) 20 MG tablet Take 1 tablet (20 mg total) by mouth 2 (two) times daily. 10/07/16  Yes Gareth Morgan, MD  PROAIR HFA 108 [phone] Base) MCG/ACT inhaler Inhale 2 puff into lungs every 4-6 hours as needed for shortness of breath/wheezing 02/02/16  Yes [provider]  temazepam (RESTORIL) 30 MG capsule TAKE 1 CAPSULE ( 30 MG ) BY MOUTH EVERY NIGHT 12/03/16  Yes [provider]  traMADol (ULTRAM) 50 MG tablet Take 1 tablet (50 mg total) by mouth every 6 (six) hours as needed. Patient taking differently: Take 50 mg by mouth every 6 (six) hours as needed for moderate pain or severe pain.  10/22/16  Yes Lemmon,  Lavone Nian, PA  nicotine (NICODERM CQ - DOSED IN MG/24 HOURS) 21 mg/24hr patch Place 1 patch (21 mg total) onto the skin daily. Patient not taking: Reported on 03/10/2017 01/27/17   Heath Lark, MD  polyethylene glycol powder (MIRALAX) powder Take 17 g by mouth daily. 03/10/17   Jola Schmidt, MD  promethazine (PHENERGAN) 25 MG tablet Take 1 tablet (25 mg total) by mouth every 6 (six) hours as needed for nausea or vomiting. Patient not taking: Reported on 03/10/2017 10/07/16   Gareth Morgan, MD    Family History Family History  Problem Relation Age of Onset  . COPD Mother   . Colon cancer Neg Hx     Social History Social History  Substance Use Topics  . Smoking status: Current Every Day Smoker    Packs/day: 0.25    Years: 40.00    Types: Cigarettes  . Smokeless tobacco: Never Used     Comment: hx 1/2 -1 PPD  . Alcohol use No     Comment: 05/23/2012 hx alcohol abuse, quit ~ 2007     Allergies   Patient has no known allergies.   Review of Systems Review of Systems  All other systems reviewed and are negative.    Physical  Exam Updated Vital Signs BP (!) 104/57 (BP Location: Left Arm)   Pulse 94   Temp 98.1 F (36.7 C) (Oral)   Resp 14   Ht 5\' 6"  (1.676 m)   Wt 45.4 kg (100 lb)   SpO2 95%   BMI 16.14 kg/m   Physical Exam  Constitutional: He is oriented to person, place, and time.  HENT:  Head: Normocephalic and atraumatic.  Eyes: EOM are normal.  Neck: Normal range of motion.  Cardiovascular: Normal rate, regular rhythm and intact distal pulses.   Pulmonary/Chest: Effort normal and breath sounds normal. No respiratory distress.  Abdominal: He exhibits no distension. There is no tenderness.  Musculoskeletal: Normal range of motion.  Neurological: He is alert and oriented to person, place, and time.  Skin: Skin is warm and dry.  Nursing note and vitals reviewed.    ED Treatments / Results  Labs (all labs ordered are listed, but only abnormal results are displayed) Labs Reviewed  COMPREHENSIVE METABOLIC PANEL - Abnormal; Notable for the following:       Result Value   Chloride 95 (*)    Glucose, Bld 125 (*)    BUN 21 (*)    All other components within normal limits  CBC - Abnormal; Notable for the following:    WBC 3.7 (*)    Platelets 148 (*)    All other components within normal limits  LIPASE, BLOOD  URINALYSIS, ROUTINE W REFLEX MICROSCOPIC    EKG  EKG Interpretation None       Radiology Dg Abd 2 Views  Result Date: 03/10/2017 CLINICAL DATA:  Increased right-sided abdominal pain since endoscopy and colonoscopy on Mar 01, 2017. Patient has history of pancreatitis. EXAM: ABDOMEN - 2 VIEW COMPARISON:  Abdominal and pelvic CT scan dated July 11, 2016 FINDINGS: The colonic stool and gas pattern is mildly increased. No free extraluminal gas collections are observed. There surgical clips in the gallbladder fossa. There is calcification in the wall of the abdominal aorta. The bony structures exhibit no acute abnormalities. IMPRESSION: Increase colonic stool burden suggests constipation  in the appropriate clinical setting. No evidence of ileus or obstruction. Abdominal aortic atherosclerosis. Electronically Signed   By: David  Martinique M.D.   On: 03/10/2017  12:45    Procedures Procedures (including critical care time)  Medications Ordered in ED Medications  gi cocktail (Maalox,Lidocaine,Donnatal) (30 mLs Oral Given 03/10/17 1253)     Initial Impression / Assessment and Plan / ED Course  I have reviewed the triage vital signs and the nursing notes.  Pertinent labs & imaging results that were available during my care of the patient were reviewed by me and considered in my medical decision making (see chart for details).     No significant tenderness on examination. Labs and x-ray without abnormality. Nonobstructive bowel gas. No free air. Overall well-appearing. X-ray does demonstrate a large stool burden. This could represent constipation. Patient will be discharged home with a prescription for MiraLAX.  Final Clinical Impressions(s) / ED Diagnoses   Final diagnoses:  Upper abdominal pain    New Prescriptions New Prescriptions   POLYETHYLENE GLYCOL POWDER (MIRALAX) POWDER    Take 17 g by mouth daily.     Jola Schmidt, MD 03/10/17 [phone]

## 2017-04-13 ENCOUNTER — Emergency Department (HOSPITAL_COMMUNITY): Payer: Medicaid Other

## 2017-04-13 ENCOUNTER — Encounter (HOSPITAL_COMMUNITY): Payer: Self-pay | Admitting: Emergency Medicine

## 2017-04-13 DIAGNOSIS — D649 Anemia, unspecified: Secondary | ICD-10-CM | POA: Insufficient documentation

## 2017-04-13 DIAGNOSIS — Z79899 Other long term (current) drug therapy: Secondary | ICD-10-CM | POA: Diagnosis not present

## 2017-04-13 DIAGNOSIS — K861 Other chronic pancreatitis: Secondary | ICD-10-CM | POA: Insufficient documentation

## 2017-04-13 DIAGNOSIS — J449 Chronic obstructive pulmonary disease, unspecified: Secondary | ICD-10-CM | POA: Diagnosis not present

## 2017-04-13 DIAGNOSIS — F17210 Nicotine dependence, cigarettes, uncomplicated: Secondary | ICD-10-CM | POA: Insufficient documentation

## 2017-04-13 DIAGNOSIS — Z22322 Carrier or suspected carrier of Methicillin resistant Staphylococcus aureus: Secondary | ICD-10-CM | POA: Insufficient documentation

## 2017-04-13 DIAGNOSIS — R0789 Other chest pain: Secondary | ICD-10-CM | POA: Diagnosis not present

## 2017-04-13 DIAGNOSIS — Z85818 Personal history of malignant neoplasm of other sites of lip, oral cavity, and pharynx: Secondary | ICD-10-CM | POA: Diagnosis not present

## 2017-04-13 DIAGNOSIS — R05 Cough: Secondary | ICD-10-CM | POA: Diagnosis not present

## 2017-04-13 DIAGNOSIS — R070 Pain in throat: Secondary | ICD-10-CM | POA: Diagnosis present

## 2017-04-13 LAB — CBC
HEMATOCRIT: 35.9 % — AB (ref 39.0–52.0)
HEMOGLOBIN: 12.2 g/dL — AB (ref 13.0–17.0)
MCH: 32.4 pg (ref 26.0–34.0)
MCHC: 34 g/dL (ref 30.0–36.0)
MCV: 95.2 fL (ref 78.0–100.0)
Platelets: 160 10*3/uL (ref 150–400)
RBC: 3.77 MIL/uL — ABNORMAL LOW (ref 4.22–5.81)
RDW: 15.8 % — AB (ref 11.5–15.5)
WBC: 3.8 10*3/uL — AB (ref 4.0–10.5)

## 2017-04-13 LAB — BASIC METABOLIC PANEL
ANION GAP: 8 (ref 5–15)
BUN: 17 mg/dL (ref 6–20)
CALCIUM: 9.3 mg/dL (ref 8.9–10.3)
CO2: 31 mmol/L (ref 22–32)
Chloride: 99 mmol/L — ABNORMAL LOW (ref 101–111)
Creatinine, Ser: 0.55 mg/dL — ABNORMAL LOW (ref 0.61–1.24)
GFR calc Af Amer: >60 mL/min (ref >60)
Glucose, Bld: 112 mg/dL — ABNORMAL HIGH (ref 65–99)
POTASSIUM: 3.6 mmol/L (ref 3.5–5.1)
SODIUM: 138 mmol/L (ref 135–145)

## 2017-04-13 LAB — POCT I-STAT TROPONIN I: TROPONIN I, POC: 0.01 ng/mL (ref 0.00–0.08)

## 2017-04-13 NOTE — ED Triage Notes (Signed)
Pt comes in with complaints of centralized chest pain and throat irritation after a pill (probably tylenol per pt) got stuck this afternoon.  Pt in remission for throat cancer and does not go back until April for another scan.  Pt also reports he has a hx of pancreatitis but denies alcohol use.  Does endorse smoking 3-4 cigarettes a day.

## 2017-04-14 ENCOUNTER — Emergency Department (HOSPITAL_COMMUNITY)
Admission: EM | Admit: 2017-04-14 | Discharge: 2017-04-14 | Disposition: A | Discharge status: Home / Self Care | Payer: Medicaid Other | Attending: Emergency Medicine | Admitting: Emergency Medicine

## 2017-04-14 DIAGNOSIS — J449 Chronic obstructive pulmonary disease, unspecified: Secondary | ICD-10-CM

## 2017-04-14 DIAGNOSIS — K861 Other chronic pancreatitis: Secondary | ICD-10-CM

## 2017-04-14 LAB — LIPASE, BLOOD: Lipase: <10 U/L — ABNORMAL LOW (ref 11–51)

## 2017-04-14 LAB — POCT I-STAT TROPONIN I: Troponin i, poc: 0 ng/mL (ref 0.00–0.08)

## 2017-04-14 MED ORDER — DEXAMETHASONE 4 MG PO TABS
10.0000 mg | ORAL_TABLET | Freq: Once | ORAL | Status: AC
  Administered 2017-04-14: 10 mg via ORAL
  Filled 2017-04-14: qty 2

## 2017-04-14 MED ORDER — ONDANSETRON 4 MG PO TBDP: 4.0000 mg | ORAL_TABLET | Freq: Three times a day (TID) | ORAL | 0 refills | Status: DC | PRN

## 2017-04-14 MED ORDER — HYDROMORPHONE HCL 1 MG/ML IJ SOLN
1.0000 mg | Freq: Once | INTRAMUSCULAR | Status: AC
Start: 2017-04-14 — End: 2017-04-14
  Administered 2017-04-14: 1 mg via INTRAVENOUS
  Filled 2017-04-14: qty 1

## 2017-04-14 MED ORDER — OXYCODONE HCL ER 10 MG PO T12A: 10.0000 mg | EXTENDED_RELEASE_TABLET | Freq: Two times a day (BID) | ORAL | Status: DC

## 2017-04-14 MED ORDER — HYDROCODONE-ACETAMINOPHEN 5-325 MG PO TABS: 1.0000 | ORAL_TABLET | Freq: Four times a day (QID) | ORAL | 0 refills | Status: DC | PRN

## 2017-04-14 MED ORDER — HYDROCODONE-ACETAMINOPHEN 5-325 MG PO TABS
1.0000 | ORAL_TABLET | Freq: Once | ORAL | Status: AC
  Administered 2017-04-14: 1 via ORAL
  Filled 2017-04-14: qty 1

## 2017-04-14 NOTE — ED Provider Notes (Signed)
[facility] pharmacy called to notify that on 6/22  patient filled  #180 30 day of 15 mg oxycodone IR. This is a prescription that he has recurrently. Patient advised the pharmacist that his medications had been stolen. Pharmacy was calling to determine if it was still okay to fill Vicodin prescribed this morning from the emergency department. At this time, based on this information I have advised not to fill additional hydrocodone and the patient must immediately call his provider to notify of the loss of the prescription and any pain medications they might feel are indicated.   Charlesetta Shanks, MD 04/14/17 1137

## 2017-04-14 NOTE — Discharge Instructions (Signed)
We saw you in the ER for the abdominal pain. All of our results are normal, including all labs and imaging. Kidney function is fine as well. We think you have pancreatitis flar up, and recommend that you see your primary care doctor within 2-3 days for further evaluation. If your symptoms get worse, return to the ER. Take the pain meds and nausea meds as prescribed.

## 2017-04-14 NOTE — ED Provider Notes (Signed)
[facility] DEPT Provider Note   CSN: [id number] Arrival date & time: 04/13/17  2202  By signing my name below, I, Collene Leyden, attest that this documentation has been prepared under the direction and in the presence of Varney Biles, MD. Electronically Signed: Collene Leyden, Scribe. 04/14/17. 1:03 AM.  History   Chief Complaint Chief Complaint  Patient presents with  . Pill Stuck in Throat  . Chest Pain    HPI Comments: Russell Williamson is a 61 y.o. male with a history of COPD, G tube feedings, and tonsillar cancer, who presents to the Emergency Department complaining of "pancreas" pain that began 4 days ago. Patient states he has pancreatitis for the past 15 years. Patient reports developing pancreatitis flare ups once a month. Patient denies any alcohol use, last alcoholic beverage was 4259. Patient denies any fever, chills, nausea, vomiting, or any additional symptoms.   Patient is also complaining of persistent centralized chest pain that began this afternoon. Patient reports developing chest pain and throat irritation after attempting to swallow a pill this afternoon. 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 Patient repots associated chest congestion with clear sputum. No medications taken prior to arrival. Patient reports smoking 2-3 cigarettes daily. Patient denies any additional symptoms. Pt has no wheezing and he reports that the cough or phlegm are not new.  The history is provided by the patient. No language interpreter was used.    Past Medical History:  Diagnosis Date  . Anemia   . Arthritis   . Cervical adenopathy 04/26/2012  . Chronic back pain    lower  . Chronic pancreatitis ([place])   . Constipation   . COPD (chronic obstructive pulmonary disease) ([place])   . Daily headache 05/23/2012   "small ones"  . Deficiency anemia 04/15/2016  . Dyspnea    with exertion  . G tube feedings (H[facility])    hx of  feeding  tube in stomach  . GERD (gastroesophageal reflux disease)   . History of blood transfusion ~ 2004   "from the pancreatitis"  . History of radiation therapy 02/02/2011-03/22/2011   head/neck,left tonsil  . Hx of radiation therapy 02/02/11 to 03/22/11   L tonsil  . Hypertension   . Insomnia   . 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 Pancytopenia ([place]) 02/28/2014  . Poor venous access 04/28/2016  . Seizures ([place])    alcohol-related last seizure 3 yrs ago  . Thrush 11/02/2013  . 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 Urinary hesitancy     Patient Active Problem List   Diagnosis Date Noted  . Abdominal pain, epigastric   . COPD exacerbation ([place]) 01/27/2017  . Oral thrush 01/27/2017  . 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 PICC (peripherally inserted central catheter) flush 05/07/2016  . Port catheter in place 05/05/2016  . Poor venous access 04/28/2016  . Weight loss 04/16/2016  . Deficiency anemia 04/15/2016  . Chronic leukopenia 04/19/2015  . Anemia in chronic illness 04/19/2015  . Weight loss, unintentional 04/19/2015  . Relapsing chronic pancreatitis ([place]) 01/17/2015  . Diarrhea 09/04/2014  . 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 Malnutrition of moderate degree ([place]) 09/03/2014  . Hypokalemia 09/02/2014  . Pancreatitis 09/01/2014  . Chronic alcoholic pancreatitis ([place]) 09/01/2014  . Mucositis 06/17/2014  . Screening for colorectal cancer 06/17/2014  . Chronic neck pain 06/17/2014  . S/P gastrostomy ([place]) 06/17/2014  . Nicotine abuse 02/28/2014  .  Pancytopenia ([place]) 02/28/2014  . Hypothyroidism (acquired) 11/02/2013  . Hypothyroidism 10/29/2013  . 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 Cervical adenopathy 04/26/2012  . Hiatal hernia   . Hx of radiation therapy   . HTN (hypertension) 10/16/2011  . History of cancer tonsil 08/20/2011  . Chronic pancreatitis [facility])     Past Surgical History:  Procedure Laterality Date  . BILE DUCT STENT PLACEMENT     hx of  .  CHOLECYSTECTOMY  04/2005  . DIRECT LARYNGOSCOPY  05/23/2012   Procedure: DIRECT LARYNGOSCOPY;  Surgeon: Rozetta Nunnery, MD;  Location: [facility];  Service: ENT;  Laterality: N/A;  . DIRECT LARYNGOSCOPY N/A 03/23/2013   Procedure: DIRECT LARYNGOSCOPY;  Surgeon: Rozetta Nunnery, MD;  Location: [facility];  Service: ENT;  Laterality: N/A;  . ESOPHAGEAL DILATION N/A 03/23/2013   Procedure: ESOPHAGEAL DILATION;  Surgeon: Rozetta Nunnery, MD;  Location: [facility];  Service: ENT;  Laterality: N/A;  . ESOPHAGOGASTRODUODENOSCOPY (EGD) WITH PROPOFOL N/A 03/01/2017   Procedure: ESOPHAGOGASTRODUODENOSCOPY (EGD) WITH PROPOFOL;  Surgeon: Ladene Artist, MD;  Location[facility];  Service: Endoscopy;  Laterality: N/A;  . FLEXIBLE SIGMOIDOSCOPY N/A 03/01/2017   Procedure: FLEXIBLE SIGMOIDOSCOPY;  Surgeon: Ladene Artist, MD;  Location[facility];  Service: Endoscopy;  Laterality: N/A;  . IR GENERIC HISTORICAL  04/29/2016   IR US GUIDE VASC ACCESS RIGHT 04/29/2016 [facility]  . IR GENERIC HISTORICAL  04/29/2016   IR FLUORO GUIDE CV LINE RIGHT 04/29/2016 [facility]  . IR GENERIC HISTORICAL  05/12/2016   IR US GUIDE VASC ACCESS LEFT 05/12/2016 [facility]  . IR GENERIC HISTORICAL  05/12/2016   IR FLUORO GUIDE CV LINE LEFT 05/12/2016 [facility]  . IR GENERIC HISTORICAL  06/28/2016   IR US GUIDE VASC ACCESS RIGHT 06/28/2016 Sandi Mariscal, MD [facility]  . IR GENERIC HISTORICAL  06/28/2016   IR FLUORO GUIDE PORT INSERTION RIGHT 06/28/2016 Sandi Mariscal, MD [facility]  . IR REMOVAL TUN ACCESS W/ PORT W/O FL MOD SED  02/10/2017  . MASS BIOPSY  05/23/2012   Procedure: NECK MASS BIOPSY;  Surgeon: Rozetta Nunnery, MD;  Location: [facility];  Service: ENT;  Laterality: N/A;  . RADICAL NECK DISSECTION  05/23/2012   w/mass excision  . RADICAL NECK DISSECTION  05/23/2012   Procedure: RADICAL NECK DISSECTION;  Surgeon: Rozetta Nunnery, MD;  Location: [facility];  Service: ENT;   Laterality: Left;  . TIBIA FRACTURE SURGERY  1990's   left       Home Medications    Prior to Admission medications   Medication Sig Start Date End Date Taking? Authorizing Provider  amLODipine (NORVASC) 2.5 MG tablet Take 2.5 mg by mouth daily. 12/24/16  Yes [provider]  doxylamine, Sleep, (UNISOM) 25 MG tablet Take 50-75 mg by mouth at bedtime as needed for sleep (sleep).    Yes [provider]  folic acid (FOLVITE) 1 MG tablet Take 1 mg by mouth daily.  05/14/15  Yes [provider]  lipase/protease/amylase (CREON-12/PANCREASE) 12000 UNITS CPEP capsule Take 1 capsule by mouth 4 (four) times daily.   Yes [provider]  mirtazapine (REMERON) 30 MG tablet Take 30 mg by mouth at bedtime.   Yes [provider]  Omega-3 Fatty Acids (OMEGA-3 FISH OIL) 300 MG CAPS Take 300 mg by mouth daily.   Yes [provider]  oxyCODONE (ROXICODONE) 15 MG immediate release tablet Take 15 mg by mouth every 4 (four) hours as  needed for pain.  06/25/16  Yes [provider]  pantoprazole (PROTONIX) 20 MG tablet Take 1 tablet (20 mg total) by mouth 2 (two) times daily. 10/07/16  Yes Gareth Morgan, MD  polyethylene glycol powder (MIRALAX) powder Take 17 g by mouth daily. 03/10/17  Yes Jola Schmidt, MD  PROAIR HFA 108 [phone] Base) MCG/ACT inhaler Inhale 2 puff into lungs every 4-6 hours as needed for shortness of breath/wheezing 02/02/16  Yes [provider]  temazepam (RESTORIL) 30 MG capsule TAKE 1 CAPSULE ( 30 MG ) BY MOUTH EVERY NIGHT 12/03/16  Yes [provider]  traMADol (ULTRAM) 50 MG tablet Take 1 tablet (50 mg total) by mouth every 6 (six) hours as needed. Patient taking differently: Take 50 mg by mouth every 6 (six) hours as needed for moderate pain or severe pain.  10/22/16  Yes Levin Erp, PA  HYDROcodone-acetaminophen (NORCO/VICODIN) 5-325 MG tablet Take 1 tablet by mouth every 6 (six) hours as needed. 04/14/17    Varney Biles, MD  nicotine (NICODERM CQ - DOSED IN MG/24 HOURS) 21 mg/24hr patch Place 1 patch (21 mg total) onto the skin daily. Patient not taking: Reported on 03/10/2017 01/27/17   Heath Lark, MD  ondansetron (ZOFRAN ODT) 4 MG disintegrating tablet Take 1 tablet (4 mg total) by mouth every 8 (eight) hours as needed for nausea or vomiting. 04/14/17   Varney Biles, MD  promethazine (PHENERGAN) 25 MG tablet Take 1 tablet (25 mg total) by mouth every 6 (six) hours as needed for nausea or vomiting. Patient not taking: Reported on 03/10/2017 10/07/16   Gareth Morgan, MD    Family History Family History  Problem Relation Age of Onset  . COPD Mother   . Colon cancer Neg Hx     Social History Social History  Substance Use Topics  . Smoking status: Current Every Day Smoker    Packs/day: 0.25    Years: 40.00    Types: Cigarettes  . Smokeless tobacco: Never Used     Comment: hx 1/2 -1 PPD  . Alcohol use No     Comment: 05/23/2012 hx alcohol abuse, quit ~ 2007     Allergies   Patient has no known allergies.   Review of Systems Review of Systems  Constitutional: Negative for chills and fever.  HENT: Positive for congestion.   Respiratory: Negative for shortness of breath.   Cardiovascular: Positive for chest pain.  Gastrointestinal: Positive for abdominal pain. Negative for nausea and vomiting.  All other systems reviewed and are negative.    Physical Exam Updated Vital Signs BP 138/80 (BP Location: Right Arm)   Pulse (!) 59   Temp 98.1 F (36.7 C) (Oral)   Resp 12   Ht 5\' 6"  (1.676 m)   Wt 43.1 kg (95 lb)   SpO2 97%   BMI 15.33 kg/m   Physical Exam  Constitutional: He is oriented to person, place, and time. He appears well-developed.  HENT:  Head: Normocephalic and atraumatic.  Mouth/Throat: Oropharynx is clear and moist.  Eyes: Conjunctivae and EOM are normal. Pupils are equal, round, and reactive to light.  Neck: Normal range of motion. Neck supple.    Cardiovascular: Normal rate and regular rhythm.   Pulmonary/Chest: Effort normal. He has no wheezes.  Rhonchorous  breath sounds at the lung bases. No wheezing appreciated.   Abdominal: Soft. Bowel sounds are normal. There is tenderness.  Generalized upper quadrant tenderness, worse in epigastric region.   Neurological: He is alert and oriented  to person, place, and time.  Skin: Skin is warm and dry.  Nursing note and vitals reviewed.    ED Treatments / Results  DIAGNOSTIC STUDIES: Oxygen Saturation is 91% on RA, low by my interpretation.    COORDINATION OF CARE: 1:03 AM Discussed treatment plan with pt at bedside and pt agreed to plan.   Labs (all labs ordered are listed, but only abnormal results are displayed) Labs Reviewed  BASIC METABOLIC PANEL - Abnormal; Notable for the following:       Result Value   Chloride 99 (*)    Glucose, Bld 112 (*)    Creatinine, Ser 0.55 (*)    All other components within normal limits  CBC - Abnormal; Notable for the following:    WBC 3.8 (*)    RBC 3.77 (*)    Hemoglobin 12.2 (*)    HCT 35.9 (*)    RDW 15.8 (*)    All other components within normal limits  LIPASE, BLOOD - Abnormal; Notable for the following:    Lipase <10 (*)    All other components within normal limits  I-STAT TROPOININ, ED  POCT I-STAT TROPONIN I  I-STAT TROPOININ, ED  POCT I-STAT TROPONIN I    EKG  EKG Interpretation  Date/Time:  Wednesday April 13 2017 22:35:43 EDT Ventricular Rate:  71 PR Interval:    QRS Duration: 72 QT Interval:  405 QTC Calculation: 441 R Axis:   89 Text Interpretation:  Sinus rhythm Anterior infarct, old No acute changes No significant change since last tracing Confirmed by Varney Biles [phone]) on 04/14/2017 2:44:52 AM       Radiology Dg Chest 2 View  Result Date: 04/13/2017 CLINICAL DATA:  Initial evaluation for acute centralized chest pain we shortness of breath. EXAM: CHEST  2 VIEW COMPARISON:  Prior radiograph from  01/27/2017. FINDINGS: Cardiac and mediastinal silhouettes are stable in size and contour, and remain within normal limits. Lungs are hyperinflated with emphysematous changes. Mild left basilar scarring. No focal infiltrates. No pulmonary edema or pleural effusion. No pneumothorax. No acute osseus abnormality. IMPRESSION: 1. No active cardiopulmonary disease. 2. Emphysema with mild left basilar scarring. Electronically Signed   By: Jeannine Boga M.D.   On: 04/13/2017 22:52    Procedures Procedures (including critical care time)  Medications Ordered in ED Medications  oxyCODONE (OXYCONTIN) 12 hr tablet 10 mg (not administered)  HYDROmorphone (DILAUDID) injection 1 mg (1 mg Intravenous Given 04/14/17 0221)  HYDROcodone-acetaminophen (NORCO/VICODIN) 5-325 MG per tablet 1 tablet (1 tablet Oral Given 04/14/17 0221)     Initial Impression / Assessment and Plan / ED Course  I have reviewed the triage vital signs and the nursing notes.  Pertinent labs & imaging results that were available during my care of the patient were reviewed by me and considered in my medical decision making (see chart for details).  Clinical Course as of Apr 15 423  Thu Apr 14, 2017  0106 IMPRESSION: 1. Unchanged appearance of bilateral posterior lower lobe multifocal consolidation. 2. Findings of chronic pancreatitis with worsening of pancreatic ductal dilatation compared to the prior study. 3. Previously seen fluid collection adjacent to the pancreatic head has substantially resolved. 4. Aortic atherosclerosis.  [AN]  0413 Pt reassessed. Pt's VSS and WNL. Pt's cap refill < 3 seconds. Pt has been hydrated in the ER and now passed po challenge. Pt still has pain, but it's tolerable. Pt has chronic pancreatitis, and understands the process of clear liquid diet. We will discharge with  antiemetic and pain meds. Strict ER return precautions have been discussed and pt will return if he is unable to tolerate fluids and  symptoms are getting worse.  [AN]    Clinical Course User Index [AN] Varney Biles, MD    Pt comes in mainly for abd pain. Pt has hx of pancreatitis. Pt doesn't drink anymore, but he has had pancreatitis flair ups. Labs are reassuring. Pt's abd exam is not peritoneal and oral challenge and pain control has been started. No indication for imaging at this time.  Pt also mentions chest tightness - similar to his copd/emphysema. He has cough - but it's not new. Pt has no wheezing. On exam O2 sats are 97% and there is no resp distress. It doesn't appear that pt is in flair up of COPD. We will give him a dose of decadron here - but no need for long term steroids and no need for nebs now.   Final Clinical Impressions(s) / ED Diagnoses   Final diagnoses:  Idiopathic chronic pancreatitis (HCC)  Chronic obstructive pulmonary disease, unspecified COPD type ([place])    New Prescriptions New Prescriptions   HYDROCODONE-ACETAMINOPHEN (NORCO/VICODIN) 5-325 MG TABLET    Take 1 tablet by mouth every 6 (six) hours as needed.   ONDANSETRON (ZOFRAN ODT) 4 MG DISINTEGRATING TABLET    Take 1 tablet (4 mg total) by mouth every 8 (eight) hours as needed for nausea or vomiting.   I personally performed the services described in this documentation, which was scribed in my presence. The recorded information has been reviewed and is accurate.     Varney Biles, MD 04/14/17 [phone]

## 2017-04-14 NOTE — ED Provider Notes (Signed)
Pharmacist called from [facility] to notify that the patient has filled a prescription on 6\22 for 180 15 mg oxycodone IR. He reports this is a recurring prescription. Patient advised the pharmacist that that prescription had been stolen. Pharmacist called to determine if it was okay to fill the Vicodin is prescribed this morning. Given that information I have advised not to fill Vicodin and to inform the patient he must immediately call his provider who writes for his narcotics and let them know that his medications have been stolen and that any further narcotic pain medications will need to come from the that provider   Charlesetta Shanks, MD 04/14/17 1139

## 2017-04-14 NOTE — ED Provider Notes (Signed)
[facility] DEPT Provider Note   CSN: [id number] Arrival date & time: 10/07/16  0745     History   Chief Complaint Chief Complaint  Patient presents with  . Abdominal Pain    HPI Russell Williamson is a 61 y.o. male.  HPI [facility] pharmacy called Past Medical History:  Diagnosis Date  . Anemia   . Arthritis   . Cervical adenopathy 04/26/2012  . Chronic back pain    lower  . Chronic pancreatitis ([place])   . Constipation   . COPD (chronic obstructive pulmonary disease) ([place])   . Daily headache 05/23/2012   "small ones"  . Deficiency anemia 04/15/2016  . Dyspnea    with exertion  . G tube feedings (H[facility])    hx of  feeding tube in stomach  . GERD (gastroesophageal reflux disease)   . History of blood transfusion ~ 2004   "from the pancreatitis"  . History of radiation therapy 02/02/2011-03/22/2011   head/neck,left tonsil  . Hx of radiation therapy 02/02/11 to 03/22/11   L tonsil  . Hypertension   . Insomnia   . 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 Pancytopenia ([place]) 02/28/2014  . Poor venous access 04/28/2016  . Seizures ([place])    alcohol-related last seizure 3 yrs ago  . Thrush 11/02/2013  . 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 Urinary hesitancy     Patient Active Problem List   Diagnosis Date Noted  . Abdominal pain, epigastric   . COPD exacerbation ([place]) 01/27/2017  . Oral thrush 01/27/2017  . 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 PICC (peripherally inserted central catheter) flush 05/07/2016  . Port catheter in place 05/05/2016  . Poor venous access 04/28/2016  . Weight loss 04/16/2016  . Deficiency anemia 04/15/2016  . Chronic leukopenia 04/19/2015  . Anemia in chronic illness 04/19/2015  . Weight loss, unintentional 04/19/2015  . Relapsing chronic pancreatitis ([place]) 01/17/2015  . Diarrhea 09/04/2014  . 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 Malnutrition of moderate degree ([place]) 09/03/2014  . Hypokalemia 09/02/2014  . Pancreatitis  09/01/2014  . Chronic alcoholic pancreatitis ([place]) 09/01/2014  . Mucositis 06/17/2014  . Screening for colorectal cancer 06/17/2014  . Chronic neck pain 06/17/2014  . S/P gastrostomy ([place]) 06/17/2014  . Nicotine abuse 02/28/2014  . Pancytopenia ([place]) 02/28/2014  . Hypothyroidism (acquired) 11/02/2013  . Hypothyroidism 10/29/2013  . 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 Cervical adenopathy 04/26/2012  . Hiatal hernia   . Hx of radiation therapy   . HTN (hypertension) 10/16/2011  . History of cancer tonsil 08/20/2011  . Chronic pancreatitis [facility])     Past Surgical History:  Procedure Laterality Date  . BILE DUCT STENT PLACEMENT     hx of  . CHOLECYSTECTOMY  04/2005  . DIRECT LARYNGOSCOPY  05/23/2012   Procedure: DIRECT LARYNGOSCOPY;  Surgeon: Rozetta Nunnery, MD;  Location: [facility];  Service: ENT;  Laterality: N/A;  . DIRECT LARYNGOSCOPY N/A 03/23/2013   Procedure: DIRECT LARYNGOSCOPY;  Surgeon: Rozetta Nunnery, MD;  Location: [facility];  Service: ENT;  Laterality: N/A;  . ESOPHAGEAL DILATION N/A 03/23/2013   Procedure: ESOPHAGEAL DILATION;  Surgeon: Rozetta Nunnery, MD;  Location: [facility];  Service: ENT;  Laterality: N/A;  . ESOPHAGOGASTRODUODENOSCOPY (EGD) WITH PROPOFOL N/A 03/01/2017   Procedure: ESOPHAGOGASTRODUODENOSCOPY (EGD) WITH PROPOFOL;  Surgeon: Ladene Artist, MD;  Location: [facility];  Service: Endoscopy;  Laterality: N/A;  . FLEXIBLE SIGMOIDOSCOPY N/A 03/01/2017   Procedure: FLEXIBLE SIGMOIDOSCOPY;  Surgeon: Ladene Artist, MD;  Location: [facility];  Service: Endoscopy;  Laterality: N/A;  . IR GENERIC HISTORICAL  04/29/2016   IR US GUIDE VASC ACCESS RIGHT 04/29/2016 [facility]  . IR GENERIC HISTORICAL  04/29/2016   IR FLUORO GUIDE CV LINE RIGHT 04/29/2016 [facility]  . IR GENERIC HISTORICAL  05/12/2016   IR US GUIDE VASC ACCESS LEFT 05/12/2016 [facility]  . IR GENERIC HISTORICAL  05/12/2016   IR FLUORO GUIDE CV LINE LEFT 05/12/2016  [facility]  . IR GENERIC HISTORICAL  06/28/2016   IR US GUIDE VASC ACCESS RIGHT 06/28/2016 Sandi Mariscal, MD [facility]  . IR GENERIC HISTORICAL  06/28/2016   IR FLUORO GUIDE PORT INSERTION RIGHT 06/28/2016 Sandi Mariscal, MD [facility]  . IR REMOVAL TUN ACCESS W/ PORT W/O FL MOD SED  02/10/2017  . MASS BIOPSY  05/23/2012   Procedure: NECK MASS BIOPSY;  Surgeon: Rozetta Nunnery, MD;  Location: [facility];  Service: ENT;  Laterality: N/A;  . RADICAL NECK DISSECTION  05/23/2012   w/mass excision  . RADICAL NECK DISSECTION  05/23/2012   Procedure: RADICAL NECK DISSECTION;  Surgeon: Rozetta Nunnery, MD;  Location: [facility];  Service: ENT;  Laterality: Left;  . TIBIA FRACTURE SURGERY  1990's   left       Home Medications    Prior to Admission medications   Medication Sig Start Date End Date Taking? Authorizing Provider  doxylamine, Sleep, (UNISOM) 25 MG tablet Take 50-75 mg by mouth at bedtime as needed for sleep (sleep).    Yes [provider]  folic acid (FOLVITE) 1 MG tablet Take 1 mg by mouth daily.  05/14/15  Yes [provider]  lipase/protease/amylase (CREON-12/PANCREASE) 12000 UNITS CPEP capsule Take 1 capsule by mouth 4 (four) times daily.   Yes [provider]  mirtazapine (REMERON) 30 MG tablet Take 30 mg by mouth at bedtime.   Yes [provider]  oxyCODONE (ROXICODONE) 15 MG immediate release tablet Take 15 mg by mouth every 4 (four) hours as needed for pain.  06/25/16  Yes [provider]  PROAIR HFA 108 (90 Base) MCG/ACT inhaler Inhale 2 puff into lungs every 4-6 hours as needed for shortness of breath/wheezing 02/02/16  Yes [provider]  amLODipine (NORVASC) 2.5 MG tablet Take 2.5 mg by mouth daily. 12/24/16   [provider]  HYDROcodone-acetaminophen (NORCO/VICODIN) 5-325 MG tablet Take 1 tablet by mouth every 6 (six) hours as needed. 04/14/17   Varney Biles, MD  nicotine (NICODERM CQ - DOSED IN MG/24 HOURS) 21  mg/24hr patch Place 1 patch (21 mg total) onto the skin daily. Patient not taking: Reported on 03/10/2017 01/27/17   Heath Lark, MD  Omega-3 Fatty Acids (OMEGA-3 FISH OIL) 300 MG CAPS Take 300 mg by mouth daily.    [provider]  ondansetron (ZOFRAN ODT) 4 MG disintegrating tablet Take 1 tablet (4 mg total) by mouth every 8 (eight) hours as needed for nausea or vomiting. 04/14/17   Varney Biles, MD  pantoprazole (PROTONIX) 20 MG tablet Take 1 tablet (20 mg total) by mouth 2 (two) times daily. 10/07/16   Gareth Morgan, MD  polyethylene glycol powder (MIRALAX) powder Take 17 g by mouth daily. 03/10/17   Jola Schmidt, MD  promethazine (PHENERGAN) 25 MG tablet Take 1 tablet (25 mg total) by mouth every 6 (six) hours as needed for nausea or vomiting. Patient not taking: Reported on 03/10/2017 10/07/16   Gareth Morgan, MD  temazepam (RESTORIL) 30 MG capsule TAKE 1 CAPSULE ( 30 MG ) BY MOUTH EVERY NIGHT 12/03/16   [provider]  traMADol (ULTRAM) 50 MG tablet Take 1 tablet (50 mg total) by mouth every 6 (six) hours as needed. Patient taking differently: Take 50 mg by mouth every 6 (six) hours as needed for moderate pain or severe pain.  10/22/16   Levin Erp, PA    Family History Family History  Problem Relation Age of Onset  . COPD Mother   . Colon cancer Neg Hx     Social History Social History  Substance Use Topics  . Smoking status: Current Every Day Smoker    Packs/day: 0.25    Years: 40.00    Types: Cigarettes  . Smokeless tobacco: Never Used     Comment: hx 1/2 -1 PPD  . Alcohol use No     Comment: 05/23/2012 hx alcohol abuse, quit ~ 2007     Allergies   Patient has no known allergies.   Review of Systems Review of Systems   Physical Exam Updated Vital Signs BP 154/87   Pulse (!) 57   Temp 98 F (36.7 C) (Oral)   Resp 16   SpO2 96%   Physical Exam   ED Treatments / Results  Labs (all labs ordered are listed, but only abnormal  results are displayed) Labs Reviewed  COMPREHENSIVE METABOLIC PANEL - Abnormal; Notable for the following:       Result Value   Sodium 134 (*)    Chloride 99 (*)    Glucose, Bld 103 (*)    Creatinine, Ser 0.51 (*)    AST 46 (*)    All other components within normal limits  CBC - Abnormal; Notable for the following:    WBC 3.0 (*)    RBC 3.11 (*)    Hemoglobin 11.5 (*)    HCT 33.3 (*)    MCV 107.1 (*)    MCH 37.0 (*)    All other components within normal limits  LIPASE, BLOOD  I-STAT TROPOININ, ED    EKG  EKG Interpretation  Date/Time:  Thursday October 07 2016 08:09:44 EST Ventricular Rate:  85 PR Interval:    QRS Duration: 87 QT Interval:  399 QTC Calculation: 475 R Axis:   86 Text Interpretation:  Sinus rhythm Multiform ventricular premature complexes Anterior infarct, old PVC new, otherqise no significant changes Confirmed by [facility] MD, ERIN ([id number]) on 10/07/2016 10:46:49 AM       Radiology Dg Chest 2 View  Result Date: 04/13/2017 CLINICAL DATA:  Initial evaluation for acute centralized chest pain we shortness of breath. EXAM: CHEST  2 VIEW COMPARISON:  Prior radiograph from 01/27/2017. FINDINGS: Cardiac and mediastinal silhouettes are stable in size and contour, and remain within normal limits. Lungs are hyperinflated with emphysematous changes. Mild left basilar scarring. No focal infiltrates. No pulmonary edema or pleural effusion. No pneumothorax. No acute osseus abnormality. IMPRESSION: 1. No active cardiopulmonary disease. 2. Emphysema with mild left basilar scarring. Electronically Signed   By: Jeannine Boga M.D.   On: 04/13/2017 22:52    Procedures Procedures (including critical care time)  Medications Ordered in ED Medications  dicyclomine (BENTYL) injection 20 mg (20 mg Intramuscular Given 10/07/16 0935)  oxyCODONE (ROXICODONE) 5 MG/5ML solution 10 mg (10 mg Oral Given 10/07/16 0937)  promethazine (PHENERGAN) tablet 25 mg (25 mg Oral Given 10/07/16  0936)  albuterol (PROVENTIL HFA;VENTOLIN HFA) 108 (90 Base) MCG/ACT inhaler 2 puff (2 puffs Inhalation  Given 10/07/16 0936)  heparin lock flush 100 unit/mL (500 Units Intracatheter Given 10/07/16 1132)     Initial Impression / Assessment and Plan / ED Course  I have reviewed the triage vital signs and the nursing notes.  Pertinent labs & imaging results that were available during my care of the patient were reviewed by me and considered in my medical decision making (see chart for details).       Final Clinical Impressions(s) / ED Diagnoses   Final diagnoses:  Epigastric abdominal pain  Community acquired pneumonia of right lower lobe of lung ([place])  Chronic pancreatitis, unspecified pancreatitis type [facility])    New Prescriptions Discharge Medication List as of 10/07/2016 11:10 AM    START taking these medications   Details  levofloxacin (LEVAQUIN) 500 MG tablet Take 1 tablet (500 mg total) by mouth daily., Starting Thu 10/07/2016, Until Thu 10/14/2016, Print    pantoprazole (PROTONIX) 20 MG tablet Take 1 tablet (20 mg total) by mouth 2 (two) times daily., Starting Thu 10/07/2016, Print    !! promethazine (PHENERGAN) 25 MG tablet Take 1 tablet (25 mg total) by mouth every 6 (six) hours as needed for nausea or vomiting., Starting Thu 10/07/2016, Print    dicyclomine (BENTYL) 20 MG tablet Take 1 tablet (20 mg total) by mouth 2 (two) times daily., Starting Thu 10/07/2016, Print     !! - Potential duplicate medications found. Please discuss with provider.       Charlesetta Shanks, MD 04/14/17 1140

## 2017-04-28 ENCOUNTER — Ambulatory Visit: Payer: Self-pay

## 2017-07-01 ENCOUNTER — Encounter (HOSPITAL_COMMUNITY): Payer: Self-pay

## 2017-07-01 ENCOUNTER — Observation Stay (HOSPITAL_COMMUNITY)
Admission: EM | Admit: 2017-07-01 | Discharge: 2017-07-03 | Disposition: A | Discharge status: Home / Self Care | Payer: Medicaid Other | Attending: Internal Medicine | Admitting: Internal Medicine

## 2017-07-01 ENCOUNTER — Emergency Department (HOSPITAL_COMMUNITY): Payer: Medicaid Other

## 2017-07-01 DIAGNOSIS — K861 Other chronic pancreatitis: Secondary | ICD-10-CM | POA: Diagnosis present

## 2017-07-01 DIAGNOSIS — Z79899 Other long term (current) drug therapy: Secondary | ICD-10-CM | POA: Insufficient documentation

## 2017-07-01 DIAGNOSIS — E43 Unspecified severe protein-calorie malnutrition: Secondary | ICD-10-CM | POA: Insufficient documentation

## 2017-07-01 DIAGNOSIS — Z931 Gastrostomy status: Secondary | ICD-10-CM | POA: Diagnosis not present

## 2017-07-01 DIAGNOSIS — R590 Localized enlarged lymph nodes: Secondary | ICD-10-CM | POA: Insufficient documentation

## 2017-07-01 DIAGNOSIS — D649 Anemia, unspecified: Secondary | ICD-10-CM | POA: Diagnosis not present

## 2017-07-01 DIAGNOSIS — I10 Essential (primary) hypertension: Secondary | ICD-10-CM | POA: Diagnosis present

## 2017-07-01 DIAGNOSIS — K860 Alcohol-induced chronic pancreatitis: Secondary | ICD-10-CM | POA: Insufficient documentation

## 2017-07-01 DIAGNOSIS — K5900 Constipation, unspecified: Secondary | ICD-10-CM | POA: Insufficient documentation

## 2017-07-01 DIAGNOSIS — R1013 Epigastric pain: Secondary | ICD-10-CM | POA: Diagnosis not present

## 2017-07-01 DIAGNOSIS — Z9049 Acquired absence of other specified parts of digestive tract: Secondary | ICD-10-CM | POA: Insufficient documentation

## 2017-07-01 DIAGNOSIS — F17210 Nicotine dependence, cigarettes, uncomplicated: Secondary | ICD-10-CM | POA: Insufficient documentation

## 2017-07-01 DIAGNOSIS — E876 Hypokalemia: Secondary | ICD-10-CM | POA: Insufficient documentation

## 2017-07-01 DIAGNOSIS — K219 Gastro-esophageal reflux disease without esophagitis: Secondary | ICD-10-CM | POA: Diagnosis not present

## 2017-07-01 DIAGNOSIS — M545 Low back pain: Secondary | ICD-10-CM | POA: Insufficient documentation

## 2017-07-01 DIAGNOSIS — J441 Chronic obstructive pulmonary disease with (acute) exacerbation: Secondary | ICD-10-CM | POA: Diagnosis present

## 2017-07-01 DIAGNOSIS — Z590 Homelessness: Secondary | ICD-10-CM | POA: Insufficient documentation

## 2017-07-01 DIAGNOSIS — D61818 Other pancytopenia: Secondary | ICD-10-CM | POA: Diagnosis not present

## 2017-07-01 DIAGNOSIS — E039 Hypothyroidism, unspecified: Secondary | ICD-10-CM | POA: Diagnosis present

## 2017-07-01 DIAGNOSIS — Z85818 Personal history of malignant neoplasm of other sites of lip, oral cavity, and pharynx: Secondary | ICD-10-CM | POA: Insufficient documentation

## 2017-07-01 DIAGNOSIS — Z720 Tobacco use: Secondary | ICD-10-CM | POA: Diagnosis present

## 2017-07-01 DIAGNOSIS — R05 Cough: Secondary | ICD-10-CM | POA: Insufficient documentation

## 2017-07-01 DIAGNOSIS — G4700 Insomnia, unspecified: Secondary | ICD-10-CM | POA: Diagnosis not present

## 2017-07-01 DIAGNOSIS — R0781 Pleurodynia: Secondary | ICD-10-CM | POA: Insufficient documentation

## 2017-07-01 DIAGNOSIS — D72819 Decreased white blood cell count, unspecified: Secondary | ICD-10-CM | POA: Diagnosis present

## 2017-07-01 DIAGNOSIS — R9431 Abnormal electrocardiogram [ECG] [EKG]: Secondary | ICD-10-CM | POA: Diagnosis present

## 2017-07-01 LAB — CBC
HCT: 39.7 % (ref 39.0–52.0)
HEMOGLOBIN: 13.4 g/dL (ref 13.0–17.0)
MCH: 33.3 pg (ref 26.0–34.0)
MCHC: 33.8 g/dL (ref 30.0–36.0)
MCV: 98.8 fL (ref 78.0–100.0)
Platelets: 133 10*3/uL — ABNORMAL LOW (ref 150–400)
RBC: 4.02 MIL/uL — ABNORMAL LOW (ref 4.22–5.81)
RDW: 16.5 % — ABNORMAL HIGH (ref 11.5–15.5)
WBC: 2.3 10*3/uL — AB (ref 4.0–10.5)

## 2017-07-01 LAB — BASIC METABOLIC PANEL
ANION GAP: 10 (ref 5–15)
BUN: 9 mg/dL (ref 6–20)
CALCIUM: 9.4 mg/dL (ref 8.9–10.3)
CO2: 31 mmol/L (ref 22–32)
Chloride: 98 mmol/L — ABNORMAL LOW (ref 101–111)
Creatinine, Ser: 0.53 mg/dL — ABNORMAL LOW (ref 0.61–1.24)
GFR calc Af Amer: >60 mL/min (ref >60)
Glucose, Bld: 93 mg/dL (ref 65–99)
POTASSIUM: 4.1 mmol/L (ref 3.5–5.1)
SODIUM: 139 mmol/L (ref 135–145)

## 2017-07-01 MED ORDER — PREDNISONE 20 MG PO TABS
60.0000 mg | ORAL_TABLET | Freq: Once | ORAL | Status: AC
  Administered 2017-07-01: 60 mg via ORAL
  Filled 2017-07-01: qty 3

## 2017-07-01 MED ORDER — IPRATROPIUM-ALBUTEROL 0.5-2.5 (3) MG/3ML IN SOLN
3.0000 mL | Freq: Once | RESPIRATORY_TRACT | Status: AC
  Administered 2017-07-01: 3 mL via RESPIRATORY_TRACT
  Filled 2017-07-01: qty 3

## 2017-07-01 MED ORDER — ALBUTEROL (5 MG/ML) CONTINUOUS INHALATION SOLN
10.0000 mg/h | INHALATION_SOLUTION | Freq: Once | RESPIRATORY_TRACT | Status: AC
  Administered 2017-07-02: 10 mg/h via RESPIRATORY_TRACT
  Filled 2017-07-01: qty 20

## 2017-07-01 NOTE — ED Notes (Signed)
Attempted to obtain labs x2 unable.

## 2017-07-01 NOTE — ED Provider Notes (Signed)
[facility] DEPT Provider Note   CSN: [id number] Arrival date & time: 07/01/17  1424     History   Chief Complaint Chief Complaint  Patient presents with  . Chest Pain  . Cough  . Shortness of Breath    HPI Russell Williamson is a 61 y.o. male who presents with a cough. PMH significant for COPD (current smoker), chronic pancreatitis, G tube feedings, and tonsillar cancer. The productive cough started one week ago. He reports he started to have right sided chest pain a couple days ago. It feels like a pressure. It is worse with exertion. He states he's always had SOB with exertion but it's gotten worse over the past couple days as well. He reports abdominal pain as well from his pancreas. This is chronic. No fever, chills, syncope, diaphoresis, nausea, vomiting.   HPI  Past Medical History:  Diagnosis Date  . Anemia   . Arthritis   . Cervical adenopathy 04/26/2012  . Chronic back pain    lower  . Chronic pancreatitis ([place])   . Constipation   . COPD (chronic obstructive pulmonary disease) ([place])   . Daily headache 05/23/2012   "small ones"  . Deficiency anemia 04/15/2016  . Dyspnea    with exertion  . G tube feedings ([facility])    hx of  feeding tube in stomach  . GERD (gastroesophageal reflux disease)   . History of blood transfusion ~ 2004   "from the pancreatitis"  . History of radiation therapy 02/02/2011-03/22/2011   head/neck,left tonsil  . Hx of radiation therapy 02/02/11 to 03/22/11   L tonsil  . Hypertension   . Insomnia   . 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 Pancytopenia ([place]) 02/28/2014  . Poor venous access 04/28/2016  . Seizures ([place])    alcohol-related last seizure 3 yrs ago  . Thrush 11/02/2013  . 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 Urinary hesitancy     Patient Active Problem List   Diagnosis Date Noted  . Abdominal pain, epigastric   . COPD exacerbation ([place]) 01/27/2017  . Oral thrush 01/27/2017  . Megaloblastic  anemia 05/13/2016  . PICC (peripherally inserted central catheter) flush 05/07/2016  . Port catheter in place 05/05/2016  . Poor venous access 04/28/2016  . Deficiency anemia 04/15/2016  . Chronic leukopenia 04/19/2015  . Anemia in chronic illness 04/19/2015  . Weight loss, unintentional 04/19/2015  . Relapsing chronic pancreatitis ([place]) 01/17/2015  . Diarrhea 09/04/2014  . 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 Malnutrition of moderate degree ([place]) 09/03/2014  . Hypokalemia 09/02/2014  . Chronic alcoholic pancreatitis ([place]) 09/01/2014  . Mucositis 06/17/2014  . Chronic neck pain 06/17/2014  . S/P gastrostomy ([place]) 06/17/2014  . Nicotine abuse 02/28/2014  . Pancytopenia ([place]) 02/28/2014  . Hypothyroidism (acquired) 11/02/2013  . Hypothyroidism 10/29/2013  . 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 Cervical adenopathy 04/26/2012  . Hiatal hernia   . Hx of radiation therapy   . HTN (hypertension) 10/16/2011  . History of cancer tonsil 08/20/2011    Past Surgical History:  Procedure Laterality Date  . BILE DUCT STENT PLACEMENT     hx of  . CHOLECYSTECTOMY  04/2005  . DIRECT LARYNGOSCOPY  05/23/2012   Procedure: DIRECT LARYNGOSCOPY;  Surgeon: Rozetta Nunnery, MD;  Location: [facility];  Service: ENT;  Laterality: N/A;  . DIRECT LARYNGOSCOPY N/A 03/23/2013   Procedure: DIRECT LARYNGOSCOPY;  Surgeon: Rozetta Nunnery, MD;  Location: [facility];  Service: ENT;  Laterality: N/A;  . ESOPHAGEAL DILATION N/A 03/23/2013   Procedure: ESOPHAGEAL DILATION;  Surgeon: Rozetta Nunnery, MD;  Location: [facility];  Service: ENT;  Laterality: N/A;  . ESOPHAGOGASTRODUODENOSCOPY (EGD) WITH PROPOFOL N/A 03/01/2017   Procedure: ESOPHAGOGASTRODUODENOSCOPY (EGD) WITH PROPOFOL;  Surgeon: Ladene Artist, MD;  Location: [facility];  Service: Endoscopy;  Laterality: N/A;  . FLEXIBLE SIGMOIDOSCOPY N/A 03/01/2017   Procedure: FLEXIBLE SIGMOIDOSCOPY;  Surgeon: Ladene Artist, MD;  [facility]: [facility];  Service: Endoscopy;  Laterality: N/A;  . IR GENERIC HISTORICAL  04/29/2016   IR US GUIDE VASC ACCESS RIGHT 04/29/2016 [facility]  . IR GENERIC HISTORICAL  04/29/2016   IR FLUORO GUIDE CV LINE RIGHT 04/29/2016 [facility]  . IR GENERIC HISTORICAL  05/12/2016   IR US GUIDE VASC ACCESS LEFT 05/12/2016 [facility]  . IR GENERIC HISTORICAL  05/12/2016   IR FLUORO GUIDE CV LINE LEFT 05/12/2016 [facility]  . IR GENERIC HISTORICAL  06/28/2016   IR US GUIDE VASC ACCESS RIGHT 06/28/2016 Sandi Mariscal, MD [facility]  . IR GENERIC HISTORICAL  06/28/2016   IR FLUORO GUIDE PORT INSERTION RIGHT 06/28/2016 Sandi Mariscal, MD [facility]  . IR REMOVAL TUN ACCESS W/ PORT W/O FL MOD SED  02/10/2017  . MASS BIOPSY  05/23/2012   Procedure: NECK MASS BIOPSY;  Surgeon: Rozetta Nunnery, MD;  Location: [facility];  Service: ENT;  Laterality: N/A;  . RADICAL NECK DISSECTION  05/23/2012   w/mass excision  . RADICAL NECK DISSECTION  05/23/2012   Procedure: RADICAL NECK DISSECTION;  Surgeon: Rozetta Nunnery, MD;  Location: [facility];  Service: ENT;  Laterality: Left;  . TIBIA FRACTURE SURGERY  1990's   left       Home Medications    Prior to Admission medications   Medication Sig Start Date End Date Taking? Authorizing Provider  amLODipine (NORVASC) 2.5 MG tablet Take 2.5 mg by mouth daily. 12/24/16   [provider]  doxylamine, Sleep, (UNISOM) 25 MG tablet Take 50-75 mg by mouth at bedtime as needed for sleep (sleep).     [provider]  folic acid (FOLVITE) 1 MG tablet Take 1 mg by mouth daily.  05/14/15   [provider]  HYDROcodone-acetaminophen (NORCO/VICODIN) 5-325 MG tablet Take 1 tablet by mouth every 6 (six) hours as needed. 04/14/17   Varney Biles, MD  lipase/protease/amylase (CREON-12/PANCREASE) 12000 UNITS CPEP capsule Take 1 capsule by mouth 4 (four) times daily.    [provider]  mirtazapine (REMERON) 30 MG tablet Take 30 mg by mouth at bedtime.     [provider]  nicotine (NICODERM CQ - DOSED IN MG/24 HOURS) 21 mg/24hr patch Place 1 patch (21 mg total) onto the skin daily. Patient not taking: Reported on 03/10/2017 01/27/17   Heath Lark, MD  Omega-3 Fatty Acids (OMEGA-3 FISH OIL) 300 MG CAPS Take 300 mg by mouth daily.    [provider]  ondansetron (ZOFRAN ODT) 4 MG disintegrating tablet Take 1 tablet (4 mg total) by mouth every 8 (eight) hours as needed for nausea or vomiting. 04/14/17   Varney Biles, MD  oxyCODONE (ROXICODONE) 15 MG immediate release tablet Take 15 mg by mouth every 4 (four) hours as needed for pain.  06/25/16   [provider]  pantoprazole (PROTONIX) 20 MG tablet Take 1 tablet (20 mg total) by mouth 2 (two) times daily. 10/07/16   Gareth Morgan, MD  polyethylene glycol powder (MIRALAX) powder Take 17 g  by mouth daily. 03/10/17   Jola Schmidt, MD  PROAIR HFA 108 [phone] Base) MCG/ACT inhaler Inhale 2 puff into lungs every 4-6 hours as needed for shortness of breath/wheezing 02/02/16   [provider]  promethazine (PHENERGAN) 25 MG tablet Take 1 tablet (25 mg total) by mouth every 6 (six) hours as needed for nausea or vomiting. Patient not taking: Reported on 03/10/2017 10/07/16   Gareth Morgan, MD  temazepam (RESTORIL) 30 MG capsule TAKE 1 CAPSULE ( 30 MG ) BY MOUTH EVERY NIGHT 12/03/16   [provider]  traMADol (ULTRAM) 50 MG tablet Take 1 tablet (50 mg total) by mouth every 6 (six) hours as needed. Patient taking differently: Take 50 mg by mouth every 6 (six) hours as needed for moderate pain or severe pain.  10/22/16   Levin Erp, PA    Family History Family History  Problem Relation Age of Onset  . COPD Mother   . Colon cancer Neg Hx     Social History Social History  Substance Use Topics  . Smoking status: Current Every Day Smoker    Packs/day: 0.25    Years: 40.00    Types: Cigarettes  . Smokeless tobacco: Never Used     Comment: hx 1/2 -1 PPD  .  Alcohol use No     Comment: 05/23/2012 hx alcohol abuse, quit ~ 2007     Allergies   Patient has no known allergies.   Review of Systems Review of Systems  Constitutional: Negative for chills and fever.  Respiratory: Positive for cough, shortness of breath and wheezing.   Cardiovascular: Positive for chest pain. Negative for palpitations and leg swelling.  Gastrointestinal: Positive for abdominal pain. Negative for nausea and vomiting.  Neurological: Positive for light-headedness.  All other systems reviewed and are negative.    Physical Exam Updated Vital Signs BP (!) 145/90 (BP Location: Right Arm)   Pulse (!) 56   Temp 98.4 F (36.9 C) (Oral)   Resp 18   Ht 5\' 6"  (1.676 m)   Wt 40.8 kg (90 lb)   SpO2 100%   BMI 14.53 kg/m   Physical Exam  Constitutional: He is oriented to person, place, and time. He appears well-developed and well-nourished. No distress.  Calm, cooperative, underweight  HENT:  Head: Normocephalic and atraumatic.  Eyes: Pupils are equal, round, and reactive to light. Conjunctivae are normal. Right eye exhibits no discharge. Left eye exhibits no discharge. No scleral icterus.  Neck: Normal range of motion.  Cardiovascular: Normal rate and regular rhythm.  Exam reveals no gallop and no friction rub.   No murmur heard. Pulmonary/Chest: Effort normal. No respiratory distress. He has wheezes. He has rhonchi. He has no rales. He exhibits no tenderness.  Diffuse wheezes and rhonchi in all lung fields  Abdominal: Soft. Bowel sounds are normal. He exhibits no distension and no mass. There is tenderness (epigastric and LUQ pain). There is no rebound and no guarding. No hernia.  Neurological: He is alert and oriented to person, place, and time.  Skin: Skin is warm and dry.  Psychiatric: He has a normal mood and affect. His behavior is normal.  Nursing note and vitals reviewed.    ED Treatments / Results  Labs (all labs ordered are listed, but only abnormal  results are displayed) Labs Reviewed  BASIC METABOLIC PANEL - Abnormal; Notable for the following:       Result Value   Chloride 98 (*)    Creatinine, Ser 0.53 (*)  All other components within normal limits  CBC - Abnormal; Notable for the following:    WBC 2.3 (*)    RBC 4.02 (*)    RDW 16.5 (*)    Platelets 133 (*)    All other components within normal limits  MRSA PCR SCREENING  MAGNESIUM  PHOSPHORUS  LIPASE, BLOOD  I-STAT TROPONIN, ED    EKG  EKG Interpretation  Date/Time:  Friday July 01 2017 14:32:25 EDT Ventricular Rate:  87 PR Interval:    QRS Duration: 65 QT Interval:  366 QTC Calculation: 441 R Axis:   86 Text Interpretation:  Sinus rhythm Multiform ventricular premature complexes Anterior infarct, old since last tracing no significant change Confirmed by Malvin Johns [phone]) on 07/01/2017 2:36:19 PM       Radiology Dg Chest 2 View  Result Date: 07/01/2017 CLINICAL DATA:  Chest pain, shortness of breath and cough for 2 days. History of COPD. EXAM: CHEST  2 VIEW COMPARISON:  Chest radiograph April 13, 2017 FINDINGS: Cardiomediastinal silhouette is normal. No pleural effusions or focal consolidations. Increased lung volumes with scarring, unchanged. Trachea projects midline and there is no pneumothorax. Soft tissue planes and included osseous structures are non-suspicious. Old bilateral rib fractures. Surgical clips in the included right abdomen compatible with cholecystectomy. IMPRESSION: COPD, no acute cardiopulmonary process. Electronically Signed   By: Elon Alas M.D.   On: 07/01/2017 14:57    Procedures Procedures (including critical care time)  Medications Ordered in ED Medications  magnesium sulfate IVPB 2 g 50 mL (2 g Intravenous New Bag/Given 07/02/17 0315)  heparin injection 5,000 Units (not administered)  albuterol (PROVENTIL) (2.5 MG/3ML) 0.083% nebulizer solution 2.5 mg (not administered)  ipratropium (ATROVENT) nebulizer solution 0.5  mg (not administered)  albuterol (PROVENTIL) (2.5 MG/3ML) 0.083% nebulizer solution 2.5 mg (not administered)  0.45 % NaCl with KCl 20 mEq / L infusion (not administered)  predniSONE (DELTASONE) tablet 60 mg (60 mg Oral Given 07/01/17 2303)  ipratropium-albuterol (DUONEB) 0.5-2.5 (3) MG/3ML nebulizer solution 3 mL (3 mLs Nebulization Given 07/01/17 2301)  albuterol (PROVENTIL,VENTOLIN) solution continuous neb (10 mg/hr Nebulization Given 07/02/17 0036)     Initial Impression / Assessment and Plan / ED Course  I have reviewed the triage vital signs and the nursing notes.  Pertinent labs & imaging results that were available during my care of the patient were reviewed by me and considered in my medical decision making (see chart for details).  61 year old male presents with COPD exacerbation. O2 sats are normal. He is afebrile. He has diffuse wheezing and rhonchi on exam. Abdomen is soft, minimally tender. CBC remarkable for leukopenia and thrombocytopenia which is chronic. BMP is normal. CXR does not show anything acute. EKG shows SR with PVCs. Breathing tx and steroids ordered.  On recheck he still has diffuse wheezing. Will order continuous neb.  2nd recheck he still has not had clinical improvement. Will order Mag. Spoke to Dr. Olevia Bowens who will admit.  Final Clinical Impressions(s) / ED Diagnoses   Final diagnoses:  COPD exacerbation [facility])    New Prescriptions New Prescriptions   No medications on file     Iris Pert 07/02/17 [place], [place], DO 07/02/17 2320

## 2017-07-01 NOTE — ED Triage Notes (Signed)
Patient c/o a productive cough x 1 week and right chest pain 3-4 days. SOB with exertion

## 2017-07-02 ENCOUNTER — Observation Stay (HOSPITAL_BASED_OUTPATIENT_CLINIC_OR_DEPARTMENT_OTHER): Payer: Medicaid Other

## 2017-07-02 ENCOUNTER — Encounter (HOSPITAL_COMMUNITY): Payer: Self-pay | Admitting: Internal Medicine

## 2017-07-02 DIAGNOSIS — J441 Chronic obstructive pulmonary disease with (acute) exacerbation: Secondary | ICD-10-CM

## 2017-07-02 DIAGNOSIS — R9431 Abnormal electrocardiogram [ECG] [EKG]: Secondary | ICD-10-CM | POA: Diagnosis present

## 2017-07-02 LAB — LIPASE, BLOOD: Lipase: 17 U/L (ref 11–51)

## 2017-07-02 LAB — MRSA PCR SCREENING: MRSA by PCR: POSITIVE — AB

## 2017-07-02 LAB — ECHOCARDIOGRAM COMPLETE
HEIGHTINCHES: 66 in
Weight: 1440 oz

## 2017-07-02 LAB — MAGNESIUM: Magnesium: 1.7 mg/dL (ref 1.7–2.4)

## 2017-07-02 LAB — PHOSPHORUS: Phosphorus: 2.9 mg/dL (ref 2.5–4.6)

## 2017-07-02 MED ORDER — AMLODIPINE BESYLATE 5 MG PO TABS
2.5000 mg | ORAL_TABLET | Freq: Every day | ORAL | Status: DC
  Administered 2017-07-02 – 2017-07-03 (×2): 2.5 mg via ORAL
  Filled 2017-07-02 (×2): qty 1

## 2017-07-02 MED ORDER — ALBUTEROL SULFATE (2.5 MG/3ML) 0.083% IN NEBU
2.5000 mg | INHALATION_SOLUTION | Freq: Four times a day (QID) | RESPIRATORY_TRACT | Status: DC
  Administered 2017-07-02: 2.5 mg via RESPIRATORY_TRACT
  Filled 2017-07-02: qty 3

## 2017-07-02 MED ORDER — METHYLPREDNISOLONE SODIUM SUCC 40 MG IJ SOLR
40.0000 mg | Freq: Four times a day (QID) | INTRAMUSCULAR | Status: DC
  Administered 2017-07-02 – 2017-07-03 (×5): 40 mg via INTRAVENOUS
  Filled 2017-07-02 (×5): qty 1

## 2017-07-02 MED ORDER — CHLORHEXIDINE GLUCONATE CLOTH 2 % EX PADS
6.0000 | MEDICATED_PAD | Freq: Every day | CUTANEOUS | Status: DC
  Administered 2017-07-03: 6 via TOPICAL

## 2017-07-02 MED ORDER — MAGNESIUM SULFATE 50 % IJ SOLN: 2.0000 g | Freq: Once | INTRAMUSCULAR | Status: DC

## 2017-07-02 MED ORDER — MAGNESIUM SULFATE 2 GM/50ML IV SOLN
2.0000 g | Freq: Once | INTRAVENOUS | Status: AC
  Administered 2017-07-02: 2 g via INTRAVENOUS
  Filled 2017-07-02: qty 50

## 2017-07-02 MED ORDER — IPRATROPIUM BROMIDE 0.02 % IN SOLN
0.5000 mg | Freq: Four times a day (QID) | RESPIRATORY_TRACT | Status: DC
  Administered 2017-07-02: 0.5 mg via RESPIRATORY_TRACT
  Filled 2017-07-02: qty 2.5

## 2017-07-02 MED ORDER — MUPIROCIN 2 % EX OINT
1.0000 "application " | TOPICAL_OINTMENT | Freq: Two times a day (BID) | CUTANEOUS | Status: DC
  Administered 2017-07-02 – 2017-07-03 (×2): 1 via NASAL
  Filled 2017-07-02: qty 22

## 2017-07-02 MED ORDER — PANCRELIPASE (LIP-PROT-AMYL) 12000-38000 UNITS PO CPEP
12000.0000 [IU] | ORAL_CAPSULE | Freq: Four times a day (QID) | ORAL | Status: DC
  Administered 2017-07-02 – 2017-07-03 (×4): 12000 [IU] via ORAL
  Filled 2017-07-02 (×4): qty 1

## 2017-07-02 MED ORDER — ALBUTEROL SULFATE (2.5 MG/3ML) 0.083% IN NEBU: 2.5000 mg | INHALATION_SOLUTION | RESPIRATORY_TRACT | Status: DC | PRN

## 2017-07-02 MED ORDER — IPRATROPIUM-ALBUTEROL 0.5-2.5 (3) MG/3ML IN SOLN
3.0000 mL | Freq: Four times a day (QID) | RESPIRATORY_TRACT | Status: DC
  Administered 2017-07-02 (×2): 3 mL via RESPIRATORY_TRACT
  Filled 2017-07-02 (×3): qty 3

## 2017-07-02 MED ORDER — HEPARIN SODIUM (PORCINE) 5000 UNIT/ML IJ SOLN
5000.0000 [IU] | Freq: Three times a day (TID) | INTRAMUSCULAR | Status: DC
  Administered 2017-07-02 – 2017-07-03 (×5): 5000 [IU] via SUBCUTANEOUS
  Filled 2017-07-02 (×7): qty 1

## 2017-07-02 MED ORDER — IPRATROPIUM-ALBUTEROL 0.5-2.5 (3) MG/3ML IN SOLN
3.0000 mL | Freq: Three times a day (TID) | RESPIRATORY_TRACT | Status: DC
  Administered 2017-07-03: 3 mL via RESPIRATORY_TRACT
  Filled 2017-07-02: qty 3

## 2017-07-02 MED ORDER — POTASSIUM CHLORIDE IN NACL 20-0.45 MEQ/L-% IV SOLN
INTRAVENOUS | Status: DC
  Administered 2017-07-02: 1000 mL via INTRAVENOUS
  Filled 2017-07-02: qty 1000

## 2017-07-02 MED ORDER — MIRTAZAPINE 15 MG PO TABS
30.0000 mg | ORAL_TABLET | Freq: Every day | ORAL | Status: DC
  Administered 2017-07-02: 30 mg via ORAL
  Filled 2017-07-02: qty 2

## 2017-07-02 MED ORDER — HYDROCODONE-ACETAMINOPHEN 5-325 MG PO TABS
1.0000 | ORAL_TABLET | Freq: Four times a day (QID) | ORAL | Status: DC | PRN
  Administered 2017-07-02 – 2017-07-03 (×4): 1 via ORAL
  Filled 2017-07-02 (×5): qty 1

## 2017-07-02 MED ORDER — HYDROCODONE-ACETAMINOPHEN 5-325 MG PO TABS
1.0000 | ORAL_TABLET | Freq: Once | ORAL | Status: AC
  Administered 2017-07-02: 1 via ORAL
  Filled 2017-07-02: qty 1

## 2017-07-02 MED ORDER — POLYETHYLENE GLYCOL 3350 17 G PO PACK
17.0000 g | PACK | Freq: Every day | ORAL | Status: DC
  Administered 2017-07-02 – 2017-07-03 (×2): 17 g via ORAL
  Filled 2017-07-02 (×2): qty 1

## 2017-07-02 MED ORDER — PROMETHAZINE HCL 25 MG PO TABS: 25.0000 mg | ORAL_TABLET | Freq: Four times a day (QID) | ORAL | Status: DC | PRN

## 2017-07-02 MED ORDER — FOLIC ACID 1 MG PO TABS
1.0000 mg | ORAL_TABLET | Freq: Every day | ORAL | Status: DC
  Administered 2017-07-02 – 2017-07-03 (×2): 1 mg via ORAL
  Filled 2017-07-02 (×3): qty 1

## 2017-07-02 MED ORDER — POLYETHYLENE GLYCOL 3350 17 GM/SCOOP PO POWD: 17.0000 g | Freq: Every day | ORAL | Status: DC

## 2017-07-02 MED ORDER — AZITHROMYCIN 250 MG PO TABS
500.0000 mg | ORAL_TABLET | Freq: Every day | ORAL | Status: DC
  Administered 2017-07-02 – 2017-07-03 (×2): 500 mg via ORAL
  Filled 2017-07-02 (×2): qty 2

## 2017-07-02 MED ORDER — PANTOPRAZOLE SODIUM 20 MG PO TBEC
20.0000 mg | DELAYED_RELEASE_TABLET | Freq: Two times a day (BID) | ORAL | Status: DC
  Administered 2017-07-02 – 2017-07-03 (×2): 20 mg via ORAL
  Filled 2017-07-02 (×2): qty 1

## 2017-07-02 MED ORDER — ENSURE ENLIVE PO LIQD
237.0000 mL | Freq: Two times a day (BID) | ORAL | Status: DC
  Administered 2017-07-03 (×2): 237 mL via ORAL

## 2017-07-02 NOTE — Progress Notes (Signed)
Report received from [place], rn in ED. Pt to be brought up to floor momentarily.  Hale Bogus.

## 2017-07-02 NOTE — Progress Notes (Addendum)
  PROGRESS NOTE  Patient admitted earlier this morning. See H&P. Russell Williamson is a 61 y.o. male with medical history significant of anemia, osteoarthritis, cervical adenopathy, chronic lower back pain, chronic pancreatitis, constipation, chronic dyspnea, GERD, COPD, chronic dyspnea; he came to the ED with a history of productive cough for one week associated with right-sided pleuritic chest pain for the past 3 days. He was admitted for COPD exacerbation.   Currently on my exam, states he is doing better. Denies chest pain. He has diffuse expiratory wheezes as well as rhonchi diffusely.  Continue O2 as needed, nebs, steroids, azithromax  Echo pending    Dessa Phi, DO [facility] [email or web address] Password [email or web address] 07/02/2017, 1:47 PM

## 2017-07-02 NOTE — Progress Notes (Signed)
  Echocardiogram 2D Echocardiogram has been performed.  Russell Williamson M 07/02/2017, 8:09 AM

## 2017-07-02 NOTE — ED Notes (Addendum)
Pt. Ambulated down the hall and back to his room on 96% room air without difficulty and heart rate 97. Pt. Gait steady on his feet.

## 2017-07-02 NOTE — ED Notes (Signed)
Attempted to call report. Nurse unavailable at this time.

## 2017-07-02 NOTE — H&P (Signed)
History and Physical    Russell Williamson SWF:[id number] DOB: Jun 25, 1956 DOA: 07/01/2017  PCP: Harvie Junior, MD   Patient coming from: Home.  I have personally briefly reviewed patient's old medical records in [place]  Chief Complaint: Shortness of breath.  HPI: Russell Williamson is a 61 y.o. male with medical history significant of anemia, osteoarthritis, cervical adenopathy, chronic lower back pain, chronic pancreatitis, constipation, chronic dyspnea, GERD, COPD, chronic dyspnea was coming to the emergency department with a history of productive cough for one week associated with right-sided pleuritic chest pain for the past 3 days. He denies fever, but complains of chills and fatigue. He denies dizziness, diaphoresis, palpitations, pitting edema of the lower extremities or PND. Complains of chronic abdominal pain and constipation, denies melena, hematochezia or diarrhea. No dysuria, no frequency or hematuria. No polyuria polydipsia or blurred vision.  ED Course: Initial vital signs temperature 98.4, pulse 81, blood pressure 128/67 mmHg, respirations 20 and O2 sat 99% on room air.   Medications emergency department albuterol 10 mg continuous nebulization, DuoNeb 1, magnesium sulfate 2 g IVPB and prednisone 60 mg by mouth 1 dose.  Workup shows a checks everyday with COPD but no acute cardiopulmonary pathology. His WBC was 2.3, hemoglobin 13.4 grams per deciliter and platelets 133. His BMP showed a mildly elevated bicarbonate at 31 mmol/L, but all other values were normal. His EKG was sinus rhythm with multiform BPC's and all anterior infarct which is similar to previous tracing.  Review of Systems: As per HPI otherwise 10 point review of systems negative.    Past Medical History:  Diagnosis Date  . Anemia   . Arthritis   . Cervical adenopathy 04/26/2012  . Chronic back pain    lower  . Chronic pancreatitis ([place])   . Constipation   . COPD (chronic obstructive pulmonary  disease) ([place])   . Daily headache 05/23/2012   "small ones"  . Deficiency anemia 04/15/2016  . Dyspnea    with exertion  . G tube feedings (HCC)    hx of  feeding tube in stomach  . GERD (gastroesophageal reflux disease)   . History of blood transfusion ~ 2004   "from the pancreatitis"  . History of radiation therapy 02/02/2011-03/22/2011   head/neck,left tonsil  . Hx of radiation therapy 02/02/11 to 03/22/11   L tonsil  . Hypertension   . Insomnia   . 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 Pancytopenia ([place]) 02/28/2014  . Poor venous access 04/28/2016  . 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 Thrush 11/02/2013  . 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 Urinary hesitancy     Past Surgical History:  Procedure Laterality Date  . BILE DUCT STENT PLACEMENT     hx of  . CHOLECYSTECTOMY  04/2005  . DIRECT LARYNGOSCOPY  05/23/2012   Procedure: DIRECT LARYNGOSCOPY;  Surgeon: Rozetta Nunnery, MD;  Location: [facility];  Service: ENT;  Laterality: N/A;  . DIRECT LARYNGOSCOPY N/A 03/23/2013   Procedure: DIRECT LARYNGOSCOPY;  Surgeon: Rozetta Nunnery, MD;  Location: [facility];  Service: ENT;  Laterality: N/A;  . ESOPHAGEAL DILATION N/A 03/23/2013   Procedure: ESOPHAGEAL DILATION;  Surgeon: Rozetta Nunnery, MD;  Location: [facility];  Service: ENT;  Laterality: N/A;  . ESOPHAGOGASTRODUODENOSCOPY (EGD) WITH PROPOFOL N/A 03/01/2017   Procedure: ESOPHAGOGASTRODUODENOSCOPY (EGD) WITH PROPOFOL;  Surgeon: Ladene Artist, MD;  Location: [facility];  Service: Endoscopy;  Laterality: N/A;  . FLEXIBLE SIGMOIDOSCOPY N/A 03/01/2017   Procedure: FLEXIBLE SIGMOIDOSCOPY;  Surgeon: Ladene Artist, MD;  Location: [facility];  Service: Endoscopy;  Laterality: N/A;  . IR GENERIC HISTORICAL  04/29/2016   IR US GUIDE VASC ACCESS RIGHT 04/29/2016 [facility]  . IR GENERIC HISTORICAL  04/29/2016   IR FLUORO GUIDE CV LINE  RIGHT 04/29/2016 [facility]  . IR GENERIC HISTORICAL  05/12/2016   IR US GUIDE VASC ACCESS LEFT 05/12/2016 [facility]  . IR GENERIC HISTORICAL  05/12/2016   IR FLUORO GUIDE CV LINE LEFT 05/12/2016 [facility]  . IR GENERIC HISTORICAL  06/28/2016   IR US GUIDE VASC ACCESS RIGHT 06/28/2016 Sandi Mariscal, MD [facility]  . IR GENERIC HISTORICAL  06/28/2016   IR FLUORO GUIDE PORT INSERTION RIGHT 06/28/2016 Sandi Mariscal, MD [facility]  . IR REMOVAL TUN ACCESS W/ PORT W/O FL MOD SED  02/10/2017  . MASS BIOPSY  05/23/2012   Procedure: NECK MASS BIOPSY;  Surgeon: Rozetta Nunnery, MD;  Location: [facility];  Service: ENT;  Laterality: N/A;  . RADICAL NECK DISSECTION  05/23/2012   w/mass excision  . RADICAL NECK DISSECTION  05/23/2012   Procedure: RADICAL NECK DISSECTION;  Surgeon: Rozetta Nunnery, MD;  Location: [facility];  Service: ENT;  Laterality: Left;  . TIBIA FRACTURE SURGERY  1990's   left     reports that he has been smoking Cigarettes.  He has a 10.00 pack-year smoking history. He has never used smokeless tobacco. He reports that he does not drink alcohol or use drugs.  No Known Allergies  Family History  Problem Relation Age of Onset  . COPD Mother   . Colon cancer Neg Hx     Prior to Admission medications   Medication Sig Start Date End Date Taking? Authorizing Provider  amLODipine (NORVASC) 2.5 MG tablet Take 2.5 mg by mouth daily. 12/24/16  Yes [provider]  doxylamine, Sleep, (UNISOM) 25 MG tablet Take 50-75 mg by mouth at bedtime as needed for sleep (sleep).    Yes [provider]  folic acid (FOLVITE) 1 MG tablet Take 1 mg by mouth daily.  05/14/15  Yes [provider]  lipase/protease/amylase (CREON-12/PANCREASE) 12000 UNITS CPEP capsule Take 1 capsule by mouth 4 (four) times daily.   Yes [provider]  mirtazapine (REMERON) 30 MG tablet Take 30 mg by mouth at bedtime.   Yes [provider]  Omega-3 Fatty Acids (OMEGA-3 FISH OIL)  300 MG CAPS Take 300 mg by mouth daily.   Yes [provider]  ondansetron (ZOFRAN ODT) 4 MG disintegrating tablet Take 1 tablet (4 mg total) by mouth every 8 (eight) hours as needed for nausea or vomiting. 04/14/17  Yes Nanavati, Ankit, MD  oxyCODONE (ROXICODONE) 15 MG immediate release tablet Take 15 mg by mouth every 4 (four) hours as needed for pain.  06/25/16  Yes [provider]  pantoprazole (PROTONIX) 20 MG tablet Take 1 tablet (20 mg total) by mouth 2 (two) times daily. 10/07/16  Yes Gareth Morgan, MD  polyethylene glycol powder (MIRALAX) powder Take 17 g by mouth daily. 03/10/17  Yes Jola Schmidt, MD  PROAIR HFA 108 [phone] Base) MCG/ACT inhaler Inhale 2 puff into lungs every 4-6 hours as needed for shortness of breath/wheezing 02/02/16  Yes [provider]  promethazine (PHENERGAN) 25 MG tablet Take 1 tablet (25 mg total) by mouth every 6 (six) hours as needed for nausea  or vomiting. 10/07/16  Yes Schlossman, Junie Panning, MD  temazepam (RESTORIL) 30 MG capsule TAKE 1 CAPSULE ( 30 MG ) BY MOUTH EVERY NIGHT 12/03/16  Yes [provider]  traMADol (ULTRAM) 50 MG tablet Take 1 tablet (50 mg total) by mouth every 6 (six) hours as needed. Patient taking differently: Take 50 mg by mouth every 6 (six) hours as needed for moderate pain or severe pain.  10/22/16  Yes Levin Erp, PA  HYDROcodone-acetaminophen (NORCO/VICODIN) 5-325 MG tablet Take 1 tablet by mouth every 6 (six) hours as needed. 04/14/17   Varney Biles, MD  nicotine (NICODERM CQ - DOSED IN MG/24 HOURS) 21 mg/24hr patch Place 1 patch (21 mg total) onto the skin daily. Patient not taking: Reported on 03/10/2017 01/27/17   Heath Lark, MD    Physical Exam: Vitals:   07/01/17 2157 07/01/17 2305 07/02/17 0039 07/02/17 0227  BP: (!) 145/90   132/85  Pulse: (!) 56   94  Resp: 18   (!) 24  Temp:    97.8 F (36.6 C)  TempSrc:    Oral  SpO2: 100% 98% 98% 98%  Weight:      Height:        Constitutional:  Cachectic, but otherwise NAD, calm, comfortable Eyes: PERRL, lids and conjunctivae normal ENMT: Mucous membranes are moist. Posterior pharynx is mildly erythematous. Absent dentition.  Neck: normal, supple, no masses, no thyromegaly Respiratory: Decreased breath sounds with bilateral wheezing and rhonchi, no crackles. Normal respiratory effort. No accessory muscle use.  Cardiovascular: Regular rate and rhythm, no murmurs / rubs / gallops. No extremity edema. 2+ pedal pulses. No carotid bruits.  Abdomen: Soft, mild epigastric tenderness, no guarding or masses palpated. No hepatosplenomegaly. Bowel sounds positive.  Musculoskeletal: no clubbing / cyanosis. No joint deformity upper and lower extremities. Good ROM, no contractures. Normal muscle tone.  Skin: no significant rashes, lesions, ulcers on limited skin exam Neurologic: CN 2-12 grossly intact. Sensation intact, DTR normal. Strength 5/5 in all 4.  Psychiatric: Normal judgment and insight. Alert and oriented x 4. Normal mood.    Labs on Admission: I have personally reviewed following labs and imaging studies  CBC:  Recent Labs Lab 07/01/17 2308  WBC 2.3*  HGB 13.4  HCT 39.7  MCV 98.8  PLT 366*   Basic Metabolic Panel:  Recent Labs Lab 07/01/17 2308 07/02/17 0330  NA 139  --   K 4.1  --   CL 98*  --   CO2 31  --   GLUCOSE 93  --   BUN 9  --   CREATININE 0.53*  --   CALCIUM 9.4  --   MG  --  1.7  PHOS  --  2.9   GFR: Estimated Creatinine Clearance: 56 mL/min (A) (by C-G formula based on SCr of 0.53 mg/dL (L)). Liver Function Tests: No results for input(s): AST, ALT, ALKPHOS, BILITOT, PROT, ALBUMIN in the last 168 hours.  Recent Labs Lab 07/02/17 0330  LIPASE 17   No results for input(s): AMMONIA in the last 168 hours. Coagulation Profile: No results for input(s): INR, PROTIME in the last 168 hours. Cardiac Enzymes: No results for input(s): CKTOTAL, CKMB, CKMBINDEX, TROPONINI in the last 168 hours. BNP (last  3 results) No results for input(s): PROBNP in the last 8760 hours. HbA1C: No results for input(s): HGBA1C in the last 72 hours. CBG: No results for input(s): GLUCAP in the last 168 hours. Lipid Profile: No results for input(s): CHOL, HDL, LDLCALC, TRIG,  CHOLHDL, LDLDIRECT in the last 72 hours. Thyroid Function Tests: No results for input(s): TSH, T4TOTAL, FREET4, T3FREE, THYROIDAB in the last 72 hours. Anemia Panel: No results for input(s): VITAMINB12, FOLATE, FERRITIN, TIBC, IRON, RETICCTPCT in the last 72 hours. Urine analysis:    Component Value Date/Time   COLORURINE YELLOW 10/20/2016 0406   APPEARANCEUR CLEAR 10/20/2016 0406   LABSPEC 1.012 10/20/2016 0406   PHURINE 8.0 10/20/2016 0406   GLUCOSEU NEGATIVE 10/20/2016 0406   HGBUR NEGATIVE 10/20/2016 0406   BILIRUBINUR NEGATIVE 10/20/2016 0406   KETONESUR NEGATIVE 10/20/2016 0406   PROTEINUR NEGATIVE 10/20/2016 0406   UROBILINOGEN 0.2 08/31/2014 2350   NITRITE NEGATIVE 10/20/2016 0406   LEUKOCYTESUR NEGATIVE 10/20/2016 0406    Radiological Exams on Admission: Dg Chest 2 View  Result Date: 07/01/2017 CLINICAL DATA:  Chest pain, shortness of breath and cough for 2 days. History of COPD. EXAM: CHEST  2 VIEW COMPARISON:  Chest radiograph April 13, 2017 FINDINGS: Cardiomediastinal silhouette is normal. No pleural effusions or focal consolidations. Increased lung volumes with scarring, unchanged. Trachea projects midline and there is no pneumothorax. Soft tissue planes and included osseous structures are non-suspicious. Old bilateral rib fractures. Surgical clips in the included right abdomen compatible with cholecystectomy. IMPRESSION: COPD, no acute cardiopulmonary process. Electronically Signed   By: Elon Alas M.D.   On: 07/01/2017 14:57    EKG: Independently reviewed. Vent. rate 87 BPM PR interval * ms QRS duration 65 ms QT/QTc 366/441 ms P-R-T axes 90 86 79 Sinus rhythm Multiform ventricular premature  complexes Anterior infarct, old  Assessment/Plan Principal Problem:   COPD exacerbation (HCC) Observation/telemetry. Continue supplemental oxygen. Continue scheduled and as needed bronchodilators. Continue glucocorticoids.  Active Problems:   HTN (hypertension) Currently not on antihypertensives. Monitor blood pressure.    Hypothyroidism (acquired) Currently not on levothyroxine. Check TSH level.    Nicotine abuse Declined nicotine replacement therapy and smoking cessation information.    Relapsing chronic pancreatitis (HCC) Lipase level is normal. Analgesics as needed. Digestive enzymes supplementation with meals.    Chronic leukopenia Monitor WBC.    Abnormal EKG. Check echocardiogram.   DVT prophylaxis: Heparin SQ. Code Status: Full code. Family Communication: None at bedside. Disposition Plan: Observation for COPD exacerbation treatment. Consults called:  Admission status: Observation/telemetry.   Reubin Milan MD [facility] Pager [phone]  If 7PM-7AM, please contact night-coverage [email or web address] Password [facility]  07/02/2017, 5:53 AM

## 2017-07-03 DIAGNOSIS — E43 Unspecified severe protein-calorie malnutrition: Secondary | ICD-10-CM | POA: Insufficient documentation

## 2017-07-03 LAB — CBC WITH DIFFERENTIAL/PLATELET
BASOS PCT: 0 %
Basophils Absolute: 0 10*3/uL (ref 0.0–0.1)
Eosinophils Absolute: 0 10*3/uL (ref 0.0–0.7)
Eosinophils Relative: 0 %
HEMATOCRIT: 37.2 % — AB (ref 39.0–52.0)
Hemoglobin: 12.7 g/dL — ABNORMAL LOW (ref 13.0–17.0)
LYMPHS ABS: 0.6 10*3/uL — AB (ref 0.7–4.0)
Lymphocytes Relative: 12 %
MCH: 33.2 pg (ref 26.0–34.0)
MCHC: 34.1 g/dL (ref 30.0–36.0)
MCV: 97.1 fL (ref 78.0–100.0)
MONO ABS: 0.3 10*3/uL (ref 0.1–1.0)
MONOS PCT: 6 %
NEUTROS ABS: 3.9 10*3/uL (ref 1.7–7.7)
Neutrophils Relative %: 82 %
Platelets: 135 10*3/uL — ABNORMAL LOW (ref 150–400)
RBC: 3.83 MIL/uL — ABNORMAL LOW (ref 4.22–5.81)
RDW: 16.2 % — AB (ref 11.5–15.5)
WBC: 4.7 10*3/uL (ref 4.0–10.5)

## 2017-07-03 LAB — COMPREHENSIVE METABOLIC PANEL
ALBUMIN: 3.6 g/dL (ref 3.5–5.0)
ALK PHOS: 73 U/L (ref 38–126)
ALT: 20 U/L (ref 17–63)
AST: 30 U/L (ref 15–41)
Anion gap: 9 (ref 5–15)
BUN: 12 mg/dL (ref 6–20)
CALCIUM: 9.3 mg/dL (ref 8.9–10.3)
CHLORIDE: 98 mmol/L — AB (ref 101–111)
CO2: 29 mmol/L (ref 22–32)
CREATININE: 0.57 mg/dL — AB (ref 0.61–1.24)
GFR calc Af Amer: >60 mL/min (ref >60)
GFR calc non Af Amer: >60 mL/min (ref >60)
GLUCOSE: 145 mg/dL — AB (ref 65–99)
Potassium: 4.5 mmol/L (ref 3.5–5.1)
SODIUM: 136 mmol/L (ref 135–145)
Total Bilirubin: 0.6 mg/dL (ref 0.3–1.2)
Total Protein: 7.1 g/dL (ref 6.5–8.1)

## 2017-07-03 LAB — HIV ANTIBODY (ROUTINE TESTING W REFLEX): HIV Screen 4th Generation wRfx: NONREACTIVE

## 2017-07-03 MED ORDER — HYDROCODONE-ACETAMINOPHEN 5-325 MG PO TABS: 1.0000 | ORAL_TABLET | Freq: Four times a day (QID) | ORAL | 0 refills | Status: DC | PRN

## 2017-07-03 MED ORDER — PREDNISONE 10 MG PO TABS: ORAL_TABLET | ORAL | 0 refills | Status: DC

## 2017-07-03 MED ORDER — AZITHROMYCIN 250 MG PO TABS: 250.0000 mg | ORAL_TABLET | Freq: Every day | ORAL | 0 refills | Status: AC

## 2017-07-03 MED ORDER — NICOTINE 21 MG/24HR TD PT24: 21.0000 mg | MEDICATED_PATCH | Freq: Every day | TRANSDERMAL | 0 refills | Status: DC

## 2017-07-03 MED ORDER — ADULT MULTIVITAMIN LIQUID CH
15.0000 mL | Freq: Every day | ORAL | Status: DC
  Filled 2017-07-03: qty 15

## 2017-07-03 NOTE — Discharge Summary (Signed)
Physician Discharge Summary  Javone Ybanez JIR:[id number] DOB: 24-Dec-1955 DOA: 07/01/2017  PCP: Harvie Junior, MD  Admit date: 07/01/2017 Discharge date: 07/03/2017  Admitted From: Homeless Disposition:  Homeless, SW consulted for resources   Recommendations for Outpatient Follow-up:  1. Follow up with PCP in 1 week  Home Health: None  Equipment/Devices: Rolling walker   Discharge Condition: Stable CODE STATUS: Full  Diet recommendation: Heart healthy   Brief/Interim Summary: [name] a 62 y.o.malewith medical history significant of anemia, osteoarthritis, cervical adenopathy, chronic lower back pain, chronic pancreatitis, constipation, chronic dyspnea, GERD, COPD, chronic dyspnea; he came to the ED with a history of productive cough for one week associated with right-sided pleuritic chest pain for the past 3 days. He was admitted for COPD exacerbation. He was treated with nebs, steroids, Zithromax. Echocardiogram was completed which revealed EF 65-70% with no regional wall motion abnormalities. On day of discharge, he was on room air, breath sounds clear. He had many questions regarding discharge including his homelessness and medication affordability. Social worker and case manager assisted in this prior to discharge.  Discharge Diagnoses:  Principal Problem:   COPD exacerbation ([place]) Active Problems:   HTN (hypertension)   Hypothyroidism (acquired)   Nicotine abuse   Relapsing chronic pancreatitis ([facility])   Chronic leukopenia   Abnormal EKG   Protein-calorie malnutrition, severe  COPD exacerbation -Improved, continue proair, prednisone taper and a Zithromax -On room air   Essential hypertension -Continue norvasc  -BP stable   Chronic pancreatitis -No acute flare, very minimal abdominal pain, tolerating meals, no nausea or vomiting -Continue creon  -Provided very short supply of pain medications on discharge   Discharge Instructions  Discharge  Instructions    Call MD for:  difficulty breathing, headache or visual disturbances    Complete by:  As directed    Call MD for:  extreme fatigue    Complete by:  As directed    Call MD for:  hives    Complete by:  As directed    Call MD for:  persistant dizziness or light-headedness    Complete by:  As directed    Call MD for:  persistant nausea and vomiting    Complete by:  As directed    Call MD for:  severe uncontrolled pain    Complete by:  As directed    Call MD for:  temperature >100.4    Complete by:  As directed    Diet - low sodium heart healthy    Complete by:  As directed    Discharge instructions    Complete by:  As directed    You were cared for by a hospitalist during your hospital stay. If you have any questions about your discharge medications or the care you received while you were in the hospital after you are discharged, you can call the unit and asked to speak with the hospitalist on call if the hospitalist that took care of you is not available. Once you are discharged, your primary care physician will handle any further medical issues. Please note that NO REFILLS for any discharge medications will be authorized once you are discharged, as it is imperative that you return to your primary care physician (or establish a relationship with a primary care physician if you do not have one) for your aftercare needs so that they can reassess your need for medications and monitor your lab values.   Increase activity slowly    Complete by:  As directed  Allergies as of 07/03/2017   No Known Allergies     Medication List    STOP taking these medications   doxylamine (Sleep) 25 MG tablet Commonly known as:  UNISOM   oxyCODONE 15 MG immediate release tablet Commonly known as:  ROXICODONE     TAKE these medications   amLODipine 2.5 MG tablet Commonly known as:  NORVASC Take 2.5 mg by mouth daily.   azithromycin 250 MG tablet Commonly known as:  ZITHROMAX Take 1  tablet (250 mg total) by mouth daily.   folic acid 1 MG tablet Commonly known as:  FOLVITE Take 1 mg by mouth daily.   HYDROcodone-acetaminophen 5-325 MG tablet Commonly known as:  NORCO/VICODIN Take 1 tablet by mouth every 6 (six) hours as needed.   lipase/protease/amylase 12000 units Cpep capsule Commonly known as:  CREON Take 1 capsule by mouth 4 (four) times daily.   mirtazapine 30 MG tablet Commonly known as:  REMERON Take 30 mg by mouth at bedtime.   nicotine 21 mg/24hr patch Commonly known as:  NICODERM CQ - dosed in mg/24 hours Place 1 patch (21 mg total) onto the skin daily.   Omega-3 Fish Oil 300 MG Caps Take 300 mg by mouth daily.   ondansetron 4 MG disintegrating tablet Commonly known as:  ZOFRAN ODT Take 1 tablet (4 mg total) by mouth every 8 (eight) hours as needed for nausea or vomiting.   pantoprazole 20 MG tablet Commonly known as:  PROTONIX Take 1 tablet (20 mg total) by mouth 2 (two) times daily.   polyethylene glycol powder powder Commonly known as:  MIRALAX Take 17 g by mouth daily.   predniSONE 10 MG tablet Commonly known as:  DELTASONE Take 4 tabs for 3 days, then 3 tabs for 3 days, then 2 tabs for 3 days, then 1 tab for 3 days, then 1/2 tab for 4 days.   PROAIR HFA 108 (90 Base) MCG/ACT inhaler Generic drug:  albuterol Inhale 2 puff into lungs every 4-6 hours as needed for shortness of breath/wheezing   promethazine 25 MG tablet Commonly known as:  PHENERGAN Take 1 tablet (25 mg total) by mouth every 6 (six) hours as needed for nausea or vomiting.   temazepam 30 MG capsule Commonly known as:  RESTORIL TAKE 1 CAPSULE ( 30 MG ) BY MOUTH EVERY NIGHT   traMADol 50 MG tablet Commonly known as:  ULTRAM Take 1 tablet (50 mg total) by mouth every 6 (six) hours as needed. What changed:  reasons to take this            Durable Medical Equipment        Start     Ordered   07/03/17 1054  For home use only DME Walker rolling  Once     Question:  Patient needs a walker to treat with the following condition  Answer:  Weakness   07/03/17 1053       Discharge Care Instructions        Start     Ordered   07/03/17 0000  nicotine (NICODERM CQ - DOSED IN MG/24 HOURS) 21 mg/24hr patch  Daily     07/03/17 1052   07/03/17 0000  azithromycin (ZITHROMAX) 250 MG tablet  Daily     07/03/17 1052   07/03/17 0000  HYDROcodone-acetaminophen (NORCO/VICODIN) 5-325 MG tablet  Every 6 hours PRN     07/03/17 1052   07/03/17 0000  Increase activity slowly     07/03/17 1052  07/03/17 0000  Diet - low sodium heart healthy     07/03/17 1052   07/03/17 0000  Discharge instructions    Comments:  You were cared for by a hospitalist during your hospital stay. If you have any questions about your discharge medications or the care you received while you were in the hospital after you are discharged, you can call the unit and asked to speak with the hospitalist on call if the hospitalist that took care of you is not available. Once you are discharged, your primary care physician will handle any further medical issues. Please note that NO REFILLS for any discharge medications will be authorized once you are discharged, as it is imperative that you return to your primary care physician (or establish a relationship with a primary care physician if you do not have one) for your aftercare needs so that they can reassess your need for medications and monitor your lab values.   07/03/17 1052   07/03/17 0000  Call MD for:  extreme fatigue     07/03/17 1052   07/03/17 0000  Call MD for:  persistant dizziness or light-headedness     07/03/17 1052   07/03/17 0000  Call MD for:  hives     07/03/17 1052   07/03/17 0000  Call MD for:  difficulty breathing, headache or visual disturbances     07/03/17 1052   07/03/17 0000  Call MD for:  severe uncontrolled pain     07/03/17 1052   07/03/17 0000  Call MD for:  persistant nausea and vomiting     07/03/17 1052    07/03/17 0000  Call MD for:  temperature >100.4     07/03/17 1052   07/03/17 0000  predniSONE (DELTASONE) 10 MG tablet     07/03/17 1052     Follow-up Information    Harvie Junior, MD. Schedule an appointment as soon as possible for a visit in 1 week(s).   Specialty:  Family Medicine Contact information: [place] [address] [phone]          No Known Allergies  Consultations:  None   Procedures/Studies: Dg Chest 2 View  Result Date: 07/01/2017 CLINICAL DATA:  Chest pain, shortness of breath and cough for 2 days. History of COPD. EXAM: CHEST  2 VIEW COMPARISON:  Chest radiograph April 13, 2017 FINDINGS: Cardiomediastinal silhouette is normal. No pleural effusions or focal consolidations. Increased lung volumes with scarring, unchanged. Trachea projects midline and there is no pneumothorax. Soft tissue planes and included osseous structures are non-suspicious. Old bilateral rib fractures. Surgical clips in the included right abdomen compatible with cholecystectomy. IMPRESSION: COPD, no acute cardiopulmonary process. Electronically Signed   By: Elon Alas M.D.   On: 07/01/2017 14:57    Echo  Study Conclusions  - Left ventricle: The cavity size was normal. Wall thickness was   normal. Systolic function was vigorous. The estimated ejection   fraction was in the range of 65% to 70%. Wall motion was normal;   there were no regional wall motion abnormalities. Doppler   parameters are consistent with abnormal left ventricular  relaxation (grade 1 diastolic dysfunction).   Discharge Exam: Vitals:   07/03/17 0754 07/03/17 1339  BP:  117/68  Pulse:  78  Resp:  16  Temp:  98.2 F (36.8 C)  SpO2: 99% 94%   Vitals:   07/02/17 2112 07/03/17 0600 07/03/17 0754 07/03/17 1339  BP: 127/64 138/85  117/68  Pulse: 65  89  78  Resp: 20   16  Temp: 98.5 F (36.9 C) 97.7 F (36.5 C)  98.2 F (36.8 C)  TempSrc: Oral Oral  Oral  SpO2: 94% 90%  99% 94%  Weight:      Height:        General: Pt is alert, awake, not in acute distress Cardiovascular: RRR, S1/S2 +, no rubs, no gallops Respiratory: CTA bilaterally, no wheezing, no rhonchi, no resp distress, no conversational dyspnea, on room air  Abdominal: Soft, NT, ND, bowel sounds + Extremities: no edema, no cyanosis    The results of significant diagnostics from this hospitalization (including imaging, microbiology, ancillary and laboratory) are listed below for reference.     Microbiology: Recent Results (from the past 240 hour(s))  MRSA PCR Screening     Status: Abnormal   Collection Time: 07/02/17 10:44 AM  Result Value Ref Range Status   MRSA by PCR POSITIVE (A) NEGATIVE Final    Comment:        The GeneXpert MRSA Assay (FDA approved for NASAL specimens only), is one component of a comprehensive MRSA colonization surveillance program. It is not intended to diagnose MRSA infection nor to guide or monitor treatment for MRSA infections. RESULT CALLED TO, READ BACK BY AND VERIFIED WITH: JIMERSON,C AT [phone] BY HOOKER,B      Labs: BNP (last 3 results) No results for input(s): BNP in the last 8760 hours. Basic Metabolic Panel:  Recent Labs Lab 07/01/17 2308 07/02/17 0330 07/03/17 0706  NA 139  --  136  K 4.1  --  4.5  CL 98*  --  98*  CO2 31  --  29  GLUCOSE 93  --  145*  BUN 9  --  12  CREATININE 0.53*  --  0.57*  CALCIUM 9.4  --  9.3  MG  --  1.7  --   PHOS  --  2.9  --    Liver Function Tests:  Recent Labs Lab 07/03/17 0706  AST 30  ALT 20  ALKPHOS 73  BILITOT 0.6  PROT 7.1  ALBUMIN 3.6    Recent Labs Lab 07/02/17 0330  LIPASE 17   No results for input(s): AMMONIA in the last 168 hours. CBC:  Recent Labs Lab 07/01/17 2308 07/03/17 0706  WBC 2.3* 4.7  NEUTROABS  --  3.9  HGB 13.4 12.7*  HCT 39.7 37.2*  MCV 98.8 97.1  PLT 133* 135*   Cardiac Enzymes: No results for input(s): CKTOTAL, CKMB, CKMBINDEX, TROPONINI in  the last 168 hours. BNP: Invalid input(s): POCBNP CBG: No results for input(s): GLUCAP in the last 168 hours. D-Dimer No results for input(s): DDIMER in the last 72 hours. Hgb A1c No results for input(s): HGBA1C in the last 72 hours. Lipid Profile No results for input(s): CHOL, HDL, LDLCALC, TRIG, CHOLHDL, LDLDIRECT in the last 72 hours. Thyroid function studies No results for input(s): TSH, T4TOTAL, T3FREE, THYROIDAB in the last 72 hours.  Invalid input(s): FREET3 Anemia work up No results for input(s): VITAMINB12, FOLATE, FERRITIN, TIBC, IRON, RETICCTPCT in the last 72 hours. Urinalysis    Component Value Date/Time   COLORURINE YELLOW 10/20/2016 0406   APPEARANCEUR CLEAR 10/20/2016 0406   LABSPEC 1.012 10/20/2016 0406   PHURINE 8.0 10/20/2016 0406   GLUCOSEU NEGATIVE 10/20/2016 0406   HGBUR NEGATIVE 10/20/2016 0406   BILIRUBINUR NEGATIVE 10/20/2016 0406   KETONESUR NEGATIVE 10/20/2016 0406   PROTEINUR NEGATIVE 10/20/2016 0406   UROBILINOGEN 0.2 08/31/2014 2350  NITRITE NEGATIVE 10/20/2016 0406   LEUKOCYTESUR NEGATIVE 10/20/2016 0406   Sepsis Labs Invalid input(s): PROCALCITONIN,  WBC,  LACTICIDVEN Microbiology Recent Results (from the past 240 hour(s))  MRSA PCR Screening     Status: Abnormal   Collection Time: 07/02/17 10:44 AM  Result Value Ref Range Status   MRSA by PCR POSITIVE (A) NEGATIVE Final    Comment:        The GeneXpert MRSA Assay (FDA approved for NASAL specimens only), is one component of a comprehensive MRSA colonization surveillance program. It is not intended to diagnose MRSA infection nor to guide or monitor treatment for MRSA infections. RESULT CALLED TO, READ BACK BY AND VERIFIED WITH: JIMERSON,C AT [phone] BY HOOKER,B      Time coordinating discharge: 40 minutes  SIGNED:  Dessa Phi, DO [facility] Pager [phone]  If 7PM-7AM, please contact night-coverage [email or web address] Password [email or web address] 07/03/2017, 4:02 PM

## 2017-07-03 NOTE — Evaluation (Signed)
Physical Therapy Evaluation Patient Details Name: Russell Williamson MRN: [id number] DOB: June 26, 1956 Today's Date: 07/03/2017   History of Present Illness   Russell Williamson is a 61 y.o. male with medical history significant of anemia, osteoarthritis, cervical adenopathy, chronic lower back pain, chronic pancreatitis, constipation, chronic dyspnea, GERD, COPD, chronic dyspnea was coming to the emergency department with a history of productive cough for one week associated with right-sided pleuritic chest pain for the past 3 days.  Clinical Impression  Pt admitted with above diagnosis. Pt currently with functional limitations due to the deficits listed below (see PT Problem List).  Pt will benefit from skilled PT to increase their independence and safety with mobility.  Pt with difficult d/c disposition as he reports he is currently homeless.  He is safer ambulating with RW and he states that it is difficult to use out on the streets.  Recommend RW and encouraged him to use it.  Spoke with SW regarding this pt. Pt would benefit from further PT for strengthening and balance if possible. o2 sat > 92% throughout session.       Follow Up Recommendations Other (comment) (Would benefit from post acute PT if possible)    Equipment Recommendations  Rolling walker with 5" wheels    Recommendations for Other Services Other (comment) (social work)     Precautions / Restrictions Precautions Precautions: Fall Restrictions Weight Bearing Restrictions: No      Mobility  Bed Mobility Overal bed mobility: Independent                Transfers Overall transfer level: Needs assistance   Transfers: Sit to/from Stand Sit to Stand: Supervision         General transfer comment: S for safety  Ambulation/Gait Ambulation/Gait assistance: Min guard Ambulation Distance (Feet): 120 Feet Assistive device: Rolling walker (2 wheeled) Gait Pattern/deviations: Decreased stance time - left;Decreased  step length - right;Decreased step length - left     General Gait Details: Pt ambulated in room with cane and was reaching out for wall and bed.  Switched to RW and safer although does continue to show weakness with almost a bounce in his knees at times.  Heavy reliance on RW.  Stairs            Wheelchair Mobility    Modified Rankin (Stroke Patients Only)       Balance Overall balance assessment: Needs assistance   Sitting balance-Leahy Scale: Good       Standing balance-Leahy Scale: Fair Standing balance comment: Fair static                             Pertinent Vitals/Pain Pain Assessment: 0-10 Pain Score: 3  Pain Location: pancreas Pain Descriptors / Indicators: Aching Pain Intervention(s): Limited activity within patient's tolerance;Monitored during session    Home Living Family/patient expects to be discharged to:: Unsure ("[facility] then go back to [place]")                 Additional Comments: Pt reports he cannot go back to where he was living.  He has spoken to someone at a shelter (prior to hospital admission), but would also like to go back to [place] and live with his brother.     Prior Function Level of Independence: Independent with assistive device(s)         Comments: Amb with cane. He thinks he may have  a RW, but may be in storage and can't get to it due to family issues.  Pt states he can't use the RW out on the streets.     Hand Dominance        Extremity/Trunk Assessment   Upper Extremity Assessment Upper Extremity Assessment: Overall WFL for tasks assessed    Lower Extremity Assessment Lower Extremity Assessment: Generalized weakness       Communication   Communication: No difficulties  Cognition Arousal/Alertness: Awake/alert Behavior During Therapy: WFL for tasks assessed/performed Overall Cognitive Status: Within Functional Limits for tasks assessed                                         General Comments General comments (skin integrity, edema, etc.): o2 remianed > 90% throughout session.    Exercises     Assessment/Plan    PT Assessment Patient needs continued PT services  PT Problem List Decreased strength;Decreased balance;Decreased mobility;Decreased knowledge of use of DME       PT Treatment Interventions DME instruction;Gait training;Functional mobility training;Therapeutic activities;Therapeutic exercise    PT Goals (Current goals can be found in the Care Plan section)  Acute Rehab PT Goals Patient Stated Goal: Go back to [place] and stay with his brother PT Goal Formulation: With patient Time For Goal Achievement: 07/17/17 Potential to Achieve Goals: Good    Frequency Min 3X/week   Barriers to discharge Other (comment) reports he has no where to go to, homeless    Co-evaluation               AM-PAC PT "6 Clicks" Daily Activity  Outcome Measure Difficulty turning over in bed (including adjusting bedclothes, sheets and blankets)?: None Difficulty moving from lying on back to sitting on the side of the bed? : None Difficulty sitting down on and standing up from a chair with arms (e.g., wheelchair, bedside commode, etc,.)?: A Little Help needed moving to and from a bed to chair (including a wheelchair)?: A Little Help needed walking in hospital room?: A Little Help needed climbing 3-5 steps with a railing? : A Little 6 Click Score: 20    End of Session Equipment Utilized During Treatment: Gait belt Activity Tolerance: Patient tolerated treatment well Patient left: in chair;with call bell/phone within reach Nurse Communication: Mobility status PT Visit Diagnosis: Unsteadiness on feet (R26.81);Difficulty in walking, not elsewhere classified (R26.2)    Time: 9242-6834 PT Time Calculation (min) (ACUTE ONLY): 26 min   Charges:   PT Evaluation $PT Eval Moderate Complexity: 1 Mod PT Treatments $Gait Training:  8-22 mins   PT G Codes:   PT G-Codes **NOT FOR INPATIENT CLASS** Functional Assessment Tool Used: AM-PAC 6 Clicks Basic Mobility Functional Limitation: Mobility: Walking and moving around Mobility: Walking and Moving Around Current Status (H9622): At least 20 percent but less than 40 percent impaired, limited or restricted Mobility: Walking and Moving Around Goal Status [phone]): At least 1 percent but less than 20 percent impaired, limited or restricted    Santiago Glad L. Tamala Julian, [place] Pager [phone] 07/03/2017   Galen Manila 07/03/2017, 10:23 AM

## 2017-07-03 NOTE — Discharge Instructions (Signed)
Chronic Obstructive Pulmonary Disease Chronic obstructive pulmonary disease (COPD) is a common lung condition in which airflow from the lungs is limited. COPD is a general term that can be used to describe many different lung problems that limit airflow, including both chronic bronchitis and emphysema. If you have COPD, your lung function will probably never return to normal, but there are measures you can take to improve lung function and make yourself feel better. What are the causes?  Smoking (common).  Exposure to secondhand smoke.  Genetic problems.  Chronic inflammatory lung diseases or recurrent infections. What are the signs or symptoms?  Shortness of breath, especially with physical activity.  Deep, persistent (chronic) cough with a large amount of thick mucus.  Wheezing.  Rapid breaths (tachypnea).  Gray or bluish discoloration (cyanosis) of the skin, especially in your fingers, toes, or lips.  Fatigue.  Weight loss.  Frequent infections or episodes when breathing symptoms become much worse (exacerbations).  Chest tightness. How is this diagnosed? Your health care provider will take a medical history and perform a physical examination to diagnose COPD. Additional tests for COPD may include:  Lung (pulmonary) function tests.  Chest X-ray.  CT scan.  Blood tests. How is this treated? Treatment for COPD may include:  Inhaler and nebulizer medicines. These help manage the symptoms of COPD and make your breathing more comfortable.  Supplemental oxygen. Supplemental oxygen is only helpful if you have a low oxygen level in your blood.  Exercise and physical activity. These are beneficial for nearly all people with COPD.  Lung surgery or transplant.  Nutrition therapy to gain weight, if you are underweight.  Pulmonary rehabilitation. This may involve working with a team of health care providers and specialists, such as respiratory, occupational, and physical  therapists. Follow these instructions at home:  Take all medicines (inhaled or pills) as directed by your health care provider.  Avoid over-the-counter medicines or cough syrups that dry up your airway (such as antihistamines) and slow down the elimination of secretions unless instructed otherwise by your health care provider.  If you are a smoker, the most important thing that you can do is stop smoking. Continuing to smoke will cause further lung damage and breathing trouble. Ask your health care provider for help with quitting smoking. He or she can direct you to community resources or hospitals that provide support.  Avoid exposure to irritants such as smoke, chemicals, and fumes that aggravate your breathing.  Use oxygen therapy and pulmonary rehabilitation if directed by your health care provider. If you require home oxygen therapy, ask your health care provider whether you should purchase a pulse oximeter to measure your oxygen level at home.  Avoid contact with individuals who have a contagious illness.  Avoid extreme temperature and humidity changes.  Eat healthy foods. Eating smaller, more frequent meals and resting before meals may help you maintain your strength.  Stay active, but balance activity with periods of rest. Exercise and physical activity will help you maintain your ability to do things you want to do.  Preventing infection and hospitalization is very important when you have COPD. Make sure to receive all the vaccines your health care provider recommends, especially the pneumococcal and influenza vaccines. Ask your health care provider whether you need a pneumonia vaccine.  Learn and use relaxation techniques to manage stress.  Learn and use controlled breathing techniques as directed by your health care provider. Controlled breathing techniques include: 1. Pursed lip breathing. Start by breathing in (inhaling)   through your nose for 1 second. Then, purse your lips as  if you were going to whistle and breathe out (exhale) through the pursed lips for 2 seconds. 2. Diaphragmatic breathing. Start by putting one hand on your abdomen just above your waist. Inhale slowly through your nose. The hand on your abdomen should move out. Then purse your lips and exhale slowly. You should be able to feel the hand on your abdomen moving in as you exhale.  Learn and use controlled coughing to clear mucus from your lungs. Controlled coughing is a series of short, progressive coughs. The steps of controlled coughing are: 1. Lean your head slightly forward. 2. Breathe in deeply using diaphragmatic breathing. 3. Try to hold your breath for 3 seconds. 4. Keep your mouth slightly open while coughing twice. 5. Spit any mucus out into a tissue. 6. Rest and repeat the steps once or twice as needed. Contact a health care provider if:  You are coughing up more mucus than usual.  There is a change in the color or thickness of your mucus.  Your breathing is more labored than usual.  Your breathing is faster than usual. Get help right away if:  You have shortness of breath while you are resting.  You have shortness of breath that prevents you from:  Being able to talk.  Performing your usual physical activities.  You have chest pain lasting longer than 5 minutes.  Your skin color is more cyanotic than usual.  You measure low oxygen saturations for longer than 5 minutes with a pulse oximeter. This information is not intended to replace advice given to you by your health care provider. Make sure you discuss any questions you have with your health care provider. Document Released: 06/30/2005 Document Revised: 02/26/2016 Document Reviewed: 05/17/2013 Elsevier Interactive Patient Education  2017 Elsevier Inc.  

## 2017-07-03 NOTE — Progress Notes (Signed)
Pt is discharged to home. Dc instructions given. Prescriptions also given. No concerns voiced. Left unit in wheelchair pushed by nurse tech. Left in stable condition. Hale Bogus.

## 2017-07-03 NOTE — Care Management Note (Signed)
Case Management Note  Patient Details  Name: Russell Williamson MRN: [id number] Date of Birth: [birth year]  Subjective/Objective:    COPD exacerbation                Action/Plan: Discharge Planning: NCM spoke to pt and states he used [employer] on [employer]. States he has Medicaid and will receive his check on tomorrow to get his medications. He will stay at the [facility]. States he uses he cane, has a RW in storage.   PCP York Ram M MD  Expected Discharge Date:  07/03/17               Expected Discharge Plan:  Homeless Shelter  In-House Referral:  Clinical Social Work  Discharge planning Services  CM Consult, Medication Assistance  Post Acute Care Choice:  NA Choice offered to:  NA  DME Arranged:  N/A DME Agency:  NA  HH Arranged:  NA HH Agency:  NA  Status of Service:  Completed, signed off  If discussed at [employer], dates discussed:    Additional Comments:  Erenest Rasher, RN 07/03/2017, 2:30 PM

## 2017-07-03 NOTE — Progress Notes (Signed)
CSW received consult for homelessness. CSW met with patient to discuss current living situation and his plans at discharge. Patient acknowledged that he had been living with his sister previously, but they were kicked out of their house and he had been trying to locate shelter through [employer]. Patient has contact information for social worker through [employer] that is helping him locate shelter, but it is not guaranteed at this time. CSW provided list of homeless shelters in the [place] area. Patient concerned about how he would be able to get to the shelter because he doesn't have transportation; CSW provided bus pass.   Patient also concerned about being able to afford his medications; CSW alerted RNCM to discuss with patient.  CSW signing off.  Laveda Abbe, [place] Clinical Social Worker [phone]

## 2017-07-03 NOTE — Progress Notes (Signed)
Initial Nutrition Assessment  DOCUMENTATION CODES:   Severe malnutrition in context of chronic illness  INTERVENTION:   Ensure Enlive po BID, each supplement provides 350 kcal and 20 grams of protein  MVI  Dysphagia 1 diet as pt has difficulty swallowing   NUTRITION DIAGNOSIS:   Malnutrition (severe) related to chronic illness (COPD) as evidenced by severe depletion of body fat, severe depletion of muscle mass.  GOAL:   Patient will meet greater than or equal to 90% of their needs  MONITOR:   PO intake, Supplement acceptance, Labs, Weight trends  REASON FOR ASSESSMENT:   Consult Assessment of nutrition requirement/status  ASSESSMENT:   61 y.o.malewith medical history significant of anemia, osteoarthritis, cervical adenopathy, chronic lower back pain, chronic pancreatitis, constipation, chronic dyspnea, GERD, COPD, chronic dyspnea; he came to the ED with a history of productive cough for one week associated with right-sided pleuritic chest pain for the past 3 days. He was admitted for COPD exacerbation.   Met with pt in room today. Pt reports good appetite and oral intake pta. Pt documented to be eating <25% of meals. Pt reports history of throat cancer and difficulty swallowing; pt requesting pureed diet. Per chart, pt has been wt stable for two months. Pt does drink Ensure (vanilla or strawberry). Pt to possibly discharge today.    Medications reviewed and include: azithromycin, folic acid, heparin, solu-medrol, protonix, miralax, hydrocodone   Labs reviewed: Cl 98(L), creat 0.57(L), P 2.9 wnl, Mg 1.7 wnl  Nutrition-Focused physical exam completed. Findings are severe fat and muscle depletions over entire body, and no edema.   Diet Order:  Diet - low sodium heart healthy DIET - DYS 1 Room service appropriate? Yes; Fluid consistency: Thin  Skin:  Reviewed, no issues  Last BM:  9/28  Height:   Ht Readings from Last 1 Encounters:  07/01/17 _0  (1.676 m)     Weight:   Wt Readings from Last 1 Encounters:  07/02/17 94 lb 9.6 oz (42.9 kg)    Ideal Body Weight:  64.5 kg  BMI:  Body mass index is 15.27 kg/m.  Estimated Nutritional Needs:   Kcal:  1300-1500kcal/day   Protein:  68-77g/day   Fluid:  >1.3L/day   EDUCATION NEEDS:   No education needs identified at this time  Koleen Distance MS, RD, [place] Pager #[phone] After Hours Pager: [phone]

## 2017-07-03 NOTE — Progress Notes (Signed)
SATURATION QUALIFICATIONS: (This note is used to comply with regulatory documentation for home oxygen)  Patient Saturations on Room Air at Rest = 95%  Patient Saturations on Room Air while Ambulating = 92%  Patient Saturations on  Liters of oxygen while Ambulating = n/a

## 2017-08-11 IMAGING — CR DG CHEST 2V
2 series · 2 of 2 positions shown · non-contrast
Comparison: PA and lateral chest x-ray October 07, 2016

CLINICAL DATA: Three days of mid upper abdominal pain associated
with productive cough. History of COPD, chronic pancreatitis,
tonsillar malignancy, current smoker.

EXAM:
CHEST  2 VIEW

[w chest lat]
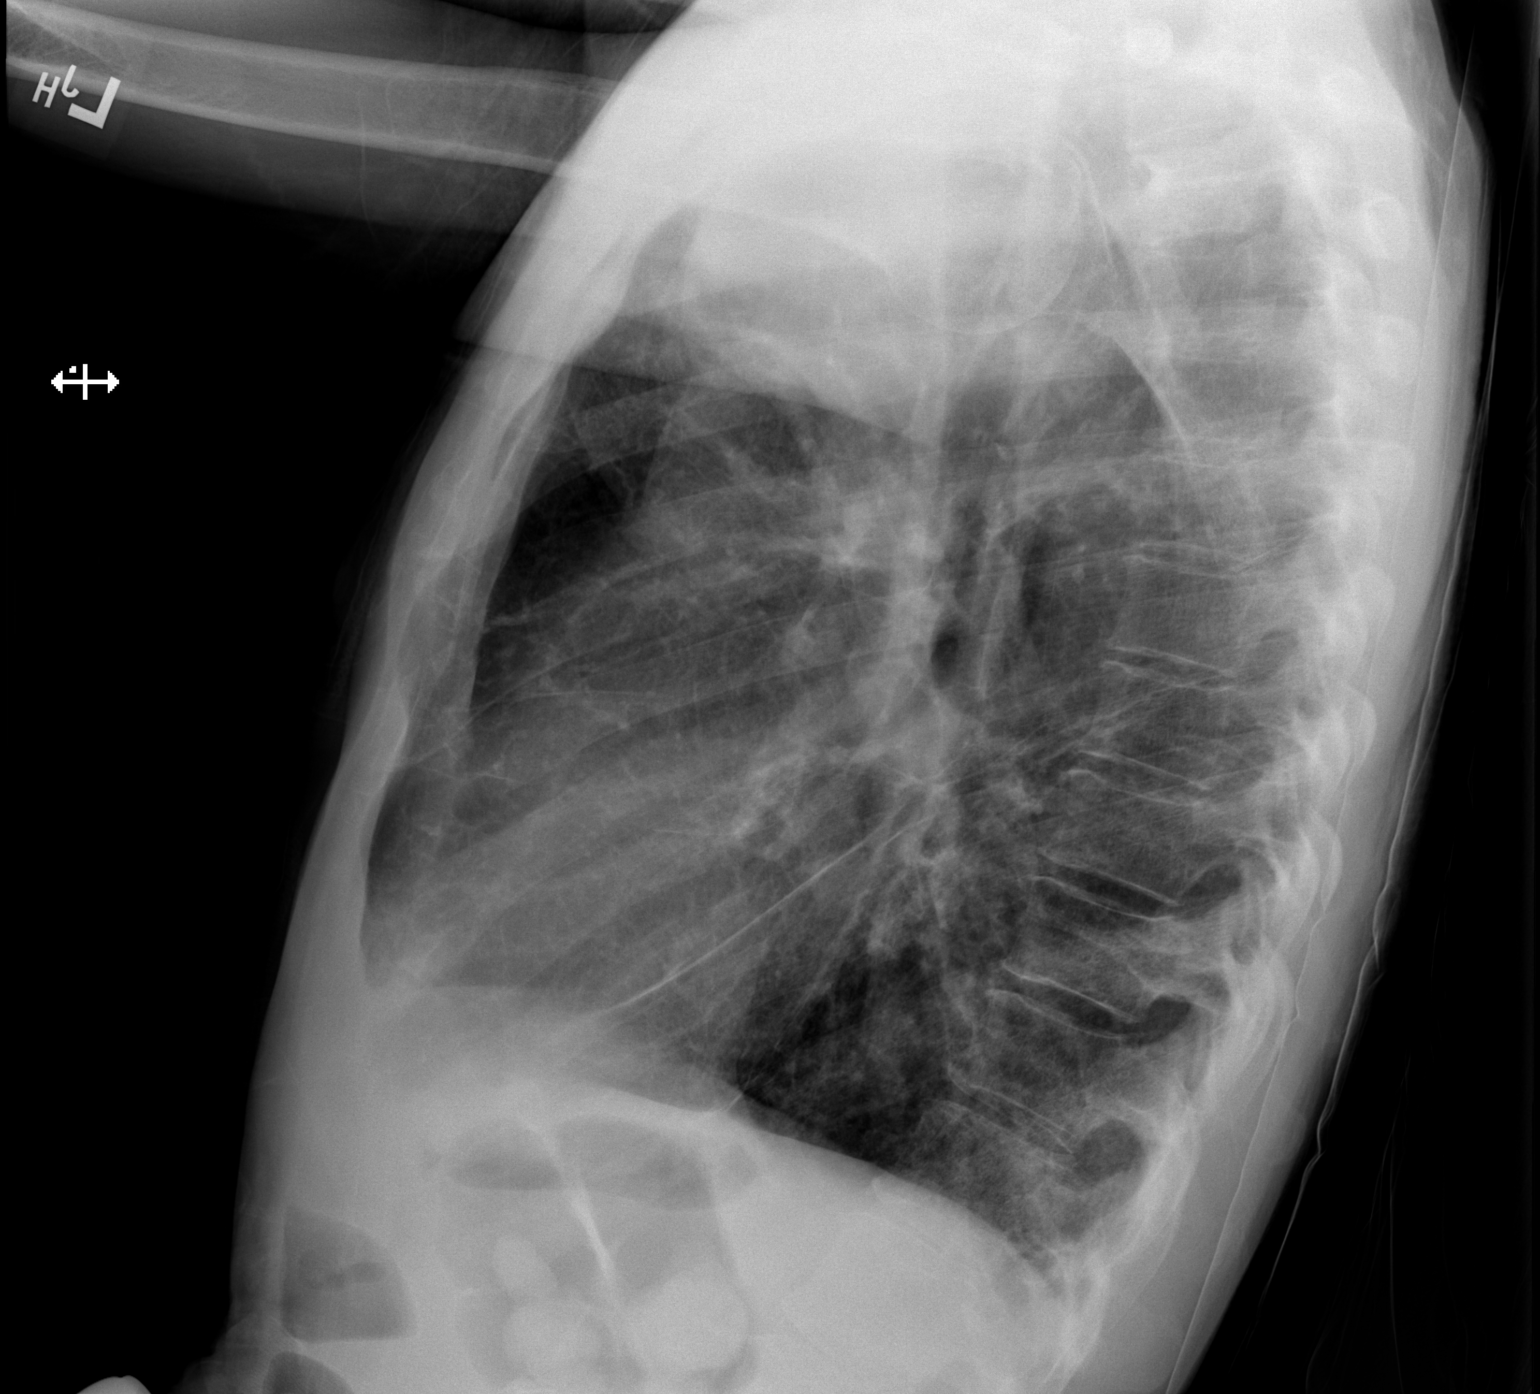

[x chest ap]
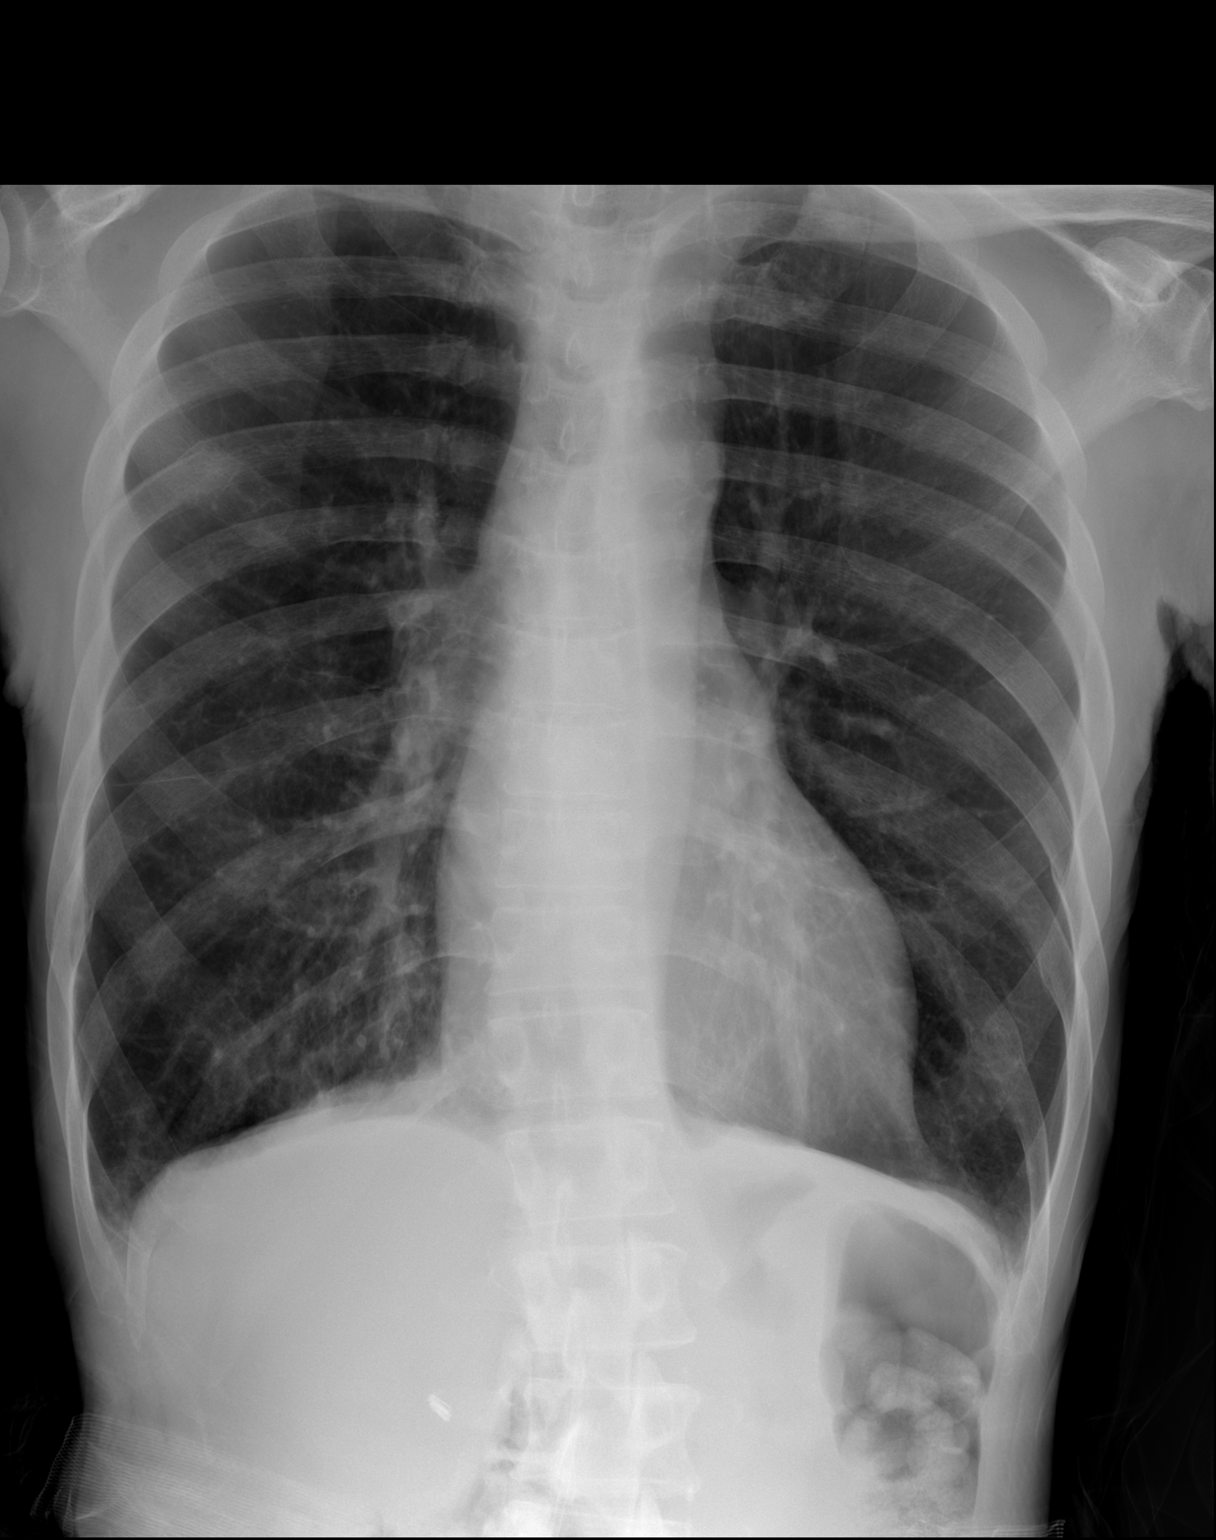

[2 of 2 positions shown; findings below may reference images not displayed]

FINDINGS: The lungs are hyperinflated. The lung markings are coarse in the
right infrahilar region and are more conspicuous than in the past.
The heart and pulmonary vascularity are normal. The mediastinum is
normal in width. The trachea is midline. The bony thorax exhibits no
acute abnormality.
IMPRESSION: COPD. Increased density in the right lower lobe compatible with
early interstitial pneumonia. Followup PA and lateral chest X-ray is
recommended in 3-4 weeks following trial of antibiotic therapy to
ensure resolution and exclude underlying malignancy.

## 2018-01-10 ENCOUNTER — Encounter (HOSPITAL_COMMUNITY): Payer: Self-pay

## 2018-01-10 DIAGNOSIS — J449 Chronic obstructive pulmonary disease, unspecified: Secondary | ICD-10-CM | POA: Insufficient documentation

## 2018-01-10 DIAGNOSIS — Z85819 Personal history of malignant neoplasm of unspecified site of lip, oral cavity, and pharynx: Secondary | ICD-10-CM | POA: Diagnosis not present

## 2018-01-10 DIAGNOSIS — Z79899 Other long term (current) drug therapy: Secondary | ICD-10-CM | POA: Insufficient documentation

## 2018-01-10 DIAGNOSIS — R1013 Epigastric pain: Secondary | ICD-10-CM | POA: Diagnosis present

## 2018-01-10 DIAGNOSIS — F17210 Nicotine dependence, cigarettes, uncomplicated: Secondary | ICD-10-CM | POA: Insufficient documentation

## 2018-01-10 DIAGNOSIS — R110 Nausea: Secondary | ICD-10-CM | POA: Insufficient documentation

## 2018-01-10 DIAGNOSIS — K869 Disease of pancreas, unspecified: Secondary | ICD-10-CM | POA: Diagnosis not present

## 2018-01-10 DIAGNOSIS — G893 Neoplasm related pain (acute) (chronic): Secondary | ICD-10-CM | POA: Diagnosis not present

## 2018-01-10 DIAGNOSIS — F1110 Opioid abuse, uncomplicated: Secondary | ICD-10-CM | POA: Insufficient documentation

## 2018-01-10 NOTE — ED Triage Notes (Signed)
Pt states he is out of his pain medicine

## 2018-01-10 NOTE — ED Triage Notes (Signed)
Pt complains of pancreatitis and neck pain for one week Pt has cancer in his neck

## 2018-01-11 ENCOUNTER — Emergency Department (HOSPITAL_COMMUNITY)
Admission: EM | Admit: 2018-01-11 | Discharge: 2018-01-11 | Disposition: A | Discharge status: Home / Self Care | Payer: Medicaid Other | Attending: Emergency Medicine | Admitting: Emergency Medicine

## 2018-01-11 DIAGNOSIS — G893 Neoplasm related pain (acute) (chronic): Secondary | ICD-10-CM

## 2018-01-11 DIAGNOSIS — R109 Unspecified abdominal pain: Secondary | ICD-10-CM

## 2018-01-11 DIAGNOSIS — G8929 Other chronic pain: Secondary | ICD-10-CM

## 2018-01-11 DIAGNOSIS — F1190 Opioid use, unspecified, uncomplicated: Secondary | ICD-10-CM

## 2018-01-11 LAB — CBC
HEMATOCRIT: 39.2 % (ref 39.0–52.0)
HEMOGLOBIN: 12.6 g/dL — AB (ref 13.0–17.0)
MCH: 31.7 pg (ref 26.0–34.0)
MCHC: 32.1 g/dL (ref 30.0–36.0)
MCV: 98.5 fL (ref 78.0–100.0)
Platelets: 228 10*3/uL (ref 150–400)
RBC: 3.98 MIL/uL — ABNORMAL LOW (ref 4.22–5.81)
RDW: 16.3 % — ABNORMAL HIGH (ref 11.5–15.5)
WBC: 3.1 10*3/uL — ABNORMAL LOW (ref 4.0–10.5)

## 2018-01-11 LAB — URINALYSIS, ROUTINE W REFLEX MICROSCOPIC
BILIRUBIN URINE: NEGATIVE
Glucose, UA: NEGATIVE mg/dL
Hgb urine dipstick: NEGATIVE
Ketones, ur: NEGATIVE mg/dL
Leukocytes, UA: NEGATIVE
NITRITE: NEGATIVE
PH: 8 (ref 5.0–8.0)
Protein, ur: NEGATIVE mg/dL
SPECIFIC GRAVITY, URINE: 1.011 (ref 1.005–1.030)

## 2018-01-11 LAB — COMPREHENSIVE METABOLIC PANEL
ALBUMIN: 2.9 g/dL — AB (ref 3.5–5.0)
ALT: 25 U/L (ref 17–63)
ANION GAP: 11 (ref 5–15)
AST: 40 U/L (ref 15–41)
Alkaline Phosphatase: 72 U/L (ref 38–126)
BUN: 8 mg/dL (ref 6–20)
CHLORIDE: 97 mmol/L — AB (ref 101–111)
CO2: 27 mmol/L (ref 22–32)
Calcium: 8.7 mg/dL — ABNORMAL LOW (ref 8.9–10.3)
Creatinine, Ser: 0.46 mg/dL — ABNORMAL LOW (ref 0.61–1.24)
GFR calc Af Amer: >60 mL/min (ref >60)
GFR calc non Af Amer: >60 mL/min (ref >60)
GLUCOSE: 72 mg/dL (ref 65–99)
POTASSIUM: 3.9 mmol/L (ref 3.5–5.1)
Sodium: 135 mmol/L (ref 135–145)
Total Bilirubin: 0.4 mg/dL (ref 0.3–1.2)
Total Protein: 7.1 g/dL (ref 6.5–8.1)

## 2018-01-11 LAB — LIPASE, BLOOD: Lipase: 26 U/L (ref 11–51)

## 2018-01-11 MED ORDER — ONDANSETRON 8 MG PO TBDP: 8.0000 mg | ORAL_TABLET | Freq: Three times a day (TID) | ORAL | 0 refills | Status: DC | PRN

## 2018-01-11 MED ORDER — OXYCODONE HCL 5 MG PO TABS
15.0000 mg | ORAL_TABLET | Freq: Once | ORAL | Status: AC
Start: 2018-01-11 — End: 2018-01-11
  Administered 2018-01-11: 15 mg via ORAL
  Filled 2018-01-11: qty 3

## 2018-01-11 MED ORDER — ONDANSETRON HCL 4 MG/2ML IJ SOLN
4.0000 mg | Freq: Once | INTRAMUSCULAR | Status: AC
  Administered 2018-01-11: 4 mg via INTRAVENOUS
  Filled 2018-01-11: qty 2

## 2018-01-11 MED ORDER — HYDROMORPHONE HCL 1 MG/ML IJ SOLN
1.0000 mg | Freq: Once | INTRAMUSCULAR | Status: AC
  Administered 2018-01-11: 1 mg via INTRAVENOUS
  Filled 2018-01-11: qty 1

## 2018-01-11 NOTE — ED Provider Notes (Addendum)
[facility] DEPT Provider Note: Georgena Spurling, MD, FACEP  CSN: [id number] MRN: [id number] ARRIVAL: 01/10/18 at 2203 ROOM: WA09/WA09   CHIEF COMPLAINT  Pancreatitis   HISTORY OF PRESENT ILLNESS  01/11/18 5:23 AM Russell Williamson is a 62 y.o. male with history of chronic pancreatitis and throat cancer.  He is supposed to be on oxycodone 15 mg, 6 times daily.  His last prescription for 180 tablets was filled on December 23, 2017 which was a 30-day supply.  He admits that he has been abusing his oxycodone and that he is now out.  He is here complaining of a week and a half history of epigastric pain radiating to his chest.  He rates the pain is severe and characterizes it as like previous pancreatitis pain.  He has been nauseated but not vomiting.  He is also having pain in his throat where he has had radiation therapy for his throat cancer.    Past Medical History:  Diagnosis Date  . Anemia   . Arthritis   . Cervical adenopathy 04/26/2012  . Chronic back pain    lower  . Chronic pancreatitis ([place])   . Constipation   . COPD (chronic obstructive pulmonary disease) ([place])   . Daily headache 05/23/2012   "small ones"  . Deficiency anemia 04/15/2016  . Dyspnea    with exertion  . G tube feedings ([facility])    hx of  feeding tube in stomach  . GERD (gastroesophageal reflux disease)   . History of blood transfusion ~ 2004   "from the pancreatitis"  . History of radiation therapy 02/02/2011-03/22/2011   head/neck,left tonsil  . Hx of radiation therapy 02/02/11 to 03/22/11   L tonsil  . Hypertension   . Insomnia   . 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 Pancytopenia ([place]) 02/28/2014  . Poor venous access 04/28/2016  . Seizures ([place])    alcohol-related last seizure 3 yrs ago  . Thrush 11/02/2013  . 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 Urinary hesitancy     Past Surgical History:  Procedure Laterality Date  . BILE DUCT STENT PLACEMENT     hx of  .  CHOLECYSTECTOMY  04/2005  . DIRECT LARYNGOSCOPY  05/23/2012   Procedure: DIRECT LARYNGOSCOPY;  Surgeon: Rozetta Nunnery, MD;  Location: [facility];  Service: ENT;  Laterality: N/A;  . DIRECT LARYNGOSCOPY N/A 03/23/2013   Procedure: DIRECT LARYNGOSCOPY;  Surgeon: Rozetta Nunnery, MD;  Location: [place];  Service: ENT;  Laterality: N/A;  . ESOPHAGEAL DILATION N/A 03/23/2013   Procedure: ESOPHAGEAL DILATION;  Surgeon: Rozetta Nunnery, MD;  Location: [facility];  Service: ENT;  Laterality: N/A;  . ESOPHAGOGASTRODUODENOSCOPY (EGD) WITH PROPOFOL N/A 03/01/2017   Procedure: ESOPHAGOGASTRODUODENOSCOPY (EGD) WITH PROPOFOL;  Surgeon: Ladene Artist, MD;  Location[facility];  Service: Endoscopy;  Laterality: N/A;  . FLEXIBLE SIGMOIDOSCOPY N/A 03/01/2017   Procedure: FLEXIBLE SIGMOIDOSCOPY;  Surgeon: Ladene Artist, MD;  Location[facility];  Service: Endoscopy;  Laterality: N/A;  . IR GENERIC HISTORICAL  04/29/2016   IR US GUIDE VASC ACCESS RIGHT 04/29/2016 [facility]  . IR GENERIC HISTORICAL  04/29/2016   IR FLUORO GUIDE CV LINE RIGHT 04/29/2016 [facility]  . IR GENERIC HISTORICAL  05/12/2016   IR US GUIDE VASC ACCESS LEFT 05/12/2016 [facility]  . IR GENERIC HISTORICAL  05/12/2016   IR FLUORO GUIDE CV LINE LEFT 05/12/2016 [facility]  . IR  GENERIC HISTORICAL  06/28/2016   IR US GUIDE VASC ACCESS RIGHT 06/28/2016 Sandi Mariscal, MD [facility]  . IR GENERIC HISTORICAL  06/28/2016   IR FLUORO GUIDE PORT INSERTION RIGHT 06/28/2016 Sandi Mariscal, MD [facility]  . IR REMOVAL TUN ACCESS W/ PORT W/O FL MOD SED  02/10/2017  . MASS BIOPSY  05/23/2012   Procedure: NECK MASS BIOPSY;  Surgeon: Rozetta Nunnery, MD;  Location: [facility];  Service: ENT;  Laterality: N/A;  . RADICAL NECK DISSECTION  05/23/2012   w/mass excision  . RADICAL NECK DISSECTION  05/23/2012   Procedure: RADICAL NECK DISSECTION;  Surgeon: Rozetta Nunnery, MD;  Location: [facility];  Service: ENT;   Laterality: Left;  . TIBIA FRACTURE SURGERY  1990's   left    Family History  Problem Relation Age of Onset  . COPD Mother   . Colon cancer Neg Hx     Social History   Tobacco Use  . Smoking status: Current Every Day Smoker    Packs/day: 0.25    Years: 40.00    Pack years: 10.00    Types: Cigarettes  . Smokeless tobacco: Never Used  . Tobacco comment: hx 1/2 -1 PPD  Substance Use Topics  . Alcohol use: No    Comment: 05/23/2012 hx alcohol abuse, quit ~ 2007  . Drug use: No    Types: Marijuana    Comment: 05/23/2012 "used marijuana in my teens"    Prior to Admission medications   Medication Sig Start Date End Date Taking? Authorizing Provider  amLODipine (NORVASC) 2.5 MG tablet Take 2.5 mg by mouth daily. 12/24/16  Yes [provider]  folic acid (FOLVITE) 1 MG tablet Take 1 mg by mouth daily.  05/14/15  Yes [provider]  lipase/protease/amylase (CREON-12/PANCREASE) 12000 UNITS CPEP capsule Take 1 capsule by mouth 4 (four) times daily.   Yes [provider]  mirtazapine (REMERON) 30 MG tablet Take 30 mg by mouth at bedtime.   Yes [provider]  Omega-3 Fatty Acids (OMEGA-3 FISH OIL) 300 MG CAPS Take 300 mg by mouth daily.   Yes [provider]  oxyCODONE (ROXICODONE) 15 MG immediate release tablet Take 15 mg by mouth every 4 (four) hours as needed for pain.  12/23/17  Yes [provider]  pantoprazole (PROTONIX) 20 MG tablet Take 1 tablet (20 mg total) by mouth 2 (two) times daily. 10/07/16  Yes Gareth Morgan, MD  PROAIR HFA 108 [phone] Base) MCG/ACT inhaler Inhale 2 puff into lungs every 4-6 hours as needed for shortness of breath/wheezing 02/02/16  Yes [provider]  promethazine (PHENERGAN) 25 MG tablet Take 1 tablet (25 mg total) by mouth every 6 (six) hours as needed for nausea or vomiting. 10/07/16  Yes Gareth Morgan, MD    Allergies Patient has no known allergies.   REVIEW OF SYSTEMS  Negative except as  noted here or in the History of Present Illness.   PHYSICAL EXAMINATION  Initial Vital Signs Blood pressure 135/86, pulse 60, temperature 98.1 F (36.7 C), temperature source Oral, resp. rate 18, SpO2 99 %.  Examination General: Well-developed, cachectic male in no acute distress; appearance consistent with age of record HENT: normocephalic; atraumatic; pharyngeal erythema Eyes: pupils equal, round and reactive to light; extraocular muscles intact; arcus senilis bilaterally Neck: supple Heart: regular rate and rhythm Lungs: clear to auscultation bilaterally Abdomen: soft; nondistended; epigastric and periumbilical tenderness; no masses or hepatosplenomegaly; bowel sounds present Extremities: No deformity; full range of motion; pulses  normal Neurologic: Awake, alert and oriented; motor function intact in all extremities and symmetric; no facial droop Skin: Warm and dry Psychiatric: Flat affect   RESULTS  Summary of this visit's results, reviewed by myself:   EKG Interpretation  Date/Time:    Ventricular Rate:    PR Interval:    QRS Duration:   QT Interval:    QTC Calculation:   R Axis:     Text Interpretation:        Laboratory Studies: Results for orders placed or performed during the hospital encounter of 01/11/18 (from the past 24 hour(s))  Urinalysis, Routine w reflex microscopic     Status: None   Collection Time: 01/11/18  2:34 AM  Result Value Ref Range   Color, Urine YELLOW YELLOW   APPearance CLEAR CLEAR   Specific Gravity, Urine 1.011 1.005 - 1.030   pH 8.0 5.0 - 8.0   Glucose, UA NEGATIVE NEGATIVE mg/dL   Hgb urine dipstick NEGATIVE NEGATIVE   Bilirubin Urine NEGATIVE NEGATIVE   Ketones, ur NEGATIVE NEGATIVE mg/dL   Protein, ur NEGATIVE NEGATIVE mg/dL   Nitrite NEGATIVE NEGATIVE   Leukocytes, UA NEGATIVE NEGATIVE  Lipase, blood     Status: None   Collection Time: 01/11/18  3:53 AM  Result Value Ref Range   Lipase 26 11 - 51 U/L  Comprehensive  metabolic panel     Status: Abnormal   Collection Time: 01/11/18  3:53 AM  Result Value Ref Range   Sodium 135 135 - 145 mmol/L   Potassium 3.9 3.5 - 5.1 mmol/L   Chloride 97 (L) 101 - 111 mmol/L   CO2 27 22 - 32 mmol/L   Glucose, Bld 72 65 - 99 mg/dL   BUN 8 6 - 20 mg/dL   Creatinine, Ser 0.46 (L) 0.61 - 1.24 mg/dL   Calcium 8.7 (L) 8.9 - 10.3 mg/dL   Total Protein 7.1 6.5 - 8.1 g/dL   Albumin 2.9 (L) 3.5 - 5.0 g/dL   AST 40 15 - 41 U/L   ALT 25 17 - 63 U/L   Alkaline Phosphatase 72 38 - 126 U/L   Total Bilirubin 0.4 0.3 - 1.2 mg/dL   GFR calc non Af Amer >60 >60 mL/min   GFR calc Af Amer >60 >60 mL/min   Anion gap 11 5 - 15  CBC     Status: Abnormal   Collection Time: 01/11/18  3:53 AM  Result Value Ref Range   WBC 3.1 (L) 4.0 - 10.5 K/uL   RBC 3.98 (L) 4.22 - 5.81 MIL/uL   Hemoglobin 12.6 (L) 13.0 - 17.0 g/dL   HCT 39.2 39.0 - 52.0 %   MCV 98.5 78.0 - 100.0 fL   MCH 31.7 26.0 - 34.0 pg   MCHC 32.1 30.0 - 36.0 g/dL   RDW 16.3 (H) 11.5 - 15.5 %   Platelets 228 150 - 400 K/uL   Imaging Studies: No results found.  ED COURSE  Nursing notes and initial vitals signs, including pulse oximetry, reviewed.  Vitals:   01/10/18 2249 01/11/18 0154  BP: 120/79 135/86  Pulse: 71 60  Resp: 16 18  Temp: 98.5 F (36.9 C) 98.1 F (36.7 C)  TempSrc: Oral Oral  SpO2: 97% 99%   5:36 AM The patient's lab work is reassuring.  He acknowledges he has been over taking his oxycodone and that we are not able to write him any prescriptions for additional narcotics as this would be in violation of his agreement.  We will treat his pain and nausea here and have him contact his primary care physician later today.  I see no indication for admission at this time.  He is in no acute distress and does not appear dehydrated.  PROCEDURES    ED DIAGNOSES     ICD-10-CM   1. Chronic abdominal pain R10.9    G89.29   2. Cancer related pain G89.3   3. Opiate misuse F11.90        Russell Williamson, Russell Reichmann,  MD 01/11/18 0537    Shanon Rosser, MD 01/11/18 [phone]

## 2018-01-26 ENCOUNTER — Ambulatory Visit: Payer: Self-pay | Admitting: Hematology and Oncology

## 2018-01-26 ENCOUNTER — Other Ambulatory Visit: Payer: Self-pay

## 2018-02-02 ENCOUNTER — Encounter: Payer: Self-pay | Admitting: Hematology and Oncology

## 2018-02-02 ENCOUNTER — Inpatient Hospital Stay: Payer: Medicaid Other | Admitting: Hematology and Oncology

## 2018-02-02 ENCOUNTER — Other Ambulatory Visit: Payer: Self-pay | Admitting: Hematology and Oncology

## 2018-02-02 ENCOUNTER — Inpatient Hospital Stay: Payer: Medicaid Other | Attending: Hematology and Oncology

## 2018-02-02 ENCOUNTER — Telehealth: Payer: Self-pay

## 2018-02-02 DIAGNOSIS — D72819 Decreased white blood cell count, unspecified: Secondary | ICD-10-CM

## 2018-02-02 DIAGNOSIS — E039 Hypothyroidism, unspecified: Secondary | ICD-10-CM

## 2018-02-02 NOTE — Telephone Encounter (Signed)
Mailed letter per Dr Alvy Bimler to patient regarding no show for his appt today.

## 2018-02-06 ENCOUNTER — Emergency Department (HOSPITAL_COMMUNITY): Payer: Medicaid Other

## 2018-02-06 ENCOUNTER — Inpatient Hospital Stay (HOSPITAL_COMMUNITY)
Admission: EM | Admit: 2018-02-06 | Discharge: 2018-02-18 | DRG: 871 | Disposition: A | Discharge status: Home Health | Payer: Medicaid Other | Attending: Internal Medicine | Admitting: Internal Medicine

## 2018-02-06 ENCOUNTER — Other Ambulatory Visit: Payer: Self-pay

## 2018-02-06 ENCOUNTER — Encounter (HOSPITAL_COMMUNITY): Payer: Self-pay | Admitting: Emergency Medicine

## 2018-02-06 DIAGNOSIS — E872 Acidosis: Secondary | ICD-10-CM | POA: Diagnosis present

## 2018-02-06 DIAGNOSIS — R0603 Acute respiratory distress: Secondary | ICD-10-CM

## 2018-02-06 DIAGNOSIS — K219 Gastro-esophageal reflux disease without esophagitis: Secondary | ICD-10-CM | POA: Diagnosis present

## 2018-02-06 DIAGNOSIS — G9341 Metabolic encephalopathy: Secondary | ICD-10-CM | POA: Diagnosis not present

## 2018-02-06 DIAGNOSIS — J9690 Respiratory failure, unspecified, unspecified whether with hypoxia or hypercapnia: Secondary | ICD-10-CM

## 2018-02-06 DIAGNOSIS — R296 Repeated falls: Secondary | ICD-10-CM | POA: Diagnosis present

## 2018-02-06 DIAGNOSIS — Z720 Tobacco use: Secondary | ICD-10-CM | POA: Diagnosis present

## 2018-02-06 DIAGNOSIS — E039 Hypothyroidism, unspecified: Secondary | ICD-10-CM

## 2018-02-06 DIAGNOSIS — Z825 Family history of asthma and other chronic lower respiratory diseases: Secondary | ICD-10-CM

## 2018-02-06 DIAGNOSIS — J9602 Acute respiratory failure with hypercapnia: Secondary | ICD-10-CM | POA: Diagnosis not present

## 2018-02-06 DIAGNOSIS — E871 Hypo-osmolality and hyponatremia: Secondary | ICD-10-CM | POA: Diagnosis not present

## 2018-02-06 DIAGNOSIS — F1010 Alcohol abuse, uncomplicated: Secondary | ICD-10-CM | POA: Diagnosis present

## 2018-02-06 DIAGNOSIS — Z22322 Carrier or suspected carrier of Methicillin resistant Staphylococcus aureus: Secondary | ICD-10-CM

## 2018-02-06 DIAGNOSIS — Z23 Encounter for immunization: Secondary | ICD-10-CM | POA: Diagnosis not present

## 2018-02-06 DIAGNOSIS — E43 Unspecified severe protein-calorie malnutrition: Secondary | ICD-10-CM | POA: Diagnosis present

## 2018-02-06 DIAGNOSIS — E8770 Fluid overload, unspecified: Secondary | ICD-10-CM | POA: Diagnosis not present

## 2018-02-06 DIAGNOSIS — M545 Low back pain: Secondary | ICD-10-CM | POA: Diagnosis present

## 2018-02-06 DIAGNOSIS — W19XXXA Unspecified fall, initial encounter: Secondary | ICD-10-CM | POA: Diagnosis present

## 2018-02-06 DIAGNOSIS — G8929 Other chronic pain: Secondary | ICD-10-CM | POA: Diagnosis present

## 2018-02-06 DIAGNOSIS — E860 Dehydration: Secondary | ICD-10-CM | POA: Diagnosis present

## 2018-02-06 DIAGNOSIS — F1410 Cocaine abuse, uncomplicated: Secondary | ICD-10-CM | POA: Diagnosis not present

## 2018-02-06 DIAGNOSIS — Z79891 Long term (current) use of opiate analgesic: Secondary | ICD-10-CM

## 2018-02-06 DIAGNOSIS — Z681 Body mass index (BMI) 19 or less, adult: Secondary | ICD-10-CM | POA: Diagnosis not present

## 2018-02-06 DIAGNOSIS — Z960 Presence of urogenital implants: Secondary | ICD-10-CM | POA: Diagnosis present

## 2018-02-06 DIAGNOSIS — Z590 Homelessness: Secondary | ICD-10-CM

## 2018-02-06 DIAGNOSIS — G9340 Encephalopathy, unspecified: Secondary | ICD-10-CM | POA: Diagnosis not present

## 2018-02-06 DIAGNOSIS — Y92009 Unspecified place in unspecified non-institutional (private) residence as the place of occurrence of the external cause: Secondary | ICD-10-CM

## 2018-02-06 DIAGNOSIS — K861 Other chronic pancreatitis: Secondary | ICD-10-CM | POA: Diagnosis present

## 2018-02-06 DIAGNOSIS — Z85818 Personal history of malignant neoplasm of other sites of lip, oral cavity, and pharynx: Secondary | ICD-10-CM

## 2018-02-06 DIAGNOSIS — G4700 Insomnia, unspecified: Secondary | ICD-10-CM | POA: Diagnosis present

## 2018-02-06 DIAGNOSIS — F17210 Nicotine dependence, cigarettes, uncomplicated: Secondary | ICD-10-CM | POA: Diagnosis present

## 2018-02-06 DIAGNOSIS — R55 Syncope and collapse: Secondary | ICD-10-CM | POA: Diagnosis present

## 2018-02-06 DIAGNOSIS — R1313 Dysphagia, pharyngeal phase: Secondary | ICD-10-CM | POA: Diagnosis present

## 2018-02-06 DIAGNOSIS — R40243 Glasgow coma scale score 3-8, unspecified time: Secondary | ICD-10-CM | POA: Diagnosis not present

## 2018-02-06 DIAGNOSIS — Y842 Radiological procedure and radiotherapy as the cause of abnormal reaction of the patient, or of later complication, without mention of misadventure at the time of the procedure: Secondary | ICD-10-CM | POA: Diagnosis present

## 2018-02-06 DIAGNOSIS — A419 Sepsis, unspecified organism: Principal | ICD-10-CM | POA: Diagnosis present

## 2018-02-06 DIAGNOSIS — R64 Cachexia: Secondary | ICD-10-CM | POA: Diagnosis present

## 2018-02-06 DIAGNOSIS — R1310 Dysphagia, unspecified: Secondary | ICD-10-CM

## 2018-02-06 DIAGNOSIS — Z79899 Other long term (current) drug therapy: Secondary | ICD-10-CM

## 2018-02-06 DIAGNOSIS — J9600 Acute respiratory failure, unspecified whether with hypoxia or hypercapnia: Secondary | ICD-10-CM | POA: Diagnosis not present

## 2018-02-06 DIAGNOSIS — K5900 Constipation, unspecified: Secondary | ICD-10-CM | POA: Diagnosis present

## 2018-02-06 DIAGNOSIS — J440 Chronic obstructive pulmonary disease with acute lower respiratory infection: Secondary | ICD-10-CM | POA: Diagnosis present

## 2018-02-06 DIAGNOSIS — R079 Chest pain, unspecified: Secondary | ICD-10-CM

## 2018-02-06 DIAGNOSIS — J690 Pneumonitis due to inhalation of food and vomit: Secondary | ICD-10-CM | POA: Diagnosis not present

## 2018-02-06 DIAGNOSIS — I10 Essential (primary) hypertension: Secondary | ICD-10-CM | POA: Diagnosis present

## 2018-02-06 DIAGNOSIS — L8990 Pressure ulcer of unspecified site, unspecified stage: Secondary | ICD-10-CM

## 2018-02-06 DIAGNOSIS — E876 Hypokalemia: Secondary | ICD-10-CM | POA: Diagnosis not present

## 2018-02-06 DIAGNOSIS — E440 Moderate protein-calorie malnutrition: Secondary | ICD-10-CM

## 2018-02-06 DIAGNOSIS — Z931 Gastrostomy status: Secondary | ICD-10-CM

## 2018-02-06 DIAGNOSIS — J9601 Acute respiratory failure with hypoxia: Secondary | ICD-10-CM | POA: Diagnosis not present

## 2018-02-06 DIAGNOSIS — J15212 Pneumonia due to Methicillin resistant Staphylococcus aureus: Secondary | ICD-10-CM | POA: Diagnosis present

## 2018-02-06 DIAGNOSIS — D638 Anemia in other chronic diseases classified elsewhere: Secondary | ICD-10-CM | POA: Diagnosis not present

## 2018-02-06 DIAGNOSIS — C099 Malignant neoplasm of tonsil, unspecified: Secondary | ICD-10-CM | POA: Diagnosis present

## 2018-02-06 DIAGNOSIS — J449 Chronic obstructive pulmonary disease, unspecified: Secondary | ICD-10-CM | POA: Diagnosis present

## 2018-02-06 DIAGNOSIS — Z9049 Acquired absence of other specified parts of digestive tract: Secondary | ICD-10-CM

## 2018-02-06 DIAGNOSIS — E875 Hyperkalemia: Secondary | ICD-10-CM | POA: Diagnosis present

## 2018-02-06 DIAGNOSIS — Z01818 Encounter for other preprocedural examination: Secondary | ICD-10-CM

## 2018-02-06 DIAGNOSIS — J181 Lobar pneumonia, unspecified organism: Secondary | ICD-10-CM | POA: Diagnosis not present

## 2018-02-06 DIAGNOSIS — R570 Cardiogenic shock: Secondary | ICD-10-CM | POA: Diagnosis not present

## 2018-02-06 DIAGNOSIS — R748 Abnormal levels of other serum enzymes: Secondary | ICD-10-CM | POA: Diagnosis present

## 2018-02-06 DIAGNOSIS — J189 Pneumonia, unspecified organism: Secondary | ICD-10-CM | POA: Diagnosis present

## 2018-02-06 DIAGNOSIS — E162 Hypoglycemia, unspecified: Secondary | ICD-10-CM | POA: Diagnosis not present

## 2018-02-06 DIAGNOSIS — R739 Hyperglycemia, unspecified: Secondary | ICD-10-CM | POA: Diagnosis not present

## 2018-02-06 DIAGNOSIS — Z923 Personal history of irradiation: Secondary | ICD-10-CM

## 2018-02-06 LAB — URINALYSIS, ROUTINE W REFLEX MICROSCOPIC
Bacteria, UA: NONE SEEN
Bilirubin Urine: NEGATIVE
GLUCOSE, UA: NEGATIVE mg/dL
KETONES UR: NEGATIVE mg/dL
LEUKOCYTES UA: NEGATIVE
Nitrite: NEGATIVE
PH: 5 (ref 5.0–8.0)
Protein, ur: 30 mg/dL — AB
SPECIFIC GRAVITY, URINE: 1.018 (ref 1.005–1.030)

## 2018-02-06 LAB — CBC WITH DIFFERENTIAL/PLATELET
Basophils Absolute: 0 10*3/uL (ref 0.0–0.1)
Basophils Relative: 0 %
EOS ABS: 0 10*3/uL (ref 0.0–0.7)
EOS PCT: 0 %
HCT: 35.2 % — ABNORMAL LOW (ref 39.0–52.0)
Hemoglobin: 12.1 g/dL — ABNORMAL LOW (ref 13.0–17.0)
LYMPHS ABS: 0.5 10*3/uL — AB (ref 0.7–4.0)
LYMPHS PCT: 4 %
MCH: 33.1 pg (ref 26.0–34.0)
MCHC: 34.4 g/dL (ref 30.0–36.0)
MCV: 96.2 fL (ref 78.0–100.0)
MONO ABS: 1.3 10*3/uL — AB (ref 0.1–1.0)
MONOS PCT: 11 %
Neutro Abs: 10.8 10*3/uL — ABNORMAL HIGH (ref 1.7–7.7)
Neutrophils Relative %: 85 %
PLATELETS: 165 10*3/uL (ref 150–400)
RBC: 3.66 MIL/uL — AB (ref 4.22–5.81)
RDW: 16.8 % — AB (ref 11.5–15.5)
WBC: 12.6 10*3/uL — ABNORMAL HIGH (ref 4.0–10.5)

## 2018-02-06 LAB — BASIC METABOLIC PANEL
Anion gap: 12 (ref 5–15)
BUN: 32 mg/dL — AB (ref 6–20)
CO2: 31 mmol/L (ref 22–32)
Calcium: 8.9 mg/dL (ref 8.9–10.3)
Chloride: 88 mmol/L — ABNORMAL LOW (ref 101–111)
Creatinine, Ser: 0.8 mg/dL (ref 0.61–1.24)
GFR calc Af Amer: >60 mL/min (ref >60)
Glucose, Bld: 86 mg/dL (ref 65–99)
POTASSIUM: 5.3 mmol/L — AB (ref 3.5–5.1)
Sodium: 131 mmol/L — ABNORMAL LOW (ref 135–145)

## 2018-02-06 LAB — HEPATIC FUNCTION PANEL
ALBUMIN: 2.8 g/dL — AB (ref 3.5–5.0)
ALK PHOS: 82 U/L (ref 38–126)
ALT: 149 U/L — AB (ref 17–63)
AST: 367 U/L — ABNORMAL HIGH (ref 15–41)
Bilirubin, Direct: 0.3 mg/dL (ref 0.1–0.5)
Indirect Bilirubin: 0.8 mg/dL (ref 0.3–0.9)
Total Bilirubin: 1.1 mg/dL (ref 0.3–1.2)
Total Protein: 6.4 g/dL — ABNORMAL LOW (ref 6.5–8.1)

## 2018-02-06 LAB — RAPID URINE DRUG SCREEN, HOSP PERFORMED
Amphetamines: NOT DETECTED
Barbiturates: NOT DETECTED
Benzodiazepines: POSITIVE — AB
Cocaine: POSITIVE — AB
Opiates: POSITIVE — AB
TETRAHYDROCANNABINOL: NOT DETECTED

## 2018-02-06 LAB — OSMOLALITY, URINE: Osmolality, Ur: 502 mOsm/kg (ref 300–900)

## 2018-02-06 LAB — LACTIC ACID, PLASMA: Lactic Acid, Venous: 2.6 mmol/L (ref 0.5–1.9)

## 2018-02-06 LAB — TROPONIN I: TROPONIN I: 0.08 ng/mL — AB (ref <0.03)

## 2018-02-06 LAB — MAGNESIUM: Magnesium: 1.4 mg/dL — ABNORMAL LOW (ref 1.7–2.4)

## 2018-02-06 LAB — CK: Total CK: 3190 U/L — ABNORMAL HIGH (ref 49–397)

## 2018-02-06 LAB — ETHANOL: Alcohol, Ethyl (B): <10 mg/dL (ref <10)

## 2018-02-06 LAB — PHOSPHORUS: Phosphorus: 3.1 mg/dL (ref 2.5–4.6)

## 2018-02-06 MED ORDER — ONDANSETRON HCL 4 MG/2ML IJ SOLN
4.0000 mg | Freq: Four times a day (QID) | INTRAMUSCULAR | Status: DC | PRN
  Administered 2018-02-13 – 2018-02-15 (×2): 4 mg via INTRAVENOUS
  Filled 2018-02-06 (×2): qty 2

## 2018-02-06 MED ORDER — CEFTRIAXONE SODIUM 1 G IJ SOLR
1.0000 g | INTRAMUSCULAR | Status: DC
  Administered 2018-02-07 – 2018-02-09 (×3): 1 g via INTRAVENOUS
  Filled 2018-02-06 (×4): qty 10

## 2018-02-06 MED ORDER — THIAMINE HCL 100 MG/ML IJ SOLN: 100.0000 mg | Freq: Every day | INTRAMUSCULAR | Status: DC

## 2018-02-06 MED ORDER — PNEUMOCOCCAL VAC POLYVALENT 25 MCG/0.5ML IJ INJ
0.5000 mL | INJECTION | INTRAMUSCULAR | Status: AC
  Administered 2018-02-07: 0.5 mL via INTRAMUSCULAR
  Filled 2018-02-06: qty 0.5

## 2018-02-06 MED ORDER — LORAZEPAM 2 MG/ML IJ SOLN
1.0000 mg | Freq: Four times a day (QID) | INTRAMUSCULAR | Status: AC | PRN
Start: 2018-02-06 — End: 2018-02-09

## 2018-02-06 MED ORDER — ENOXAPARIN SODIUM 30 MG/0.3ML ~~LOC~~ SOLN
30.0000 mg | SUBCUTANEOUS | Status: DC
  Administered 2018-02-07 – 2018-02-12 (×6): 30 mg via SUBCUTANEOUS
  Filled 2018-02-06 (×6): qty 0.3

## 2018-02-06 MED ORDER — IPRATROPIUM-ALBUTEROL 0.5-2.5 (3) MG/3ML IN SOLN
3.0000 mL | Freq: Four times a day (QID) | RESPIRATORY_TRACT | Status: DC
  Administered 2018-02-07: 3 mL via RESPIRATORY_TRACT
  Filled 2018-02-06: qty 3

## 2018-02-06 MED ORDER — POLYETHYLENE GLYCOL 3350 17 G PO PACK
17.0000 g | PACK | Freq: Every day | ORAL | Status: DC | PRN
  Administered 2018-02-12: 17 g via ORAL
  Filled 2018-02-06 (×2): qty 1

## 2018-02-06 MED ORDER — GUAIFENESIN ER 600 MG PO TB12
600.0000 mg | ORAL_TABLET | Freq: Two times a day (BID) | ORAL | Status: DC
  Administered 2018-02-06 – 2018-02-11 (×11): 600 mg via ORAL
  Filled 2018-02-06 (×11): qty 1

## 2018-02-06 MED ORDER — MIRTAZAPINE 15 MG PO TABS
30.0000 mg | ORAL_TABLET | Freq: Every day | ORAL | Status: DC
  Administered 2018-02-06 – 2018-02-10 (×5): 30 mg via ORAL
  Filled 2018-02-06 (×5): qty 2

## 2018-02-06 MED ORDER — ONDANSETRON HCL 4 MG PO TABS
4.0000 mg | ORAL_TABLET | Freq: Four times a day (QID) | ORAL | Status: DC | PRN
  Administered 2018-02-10 – 2018-02-15 (×2): 4 mg via ORAL
  Filled 2018-02-06 (×2): qty 1

## 2018-02-06 MED ORDER — PANCRELIPASE (LIP-PROT-AMYL) 12000-38000 UNITS PO CPEP
12000.0000 [IU] | ORAL_CAPSULE | Freq: Three times a day (TID) | ORAL | Status: DC
  Administered 2018-02-07 – 2018-02-11 (×17): 12000 [IU] via ORAL
  Filled 2018-02-06 (×19): qty 1

## 2018-02-06 MED ORDER — SODIUM CHLORIDE 0.9 % IV BOLUS
1000.0000 mL | Freq: Once | INTRAVENOUS | Status: AC
  Administered 2018-02-06: 1000 mL via INTRAVENOUS

## 2018-02-06 MED ORDER — FOLIC ACID 1 MG PO TABS
1.0000 mg | ORAL_TABLET | Freq: Every day | ORAL | Status: DC
  Administered 2018-02-07 – 2018-02-10 (×4): 1 mg via ORAL
  Filled 2018-02-06 (×4): qty 1

## 2018-02-06 MED ORDER — LEVALBUTEROL HCL 0.63 MG/3ML IN NEBU: 0.6300 mg | INHALATION_SOLUTION | Freq: Four times a day (QID) | RESPIRATORY_TRACT | Status: DC | PRN

## 2018-02-06 MED ORDER — SODIUM CHLORIDE 0.9 % IV SOLN
INTRAVENOUS | Status: AC
  Administered 2018-02-06: 23:00:00 via INTRAVENOUS

## 2018-02-06 MED ORDER — ACETAMINOPHEN 650 MG RE SUPP: 650.0000 mg | Freq: Four times a day (QID) | RECTAL | Status: DC | PRN

## 2018-02-06 MED ORDER — ACETAMINOPHEN 325 MG PO TABS
650.0000 mg | ORAL_TABLET | Freq: Four times a day (QID) | ORAL | Status: DC | PRN
  Administered 2018-02-09 – 2018-02-11 (×2): 650 mg via ORAL
  Filled 2018-02-06 (×3): qty 2

## 2018-02-06 MED ORDER — FOLIC ACID 1 MG PO TABS: 1.0000 mg | ORAL_TABLET | Freq: Every day | ORAL | Status: DC

## 2018-02-06 MED ORDER — SODIUM CHLORIDE 0.9 % IV SOLN
1.0000 g | Freq: Once | INTRAVENOUS | Status: AC
  Administered 2018-02-06: 1 g via INTRAVENOUS
  Filled 2018-02-06: qty 10

## 2018-02-06 MED ORDER — ENSURE ENLIVE PO LIQD
237.0000 mL | Freq: Two times a day (BID) | ORAL | Status: DC
  Administered 2018-02-07: 237 mL via ORAL

## 2018-02-06 MED ORDER — AZITHROMYCIN 500 MG IV SOLR
500.0000 mg | Freq: Once | INTRAVENOUS | Status: AC
  Administered 2018-02-06: 500 mg via INTRAVENOUS
  Filled 2018-02-06: qty 500

## 2018-02-06 MED ORDER — VITAMIN B-1 100 MG PO TABS
100.0000 mg | ORAL_TABLET | Freq: Every day | ORAL | Status: DC
  Administered 2018-02-07 – 2018-02-10 (×4): 100 mg via ORAL
  Filled 2018-02-06 (×4): qty 1

## 2018-02-06 MED ORDER — OXYCODONE HCL 5 MG PO TABS
15.0000 mg | ORAL_TABLET | ORAL | Status: DC | PRN
  Administered 2018-02-06 – 2018-02-10 (×14): 15 mg via ORAL
  Filled 2018-02-06 (×14): qty 3

## 2018-02-06 MED ORDER — ADULT MULTIVITAMIN W/MINERALS CH
1.0000 | ORAL_TABLET | Freq: Every day | ORAL | Status: DC
  Administered 2018-02-07 – 2018-02-10 (×4): 1 via ORAL
  Filled 2018-02-06 (×4): qty 1

## 2018-02-06 MED ORDER — PANTOPRAZOLE SODIUM 20 MG PO TBEC
20.0000 mg | DELAYED_RELEASE_TABLET | Freq: Two times a day (BID) | ORAL | Status: DC
  Administered 2018-02-06 – 2018-02-10 (×9): 20 mg via ORAL
  Filled 2018-02-06 (×9): qty 1

## 2018-02-06 MED ORDER — SODIUM CHLORIDE 0.9 % IV SOLN: 500.0000 mg | INTRAVENOUS | Status: DC

## 2018-02-06 MED ORDER — LORAZEPAM 1 MG PO TABS: 1.0000 mg | ORAL_TABLET | Freq: Four times a day (QID) | ORAL | Status: AC | PRN

## 2018-02-06 NOTE — ED Provider Notes (Signed)
[facility] EMERGENCY DEPARTMENT Provider Note   CSN: [id number] Arrival date & time: 02/06/18  1511     History   Chief Complaint Chief Complaint  Patient presents with  . Fall    HPI Russell Williamson is a 62 y.o. male.  The history is provided by the patient.  Fall  This is a new problem. The current episode started less than 1 hour ago. The problem occurs rarely. The problem has not changed since onset.Pertinent negatives include no chest pain, no abdominal pain, no headaches and no shortness of breath. Nothing aggravates the symptoms. Nothing relieves the symptoms. He has tried nothing for the symptoms. The treatment provided no relief.  Cough  This is a new problem. Episode onset: unable to specify. The problem occurs constantly. The problem has not changed since onset.The cough is productive of sputum. There has been no fever. Pertinent negatives include no chest pain, no chills, no ear pain, no headaches, no sore throat and no shortness of breath. He has tried nothing for the symptoms. He is a smoker.    Past Medical History:  Diagnosis Date  . Anemia   . Arthritis   . Cervical adenopathy 04/26/2012  . Chronic back pain    lower  . Chronic pancreatitis ([place])   . Constipation   . COPD (chronic obstructive pulmonary disease) ([place])   . Daily headache 05/23/2012   "small ones"  . Deficiency anemia 04/15/2016  . Dyspnea    with exertion  . G tube feedings (H[facility])    hx of  feeding tube in stomach  . GERD (gastroesophageal reflux disease)   . History of blood transfusion ~ 2004   "from the pancreatitis"  . History of radiation therapy 02/02/2011-03/22/2011   head/neck,left tonsil  . Hx of radiation therapy 02/02/11 to 03/22/11   L tonsil  . Hypertension   . Insomnia   . 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 Pancytopenia ([place]) 02/28/2014  . Poor venous access 04/28/2016  . Seizures ([place])    alcohol-related last seizure 3 yrs  ago  . Thrush 11/02/2013  . 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 Urinary hesitancy     Patient Active Problem List   Diagnosis Date Noted  . COPD with chronic bronchitis ([place]) 02/06/2018  . CAP (community acquired pneumonia) 02/06/2018  . Protein-calorie malnutrition, severe 07/03/2017  . Abnormal EKG 07/02/2017  . Abdominal pain, epigastric   . COPD exacerbation ([place]) 01/27/2017  . Oral thrush 01/27/2017  . 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 PICC (peripherally inserted central catheter) flush 05/07/2016  . Port catheter in place 05/05/2016  . Poor venous access 04/28/2016  . Deficiency anemia 04/15/2016  . Chronic leukopenia 04/19/2015  . Anemia in chronic illness 04/19/2015  . Weight loss, unintentional 04/19/2015  . Relapsing chronic pancreatitis ([place]) 01/17/2015  . Diarrhea 09/04/2014  . 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 Malnutrition of moderate degree ([place]) 09/03/2014  . Hypokalemia 09/02/2014  . Chronic alcoholic pancreatitis ([place]) 09/01/2014  . Mucositis 06/17/2014  . Chronic neck pain 06/17/2014  . S/P gastrostomy ([place]) 06/17/2014  . Nicotine abuse 02/28/2014  . Pancytopenia ([place]) 02/28/2014  . Hypothyroidism (acquired) 11/02/2013  . Hypothyroidism 10/29/2013  . 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 Cervical adenopathy 04/26/2012  . Hiatal hernia   . Hx of radiation therapy   . HTN (hypertension) 10/16/2011  . History of cancer tonsil 08/20/2011    Past Surgical History:  Procedure Laterality Date  .  BILE DUCT STENT PLACEMENT     hx of  . CHOLECYSTECTOMY  04/2005  . DIRECT LARYNGOSCOPY  05/23/2012   Procedure: DIRECT LARYNGOSCOPY;  Surgeon: Rozetta Nunnery, MD;  Location: [facility];  Service: ENT;  Laterality: N/A;  . DIRECT LARYNGOSCOPY N/A 03/23/2013   Procedure: DIRECT LARYNGOSCOPY;  Surgeon: Rozetta Nunnery, MD;  Location: [facility];  Service: ENT;  Laterality: N/A;  . ESOPHAGEAL DILATION N/A 03/23/2013   Procedure: ESOPHAGEAL DILATION;  Surgeon:  Rozetta Nunnery, MD;  Location: [facility];  Service: ENT;  Laterality: N/A;  . ESOPHAGOGASTRODUODENOSCOPY (EGD) WITH PROPOFOL N/A 03/01/2017   Procedure: ESOPHAGOGASTRODUODENOSCOPY (EGD) WITH PROPOFOL;  Surgeon: Ladene Artist, MD;  Location: [facility];  Service: Endoscopy;  Laterality: N/A;  . FLEXIBLE SIGMOIDOSCOPY N/A 03/01/2017   Procedure: FLEXIBLE SIGMOIDOSCOPY;  Surgeon: Ladene Artist, MD;  Location: [facility];  Service: Endoscopy;  Laterality: N/A;  . IR GENERIC HISTORICAL  04/29/2016   IR US GUIDE VASC ACCESS RIGHT 04/29/2016 [facility]  . IR GENERIC HISTORICAL  04/29/2016   IR FLUORO GUIDE CV LINE RIGHT 04/29/2016 [facility]  . IR GENERIC HISTORICAL  05/12/2016   IR US GUIDE VASC ACCESS LEFT 05/12/2016 [facility]  . IR GENERIC HISTORICAL  05/12/2016   IR FLUORO GUIDE CV LINE LEFT 05/12/2016 [facility]  . IR GENERIC HISTORICAL  06/28/2016   IR US GUIDE VASC ACCESS RIGHT 06/28/2016 Sandi Mariscal, MD [facility]  . IR GENERIC HISTORICAL  06/28/2016   IR FLUORO GUIDE PORT INSERTION RIGHT 06/28/2016 Sandi Mariscal, MD [facility]  . IR REMOVAL TUN ACCESS W/ PORT W/O FL MOD SED  02/10/2017  . MASS BIOPSY  05/23/2012   Procedure: NECK MASS BIOPSY;  Surgeon: Rozetta Nunnery, MD;  Location: [facility];  Service: ENT;  Laterality: N/A;  . RADICAL NECK DISSECTION  05/23/2012   w/mass excision  . RADICAL NECK DISSECTION  05/23/2012   Procedure: RADICAL NECK DISSECTION;  Surgeon: Rozetta Nunnery, MD;  Location: [facility];  Service: ENT;  Laterality: Left;  . TIBIA FRACTURE SURGERY  1990's   left        Home Medications    Prior to Admission medications   Medication Sig Start Date End Date Taking? Authorizing Provider  amLODipine (NORVASC) 2.5 MG tablet Take 2.5 mg by mouth daily. 12/24/16   [provider]  folic acid (FOLVITE) 1 MG tablet Take 1 mg by mouth daily.  05/14/15   [provider]  lipase/protease/amylase (CREON-12/PANCREASE) 12000  UNITS CPEP capsule Take 1 capsule by mouth 4 (four) times daily.    [provider]  mirtazapine (REMERON) 30 MG tablet Take 30 mg by mouth at bedtime.    [provider]  Omega-3 Fatty Acids (OMEGA-3 FISH OIL) 300 MG CAPS Take 300 mg by mouth daily.    [provider]  ondansetron (ZOFRAN ODT) 8 MG disintegrating tablet Take 1 tablet (8 mg total) by mouth every 8 (eight) hours as needed for nausea or vomiting. 01/11/18   Molpus, Talon Regala, MD  oxyCODONE (ROXICODONE) 15 MG immediate release tablet Take 15 mg by mouth every 4 (four) hours as needed for pain.  12/23/17   [provider]  pantoprazole (PROTONIX) 20 MG tablet Take 1 tablet (20 mg total) by mouth 2 (two) times daily. 10/07/16   Gareth Morgan, MD  PROAIR HFA 108 [phone] Base) MCG/ACT inhaler Inhale 2 puff into lungs every 4-6 hours as needed for shortness of breath/wheezing  02/02/16   [provider]  promethazine (PHENERGAN) 25 MG tablet Take 1 tablet (25 mg total) by mouth every 6 (six) hours as needed for nausea or vomiting. 10/07/16   Gareth Morgan, MD    Family History Family History  Problem Relation Age of Onset  . COPD Mother   . Colon cancer Neg Hx     Social History Social History   Tobacco Use  . Smoking status: Current Every Day Smoker    Packs/day: 0.25    Years: 40.00    Pack years: 10.00    Types: Cigarettes  . Smokeless tobacco: Never Used  . Tobacco comment: hx 1/2 -1 PPD  Substance Use Topics  . Alcohol use: No    Comment: 05/23/2012 hx alcohol abuse, quit ~ 2007  . Drug use: No    Types: Marijuana    Comment: 05/23/2012 "used marijuana in my teens"     Allergies   Patient has no known allergies.   Review of Systems Review of Systems  Constitutional: Negative for chills and fever.  HENT: Negative for ear pain and sore throat.   Eyes: Negative for pain and visual disturbance.  Respiratory: Positive for cough. Negative for shortness of breath.   Cardiovascular:  Negative for chest pain and palpitations.  Gastrointestinal: Negative for abdominal pain and vomiting.  Genitourinary: Negative for dysuria and hematuria.  Musculoskeletal: Positive for neck pain. Negative for arthralgias and back pain.  Skin: Negative for color change and rash.  Neurological: Negative for seizures, syncope and headaches.  All other systems reviewed and are negative.    Physical Exam Updated Vital Signs BP 112/72   Pulse 71   Temp 99.2 F (37.3 C) (Oral)   Resp 14   SpO2 97%   Physical Exam  Constitutional: He is oriented to person, place, and time. He appears well-developed and well-nourished.  HENT:  Head: Normocephalic and atraumatic.  Eyes: Conjunctivae are normal.  Neck: Neck supple.  Midline C-spine tenderness to lower C-spine.  Cardiovascular: Normal rate and regular rhythm.  No murmur heard. Pulmonary/Chest: Effort normal. No respiratory distress.  Productive cough. Diffuse rhonchi.  Abdominal: Soft. There is no tenderness.  Musculoskeletal: He exhibits tenderness. He exhibits no edema.  Tenderness to midline lower C-spine as above. No tenderness throughout T or L spine  Neurological: He is alert and oriented to person, place, and time.  Equal strength in all extremities, no sensory deficit. No CN deficit.  Skin: Skin is warm and dry.  Psychiatric: He has a normal mood and affect.  Nursing note and vitals reviewed.    ED Treatments / Results  Labs (all labs ordered are listed, but only abnormal results are displayed) Labs Reviewed  CBC WITH DIFFERENTIAL/PLATELET - Abnormal; Notable for the following components:      Result Value   WBC 12.6 (*)    RBC 3.66 (*)    Hemoglobin 12.1 (*)    HCT 35.2 (*)    RDW 16.8 (*)    Neutro Abs 10.8 (*)    Lymphs Abs 0.5 (*)    Monocytes Absolute 1.3 (*)    All other components within normal limits  BASIC METABOLIC PANEL - Abnormal; Notable for the following components:   Sodium 131 (*)    Potassium  5.3 (*)    Chloride 88 (*)    BUN 32 (*)    All other components within normal limits  URINALYSIS, ROUTINE W REFLEX MICROSCOPIC - Abnormal; Notable for the following components:  Color, Urine AMBER (*)    APPearance HAZY (*)    Hgb urine dipstick MODERATE (*)    Protein, ur 30 (*)    All other components within normal limits  LACTIC ACID, PLASMA - Abnormal; Notable for the following components:   Lactic Acid, Venous 2.6 (*)    All other components within normal limits  CULTURE, BLOOD (ROUTINE X 2)  CULTURE, BLOOD (ROUTINE X 2)  RAPID URINE DRUG SCREEN, HOSP PERFORMED    EKG EKG Interpretation  Date/Time:  Monday Feb 06 2018 16:06:44 EDT Ventricular Rate:  80 PR Interval:    QRS Duration: 82 QT Interval:  349 QTC Calculation: 403 R Axis:   84 Text Interpretation:  Sinus rhythm Anteroseptal infarct, age indeterminate Minimal ST elevation, inferior leads Since last tracing ectopy no longer seen Confirmed by Noemi Chapel [phone]) on 02/06/2018 7:08:51 PM   Radiology Dg Chest 2 View  Result Date: 02/06/2018 CLINICAL DATA:  Hypoxia, history of hypertension, COPD is seizures. Tobacco use since age 15. EXAM: CHEST - 2 VIEW COMPARISON:  07/01/2017 FINDINGS: Emphysematous hyperinflation of the lungs with new right small to moderate pleural effusion. Reverse cephalization and engorgement of pulmonary vasculature to the lower lobes likely on the basis of COPD. Slightly more confluent airspace opacity at the right lung base cannot exclude the possibility of pneumonia. Heart and mediastinal contours are within normal limits. Remote right-sided sixth rib fracture. IMPRESSION: COPD with new small to moderate sized right pleural effusion with reversed cephalization of pulmonary vasculature to the lower lobes likely on the basis of upper lobe COPD. More confluent opacities at the right lung base raise concern for right lower lobe pneumonia and/or atelectasis. Electronically Signed   By: Ashley Royalty  M.D.   On: 02/06/2018 17:53   Ct Head Wo Contrast  Result Date: 02/06/2018 CLINICAL DATA:  [place] this afternoon striking head, loss of consciousness, history of tonsillar cancer, COPD, GERD, hypertension, smoker EXAM: CT HEAD WITHOUT CONTRAST CT CERVICAL SPINE WITHOUT CONTRAST TECHNIQUE: Multidetector CT imaging of the head and cervical spine was performed following the standard protocol without intravenous contrast. Multiplanar CT image reconstructions of the cervical spine were also generated. COMPARISON:  CT soft tissue neck 04/23/2016 FINDINGS: CT HEAD FINDINGS Brain: Normal ventricular morphology. No midline shift or mass effect. Normal appearance of brain parenchyma. No intracranial hemorrhage, mass lesion, or evidence of acute infarction. No extra-axial fluid collections. Vascular: Mild atherosclerotic calcification of internal carotid arteries bilaterally at skull base Skull: Calvaria intact Sinuses/Orbits: Mucosal thickening LEFT maxillary sinus. Remaining visualized paranasal sinuses clear Other: N/A CT CERVICAL SPINE FINDINGS Alignment: Normal Skull base and vertebrae: Osseous mineralization normal. Visualized skull base intact. Vertebral body and disc space heights maintained. No fracture, subluxation or bone destruction. Soft tissues and spinal canal: Prevertebral soft tissues are mildly prominent with fluid versus mucous in the hypopharynx. Scattered atherosclerotic calcifications of the carotid and vertebral arteries. Diffuse infiltration of soft tissue planes in the parapharyngeal region, which may be due to prior radiation therapy or tumor, suboptimally assessed by noncontrast exam, appears more pronounced than on priors contrast-enhanced CT soft tissue neck study. Narrowing of airway at the larynx and supraglottic region. Suboptimal assessment for adenopathy due to loss of tissue planes. Disc levels: Broad-based disc herniation at C6-C7 indenting thecal sac. Upper chest: Lung apices appear  emphysematous but clear. Other: N/A IMPRESSION: No acute intracranial abnormalities.1 No acute cervical spine abnormalities. Emphysematous changes at lung apices. Fluid/mucous within the hypopharynx with narrowing of the airway at  the level of the larynx and supraglottic region. Diffuse infiltration of soft tissue planes in the cervical region which may be related to prior radiation therapy for tonsillar cancer though recurrent tumor could cause a similar appearance; recommend follow-up dedicated CT soft tissue neck imaging with contrast when clinical condition permits to further assess. Electronically Signed   By: Lavonia Dana M.D.   On: 02/06/2018 17:35   Ct Cervical Spine Wo Contrast  Result Date: 02/06/2018 CLINICAL DATA:  [place] this afternoon striking head, loss of consciousness, history of tonsillar cancer, COPD, GERD, hypertension, smoker EXAM: CT HEAD WITHOUT CONTRAST CT CERVICAL SPINE WITHOUT CONTRAST TECHNIQUE: Multidetector CT imaging of the head and cervical spine was performed following the standard protocol without intravenous contrast. Multiplanar CT image reconstructions of the cervical spine were also generated. COMPARISON:  CT soft tissue neck 04/23/2016 FINDINGS: CT HEAD FINDINGS Brain: Normal ventricular morphology. No midline shift or mass effect. Normal appearance of brain parenchyma. No intracranial hemorrhage, mass lesion, or evidence of acute infarction. No extra-axial fluid collections. Vascular: Mild atherosclerotic calcification of internal carotid arteries bilaterally at skull base Skull: Calvaria intact Sinuses/Orbits: Mucosal thickening LEFT maxillary sinus. Remaining visualized paranasal sinuses clear Other: N/A CT CERVICAL SPINE FINDINGS Alignment: Normal Skull base and vertebrae: Osseous mineralization normal. Visualized skull base intact. Vertebral body and disc space heights maintained. No fracture, subluxation or bone destruction. Soft tissues and spinal canal: Prevertebral soft  tissues are mildly prominent with fluid versus mucous in the hypopharynx. Scattered atherosclerotic calcifications of the carotid and vertebral arteries. Diffuse infiltration of soft tissue planes in the parapharyngeal region, which may be due to prior radiation therapy or tumor, suboptimally assessed by noncontrast exam, appears more pronounced than on priors contrast-enhanced CT soft tissue neck study. Narrowing of airway at the larynx and supraglottic region. Suboptimal assessment for adenopathy due to loss of tissue planes. Disc levels: Broad-based disc herniation at C6-C7 indenting thecal sac. Upper chest: Lung apices appear emphysematous but clear. Other: N/A IMPRESSION: No acute intracranial abnormalities.1 No acute cervical spine abnormalities. Emphysematous changes at lung apices. Fluid/mucous within the hypopharynx with narrowing of the airway at the level of the larynx and supraglottic region. Diffuse infiltration of soft tissue planes in the cervical region which may be related to prior radiation therapy for tonsillar cancer though recurrent tumor could cause a similar appearance; recommend follow-up dedicated CT soft tissue neck imaging with contrast when clinical condition permits to further assess. Electronically Signed   By: Lavonia Dana M.D.   On: 02/06/2018 17:35    Procedures Procedures (including critical care time)  Medications Ordered in ED Medications  oxyCODONE (Oxy IR/ROXICODONE) immediate release tablet 15 mg (15 mg Oral Given 02/06/18 1855)  azithromycin (ZITHROMAX) 500 mg in sodium chloride 0.9 % 250 mL IVPB (has no administration in time range)  sodium chloride 0.9 % bolus 1,000 mL (0 mLs Intravenous Stopped 02/06/18 1815)  cefTRIAXone (ROCEPHIN) 1 g in sodium chloride 0.9 % 100 mL IVPB (1 g Intravenous New Bag/Given 02/06/18 1843)     Initial Impression / Assessment and Plan / ED Course  I have reviewed the triage vital signs and the nursing notes.  Pertinent labs & imaging  results that were available during my care of the patient were reviewed by me and considered in my medical decision making (see chart for details).     Patient presents from home after a fall.  He reports he stood up from the couch and fell backwards.  He is not sure  why he fell.  He denies any chest pain or shortness of breath.  He does endorse a cough which is new but he cannot tell me how long it has been present.  He denies any fever or chills.  He is not sure if he lost consciousness however EMS reports that family stated he did lose consciousness.  He initially complained of hand pain but denies any hand pain here.  He does have mild tenderness to his lower cervical spine.  I will perform CT scan of his head, C-spine, basic labs, and EKG.  Of note, he is requiring 2 L of oxygen here to maintain oxygen saturation over 92%.  I briefly turned this off and he desaturated to 85%.  CT scan of his head and his cervical spine are without acute fracture.  He continues to require 2 L of oxygen via nasal cannula.  His chest x-ray shows evidence of pneumonia.  He was given Rocephin and azithromycin.  Fluid bolus also ordered.  Given these findings, patient will be admitted to medicine for further treatment.  Final Clinical Impressions(s) / ED Diagnoses   Final diagnoses:  Sepsis, due to unspecified organism [facility])    ED Discharge Orders    None       Clifton James, MD 02/06/18 2027    Noemi Chapel, MD 02/08/18 1011

## 2018-02-06 NOTE — ED Notes (Signed)
Pt refusing a rectal temp ; MD aware

## 2018-02-06 NOTE — ED Notes (Signed)
Pt leaving with [employer] in custody.

## 2018-02-06 NOTE — H&P (Addendum)
Russell Williamson FXT:[id number] DOB: 1956/06/25 DOA: 02/06/2018     PCP: Harvie Junior, MD   Outpatient Specialists:      Oncology   Dr. Dia Sitter Patient arrived to ER on 02/06/18 at 1511  Patient coming from:  home Lives   With family   Chief Complaint:  Chief Complaint  Patient presents with  . Fall    HPI: Russell Williamson is a 62 y.o. male with medical history significant of throat cancer, history of homelessness, anemia, osteoarthritis, cervical adenopathy, chronic lower back pain, chronic pancreatitis, constipation, chronic dyspnea, GERD, COPD, HTN     Presented with   Fall subjective fever cough. Patient fell down today he try to catch himself but fell onto the right side. He was trying to stand up and fell backwards. Denies LOC     No chest pain associated with that.  Reports cough active of sputum but no fevers no chills had decrease p.o. intake reports continues to smoke reports abdominal pain Last admission was in September for COPD exacerbation echogram at that time showed preserved EF. 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  See history of hypertension on Norvasc While in ER: Was found to have pneumonia on CXR Meeting sepsis criteria Given 2l NS and started on IV rocephin/azithro Refusing rectal temp   Following Medications were ordered in ER: Medications  oxyCODONE (Oxy IR/ROXICODONE) immediate release tablet 15 mg (15 mg Oral Given 02/06/18 1855)  cefTRIAXone (ROCEPHIN) 1 g in sodium chloride 0.9 % 100 mL IVPB (1 g Intravenous New Bag/Given 02/06/18 1843)  azithromycin (ZITHROMAX) 500 mg in sodium chloride 0.9 % 250 mL IVPB (has no administration in time range)  sodium chloride 0.9 % bolus 1,000 mL (0 mLs  Intravenous Stopped 02/06/18 1815)    Significant initial  Findings: Abnormal Labs Reviewed  CBC WITH DIFFERENTIAL/PLATELET - Abnormal; Notable for the following components:      Result Value   WBC 12.6 (*)    RBC 3.66 (*)    Hemoglobin 12.1 (*)    HCT 35.2 (*)    RDW 16.8 (*)    Neutro Abs 10.8 (*)    Lymphs Abs 0.5 (*)    Monocytes Absolute 1.3 (*)    All other components within normal limits  BASIC METABOLIC PANEL - Abnormal; Notable for the following components:   Sodium 131 (*)    Potassium 5.3 (*)    Chloride 88 (*)    BUN 32 (*)    All other components within normal limits  URINALYSIS, ROUTINE W REFLEX MICROSCOPIC - Abnormal; Notable for the following components:   Color, Urine AMBER (*)    APPearance HAZY (*)    Hgb urine dipstick MODERATE (*)    Protein, ur 30 (*)    All other components within normal limits  LACTIC ACID, PLASMA - Abnormal; Notable for the following components:   Lactic Acid, Venous 2.6 (*)    All other components within normal limits     Na 131 K 5.3  Cr  Up from baseline see below Lab Results  Component Value Date   CREATININE 0.80 02/06/2018   CREATININE 0.46 (L) 01/11/2018   CREATININE 0.57 (L) 07/03/2017      WBC  12  HG/HCT  Down  from baseline see below    Component Value Date/Time   HGB 12.1 (L) 02/06/2018 1616   HGB 13.1 01/27/2017 1341   HCT 35.2 (L) 02/06/2018 1616   HCT 37.7 (L) 01/27/2017 1341        Lactic Acid, Venous    Component Value Date/Time   LATICACIDVEN 2.6 (HH) 02/06/2018 1658     UA no evidence of UTI      CT HEAD   NON acute CT neck nonacute but cannot rule out possibility of recurrence of head and neck cancer CXR - COPD RLL PNA   ECG:  Personally reviewed by me showing: HR : 80 Rhythm:  NSR,   nonspecific changes,  QTC 403    ED Triage Vitals  Enc Vitals Group     BP 02/06/18 1600 106/72     Pulse Rate 02/06/18 1600 77     Resp 02/06/18 1730 15     Temp 02/06/18 1841 99.2 F (37.3  C)     Temp Source 02/06/18 1841 Oral     SpO2 02/06/18 1600 97 %     Weight --      Height --      Head Circumference --      Peak Flow --      Pain Score 02/06/18 1635 7     Pain Loc --      Pain Edu? --      Excl. in [place]? --   TMAX(24)@       Latest  Blood pressure 120/72, pulse 85, temperature 99.2 F (37.3 C), temperature source Oral, resp. rate 19, SpO2 98 %.     Hospitalist was called for admission for right lower lobe pneumonia in the setting of COPD and history of head and neck cancer   Review of Systems:    Pertinent positives include:  fatigue, weight loss, fall, productive cough  Constitutional:  No weight loss, night sweats, Fevers, chills, HEENT:  No headaches, Difficulty swallowing,Tooth/dental problems,Sore throat,  No sneezing, itching, ear ache, nasal congestion, post nasal drip,  Cardio-vascular:  No chest pain, Orthopnea, PND, anasarca, dizziness, palpitations.no Bilateral lower extremity swelling  GI:  No heartburn, indigestion, abdominal pain, nausea, vomiting, diarrhea, change in bowel habits, loss of appetite, melena, blood in stool, hematemesis Resp:  no shortness of breath at rest. No dyspnea on exertion, No excess mucus, no  No non-productive cough, No coughing up of blood.No change in color of mucus.No wheezing. Skin:  no rash or lesions. No jaundice GU:  no dysuria, change in color of urine, no urgency or frequency. No straining to urinate.  No flank pain.  Musculoskeletal:  No joint pain or no joint swelling. No decreased range of motion. No back pain.  Psych:  No change in mood or affect. No depression or anxiety. No memory loss.  Neuro: no localizing neurological complaints, no tingling, no weakness, no double vision, no gait abnormality, no slurred speech, no confusion  As per HPI otherwise 10 point review of systems negative.   Past Medical History:   Past Medical History:  Diagnosis Date  . Anemia   . Arthritis   . Cervical  adenopathy 04/26/2012  . Chronic back pain    lower  . Chronic pancreatitis ([place]   . Constipation   . COPD (chronic obstructive pulmonary disease) ([place])   . Daily headache 05/23/2012   "small ones"  . Deficiency anemia 04/15/2016  . Dyspnea    with exertion  . G tube feedings ([place])  hx of  feeding tube in stomach  . GERD (gastroesophageal reflux disease)   . History of blood transfusion ~ 2004   "from the pancreatitis"  . History of radiation therapy 02/02/2011-03/22/2011   head/neck,left tonsil  . Hx of radiation therapy 02/02/11 to 03/22/11   L tonsil  . Hypertension   . Insomnia   . 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 Neck malignant neoplasm ([place]) 05/23/2012  . Pancytopenia ([place]) 02/28/2014  . Poor venous access 04/28/2016  . Seizures ([place])    alcohol-related last seizure 3 yrs ago  . Thrush 11/02/2013  . 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 Urinary hesitancy       Past Surgical History:  Procedure Laterality Date  . BILE DUCT STENT PLACEMENT     hx of  . CHOLECYSTECTOMY  04/2005  . DIRECT LARYNGOSCOPY  05/23/2012   Procedure: DIRECT LARYNGOSCOPY;  Surgeon: Rozetta Nunnery, MD;  Location: [place] [facility];  Service: ENT;  Laterality: N/A;  . DIRECT LARYNGOSCOPY N/A 03/23/2013   Procedure: DIRECT LARYNGOSCOPY;  Surgeon: Rozetta Nunnery, MD;  Location: [place];  Service: ENT;  Laterality: N/A;  . ESOPHAGEAL DILATION N/A 03/23/2013   Procedure: ESOPHAGEAL DILATION;  Surgeon: Rozetta Nunnery, MD;  Location: [place];  Service: ENT;  Laterality: N/A;  . ESOPHAGOGASTRODUODENOSCOPY (EGD) WITH PROPOFOL N/A 03/01/2017   Procedure: ESOPHAGOGASTRODUODENOSCOPY (EGD) WITH PROPOFOL;  Surgeon: Ladene Artist, MD;  Location: [facility];  Service: Endoscopy;  Laterality: N/A;  . FLEXIBLE SIGMOIDOSCOPY N/A 03/01/2017   Procedure: FLEXIBLE SIGMOIDOSCOPY;  Surgeon: Ladene Artist, MD;  Location: [facility];  Service: Endoscopy;  Laterality: N/A;  . IR  GENERIC HISTORICAL  04/29/2016   IR US GUIDE VASC ACCESS RIGHT 04/29/2016 [facility]  . IR GENERIC HISTORICAL  04/29/2016   IR FLUORO GUIDE CV LINE RIGHT 04/29/2016 [facility]  . IR GENERIC HISTORICAL  05/12/2016   IR US GUIDE VASC ACCESS LEFT 05/12/2016 [facility]  . IR GENERIC HISTORICAL  05/12/2016   IR FLUORO GUIDE CV LINE LEFT 05/12/2016 [facility]  . IR GENERIC HISTORICAL  06/28/2016   IR US GUIDE VASC ACCESS RIGHT 06/28/2016 Sandi Mariscal, MD [facility]  . IR GENERIC HISTORICAL  06/28/2016   IR FLUORO GUIDE PORT INSERTION RIGHT 06/28/2016 Sandi Mariscal, MD [facility]  . IR REMOVAL TUN ACCESS W/ PORT W/O FL MOD SED  02/10/2017  . MASS BIOPSY  05/23/2012   Procedure: NECK MASS BIOPSY;  Surgeon: Rozetta Nunnery, MD;  Location: [facility];  Service: ENT;  Laterality: N/A;  . RADICAL NECK DISSECTION  05/23/2012   w/mass excision  . RADICAL NECK DISSECTION  05/23/2012   Procedure: RADICAL NECK DISSECTION;  Surgeon: Rozetta Nunnery, MD;  Location: [place] [place];  Service: ENT;  Laterality: Left;  . TIBIA FRACTURE SURGERY  1990's   left    Social History:  Ambulatory  Cane,      reports that he has been smoking cigarettes.  He has a 10.00 pack-year smoking history. He has never used smokeless tobacco. He reports that he does not drink alcohol or use drugs.     Family History:   Family History  Problem Relation Age of Onset  . COPD Mother   . Colon cancer Neg Hx     Allergies: No Known Allergies   Prior to Admission medications   Medication Sig Start Date End Date Taking? Authorizing Provider  amLODipine (NORVASC) 2.5 MG tablet Take 2.5 mg by  mouth daily. 12/24/16   [provider]  folic acid (FOLVITE) 1 MG tablet Take 1 mg by mouth daily.  05/14/15   [provider]  lipase/protease/amylase (CREON-12/PANCREASE) 12000 UNITS CPEP capsule Take 1 capsule by mouth 4 (four) times daily.    [provider]  mirtazapine (REMERON) 30 MG tablet Take 30 mg by mouth  at bedtime.    [provider]  Omega-3 Fatty Acids (OMEGA-3 FISH OIL) 300 MG CAPS Take 300 mg by mouth daily.    [provider]  ondansetron (ZOFRAN ODT) 8 MG disintegrating tablet Take 1 tablet (8 mg total) by mouth every 8 (eight) hours as needed for nausea or vomiting. 01/11/18   Molpus, John, MD  oxyCODONE (ROXICODONE) 15 MG immediate release tablet Take 15 mg by mouth every 4 (four) hours as needed for pain.  12/23/17   [provider]  pantoprazole (PROTONIX) 20 MG tablet Take 1 tablet (20 mg total) by mouth 2 (two) times daily. 10/07/16   Gareth Morgan, MD  PROAIR HFA 108 [phone] Base) MCG/ACT inhaler Inhale 2 puff into lungs every 4-6 hours as needed for shortness of breath/wheezing 02/02/16   [provider]  promethazine (PHENERGAN) 25 MG tablet Take 1 tablet (25 mg total) by mouth every 6 (six) hours as needed for nausea or vomiting. 10/07/16   Gareth Morgan, MD   Physical Exam: Blood pressure 120/72, pulse 85, temperature 99.2 F (37.3 C), temperature source Oral, resp. rate 19, SpO2 98 %. 1. General:  in No Acute distress   Chronically ill -appearing 2. Psychological: Alert and   Oriented 3. Head/ENT:     Dry Mucous Membranes                          Head Non traumatic, neck supple                            Poor Dentition 4. SKIN  decreased Skin turgor,  Skin clean Dry and intact no rash 5. Heart: Regular rate and rhythm no Murmur, no Rub or gallop 6. Lungs: , no wheezes some crackles  Coarse breath sounds 7. Abdomen: Soft,  non-tender, Non distended  bowel sounds present, thin 8. Lower extremities: no clubbing, cyanosis, or edema 9. Neurologically Grossly intact, moving all 4 extremities equally  10. MSK: Normal range of motion   LABS:     Recent Labs  Lab 02/06/18 1616  WBC 12.6*  NEUTROABS 10.8*  HGB 12.1*  HCT 35.2*  MCV 96.2  PLT 762   Basic Metabolic Panel: Recent Labs  Lab 02/06/18 1616  NA 131*  K 5.3*  CL 88*  CO2 31   GLUCOSE 86  BUN 32*  CREATININE 0.80  CALCIUM 8.9      No results for input(s): AST, ALT, ALKPHOS, BILITOT, PROT, ALBUMIN in the last 168 hours. No results for input(s): LIPASE, AMYLASE in the last 168 hours. No results for input(s): AMMONIA in the last 168 hours.    HbA1C: No results for input(s): HGBA1C in the last 72 hours. CBG: No results for input(s): GLUCAP in the last 168 hours.    Urine analysis:    Component Value Date/Time   COLORURINE AMBER (A) 02/06/2018 1616   APPEARANCEUR HAZY (A) 02/06/2018 1616   LABSPEC 1.018 02/06/2018 1616   PHURINE 5.0 02/06/2018 1616   GLUCOSEU NEGATIVE 02/06/2018 1616   HGBUR MODERATE (A) 02/06/2018 1616  BILIRUBINUR NEGATIVE 02/06/2018 1616   KETONESUR NEGATIVE 02/06/2018 1616   PROTEINUR 30 (A) 02/06/2018 1616   UROBILINOGEN 0.2 08/31/2014 2350   NITRITE NEGATIVE 02/06/2018 1616   LEUKOCYTESUR NEGATIVE 02/06/2018 1616       Cultures:    Component Value Date/Time   SDES BLOOD LEFT HAND 03/28/2009 1856   SPECREQUEST BOTTLES DRAWN AEROBIC ONLY 2CC 03/28/2009 1856   CULT NO GROWTH 5 DAYS 03/28/2009 1856   REPTSTATUS 04/04/2009 FINAL 03/28/2009 1856     Radiological Exams on Admission: Dg Chest 2 View  Result Date: 02/06/2018 CLINICAL DATA:  Hypoxia, history of hypertension, COPD is seizures. Tobacco use since age 44. EXAM: CHEST - 2 VIEW COMPARISON:  07/01/2017 FINDINGS: Emphysematous hyperinflation of the lungs with new right small to moderate pleural effusion. Reverse cephalization and engorgement of pulmonary vasculature to the lower lobes likely on the basis of COPD. Slightly more confluent airspace opacity at the right lung base cannot exclude the possibility of pneumonia. Heart and mediastinal contours are within normal limits. Remote right-sided sixth rib fracture. IMPRESSION: COPD with new small to moderate sized right pleural effusion with reversed cephalization of pulmonary vasculature to the lower lobes likely on the  basis of upper lobe COPD. More confluent opacities at the right lung base raise concern for right lower lobe pneumonia and/or atelectasis. Electronically Signed   By: Ashley Royalty M.D.   On: 02/06/2018 17:53   Ct Head Wo Contrast  Result Date: 02/06/2018 CLINICAL DATA:  [place] this afternoon striking head, loss of consciousness, history of tonsillar cancer, COPD, GERD, hypertension, smoker EXAM: CT HEAD WITHOUT CONTRAST CT CERVICAL SPINE WITHOUT CONTRAST TECHNIQUE: Multidetector CT imaging of the head and cervical spine was performed following the standard protocol without intravenous contrast. Multiplanar CT image reconstructions of the cervical spine were also generated. COMPARISON:  CT soft tissue neck 04/23/2016 FINDINGS: CT HEAD FINDINGS Brain: Normal ventricular morphology. No midline shift or mass effect. Normal appearance of brain parenchyma. No intracranial hemorrhage, mass lesion, or evidence of acute infarction. No extra-axial fluid collections. Vascular: Mild atherosclerotic calcification of internal carotid arteries bilaterally at skull base Skull: Calvaria intact Sinuses/Orbits: Mucosal thickening LEFT maxillary sinus. Remaining visualized paranasal sinuses clear Other: N/A CT CERVICAL SPINE FINDINGS Alignment: Normal Skull base and vertebrae: Osseous mineralization normal. Visualized skull base intact. Vertebral body and disc space heights maintained. No fracture, subluxation or bone destruction. Soft tissues and spinal canal: Prevertebral soft tissues are mildly prominent with fluid versus mucous in the hypopharynx. Scattered atherosclerotic calcifications of the carotid and vertebral arteries. Diffuse infiltration of soft tissue planes in the parapharyngeal region, which may be due to prior radiation therapy or tumor, suboptimally assessed by noncontrast exam, appears more pronounced than on priors contrast-enhanced CT soft tissue neck study. Narrowing of airway at the larynx and supraglottic  region. Suboptimal assessment for adenopathy due to loss of tissue planes. Disc levels: Broad-based disc herniation at C6-C7 indenting thecal sac. Upper chest: Lung apices appear emphysematous but clear. Other: N/A IMPRESSION: No acute intracranial abnormalities.1 No acute cervical spine abnormalities. Emphysematous changes at lung apices. Fluid/mucous within the hypopharynx with narrowing of the airway at the level of the larynx and supraglottic region. Diffuse infiltration of soft tissue planes in the cervical region which may be related to prior radiation therapy for tonsillar cancer though recurrent tumor could cause a similar appearance; recommend follow-up dedicated CT soft tissue neck imaging with contrast when clinical condition permits to further assess. Electronically Signed   By: Lavonia Dana  M.D.   On: 02/06/2018 17:35   Ct Cervical Spine Wo Contrast  Result Date: 02/06/2018 CLINICAL DATA:  [place] this afternoon striking head, loss of consciousness, history of tonsillar cancer, COPD, GERD, hypertension, smoker EXAM: CT HEAD WITHOUT CONTRAST CT CERVICAL SPINE WITHOUT CONTRAST TECHNIQUE: Multidetector CT imaging of the head and cervical spine was performed following the standard protocol without intravenous contrast. Multiplanar CT image reconstructions of the cervical spine were also generated. COMPARISON:  CT soft tissue neck 04/23/2016 FINDINGS: CT HEAD FINDINGS Brain: Normal ventricular morphology. No midline shift or mass effect. Normal appearance of brain parenchyma. No intracranial hemorrhage, mass lesion, or evidence of acute infarction. No extra-axial fluid collections. Vascular: Mild atherosclerotic calcification of internal carotid arteries bilaterally at skull base Skull: Calvaria intact Sinuses/Orbits: Mucosal thickening LEFT maxillary sinus. Remaining visualized paranasal sinuses clear Other: N/A CT CERVICAL SPINE FINDINGS Alignment: Normal Skull base and vertebrae: Osseous mineralization  normal. Visualized skull base intact. Vertebral body and disc space heights maintained. No fracture, subluxation or bone destruction. Soft tissues and spinal canal: Prevertebral soft tissues are mildly prominent with fluid versus mucous in the hypopharynx. Scattered atherosclerotic calcifications of the carotid and vertebral arteries. Diffuse infiltration of soft tissue planes in the parapharyngeal region, which may be due to prior radiation therapy or tumor, suboptimally assessed by noncontrast exam, appears more pronounced than on priors contrast-enhanced CT soft tissue neck study. Narrowing of airway at the larynx and supraglottic region. Suboptimal assessment for adenopathy due to loss of tissue planes. Disc levels: Broad-based disc herniation at C6-C7 indenting thecal sac. Upper chest: Lung apices appear emphysematous but clear. Other: N/A IMPRESSION: No acute intracranial abnormalities.1 No acute cervical spine abnormalities. Emphysematous changes at lung apices. Fluid/mucous within the hypopharynx with narrowing of the airway at the level of the larynx and supraglottic region. Diffuse infiltration of soft tissue planes in the cervical region which may be related to prior radiation therapy for tonsillar cancer though recurrent tumor could cause a similar appearance; recommend follow-up dedicated CT soft tissue neck imaging with contrast when clinical condition permits to further assess. Electronically Signed   By: Lavonia Dana M.D.   On: 02/06/2018 17:35    Chart has been reviewed    Assessment/Plan   62 y.o. male with medical history significant of throat cancer, history of homelessness, anemia, osteoarthritis, cervical adenopathy, chronic lower back pain, chronic pancreatitis, constipation, chronic dyspnea, GERD, COPD, HTN Admitted for fall at home and right lower lobe pneumonia and sepsis criteria  Present on Admission: . CAP (community acquired pneumonia) -  - will admit for treatment of CAP  will start on appropriate antibiotic coverage.   Obtain:  sputum cultures,                  blood cultures if febrile or if decompensates.                   strep pneumo UA antigen,                      Provide oxygen as needed.                       Given history of malignancy and somewhat atypical presentation will obtain a CT to further evaluate for any possibility of metastatic spread of previously known throat cancer  Sepsis physiology await results of blood cultures rehydrate with aggressive IV fluids in order IV antibiotics serial lactic acid . History of cancer  tonsil will need to arrange for follow-up of oncology and evaluate for possibility of metastatic spread given continuous weight loss  . HTN (hypertension) soft blood pressures we will hold home medications . Hypothyroidism  - check TSH treat as needed . Malnutrition of moderate degree (HCC) check prealbumin and order nutritional consult . COPD with chronic bronchitis ([place]) -currently no wheezes to suggest acute COPD exacerbation but likely contributing to dyspnea will make sure has nebulizers ordered as needed and scheduled DuoNeb    . Hyponatremia we will rehydrate order urine electrolytes and urine Osmolarity  . Dehydration check orthostatics  will rehydrate . Tobacco abuse -  - Spoke about importance of quitting,   - order nicotine patch   - nursing tobacco cessation protocol Street of alcohol abuse currently denies drinking every day but will monitor for any signs of alcohol withdrawal Cocaine abuse could be possibly contributing to chest discomfort Chest pain in the setting of coughing unlikely to be cardiac but given unclear history and possible presyncope will cycle cardiac enzymes . Anemia in chronic illness - stable continue to monitor currently asymptomatic  Other plan as per orders.  DVT prophylaxis:   Lovenox     Code Status:  FULL CODE  as per patient  I had personally discussed CODE STATUS with patient    Family Communication:   Family not  at  Bedside    Disposition Plan:   To home once workup is complete and patient is stable                         Would benefit from PT/OT eval prior to DC   ordered                                       Social work consult given substance abuse history      Consults called: none  Admission status:  inpatient      Level of care   tele            Toy Baker 02/06/2018, 10:10 PM    [facility]  Pager [phone]   after 2 AM please page floor coverage PA If 7AM-7PM, please contact the day team taking care of the patient  [email or web address]  Password [email or web address]

## 2018-02-06 NOTE — ED Provider Notes (Signed)
I saw and evaluated the patient, reviewed the resident's note and I agree with the findings and plan.  Pertinent History: The patient is a 62 year old male, history of a malignant neck cancer, pancytopenia, history of alcohol-related seizures as well as COPD and chronic pancreatitis.  He presents today by ambulance after having a fall.  The rest of the details are very unclear as the patient is not able to give a very clear history of what happened, he thinks that he was standing up and fell but it were not sure which room it was then, he complains of a headache, some neck pain but also some right hand pain.  Of note the patient also complains of some coughing.  Pertinent Exam findings: On exam the patient is extremely cachectic, diffuse muscle wasting, dry mucous membranes, his respiratory rate is mildly increased and he does have some scattered rhonchi.  He does have expiratory wheezing.  He has no tenderness over the cervical thoracic or lumbar spines, he has no deformities or step-offs, his abdomen is scaphoid but nontender, there is no peripheral edema.  He is able to raise both arms, both grips and raise both legs in a normal fashion.  It is not clear exactly what happened however the patient does have a new cough, he is slightly hypoxic, he has no obvious signs of external trauma but will need some further work-up as to the cause of his fall and cough.  D/w Dr. Roel Cluck who will admit  .Critical Care Performed by: Noemi Chapel, MD Authorized by: Noemi Chapel, MD   Critical care provider statement:    Critical care time (minutes):  35   Critical care time was exclusive of:  Separately billable procedures and treating other patients and teaching time   Critical care was necessary to treat or prevent imminent or life-threatening deterioration of the following conditions:  Sepsis   Critical care was time spent personally by me on the following activities:  Blood draw for specimens, development  of treatment plan with patient or surrogate, discussions with consultants, evaluation of patient's response to treatment, examination of patient, obtaining history from patient or surrogate, ordering and performing treatments and interventions, ordering and review of laboratory studies, ordering and review of radiographic studies, pulse oximetry, re-evaluation of patient's condition and review of old charts   Patient in fact does have what looks like an effusion and a possible pneumonia.  Will need admission and antibiotics.  His lactic acid is elevated.   WBC 12.6,   I personally interpreted the EKG as well as the resident and agree with the interpretation on the resident's chart.   EKG Interpretation  Date/Time:  Monday Feb 06 2018 16:06:44 EDT Ventricular Rate:  80 PR Interval:    QRS Duration: 82 QT Interval:  349 QTC Calculation: 403 R Axis:   84 Text Interpretation:  Sinus rhythm Anteroseptal infarct, age indeterminate Minimal ST elevation, inferior leads Since last tracing ectopy no longer seen Confirmed by Noemi Chapel [phone]) on 02/06/2018 7:08:51 PM       Final diagnoses:  Sepsis, due to unspecified organism [facility])      Noemi Chapel, MD 02/08/18 1011

## 2018-02-06 NOTE — ED Triage Notes (Signed)
Pt arrives from home for a fall that occurred about 1 hour PTA, pt tried to catch himself and fell onto right hand.. Family reported that pt had a positive LOC and did hit head.

## 2018-02-06 NOTE — ED Notes (Signed)
Report given to [facility].

## 2018-02-07 ENCOUNTER — Inpatient Hospital Stay (HOSPITAL_COMMUNITY): Payer: Medicaid Other

## 2018-02-07 ENCOUNTER — Encounter (HOSPITAL_COMMUNITY): Payer: Self-pay | Admitting: Radiology

## 2018-02-07 DIAGNOSIS — W19XXXA Unspecified fall, initial encounter: Secondary | ICD-10-CM

## 2018-02-07 LAB — CBC
HEMATOCRIT: 33.3 % — AB (ref 39.0–52.0)
HEMOGLOBIN: 11.2 g/dL — AB (ref 13.0–17.0)
MCH: 32 pg (ref 26.0–34.0)
MCHC: 33.6 g/dL (ref 30.0–36.0)
MCV: 95.1 fL (ref 78.0–100.0)
Platelets: 172 10*3/uL (ref 150–400)
RBC: 3.5 MIL/uL — ABNORMAL LOW (ref 4.22–5.81)
RDW: 16.7 % — ABNORMAL HIGH (ref 11.5–15.5)
WBC: 9.2 10*3/uL (ref 4.0–10.5)

## 2018-02-07 LAB — TSH: TSH: 2.254 u[IU]/mL (ref 0.350–4.500)

## 2018-02-07 LAB — PREALBUMIN: Prealbumin: <5 mg/dL — ABNORMAL LOW (ref 18–38)

## 2018-02-07 LAB — LACTIC ACID, PLASMA
Lactic Acid, Venous: 2.5 mmol/L (ref 0.5–1.9)
Lactic Acid, Venous: 2 mmol/L (ref 0.5–1.9)

## 2018-02-07 LAB — COMPREHENSIVE METABOLIC PANEL
ALBUMIN: 2.7 g/dL — AB (ref 3.5–5.0)
ALK PHOS: 74 U/L (ref 38–126)
ALT: 134 U/L — ABNORMAL HIGH (ref 17–63)
ANION GAP: 11 (ref 5–15)
AST: 267 U/L — ABNORMAL HIGH (ref 15–41)
BUN: 18 mg/dL (ref 6–20)
CO2: 27 mmol/L (ref 22–32)
Calcium: 8.4 mg/dL — ABNORMAL LOW (ref 8.9–10.3)
Chloride: 98 mmol/L — ABNORMAL LOW (ref 101–111)
Creatinine, Ser: 0.49 mg/dL — ABNORMAL LOW (ref 0.61–1.24)
GFR calc Af Amer: >60 mL/min (ref >60)
GFR calc non Af Amer: >60 mL/min (ref >60)
GLUCOSE: 79 mg/dL (ref 65–99)
POTASSIUM: 4.4 mmol/L (ref 3.5–5.1)
Sodium: 136 mmol/L (ref 135–145)
Total Bilirubin: 0.6 mg/dL (ref 0.3–1.2)
Total Protein: 6 g/dL — ABNORMAL LOW (ref 6.5–8.1)

## 2018-02-07 LAB — TROPONIN I
Troponin I: 0.06 ng/mL (ref <0.03)
Troponin I: 0.05 ng/mL (ref <0.03)

## 2018-02-07 LAB — PHOSPHORUS: PHOSPHORUS: 2.6 mg/dL (ref 2.5–4.6)

## 2018-02-07 LAB — PROTIME-INR
INR: 1.34
INR: 1.4
Prothrombin Time: 16.5 seconds — ABNORMAL HIGH (ref 11.4–15.2)
Prothrombin Time: 17 seconds — ABNORMAL HIGH (ref 11.4–15.2)

## 2018-02-07 LAB — STREP PNEUMONIAE URINARY ANTIGEN: STREP PNEUMO URINARY ANTIGEN: NEGATIVE

## 2018-02-07 LAB — APTT: aPTT: 29 seconds (ref 24–36)

## 2018-02-07 LAB — CREATININE, URINE, RANDOM: CREATININE, URINE: 122.66 mg/dL

## 2018-02-07 LAB — MRSA PCR SCREENING: MRSA by PCR: POSITIVE — AB

## 2018-02-07 LAB — HIV ANTIBODY (ROUTINE TESTING W REFLEX): HIV Screen 4th Generation wRfx: NONREACTIVE

## 2018-02-07 LAB — MAGNESIUM: MAGNESIUM: 1.9 mg/dL (ref 1.7–2.4)

## 2018-02-07 LAB — SODIUM, URINE, RANDOM: Sodium, Ur: <10 mmol/L

## 2018-02-07 LAB — PROCALCITONIN: PROCALCITONIN: 10.5 ng/mL

## 2018-02-07 MED ORDER — SODIUM CHLORIDE 0.9 % IV BOLUS
500.0000 mL | Freq: Once | INTRAVENOUS | Status: AC
  Administered 2018-02-07: 500 mL via INTRAVENOUS

## 2018-02-07 MED ORDER — IOPAMIDOL (ISOVUE-370) INJECTION 76%
INTRAVENOUS | Status: AC
  Filled 2018-02-07: qty 100

## 2018-02-07 MED ORDER — IOPAMIDOL (ISOVUE-370) INJECTION 76%
100.0000 mL | Freq: Once | INTRAVENOUS | Status: AC | PRN
  Administered 2018-02-07: 80 mL via INTRAVENOUS

## 2018-02-07 MED ORDER — ORAL CARE MOUTH RINSE
15.0000 mL | Freq: Two times a day (BID) | OROMUCOSAL | Status: DC
  Administered 2018-02-07 – 2018-02-10 (×6): 15 mL via OROMUCOSAL

## 2018-02-07 MED ORDER — ENSURE ENLIVE PO LIQD
237.0000 mL | Freq: Three times a day (TID) | ORAL | Status: DC
  Administered 2018-02-07 – 2018-02-10 (×10): 237 mL via ORAL

## 2018-02-07 MED ORDER — MUPIROCIN 2 % EX OINT
1.0000 "application " | TOPICAL_OINTMENT | Freq: Two times a day (BID) | CUTANEOUS | Status: AC
  Administered 2018-02-07 – 2018-02-11 (×10): 1 via NASAL
  Filled 2018-02-07 (×3): qty 22

## 2018-02-07 MED ORDER — CHLORHEXIDINE GLUCONATE CLOTH 2 % EX PADS
6.0000 | MEDICATED_PAD | Freq: Every day | CUTANEOUS | Status: AC
Start: 2018-02-07 — End: 2018-02-11
  Administered 2018-02-07 – 2018-02-11 (×5): 6 via TOPICAL

## 2018-02-07 MED ORDER — MAGNESIUM SULFATE 2 GM/50ML IV SOLN
2.0000 g | Freq: Once | INTRAVENOUS | Status: AC
  Administered 2018-02-07: 2 g via INTRAVENOUS
  Filled 2018-02-07: qty 50

## 2018-02-07 MED ORDER — AZITHROMYCIN 250 MG PO TABS
500.0000 mg | ORAL_TABLET | Freq: Every day | ORAL | Status: AC
  Administered 2018-02-07 – 2018-02-10 (×4): 500 mg via ORAL
  Filled 2018-02-07 (×4): qty 2

## 2018-02-07 NOTE — Progress Notes (Signed)
Initial Nutrition Assessment  DOCUMENTATION CODES:   Severe malnutrition in context of chronic illness, Underweight  INTERVENTION:    Ensure Enlive po TID, each supplement provides 350 kcal and 20 grams of protein  NUTRITION DIAGNOSIS:   Severe Malnutrition related to chronic illness(chronic pancreatitis, COPD, hx of throat cancer) as evidenced by severe muscle depletion, severe fat depletion  GOAL:   Patient will meet greater than or equal to 90% of their needs  MONITOR:   PO intake, Supplement acceptance, Labs, I & O's, Skin  REASON FOR ASSESSMENT:   Consult Assessment of nutrition requirement/status  ASSESSMENT:   62 yo Male with PMH significant of throat cancer, history of homelessness, anemia, osteoarthritis, cervical adenopathy, chronic lower back pain, chronic pancreatitis, constipation, chronic dyspnea, GERD, COPD, HTN.  Pt sleeping soundly upon RD visit. Name called x 2. Unable to arouse. Pt with emesis bag with contents inside. Known to Clinical Nutrition during previous hospitalizations.    Pt assessed in 08/2017. With hx of severe malnutrition which is ongoing. Noted pt also with hx of swallowing difficulty due to hx of throat cancer. Spoke with RN. RD & RN feel he would benefit from swallow evaluation.  No % PO intake records available at this time. Medications include folvite, MVI and thiamine. Labs reviewed.  NUTRITION - FOCUSED PHYSICAL EXAM:    Most Recent Value  Orbital Region  Unable to assess  Upper Arm Region  Severe depletion  Thoracic and Lumbar Region  Unable to assess  Buccal Region  Moderate depletion  Temple Region  Moderate depletion  Clavicle Bone Region  Severe depletion  Clavicle and Acromion Bone Region  Severe depletion  Scapular Bone Region  Unable to assess  Dorsal Hand  Unable to assess  Patellar Region  Severe depletion  Anterior Thigh Region  Severe depletion  Posterior Calf Region  Severe depletion  Edema (RD  Assessment)  None    Diet Order:   Diet Order           Diet regular Room service appropriate? Yes; Fluid consistency: Thin  Diet effective now         EDUCATION NEEDS:   No education needs have been identified at this time  Skin:  Skin Assessment: Reviewed RN Assessment  Last BM:  5/5   Intake/Output Summary (Last 24 hours) at 02/07/2018 1407 Last data filed at 02/07/2018 0100 Gross per 24 hour  Intake 300 ml  Output -  Net 300 ml   Height:   Ht Readings from Last 1 Encounters:  02/07/18 5\' 9"  (1.753 m)   Weight:   Wt Readings from Last 1 Encounters:  07/02/17 94 lb 9.6 oz (42.9 kg)   Ideal Body Weight:  72.7 kg  BMI:  Body mass index is 13.97 kg/m.  Estimated Nutritional Needs:   Kcal:  1500-1700  Protein:  75-90 gm  Fluid:  1.5-1.7 L  Arthur Holms, RD, LDN Pager #: [phone] After-Hours Pager #: [phone]

## 2018-02-07 NOTE — Evaluation (Signed)
Physical Therapy Evaluation Patient Details Name: Russell Williamson MRN: [id number] DOB: Dec 04, 1955 Today's Date: 02/07/2018   History of Present Illness  Pt is a 62 y.o. male admitted 02/06/18 post-fall with fever and cough; CXR showed pneumonia. Worked up for CAP. Head CT shows no acute intracranial abnormality. PMH includes throat CA (s/p radiation 2012), OA, cervical adenopathy, chronic LBP, chronic pancreatitis, COPD, HTN, h/o homelessness.     Clinical Impression  Pt presents with an overall decrease in functional mobility secondary to above. PTA, pt reports mod indep with amb and ADLs using SPC; lives with family who assists with cooking and driving. Today, amb 150' with RW and intermittent min guard for balance; stability improved with use of RW compared to single UE support. SpO2 down to 84% on RA (unsure if accurate reading due to unreliable pleth), replaced O2 [place]; pt asymptomatic. Educ on fall risk reduction. Pt reports no concerns returning to current home environment. Pt would benefit from continued acute PT services to maximize functional mobility and independence prior to d/c with HHPT services.   Supine BP 111/69 Sitting BP 103/24 Standing BP 115/77    Follow Up Recommendations Home health PT;Supervision - Intermittent    Equipment Recommendations  Rolling walker with 5" wheels    Recommendations for Other Services       Precautions / Restrictions Precautions Precautions: Fall Precaution Comments: Watch SpO2 Restrictions Weight Bearing Restrictions: No      Mobility  Bed Mobility Overal bed mobility: Modified Independent             General bed mobility comments: Increased time and effort  Transfers Overall transfer level: Needs assistance Equipment used: None;Rolling walker (2 wheeled) Transfers: Sit to/from Stand Sit to Stand: Supervision            Ambulation/Gait Ambulation/Gait assistance: Min guard Ambulation Distance (Feet): 150  Feet Assistive device: Rolling walker (2 wheeled) Gait Pattern/deviations: Step-through pattern;Decreased stride length;Decreased weight shift to right;Antalgic Gait velocity: Decreased Gait velocity interpretation: <1.8 ft/sec, indicate of risk for recurrent falls General Gait Details: Slow amb with RW and intermittent min guard for balance; cues to maintain closer proximity to RW. Pt denies pain and RLE ROM WFL, but decreased weight shift to RLE resulting in antalgic-appearing amb  Stairs            Wheelchair Mobility    Modified Rankin (Stroke Patients Only)       Balance Overall balance assessment: Needs assistance   Sitting balance-Leahy Scale: Good       Standing balance-Leahy Scale: Fair Standing balance comment: Can static stand with no UE support                             Pertinent Vitals/Pain Pain Assessment: No/denies pain    Home Living Family/patient expects to be discharged to:: Private residence Living Arrangements: Other relatives Available Help at Discharge: Family;Available PRN/intermittently Type of Home: House Home Access: Stairs to enter   Entrance Stairs-Number of Steps: 1 Home Layout: One level Home Equipment: Kasandra Knudsen - single point Additional Comments: Lives with niece and her kids.     Prior Function Level of Independence: Independent with assistive device(s)         Comments: Reports mod indep with SPC. Denies falls (although fall supposedly led to admission). Indep with ADLs. Niece/family does cooking and driving     Hand Dominance        Extremity/Trunk Assessment   Upper Extremity  Assessment Upper Extremity Assessment: Overall WFL for tasks assessed    Lower Extremity Assessment Lower Extremity Assessment: RLE deficits/detail;Overall WFL for tasks assessed RLE Deficits / Details: Pt reports numbness below R knee; strength and light touch testing WFL    Cervical / Trunk Assessment Cervical / Trunk  Assessment: Kyphotic  Communication   Communication: HOH  Cognition Arousal/Alertness: Awake/alert Behavior During Therapy: Flat affect Overall Cognitive Status: No family/caregiver present to determine baseline cognitive functioning Area of Impairment: Safety/judgement;Awareness                         Safety/Judgement: Decreased awareness of deficits Awareness: Emergent          General Comments General comments (skin integrity, edema, etc.): BP stable; pt with no c/o lightheadedness. SpO2 84% on RA (unreliable pleth); replaced O2 [place]    Exercises     Assessment/Plan    PT Assessment Patient needs continued PT services  PT Problem List Decreased activity tolerance;Decreased balance;Decreased mobility;Decreased knowledge of use of DME       PT Treatment Interventions DME instruction;Gait training;Stair training;Functional mobility training;Therapeutic activities;Therapeutic exercise;Balance training;Patient/family education    PT Goals (Current goals can be found in the Care Plan section)  Acute Rehab PT Goals Patient Stated Goal: Return home PT Goal Formulation: With patient Time For Goal Achievement: 02/21/18 Potential to Achieve Goals: Good    Frequency Min 3X/week   Barriers to discharge        Co-evaluation               AM-PAC PT "6 Clicks" Daily Activity  Outcome Measure Difficulty turning over in bed (including adjusting bedclothes, sheets and blankets)?: None Difficulty moving from lying on back to sitting on the side of the bed? : None Difficulty sitting down on and standing up from a chair with arms (e.g., wheelchair, bedside commode, etc,.)?: A Little Help needed moving to and from a bed to chair (including a wheelchair)?: A Little Help needed walking in hospital room?: A Little Help needed climbing 3-5 steps with a railing? : A Little 6 Click Score: 20    End of Session Equipment Utilized During Treatment: Gait belt Activity  Tolerance: Patient tolerated treatment well;Patient limited by fatigue Patient left: in chair;with call bell/phone within reach;with chair alarm set Nurse Communication: Mobility status PT Visit Diagnosis: Other abnormalities of gait and mobility (R26.89)    Time: 4076-8088 PT Time Calculation (min) (ACUTE ONLY): 35 min   Charges:   PT Evaluation $PT Eval Moderate Complexity: 1 Mod PT Treatments $Gait Training: 8-22 mins   PT G Codes:       Mabeline Caras, PT, DPT Acute Rehab Services  Pager: [place] 02/07/2018, 3:27 PM

## 2018-02-07 NOTE — Progress Notes (Signed)
Pt admitted to the unit. Pt a/o x4. Pt was given a bath on admission, found that pt had his trazodone bottle taped to his leg. He stated that his family member that are living in the house often times take his medication. He also denied any alcohol intake.   3- md paged to make aware of critical troponin 0.08 and lactic acid of 2.5 which is trending down. Pt had a 20 beat run of vtach atound 0030.

## 2018-02-07 NOTE — Progress Notes (Signed)
PROGRESS NOTE    Ras Kollman  AOZ:[id number] DOB: [birth year] DOA: 02/06/2018 PCP: Harvie Junior, MD  Outpatient Specialists:   Brief Narrative: Daisean Brodhead is a 62 y.o. male with medical history significant of throat cancer, history of homelessness, anemia, osteoarthritis, cervical adenopathy, chronic lower back pain, chronic pancreatitis, constipation, chronic dyspnea, GERD, COPD, HTN.  Patient was admitted with possible pneumonia.  Patient has also had recurrent falls.  Magnesium today is 1.4.   Assessment & Plan:   Active Problems:   History of cancer tonsil   HTN (hypertension)   Hypothyroidism   Malnutrition of moderate degree (HCC)   Anemia in chronic illness   COPD with chronic bronchitis (HCC)   CAP (community acquired pneumonia)   Syncope   Hyponatremia   Dehydration   Fall at home, initial encounter   Tobacco abuse   Community acquired pneumonia: -Complete work-up. -Follow results of the work-up already done. -Complete course of antibiotics.  Hypertension: -Continue to hold antihypertensives. -Systolic blood pressures 962 mmHg today.  COPD: -This is stable.  Hyponatremia: -This likely due to volume depletion. -Sodium has gone up to 136 mEq/L today.  Volume depletion: Continue to replete.  Cachexia: -Patient has history of throat cancer. -Adequacy of food intake is not known as well. -According to the patient, he used to weigh about 135 pounds, but weighed 88 pounds on admission.   DVT prophylaxis: Subcutaneous Lovenox. Code Status: Full Family Communication:  Disposition Plan: Home eventually with PT   Consultants:   None  Procedures:   None  Antimicrobials:   IV ceftriaxone  IV Rocephin   Subjective: Patient seen.  Available history noted.  No new complaints according to the patient.  No fever chills, no chest pain, no shortness of breath.  Objective: Vitals:   02/07/18 0100 02/07/18 0551 02/07/18 0739 02/07/18  1423  BP:   102/70   Pulse:   72   Resp:   19   Temp:   98 F (36.7 C)   TempSrc:  Oral Oral   SpO2: 98%  95%   Weight:    40.2 kg (88 lb 9.6 oz)  Height:  5\' 9"  (1.753 m)      Intake/Output Summary (Last 24 hours) at 02/07/2018 1543 Last data filed at 02/07/2018 0100 Gross per 24 hour  Intake 300 ml  Output -  Net 300 ml   Filed Weights   02/07/18 1423  Weight: 40.2 kg (88 lb 9.6 oz)    Examination:  General exam: Cachectic, not in any distress.    Respiratory system: Clear to auscultation. Respiratory effort normal. Cardiovascular system: S1 & S2 heard. No pedal edema. Gastrointestinal system: Abdomen is nondistended, soft and nontender. No organomegaly or masses felt. Normal bowel sounds heard. Central nervous system: Alert and oriented. No focal neurological deficits. Extremities: Symmetric 5 x 5 power.  Data Reviewed: I have personally reviewed following labs and imaging studies  CBC: Recent Labs  Lab 02/06/18 1616 02/07/18 0807  WBC 12.6* 9.2  NEUTROABS 10.8*  --   HGB 12.1* 11.2*  HCT 35.2* 33.3*  MCV 96.2 95.1  PLT 165 952   Basic Metabolic Panel: Recent Labs  Lab 02/06/18 1616 02/06/18 2115 02/07/18 0807  NA 131*  --  136  K 5.3*  --  4.4  CL 88*  --  98*  CO2 31  --  27  GLUCOSE 86  --  79  BUN 32*  --  18  CREATININE 0.80  --  0.49*  CALCIUM 8.9  --  8.4*  MG  --  1.4* 1.9  PHOS  --  3.1 2.6   GFR: Estimated Creatinine Clearance: 55.1 mL/min (A) (by C-G formula based on SCr of 0.49 mg/dL (L)). Liver Function Tests: Recent Labs  Lab 02/06/18 2115 02/07/18 0807  AST 367* 267*  ALT 149* 134*  ALKPHOS 82 74  BILITOT 1.1 0.6  PROT 6.4* 6.0*  ALBUMIN 2.8* 2.7*   No results for input(s): LIPASE, AMYLASE in the last 168 hours. No results for input(s): AMMONIA in the last 168 hours. Coagulation Profile: Recent Labs  Lab 02/07/18 0003 02/07/18 0807  INR 1.40 1.34   Cardiac Enzymes: Recent Labs  Lab 02/06/18 2115 02/07/18 0300  02/07/18 0807  CKTOTAL 3,190*  --   --   TROPONINI 0.08* 0.06* 0.05*   BNP (last 3 results) No results for input(s): PROBNP in the last 8760 hours. HbA1C: No results for input(s): HGBA1C in the last 72 hours. CBG: No results for input(s): GLUCAP in the last 168 hours. Lipid Profile: No results for input(s): CHOL, HDL, LDLCALC, TRIG, CHOLHDL, LDLDIRECT in the last 72 hours. Thyroid Function Tests: Recent Labs    02/07/18 0300  TSH 2.254   Anemia Panel: No results for input(s): VITAMINB12, FOLATE, FERRITIN, TIBC, IRON, RETICCTPCT in the last 72 hours. Urine analysis:    Component Value Date/Time   COLORURINE AMBER (A) 02/06/2018 1616   APPEARANCEUR HAZY (A) 02/06/2018 1616   LABSPEC 1.018 02/06/2018 1616   PHURINE 5.0 02/06/2018 1616   GLUCOSEU NEGATIVE 02/06/2018 1616   HGBUR MODERATE (A) 02/06/2018 1616   BILIRUBINUR NEGATIVE 02/06/2018 1616   KETONESUR NEGATIVE 02/06/2018 1616   PROTEINUR 30 (A) 02/06/2018 1616   UROBILINOGEN 0.2 08/31/2014 2350   NITRITE NEGATIVE 02/06/2018 1616   LEUKOCYTESUR NEGATIVE 02/06/2018 1616   Sepsis Labs: @LABRCNTIP (procalcitonin:4,lacticidven:4)  ) Recent Results (from the past 240 hour(s))  Blood Culture (routine x 2)     Status: None (Preliminary result)   Collection Time: 02/06/18  6:02 PM  Result Value Ref Range Status   Specimen Description BLOOD RIGHT ANTECUBITAL  Final   Special Requests   Final    BOTTLES DRAWN AEROBIC AND ANAEROBIC Blood Culture adequate volume   Culture   Final    NO GROWTH < 24 HOURS Performed at [facility], [place] [address], [place], [place] [address]    Report Status PENDING  Incomplete  Blood Culture (routine x 2)     Status: None (Preliminary result)   Collection Time: 02/06/18  6:30 PM  Result Value Ref Range Status   Specimen Description BLOOD LEFT ANTECUBITAL  Final   Special Requests   Final    BOTTLES DRAWN AEROBIC AND ANAEROBIC Blood Culture adequate volume   Culture   Final    NO  GROWTH < 24 HOURS Performed at [facility], [place] [address], [place], [place] [address]    Report Status PENDING  Incomplete  MRSA PCR Screening     Status: Abnormal   Collection Time: 02/07/18  2:26 AM  Result Value Ref Range Status   MRSA by PCR POSITIVE (A) NEGATIVE Final    Comment:        The GeneXpert MRSA Assay (FDA approved for NASAL specimens only), is one component of a comprehensive MRSA colonization surveillance program. It is not intended to diagnose MRSA infection nor to guide or monitor treatment for MRSA infections. RESULT CALLED TO, READ BACK BY AND VERIFIED WITH: Alyssa Grove  RN 02/07/18 0631 JDW Performed at [facility], [place] [address] [place], [place] [address]          Radiology Studies: Dg Chest 2 View  Result Date: 02/06/2018 CLINICAL DATA:  Hypoxia, history of hypertension, COPD is seizures. Tobacco use since age 54. EXAM: CHEST - 2 VIEW COMPARISON:  07/01/2017 FINDINGS: Emphysematous hyperinflation of the lungs with new right small to moderate pleural effusion. Reverse cephalization and engorgement of pulmonary vasculature to the lower lobes likely on the basis of COPD. Slightly more confluent airspace opacity at the right lung base cannot exclude the possibility of pneumonia. Heart and mediastinal contours are within normal limits. Remote right-sided sixth rib fracture. IMPRESSION: COPD with new small to moderate sized right pleural effusion with reversed cephalization of pulmonary vasculature to the lower lobes likely on the basis of upper lobe COPD. More confluent opacities at the right lung base raise concern for right lower lobe pneumonia and/or atelectasis. Electronically Signed   By: Ashley Royalty M.D.   On: 02/06/2018 17:53   Ct Head Wo Contrast  Result Date: 02/06/2018 CLINICAL DATA:  [place] this afternoon striking head, loss of consciousness, history of tonsillar cancer, COPD, GERD, hypertension, smoker EXAM: CT HEAD WITHOUT CONTRAST CT CERVICAL  SPINE WITHOUT CONTRAST TECHNIQUE: Multidetector CT imaging of the head and cervical spine was performed following the standard protocol without intravenous contrast. Multiplanar CT image reconstructions of the cervical spine were also generated. COMPARISON:  CT soft tissue neck 04/23/2016 FINDINGS: CT HEAD FINDINGS Brain: Normal ventricular morphology. No midline shift or mass effect. Normal appearance of brain parenchyma. No intracranial hemorrhage, mass lesion, or evidence of acute infarction. No extra-axial fluid collections. Vascular: Mild atherosclerotic calcification of internal carotid arteries bilaterally at skull base Skull: Calvaria intact Sinuses/Orbits: Mucosal thickening LEFT maxillary sinus. Remaining visualized paranasal sinuses clear Other: N/A CT CERVICAL SPINE FINDINGS Alignment: Normal Skull base and vertebrae: Osseous mineralization normal. Visualized skull base intact. Vertebral body and disc space heights maintained. No fracture, subluxation or bone destruction. Soft tissues and spinal canal: Prevertebral soft tissues are mildly prominent with fluid versus mucous in the hypopharynx. Scattered atherosclerotic calcifications of the carotid and vertebral arteries. Diffuse infiltration of soft tissue planes in the parapharyngeal region, which may be due to prior radiation therapy or tumor, suboptimally assessed by noncontrast exam, appears more pronounced than on priors contrast-enhanced CT soft tissue neck study. Narrowing of airway at the larynx and supraglottic region. Suboptimal assessment for adenopathy due to loss of tissue planes. Disc levels: Broad-based disc herniation at C6-C7 indenting thecal sac. Upper chest: Lung apices appear emphysematous but clear. Other: N/A IMPRESSION: No acute intracranial abnormalities.1 No acute cervical spine abnormalities. Emphysematous changes at lung apices. Fluid/mucous within the hypopharynx with narrowing of the airway at the level of the larynx and  supraglottic region. Diffuse infiltration of soft tissue planes in the cervical region which may be related to prior radiation therapy for tonsillar cancer though recurrent tumor could cause a similar appearance; recommend follow-up dedicated CT soft tissue neck imaging with contrast when clinical condition permits to further assess. Electronically Signed   By: Lavonia Dana M.D.   On: 02/06/2018 17:35   Ct Angio Chest Pe W Or Wo Contrast  Result Date: 02/07/2018 CLINICAL DATA:  62 year old male with shortness of breath. History of pancreatitis and throat cancer. Weight loss. EXAM: CT ANGIOGRAPHY CHEST CT ABDOMEN AND PELVIS WITH CONTRAST TECHNIQUE: Multidetector CT imaging of the chest was performed using the standard protocol during bolus  administration of intravenous contrast. Multiplanar CT image reconstructions and MIPs were obtained to evaluate the vascular anatomy. Multidetector CT imaging of the abdomen and pelvis was performed using the standard protocol during bolus administration of intravenous contrast. CONTRAST:  60mL ISOVUE-370 IOPAMIDOL (ISOVUE-370) INJECTION 76% COMPARISON:  CT of the abdomen pelvis dated 07/11/2016 and radiograph dated 03/10/2017 FINDINGS: Evaluation is limited due to streak artifact caused by patient's arms and overlying metallic support device. Evaluation is also limited due to anasarca and cachexia. CTA CHEST FINDINGS Cardiovascular: There is mild cardiomegaly. No pericardial effusion. There is coronary vascular calcification primarily involving the LAD. The aorta is poorly visualized and suboptimally opacified but grossly unremarkable. Evaluation of the pulmonary arteries is limited due to suboptimal enhancement of the peripheral branches. Apparent areas of decreased enhancement in the periphery of the distal branches of the right lower lobe (series 9 images 186-191) likely artifactual and related to volume averaging artifact with adjacent bronchi as well as poor contrast  opacification. No definite CT evidence of pulmonary embolism. Mediastinum/Nodes: There is fullness of the right hilum measuring 15 mm in short axis. Ill-defined soft tissue density extending from the hila along the bronchi are not well evaluated but likely thickened bronchial wall with endobronchial debris. Evaluation of the mediastinum and hila is very limited. No mediastinal fluid collection. The esophagus is not well visualized. Lungs/Pleura: There are extensive consolidative changes of the lung bases and lower lobes progressed compared to the prior CT. There is diffuse areas of nodularity is at the lung bases. Diffuse thickening of the bronchial walls with mucous impaction involving the lower lobes. There is mucous impaction of the right lower lumbar bronchus. There is narrowing of the central bronchi likely secondary to edema. There is a small to moderate right and trace left pleural effusion. There is a background of emphysema. Minimal amount of air in the left apical pleural surface noted, appears chronic and seen on the CT of the cervical spine dated 02/06/2018. No pneumothorax. Musculoskeletal: Diffuse soft tissue edema and anasarca. There is loss of subcutaneous fat and cachexia. No acute osseous pathology. Extensive right chest wall venous collaterals possibly related to a degree of SVC occlusion. Review of the MIP images confirms the above findings. CT ABDOMEN and PELVIS FINDINGS No intra-abdominal free air or free fluid. Hepatobiliary: The liver is grossly unremarkable. No intrahepatic biliary ductal dilatation. Cholecystectomy. Pancreas: Changes of chronic pancreatitis with dystrophic calcification and dilatation of the main pancreatic duct. Evaluation of the pancreas is limited due to suboptimal visualization. Spleen: Normal in size without focal abnormality. Adrenals/Urinary Tract: The adrenal glands are unremarkable. Small right renal cyst. The kidneys, kidneys and urinary bladder appear  unremarkable. Stomach/Bowel: Oral contrast mixed with stool noted throughout the colon. There is no bowel dilatation or evidence of obstruction. The appendix is not identified with certainty. Vascular/Lymphatic: Extensive aortoiliac atherosclerotic disease. No portal venous gas. Evaluation for adenopathy is limited due to diffuse edema and anasarca as well as paucity of abdominal fat. Reproductive: The prostate and seminal vesicles are grossly unremarkable. Other: Diffuse edema and anasarca as well as cachexia. Musculoskeletal: No acute osseous pathology.  Osteopenia. Review of the MIP images confirms the above findings. IMPRESSION: 1. No definite CT evidence of pulmonary embolism. 2. Diffuse thickening of the bronchial wall with endobronchial mucous content predominantly involving the lower lobes. Large areas of airspace opacity involving the lower lobes and lung bases bilaterally, right greater left most consistent with pneumonia possibly related to aspiration. Clinical correlation is recommended. Bilateral pleural  effusions, right greater left. 3. No definite acute intra-abdominal or pelvic pathology. 4. Diffuse soft tissue edema and anasarca.  Cachexia. Electronically Signed   By: Anner Crete M.D.   On: 02/07/2018 02:40   Ct Cervical Spine Wo Contrast  Result Date: 02/06/2018 CLINICAL DATA:  [place] this afternoon striking head, loss of consciousness, history of tonsillar cancer, COPD, GERD, hypertension, smoker EXAM: CT HEAD WITHOUT CONTRAST CT CERVICAL SPINE WITHOUT CONTRAST TECHNIQUE: Multidetector CT imaging of the head and cervical spine was performed following the standard protocol without intravenous contrast. Multiplanar CT image reconstructions of the cervical spine were also generated. COMPARISON:  CT soft tissue neck 04/23/2016 FINDINGS: CT HEAD FINDINGS Brain: Normal ventricular morphology. No midline shift or mass effect. Normal appearance of brain parenchyma. No intracranial hemorrhage, mass  lesion, or evidence of acute infarction. No extra-axial fluid collections. Vascular: Mild atherosclerotic calcification of internal carotid arteries bilaterally at skull base Skull: Calvaria intact Sinuses/Orbits: Mucosal thickening LEFT maxillary sinus. Remaining visualized paranasal sinuses clear Other: N/A CT CERVICAL SPINE FINDINGS Alignment: Normal Skull base and vertebrae: Osseous mineralization normal. Visualized skull base intact. Vertebral body and disc space heights maintained. No fracture, subluxation or bone destruction. Soft tissues and spinal canal: Prevertebral soft tissues are mildly prominent with fluid versus mucous in the hypopharynx. Scattered atherosclerotic calcifications of the carotid and vertebral arteries. Diffuse infiltration of soft tissue planes in the parapharyngeal region, which may be due to prior radiation therapy or tumor, suboptimally assessed by noncontrast exam, appears more pronounced than on priors contrast-enhanced CT soft tissue neck study. Narrowing of airway at the larynx and supraglottic region. Suboptimal assessment for adenopathy due to loss of tissue planes. Disc levels: Broad-based disc herniation at C6-C7 indenting thecal sac. Upper chest: Lung apices appear emphysematous but clear. Other: N/A IMPRESSION: No acute intracranial abnormalities.1 No acute cervical spine abnormalities. Emphysematous changes at lung apices. Fluid/mucous within the hypopharynx with narrowing of the airway at the level of the larynx and supraglottic region. Diffuse infiltration of soft tissue planes in the cervical region which may be related to prior radiation therapy for tonsillar cancer though recurrent tumor could cause a similar appearance; recommend follow-up dedicated CT soft tissue neck imaging with contrast when clinical condition permits to further assess. Electronically Signed   By: Lavonia Dana M.D.   On: 02/06/2018 17:35   Ct Abdomen Pelvis W Contrast  Result Date:  02/07/2018 CLINICAL DATA:  62 year old male with shortness of breath. History of pancreatitis and throat cancer. Weight loss. EXAM: CT ANGIOGRAPHY CHEST CT ABDOMEN AND PELVIS WITH CONTRAST TECHNIQUE: Multidetector CT imaging of the chest was performed using the standard protocol during bolus administration of intravenous contrast. Multiplanar CT image reconstructions and MIPs were obtained to evaluate the vascular anatomy. Multidetector CT imaging of the abdomen and pelvis was performed using the standard protocol during bolus administration of intravenous contrast. CONTRAST:  73mL ISOVUE-370 IOPAMIDOL (ISOVUE-370) INJECTION 76% COMPARISON:  CT of the abdomen pelvis dated 07/11/2016 and radiograph dated 03/10/2017 FINDINGS: Evaluation is limited due to streak artifact caused by patient's arms and overlying metallic support device. Evaluation is also limited due to anasarca and cachexia. CTA CHEST FINDINGS Cardiovascular: There is mild cardiomegaly. No pericardial effusion. There is coronary vascular calcification primarily involving the LAD. The aorta is poorly visualized and suboptimally opacified but grossly unremarkable. Evaluation of the pulmonary arteries is limited due to suboptimal enhancement of the peripheral branches. Apparent areas of decreased enhancement in the periphery of the distal branches of the right lower  lobe (series 9 images 186-191) likely artifactual and related to volume averaging artifact with adjacent bronchi as well as poor contrast opacification. No definite CT evidence of pulmonary embolism. Mediastinum/Nodes: There is fullness of the right hilum measuring 15 mm in short axis. Ill-defined soft tissue density extending from the hila along the bronchi are not well evaluated but likely thickened bronchial wall with endobronchial debris. Evaluation of the mediastinum and hila is very limited. No mediastinal fluid collection. The esophagus is not well visualized. Lungs/Pleura: There are  extensive consolidative changes of the lung bases and lower lobes progressed compared to the prior CT. There is diffuse areas of nodularity is at the lung bases. Diffuse thickening of the bronchial walls with mucous impaction involving the lower lobes. There is mucous impaction of the right lower lumbar bronchus. There is narrowing of the central bronchi likely secondary to edema. There is a small to moderate right and trace left pleural effusion. There is a background of emphysema. Minimal amount of air in the left apical pleural surface noted, appears chronic and seen on the CT of the cervical spine dated 02/06/2018. No pneumothorax. Musculoskeletal: Diffuse soft tissue edema and anasarca. There is loss of subcutaneous fat and cachexia. No acute osseous pathology. Extensive right chest wall venous collaterals possibly related to a degree of SVC occlusion. Review of the MIP images confirms the above findings. CT ABDOMEN and PELVIS FINDINGS No intra-abdominal free air or free fluid. Hepatobiliary: The liver is grossly unremarkable. No intrahepatic biliary ductal dilatation. Cholecystectomy. Pancreas: Changes of chronic pancreatitis with dystrophic calcification and dilatation of the main pancreatic duct. Evaluation of the pancreas is limited due to suboptimal visualization. Spleen: Normal in size without focal abnormality. Adrenals/Urinary Tract: The adrenal glands are unremarkable. Small right renal cyst. The kidneys, kidneys and urinary bladder appear unremarkable. Stomach/Bowel: Oral contrast mixed with stool noted throughout the colon. There is no bowel dilatation or evidence of obstruction. The appendix is not identified with certainty. Vascular/Lymphatic: Extensive aortoiliac atherosclerotic disease. No portal venous gas. Evaluation for adenopathy is limited due to diffuse edema and anasarca as well as paucity of abdominal fat. Reproductive: The prostate and seminal vesicles are grossly unremarkable. Other:  Diffuse edema and anasarca as well as cachexia. Musculoskeletal: No acute osseous pathology.  Osteopenia. Review of the MIP images confirms the above findings. IMPRESSION: 1. No definite CT evidence of pulmonary embolism. 2. Diffuse thickening of the bronchial wall with endobronchial mucous content predominantly involving the lower lobes. Large areas of airspace opacity involving the lower lobes and lung bases bilaterally, right greater left most consistent with pneumonia possibly related to aspiration. Clinical correlation is recommended. Bilateral pleural effusions, right greater left. 3. No definite acute intra-abdominal or pelvic pathology. 4. Diffuse soft tissue edema and anasarca.  Cachexia. Electronically Signed   By: Anner Crete M.D.   On: 02/07/2018 02:40        Scheduled Meds: . azithromycin  500 mg Oral Daily  . Chlorhexidine Gluconate Cloth  6 each Topical Q0600  . enoxaparin (LOVENOX) injection  30 mg Subcutaneous Q24H  . feeding supplement (ENSURE ENLIVE)  237 mL Oral TID BM  . folic acid  1 mg Oral Daily  . guaiFENesin  600 mg Oral BID  . lipase/protease/amylase  12,000 Units Oral TID AC & HS  . mouth rinse  15 mL Mouth Rinse BID  . mirtazapine  30 mg Oral QHS  . multivitamin with minerals  1 tablet Oral Daily  . mupirocin ointment  1 application  Nasal BID  . pantoprazole  20 mg Oral BID  . thiamine  100 mg Oral Daily   Continuous Infusions: . cefTRIAXone (ROCEPHIN)  IV       LOS: 1 day    Time spent: 35 minutes    Dana Allan, MD  [facility] Pager #: [phone] 7PM-7AM contact night coverage as above

## 2018-02-08 ENCOUNTER — Inpatient Hospital Stay (HOSPITAL_COMMUNITY): Payer: Medicaid Other

## 2018-02-08 DIAGNOSIS — L8990 Pressure ulcer of unspecified site, unspecified stage: Secondary | ICD-10-CM

## 2018-02-08 LAB — EXPECTORATED SPUTUM ASSESSMENT W GRAM STAIN, RFLX TO RESP C: Special Requests: NORMAL

## 2018-02-08 LAB — EXPECTORATED SPUTUM ASSESSMENT W REFEX TO RESP CULTURE

## 2018-02-08 MED ORDER — RESOURCE THICKENUP CLEAR PO POWD
ORAL | Status: DC | PRN
  Filled 2018-02-08: qty 125

## 2018-02-08 NOTE — Evaluation (Signed)
Clinical/Bedside Swallow Evaluation Patient Details  Name: Russell Williamson MRN: [id number] Date of Birth: 1956/06/06  Today's Date: 02/08/2018 Time: SLP Start Time (ACUTE ONLY): 1002 SLP Stop Time (ACUTE ONLY): 1017 SLP Time Calculation (min) (ACUTE ONLY): 15 min  Past Medical History:  Past Medical History:  Diagnosis Date  . Anemia   . Arthritis   . Cervical adenopathy 04/26/2012  . Chronic back pain    lower  . Chronic pancreatitis ([place])   . Constipation   . COPD (chronic obstructive pulmonary disease) ([place])   . Daily headache 05/23/2012   "small ones"  . Deficiency anemia 04/15/2016  . Dyspnea    with exertion  . G tube feedings ([facility])    hx of  feeding tube in stomach  . GERD (gastroesophageal reflux disease)   . History of blood transfusion ~ 2004   "from the pancreatitis"  . History of radiation therapy 02/02/2011-03/22/2011   head/neck,left tonsil  . Hx of radiation therapy 02/02/11 to 03/22/11   L tonsil  . Hypertension   . Insomnia   . 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 Pancytopenia ([place]) 02/28/2014  . Poor venous access 04/28/2016  . 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 Thrush 11/02/2013  . 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 Urinary hesitancy    Past Surgical History:  Past Surgical History:  Procedure Laterality Date  . BILE DUCT STENT PLACEMENT     hx of  . CHOLECYSTECTOMY  04/2005  . DIRECT LARYNGOSCOPY  05/23/2012   Procedure: DIRECT LARYNGOSCOPY;  Surgeon: Rozetta Nunnery, MD;  Location: [place];  Service: ENT;  Laterality: N/A;  . DIRECT LARYNGOSCOPY N/A 03/23/2013   Procedure: DIRECT LARYNGOSCOPY;  Surgeon: Rozetta Nunnery, MD;  Location: [place];  Service: ENT;  Laterality: N/A;  . ESOPHAGEAL DILATION N/A 03/23/2013   Procedure: ESOPHAGEAL DILATION;  Surgeon: Rozetta Nunnery, MD;  Location: [place];  Service: ENT;  Laterality: N/A;  .  ESOPHAGOGASTRODUODENOSCOPY (EGD) WITH PROPOFOL N/A 03/01/2017   Procedure: ESOPHAGOGASTRODUODENOSCOPY (EGD) WITH PROPOFOL;  Surgeon: Ladene Artist, MD;  Location: [facility];  Service: Endoscopy;  Laterality: N/A;  . FLEXIBLE SIGMOIDOSCOPY N/A 03/01/2017   Procedure: FLEXIBLE SIGMOIDOSCOPY;  Surgeon: Ladene Artist, MD;  Location: [facility];  Service: Endoscopy;  Laterality: N/A;  . IR GENERIC HISTORICAL  04/29/2016   IR US GUIDE VASC ACCESS RIGHT 04/29/2016 [facility]  . IR GENERIC HISTORICAL  04/29/2016   IR FLUORO GUIDE CV LINE RIGHT 04/29/2016 [facility]  . IR GENERIC HISTORICAL  05/12/2016   IR US GUIDE VASC ACCESS LEFT 05/12/2016 [facility]  . IR GENERIC HISTORICAL  05/12/2016   IR FLUORO GUIDE CV LINE LEFT 05/12/2016 [facility]  . IR GENERIC HISTORICAL  06/28/2016   IR US GUIDE VASC ACCESS RIGHT 06/28/2016 Sandi Mariscal, MD [facility]  . IR GENERIC HISTORICAL  06/28/2016   IR FLUORO GUIDE PORT INSERTION RIGHT 06/28/2016 Sandi Mariscal, MD [facility]  . IR REMOVAL TUN ACCESS W/ PORT W/O FL MOD SED  02/10/2017  . MASS BIOPSY  05/23/2012   Procedure: NECK MASS BIOPSY;  Surgeon: Rozetta Nunnery, MD;  Location: [place];  Service: ENT;  Laterality: N/A;  . RADICAL NECK DISSECTION  05/23/2012   w/mass excision  . RADICAL NECK DISSECTION  05/23/2012   Procedure: RADICAL NECK DISSECTION;  Surgeon: Rozetta Nunnery, MD;  Location: [place];  Service: ENT;  Laterality: Left;  . TIBIA FRACTURE SURGERY  1990's   left   HPI:  Pt is a 62 y.o. male admitted s/p fall (questionable LOC); CXR showed RLL PNA, CT Head negative for acute changes. 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     Assessment / Plan / Recommendation Clinical Impression  Pt describes more chronic difficulty swallowing since tx for throat cancer several years ago (dx in 2013), having to puree all his food at home to get it to "go down." He is not sure if he has ever had a swallow study done, but he denies any prior hx of PNA. He appears to have some hyolaryngeal movement to palpation, although throat clearing follows trials of thin liquids. This is reduced but not completely eliminated with straw removal. Recommend adjusting diet to Dys 1 textures and thin liquids pending completion of MBS to better assess oropharyngeal function. SLP Visit Diagnosis: Dysphagia, unspecified (R13.10)    Aspiration Risk  Moderate aspiration risk;Risk for inadequate nutrition/hydration    Diet Recommendation Dysphagia 1 (Puree);Thin liquid   Liquid Administration via: Cup;No straw Medication Administration: Crushed with puree Supervision: Patient able to self feed;Intermittent supervision to cue for compensatory strategies Compensations: Slow rate;Small sips/bites Postural Changes: Seated upright at 90 degrees;Remain upright for at least 30 minutes after po intake    Other  Recommendations Oral Care Recommendations: Oral care BID   Follow up Recommendations        Frequency and Duration            Prognosis Prognosis for Safe Diet Advancement: Guarded Barriers to Reach Goals: Time post onset      Swallow Study   General HPI: Pt is a 62 y.o. male admitted s/p fall (questionable LOC); CXR showed RLL PNA, CT Head negative for acute changes. 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   Type of  Study: Bedside Swallow Evaluation Previous Swallow Assessment: none in chart Diet Prior to this Study: Regular;Thin liquids Temperature Spikes Noted: No Respiratory Status: Nasal cannula History of Recent Intubation: No Behavior/Cognition: Alert;Cooperative Oral Cavity Assessment: Within Functional Limits Oral Care Completed by SLP: No Oral Cavity - Dentition: Edentulous Vision: Functional for self-feeding Self-Feeding Abilities: Able to feed self Patient Positioning: Upright in bed Baseline Vocal Quality: Other (comment)(rough; pt says stable) Volitional Cough: Strong Volitional Swallow: Able to elicit    Oral/Motor/Sensory Function Overall Oral Motor/Sensory Function: Within functional limits   Ice Chips Ice chips: Not tested   Thin Liquid Thin Liquid: Impaired Presentation: Cup;Self Fed;Straw Pharyngeal  Phase Impairments: Decreased hyoid-laryngeal movement;Throat Clearing - Immediate    Nectar Thick Nectar Thick Liquid: Not tested   Honey Thick Honey Thick Liquid: Not tested   Puree Puree: Impaired Presentation: Self Fed;Spoon Pharyngeal Phase Impairments: Decreased hyoid-laryngeal movement   Solid   GO   Solid: Not tested        [name] 02/08/2018,10:35 AM  [name], M.A. CCC-SLP [phone]

## 2018-02-08 NOTE — Progress Notes (Signed)
OT Cancellation Note  Patient Details Name: Russell Williamson MRN: [id number] DOB: 1955-11-20   Cancelled Treatment:    Reason Eval/Treat Not Completed: Patient at procedure or test/ unavailable(Will follow.)  Malka So 02/08/2018, 1:45 PM  02/08/2018 Nestor Lewandowsky, OTR/L Pager: [phone]

## 2018-02-08 NOTE — Progress Notes (Signed)
PROGRESS NOTE    Russell Williamson  JKD:[id number] DOB: 01-21-1956 DOA: 02/06/2018 PCP: Harvie Junior, MD  Outpatient Specialists:   Brief Narrative: Russell Williamson is a 62 y.o. male with medical history significant of throat cancer, history of homelessness, anemia, osteoarthritis, cervical adenopathy, chronic lower back pain, chronic pancreatitis, constipation, chronic dyspnea, GERD, COPD, HTN.  Patient was admitted with possible pneumonia.  Patient has also had recurrent falls.  Magnesium today is 1.4.  02/08/2018: Patient seen.  No new complaints.  No fever or chills.  Concerns for aspiration persists.  Speech assessment input is highly appreciated.  CT scan chest finding is noted.  Will continue antibiotics for now.  Assessment & Plan:   Active Problems:   History of cancer tonsil   HTN (hypertension)   Hypothyroidism   Malnutrition of moderate degree (HCC)   Anemia in chronic illness   COPD with chronic bronchitis (HCC)   CAP (community acquired pneumonia)   Syncope   Hyponatremia   Dehydration   Fall at home, initial encounter   Tobacco abuse   Pressure injury of skin   Community acquired pneumonia: -Complete work-up. -Follow results of the work-up already done. -Complete course of antibiotics.  Hypertension: -Continue to hold antihypertensives. -Systolic blood pressures 099 mmHg today.  COPD: -This is stable.  Hyponatremia: -This likely due to volume depletion. -Sodium has gone up to 136 mEq/L today.  Volume depletion: Continue to replete.  Cachexia: -Patient has history of throat cancer. -Adequacy of food intake is not known as well. -According to the patient, he used to weigh about 135 pounds, but weighed 88 pounds on admission.   DVT prophylaxis: Subcutaneous Lovenox. Code Status: Full Family Communication:  Disposition Plan: Home eventually with PT   Consultants:   None  Procedures:   None  Antimicrobials:   IV ceftriaxone  IV  Rocephin   Subjective: Patient seen.  No new complaints.  Patient continues to have productive cough.  Objective: Vitals:   02/07/18 1629 02/07/18 2300 02/08/18 0811 02/08/18 0811  BP: 110/68 (!) 106/55 115/79 115/79  Pulse: 78 72 72 72  Resp: 18 18 18 18   Temp: 98.2 F (36.8 C) 98.1 F (36.7 C) 98.4 F (36.9 C) 98.4 F (36.9 C)  TempSrc: Oral Oral Oral Oral  SpO2: 96% 98% 96% 96%  Weight:      Height:        Intake/Output Summary (Last 24 hours) at 02/08/2018 1606 Last data filed at 02/08/2018 0900 Gross per 24 hour  Intake 460 ml  Output -  Net 460 ml   Filed Weights   02/07/18 1423  Weight: 40.2 kg (88 lb 9.6 oz)    Examination:  General exam: Cachectic, not in any distress.    Respiratory system: Clear to auscultation. Respiratory effort normal. Cardiovascular system: S1 & S2 heard. No pedal edema. Gastrointestinal system: Abdomen is nondistended, soft and nontender. No organomegaly or masses felt. Normal bowel sounds heard. Central nervous system: Alert and oriented. No focal neurological deficits. Extremities: Symmetric 5 x 5 power.  Data Reviewed: I have personally reviewed following labs and imaging studies  CBC: Recent Labs  Lab 02/06/18 1616 02/07/18 0807  WBC 12.6* 9.2  NEUTROABS 10.8*  --   HGB 12.1* 11.2*  HCT 35.2* 33.3*  MCV 96.2 95.1  PLT 165 833   Basic Metabolic Panel: Recent Labs  Lab 02/06/18 1616 02/06/18 2115 02/07/18 0807  NA 131*  --  136  K 5.3*  --  4.4  CL  88*  --  98*  CO2 31  --  27  GLUCOSE 86  --  79  BUN 32*  --  18  CREATININE 0.80  --  0.49*  CALCIUM 8.9  --  8.4*  MG  --  1.4* 1.9  PHOS  --  3.1 2.6   GFR: Estimated Creatinine Clearance: 55.1 mL/min (A) (by C-G formula based on SCr of 0.49 mg/dL (L)). Liver Function Tests: Recent Labs  Lab 02/06/18 2115 02/07/18 0807  AST 367* 267*  ALT 149* 134*  ALKPHOS 82 74  BILITOT 1.1 0.6  PROT 6.4* 6.0*  ALBUMIN 2.8* 2.7*   No results for input(s): LIPASE,  AMYLASE in the last 168 hours. No results for input(s): AMMONIA in the last 168 hours. Coagulation Profile: Recent Labs  Lab 02/07/18 0003 02/07/18 0807  INR 1.40 1.34   Cardiac Enzymes: Recent Labs  Lab 02/06/18 2115 02/07/18 0300 02/07/18 0807  CKTOTAL 3,190*  --   --   TROPONINI 0.08* 0.06* 0.05*   BNP (last 3 results) No results for input(s): PROBNP in the last 8760 hours. HbA1C: No results for input(s): HGBA1C in the last 72 hours. CBG: No results for input(s): GLUCAP in the last 168 hours. Lipid Profile: No results for input(s): CHOL, HDL, LDLCALC, TRIG, CHOLHDL, LDLDIRECT in the last 72 hours. Thyroid Function Tests: Recent Labs    02/07/18 0300  TSH 2.254   Anemia Panel: No results for input(s): VITAMINB12, FOLATE, FERRITIN, TIBC, IRON, RETICCTPCT in the last 72 hours. Urine analysis:    Component Value Date/Time   COLORURINE AMBER (A) 02/06/2018 1616   APPEARANCEUR HAZY (A) 02/06/2018 1616   LABSPEC 1.018 02/06/2018 1616   PHURINE 5.0 02/06/2018 1616   GLUCOSEU NEGATIVE 02/06/2018 1616   HGBUR MODERATE (A) 02/06/2018 1616   BILIRUBINUR NEGATIVE 02/06/2018 1616   KETONESUR NEGATIVE 02/06/2018 1616   PROTEINUR 30 (A) 02/06/2018 1616   UROBILINOGEN 0.2 08/31/2014 2350   NITRITE NEGATIVE 02/06/2018 1616   LEUKOCYTESUR NEGATIVE 02/06/2018 1616   Sepsis Labs: @LABRCNTIP (procalcitonin:4,lacticidven:4)  ) Recent Results (from the past 240 hour(s))  Blood Culture (routine x 2)     Status: None (Preliminary result)   Collection Time: 02/06/18  6:02 PM  Result Value Ref Range Status   Specimen Description BLOOD RIGHT ANTECUBITAL  Final   Special Requests   Final    BOTTLES DRAWN AEROBIC AND ANAEROBIC Blood Culture adequate volume   Culture   Final    NO GROWTH 2 DAYS Performed at [facility], [place] [address] [place], [place] [address]    Report Status PENDING  Incomplete  Blood Culture (routine x 2)     Status: None (Preliminary result)    Collection Time: 02/06/18  6:30 PM  Result Value Ref Range Status   Specimen Description BLOOD LEFT ANTECUBITAL  Final   Special Requests   Final    BOTTLES DRAWN AEROBIC AND ANAEROBIC Blood Culture adequate volume   Culture   Final    NO GROWTH 2 DAYS Performed at [facility], [place] [address], [place], [place] [address]    Report Status PENDING  Incomplete  MRSA PCR Screening     Status: Abnormal   Collection Time: 02/07/18  2:26 AM  Result Value Ref Range Status   MRSA by PCR POSITIVE (A) NEGATIVE Final    Comment:        The GeneXpert MRSA Assay (FDA approved for NASAL specimens only), is one component of a comprehensive  MRSA colonization surveillance program. It is not intended to diagnose MRSA infection nor to guide or monitor treatment for MRSA infections. RESULT CALLED TO, READ BACK BY AND VERIFIED WITH: Alyssa Grove RN 02/07/18 0631 JDW Performed at [facility], [address], [place], [place] [address]   Culture, sputum-assessment     Status: None   Collection Time: 02/07/18  5:42 PM  Result Value Ref Range Status   Specimen Description EXPECTORATED SPUTUM  Final   Special Requests Normal  Final   Sputum evaluation   Final    THIS SPECIMEN IS ACCEPTABLE FOR SPUTUM CULTURE Performed at [facility], [address] [place], [place] [address]    Report Status 02/08/2018 FINAL  Final  Culture, respiratory (NON-Expectorated)     Status: None (Preliminary result)   Collection Time: 02/07/18  5:42 PM  Result Value Ref Range Status   Specimen Description EXPECTORATED SPUTUM  Final   Special Requests Normal Reflexed from [id number]  Final   Gram Stain   Final    ABUNDANT WBC PRESENT,BOTH PMN AND MONONUCLEAR MODERATE GRAM POSITIVE COCCI RARE YEAST FEW SQUAMOUS EPITHELIAL CELLS PRESENT    Culture   Final    TOO YOUNG TO READ Performed at [facility], [place] [address], [place],  [address]    Report Status PENDING  Incomplete          Radiology Studies: Dg Chest 2 View  Result Date: 02/06/2018 CLINICAL DATA:  Hypoxia, history of hypertension, COPD is seizures. Tobacco use since age 63. EXAM: CHEST - 2 VIEW COMPARISON:  07/01/2017 FINDINGS: Emphysematous hyperinflation of the lungs with new right small to moderate pleural effusion. Reverse cephalization and engorgement of pulmonary vasculature to the lower lobes likely on the basis of COPD. Slightly more confluent airspace opacity at the right lung base cannot exclude the possibility of pneumonia. Heart and mediastinal contours are within normal limits. Remote right-sided sixth rib fracture. IMPRESSION: COPD with new small to moderate sized right pleural effusion with reversed cephalization of pulmonary vasculature to the lower lobes likely on the basis of upper lobe COPD. More confluent opacities at the right lung base raise concern for right lower lobe pneumonia and/or atelectasis. Electronically Signed   By: Ashley Royalty M.D.   On: 02/06/2018 17:53   Ct Head Wo Contrast  Result Date: 02/06/2018 CLINICAL DATA:  [place] this afternoon striking head, loss of consciousness, history of tonsillar cancer, COPD, GERD, hypertension, smoker EXAM: CT HEAD WITHOUT CONTRAST CT CERVICAL SPINE WITHOUT CONTRAST TECHNIQUE: Multidetector CT imaging of the head and cervical spine was performed following the standard protocol without intravenous contrast. Multiplanar CT image reconstructions of the cervical spine were also generated. COMPARISON:  CT soft tissue neck 04/23/2016 FINDINGS: CT HEAD FINDINGS Brain: Normal ventricular morphology. No midline shift or mass effect. Normal appearance of brain parenchyma. No intracranial hemorrhage, mass lesion, or evidence of acute infarction. No extra-axial fluid collections. Vascular: Mild atherosclerotic calcification of internal carotid arteries bilaterally at skull base Skull: Calvaria intact Sinuses/Orbits: Mucosal thickening LEFT maxillary sinus.  Remaining visualized paranasal sinuses clear Other: N/A CT CERVICAL SPINE FINDINGS Alignment: Normal Skull base and vertebrae: Osseous mineralization normal. Visualized skull base intact. Vertebral body and disc space heights maintained. No fracture, subluxation or bone destruction. Soft tissues and spinal canal: Prevertebral soft tissues are mildly prominent with fluid versus mucous in the hypopharynx. Scattered atherosclerotic calcifications of the carotid and vertebral arteries. Diffuse infiltration of soft tissue planes in the parapharyngeal region,  which may be due to prior radiation therapy or tumor, suboptimally assessed by noncontrast exam, appears more pronounced than on priors contrast-enhanced CT soft tissue neck study. Narrowing of airway at the larynx and supraglottic region. Suboptimal assessment for adenopathy due to loss of tissue planes. Disc levels: Broad-based disc herniation at C6-C7 indenting thecal sac. Upper chest: Lung apices appear emphysematous but clear. Other: N/A IMPRESSION: No acute intracranial abnormalities.1 No acute cervical spine abnormalities. Emphysematous changes at lung apices. Fluid/mucous within the hypopharynx with narrowing of the airway at the level of the larynx and supraglottic region. Diffuse infiltration of soft tissue planes in the cervical region which may be related to prior radiation therapy for tonsillar cancer though recurrent tumor could cause a similar appearance; recommend follow-up dedicated CT soft tissue neck imaging with contrast when clinical condition permits to further assess. Electronically Signed   By: Lavonia Dana M.D.   On: 02/06/2018 17:35   Ct Angio Chest Pe W Or Wo Contrast  Result Date: 02/07/2018 CLINICAL DATA:  62 year old male with shortness of breath. History of pancreatitis and throat cancer. Weight loss. EXAM: CT ANGIOGRAPHY CHEST CT ABDOMEN AND PELVIS WITH CONTRAST TECHNIQUE: Multidetector CT imaging of the chest was performed using the  standard protocol during bolus administration of intravenous contrast. Multiplanar CT image reconstructions and MIPs were obtained to evaluate the vascular anatomy. Multidetector CT imaging of the abdomen and pelvis was performed using the standard protocol during bolus administration of intravenous contrast. CONTRAST:  93mL ISOVUE-370 IOPAMIDOL (ISOVUE-370) INJECTION 76% COMPARISON:  CT of the abdomen pelvis dated 07/11/2016 and radiograph dated 03/10/2017 FINDINGS: Evaluation is limited due to streak artifact caused by patient's arms and overlying metallic support device. Evaluation is also limited due to anasarca and cachexia. CTA CHEST FINDINGS Cardiovascular: There is mild cardiomegaly. No pericardial effusion. There is coronary vascular calcification primarily involving the LAD. The aorta is poorly visualized and suboptimally opacified but grossly unremarkable. Evaluation of the pulmonary arteries is limited due to suboptimal enhancement of the peripheral branches. Apparent areas of decreased enhancement in the periphery of the distal branches of the right lower lobe (series 9 images 186-191) likely artifactual and related to volume averaging artifact with adjacent bronchi as well as poor contrast opacification. No definite CT evidence of pulmonary embolism. Mediastinum/Nodes: There is fullness of the right hilum measuring 15 mm in short axis. Ill-defined soft tissue density extending from the hila along the bronchi are not well evaluated but likely thickened bronchial wall with endobronchial debris. Evaluation of the mediastinum and hila is very limited. No mediastinal fluid collection. The esophagus is not well visualized. Lungs/Pleura: There are extensive consolidative changes of the lung bases and lower lobes progressed compared to the prior CT. There is diffuse areas of nodularity is at the lung bases. Diffuse thickening of the bronchial walls with mucous impaction involving the lower lobes. There is  mucous impaction of the right lower lumbar bronchus. There is narrowing of the central bronchi likely secondary to edema. There is a small to moderate right and trace left pleural effusion. There is a background of emphysema. Minimal amount of air in the left apical pleural surface noted, appears chronic and seen on the CT of the cervical spine dated 02/06/2018. No pneumothorax. Musculoskeletal: Diffuse soft tissue edema and anasarca. There is loss of subcutaneous fat and cachexia. No acute osseous pathology. Extensive right chest wall venous collaterals possibly related to a degree of SVC occlusion. Review of the MIP images confirms the above findings. CT ABDOMEN  and PELVIS FINDINGS No intra-abdominal free air or free fluid. Hepatobiliary: The liver is grossly unremarkable. No intrahepatic biliary ductal dilatation. Cholecystectomy. Pancreas: Changes of chronic pancreatitis with dystrophic calcification and dilatation of the main pancreatic duct. Evaluation of the pancreas is limited due to suboptimal visualization. Spleen: Normal in size without focal abnormality. Adrenals/Urinary Tract: The adrenal glands are unremarkable. Small right renal cyst. The kidneys, kidneys and urinary bladder appear unremarkable. Stomach/Bowel: Oral contrast mixed with stool noted throughout the colon. There is no bowel dilatation or evidence of obstruction. The appendix is not identified with certainty. Vascular/Lymphatic: Extensive aortoiliac atherosclerotic disease. No portal venous gas. Evaluation for adenopathy is limited due to diffuse edema and anasarca as well as paucity of abdominal fat. Reproductive: The prostate and seminal vesicles are grossly unremarkable. Other: Diffuse edema and anasarca as well as cachexia. Musculoskeletal: No acute osseous pathology.  Osteopenia. Review of the MIP images confirms the above findings. IMPRESSION: 1. No definite CT evidence of pulmonary embolism. 2. Diffuse thickening of the bronchial  wall with endobronchial mucous content predominantly involving the lower lobes. Large areas of airspace opacity involving the lower lobes and lung bases bilaterally, right greater left most consistent with pneumonia possibly related to aspiration. Clinical correlation is recommended. Bilateral pleural effusions, right greater left. 3. No definite acute intra-abdominal or pelvic pathology. 4. Diffuse soft tissue edema and anasarca.  Cachexia. Electronically Signed   By: Anner Crete M.D.   On: 02/07/2018 02:40   Ct Cervical Spine Wo Contrast  Result Date: 02/06/2018 CLINICAL DATA:  [place] this afternoon striking head, loss of consciousness, history of tonsillar cancer, COPD, GERD, hypertension, smoker EXAM: CT HEAD WITHOUT CONTRAST CT CERVICAL SPINE WITHOUT CONTRAST TECHNIQUE: Multidetector CT imaging of the head and cervical spine was performed following the standard protocol without intravenous contrast. Multiplanar CT image reconstructions of the cervical spine were also generated. COMPARISON:  CT soft tissue neck 04/23/2016 FINDINGS: CT HEAD FINDINGS Brain: Normal ventricular morphology. No midline shift or mass effect. Normal appearance of brain parenchyma. No intracranial hemorrhage, mass lesion, or evidence of acute infarction. No extra-axial fluid collections. Vascular: Mild atherosclerotic calcification of internal carotid arteries bilaterally at skull base Skull: Calvaria intact Sinuses/Orbits: Mucosal thickening LEFT maxillary sinus. Remaining visualized paranasal sinuses clear Other: N/A CT CERVICAL SPINE FINDINGS Alignment: Normal Skull base and vertebrae: Osseous mineralization normal. Visualized skull base intact. Vertebral body and disc space heights maintained. No fracture, subluxation or bone destruction. Soft tissues and spinal canal: Prevertebral soft tissues are mildly prominent with fluid versus mucous in the hypopharynx. Scattered atherosclerotic calcifications of the carotid and vertebral  arteries. Diffuse infiltration of soft tissue planes in the parapharyngeal region, which may be due to prior radiation therapy or tumor, suboptimally assessed by noncontrast exam, appears more pronounced than on priors contrast-enhanced CT soft tissue neck study. Narrowing of airway at the larynx and supraglottic region. Suboptimal assessment for adenopathy due to loss of tissue planes. Disc levels: Broad-based disc herniation at C6-C7 indenting thecal sac. Upper chest: Lung apices appear emphysematous but clear. Other: N/A IMPRESSION: No acute intracranial abnormalities.1 No acute cervical spine abnormalities. Emphysematous changes at lung apices. Fluid/mucous within the hypopharynx with narrowing of the airway at the level of the larynx and supraglottic region. Diffuse infiltration of soft tissue planes in the cervical region which may be related to prior radiation therapy for tonsillar cancer though recurrent tumor could cause a similar appearance; recommend follow-up dedicated CT soft tissue neck imaging with contrast when clinical condition permits to  further assess. Electronically Signed   By: Lavonia Dana M.D.   On: 02/06/2018 17:35   Ct Abdomen Pelvis W Contrast  Result Date: 02/07/2018 CLINICAL DATA:  62 year old male with shortness of breath. History of pancreatitis and throat cancer. Weight loss. EXAM: CT ANGIOGRAPHY CHEST CT ABDOMEN AND PELVIS WITH CONTRAST TECHNIQUE: Multidetector CT imaging of the chest was performed using the standard protocol during bolus administration of intravenous contrast. Multiplanar CT image reconstructions and MIPs were obtained to evaluate the vascular anatomy. Multidetector CT imaging of the abdomen and pelvis was performed using the standard protocol during bolus administration of intravenous contrast. CONTRAST:  67mL ISOVUE-370 IOPAMIDOL (ISOVUE-370) INJECTION 76% COMPARISON:  CT of the abdomen pelvis dated 07/11/2016 and radiograph dated 03/10/2017 FINDINGS: Evaluation  is limited due to streak artifact caused by patient's arms and overlying metallic support device. Evaluation is also limited due to anasarca and cachexia. CTA CHEST FINDINGS Cardiovascular: There is mild cardiomegaly. No pericardial effusion. There is coronary vascular calcification primarily involving the LAD. The aorta is poorly visualized and suboptimally opacified but grossly unremarkable. Evaluation of the pulmonary arteries is limited due to suboptimal enhancement of the peripheral branches. Apparent areas of decreased enhancement in the periphery of the distal branches of the right lower lobe (series 9 images 186-191) likely artifactual and related to volume averaging artifact with adjacent bronchi as well as poor contrast opacification. No definite CT evidence of pulmonary embolism. Mediastinum/Nodes: There is fullness of the right hilum measuring 15 mm in short axis. Ill-defined soft tissue density extending from the hila along the bronchi are not well evaluated but likely thickened bronchial wall with endobronchial debris. Evaluation of the mediastinum and hila is very limited. No mediastinal fluid collection. The esophagus is not well visualized. Lungs/Pleura: There are extensive consolidative changes of the lung bases and lower lobes progressed compared to the prior CT. There is diffuse areas of nodularity is at the lung bases. Diffuse thickening of the bronchial walls with mucous impaction involving the lower lobes. There is mucous impaction of the right lower lumbar bronchus. There is narrowing of the central bronchi likely secondary to edema. There is a small to moderate right and trace left pleural effusion. There is a background of emphysema. Minimal amount of air in the left apical pleural surface noted, appears chronic and seen on the CT of the cervical spine dated 02/06/2018. No pneumothorax. Musculoskeletal: Diffuse soft tissue edema and anasarca. There is loss of subcutaneous fat and cachexia.  No acute osseous pathology. Extensive right chest wall venous collaterals possibly related to a degree of SVC occlusion. Review of the MIP images confirms the above findings. CT ABDOMEN and PELVIS FINDINGS No intra-abdominal free air or free fluid. Hepatobiliary: The liver is grossly unremarkable. No intrahepatic biliary ductal dilatation. Cholecystectomy. Pancreas: Changes of chronic pancreatitis with dystrophic calcification and dilatation of the main pancreatic duct. Evaluation of the pancreas is limited due to suboptimal visualization. Spleen: Normal in size without focal abnormality. Adrenals/Urinary Tract: The adrenal glands are unremarkable. Small right renal cyst. The kidneys, kidneys and urinary bladder appear unremarkable. Stomach/Bowel: Oral contrast mixed with stool noted throughout the colon. There is no bowel dilatation or evidence of obstruction. The appendix is not identified with certainty. Vascular/Lymphatic: Extensive aortoiliac atherosclerotic disease. No portal venous gas. Evaluation for adenopathy is limited due to diffuse edema and anasarca as well as paucity of abdominal fat. Reproductive: The prostate and seminal vesicles are grossly unremarkable. Other: Diffuse edema and anasarca as well as cachexia. Musculoskeletal: No  acute osseous pathology.  Osteopenia. Review of the MIP images confirms the above findings. IMPRESSION: 1. No definite CT evidence of pulmonary embolism. 2. Diffuse thickening of the bronchial wall with endobronchial mucous content predominantly involving the lower lobes. Large areas of airspace opacity involving the lower lobes and lung bases bilaterally, right greater left most consistent with pneumonia possibly related to aspiration. Clinical correlation is recommended. Bilateral pleural effusions, right greater left. 3. No definite acute intra-abdominal or pelvic pathology. 4. Diffuse soft tissue edema and anasarca.  Cachexia. Electronically Signed   By: Anner Crete  M.D.   On: 02/07/2018 02:40   Dg Swallowing Func-speech Pathology  Result Date: 02/08/2018 Objective Swallowing Evaluation: Type of Study: MBS-Modified Barium Swallow Study  Patient Details Name: Estanislao Harmon MRN: [id number] Date of Birth: Mar 18, 1956 Today's Date: 02/08/2018 Time: SLP Start Time (ACUTE ONLY): 1345 -SLP Stop Time (ACUTE ONLY): 1407 SLP Time Calculation (min) (ACUTE ONLY): 22 min Past Medical History: Past Medical History: Diagnosis Date . Anemia  . Arthritis  . Cervical adenopathy 04/26/2012 . Chronic back pain   lower . Chronic pancreatitis ([place])  . Constipation  . COPD (chronic obstructive pulmonary disease) ([place])  . Daily headache 05/23/2012  "small ones" . Deficiency anemia 04/15/2016 . Dyspnea   with exertion . G tube feedings (HCC)   hx of  feeding tube in stomach . GERD (gastroesophageal reflux disease)  . History of blood transfusion ~ 2004  "from the pancreatitis" . History of radiation therapy 02/02/2011-03/22/2011  head/neck,left tonsil . Hx of radiation therapy 02/02/11 to 03/22/11  L tonsil . Hypertension  . Insomnia  . 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 Pancytopenia ([place]) 02/28/2014 . Poor venous access 04/28/2016 . 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 Thrush 11/02/2013 . 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 Urinary hesitancy  Past Surgical History: Past Surgical History: Procedure Laterality Date . BILE DUCT STENT PLACEMENT    hx of . CHOLECYSTECTOMY  04/2005 . DIRECT LARYNGOSCOPY  05/23/2012  Procedure: DIRECT LARYNGOSCOPY;  Surgeon: Rozetta Nunnery, MD;  Location: [place] [facility];  Service: ENT;  Laterality: N/A; . DIRECT LARYNGOSCOPY N/A 03/23/2013  Procedure: DIRECT LARYNGOSCOPY;  Surgeon: Rozetta Nunnery, MD;  Location: [facility];  Service: ENT;  Laterality: N/A; . ESOPHAGEAL DILATION N/A 03/23/2013  Procedure: ESOPHAGEAL DILATION;  Surgeon: Rozetta Nunnery, MD;  Location: [place];  Service: ENT;   Laterality: N/A; . ESOPHAGOGASTRODUODENOSCOPY (EGD) WITH PROPOFOL N/A 03/01/2017  Procedure: ESOPHAGOGASTRODUODENOSCOPY (EGD) WITH PROPOFOL;  Surgeon: Ladene Artist, MD;  [facility];  Service: Endoscopy;  Laterality: N/A; . FLEXIBLE SIGMOIDOSCOPY N/A 03/01/2017  Procedure: FLEXIBLE SIGMOIDOSCOPY;  Surgeon: Ladene Artist, MD;  Location: [facility];  Service: Endoscopy;  Laterality: N/A; . IR GENERIC HISTORICAL  04/29/2016  IR US GUIDE VASC ACCESS RIGHT 04/29/2016 [facility] . IR GENERIC HISTORICAL  04/29/2016  IR FLUORO GUIDE CV LINE RIGHT 04/29/2016 [facility] . IR GENERIC HISTORICAL  05/12/2016  IR US GUIDE VASC ACCESS LEFT 05/12/2016 [facility] . IR GENERIC HISTORICAL  05/12/2016  IR FLUORO GUIDE CV LINE LEFT 05/12/2016 [facility] . IR GENERIC HISTORICAL  06/28/2016  IR US GUIDE VASC ACCESS RIGHT 06/28/2016 Sandi Mariscal, MD [facility] . IR GENERIC HISTORICAL  06/28/2016  IR FLUORO GUIDE PORT INSERTION RIGHT 06/28/2016 Sandi Mariscal, MD [facility] . IR REMOVAL TUN ACCESS W/ PORT W/O FL MOD SED  02/10/2017 . MASS BIOPSY  05/23/2012  Procedure: NECK  MASS BIOPSY;  Surgeon: Rozetta Nunnery, MD;  Location: [place];  Service: ENT;  Laterality: N/A; . RADICAL NECK DISSECTION  05/23/2012  w/mass excision . RADICAL NECK DISSECTION  05/23/2012  Procedure: RADICAL NECK DISSECTION;  Surgeon: Rozetta Nunnery, MD;  Location: [facility];  Service: ENT;  Laterality: Left; . TIBIA FRACTURE SURGERY  1990's  left HPI: Pt is a 62 y.o. male admitted s/p fall (questionable LOC); CXR showed RLL PNA, CT Head negative for acute changes. 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   Subjective: describes having  to puree all his food to get it to go down at home Assessment / Plan / Recommendation CHL IP CLINICAL IMPRESSIONS 02/08/2018 Clinical Impression Pt has a moderate pharyngeal dysphagia suspected to be chronic in nature secondary to history of radiation tx for throat cancer. He has minimal hyolaryngeal excursion, epiglottic deflection, base of tongue retraction, and pharyngeal squeeze, resulting in poor swallow efficiency and limited airway closure. Moderate residue remains at the entrance of the laryngeal vestibule and pyriform sinuses with thin and nectar thick liquids, which are silently penetrated/aspiration during the swallow. His cough is not effective at clearing aspirates, but if he performs a heard cough and re-swallow he can clear most penetrates with cup sips of nectar thick liquids and spoonfuls of thin liquids, as penetration is more shallow. Increasing amounts of residue remain in the valleculae as consistencies become thicker, with honey thick liquids still penetrating into the laryngeal vestibule. Pt is likely at a high risk for aspiration, malnutrition, and dehydration with any consistencies recommended. In light of his acute PNA would favor a more conservative diet of Dys 1 textures and nectar thick liquids by cup using hard swallows, hard coughs, and hard second swallows. Teaspoons of thin water could be trialed between meals after oral care using the same strategies. MD may also wish to consider conversation about overall GOC given his likely chronic dysphagia. SLP Visit Diagnosis Dysphagia, pharyngoesophageal phase (R13.14) Attention and concentration deficit following -- Frontal lobe and executive function deficit following -- Impact on safety and function Severe aspiration risk;Moderate aspiration risk;Risk for inadequate nutrition/hydration   CHL IP TREATMENT RECOMMENDATION 02/08/2018 Treatment Recommendations Therapy as outlined in treatment plan below   Prognosis 02/08/2018 Prognosis for Safe Diet  Advancement Guarded Barriers to Reach Goals Time post onset;Severity of deficits Barriers/Prognosis Comment -- CHL IP DIET RECOMMENDATION 02/08/2018 SLP Diet Recommendations Dysphagia 1 (Puree) solids;Nectar thick liquid Liquid Administration via Cup;No straw Medication Administration Crushed with puree Compensations Slow rate;Small sips/bites;Hard cough after swallow;Multiple dry swallows after each bite/sip Postural Changes Remain semi-upright after after feeds/meals (Comment);Seated upright at 90 degrees   CHL IP OTHER RECOMMENDATIONS 02/08/2018 Recommended Consults -- Oral Care Recommendations Oral care QID Other Recommendations Have oral suction available;Order thickener from pharmacy;Prohibited food (jello, ice cream, thin soups);Remove water pitcher   CHL IP FOLLOW UP RECOMMENDATIONS 02/08/2018 Follow up Recommendations (No Data)   CHL IP FREQUENCY AND DURATION 02/08/2018 Speech Therapy Frequency (ACUTE ONLY) min 2x/week Treatment Duration 2 weeks      CHL IP ORAL PHASE 02/08/2018 Oral Phase WFL Oral - Pudding Teaspoon -- Oral - Pudding Cup -- Oral - Honey Teaspoon -- Oral - Honey Cup -- Oral - Nectar Teaspoon -- Oral - Nectar  Cup -- Oral - Nectar Straw -- Oral - Thin Teaspoon -- Oral - Thin Cup -- Oral - Thin Straw -- Oral - Puree -- Oral - Mech Soft -- Oral - Regular -- Oral - Multi-Consistency -- Oral - Pill -- Oral Phase - Comment --  CHL IP PHARYNGEAL PHASE 02/08/2018 Pharyngeal Phase Impaired Pharyngeal- Pudding Teaspoon -- Pharyngeal -- Pharyngeal- Pudding Cup -- Pharyngeal -- Pharyngeal- Honey Teaspoon -- Pharyngeal -- Pharyngeal- Honey Cup Delayed swallow initiation-vallecula;Reduced pharyngeal peristalsis;Reduced epiglottic inversion;Reduced anterior laryngeal mobility;Reduced laryngeal elevation;Reduced airway/laryngeal closure;Reduced tongue base retraction;Pharyngeal residue - valleculae;Penetration/Aspiration during swallow Pharyngeal Material enters airway, remains ABOVE vocal cords and not ejected out  Pharyngeal- Nectar Teaspoon -- Pharyngeal -- Pharyngeal- Nectar Cup Reduced pharyngeal peristalsis;Reduced epiglottic inversion;Reduced anterior laryngeal mobility;Reduced laryngeal elevation;Reduced airway/laryngeal closure;Reduced tongue base retraction;Pharyngeal residue - valleculae;Penetration/Aspiration during swallow;Penetration/Aspiration before swallow;Delayed swallow initiation-pyriform sinuses;Compensatory strategies attempted (with notebox) Pharyngeal Material enters airway, remains ABOVE vocal cords and not ejected out Pharyngeal- Nectar Straw -- Pharyngeal -- Pharyngeal- Thin Teaspoon Reduced pharyngeal peristalsis;Reduced epiglottic inversion;Reduced anterior laryngeal mobility;Reduced laryngeal elevation;Reduced airway/laryngeal closure;Reduced tongue base retraction;Pharyngeal residue - valleculae;Penetration/Aspiration during swallow;Delayed swallow initiation-vallecula;Compensatory strategies attempted (with notebox) Pharyngeal Material enters airway, remains ABOVE vocal cords and not ejected out Pharyngeal- Thin Cup Reduced pharyngeal peristalsis;Reduced epiglottic inversion;Reduced anterior laryngeal mobility;Reduced laryngeal elevation;Reduced airway/laryngeal closure;Reduced tongue base retraction;Pharyngeal residue - valleculae;Delayed swallow initiation-pyriform sinuses;Penetration/Aspiration before swallow;Penetration/Aspiration during swallow;Compensatory strategies attempted (with notebox) Pharyngeal Material enters airway, passes BELOW cords without attempt by patient to eject out (silent aspiration) Pharyngeal- Thin Straw -- Pharyngeal -- Pharyngeal- Puree Delayed swallow initiation-vallecula;Reduced pharyngeal peristalsis;Reduced epiglottic inversion;Reduced anterior laryngeal mobility;Reduced laryngeal elevation;Reduced airway/laryngeal closure;Reduced tongue base retraction;Pharyngeal residue - valleculae Pharyngeal -- Pharyngeal- Mechanical Soft -- Pharyngeal -- Pharyngeal- Regular  -- Pharyngeal -- Pharyngeal- Multi-consistency -- Pharyngeal -- Pharyngeal- Pill -- Pharyngeal -- Pharyngeal Comment --  CHL IP CERVICAL ESOPHAGEAL PHASE 02/08/2018 Cervical Esophageal Phase Impaired Pudding Teaspoon -- Pudding Cup -- Honey Teaspoon -- Honey Cup Reduced cricopharyngeal relaxation Nectar Teaspoon -- Nectar Cup Reduced cricopharyngeal relaxation Nectar Straw -- Thin Teaspoon Reduced cricopharyngeal relaxation Thin Cup Reduced cricopharyngeal relaxation Thin Straw -- Puree Reduced cricopharyngeal relaxation Mechanical Soft -- Regular -- Multi-consistency -- Pill -- Cervical Esophageal Comment -- No flowsheet data found. [name] 02/08/2018, 3:06 PM  [name], M.A. CCC-SLP [phone]                  Scheduled Meds: . azithromycin  500 mg Oral Daily  . Chlorhexidine Gluconate Cloth  6 each Topical Q0600  . enoxaparin (LOVENOX) injection  30 mg Subcutaneous Q24H  . feeding supplement (ENSURE ENLIVE)  237 mL Oral TID BM  . folic acid  1 mg Oral Daily  . guaiFENesin  600 mg Oral BID  . lipase/protease/amylase  12,000 Units Oral TID AC & HS  . mouth rinse  15 mL Mouth Rinse BID  . mirtazapine  30 mg Oral QHS  . multivitamin with minerals  1 tablet Oral Daily  . mupirocin ointment  1 application Nasal BID  . pantoprazole  20 mg Oral BID  . thiamine  100 mg Oral Daily   Continuous Infusions: . cefTRIAXone (ROCEPHIN)  IV Stopped (02/07/18 1705)     LOS: 2 days    Time spent: 35 minutes    Dana Allan, MD  [facility] Pager #: [phone] 7PM-7AM contact night coverage as above

## 2018-02-08 NOTE — Progress Notes (Signed)
Modified Barium Swallow Progress Note  Patient Details  Name: Russell Williamson MRN: [id number] Date of Birth: [birth year]  Today's Date: 02/08/2018  Modified Barium Swallow completed.  Full report located under Chart Review in the Imaging Section.  Brief recommendations include the following:  Clinical Impression  Pt has a moderate pharyngeal dysphagia suspected to be chronic in nature secondary to history of radiation tx for throat cancer. He has minimal hyolaryngeal excursion, epiglottic deflection, base of tongue retraction, and pharyngeal squeeze, resulting in poor swallow efficiency and limited airway closure. Moderate residue remains at the entrance of the laryngeal vestibule and pyriform sinuses with thin and nectar thick liquids, which are silently penetrated/aspiration during the swallow. His cough is not effective at clearing aspirates, but if he performs a heard cough and re-swallow he can clear most penetrates with cup sips of nectar thick liquids and spoonfuls of thin liquids, as penetration is more shallow. Increasing amounts of residue remain in the valleculae as consistencies become thicker, with honey thick liquids still penetrating into the laryngeal vestibule. Pt is likely at a high risk for aspiration, malnutrition, and dehydration with any consistencies recommended. In light of his acute PNA would favor a more conservative diet of Dys 1 textures and nectar thick liquids by cup using hard swallows, hard coughs, and hard second swallows. Teaspoons of thin water could be trialed between meals after oral care using the same strategies. MD may also wish to consider conversation about overall GOC given his likely chronic dysphagia.   Swallow Evaluation Recommendations       SLP Diet Recommendations: Dysphagia 1 (Puree) solids;Nectar thick liquid(tsp thin water in between meals after oral care)   Liquid Administration via: Cup;No straw   Medication Administration: Crushed with  puree   Supervision: Patient able to self feed;Full supervision/cueing for compensatory strategies   Compensations: Slow rate;Small sips/bites;Hard cough after swallow;Multiple dry swallows after each bite/sip   Postural Changes: Remain semi-upright after after feeds/meals (Comment);Seated upright at 90 degrees   Oral Care Recommendations: Oral care QID   Other Recommendations: Have oral suction available;Order thickener from pharmacy;Prohibited food (jello, ice cream, thin soups);Remove water pitcher    [name] 02/08/2018,3:06 PM   [name], M.A. CCC-SLP [phone]

## 2018-02-08 NOTE — Evaluation (Signed)
Occupational Therapy Evaluation Patient Details Name: Russell Williamson MRN: [id number] DOB: 07-09-1956 Today's Date: 02/08/2018    History of Present Illness Pt is a 62 y.o. male admitted 02/06/18 post-fall with fever and cough; CXR showed pneumonia. Worked up for CAP. Head CT shows no acute intracranial abnormality. PMH includes throat CA (s/p radiation 2012), OA, cervical adenopathy, chronic LBP, chronic pancreatitis, COPD, HTN, h/o homelessness.    Clinical Impression   Pt walked with a cane and was independent in self care prior to admission. He lives with his niece who takes care of meal prep and housekeeping. Pt presents with generalized weakness, decreased activity tolerance and impaired balance. He requires set up to min assist for ADL and supervision for basic transfers with RW. Will follow acutely. Recommending home with family's assist.     Follow Up Recommendations  Home health OT    Equipment Recommendations  Tub/shower seat    Recommendations for Other Services       Precautions / Restrictions Precautions Precautions: Fall Precaution Comments: Watch SpO2 Restrictions Weight Bearing Restrictions: No      Mobility Bed Mobility Overal bed mobility: Modified Independent             General bed mobility comments: Increased time and effort, HOB up slightly  Transfers Overall transfer level: Needs assistance Equipment used: Rolling walker (2 wheeled) Transfers: Sit to/from [facility] Sit to Stand: Supervision Stand pivot transfers: Supervision       General transfer comment: supervision for safety    Balance Overall balance assessment: Needs assistance   Sitting balance-Leahy Scale: Good       Standing balance-Leahy Scale: Fair Standing balance comment: Can static stand with no UE support                           ADL either performed or assessed with clinical judgement   ADL Overall ADL's : Needs  assistance/impaired Eating/Feeding: Supervision/ safety;Sitting   Grooming: Wash/dry hands;Wash/dry face;Sitting;Supervision/safety   Upper Body Bathing: Sitting;Supervision/ safety   Lower Body Bathing: Minimal assistance;Sit to/from stand   Upper Body Dressing : Supervision/safety;Sitting   Lower Body Dressing: Minimal assistance;Sit to/from stand   Toilet Transfer: Supervision/safety;Stand-pivot;BSC   Toileting- Water quality scientist and Hygiene: Minimal assistance;Sit to/from stand               Vision Patient Visual Report: No change from baseline       Perception     Praxis      Pertinent Vitals/Pain Pain Assessment: No/denies pain     Hand Dominance Right   Extremity/Trunk Assessment Upper Extremity Assessment Upper Extremity Assessment: Generalized weakness   Lower Extremity Assessment Lower Extremity Assessment: Defer to PT evaluation   Cervical / Trunk Assessment Cervical / Trunk Assessment: Kyphotic   Communication Communication Communication: HOH   Cognition Arousal/Alertness: Awake/alert Behavior During Therapy: Flat affect Overall Cognitive Status: No family/caregiver present to determine baseline cognitive functioning Area of Impairment: Safety/judgement;Awareness                         Safety/Judgement: Decreased awareness of deficits Awareness: Emergent       General Comments       Exercises     Shoulder Instructions      Home Living Family/patient expects to be discharged to:: Private residence Living Arrangements: Other relatives Available Help at Discharge: Family;Available PRN/intermittently(niece and her kids) Type of Home: House Home Access: Stairs to enter  Entrance Stairs-Number of Steps: 1   Home Layout: One level     Bathroom Shower/Tub: Teacher, early years/pre: Standard     Home Equipment: Cane - single point          Prior Functioning/Environment Level of Independence:  Independent with assistive device(s)        Comments: Reports mod indep with SPC. Denies falls (although fall supposedly led to admission). Indep with ADLs. Niece/family does cooking and driving        OT Problem List: Decreased strength;Decreased activity tolerance;Impaired balance (sitting and/or standing);Decreased safety awareness;Decreased knowledge of use of DME or AE;Cardiopulmonary status limiting activity;Decreased cognition      OT Treatment/Interventions: Self-care/ADL training;DME and/or AE instruction;Patient/family education;Balance training;Therapeutic activities    OT Goals(Current goals can be found in the care plan section) Acute Rehab OT Goals Patient Stated Goal: Return home OT Goal Formulation: With patient Time For Goal Achievement: 02/22/18 Potential to Achieve Goals: Good ADL Goals Pt Will Perform Grooming: with supervision;standing Pt Will Perform Lower Body Bathing: with supervision;sit to/from stand Pt Will Perform Lower Body Dressing: with supervision;sit to/from stand Pt Will Transfer to Toilet: with supervision;ambulating Pt Will Perform Toileting - Clothing Manipulation and hygiene: with supervision;sit to/from stand Additional ADL Goal #1: Pt will be knowledgeable in energy conservation strategies during ADL and mobility including benefits of seated showering.  OT Frequency: Min 2X/week   Barriers to D/C:            Co-evaluation              AM-PAC PT "6 Clicks" Daily Activity     Outcome Measure Help from another person eating meals?: None Help from another person taking care of personal grooming?: A Little Help from another person toileting, which includes using toliet, bedpan, or urinal?: A Little Help from another person bathing (including washing, rinsing, drying)?: A Little Help from another person to put on and taking off regular upper body clothing?: None Help from another person to put on and taking off regular lower body  clothing?: A Little 6 Click Score: 20   End of Session Equipment Utilized During Treatment: Gait belt;Rolling walker;Oxygen  Activity Tolerance: Patient limited by fatigue Patient left: in bed;with call bell/phone within reach;with bed alarm set  OT Visit Diagnosis: Other abnormalities of gait and mobility (R26.89);Muscle weakness (generalized) (M62.81)                Time: 9417-4081 OT Time Calculation (min): 18 min Charges:  OT General Charges $OT Visit: 1 Visit OT Evaluation $OT Eval Moderate Complexity: 1 Mod G-Codes:     01-Mar-2018 Nestor Lewandowsky, OTR/L Pager: [phone]  Werner Lean, Haze Boyden 03/01/18, 4:10 PM

## 2018-02-09 ENCOUNTER — Inpatient Hospital Stay (HOSPITAL_COMMUNITY): Payer: Medicaid Other

## 2018-02-09 LAB — COMPREHENSIVE METABOLIC PANEL
ALK PHOS: 59 U/L (ref 38–126)
ALT: 86 U/L — ABNORMAL HIGH (ref 17–63)
AST: 121 U/L — AB (ref 15–41)
Albumin: 2.4 g/dL — ABNORMAL LOW (ref 3.5–5.0)
Anion gap: 11 (ref 5–15)
BILIRUBIN TOTAL: 0.5 mg/dL (ref 0.3–1.2)
BUN: 8 mg/dL (ref 6–20)
CO2: 28 mmol/L (ref 22–32)
Calcium: 8.4 mg/dL — ABNORMAL LOW (ref 8.9–10.3)
Chloride: 96 mmol/L — ABNORMAL LOW (ref 101–111)
Creatinine, Ser: 0.44 mg/dL — ABNORMAL LOW (ref 0.61–1.24)
GFR calc Af Amer: >60 mL/min (ref >60)
GFR calc non Af Amer: >60 mL/min (ref >60)
GLUCOSE: 116 mg/dL — AB (ref 65–99)
POTASSIUM: 4 mmol/L (ref 3.5–5.1)
Sodium: 135 mmol/L (ref 135–145)
TOTAL PROTEIN: 5.6 g/dL — AB (ref 6.5–8.1)

## 2018-02-09 LAB — CBC
HEMATOCRIT: 33.5 % — AB (ref 39.0–52.0)
HEMOGLOBIN: 11.3 g/dL — AB (ref 13.0–17.0)
MCH: 31.8 pg (ref 26.0–34.0)
MCHC: 33.7 g/dL (ref 30.0–36.0)
MCV: 94.4 fL (ref 78.0–100.0)
Platelets: 175 10*3/uL (ref 150–400)
RBC: 3.55 MIL/uL — ABNORMAL LOW (ref 4.22–5.81)
RDW: 16.7 % — AB (ref 11.5–15.5)
WBC: 5.5 10*3/uL (ref 4.0–10.5)

## 2018-02-09 LAB — MAGNESIUM: Magnesium: 1.2 mg/dL — ABNORMAL LOW (ref 1.7–2.4)

## 2018-02-09 MED ORDER — LACTATED RINGERS IV SOLN
INTRAVENOUS | Status: AC
  Administered 2018-02-09: 50 mL via INTRAVENOUS
  Administered 2018-02-10: 50 mL/h via INTRAVENOUS

## 2018-02-09 MED ORDER — IOHEXOL 300 MG/ML  SOLN
100.0000 mL | Freq: Once | INTRAMUSCULAR | Status: AC | PRN
  Administered 2018-02-09: 100 mL via INTRAVENOUS

## 2018-02-09 MED ORDER — MAGNESIUM SULFATE 4 GM/100ML IV SOLN
4.0000 g | Freq: Once | INTRAVENOUS | Status: AC
  Administered 2018-02-09: 4 g via INTRAVENOUS
  Filled 2018-02-09: qty 100

## 2018-02-09 NOTE — Progress Notes (Signed)
Physical Therapy Treatment Patient Details Name: Russell Williamson MRN: [id number] DOB: [birth year] Today's Date: 02/09/2018    History of Present Illness Pt is a 62 y.o. male admitted 02/06/18 post-fall with fever and cough; CXR showed pneumonia. Worked up for CAP. Head CT shows no acute intracranial abnormality. PMH includes throat CA (s/p radiation 2012), OA, cervical adenopathy, chronic LBP, chronic pancreatitis, COPD, HTN, h/o homelessness.     PT Comments    Pt is limited in his progress towards his goals today by increased R LE pain with ambulation. Pt reports that he just had pain medication however it is still aching. Pt currently mod I for bed mobility and supervision for transfers. Pt min guard for ambulation of 90 feet with RW, with increasingly antalgic gait on R side. Despite increased pain with ambulation pt continues to be safe with his mobility and d/c home with HHPT is still appropriate.     Follow Up Recommendations  Home health PT;Supervision - Intermittent     Equipment Recommendations  Rolling walker with 5" wheels    Recommendations for Other Services       Precautions / Restrictions Precautions Precautions: Fall Precaution Comments: Watch SpO2 Restrictions Weight Bearing Restrictions: No    Mobility  Bed Mobility Overal bed mobility: Modified Independent             General bed mobility comments: Increased time and effort, HOB up slightly  Transfers Overall transfer level: Needs assistance Equipment used: Rolling walker (2 wheeled) Transfers: Sit to/from Stand Sit to Stand: Supervision         General transfer comment: supervision for safety  Ambulation/Gait Ambulation/Gait assistance: Min guard Ambulation Distance (Feet): 90 Feet Assistive device: Rolling walker (2 wheeled) Gait Pattern/deviations: Step-through pattern;Decreased stride length;Decreased weight shift to right;Antalgic Gait velocity: Decreased Gait velocity interpretation:  <1.8 ft/sec, indicate of risk for recurrent falls General Gait Details: min guard for safety, slowed antalgic gait with decreased weight shift onto R LE.      Balance Overall balance assessment: Needs assistance   Sitting balance-Leahy Scale: Good       Standing balance-Leahy Scale: Fair Standing balance comment: Can static stand with no UE support                            Cognition Arousal/Alertness: Awake/alert Behavior During Therapy: Flat affect Overall Cognitive Status: No family/caregiver present to determine baseline cognitive functioning Area of Impairment: Safety/judgement;Awareness                         Safety/Judgement: Decreased awareness of deficits Awareness: Emergent             General Comments General comments (skin integrity, edema, etc.): VSS      Pertinent Vitals/Pain Pain Assessment: 0-10 Pain Score: 4  Pain Location: R leg Pain Descriptors / Indicators: Aching Pain Intervention(s): Limited activity within patient's tolerance;Monitored during session;Repositioned;Premedicated before session           PT Goals (current goals can now be found in the care plan section) Acute Rehab PT Goals Patient Stated Goal: Return home PT Goal Formulation: With patient Time For Goal Achievement: 02/21/18 Potential to Achieve Goals: Good Progress towards PT goals: Not progressing toward goals - comment(limited by R LE pain with ambulation)    Frequency    Min 3X/week      PT Plan Current plan remains appropriate       AM-PAC  PT "6 Clicks" Daily Activity  Outcome Measure  Difficulty turning over in bed (including adjusting bedclothes, sheets and blankets)?: None Difficulty moving from lying on back to sitting on the side of the bed? : None Difficulty sitting down on and standing up from a chair with arms (e.g., wheelchair, bedside commode, etc,.)?: A Little Help needed moving to and from a bed to chair (including a  wheelchair)?: A Little Help needed walking in hospital room?: A Little Help needed climbing 3-5 steps with a railing? : A Little 6 Click Score: 20    End of Session Equipment Utilized During Treatment: Gait belt Activity Tolerance: Patient limited by pain Patient left: in bed;with call bell/phone within reach Nurse Communication: Mobility status;Other (comment)(request for Ensure) PT Visit Diagnosis: Other abnormalities of gait and mobility (R26.89)     Time: 9507-2257 PT Time Calculation (min) (ACUTE ONLY): 19 min  Charges:  $Gait Training: 8-22 mins                    G Codes:       Hani Campusano B. Migdalia Dk PT, DPT Acute Rehabilitation  [phone] Pager [phone]     [place] 02/09/2018, 4:53 PM

## 2018-02-09 NOTE — Progress Notes (Signed)
  Speech Language Pathology Treatment: Dysphagia  Patient Details Name: Russell Williamson MRN: [id number] DOB: Mar 25, 1956 Today's Date: 02/09/2018 Time: 8828-0034 SLP Time Calculation (min) (ACUTE ONLY): 19 min  Assessment / Plan / Recommendation Clinical Impression  Pt needed Mod cues for recall of recommendations from MBS and for utilization of strategies during PO intake. Pt does multiple swallows with Min cues, but needs Mod-Max cues to use a strong cough after cup sips. Given that he does not sense airway invasion, he says that he does not need to cough. Further education was given about rationale. Pt had an episode of delayed, persistent coughing that culminated in expectoration of what appeared to be frothy secretions. Although pt on previous date denied any prior episodes of PNA, today he suggests that he has in fact been treated with PNA before. Would continue current diet and precautions for now, although continue to recommend additional conversations re: [place] given that pt's dysphagia is more chronic in nature.    HPI HPI: Pt is a 62 y.o. male admitted s/p fall (questionable LOC); CXR showed RLL PNA, CT Head negative for acute changes. 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        SLP Plan  Continue with current plan of care       Recommendations  Diet recommendations: Dysphagia 1 (puree);Nectar-thick liquid(tsp water btwn meals, after oral care) Liquids provided via: Cup;No straw Medication Administration: Crushed with puree Supervision: Patient able to self feed;Full supervision/cueing for compensatory strategies Compensations: Slow rate;Small sips/bites;Hard  cough after swallow;Multiple dry swallows after each bite/sip Postural Changes and/or Swallow Maneuvers: Seated upright 90 degrees;Upright 30-60 min after meal                Oral Care Recommendations: Oral care BID Follow up Recommendations: Home health SLP SLP Visit Diagnosis: Dysphagia, pharyngoesophageal phase (R13.14) Plan: Continue with current plan of care       GO                Russell Williamson 02/09/2018, 11:07 AM  Russell Williamson, M.A. CCC-SLP [phone]

## 2018-02-09 NOTE — Progress Notes (Signed)
PROGRESS NOTE    Russell Williamson  TIW:[id number] DOB: 1955-10-26 DOA: 02/06/2018 PCP: Harvie Junior, MD   Brief Narrative:  Per PN [name] Ismerai Bin 62 y.o.malewith medical history significant of throat cancer,history of homelessness,anemia, osteoarthritis, cervical adenopathy, chronic lower back pain, chronic pancreatitis, constipation, chronic dyspnea, GERD, COPD, HTN.  Patient was admitted with possible pneumonia.  Patient has also had recurrent falls.  Magnesium today is 1.4.  02/08/2018: Patient seen.  No new complaints.  No fever or chills.  Concerns for aspiration persists.  Speech assessment input is highly appreciated.  CT scan chest finding is noted.  Will continue antibiotics for now.  Assessment & Plan:   Active Problems:   History of cancer tonsil   HTN (hypertension)   Hypothyroidism   Malnutrition of moderate degree (HCC)   Anemia in chronic illness   COPD with chronic bronchitis (HCC)   CAP (community acquired pneumonia)   Syncope   Hyponatremia   Dehydration   Fall at home, initial encounter   Tobacco abuse   Pressure injury of skin  Community acquired pneumonia: -Complete work-up. -Follow results of the work-up already done. -currently on ceftriaxone/azithromycin -blood cx NGTD -sputum cx with rare yeast, moderate staph aureus.  Will follow susceptibilities.  Seems to be improving on current regimen, but consider adjusting with staph.   Dysphagia:  Pt with history of throat cancer, followed by Dr. Alvy Bimler.  Recent CT scan at presentation with "diffuse infiltration of soft tissue planes in cervical region" (possibly 2/2 radiation therapy vs recurrent tumor).  With his recent weight loss and dysphagia, will follow up CT soft tissue neck with contrast. - dysphagia 1 diet, nectar thick.  Discussed risks of aspiration with patient.  - appreciate speech therapy recs  Hypertension: -Continue to hold antihypertensives (amlodipine) - BP's  appropriate  COPD: -This is stable.  Hyponatremia: -improved.  Follow.  Volume depletion: - continue IVF for now.  Seems to be improved  Cachexia: -Patient has history of throat cancer. -follow imaging as noted above -Per chart review (see weights below), he's lost about 6-7 kg over past year  Wt Readings from Last 3 Encounters:  02/07/18 40.2 kg (88 lb 9.6 oz)  07/02/17 42.9 kg (94 lb 9.6 oz)  04/13/17 43.1 kg (95 lb)   Elevated liver enzymes: improving, negative HIV.  Follow acute hepatitis panel.  CT abdomen/pelvis at presentation with grossly unremarkable liver.  Hx cholecystectomy.   Hypomagnesemia: replete and follow   Elevated troponin: noted on presentation, suspect demand in setting of above  DVT prophylaxis: lovenox Code Status: full, he confirmed this today 5/9 Family Communication:  Called sister and niece, no answer Disposition Plan: pending further w/u and improvement   Consultants:   none  Procedures:   none  Antimicrobials: Anti-infectives (From admission, onward)   Start     Dose/Rate Route Frequency Ordered Stop   02/07/18 1800  azithromycin (ZITHROMAX) 500 mg in sodium chloride 0.9 % 250 mL IVPB  Status:  Discontinued     500 mg 250 mL/hr over 60 Minutes Intravenous Every 24 hours 02/06/18 2310 02/07/18 0919   02/07/18 1800  cefTRIAXone (ROCEPHIN) 1 g in sodium chloride 0.9 % 100 mL IVPB     1 g 200 mL/hr over 30 Minutes Intravenous Every 24 hours 02/06/18 2310 02/14/18 1759   02/07/18 1800  azithromycin (ZITHROMAX) tablet 500 mg     500 mg Oral Daily 02/07/18 0919 02/11/18 1759   02/06/18 1800  cefTRIAXone (ROCEPHIN) 1 g in sodium  chloride 0.9 % 100 mL IVPB     1 g 200 mL/hr over 30 Minutes Intravenous  Once 02/06/18 1758 02/06/18 2046   02/06/18 1800  azithromycin (ZITHROMAX) 500 mg in sodium chloride 0.9 % 250 mL IVPB     500 mg 250 mL/hr over 60 Minutes Intravenous  Once 02/06/18 1758 02/06/18 2226     Subjective: Doing  ok. Feeling better. Tolerating some PO.   Objective: Vitals:   02/08/18 0811 02/08/18 0811 02/08/18 1705 02/09/18 0859  BP: 115/79 115/79 126/86 120/64  Pulse: 72 72 76 67  Resp: 18 18 19    Temp: 98.4 F (36.9 C) 98.4 F (36.9 C) 97.8 F (36.6 C) 98.3 F (36.8 C)  TempSrc: Oral Oral Oral Oral  SpO2: 96% 96% 93% 92%  Weight:      Height:        Intake/Output Summary (Last 24 hours) at 02/09/2018 1527 Last data filed at 02/09/2018 0500 Gross per 24 hour  Intake -  Output 350 ml  Net -350 ml   Filed Weights   02/07/18 1423  Weight: 40.2 kg (88 lb 9.6 oz)    Examination:  General exam: Appears calm and comfortable, cachetic Respiratory system: Clear to auscultation. Respiratory effort normal. Cardiovascular system: S1 & S2 heard, RRR. No JVD, murmurs, rubs, gallops or clicks. No pedal edema. Gastrointestinal system: Abdomen is nondistended, soft and nontender. No organomegaly or masses felt. Normal bowel sounds heard. Central nervous system: Alert and oriented. No focal neurological deficits. Extremities: Symmetric 5 x 5 power. Skin: No rashes, lesions or ulcers Psychiatry: Judgement and insight appear normal. Mood & affect appropriate.     Data Reviewed: I have personally reviewed following labs and imaging studies  CBC: Recent Labs  Lab 02/06/18 1616 02/07/18 0807 02/09/18 0925  WBC 12.6* 9.2 5.5  NEUTROABS 10.8*  --   --   HGB 12.1* 11.2* 11.3*  HCT 35.2* 33.3* 33.5*  MCV 96.2 95.1 94.4  PLT 165 172 213   Basic Metabolic Panel: Recent Labs  Lab 02/06/18 1616 02/06/18 2115 02/07/18 0807 02/09/18 0925  NA 131*  --  136 135  K 5.3*  --  4.4 4.0  CL 88*  --  98* 96*  CO2 31  --  27 28  GLUCOSE 86  --  79 116*  BUN 32*  --  18 8  CREATININE 0.80  --  0.49* 0.44*  CALCIUM 8.9  --  8.4* 8.4*  MG  --  1.4* 1.9 1.2*  PHOS  --  3.1 2.6  --    GFR: Estimated Creatinine Clearance: 55.1 mL/min (Sharanda Shinault) (by C-G formula based on SCr of 0.44 mg/dL (L)). Liver  Function Tests: Recent Labs  Lab 02/06/18 2115 02/07/18 0807 02/09/18 0925  AST 367* 267* 121*  ALT 149* 134* 86*  ALKPHOS 82 74 59  BILITOT 1.1 0.6 0.5  PROT 6.4* 6.0* 5.6*  ALBUMIN 2.8* 2.7* 2.4*   No results for input(s): LIPASE, AMYLASE in the last 168 hours. No results for input(s): AMMONIA in the last 168 hours. Coagulation Profile: Recent Labs  Lab 02/07/18 0003 02/07/18 0807  INR 1.40 1.34   Cardiac Enzymes: Recent Labs  Lab 02/06/18 2115 02/07/18 0300 02/07/18 0807  CKTOTAL 3,190*  --   --   TROPONINI 0.08* 0.06* 0.05*   BNP (last 3 results) No results for input(s): PROBNP in the last 8760 hours. HbA1C: No results for input(s): HGBA1C in the last 72 hours. CBG: No results for input(s):  GLUCAP in the last 168 hours. Lipid Profile: No results for input(s): CHOL, HDL, LDLCALC, TRIG, CHOLHDL, LDLDIRECT in the last 72 hours. Thyroid Function Tests: Recent Labs    02/07/18 0300  TSH 2.254   Anemia Panel: No results for input(s): VITAMINB12, FOLATE, FERRITIN, TIBC, IRON, RETICCTPCT in the last 72 hours. Sepsis Labs: Recent Labs  Lab 02/06/18 1658 02/07/18 0003 02/07/18 0300  PROCALCITON  --  10.50  --   LATICACIDVEN 2.6* 2.5* 2.0*    Recent Results (from the past 240 hour(s))  Blood Culture (routine x 2)     Status: None (Preliminary result)   Collection Time: 02/06/18  6:02 PM  Result Value Ref Range Status   Specimen Description BLOOD RIGHT ANTECUBITAL  Final   Special Requests   Final    BOTTLES DRAWN AEROBIC AND ANAEROBIC Blood Culture adequate volume   Culture   Final    NO GROWTH 3 DAYS Performed at [facility], [place] [address], [place], [place] [address]    Report Status PENDING  Incomplete  Blood Culture (routine x 2)     Status: None (Preliminary result)   Collection Time: 02/06/18  6:30 PM  Result Value Ref Range Status   Specimen Description BLOOD LEFT ANTECUBITAL  Final   Special Requests   Final    BOTTLES DRAWN AEROBIC  AND ANAEROBIC Blood Culture adequate volume   Culture   Final    NO GROWTH 3 DAYS Performed at [facility], [place] [address] [place], [place] [address]    Report Status PENDING  Incomplete  MRSA PCR Screening     Status: Abnormal   Collection Time: 02/07/18  2:26 AM  Result Value Ref Range Status   MRSA by PCR POSITIVE (Roshana Shuffield) NEGATIVE Final    Comment:        The GeneXpert MRSA Assay (FDA approved for NASAL specimens only), is one component of [place] comprehensive MRSA colonization surveillance program. It is not intended to diagnose MRSA infection nor to guide or monitor treatment for MRSA infections. RESULT CALLED TO, READ BACK BY AND VERIFIED WITH: Alyssa Grove RN 02/07/18 0631 JDW Performed at [facility], [address], [place], [place] [address]   Culture, sputum-assessment     Status: None   Collection Time: 02/07/18  5:42 PM  Result Value Ref Range Status   Specimen Description EXPECTORATED SPUTUM  Final   Special Requests Normal  Final   Sputum evaluation   Final    THIS SPECIMEN IS ACCEPTABLE FOR SPUTUM CULTURE Performed at [facility], [address] [place], [place] [address]    Report Status 02/08/2018 FINAL  Final  Culture, respiratory (NON-Expectorated)     Status: None (Preliminary result)   Collection Time: 02/07/18  5:42 PM  Result Value Ref Range Status   Specimen Description EXPECTORATED SPUTUM  Final   Special Requests Normal Reflexed from [id number]  Final   Gram Stain   Final    ABUNDANT WBC PRESENT,BOTH PMN AND MONONUCLEAR MODERATE GRAM POSITIVE COCCI RARE YEAST FEW SQUAMOUS EPITHELIAL CELLS PRESENT    Culture   Final    MODERATE STAPHYLOCOCCUS AUREUS SUSCEPTIBILITIES TO FOLLOW Performed at [facility], [employer] [address] [place], [place] [address]    Report Status PENDING  Incomplete         Radiology Studies: Dg Swallowing Func-speech Pathology  Result Date: 02/08/2018 Objective Swallowing Evaluation: Type of Study:  MBS-Modified Barium Swallow Study  Patient Details Name:  Russell Williamson MRN: [id number] Date of Birth: 1956-06-04 Today's Date: 02/08/2018 Time: SLP Start Time (ACUTE ONLY): 1345 -SLP Stop Time (ACUTE ONLY): 1407 SLP Time Calculation (min) (ACUTE ONLY): 22 min Past Medical History: Past Medical History: Diagnosis Date . Anemia  . Arthritis  . Cervical adenopathy 04/26/2012 . Chronic back pain   lower . Chronic pancreatitis ([place])  . Constipation  . COPD (chronic obstructive pulmonary disease) ([place])  . Daily headache 05/23/2012  "small ones" . Deficiency anemia 04/15/2016 . Dyspnea   with exertion . G tube feedings (HCC)   hx of  feeding tube in stomach . GERD (gastroesophageal reflux disease)  . History of blood transfusion ~ 2004  "from the pancreatitis" . History of radiation therapy 02/02/2011-03/22/2011  head/neck,left tonsil . Hx of radiation therapy 02/02/11 to 03/22/11  L tonsil . Hypertension  . Insomnia  . 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 Neck malignant neoplasm ([place]) 05/23/2012 . Pancytopenia ([place]) 02/28/2014 . Poor venous access 04/28/2016 . 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 Thrush 11/02/2013 . Tonsillar cancer ([place]) 12/15/2010  Left . Urinary hesitancy  Past Surgical History: Past Surgical History: Procedure Laterality Date . BILE DUCT STENT PLACEMENT    hx of . CHOLECYSTECTOMY  04/2005 . DIRECT LARYNGOSCOPY  05/23/2012  Procedure: DIRECT LARYNGOSCOPY;  Surgeon: Rozetta Nunnery, MD;  Location: [facility];  Service: ENT;  Laterality: N/Deb Loudin; . DIRECT LARYNGOSCOPY N/Laurieann Friddle 03/23/2013  Procedure: DIRECT LARYNGOSCOPY;  Surgeon: Rozetta Nunnery, MD;  Location: [facility];  Service: ENT;  Laterality: N/Akili Corsetti; . ESOPHAGEAL DILATION N/Tyhir Schwan 03/23/2013  Procedure: ESOPHAGEAL DILATION;  Surgeon: Rozetta Nunnery, MD;  Location: [facility];  Service: ENT;  Laterality: N/Kierstan Auer; . ESOPHAGOGASTRODUODENOSCOPY (EGD) WITH PROPOFOL N/Laresha Bacorn 03/01/2017  Procedure: ESOPHAGOGASTRODUODENOSCOPY (EGD) WITH PROPOFOL;   Surgeon: Ladene Artist, MD;  [facility];  Service: Endoscopy;  Laterality: N/Retaj Hilbun; . FLEXIBLE SIGMOIDOSCOPY N/Makyla Bye 03/01/2017  Procedure: FLEXIBLE SIGMOIDOSCOPY;  Surgeon: Ladene Artist, MD;  [facility];  Service: Endoscopy;  Laterality: N/Heather Streeper; . IR GENERIC HISTORICAL  04/29/2016  IR US GUIDE VASC ACCESS RIGHT 04/29/2016 [facility] . IR GENERIC HISTORICAL  04/29/2016  IR FLUORO GUIDE CV LINE RIGHT 04/29/2016 [facility] . IR GENERIC HISTORICAL  05/12/2016  IR US GUIDE VASC ACCESS LEFT 05/12/2016 [facility] . IR GENERIC HISTORICAL  05/12/2016  IR FLUORO GUIDE CV LINE LEFT 05/12/2016 [facility] . IR GENERIC HISTORICAL  06/28/2016  IR US GUIDE VASC ACCESS RIGHT 06/28/2016 Sandi Mariscal, MD [facility] . IR GENERIC HISTORICAL  06/28/2016  IR FLUORO GUIDE PORT INSERTION RIGHT 06/28/2016 Sandi Mariscal, MD [facility] . IR REMOVAL TUN ACCESS W/ PORT W/O FL MOD SED  02/10/2017 . MASS BIOPSY  05/23/2012  Procedure: NECK MASS BIOPSY;  Surgeon: Rozetta Nunnery, MD;  Location: [facility];  Service: ENT;  Laterality: N/Amauri Keefe; . RADICAL NECK DISSECTION  05/23/2012  w/mass excision . RADICAL NECK DISSECTION  05/23/2012  Procedure: RADICAL NECK DISSECTION;  Surgeon: Rozetta Nunnery, MD;  Location: [facility];  Service: ENT;  Laterality: Left; . TIBIA FRACTURE SURGERY  1990's  left HPI: Pt is Damarcus Reggio 62 y.o. male admitted s/p fall (questionable LOC); CXR showed RLL PNA, CT Head negative for acute changes. 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   Subjective: describes having to puree all his food to get it to go down at home Assessment / Plan / Recommendation CHL IP CLINICAL IMPRESSIONS 02/08/2018 Clinical Impression  Pt has Brittan Butterbaugh moderate pharyngeal dysphagia suspected to be chronic in nature secondary to history of radiation tx for throat cancer. He has minimal hyolaryngeal excursion, epiglottic deflection, base of tongue retraction, and pharyngeal squeeze, resulting in poor swallow efficiency and limited airway closure. Moderate residue remains at the entrance of the laryngeal vestibule and pyriform sinuses with thin and nectar thick liquids, which are silently penetrated/aspiration during the swallow. His cough is not effective at clearing aspirates, but if he performs Jovonte Commins heard cough and re-swallow he can clear most penetrates with cup sips of nectar thick liquids and spoonfuls of thin liquids, as penetration is more shallow. Increasing amounts of residue remain in the valleculae as consistencies become thicker, with honey thick liquids still penetrating into the laryngeal vestibule. Pt is likely at Damaria Vachon high risk for aspiration, malnutrition, and dehydration with any consistencies recommended. In light of his acute PNA would favor Erykah Lippert more conservative diet of Dys 1 textures and nectar thick liquids by cup using hard swallows, hard coughs, and hard second swallows. Teaspoons of thin water could be trialed between meals after oral care using the same strategies. MD may also wish to consider conversation about overall GOC given his likely chronic dysphagia. SLP Visit Diagnosis Dysphagia, pharyngoesophageal phase (R13.14) Attention and concentration deficit following -- Frontal lobe and executive function deficit following -- Impact on safety and function Severe aspiration risk;Moderate aspiration risk;Risk for inadequate nutrition/hydration   CHL IP TREATMENT RECOMMENDATION 02/08/2018 Treatment Recommendations Therapy as outlined in treatment plan below   Prognosis 02/08/2018 Prognosis for Safe Diet Advancement Guarded Barriers to Reach Goals Time post onset;Severity of deficits Barriers/Prognosis Comment -- CHL IP DIET RECOMMENDATION  02/08/2018 SLP Diet Recommendations Dysphagia 1 (Puree) solids;Nectar thick liquid Liquid Administration via Cup;No straw Medication Administration Crushed with puree Compensations Slow rate;Small sips/bites;Hard cough after swallow;Multiple dry swallows after each bite/sip Postural Changes Remain semi-upright after after feeds/meals (Comment);Seated upright at 90 degrees   CHL IP OTHER RECOMMENDATIONS 02/08/2018 Recommended Consults -- Oral Care Recommendations Oral care QID Other Recommendations Have oral suction available;Order thickener from pharmacy;Prohibited food (jello, ice cream, thin soups);Remove water pitcher   CHL IP FOLLOW UP RECOMMENDATIONS 02/08/2018 Follow up Recommendations (No Data)   CHL IP FREQUENCY AND DURATION 02/08/2018 Speech Therapy Frequency (ACUTE ONLY) min 2x/week Treatment Duration 2 weeks      CHL IP ORAL PHASE 02/08/2018 Oral Phase WFL Oral - Pudding Teaspoon -- Oral - Pudding Cup -- Oral - Honey Teaspoon -- Oral - Honey Cup -- Oral - Nectar Teaspoon -- Oral - Nectar Cup -- Oral - Nectar Straw -- Oral - Thin Teaspoon -- Oral - Thin Cup -- Oral - Thin Straw -- Oral - Puree -- Oral - Mech Soft -- Oral - Regular -- Oral - Multi-Consistency -- Oral - Pill -- Oral Phase - Comment --  CHL IP PHARYNGEAL PHASE 02/08/2018 Pharyngeal Phase Impaired Pharyngeal- Pudding Teaspoon -- Pharyngeal -- Pharyngeal- Pudding Cup -- Pharyngeal -- Pharyngeal- Honey Teaspoon -- Pharyngeal -- Pharyngeal- Honey Cup Delayed swallow initiation-vallecula;Reduced pharyngeal peristalsis;Reduced epiglottic inversion;Reduced anterior laryngeal mobility;Reduced laryngeal elevation;Reduced airway/laryngeal closure;Reduced tongue base retraction;Pharyngeal residue - valleculae;Penetration/Aspiration during swallow Pharyngeal Material enters airway, remains ABOVE vocal cords and not ejected out Pharyngeal- Nectar Teaspoon -- Pharyngeal -- Pharyngeal- Nectar Cup Reduced pharyngeal peristalsis;Reduced epiglottic inversion;Reduced  anterior laryngeal  mobility;Reduced laryngeal elevation;Reduced airway/laryngeal closure;Reduced tongue base retraction;Pharyngeal residue - valleculae;Penetration/Aspiration during swallow;Penetration/Aspiration before swallow;Delayed swallow initiation-pyriform sinuses;Compensatory strategies attempted (with notebox) Pharyngeal Material enters airway, remains ABOVE vocal cords and not ejected out Pharyngeal- Nectar Straw -- Pharyngeal -- Pharyngeal- Thin Teaspoon Reduced pharyngeal peristalsis;Reduced epiglottic inversion;Reduced anterior laryngeal mobility;Reduced laryngeal elevation;Reduced airway/laryngeal closure;Reduced tongue base retraction;Pharyngeal residue - valleculae;Penetration/Aspiration during swallow;Delayed swallow initiation-vallecula;Compensatory strategies attempted (with notebox) Pharyngeal Material enters airway, remains ABOVE vocal cords and not ejected out Pharyngeal- Thin Cup Reduced pharyngeal peristalsis;Reduced epiglottic inversion;Reduced anterior laryngeal mobility;Reduced laryngeal elevation;Reduced airway/laryngeal closure;Reduced tongue base retraction;Pharyngeal residue - valleculae;Delayed swallow initiation-pyriform sinuses;Penetration/Aspiration before swallow;Penetration/Aspiration during swallow;Compensatory strategies attempted (with notebox) Pharyngeal Material enters airway, passes BELOW cords without attempt by patient to eject out (silent aspiration) Pharyngeal- Thin Straw -- Pharyngeal -- Pharyngeal- Puree Delayed swallow initiation-vallecula;Reduced pharyngeal peristalsis;Reduced epiglottic inversion;Reduced anterior laryngeal mobility;Reduced laryngeal elevation;Reduced airway/laryngeal closure;Reduced tongue base retraction;Pharyngeal residue - valleculae Pharyngeal -- Pharyngeal- Mechanical Soft -- Pharyngeal -- Pharyngeal- Regular -- Pharyngeal -- Pharyngeal- Multi-consistency -- Pharyngeal -- Pharyngeal- Pill -- Pharyngeal -- Pharyngeal Comment --  CHL IP CERVICAL  ESOPHAGEAL PHASE 02/08/2018 Cervical Esophageal Phase Impaired Pudding Teaspoon -- Pudding Cup -- Honey Teaspoon -- Honey Cup Reduced cricopharyngeal relaxation Nectar Teaspoon -- Nectar Cup Reduced cricopharyngeal relaxation Nectar Straw -- Thin Teaspoon Reduced cricopharyngeal relaxation Thin Cup Reduced cricopharyngeal relaxation Thin Straw -- Puree Reduced cricopharyngeal relaxation Mechanical Soft -- Regular -- Multi-consistency -- Pill -- Cervical Esophageal Comment -- No flowsheet data found. [name] 02/08/2018, 3:06 PM  [name], M.Bitania [place]. CCC-SLP [phone]                  Scheduled Meds: . azithromycin  500 mg Oral Daily  . Chlorhexidine Gluconate Cloth  6 each Topical Q0600  . enoxaparin (LOVENOX) injection  30 mg Subcutaneous Q24H  . feeding supplement (ENSURE ENLIVE)  237 mL Oral TID BM  . folic acid  1 mg Oral Daily  . guaiFENesin  600 mg Oral BID  . lipase/protease/amylase  12,000 Units Oral TID AC & HS  . mouth rinse  15 mL Mouth Rinse BID  . mirtazapine  30 mg Oral QHS  . multivitamin with minerals  1 tablet Oral Daily  . mupirocin ointment  1 application Nasal BID  . pantoprazole  20 mg Oral BID  . thiamine  100 mg Oral Daily   Continuous Infusions: . cefTRIAXone (ROCEPHIN)  IV Stopped (02/08/18 1736)  . lactated ringers    . magnesium sulfate 1 - 4 g bolus IVPB       LOS: 3 days    Time spent: over 30 min    Fayrene Helper, MD [facility] Pager [phone]   If 7PM-7AM, please contact night-coverage [email or web address] Password [email or web address] 02/09/2018, 3:27 PM

## 2018-02-09 NOTE — Care Management Note (Addendum)
Case Management Note  Patient Details  Name: Russell Williamson MRN: [id number] Date of Birth: Feb 27, 1956  Subjective/Objective: from home with niece , per pt eval rec HHPT/HHOT, rolling walker and tub seat he will also need HHRN.  NCM offered choice to patient he chose [facility] , referral made to [facility] with [facility]. Soc will begin 24-48 hrs post dc.                   Action/Plan: DC home when ready.   Expected Discharge Date:                  Expected Discharge Plan:  [place]  In-House Referral:     Discharge planning Services  CM Consult  Post Acute Care Choice:  Durable Medical Equipment, Home Health Choice offered to:  Patient  DME Arranged:  Shower stool, Walker rolling DME Agency:  [place] Arranged:  RN, PT, Disease Management, OT [place] Agency:  [place]  Status of Service:     If discussed at [place] of Stay Meetings, dates discussed:    Additional Comments:  Zenon Mayo, RN 02/09/2018, 1:14 PM

## 2018-02-10 DIAGNOSIS — A419 Sepsis, unspecified organism: Secondary | ICD-10-CM | POA: Insufficient documentation

## 2018-02-10 LAB — COMPREHENSIVE METABOLIC PANEL
ALK PHOS: 60 U/L (ref 38–126)
ALT: 73 U/L — AB (ref 17–63)
ANION GAP: 9 (ref 5–15)
AST: 92 U/L — ABNORMAL HIGH (ref 15–41)
Albumin: 2.3 g/dL — ABNORMAL LOW (ref 3.5–5.0)
BUN: 8 mg/dL (ref 6–20)
CHLORIDE: 94 mmol/L — AB (ref 101–111)
CO2: 32 mmol/L (ref 22–32)
Calcium: 8.4 mg/dL — ABNORMAL LOW (ref 8.9–10.3)
Creatinine, Ser: 0.42 mg/dL — ABNORMAL LOW (ref 0.61–1.24)
GFR calc non Af Amer: >60 mL/min (ref >60)
GLUCOSE: 125 mg/dL — AB (ref 65–99)
Potassium: 4.1 mmol/L (ref 3.5–5.1)
SODIUM: 135 mmol/L (ref 135–145)
Total Bilirubin: 0.5 mg/dL (ref 0.3–1.2)
Total Protein: 5.5 g/dL — ABNORMAL LOW (ref 6.5–8.1)

## 2018-02-10 LAB — CULTURE, RESPIRATORY W GRAM STAIN: Special Requests: NORMAL

## 2018-02-10 LAB — CULTURE, RESPIRATORY

## 2018-02-10 LAB — CBC
HCT: 33.6 % — ABNORMAL LOW (ref 39.0–52.0)
HEMOGLOBIN: 11.1 g/dL — AB (ref 13.0–17.0)
MCH: 31.4 pg (ref 26.0–34.0)
MCHC: 33 g/dL (ref 30.0–36.0)
MCV: 95.2 fL (ref 78.0–100.0)
PLATELETS: 204 10*3/uL (ref 150–400)
RBC: 3.53 MIL/uL — AB (ref 4.22–5.81)
RDW: 17.2 % — ABNORMAL HIGH (ref 11.5–15.5)
WBC: 4 10*3/uL (ref 4.0–10.5)

## 2018-02-10 LAB — GLUCOSE, CAPILLARY: Glucose-Capillary: 260 mg/dL — ABNORMAL HIGH (ref 65–99)

## 2018-02-10 LAB — MAGNESIUM: Magnesium: 1.6 mg/dL — ABNORMAL LOW (ref 1.7–2.4)

## 2018-02-10 MED ORDER — SULFAMETHOXAZOLE-TRIMETHOPRIM 800-160 MG PO TABS
1.0000 | ORAL_TABLET | Freq: Two times a day (BID) | ORAL | Status: DC
  Administered 2018-02-10: 1 via ORAL
  Filled 2018-02-10: qty 1

## 2018-02-10 MED ORDER — MAGNESIUM SULFATE 2 GM/50ML IV SOLN
2.0000 g | Freq: Once | INTRAVENOUS | Status: AC
  Administered 2018-02-10: 2 g via INTRAVENOUS
  Filled 2018-02-10: qty 50

## 2018-02-10 MED ORDER — RESOURCE THICKENUP CLEAR PO POWD: ORAL | 1 refills | Status: AC

## 2018-02-10 MED ORDER — ENSURE ENLIVE PO LIQD: 237.0000 mL | Freq: Three times a day (TID) | ORAL | 12 refills | Status: DC

## 2018-02-10 MED ORDER — MAGNESIUM 400 MG PO TABS: 400.0000 mg | ORAL_TABLET | Freq: Two times a day (BID) | ORAL | 0 refills | Status: AC

## 2018-02-10 MED ORDER — SULFAMETHOXAZOLE-TRIMETHOPRIM 800-160 MG PO TABS: 1.0000 | ORAL_TABLET | Freq: Two times a day (BID) | ORAL | 0 refills | Status: DC

## 2018-02-10 NOTE — Progress Notes (Signed)
  Speech Language Pathology Treatment: Dysphagia  Patient Details Name: Russell Williamson MRN: [id number] DOB: 10-Oct-1955 Today's Date: 02/10/2018 Time: 5631-4970 SLP Time Calculation (min) (ACUTE ONLY): 19 min  Assessment / Plan / Recommendation Clinical Impression  SLP provided skilled observation during breakfast meal, with pt continuing to require Mod cues to use volitional cough post-swallow. This strategy is particularly important with liquid intake. SLP provided reinforcement about rationale for strategies and the need for thorough oral care. Although his cough appears to be weak, it was effective at decreasing penetration on MBS. All questions were answered for now. Recommend HH SLP f/u upon d/c given persistent risk for aspiration in pt with chronic dysphagia. He will need additional education and reinforcement of strategies.   HPI HPI: Pt is a 62 y.o. male admitted s/p fall (questionable LOC); CXR showed RLL PNA, CT Head negative for acute changes. 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        SLP Plan  Continue with current plan of care       Recommendations  Diet recommendations: Dysphagia 1 (puree);Nectar-thick liquid(tsp water btwn meals, after oral care) Liquids provided via: Cup;No straw Medication Administration: Crushed with puree Supervision: Patient able to self feed;Full supervision/cueing for compensatory strategies Compensations: Slow rate;Small sips/bites;Hard cough after swallow;Multiple dry swallows after each bite/sip Postural Changes and/or Swallow Maneuvers: Seated upright 90 degrees;Upright 30-60 min after meal                Oral Care  Recommendations: Oral care BID Follow up Recommendations: Home health SLP SLP Visit Diagnosis: Dysphagia, pharyngoesophageal phase (R13.14) Plan: Continue with current plan of care       GO                [name] 02/10/2018, 10:51 AM  [name], M.A. CCC-SLP [phone]

## 2018-02-10 NOTE — Discharge Summary (Signed)
Physician Discharge Summary  Russell Williamson PZW:[id number] DOB: [birth year] DOA: 02/06/2018  PCP: Harvie Junior, MD  Admit date: 02/06/2018 Discharge date: 02/10/2018  Time spent: 40 minutes  Recommendations for Outpatient Follow-up:  1. Follow up outpatient CBC/CMP and magnesium  2. Ensure completion of bactrim for pneumonia 3. Seen by speech, who noted moderate pharyngeal dysphagia suspect related to hx of radiation treatment for throat cancer.  Recommended dysphagia 1 diet with nectar thick liquids.  SLP to follow in home environment.  Continue to follow up outpatient. 4. Weight loss, maybe related to above.  Pt notes improved ability to take PO with some of modifications made here.  Would continue to follow up weight loss outpatient and w/u additionally as indicated.  Consider arranging return follow up with oncology. 5. Follow up acute hepatitis panel and liver enzymes 6. Follow up repeat CXR in 3-4 weeks to ensure resolution with treatment of PNA.  He had pleural effusion here.  If persistent, consider thora.    Discharge Diagnoses:  Active Problems:   History of cancer tonsil   HTN (hypertension)   Hypothyroidism   Malnutrition of moderate degree (HCC)   Anemia in chronic illness   COPD with chronic bronchitis (HCC)   CAP (community acquired pneumonia)   Syncope   Hyponatremia   Dehydration   Fall at home, initial encounter   Tobacco abuse   Pressure injury of skin   Discharge Condition: stable  Diet recommendation: dysphagia 1 diet with nectar thick liquids  Filed Weights   02/07/18 1423  Weight: 40.2 kg (88 lb 9.6 oz)    History of present illness:   Russell Williamson 62 y.o.malewith medical history significant of throat cancer,history of homelessness,anemia, osteoarthritis, cervical adenopathy, chronic lower back pain, chronic pancreatitis, constipation, chronic dyspnea, GERD, COPD, HTN. Patient was admitted with pneumonia.   He was treated for  pneumonia.  He was discharged on bactrim for sputum cx growing MRSA.  He was seen by speech therapy who recommended dysphagia 1 diet and nectar thick liquids.  He was noted to be cachetic with weight loss.  He was tolerating better PO on day or discharge with modified diet.   He had CT scan of his chest, abdomen, pelvis and neck which where not notable for findings of recurrent malignancy.  See below for additional details.    Hospital Course:  Community acquired pneumonia: Possibly related to aspiration -Received ceftriaxone/azithromycin during admission -blood cx NGTD -Sputum cx grew MRSA.  Sensitive to bactrim.  Discharged on bactrim.    Dysphagia:  Pt with history of throat cancer, followed by Dr. Alvy Bimler.  Recent CT scan at presentation with "diffuse infiltration of soft tissue planes in cervical region" (possibly 2/2 radiation therapy vs recurrent tumor).  - Repeat soft tissue neck CT with stable post treatment changes in the neck with no evidence of recurrent disease - dysphagia 1 diet, nectar thick.  Discussed risks of aspiration with patient, which he understands.  - appreciate speech therapy recs  Hypertension: -resume home meds  COPD: -This is stable.  Hyponatremia: -improved at discharge  Volume depletion: -  improved  Cachexia: -Patient has history of throat cancer. -Per chart review (see weights below), he's lost about 6-7 kg over past year (he told previous provider he weighed 135 lbs at one point, but not sure when this was) - would continue to follow weight loss and work up additionally outpatient as appropriate (this morning he noted feeling like he was eating better and able to  tolerate more this this diet here) - consider following back up with oncology  Wt Readings from Last 3 Encounters:  02/07/18 40.2 kg (88 lb 9.6 oz)  07/02/17 42.9 kg (94 lb 9.6 oz)  04/13/17 43.1 kg (95 lb)       Elevated liver enzymes: improving, negative HIV.  Follow acute  hepatitis panel.  CT abdomen/pelvis at presentation with grossly unremarkable liver.  Hx cholecystectomy.   Hypomagnesemia: replete and follow   Elevated troponin: noted on presentation, suspect demand in setting of above  Procedures:  none   Consultations:  none  Discharge Exam: Vitals:   02/10/18 0024 02/10/18 0929  BP: 138/79 99/61  Pulse: 69 61  Resp: 20   Temp: 97.9 F (36.6 C) (!) 97.5 F (36.4 C)  SpO2: 93% 91%   Feels better. Feels like he's eating better.  General: No acute distress. Cardiovascular: Heart sounds show Korina Tretter regular rate, and rhythm. No gallops or rubs. No murmurs. No JVD. Lungs: Clear to auscultation bilaterally with good air movement. No rales, rhonchi or wheezes. Abdomen: Soft, nontender, nondistended with normal active bowel sounds. No masses. No hepatosplenomegaly. Neurological: Alert and oriented 3. Moves all extremities 4. Cranial nerves II through XII grossly intact. Skin: Warm and dry. No rashes or lesions. Extremities: No clubbing or cyanosis. No edema.  Psychiatric: Mood and affect are normal. Insight and judgment are appropriate.  Discharge Instructions   Discharge Instructions    Call MD for:  difficulty breathing, headache or visual disturbances   Complete by:  As directed    Call MD for:  extreme fatigue   Complete by:  As directed    Call MD for:  persistant dizziness or light-headedness   Complete by:  As directed    Call MD for:  persistant nausea and vomiting   Complete by:  As directed    Call MD for:  severe uncontrolled pain   Complete by:  As directed    Call MD for:  temperature >100.4   Complete by:  As directed    DIET - DYS 1   Complete by:  As directed    Fluid consistency:  Nectar Thick   Discharge instructions   Complete by:  As directed    You were seen for pneumonia.  You improved with antibiotics.  We changed your antibiotics on the day of discharge as you were growing MRSA from your sputum culture.    You have lost weight over the past year.  We made some changes to your diet that seem to have improved your ability to tolerate eating.  Please continue this at home (see below).  Continue the compensatory strategies that speech therapy recommended.  Eat slowly with small sips/bites.  Cough after each swallow.  Swallow multiple times after each bite or sip. Sit upright when eating.  See below for additional recommendations.    We're sending you with home health services.   I'm starting you on magnesium, your magnesium has been Debbrah Sampedro bit low here.  Follow up repeat labs as an outpatient.   Please follow up with your PCP in [place] few days to follow up your pneumonia and your weight loss.    You had elevated liver enzymes.  We sent some labs that should be followed up with your PCP as well.  Return for new, recurrent, or worsening symptoms.  Please ask your PCP to request records from this hospitalization so they know what was done and what the next steps will be.  Speech Therapy Diet Recommendations:  Dysphagia 1 (Puree) solids and nectar thick liquid (tsp thin water in between meals after oral care) Liquid Administration via: Cup; No straw Medication Administration: Crushed medications with puree Supervision: Patient able to self feed.  Full supervision/cueing for compensatory strategies Compensations: Slow rate.  Small sips/bites.  Hard cough after swallow.  Multiple dry swallows after each bite/sip. Postural Changes: Remain semi-upright after after feeds/meals (Comment);Seated upright at 90 degrees Oral Care Recommendations: Oral care QID Other Recommendations: Have oral suction available.  Order thickener from pharmacy.  Prohibited food (jello, ice cream, thin soups).  Remove water pitcher.   Increase activity slowly   Complete by:  As directed      Allergies as of 02/10/2018   No Known Allergies     Medication List    TAKE these medications   amLODipine 2.5 MG tablet Commonly known  as:  NORVASC Take 2.5 mg by mouth daily.   feeding supplement (ENSURE ENLIVE) Liqd Take 237 mLs by mouth 3 (three) times daily between meals.   folic acid 1 MG tablet Commonly known as:  FOLVITE Take 1 mg by mouth daily.   lipase/protease/amylase 12000 units Cpep capsule Commonly known as:  CREON Take 1 capsule by mouth 4 (four) times daily.   Magnesium 400 MG Tabs Take 400 mg by mouth 2 (two) times daily.   mirtazapine 30 MG tablet Commonly known as:  REMERON Take 30 mg by mouth at bedtime.   oxyCODONE 15 MG immediate release tablet Commonly known as:  ROXICODONE Take 15 mg by mouth every 4 (four) hours as needed for pain.   pantoprazole 20 MG tablet Commonly known as:  PROTONIX Take 1 tablet (20 mg total) by mouth 2 (two) times daily.   PROAIR HFA 108 (90 Base) MCG/ACT inhaler Generic drug:  albuterol Inhale 2 puff into lungs every 4-6 hours as needed for shortness of breath/wheezing   promethazine 25 MG tablet Commonly known as:  PHENERGAN Take 1 tablet (25 mg total) by mouth every 6 (six) hours as needed for nausea or vomiting.   [place] Add to liquids to make nectar thick consistency for dysphagia   sulfamethoxazole-trimethoprim 800-160 MG tablet Commonly known as:  BACTRIM DS,SEPTRA DS Take 1 tablet by mouth 2 (two) times daily for 7 days.   temazepam 30 MG capsule Commonly known as:  RESTORIL Take 30 mg by mouth at bedtime as needed for sleep.            Durable Medical Equipment  (From admission, onward)        Start     Ordered   02/10/18 1435  For home use only DME Walker rolling  [facility])  Once    Question:  Patient needs Chika Cichowski walker to treat with the following condition  Answer:  Cachexia ([place])   02/10/18 1438   02/10/18 1435  DME tub bench  Once     02/10/18 1438   02/09/18 1311  For home use only DME Walker rolling  Once    Question:  Patient needs Ronda Rajkumar walker to treat with the following condition  Answer:  Weakness    02/09/18 1311   02/09/18 1311  For home use only DME Shower stool  Once     02/09/18 1311     No Known Allergies Follow-up Information    Harvie Junior, MD Follow up.   Specialty:  Family Medicine Why:  Please schedule follow up within the next few days Contact  information: [place] [address] [phone]            The results of significant diagnostics from this hospitalization (including imaging, microbiology, ancillary and laboratory) are listed below for reference.    Significant Diagnostic Studies: Dg Chest 2 View  Result Date: 02/06/2018 CLINICAL DATA:  Hypoxia, history of hypertension, COPD is seizures. Tobacco use since age 54. EXAM: CHEST - 2 VIEW COMPARISON:  07/01/2017 FINDINGS: Emphysematous hyperinflation of the lungs with new right small to moderate pleural effusion. Reverse cephalization and engorgement of pulmonary vasculature to the lower lobes likely on the basis of COPD. Slightly more confluent airspace opacity at the right lung base cannot exclude the possibility of pneumonia. Heart and mediastinal contours are within normal limits. Remote right-sided sixth rib fracture. IMPRESSION: COPD with new small to moderate sized right pleural effusion with reversed cephalization of pulmonary vasculature to the lower lobes likely on the basis of upper lobe COPD. More confluent opacities at the right lung base raise concern for right lower lobe pneumonia and/or atelectasis. Electronically Signed   By: Ashley Royalty M.D.   On: 02/06/2018 17:53   Ct Head Wo Contrast  Result Date: 02/06/2018 CLINICAL DATA:  [place] this afternoon striking head, loss of consciousness, history of tonsillar cancer, COPD, GERD, hypertension, smoker EXAM: CT HEAD WITHOUT CONTRAST CT CERVICAL SPINE WITHOUT CONTRAST TECHNIQUE: Multidetector CT imaging of the head and cervical spine was performed following the standard protocol without intravenous contrast. Multiplanar CT image  reconstructions of the cervical spine were also generated. COMPARISON:  CT soft tissue neck 04/23/2016 FINDINGS: CT HEAD FINDINGS Brain: Normal ventricular morphology. No midline shift or mass effect. Normal appearance of brain parenchyma. No intracranial hemorrhage, mass lesion, or evidence of acute infarction. No extra-axial fluid collections. Vascular: Mild atherosclerotic calcification of internal carotid arteries bilaterally at skull base Skull: Calvaria intact Sinuses/Orbits: Mucosal thickening LEFT maxillary sinus. Remaining visualized paranasal sinuses clear Other: N/Ellary Casamento CT CERVICAL SPINE FINDINGS Alignment: Normal Skull base and vertebrae: Osseous mineralization normal. Visualized skull base intact. Vertebral body and disc space heights maintained. No fracture, subluxation or bone destruction. Soft tissues and spinal canal: Prevertebral soft tissues are mildly prominent with fluid versus mucous in the hypopharynx. Scattered atherosclerotic calcifications of the carotid and vertebral arteries. Diffuse infiltration of soft tissue planes in the parapharyngeal region, which may be due to prior radiation therapy or tumor, suboptimally assessed by noncontrast exam, appears more pronounced than on priors contrast-enhanced CT soft tissue neck study. Narrowing of airway at the larynx and supraglottic region. Suboptimal assessment for adenopathy due to loss of tissue planes. Disc levels: Broad-based disc herniation at C6-C7 indenting thecal sac. Upper chest: Lung apices appear emphysematous but clear. Other: N/Aryanna Shaver IMPRESSION: No acute intracranial abnormalities.1 No acute cervical spine abnormalities. Emphysematous changes at lung apices. Fluid/mucous within the hypopharynx with narrowing of the airway at the level of the larynx and supraglottic region. Diffuse infiltration of soft tissue planes in the cervical region which may be related to prior radiation therapy for tonsillar cancer though recurrent tumor could cause  Ezelle Surprenant similar appearance; recommend follow-up dedicated CT soft tissue neck imaging with contrast when clinical condition permits to further assess. Electronically Signed   By: Lavonia Dana M.D.   On: 02/06/2018 17:35   Ct Soft Tissue Neck W Contrast  Result Date: 02/09/2018 CLINICAL DATA:  62 y/o M; history of tonsillar cancer post radiation therapy. EXAM: CT NECK WITH CONTRAST TECHNIQUE: Multidetector CT imaging of the neck was performed  using the standard protocol following the bolus administration of intravenous contrast. CONTRAST:  14mL OMNIPAQUE IOHEXOL 300 MG/ML  SOLN COMPARISON:  04/23/2016 CT of the neck. 02/06/2018 CT of cervical spine. FINDINGS: Pharynx and larynx: No nodular mucosal enhancement of the oropharynx or tonsils to suggest recurrent disease. Mild smooth low-attenuation mucosal thickening of the oropharynx compatible chronic post radiation changes. No new exophytic mass or abnormal nodular enhancement of the aerodigestive tract. Salivary glands: Left submandibular gland resection. No inflammation, mass, or stone of residual salivary glands. Thyroid: Normal. Lymph nodes: Left neck dissection. No mass within the resection bed identified. No lymphadenopathy by imaging criteria, lymph node necrosis, or abnormal enhancement. Vascular: Diffuse irregularity of the carotid systems multiple areas of mild less than 50% stenosis compatible with atherosclerotic and post radiation changes. 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 Limited intracranial: Negative. Visualized orbits: Negative. Mastoids and visualized paranasal sinuses: Moderate left maxillary sinus mucosal thickening, small fluid level, and chronic inflammatory changes of the wall of the sinus. Paranasal sinuses and mastoid air cells are otherwise normally aerated. Skeleton: Mild cervical spondylosis. No high-grade bony canal stenosis. No lytic or blastic lesion. Upper chest: Moderate emphysema.  Small right pleural  effusion. Other: None. IMPRESSION: 1. Stable post treatment changes in the neck. No evidence of recurrent disease. No lymphadenopathy. 2. Stable chronic findings as above. Electronically Signed   By: Kristine Garbe M.D.   On: 02/09/2018 18:39   Ct Angio Chest Pe W Or Wo Contrast  Result Date: 02/07/2018 CLINICAL DATA:  62 year old male with shortness of breath. History of pancreatitis and throat cancer. Weight loss. EXAM: CT ANGIOGRAPHY CHEST CT ABDOMEN AND PELVIS WITH CONTRAST TECHNIQUE: Multidetector CT imaging of the chest was performed using the standard protocol during bolus administration of intravenous contrast. Multiplanar CT image reconstructions and MIPs were obtained to evaluate the vascular anatomy. Multidetector CT imaging of the abdomen and pelvis was performed using the standard protocol during bolus administration of intravenous contrast. CONTRAST:  34mL ISOVUE-370 IOPAMIDOL (ISOVUE-370) INJECTION 76% COMPARISON:  CT of the abdomen pelvis dated 07/11/2016 and radiograph dated 03/10/2017 FINDINGS: Evaluation is limited due to streak artifact caused by patient's arms and overlying metallic support device. Evaluation is also limited due to anasarca and cachexia. CTA CHEST FINDINGS Cardiovascular: There is mild cardiomegaly. No pericardial effusion. There is coronary vascular calcification primarily involving the LAD. The aorta is poorly visualized and suboptimally opacified but grossly unremarkable. Evaluation of the pulmonary arteries is limited due to suboptimal enhancement of the peripheral branches. Apparent areas of decreased enhancement in the periphery of the distal branches of the right lower lobe (series 9 images 186-191) likely artifactual and related to volume averaging artifact with adjacent bronchi as well as poor contrast opacification. No definite CT evidence of pulmonary embolism. Mediastinum/Nodes: There is fullness of the right hilum measuring 15 mm in short axis.  Ill-defined soft tissue density extending from the hila along the bronchi are not well evaluated but likely thickened bronchial wall with endobronchial debris. Evaluation of the mediastinum and hila is very limited. No mediastinal fluid collection. The esophagus is not well visualized. Lungs/Pleura: There are extensive consolidative changes of the lung bases and lower lobes progressed compared to the prior CT. There is diffuse areas of nodularity is at the lung bases. Diffuse thickening of the bronchial walls with mucous impaction involving the lower lobes. There is mucous impaction of the right lower lumbar bronchus. There is narrowing of the central bronchi likely secondary to edema. There  is Juwon Scripter small to moderate right and trace left pleural effusion. There is Vilma Will background of emphysema. Minimal amount of air in the left apical pleural surface noted, appears chronic and seen on the CT of the cervical spine dated 02/06/2018. No pneumothorax. Musculoskeletal: Diffuse soft tissue edema and anasarca. There is loss of subcutaneous fat and cachexia. No acute osseous pathology. Extensive right chest wall venous collaterals possibly related to Lyrick Worland degree of SVC occlusion. Review of the MIP images confirms the above findings. CT ABDOMEN and PELVIS FINDINGS No intra-abdominal free air or free fluid. Hepatobiliary: The liver is grossly unremarkable. No intrahepatic biliary ductal dilatation. Cholecystectomy. Pancreas: Changes of chronic pancreatitis with dystrophic calcification and dilatation of the main pancreatic duct. Evaluation of the pancreas is limited due to suboptimal visualization. Spleen: Normal in size without focal abnormality. Adrenals/Urinary Tract: The adrenal glands are unremarkable. Small right renal cyst. The kidneys, kidneys and urinary bladder appear unremarkable. Stomach/Bowel: Oral contrast mixed with stool noted throughout the colon. There is no bowel dilatation or evidence of obstruction. The appendix is  not identified with certainty. Vascular/Lymphatic: Extensive aortoiliac atherosclerotic disease. No portal venous gas. Evaluation for adenopathy is limited due to diffuse edema and anasarca as well as paucity of abdominal fat. Reproductive: The prostate and seminal vesicles are grossly unremarkable. Other: Diffuse edema and anasarca as well as cachexia. Musculoskeletal: No acute osseous pathology.  Osteopenia. Review of the MIP images confirms the above findings. IMPRESSION: 1. No definite CT evidence of pulmonary embolism. 2. Diffuse thickening of the bronchial wall with endobronchial mucous content predominantly involving the lower lobes. Large areas of airspace opacity involving the lower lobes and lung bases bilaterally, right greater left most consistent with pneumonia possibly related to aspiration. Clinical correlation is recommended. Bilateral pleural effusions, right greater left. 3. No definite acute intra-abdominal or pelvic pathology. 4. Diffuse soft tissue edema and anasarca.  Cachexia. Electronically Signed   By: Anner Crete M.D.   On: 02/07/2018 02:40   Ct Cervical Spine Wo Contrast  Result Date: 02/06/2018 CLINICAL DATA:  [place] this afternoon striking head, loss of consciousness, history of tonsillar cancer, COPD, GERD, hypertension, smoker EXAM: CT HEAD WITHOUT CONTRAST CT CERVICAL SPINE WITHOUT CONTRAST TECHNIQUE: Multidetector CT imaging of the head and cervical spine was performed following the standard protocol without intravenous contrast. Multiplanar CT image reconstructions of the cervical spine were also generated. COMPARISON:  CT soft tissue neck 04/23/2016 FINDINGS: CT HEAD FINDINGS Brain: Normal ventricular morphology. No midline shift or mass effect. Normal appearance of brain parenchyma. No intracranial hemorrhage, mass lesion, or evidence of acute infarction. No extra-axial fluid collections. Vascular: Mild atherosclerotic calcification of internal carotid arteries bilaterally  at skull base Skull: Calvaria intact Sinuses/Orbits: Mucosal thickening LEFT maxillary sinus. Remaining visualized paranasal sinuses clear Other: N/Spencer Cardinal CT CERVICAL SPINE FINDINGS Alignment: Normal Skull base and vertebrae: Osseous mineralization normal. Visualized skull base intact. Vertebral body and disc space heights maintained. No fracture, subluxation or bone destruction. Soft tissues and spinal canal: Prevertebral soft tissues are mildly prominent with fluid versus mucous in the hypopharynx. Scattered atherosclerotic calcifications of the carotid and vertebral arteries. Diffuse infiltration of soft tissue planes in the parapharyngeal region, which may be due to prior radiation therapy or tumor, suboptimally assessed by noncontrast exam, appears more pronounced than on priors contrast-enhanced CT soft tissue neck study. Narrowing of airway at the larynx and supraglottic region. Suboptimal assessment for adenopathy due to loss of tissue planes. Disc levels: Broad-based disc herniation at C6-C7 indenting thecal sac.  Upper chest: Lung apices appear emphysematous but clear. Other: N/Kareem Cathey IMPRESSION: No acute intracranial abnormalities.1 No acute cervical spine abnormalities. Emphysematous changes at lung apices. Fluid/mucous within the hypopharynx with narrowing of the airway at the level of the larynx and supraglottic region. Diffuse infiltration of soft tissue planes in the cervical region which may be related to prior radiation therapy for tonsillar cancer though recurrent tumor could cause Carrington Mullenax similar appearance; recommend follow-up dedicated CT soft tissue neck imaging with contrast when clinical condition permits to further assess. Electronically Signed   By: Lavonia Dana M.D.   On: 02/06/2018 17:35   Ct Abdomen Pelvis W Contrast  Result Date: 02/07/2018 CLINICAL DATA:  62 year old male with shortness of breath. History of pancreatitis and throat cancer. Weight loss. EXAM: CT ANGIOGRAPHY CHEST CT ABDOMEN AND  PELVIS WITH CONTRAST TECHNIQUE: Multidetector CT imaging of the chest was performed using the standard protocol during bolus administration of intravenous contrast. Multiplanar CT image reconstructions and MIPs were obtained to evaluate the vascular anatomy. Multidetector CT imaging of the abdomen and pelvis was performed using the standard protocol during bolus administration of intravenous contrast. CONTRAST:  8mL ISOVUE-370 IOPAMIDOL (ISOVUE-370) INJECTION 76% COMPARISON:  CT of the abdomen pelvis dated 07/11/2016 and radiograph dated 03/10/2017 FINDINGS: Evaluation is limited due to streak artifact caused by patient's arms and overlying metallic support device. Evaluation is also limited due to anasarca and cachexia. CTA CHEST FINDINGS Cardiovascular: There is mild cardiomegaly. No pericardial effusion. There is coronary vascular calcification primarily involving the LAD. The aorta is poorly visualized and suboptimally opacified but grossly unremarkable. Evaluation of the pulmonary arteries is limited due to suboptimal enhancement of the peripheral branches. Apparent areas of decreased enhancement in the periphery of the distal branches of the right lower lobe (series 9 images 186-191) likely artifactual and related to volume averaging artifact with adjacent bronchi as well as poor contrast opacification. No definite CT evidence of pulmonary embolism. Mediastinum/Nodes: There is fullness of the right hilum measuring 15 mm in short axis. Ill-defined soft tissue density extending from the hila along the bronchi are not well evaluated but likely thickened bronchial wall with endobronchial debris. Evaluation of the mediastinum and hila is very limited. No mediastinal fluid collection. The esophagus is not well visualized. Lungs/Pleura: There are extensive consolidative changes of the lung bases and lower lobes progressed compared to the prior CT. There is diffuse areas of nodularity is at the lung bases. Diffuse  thickening of the bronchial walls with mucous impaction involving the lower lobes. There is mucous impaction of the right lower lumbar bronchus. There is narrowing of the central bronchi likely secondary to edema. There is Geroldine Esquivias small to moderate right and trace left pleural effusion. There is Ronne Stefanski background of emphysema. Minimal amount of air in the left apical pleural surface noted, appears chronic and seen on the CT of the cervical spine dated 02/06/2018. No pneumothorax. Musculoskeletal: Diffuse soft tissue edema and anasarca. There is loss of subcutaneous fat and cachexia. No acute osseous pathology. Extensive right chest wall venous collaterals possibly related to Liel Rudden degree of SVC occlusion. Review of the MIP images confirms the above findings. CT ABDOMEN and PELVIS FINDINGS No intra-abdominal free air or free fluid. Hepatobiliary: The liver is grossly unremarkable. No intrahepatic biliary ductal dilatation. Cholecystectomy. Pancreas: Changes of chronic pancreatitis with dystrophic calcification and dilatation of the main pancreatic duct. Evaluation of the pancreas is limited due to suboptimal visualization. Spleen: Normal in size without focal abnormality. Adrenals/Urinary Tract: The adrenal glands  are unremarkable. Small right renal cyst. The kidneys, kidneys and urinary bladder appear unremarkable. Stomach/Bowel: Oral contrast mixed with stool noted throughout the colon. There is no bowel dilatation or evidence of obstruction. The appendix is not identified with certainty. Vascular/Lymphatic: Extensive aortoiliac atherosclerotic disease. No portal venous gas. Evaluation for adenopathy is limited due to diffuse edema and anasarca as well as paucity of abdominal fat. Reproductive: The prostate and seminal vesicles are grossly unremarkable. Other: Diffuse edema and anasarca as well as cachexia. Musculoskeletal: No acute osseous pathology.  Osteopenia. Review of the MIP images confirms the above findings. IMPRESSION:  1. No definite CT evidence of pulmonary embolism. 2. Diffuse thickening of the bronchial wall with endobronchial mucous content predominantly involving the lower lobes. Large areas of airspace opacity involving the lower lobes and lung bases bilaterally, right greater left most consistent with pneumonia possibly related to aspiration. Clinical correlation is recommended. Bilateral pleural effusions, right greater left. 3. No definite acute intra-abdominal or pelvic pathology. 4. Diffuse soft tissue edema and anasarca.  Cachexia. Electronically Signed   By: Anner Crete M.D.   On: 02/07/2018 02:40   Dg Swallowing Func-speech Pathology  Result Date: 02/08/2018 Objective Swallowing Evaluation: Type of Study: MBS-Modified Barium Swallow Study  Patient Details Name: Russell Williamson MRN: [id number] Date of Birth: January 30, 1956 Today's Date: 02/08/2018 Time: SLP Start Time (ACUTE ONLY): 1345 -SLP Stop Time (ACUTE ONLY): 1407 SLP Time Calculation (min) (ACUTE ONLY): 22 min Past Medical History: Past Medical History: Diagnosis Date . Anemia  . Arthritis  . Cervical adenopathy 04/26/2012 . Chronic back pain   lower . Chronic pancreatitis ([place])  . Constipation  . COPD (chronic obstructive pulmonary disease) ([place])  . Daily headache 05/23/2012  "small ones" . Deficiency anemia 04/15/2016 . Dyspnea   with exertion . G tube feedings ([facility])   hx of  feeding tube in stomach . GERD (gastroesophageal reflux disease)  . History of blood transfusion ~ 2004  "from the pancreatitis" . History of radiation therapy 02/02/2011-03/22/2011  head/neck,left tonsil . Hx of radiation therapy 02/02/11 to 03/22/11  L tonsil . Hypertension  . Insomnia  . 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 Pancytopenia ([place]) 02/28/2014 . Poor venous access 04/28/2016 . 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 Thrush 11/02/2013 . 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 Urinary hesitancy  Past Surgical History: Past Surgical  History: Procedure Laterality Date . BILE DUCT STENT PLACEMENT    hx of . CHOLECYSTECTOMY  04/2005 . DIRECT LARYNGOSCOPY  05/23/2012  Procedure: DIRECT LARYNGOSCOPY;  Surgeon: Rozetta Nunnery, MD;  Location: [facility];  Service: ENT;  Laterality: N/Keny Donald; . DIRECT LARYNGOSCOPY N/Yehudit Fulginiti 03/23/2013  Procedure: DIRECT LARYNGOSCOPY;  Surgeon: Rozetta Nunnery, MD;  Location: [facility];  Service: ENT;  Laterality: N/Keilee Denman; . ESOPHAGEAL DILATION N/Lewis Grivas 03/23/2013  Procedure: ESOPHAGEAL DILATION;  Surgeon: Rozetta Nunnery, MD;  Location: [facility];  Service: ENT;  Laterality: N/Prezley Qadir; . ESOPHAGOGASTRODUODENOSCOPY (EGD) WITH PROPOFOL N/Katharina Jehle 03/01/2017  Procedure: ESOPHAGOGASTRODUODENOSCOPY (EGD) WITH PROPOFOL;  Surgeon: Ladene Artist, MD;  [facility];  Service: Endoscopy;  Laterality: N/Adem Costlow; . FLEXIBLE SIGMOIDOSCOPY N/Carollyn Etcheverry 03/01/2017  Procedure: FLEXIBLE SIGMOIDOSCOPY;  Surgeon: Ladene Artist, MD;  [facility];  Service: Endoscopy;  Laterality: N/Jaquasha Carnevale; . IR GENERIC HISTORICAL  04/29/2016  IR US GUIDE VASC ACCESS RIGHT 04/29/2016 [facility] . IR GENERIC HISTORICAL  04/29/2016  IR FLUORO GUIDE CV LINE RIGHT 04/29/2016 [facility] . IR GENERIC HISTORICAL  05/12/2016  IR US GUIDE VASC ACCESS LEFT 05/12/2016 [facility] . IR GENERIC HISTORICAL  05/12/2016  IR FLUORO GUIDE CV LINE LEFT 05/12/2016 [facility] . IR GENERIC HISTORICAL  06/28/2016  IR US GUIDE VASC ACCESS RIGHT 06/28/2016 Sandi Mariscal, MD [facility] . IR GENERIC HISTORICAL  06/28/2016  IR FLUORO GUIDE PORT INSERTION RIGHT 06/28/2016 Sandi Mariscal, MD [facility] . IR REMOVAL TUN ACCESS W/ PORT W/O FL MOD SED  02/10/2017 . MASS BIOPSY  05/23/2012  Procedure: NECK MASS BIOPSY;  Surgeon: Rozetta Nunnery, MD;  Location: [facility];  Service: ENT;  Laterality: N/Eesha Schmaltz; . RADICAL NECK DISSECTION  05/23/2012  w/mass excision . RADICAL NECK DISSECTION  05/23/2012  Procedure: RADICAL NECK DISSECTION;  Surgeon: Rozetta Nunnery, MD;  Location: [facility];   Service: ENT;  Laterality: Left; . TIBIA FRACTURE SURGERY  1990's  left HPI: Pt is Russell Williamson 62 y.o. male admitted s/p fall (questionable LOC); CXR showed RLL PNA, CT Head negative for acute changes. 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   Subjective: describes having to puree all his food to get it to go down at home Assessment / Plan / Recommendation CHL IP CLINICAL IMPRESSIONS 02/08/2018 Clinical Impression Pt has Russell Williamson moderate pharyngeal dysphagia suspected to be chronic in nature secondary to history of radiation tx for throat cancer. He has minimal hyolaryngeal excursion, epiglottic deflection, base of tongue retraction, and pharyngeal squeeze, resulting in poor swallow efficiency and limited airway closure. Moderate residue remains at the entrance of the laryngeal vestibule and pyriform sinuses with thin and nectar thick liquids, which are silently penetrated/aspiration during the swallow. His cough is not effective at clearing aspirates, but if he performs Laurana Magistro heard cough and re-swallow he can clear most penetrates with cup sips of nectar thick liquids and spoonfuls of thin liquids, as penetration is more shallow. Increasing amounts of residue remain in the valleculae as consistencies become thicker, with honey thick liquids still penetrating into the laryngeal vestibule. Pt is likely at Yamila Cragin high risk for aspiration, malnutrition, and dehydration with any consistencies recommended. In light of his acute PNA would favor Avya Flavell more conservative diet of Dys 1 textures and nectar thick liquids by cup using hard swallows, hard coughs, and hard second swallows. Teaspoons of thin water could be trialed between meals  after oral care using the same strategies. MD may also wish to consider conversation about overall GOC given his likely chronic dysphagia. SLP Visit Diagnosis Dysphagia, pharyngoesophageal phase (R13.14) Attention and concentration deficit following -- Frontal lobe and executive function deficit following -- Impact on safety and function Severe aspiration risk;Moderate aspiration risk;Risk for inadequate nutrition/hydration   CHL IP TREATMENT RECOMMENDATION 02/08/2018 Treatment Recommendations Therapy as outlined in treatment plan below   Prognosis 02/08/2018 Prognosis for Safe Diet Advancement Guarded Barriers to Reach Goals Time post onset;Severity of deficits Barriers/Prognosis Comment -- CHL IP DIET RECOMMENDATION 02/08/2018 SLP Diet Recommendations Dysphagia 1 (Puree) solids;Nectar thick liquid Liquid Administration via Cup;No straw Medication Administration Crushed with puree Compensations Slow rate;Small sips/bites;Hard cough after swallow;Multiple dry swallows after each bite/sip Postural Changes Remain semi-upright after after feeds/meals (Comment);Seated upright at 90 degrees   CHL IP OTHER RECOMMENDATIONS 02/08/2018 Recommended Consults -- Oral Care Recommendations Oral care QID Other Recommendations Have oral  suction available;Order thickener from pharmacy;Prohibited food (jello, ice cream, thin soups);Remove water pitcher   CHL IP FOLLOW UP RECOMMENDATIONS 02/08/2018 Follow up Recommendations (No Data)   CHL IP FREQUENCY AND DURATION 02/08/2018 Speech Therapy Frequency (ACUTE ONLY) min 2x/week Treatment Duration 2 weeks      CHL IP ORAL PHASE 02/08/2018 Oral Phase WFL Oral - Pudding Teaspoon -- Oral - Pudding Cup -- Oral - Honey Teaspoon -- Oral - Honey Cup -- Oral - Nectar Teaspoon -- Oral - Nectar Cup -- Oral - Nectar Straw -- Oral - Thin Teaspoon -- Oral - Thin Cup -- Oral - Thin Straw -- Oral - Puree -- Oral - Mech Soft -- Oral - Regular -- Oral - Multi-Consistency -- Oral - Pill -- Oral Phase - Comment --  CHL  IP PHARYNGEAL PHASE 02/08/2018 Pharyngeal Phase Impaired Pharyngeal- Pudding Teaspoon -- Pharyngeal -- Pharyngeal- Pudding Cup -- Pharyngeal -- Pharyngeal- Honey Teaspoon -- Pharyngeal -- Pharyngeal- Honey Cup Delayed swallow initiation-vallecula;Reduced pharyngeal peristalsis;Reduced epiglottic inversion;Reduced anterior laryngeal mobility;Reduced laryngeal elevation;Reduced airway/laryngeal closure;Reduced tongue base retraction;Pharyngeal residue - valleculae;Penetration/Aspiration during swallow Pharyngeal Material enters airway, remains ABOVE vocal cords and not ejected out Pharyngeal- Nectar Teaspoon -- Pharyngeal -- Pharyngeal- Nectar Cup Reduced pharyngeal peristalsis;Reduced epiglottic inversion;Reduced anterior laryngeal mobility;Reduced laryngeal elevation;Reduced airway/laryngeal closure;Reduced tongue base retraction;Pharyngeal residue - valleculae;Penetration/Aspiration during swallow;Penetration/Aspiration before swallow;Delayed swallow initiation-pyriform sinuses;Compensatory strategies attempted (with notebox) Pharyngeal Material enters airway, remains ABOVE vocal cords and not ejected out Pharyngeal- Nectar Straw -- Pharyngeal -- Pharyngeal- Thin Teaspoon Reduced pharyngeal peristalsis;Reduced epiglottic inversion;Reduced anterior laryngeal mobility;Reduced laryngeal elevation;Reduced airway/laryngeal closure;Reduced tongue base retraction;Pharyngeal residue - valleculae;Penetration/Aspiration during swallow;Delayed swallow initiation-vallecula;Compensatory strategies attempted (with notebox) Pharyngeal Material enters airway, remains ABOVE vocal cords and not ejected out Pharyngeal- Thin Cup Reduced pharyngeal peristalsis;Reduced epiglottic inversion;Reduced anterior laryngeal mobility;Reduced laryngeal elevation;Reduced airway/laryngeal closure;Reduced tongue base retraction;Pharyngeal residue - valleculae;Delayed swallow initiation-pyriform sinuses;Penetration/Aspiration before  swallow;Penetration/Aspiration during swallow;Compensatory strategies attempted (with notebox) Pharyngeal Material enters airway, passes BELOW cords without attempt by patient to eject out (silent aspiration) Pharyngeal- Thin Straw -- Pharyngeal -- Pharyngeal- Puree Delayed swallow initiation-vallecula;Reduced pharyngeal peristalsis;Reduced epiglottic inversion;Reduced anterior laryngeal mobility;Reduced laryngeal elevation;Reduced airway/laryngeal closure;Reduced tongue base retraction;Pharyngeal residue - valleculae Pharyngeal -- Pharyngeal- Mechanical Soft -- Pharyngeal -- Pharyngeal- Regular -- Pharyngeal -- Pharyngeal- Multi-consistency -- Pharyngeal -- Pharyngeal- Pill -- Pharyngeal -- Pharyngeal Comment --  CHL IP CERVICAL ESOPHAGEAL PHASE 02/08/2018 Cervical Esophageal Phase Impaired Pudding Teaspoon -- Pudding Cup -- Honey Teaspoon -- Honey Cup Reduced cricopharyngeal relaxation Nectar Teaspoon -- Nectar Cup Reduced cricopharyngeal relaxation Nectar Straw -- Thin Teaspoon Reduced cricopharyngeal relaxation Thin Cup Reduced cricopharyngeal relaxation Thin Straw -- Puree Reduced cricopharyngeal relaxation Mechanical Soft -- Regular -- Multi-consistency -- Pill -- Cervical Esophageal Comment -- No flowsheet data found. Russell Williamson 02/08/2018, 3:06 PM  Russell Williamson, M.Reet Scharrer. CCC-SLP [phone]              Microbiology: Recent Results (from the past 240 hour(s))  Blood Culture (routine x 2)     Status: None (Preliminary result)   Collection Time: 02/06/18  6:02 PM  Result Value Ref Range Status   Specimen Description BLOOD RIGHT ANTECUBITAL  Final   Special Requests   Final    BOTTLES DRAWN AEROBIC AND ANAEROBIC Blood Culture adequate volume   Culture   Final    NO GROWTH 4 DAYS Performed at [facility], [address], [place], [place] [address]    Report Status PENDING  Incomplete  Blood Culture (routine x 2)  Status: None (Preliminary result)   Collection Time: 02/06/18  6:30  PM  Result Value Ref Range Status   Specimen Description BLOOD LEFT ANTECUBITAL  Final   Special Requests   Final    BOTTLES DRAWN AEROBIC AND ANAEROBIC Blood Culture adequate volume   Culture   Final    NO GROWTH 4 DAYS Performed at [facility], [address], [place], [place] [address]    Report Status PENDING  Incomplete  MRSA PCR Screening     Status: Abnormal   Collection Time: 02/07/18  2:26 AM  Result Value Ref Range Status   MRSA by PCR POSITIVE (Trusten Hume) NEGATIVE Final    Comment:        The GeneXpert MRSA Assay (FDA approved for NASAL specimens only), is one component of [facility] comprehensive MRSA colonization surveillance program. It is not intended to diagnose MRSA infection nor to guide or monitor treatment for MRSA infections. RESULT CALLED TO, READ BACK BY AND VERIFIED WITH: Alyssa Grove RN 02/07/18 0631 JDW Performed at [facility], [address], [place], [place] [address]   Culture, sputum-assessment     Status: None   Collection Time: 02/07/18  5:42 PM  Result Value Ref Range Status   Specimen Description EXPECTORATED SPUTUM  Final   Special Requests Normal  Final   Sputum evaluation   Final    THIS SPECIMEN IS ACCEPTABLE FOR SPUTUM CULTURE Performed at [facility], [address] [place], [place] [address]    Report Status 02/08/2018 FINAL  Final  Culture, respiratory (NON-Expectorated)     Status: None   Collection Time: 02/07/18  5:42 PM  Result Value Ref Range Status   Specimen Description EXPECTORATED SPUTUM  Final   Special Requests Normal Reflexed from [id number]  Final   Gram Stain   Final    ABUNDANT WBC PRESENT,BOTH PMN AND MONONUCLEAR MODERATE GRAM POSITIVE COCCI RARE YEAST FEW SQUAMOUS EPITHELIAL CELLS PRESENT Performed at [facility], [place] [address], [place], [place] [address]    Culture   Final    MODERATE METHICILLIN RESISTANT STAPHYLOCOCCUS AUREUS   Report Status 02/10/2018 FINAL  Final   Organism ID, Bacteria  METHICILLIN RESISTANT STAPHYLOCOCCUS AUREUS  Final      Susceptibility   Methicillin resistant staphylococcus aureus - MIC*    CIPROFLOXACIN >=8 RESISTANT Resistant     ERYTHROMYCIN >=8 RESISTANT Resistant     GENTAMICIN <=0.5 SENSITIVE Sensitive     OXACILLIN >=4 RESISTANT Resistant     TETRACYCLINE <=1 SENSITIVE Sensitive     VANCOMYCIN <=0.5 SENSITIVE Sensitive     TRIMETH/SULFA <=10 SENSITIVE Sensitive     CLINDAMYCIN <=0.25 SENSITIVE Sensitive     RIFAMPIN <=0.5 SENSITIVE Sensitive     Inducible Clindamycin NEGATIVE Sensitive     * MODERATE METHICILLIN RESISTANT STAPHYLOCOCCUS AUREUS     Labs: Basic Metabolic Panel: Recent Labs  Lab 02/06/18 1616 02/06/18 2115 02/07/18 0807 02/09/18 0925 02/10/18 0322  NA 131*  --  136 135 135  K 5.3*  --  4.4 4.0 4.1  CL 88*  --  98* 96* 94*  CO2 31  --  27 28 32  GLUCOSE 86  --  79 116* 125*  BUN 32*  --  18 8 8   CREATININE 0.80  --  0.49* 0.44* 0.42*  CALCIUM 8.9  --  8.4* 8.4* 8.4*  MG  --  1.4* 1.9 1.2* 1.6*  PHOS  --  3.1 2.6  --   --  Liver Function Tests: Recent Labs  Lab 02/06/18 2115 02/07/18 0807 02/09/18 0925 02/10/18 0322  AST 367* 267* 121* 92*  ALT 149* 134* 86* 73*  ALKPHOS 82 74 59 60  BILITOT 1.1 0.6 0.5 0.5  PROT 6.4* 6.0* 5.6* 5.5*  ALBUMIN 2.8* 2.7* 2.4* 2.3*   No results for input(s): LIPASE, AMYLASE in the last 168 hours. No results for input(s): AMMONIA in the last 168 hours. CBC: Recent Labs  Lab 02/06/18 1616 02/07/18 0807 02/09/18 0925 02/10/18 0322  WBC 12.6* 9.2 5.5 4.0  NEUTROABS 10.8*  --   --   --   HGB 12.1* 11.2* 11.3* 11.1*  HCT 35.2* 33.3* 33.5* 33.6*  MCV 96.2 95.1 94.4 95.2  PLT 165 172 175 204   Cardiac Enzymes: Recent Labs  Lab 02/06/18 2115 02/07/18 0300 02/07/18 0807  CKTOTAL 3,190*  --   --   TROPONINI 0.08* 0.06* 0.05*   BNP: BNP (last 3 results) No results for input(s): BNP in the last 8760 hours.  ProBNP (last 3 results) No results for input(s): PROBNP  in the last 8760 hours.  CBG: No results for input(s): GLUCAP in the last 168 hours.     Signed:  Fayrene Helper MD.  [facility] 02/10/2018, 2:45 PM

## 2018-02-10 NOTE — Progress Notes (Signed)
Occupational Therapy Treatment Patient Details Name: Russell Williamson MRN: [id number] DOB: Jan 01, 1956 Today's Date: 02/10/2018    History of present illness Pt is a 62 y.o. male admitted 02/06/18 post-fall with fever and cough; CXR showed pneumonia. Worked up for CAP. Head CT shows no acute intracranial abnormality. PMH includes throat CA (s/p radiation 2012), OA, cervical adenopathy, chronic LBP, chronic pancreatitis, COPD, HTN, h/o homelessness.    OT comments  Pt progressing towards OT goals, presents supine in bed agreeable to OT tx session. Pt completing room level functional mobility, standing toileting and grooming ADLs using RW with minguard throughout. Pt standing >53min during session completion. Educated pt regarding energy conservation and activity progression after return home with pt verbalizing understanding. Pt anticipating d/c home later today.   Follow Up Recommendations  Home health OT    Equipment Recommendations  Tub/shower seat          Precautions / Restrictions Precautions Precautions: Fall Restrictions Weight Bearing Restrictions: No       Mobility Bed Mobility Overal bed mobility: Modified Independent             General bed mobility comments: Increased time and effort  Transfers Overall transfer level: Modified independent Equipment used: Rolling walker (2 wheeled) Transfers: Sit to/from Stand           General transfer comment: supervision for safety; no physical assist needed     Balance Overall balance assessment: Needs assistance   Sitting balance-Leahy Scale: Good       Standing balance-Leahy Scale: Fair Standing balance comment: Can static stand with no UE support                           ADL either performed or assessed with clinical judgement   ADL Overall ADL's : Needs assistance/impaired     Grooming: Wash/dry hands;Wash/dry face;Oral care;Supervision/safety;Standing                        Toileting- Water quality scientist and Hygiene: Min guard;Sit to/from stand Toileting - Clothing Manipulation Details (indicate cue type and reason): pt standing at toilet to void bladder with minguard for safety      Functional mobility during ADLs: Min guard;Rolling walker General ADL Comments: pt completing room level functional mobility, standing toileting, and completing standing grooming ADLs >88min with overall minguard-supervision throughout. Reviewed and educated pt on energy conservation techniques and building activity tolerance after return home.                        Cognition Arousal/Alertness: Awake/alert Behavior During Therapy: Flat affect Overall Cognitive Status: No family/caregiver present to determine baseline cognitive functioning Area of Impairment: Awareness                           Awareness: Emergent                              Pertinent Vitals/ Pain       Pain Assessment: No/denies pain Faces Pain Scale: Hurts a little bit Pain Location: LLE Pain Descriptors / Indicators: Discomfort;Sore Pain Intervention(s): Monitored during session  Frequency  Min 2X/week        Progress Toward Goals  OT Goals(current goals can now be found in the care plan section)  Progress towards OT goals: Progressing toward goals  Acute Rehab OT Goals Patient Stated Goal: Return home OT Goal Formulation: With patient Time For Goal Achievement: 02/22/18 Potential to Achieve Goals: Good  Plan Discharge plan remains appropriate                    AM-PAC PT "6 Clicks" Daily Activity     Outcome Measure   Help from another person eating meals?: None Help from another person taking care of personal grooming?: A Little Help from another person toileting, which includes using toliet, bedpan, or urinal?: A Little Help from another person bathing  (including washing, rinsing, drying)?: A Little Help from another person to put on and taking off regular upper body clothing?: None Help from another person to put on and taking off regular lower body clothing?: A Little 6 Click Score: 20    End of Session Equipment Utilized During Treatment: Gait belt;Rolling walker  OT Visit Diagnosis: Other abnormalities of gait and mobility (R26.89);Muscle weakness (generalized) (M62.81)   Activity Tolerance Patient tolerated treatment well   Patient Left in bed;with call bell/phone within reach   Nurse Communication Mobility status        Time: 1446-1510 OT Time Calculation (min): 24 min  Charges: OT General Charges $OT Visit: 1 Visit OT Treatments $Self Care/Home Management : 8-22 mins  Lou Cal, OT Pager [phone] 02/10/2018    Russell Williamson 02/10/2018, 4:02 PM

## 2018-02-10 NOTE — Progress Notes (Signed)
Physical Therapy Treatment Patient Details Name: Russell Williamson MRN: [id number] DOB: 06-03-1956 Today's Date: 02/10/2018    History of Present Illness Pt is a 62 y.o. male admitted 02/06/18 post-fall with fever and cough; CXR showed pneumonia. Worked up for CAP. Head CT shows no acute intracranial abnormality. PMH includes throat CA (s/p radiation 2012), OA, cervical adenopathy, chronic LBP, chronic pancreatitis, COPD, HTN, h/o homelessness.    PT Comments    Pt progressing well with mobility. Amb 180' with RW and supervision for safety. Mod indep with bed mobility and transfers using RW. Declined stair training, but educ on assist from niece. Pt ready to return home. Will follow acutely.   Follow Up Recommendations  Home health PT;Supervision - Intermittent     Equipment Recommendations  Rolling walker with 5" wheels(RW already delivered to room)    Recommendations for Other Services       Precautions / Restrictions Precautions Precautions: Fall Restrictions Weight Bearing Restrictions: No    Mobility  Bed Mobility Overal bed mobility: Modified Independent             General bed mobility comments: Increased time and effort  Transfers Overall transfer level: Modified independent Equipment used: Rolling walker (2 wheeled) Transfers: Sit to/from Stand           General transfer comment: Stood from bed, toilet, and recliner mod indep with RW  Ambulation/Gait Ambulation/Gait assistance: Supervision Ambulation Distance (Feet): 180 Feet Assistive device: Rolling walker (2 wheeled) Gait Pattern/deviations: Step-through pattern;Decreased stride length;Antalgic Gait velocity: Decreased Gait velocity interpretation: <1.8 ft/sec, indicate of risk for recurrent falls General Gait Details: Improved step length with less antalgic gait pattern noted this session; supervision for safety with RW   Stairs Stairs: (Educ on technique for HHA from niece to ascend 2 steps into  home; pt declining practice)           Wheelchair Mobility    Modified Rankin (Stroke Patients Only)       Balance Overall balance assessment: Needs assistance   Sitting balance-Leahy Scale: Good       Standing balance-Leahy Scale: Fair Standing balance comment: Can static stand with no UE support                            Cognition Arousal/Alertness: Awake/alert Behavior During Therapy: Flat affect Overall Cognitive Status: No family/caregiver present to determine baseline cognitive functioning Area of Impairment: Awareness                           Awareness: Emergent          Exercises      General Comments        Pertinent Vitals/Pain Pain Assessment: Faces Pain Score: 9  Faces Pain Scale: Hurts a little bit Pain Location: LLE Pain Descriptors / Indicators: Discomfort;Sore Pain Intervention(s): Monitored during session    Home Living                      Prior Function            PT Goals (current goals can now be found in the care plan section) Acute Rehab PT Goals Patient Stated Goal: Return home PT Goal Formulation: With patient Time For Goal Achievement: 02/21/18 Potential to Achieve Goals: Good Progress towards PT goals: Progressing toward goals    Frequency    Min 3X/week      PT  Plan Current plan remains appropriate    Co-evaluation              AM-PAC PT "6 Clicks" Daily Activity  Outcome Measure  Difficulty turning over in bed (including adjusting bedclothes, sheets and blankets)?: None Difficulty moving from lying on back to sitting on the side of the bed? : None Difficulty sitting down on and standing up from a chair with arms (e.g., wheelchair, bedside commode, etc,.)?: None Help needed moving to and from a bed to chair (including a wheelchair)?: A Little Help needed walking in hospital room?: A Little Help needed climbing 3-5 steps with a railing? : A Little 6 Click Score:  21    End of Session Equipment Utilized During Treatment: Gait belt Activity Tolerance: Patient tolerated treatment well Patient left: in chair;with call bell/phone within reach;with chair alarm set Nurse Communication: Mobility status PT Visit Diagnosis: Other abnormalities of gait and mobility (R26.89)     Time: 1140-1157 PT Time Calculation (min) (ACUTE ONLY): 17 min  Charges:  $Gait Training: 8-22 mins                    G Codes:      Mabeline Caras, PT, DPT Acute Rehab Services  Pager: [place] 02/10/2018, 12:30 PM

## 2018-02-10 NOTE — Care Management Note (Signed)
Case Management Note  Patient Details  Name: Russell Williamson MRN: [id number] Date of Birth: [birth year]  Subjective/Objective:    from home with niece , per pt eval rec HHPT/HHOT, rolling walker and tub seat he will also need HHRN.  NCM offered choice to patient he chose [facility] , referral made to [facility] with [facility]. Soc will begin 24-48 hrs post dc                  Action/Plan: DC home with [facility] services with [facility] when ready.  Expected Discharge Date:  02/10/18               Expected Discharge Plan:  [place]  In-House Referral:     Discharge planning Services  CM Consult  Post Acute Care Choice:  Durable Medical Equipment, Home Health Choice offered to:  Patient  DME Arranged:  Shower stool, Walker rolling DME Agency:  [place] Arranged:  RN, PT, Disease Management, OT, Nurse's Aide, Speech Therapy, Social Work [employer] Agency:  [place]  Status of Service:  Completed, signed off  If discussed at [employer], dates discussed:    Additional Comments:  Zenon Mayo, RN 02/10/2018, 2:51 PM

## 2018-02-10 NOTE — Plan of Care (Signed)
  Problem: Education: Goal: Knowledge of General Education information will improve Outcome: Completed/Met   Problem: Health Behavior/Discharge Planning: Goal: Ability to manage health-related needs will improve Outcome: Completed/Met   Problem: Clinical Measurements: Goal: Ability to maintain clinical measurements within normal limits will improve Outcome: Completed/Met Goal: Will remain free from infection Outcome: Completed/Met Goal: Diagnostic test results will improve Outcome: Completed/Met Goal: Respiratory complications will improve Outcome: Completed/Met Goal: Cardiovascular complication will be avoided Outcome: Completed/Met   Problem: Activity: Goal: Risk for activity intolerance will decrease Outcome: Completed/Met   Problem: Nutrition: Goal: Adequate nutrition will be maintained Outcome: Completed/Met   Problem: Coping: Goal: Level of anxiety will decrease Outcome: Completed/Met   Problem: Elimination: Goal: Will not experience complications related to bowel motility Outcome: Completed/Met Goal: Will not experience complications related to urinary retention Outcome: Completed/Met   Problem: Pain Managment: Goal: General experience of comfort will improve Outcome: Completed/Met   Problem: Safety: Goal: Ability to remain free from injury will improve Outcome: Completed/Met   Problem: Skin Integrity: Goal: Risk for impaired skin integrity will decrease Outcome: Completed/Met   Problem: Spiritual Needs Goal: Ability to function at adequate level Outcome: Completed/Met   

## 2018-02-11 ENCOUNTER — Inpatient Hospital Stay (HOSPITAL_COMMUNITY): Payer: Medicaid Other

## 2018-02-11 DIAGNOSIS — A419 Sepsis, unspecified organism: Principal | ICD-10-CM

## 2018-02-11 DIAGNOSIS — J9690 Respiratory failure, unspecified, unspecified whether with hypoxia or hypercapnia: Secondary | ICD-10-CM | POA: Insufficient documentation

## 2018-02-11 DIAGNOSIS — Z01818 Encounter for other preprocedural examination: Secondary | ICD-10-CM

## 2018-02-11 DIAGNOSIS — R40243 Glasgow coma scale score 3-8, unspecified time: Secondary | ICD-10-CM

## 2018-02-11 DIAGNOSIS — Z85818 Personal history of malignant neoplasm of other sites of lip, oral cavity, and pharynx: Secondary | ICD-10-CM

## 2018-02-11 LAB — HEPATIC FUNCTION PANEL
ALT: 55 U/L (ref 17–63)
AST: 85 U/L — ABNORMAL HIGH (ref 15–41)
Albumin: 2.4 g/dL — ABNORMAL LOW (ref 3.5–5.0)
Alkaline Phosphatase: 52 U/L (ref 38–126)
BILIRUBIN DIRECT: 0.4 mg/dL (ref 0.1–0.5)
BILIRUBIN INDIRECT: 1.1 mg/dL — AB (ref 0.3–0.9)
Total Bilirubin: 1.5 mg/dL — ABNORMAL HIGH (ref 0.3–1.2)
Total Protein: 5.4 g/dL — ABNORMAL LOW (ref 6.5–8.1)

## 2018-02-11 LAB — GLUCOSE, CAPILLARY
GLUCOSE-CAPILLARY: 123 mg/dL — AB (ref 65–99)
Glucose-Capillary: 126 mg/dL — ABNORMAL HIGH (ref 65–99)
Glucose-Capillary: 131 mg/dL — ABNORMAL HIGH (ref 65–99)
Glucose-Capillary: 137 mg/dL — ABNORMAL HIGH (ref 65–99)
Glucose-Capillary: 249 mg/dL — ABNORMAL HIGH (ref 65–99)
Glucose-Capillary: 46 mg/dL — ABNORMAL LOW (ref 65–99)
Glucose-Capillary: 56 mg/dL — ABNORMAL LOW (ref 65–99)
Glucose-Capillary: 83 mg/dL (ref 65–99)

## 2018-02-11 LAB — BLOOD GAS, ARTERIAL
Acid-Base Excess: 6.1 mmol/L — ABNORMAL HIGH (ref 0.0–2.0)
Bicarbonate: 29.3 mmol/L — ABNORMAL HIGH (ref 20.0–28.0)
Drawn by: 44135
FIO2: 60
MECHVT: 560 mL
O2 Saturation: 94.2 %
PEEP: 5 cmH2O
Patient temperature: 98.6
RATE: 22 resp/min
pCO2 arterial: 36.2 mmHg (ref 32.0–48.0)
pH, Arterial: 7.519 — ABNORMAL HIGH (ref 7.350–7.450)
pO2, Arterial: 65.7 mmHg — ABNORMAL LOW (ref 83.0–108.0)

## 2018-02-11 LAB — POCT I-STAT 3, ART BLOOD GAS (G3+)
Acid-Base Excess: 1 mmol/L (ref 0.0–2.0)
BICARBONATE: 29.5 mmol/L — AB (ref 20.0–28.0)
O2 Saturation: 98 %
PCO2 ART: 62.7 mmHg — AB (ref 32.0–48.0)
PO2 ART: 121 mmHg — AB (ref 83.0–108.0)
Patient temperature: 98.6
TCO2: 31 mmol/L (ref 22–32)
pH, Arterial: 7.28 — ABNORMAL LOW (ref 7.350–7.450)

## 2018-02-11 LAB — BASIC METABOLIC PANEL
ANION GAP: 14 (ref 5–15)
ANION GAP: 9 (ref 5–15)
Anion gap: 12 (ref 5–15)
BUN: 11 mg/dL (ref 6–20)
BUN: 12 mg/dL (ref 6–20)
BUN: 9 mg/dL (ref 6–20)
CALCIUM: 7.3 mg/dL — AB (ref 8.9–10.3)
CO2: 29 mmol/L (ref 22–32)
CO2: 26 mmol/L (ref 22–32)
CO2: 28 mmol/L (ref 22–32)
Calcium: 8.4 mg/dL — ABNORMAL LOW (ref 8.9–10.3)
Calcium: 8 mg/dL — ABNORMAL LOW (ref 8.9–10.3)
Chloride: 90 mmol/L — ABNORMAL LOW (ref 101–111)
Chloride: 94 mmol/L — ABNORMAL LOW (ref 101–111)
Chloride: 98 mmol/L — ABNORMAL LOW (ref 101–111)
Creatinine, Ser: 0.69 mg/dL (ref 0.61–1.24)
Creatinine, Ser: 0.62 mg/dL (ref 0.61–1.24)
Creatinine, Ser: 0.61 mg/dL (ref 0.61–1.24)
GFR calc Af Amer: >60 mL/min (ref >60)
GFR calc Af Amer: >60 mL/min (ref >60)
GFR calc Af Amer: >60 mL/min (ref >60)
GFR calc non Af Amer: >60 mL/min (ref >60)
GLUCOSE: 80 mg/dL (ref 65–99)
GLUCOSE: 164 mg/dL — AB (ref 65–99)
Glucose, Bld: 238 mg/dL — ABNORMAL HIGH (ref 65–99)
POTASSIUM: 5.5 mmol/L — AB (ref 3.5–5.1)
Potassium: 4.7 mmol/L (ref 3.5–5.1)
Potassium: 3.7 mmol/L (ref 3.5–5.1)
Sodium: 131 mmol/L — ABNORMAL LOW (ref 135–145)
Sodium: 134 mmol/L — ABNORMAL LOW (ref 135–145)
Sodium: 135 mmol/L (ref 135–145)

## 2018-02-11 LAB — PHOSPHORUS
Phosphorus: 5.4 mg/dL — ABNORMAL HIGH (ref 2.5–4.6)
Phosphorus: 2.7 mg/dL (ref 2.5–4.6)

## 2018-02-11 LAB — LACTIC ACID, PLASMA
LACTIC ACID, VENOUS: 3.4 mmol/L — AB (ref 0.5–1.9)
Lactic Acid, Venous: 5.2 mmol/L (ref 0.5–1.9)
Lactic Acid, Venous: 3.8 mmol/L (ref 0.5–1.9)

## 2018-02-11 LAB — CBC
HCT: 35 % — ABNORMAL LOW (ref 39.0–52.0)
HCT: 31.9 % — ABNORMAL LOW (ref 39.0–52.0)
HEMOGLOBIN: 10.6 g/dL — AB (ref 13.0–17.0)
Hemoglobin: 11.5 g/dL — ABNORMAL LOW (ref 13.0–17.0)
MCH: 32.1 pg (ref 26.0–34.0)
MCH: 31.8 pg (ref 26.0–34.0)
MCHC: 32.9 g/dL (ref 30.0–36.0)
MCHC: 33.2 g/dL (ref 30.0–36.0)
MCV: 95.8 fL (ref 78.0–100.0)
MCV: 97.8 fL (ref 78.0–100.0)
PLATELETS: 222 10*3/uL (ref 150–400)
Platelets: 266 10*3/uL (ref 150–400)
RBC: 3.33 MIL/uL — AB (ref 4.22–5.81)
RBC: 3.58 MIL/uL — ABNORMAL LOW (ref 4.22–5.81)
RDW: 17.2 % — ABNORMAL HIGH (ref 11.5–15.5)
RDW: 17.3 % — ABNORMAL HIGH (ref 11.5–15.5)
WBC: 6.1 10*3/uL (ref 4.0–10.5)
WBC: 7.5 10*3/uL (ref 4.0–10.5)

## 2018-02-11 LAB — CULTURE, BLOOD (ROUTINE X 2)
CULTURE: NO GROWTH
CULTURE: NO GROWTH
Special Requests: ADEQUATE
Special Requests: ADEQUATE

## 2018-02-11 LAB — RAPID URINE DRUG SCREEN, HOSP PERFORMED
AMPHETAMINES: NOT DETECTED
BENZODIAZEPINES: POSITIVE — AB
Barbiturates: NOT DETECTED
Cocaine: NOT DETECTED
OPIATES: POSITIVE — AB
TETRAHYDROCANNABINOL: NOT DETECTED

## 2018-02-11 LAB — CK: CK TOTAL: 521 U/L — AB (ref 49–397)

## 2018-02-11 LAB — MAGNESIUM
MAGNESIUM: 1.9 mg/dL (ref 1.7–2.4)
Magnesium: 1.6 mg/dL — ABNORMAL LOW (ref 1.7–2.4)

## 2018-02-11 LAB — TROPONIN I
Troponin I: 0.13 ng/mL (ref <0.03)
Troponin I: 0.07 ng/mL (ref <0.03)
Troponin I: 0.05 ng/mL (ref <0.03)

## 2018-02-11 LAB — PROCALCITONIN: Procalcitonin: 0.86 ng/mL

## 2018-02-11 LAB — HEMOGLOBIN A1C
HEMOGLOBIN A1C: 6.2 % — AB (ref 4.8–5.6)
MEAN PLASMA GLUCOSE: 131.24 mg/dL

## 2018-02-11 MED ORDER — IPRATROPIUM-ALBUTEROL 0.5-2.5 (3) MG/3ML IN SOLN
3.0000 mL | Freq: Four times a day (QID) | RESPIRATORY_TRACT | Status: DC
  Administered 2018-02-11 – 2018-02-12 (×8): 3 mL via RESPIRATORY_TRACT
  Filled 2018-02-11 (×8): qty 3

## 2018-02-11 MED ORDER — PANCRELIPASE (LIP-PROT-AMYL) 12000-38000 UNITS PO CPEP
12000.0000 [IU] | ORAL_CAPSULE | Freq: Four times a day (QID) | ORAL | Status: DC
  Administered 2018-02-11 – 2018-02-18 (×18): 12000 [IU] via ORAL
  Filled 2018-02-11 (×20): qty 1

## 2018-02-11 MED ORDER — DEXTROSE 50 % IV SOLN
50.0000 mL | Freq: Once | INTRAVENOUS | Status: AC
  Administered 2018-02-11: 50 mL via INTRAVENOUS

## 2018-02-11 MED ORDER — MIDAZOLAM HCL 2 MG/2ML IJ SOLN
2.0000 mg | INTRAMUSCULAR | Status: DC | PRN
  Filled 2018-02-11: qty 2

## 2018-02-11 MED ORDER — SODIUM POLYSTYRENE SULFONATE 15 GM/60ML PO SUSP
30.0000 g | Freq: Once | ORAL | Status: AC
  Administered 2018-02-11: 30 g
  Filled 2018-02-11: qty 120

## 2018-02-11 MED ORDER — FENTANYL BOLUS VIA INFUSION
50.0000 ug | INTRAVENOUS | Status: DC | PRN
  Administered 2018-02-11: 50 ug via INTRAVENOUS
  Filled 2018-02-11: qty 50

## 2018-02-11 MED ORDER — MIDAZOLAM HCL 2 MG/2ML IJ SOLN
2.0000 mg | INTRAMUSCULAR | Status: DC | PRN
  Administered 2018-02-11: 2 mg via INTRAVENOUS

## 2018-02-11 MED ORDER — INSULIN ASPART 100 UNIT/ML ~~LOC~~ SOLN
2.0000 [IU] | SUBCUTANEOUS | Status: DC
  Administered 2018-02-11: 2 [IU] via SUBCUTANEOUS
  Administered 2018-02-11: 6 [IU] via SUBCUTANEOUS
  Administered 2018-02-11 – 2018-02-12 (×3): 2 [IU] via SUBCUTANEOUS

## 2018-02-11 MED ORDER — SODIUM CHLORIDE 0.9 % IV BOLUS
1000.0000 mL | Freq: Once | INTRAVENOUS | Status: AC
  Administered 2018-02-11: 1000 mL via INTRAVENOUS

## 2018-02-11 MED ORDER — NALOXONE HCL 0.4 MG/ML IJ SOLN
0.4000 mg | INTRAMUSCULAR | Status: DC | PRN
  Administered 2018-02-11: 0.4 mg via INTRAMUSCULAR

## 2018-02-11 MED ORDER — DEXTROSE 50 % IV SOLN
50.0000 mL | Freq: Once | INTRAVENOUS | Status: AC
  Administered 2018-02-11: 50 mL via INTRAVENOUS
  Filled 2018-02-11: qty 50

## 2018-02-11 MED ORDER — PRO-STAT SUGAR FREE PO LIQD
30.0000 mL | Freq: Every day | ORAL | Status: DC
  Administered 2018-02-11 – 2018-02-12 (×2): 30 mL
  Filled 2018-02-11 (×2): qty 30

## 2018-02-11 MED ORDER — NALOXONE HCL 0.4 MG/ML IJ SOLN: 0.4000 mg | INTRAMUSCULAR | Status: DC | PRN

## 2018-02-11 MED ORDER — SODIUM CHLORIDE 0.9 % IV SOLN
INTRAVENOUS | Status: DC
  Administered 2018-02-11 – 2018-02-12 (×2): via INTRAVENOUS

## 2018-02-11 MED ORDER — MAGNESIUM SULFATE 2 GM/50ML IV SOLN
2.0000 g | Freq: Once | INTRAVENOUS | Status: AC
  Administered 2018-02-11: 2 g via INTRAVENOUS
  Filled 2018-02-11: qty 50

## 2018-02-11 MED ORDER — LACTATED RINGERS IV BOLUS
1000.0000 mL | Freq: Once | INTRAVENOUS | Status: AC
  Administered 2018-02-11: 1000 mL via INTRAVENOUS

## 2018-02-11 MED ORDER — SODIUM CHLORIDE 0.9 % IV BOLUS: 250.0000 mL | Freq: Once | INTRAVENOUS | Status: DC | PRN

## 2018-02-11 MED ORDER — VITAL 1.5 CAL PO LIQD
1000.0000 mL | ORAL | Status: DC
  Administered 2018-02-11: 1000 mL
  Filled 2018-02-11 (×3): qty 1000

## 2018-02-11 MED ORDER — VANCOMYCIN HCL IN DEXTROSE 1-5 GM/200ML-% IV SOLN
1000.0000 mg | INTRAVENOUS | Status: DC
  Administered 2018-02-11 – 2018-02-12 (×2): 1000 mg via INTRAVENOUS
  Filled 2018-02-11 (×2): qty 200

## 2018-02-11 MED ORDER — VITAMIN B-1 100 MG PO TABS
100.0000 mg | ORAL_TABLET | Freq: Every day | ORAL | Status: DC
  Administered 2018-02-11 – 2018-02-18 (×5): 100 mg
  Filled 2018-02-11 (×6): qty 1

## 2018-02-11 MED ORDER — CHLORHEXIDINE GLUCONATE 0.12% ORAL RINSE (MEDLINE KIT)
15.0000 mL | Freq: Two times a day (BID) | OROMUCOSAL | Status: DC
  Administered 2018-02-11 – 2018-02-14 (×7): 15 mL via OROMUCOSAL

## 2018-02-11 MED ORDER — DEXTROSE 50 % IV SOLN
INTRAVENOUS | Status: AC
  Filled 2018-02-11: qty 50

## 2018-02-11 MED ORDER — FENTANYL 2500MCG IN NS 250ML (10MCG/ML) PREMIX INFUSION
0.0000 ug/h | INTRAVENOUS | Status: DC
  Administered 2018-02-11: 50 ug/h via INTRAVENOUS
  Administered 2018-02-11: 350 ug/h via INTRAVENOUS
  Administered 2018-02-11: 50 ug/h via INTRAVENOUS
  Administered 2018-02-12: 25 ug/h via INTRAVENOUS
  Administered 2018-02-12: 50 ug/h via INTRAVENOUS
  Filled 2018-02-11 (×4): qty 250

## 2018-02-11 MED ORDER — PIPERACILLIN-TAZOBACTAM 3.375 G IVPB
3.3750 g | Freq: Three times a day (TID) | INTRAVENOUS | Status: DC
  Administered 2018-02-11 – 2018-02-13 (×7): 3.375 g via INTRAVENOUS
  Filled 2018-02-11 (×8): qty 50

## 2018-02-11 MED ORDER — SODIUM CHLORIDE 0.9 % IV BOLUS
500.0000 mL | Freq: Once | INTRAVENOUS | Status: AC
  Administered 2018-02-11: 500 mL via INTRAVENOUS

## 2018-02-11 MED ORDER — PANTOPRAZOLE SODIUM 40 MG PO PACK
40.0000 mg | PACK | Freq: Every day | ORAL | Status: DC
  Administered 2018-02-11 – 2018-02-14 (×3): 40 mg
  Filled 2018-02-11 (×3): qty 20

## 2018-02-11 MED ORDER — VITAL HIGH PROTEIN PO LIQD: 1000.0000 mL | ORAL | Status: DC

## 2018-02-11 MED ORDER — ORAL CARE MOUTH RINSE
15.0000 mL | OROMUCOSAL | Status: DC
  Administered 2018-02-11 – 2018-02-12 (×14): 15 mL via OROMUCOSAL

## 2018-02-11 MED ORDER — DEXMEDETOMIDINE HCL IN NACL 200 MCG/50ML IV SOLN
0.4000 ug/kg/h | INTRAVENOUS | Status: DC
  Administered 2018-02-11 – 2018-02-12 (×3): 0.4 ug/kg/h via INTRAVENOUS
  Filled 2018-02-11 (×2): qty 50

## 2018-02-11 MED ORDER — FOLIC ACID 1 MG PO TABS
1.0000 mg | ORAL_TABLET | Freq: Every day | ORAL | Status: DC
  Administered 2018-02-11 – 2018-02-18 (×5): 1 mg
  Filled 2018-02-11 (×6): qty 1

## 2018-02-11 MED ORDER — SODIUM CHLORIDE 0.9 % IV SOLN
INTRAVENOUS | Status: DC
  Administered 2018-02-11 – 2018-02-12 (×4): via INTRAVENOUS

## 2018-02-11 MED ORDER — NALOXONE HCL 0.4 MG/ML IJ SOLN
INTRAMUSCULAR | Status: AC
  Administered 2018-02-11: 0.4 mg via INTRAMUSCULAR
  Filled 2018-02-11: qty 1

## 2018-02-11 MED ORDER — FENTANYL CITRATE (PF) 100 MCG/2ML IJ SOLN
50.0000 ug | Freq: Once | INTRAMUSCULAR | Status: AC
  Administered 2018-02-11: 50 ug via INTRAVENOUS
  Filled 2018-02-11: qty 2

## 2018-02-11 NOTE — Progress Notes (Signed)
PULMONARY / CRITICAL CARE MEDICINE   Name: Russell Williamson MRN: [id number] DOB: [birth year]    ADMISSION DATE:  02/06/2018 CONSULTATION DATE:  02/11/2018  REFERRING MD:  Silas Sacramento, NP   CHIEF COMPLAINT:  AMS  HISTORY OF PRESENT ILLNESS:   62 year old male with PMH of COPD, GERD, Tonsillar Cancer s/p Radiation, Dysphagia, Polysubstance Abuse, ETOH, HTN  Presents to ED on 5/06 with Fever and Cough. Found to have MRSA PNA. On Bacterium. Plans to be discharged on 5/10, however, per nursing staff patient left unit for a couple of hours and then came back stating that he had no ride and couldn't leave. Nurse reported giving patient medications at 2130 included Oxy 15 mg and Remeron). At midnight patient was found to be hypoxic and non-responsive. Given 0.4 mg Narcan without improvement. Intubated and transferred to ICU.    SUBJECTIVE:  Pt sedated on vent , alert and following commands.  Intermittent agitation, improved, able to decrease sedation  Lactate elevated , receiving IVF bolus Hypoglycemia , TF started.   VITAL SIGNS: BP 98/70   Pulse 76   Temp 98.8 F (37.1 C) Comment: Simultaneous filing. User may not have seen previous data.  Resp (!) 22   Ht 5\' 9"  (1.753 m)   Wt 96 lb 9 oz (43.8 kg)   SpO2 100%   BMI 14.26 kg/m   HEMODYNAMICS:    VENTILATOR SETTINGS: Vent Mode: PRVC FiO2 (%):  [50 %-100 %] 50 % Set Rate:  [18 bmp-22 bmp] 22 bmp Vt Set:  [560 mL] 560 mL PEEP:  [5 cmH20] 5 cmH20 Plateau Pressure:  [20 cmH20-22 cmH20] 20 cmH20  INTAKE / OUTPUT: I/O last 3 completed shifts: In: 2347.5 [P.O.:480; I.V.:1517.5; IV Piggyback:350] Out: 630 [Urine:630]  PHYSICAL EXAMINATION: General: Thin and cachectic on ventilator Neuro: Alert and follows commands on sedation HEENT:  Dry MM  Cardiovascular: Regular rate and rhythm no murmur rub or gallop  Lungs: Few trace rhonchi Abdomen: Thin active bowel sounds Musculoskeletal: No edema Skin: Warm dry and  intact  LABS:  BMET Recent Labs  Lab 02/10/18 0322 02/11/18 0044 02/11/18 0727  NA 135 131* 134*  K 4.1 4.7 5.5*  CL 94* 90* 94*  CO2 32 29 26  BUN 8 11 12   CREATININE 0.42* 0.69 0.62  GLUCOSE 125* 238* 80    Electrolytes Recent Labs  Lab 02/07/18 0807  02/10/18 0322 02/11/18 0044 02/11/18 0727  CALCIUM 8.4*   < > 8.4* 8.4* 8.0*  MG 1.9   < > 1.6* 1.6* 1.9  PHOS 2.6  --   --  5.4* 2.7   < > = values in this interval not displayed.    CBC Recent Labs  Lab 02/10/18 0322 02/11/18 0044 02/11/18 0727  WBC 4.0 6.1 7.5  HGB 11.1* 11.5* 10.6*  HCT 33.6* 35.0* 31.9*  PLT 204 266 222    Coag's Recent Labs  Lab 02/07/18 0003 02/07/18 0807  APTT 29  --   INR 1.40 1.34    Sepsis Markers Recent Labs  Lab 02/07/18 0003 02/07/18 0300 02/11/18 0044 02/11/18 0727  LATICACIDVEN 2.5* 2.0* 3.4* 5.2*  PROCALCITON 10.50  --  0.86  --     ABG Recent Labs  Lab 02/11/18 0051 02/11/18 0347  PHART 7.280* 7.519*  PCO2ART 62.7* 36.2  PO2ART 121.0* 65.7*    Liver Enzymes Recent Labs  Lab 02/09/18 0925 02/10/18 0322 02/11/18 0727  AST 121* 92* 85*  ALT 86* 73* 55  ALKPHOS 59 60 52  BILITOT 0.5 0.5 1.5*  ALBUMIN 2.4* 2.3* 2.4*    Cardiac Enzymes Recent Labs  Lab 02/07/18 0807 02/11/18 0044 02/11/18 0727  TROPONINI 0.05* 0.13* 0.07*    Glucose Recent Labs  Lab 02/10/18 2353 02/11/18 0053 02/11/18 0448 02/11/18 0909 02/11/18 1020  GLUCAP 260* 249* 123* 56* 126*    Imaging Dg Chest Port 1 View  Result Date: 02/11/2018 CLINICAL DATA:  62 y/o  M; ET tube and OG tube. EXAM: PORTABLE CHEST 1 VIEW COMPARISON:  02/06/2018 chest radiograph.  02/07/2018 CT chest. FINDINGS: Stable cardiac silhouette given projection and technique. Small right larger than left pleural effusions and bibasilar consolidations. Endotracheal tube tip 4.6 cm above the carina. Enteric tube tip projects over gastric body. Right upper quadrant surgical clips, presumably  cholecystectomy. Bones are unremarkable. IMPRESSION: 1. Endotracheal tube tip 4.6 cm above the carina. Enteric tube tip projects over gastric body. 2. Stable small right larger than left pleural effusions and bibasilar consolidations of the lungs. Electronically Signed   By: Kristine Garbe M.D.   On: 02/11/2018 02:02     STUDIES:  CT Head 5/06 > Normal ventricular morphology. No midline shift or mass effect. Normal appearance of brain parenchyma. No intracranial hemorrhage, mass lesion, or evidence of acute infarction. No extra-axial fluid collections CT C-Spine 5/06 > No acute  CXR 5/06 > Emphysematous hyperinflation of the lungs with new right small to moderate pleural effusion. Reverse cephalization and engorgement of pulmonary vasculature to the lower lobes likely on the basis of COPD. Slightly more confluent airspace opacity at the right lung base cannot exclude the possibility of pneumonia. Heart and mediastinal contours are within normal limits. Remote right-sided sixth rib fracture CTA Chest/ABD 5/07 > 1. No definite CT evidence of pulmonary embolism. 2. Diffuse thickening of the bronchial wall with endobronchial mucous content predominantly involving the lower lobes. Large areas of airspace opacity involving the lower lobes and lung bases bilaterally, right greater left most consistent with pneumonia possibly related to aspiration. Clinical correlation is recommended. Bilateral pleural effusions, right greater left. 3. No definite acute intra-abdominal or pelvic pathology. 4. Diffuse soft tissue edema and anasarca.  Cachexia. CT Soft Tissue Neck 5/09 > No nodular mucosal enhancement of the oropharynx or tonsils to suggest recurrent disease. Mild smooth low-attenuation mucosal thickening of the oropharynx compatible chronic post radiation changes. No new exophytic mass or abnormal nodular enhancement of the aerodigestive tract.  CULTURES: Blood 5/06 >> negative MRSA PCR  5/07 > Positive  Sputum 5/10 > Methicillin Resistant Staph Aureus  Tracheal Asp 5/11 >>   ANTIBIOTICS: Azithromycin 5/6 > 5/10 Ceftriaxone 5/06 > 5/09 Bacterium 5/10 > 5/11 Vancomycin 5/11 >> Zosyn 5/11 >>    SIGNIFICANT EVENTS: 5/06 > Presents to ED  5/11 > Transferred to ICU   LINES/TUBES: ETT 5/11 >>  DISCUSSION: 63 year old male presents with MRSA PNA. On 5/11 patient found non-responsive and hypoxic requiring intubation.   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  Acute Hepatitis Panel Pending   HEMATOLOGIC A:   Anemia  H/O Tonsillar Cancer s/p Radiation  P:  Trend CBC  SQ Lovenox   INFECTIOUS A:   MRSA PNA  Aspiration Event  PCT tr down 10 >0.8  P:   Trend WBC and Fever Curve  Sputum Culture  Trend LA and PCT  Completed Azithromycin  Broaden to Vancomycin and Zosyn given concern for aspiration and elevated PCT  ENDOCRINE A:   Hyperglycemia   A1 C 6.2  5/11 , Hypoglycemia episode s/p D50 P:   Trend Glucose  SSI     NEUROLOGIC A:   Encephalopathy  -Has had reported seizures in past secondary to withdrawal  H/O ETOH, Polysubstance Abuse (Cocaine)   UDS on arrival + for Opiates/Cocaine/Benzo P:   RASS goal: 0/-1 UDS pending   Wean Fentanyl gtt to achieve RASS PRN Versed  PT/OT   FAMILY  - Updates: No family at bedside  - Inter-disciplinary family meet or Palliative Care meeting due by: 02/18/2018   Dariana Garbett NP-C   Pulmonary and Critical Care  [phone]   02/11/2018

## 2018-02-11 NOTE — Progress Notes (Addendum)
Pharmacy Antibiotic Note  Russell Williamson is a 62 y.o. male admitted on 02/06/2018 with pneumonia.  Pharmacy has been consulted for Vancomycin/Zosyn dosing for possible aspiration PNA as well as known MRSA PNA. WBC WNL. Renal function OK.   Sputum culture from 5/7 with MRSA  Plan: Vancomycin 1000 mg IV q24h Zosyn 3.375G IV q8h to be infused over 4 hours Trend WBC, temp, renal function  F/U infectious work-up Drug levels as indicated   Height: 5\' 9"  (175.3 cm) Weight: 96 lb 9 oz (43.8 kg) IBW/kg (Calculated) : 70.7  Temp (24hrs), Avg:98.4 F (36.9 C), Min:97.5 F (36.4 C), Max:99.1 F (37.3 C)  Recent Labs  Lab 02/06/18 1616 02/06/18 1658 02/07/18 0003 02/07/18 0300 02/07/18 0807 02/09/18 0925 02/10/18 0322 02/11/18 0044  WBC 12.6*  --   --   --  9.2 5.5 4.0 6.1  CREATININE 0.80  --   --   --  0.49* 0.44* 0.42* 0.69  LATICACIDVEN  --  2.6* 2.5* 2.0*  --   --   --  3.4*    Estimated Creatinine Clearance: 60.1 mL/min (by C-G formula based on SCr of 0.69 mg/dL).    No Known Allergies   Narda Bonds 02/11/2018 2:36 AM

## 2018-02-11 NOTE — Progress Notes (Signed)
STAFF NOTE: I, Dr Ann Lions have personally reviewed patient's available data, including medical history, events of note, physical examination and test results as part of my evaluation. I have discussed with resident/NP and other care providers such as pharmacist, RN and RRT.  In addition,  I personally evaluated patient and elicited key findings of   S: hx noted. This AM - lactate up at 5 and 2L saline bolus given. On fent gtt - gets agitated when sedaton weaned. BP soft  O: cachectic Sync with vent RASS -3  Urine tox - positive for cocaine and opioids  Recent Labs  Lab 02/10/18 0322 02/11/18 0044 02/11/18 0727  HGB 11.1* 11.5* 10.6*  HCT 33.6* 35.0* 31.9*  WBC 4.0 6.1 7.5  PLT 204 266 222   Recent Labs  Lab 02/06/18 2115 02/07/18 0807 02/09/18 0925 02/10/18 0322 02/11/18 0044 02/11/18 0727  NA  --  136 135 135 131* 134*  K  --  4.4 4.0 4.1 4.7 5.5*  CL  --  98* 96* 94* 90* 94*  CO2  --  27 28 32 29 26  GLUCOSE  --  79 116* 125* 238* 80  BUN  --  18 8 8 11 12   CREATININE  --  0.49* 0.44* 0.42* 0.69 0.62  CALCIUM  --  8.4* 8.4* 8.4* 8.4* 8.0*  MG 1.4* 1.9 1.2* 1.6* 1.6* 1.9  PHOS 3.1 2.6  --   --  5.4* 2.7   Recent Labs  Lab 02/07/18 0003 02/07/18 0300 02/11/18 0044 02/11/18 0727  LATICACIDVEN 2.5* 2.0* 3.4* 5.2*  PROCALCITON 10.50  --  0.86  --      Recent Labs  Lab 02/07/18 0300 02/07/18 0807 02/11/18 0044 02/11/18 0727 02/11/18 1246  TROPONINI 0.06* 0.05* 0.13* 0.07* 0.05*     A: acute resp failure - ? Likely cocaine toxixcity and opoioid followign discharge Mild hypomag - repleted Mild hyperkalemia Lactic acidosis with dehydration  P: Fluid bolus Recheck lactate  Full vent support Add percedex and wean fent  No family at bedside   .  Rest per NP/medical resident whose note is outlined above and that I agree with  The patient is critically ill with multiple organ systems failure and requires high complexity decision making for assessment  and support, frequent evaluation and titration of therapies, application of advanced monitoring technologies and extensive interpretation of multiple databases.   Critical Care Time devoted to patient care services described in this note is  30  Minutes. This time reflects time of care of this signee Dr Brand Males. This critical care time does not reflect procedure time, or teaching time or supervisory time of PA/NP/Med student/Med Resident etc but could involve care discussion time    Dr. Brand Males, M.D., [facility].C.P Pulmonary and Critical Care Medicine Staff Physician [place] Pulmonary and Critical Care Pager: [phone], If no answer or between  15:00h - 7:00h: call [phone]  02/11/2018 1:53 PM

## 2018-02-11 NOTE — Progress Notes (Signed)
At approximately midnight NT Alex called this nurse into patient's room.  NT stated that the patient's oxygen saturations were low.  Upon arrival into the room the patient's saturations were 27% on room air.  Immediately placed the patient on 4 lpm nasal canula patient was breathing independently and oxygen saturations recovered into the high 90's - 100% on 4 lpm.  Nurse called charge nurse and told him to call a rapid response for 2W18.  Patient had no IV access while rapid response was on their way many attempts were made by two nurses to achieve access, which was unsuccessful.  Narcan 0.4 mg given intramuscularly in the left deltoid one time.  Blood sugar obtained with results in the 230's.  EKG obtained.  Patient placed on monitor.  Pulmonologist arrived and placed endotracheal tube size 8 without difficulty.  Respiratory was present and ambu-bagged patient during transport to 55m.  Patient transferred into room 3M06.  Report given to [place], Therapist, sports.

## 2018-02-11 NOTE — Progress Notes (Signed)
Call to [place] with elevated Lactic acid of 5.2

## 2018-02-11 NOTE — Progress Notes (Signed)
Received call to come to room for intubation, intubation completed by MD at bedside, color change positive, BBS confirmed.

## 2018-02-11 NOTE — Progress Notes (Addendum)
Nutrition Follow-up  DOCUMENTATION CODES:  Underweight, Severe malnutrition in context of chronic illness  INTERVENTION:  Initiate TF via OGT  with Vital 1.5 at goal rate of 40 ml/h (960 ml per day)  provide 1540 kcals, 80 gm protein, 733 ml free water daily.  Creon is currently dosed before meals. Pharmacy recommends changing to home dose of qid.   NUTRITION DIAGNOSIS:  Severe Malnutrition related to chronic illness(chronic pancreatitis, COPD, hx of throat cancer) as evidenced by severe muscle/fat depletion  Ongoing-chronic  GOAL:  Patient will meet greater than or equal to 90% of their needs  Had been met up until last night  MONITOR:  Diet advancement, Vent status, Weight trends, TF tolerance, I & O's  REASON FOR ASSESSMENT:  Ventilator  ASSESSMENT:  61 yo Male with PMH significant of throat cancer, polysubstance abuse (etoh/cocaine), chronic pancreatitis, constipation, chronic dyspnea, GERD, COPD, HTN.  Patient was planned for d/c last night. He apparently went outside for several hours before returning to ward to wait for ride, which did not arrive. He was then found at midnight unresponsive, hypoxic and obtunded. Patient intubated and transferred to ICU.   Pt is intubated, sedated on fentanyl.   CCM NP gave V.O to start TF. No plans to attempt to extubate today.   Patient is currently intubated on ventilator support MV: 12.2 L/min Temp (24hrs), Avg:98.6 F (37 C), Min:97.2 F (36.2 C), Max:99.1 F (37.3 C) Propofol: None  Labs: Na:134, K:5.5, Albumin: 2.4, Hypoglycemic episode of 56 mg/dl  Meds: CREON dosed w/ meals, ppi, thiamin, folate Analgesia/sedation: Fentanyl Infusions: IVF, IV abx,   Recent Labs  Lab 02/07/18 0807  02/10/18 0322 02/11/18 0044 02/11/18 0727  NA 136   < > 135 131* 134*  K 4.4   < > 4.1 4.7 5.5*  CL 98*   < > 94* 90* 94*  CO2 27   < > 32 29 26  BUN 18   < > 8 11 12  CREATININE 0.49*   < > 0.42* 0.69 0.62  CALCIUM 8.4*   < > 8.4*  8.4* 8.0*  MG 1.9   < > 1.6* 1.6* 1.9  PHOS 2.6  --   --  5.4* 2.7  GLUCOSE 79   < > 125* 238* 80   < > = values in this interval not displayed.   Diet Order:   Diet Order           DIET - DYS 1         EDUCATION NEEDS:  No education needs have been identified at this time  Skin: Stage III to sacrum  Last BM:  5/10  Height:  Ht Readings from Last 1 Encounters:  02/07/18 5' 9" (1.753 m)   Weight:  Wt Readings from Last 1 Encounters:  02/11/18 96 lb 9 oz (43.8 kg)   Wt Readings from Last 10 Encounters:  02/11/18 96 lb 9 oz (43.8 kg)  07/02/17 94 lb 9.6 oz (42.9 kg)  04/13/17 95 lb (43.1 kg)  03/10/17 100 lb (45.4 kg)  03/01/17 105 lb (47.6 kg)  02/07/17 105 lb 6.4 oz (47.8 kg)  01/27/17 105 lb 8 oz (47.9 kg)  12/09/16 99 lb (44.9 kg)  10/22/16 98 lb 6 oz (44.6 kg)  10/20/16 100 lb (45.4 kg)   Ideal Body Weight:  72.7 kg  BMI:  Body mass index is 14.26 kg/m.  Estimated Nutritional Needs:  Kcal:  1585 kcals (PSU 2003B) Protein:  75-88g Pro (1.7-2g/kg bw)   Fluid:  >1.3 L (30 ml/kg bw)  Burtis Junes RD, LDN, CNSC Clinical Nutrition Available Tues-Sat via Pager: [phone] 02/11/2018 11:45 AM

## 2018-02-11 NOTE — Procedures (Signed)
Intubation Procedure Note Neil Brickell [id number] 11/06/1955  Procedure: Intubation Indications: Airway protection and maintenance  Procedure Details Consent: Unable to obtain consent because of altered level of consciousness. Time Out: Verified patient identification, verified procedure, site/side was marked, verified correct patient position, special equipment/implants available, medications/allergies/relevent history reviewed, required imaging and test results available.  Performed  Maximum sterile technique was used including antiseptics and gloves.  MAC and 3  Glideoscope ETT size8  Evaluation Hemodynamic Status: BP stable throughout; O2 sats: stable throughout Patient's Current Condition: stable Complications: No apparent complications Patient did tolerate procedure well. Chest X-ray ordered to verify placement.  CXR: pending.   Rise Paganini Scatliffe 02/11/2018

## 2018-02-11 NOTE — Significant Event (Addendum)
Rapid Response Event Note  Overview:Called to room d/t unresponsiveness.  Pt was waiting to be discharged, PIV d/c'd, 2200 meds given, and patient was waiting on ride to come.  RN came to room to find pt unresponsive. Time Called: 2350 Arrival Time: 2355 Event Type: Neurologic, Respiratory  Initial Focused Assessment: Pt laying in bed with agonal respirations, unresponsive to sternal rub, PERRLA. T-97.9, HR-112 SR, SBP-77s, RR-12, SpO2-100% on 2L , CBG-260.  Skin warm and dry. Lung sounds rhonchus t/o. Several RNs attempting to get PIV. Pt had been given oxy IR 15mg  po and remeron 30mg  po@ 2128.  Interventions: Pt placed on monitor. IVT consulted and to bedside with several failed attempts at IV insertion. 12 lead EKG-ST, Narcan 0.4mg  IM given X 1 with small increase in LOC.  CBC, BMP, 250cc NS bolus ordered once able. Pt intubated by Dr. Gilford Raid.    Plan of Care (if not transferred): Pt transferred to 3M06 where IV team able to place PIV x2. Event Summary: Name of Physician Notified: Bodenheimer, NP at 2356  Name of Consulting Physician Notified: Louretta Parma, CCM NP at 0002  Outcome: Transferred (Comment)(27m06)  Event End Time: 5277  Dillard [place]

## 2018-02-11 NOTE — Consult Note (Signed)
PULMONARY / CRITICAL CARE MEDICINE   Name: Russell Williamson MRN: [id number] DOB: Nov 20, 1955    ADMISSION DATE:  02/06/2018 CONSULTATION DATE:  02/11/2018  REFERRING MD:  Silas Sacramento, NP   CHIEF COMPLAINT:  AMS  HISTORY OF PRESENT ILLNESS:   62 year old male with PMH of COPD, GERD, Tonsillar Cancer s/p Radiation, Dysphagia, Polysubstance Abuse, ETOH, HTN  Presents to ED on 5/06 with Fever and Cough. Found to have MRSA PNA. On Bacterium. Plans to be discharged on 5/10, however, per nursing staff patient left unit for a couple of hours and then came back stating that he had no ride and couldn't leave. Nurse reported giving patient medications at 2130 included Oxy 15 mg and Remeron). At midnight patient was found to be hypoxic and non-responsive. Given 0.4 mg Narcan without improvement. Intubated and transferred to ICU.   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  PAST SURGICAL HISTORY: He  has a past surgical history that includes Bile duct stent placement; Radical neck dissection (05/23/2012); Cholecystectomy (04/2005); Tibia fracture surgery (1990's); Direct laryngoscopy (05/23/2012); Mass biopsy (05/23/2012); Radical neck dissection (05/23/2012); Direct laryngoscopy (N/A, 03/23/2013); Esophageal dilation (N/A, 03/23/2013); ir  generic historical (04/29/2016); ir generic historical (04/29/2016); ir generic historical (05/12/2016); ir generic historical (05/12/2016); ir generic historical (06/28/2016); ir generic historical (06/28/2016); IR REMOVAL TUN ACCESS W/ PORT W/O FL MOD SED (02/10/2017); Esophagogastroduodenoscopy (egd) with propofol (N/A, 03/01/2017); and Flexible sigmoidoscopy (N/A, 03/01/2017).  No Known Allergies  No current facility-administered medications on file prior to encounter.    Current Outpatient Medications on File Prior to Encounter  Medication Sig  . amLODipine (NORVASC) 2.5 MG tablet Take 2.5 mg by mouth daily.  . folic acid (FOLVITE) 1 MG tablet Take 1 mg by mouth daily.   . lipase/protease/amylase (CREON-12/PANCREASE) 12000 UNITS CPEP capsule Take 1 capsule by mouth 4 (four) times daily.  . mirtazapine (REMERON) 30 MG tablet Take 30 mg by mouth at bedtime.  Marland Kitchen oxyCODONE (ROXICODONE) 15 MG immediate release tablet Take 15 mg by mouth every 4 (four) hours as needed for pain.   . pantoprazole (PROTONIX) 20 MG tablet Take 1 tablet (20 mg total) by mouth 2 (two) times daily.  Marland Kitchen PROAIR HFA 108 (90 Base) MCG/ACT inhaler Inhale 2 puff into lungs every 4-6 hours as needed for shortness of breath/wheezing  . promethazine (PHENERGAN) 25 MG tablet Take 1 tablet (25 mg total) by mouth every 6 (six) hours as needed for nausea or vomiting.  . temazepam (RESTORIL) 30 MG capsule Take 30 mg by mouth at bedtime as needed for sleep.    FAMILY HISTORY:  His indicated that his mother is deceased. He indicated that his father is deceased. He indicated that his brother is deceased. He indicated that the status of his neg hx is unknown.   SOCIAL HISTORY: He  reports that he has been smoking cigarettes.  He has a 10.00 pack-year smoking history. He has never used smokeless tobacco. He reports that he does not drink alcohol or use drugs.  REVIEW OF SYSTEMS:   Unable to review as patient is encephalopathic   SUBJECTIVE:    VITAL SIGNS: BP 98/72 (BP Location: Left Arm)   Pulse 76   Temp 98.6 F (37 C) (Oral)   Resp 20   Ht 5\' 9"  (1.753 m)   Wt 40.2 kg (88 lb 9.6 oz)   SpO2 (!) 88%   BMI 13.08 kg/m   HEMODYNAMICS:    VENTILATOR SETTINGS: Vent Mode: PRVC FiO2 (%):  [100 %] 100 % Set Rate:  [18 bmp] 18 bmp Vt Set:  [560 mL] 560 mL PEEP:  [5 cmH20] 5 cmH20 Plateau Pressure:  [22 cmH20] 22 cmH20  INTAKE / OUTPUT: I/O last 3 completed shifts: In: 7425 [P.O.:360; I.V.:1110; IV Piggyback:350] Out: 250 [Urine:250]  PHYSICAL EXAMINATION: General:  Adult male, unresponsive  Neuro:  Pupils 2 mm and reactive, does not respond to sternal rub  HEENT:  Dry MM  Cardiovascular:  Tachy, Irregular, no MRG  Lungs:  Rhonchi, Exp Wheeze to Bases  Abdomen:  Thin, active bowel sounds  Musculoskeletal:  -edema  Skin:  Warm, dry, intact   LABS:  BMET Recent Labs  Lab 02/07/18 0807 02/09/18 0925 02/10/18 0322  NA 136 135 135  K 4.4 4.0 4.1  CL 98* 96* 94*  CO2 27 28 32  BUN 18 8 8   CREATININE 0.49* 0.44* 0.42*  GLUCOSE 79 116* 125*    Electrolytes Recent Labs  Lab 02/06/18 2115 02/07/18 0807 02/09/18 0925 02/10/18 0322  CALCIUM  --  8.4* 8.4* 8.4*  MG 1.4* 1.9 1.2* 1.6*  PHOS 3.1 2.6  --   --     CBC Recent Labs  Lab 02/07/18 0807 02/09/18 0925 02/10/18 0322  WBC 9.2 5.5 4.0  HGB 11.2* 11.3* 11.1*  HCT 33.3* 33.5* 33.6*  PLT 172 175 204    Coag's Recent Labs  Lab 02/07/18 0003 02/07/18 0807  APTT 29  --   INR 1.40 1.34    Sepsis Markers Recent Labs  Lab 02/06/18 1658 02/07/18 0003 02/07/18 0300  LATICACIDVEN 2.6* 2.5* 2.0*  PROCALCITON  --  10.50  --     ABG No results for input(s): PHART, PCO2ART, PO2ART in the last 168 hours.  Liver Enzymes Recent Labs  Lab 02/07/18 0807 02/09/18 0925 02/10/18 0322  AST 267* 121* 92*  ALT 134* 86* 73*  ALKPHOS 74 59 60  BILITOT 0.6 0.5 0.5  ALBUMIN 2.7* 2.4* 2.3*    Cardiac Enzymes Recent Labs  Lab  02/06/18 2115 02/07/18 0300 02/07/18 0807  TROPONINI 0.08* 0.06* 0.05*    Glucose Recent Labs  Lab 02/10/18 2353  GLUCAP 260*    Imaging No results found.   STUDIES:  CT Head 5/06 > Normal ventricular morphology. No midline shift or mass effect. Normal appearance of brain parenchyma. No intracranial hemorrhage, mass lesion, or evidence of acute infarction. No extra-axial fluid collections CT C-Spine 5/06 > No acute  CXR 5/06 > Emphysematous hyperinflation of the lungs with new right small to moderate pleural effusion. Reverse cephalization and engorgement of pulmonary vasculature to the lower lobes likely on the basis of COPD. Slightly more confluent airspace opacity at the right lung base cannot exclude the possibility of pneumonia. Heart and mediastinal contours are within normal limits. Remote right-sided sixth rib fracture CTA Chest/ABD 5/07 > 1. No definite CT evidence of pulmonary embolism. 2. Diffuse thickening of the bronchial wall with endobronchial mucous content predominantly involving the lower lobes. Large areas of airspace opacity involving the  lower lobes and lung bases bilaterally, right greater left most consistent with pneumonia possibly related to aspiration. Clinical correlation is recommended. Bilateral pleural effusions, right greater left. 3. No definite acute intra-abdominal or pelvic pathology. 4. Diffuse soft tissue edema and anasarca.  Cachexia. CT Soft Tissue Neck 5/09 > No nodular mucosal enhancement of the oropharynx or tonsils to suggest recurrent disease. Mild smooth low-attenuation mucosal thickening of the oropharynx compatible chronic post radiation changes. No new exophytic mass or abnormal nodular enhancement of the aerodigestive tract.  CULTURES: Blood 5/06 >> MRSA PCR 5/07 > Positive  Sputum 5/10 > Methicillin Resistant Staph Aureus  Tracheal Asp 5/11 >>   ANTIBIOTICS: Azithromycin 5/6 > 5/10 Ceftriaxone 5/06 > 5/09 Bacterium 5/10 >  5/11 Vancomycin 5/11 >> Zosyn 5/11 >>    SIGNIFICANT EVENTS: 5/06 > Presents to ED  5/11 > Transferred to ICU   LINES/TUBES: ETT 5/11 >>  DISCUSSION: 62 year old male presents with MRSA PNA. On 5/11 patient found non-responsive and hypoxic requiring intubation.   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   Transaminase > Improving   P:   NPO PPI Speech Following > Recommend Dysphagia 1 Diet with Nectar Thick Liquids  Acute Hepatitis Panel Pending   HEMATOLOGIC A:   Anemia  H/O Tonsillar Cancer s/p Radiation  P:  Trend CBC  SQ Lovenox   INFECTIOUS A:   MRSA PNA  Aspiration Event  P:   Trend WBC and Fever Curve  Sputum Culture  Trend LA and PCT  Completed Azithromycin  Broaden to Vancomycin and Zosyn given concern for aspiration and elevated PCT  ENDOCRINE A:   Hyperglycemia    P:   Trend Glucose  SSI  Hemoglobin A1C pending   NEUROLOGIC A:   Encephalopathy  -Has had reported seizures in past secondary to withdrawal  H/O ETOH, Polysubstance Abuse (Cocaine)   UDS on arrival + for Opiates/Cocaine/Benzo P:   RASS goal: 0/-1 UDS pending  Wean Fentanyl gtt to achieve RASS PRN Versed  PT/OT   FAMILY  - Updates: No family at bedside   - Inter-disciplinary family meet or Palliative Care meeting due by: 02/18/2018   CC Time: 52 minutes   Hayden Pedro,  AGACNP-BC [facility]  Pgr: [phone]  PCCM Pgr: [phone]

## 2018-02-12 ENCOUNTER — Inpatient Hospital Stay (HOSPITAL_COMMUNITY): Payer: Medicaid Other

## 2018-02-12 DIAGNOSIS — J9600 Acute respiratory failure, unspecified whether with hypoxia or hypercapnia: Secondary | ICD-10-CM

## 2018-02-12 LAB — GLUCOSE, CAPILLARY
GLUCOSE-CAPILLARY: 120 mg/dL — AB (ref 65–99)
GLUCOSE-CAPILLARY: 136 mg/dL — AB (ref 65–99)
GLUCOSE-CAPILLARY: 136 mg/dL — AB (ref 65–99)
GLUCOSE-CAPILLARY: 147 mg/dL — AB (ref 65–99)
GLUCOSE-CAPILLARY: 83 mg/dL (ref 65–99)
GLUCOSE-CAPILLARY: 87 mg/dL (ref 65–99)

## 2018-02-12 LAB — COMPREHENSIVE METABOLIC PANEL
ALBUMIN: 1.9 g/dL — AB (ref 3.5–5.0)
ALK PHOS: 48 U/L (ref 38–126)
ALT: 47 U/L (ref 17–63)
AST: 48 U/L — AB (ref 15–41)
Anion gap: 7 (ref 5–15)
BILIRUBIN TOTAL: 0.6 mg/dL (ref 0.3–1.2)
BUN: 9 mg/dL (ref 6–20)
CALCIUM: 7.1 mg/dL — AB (ref 8.9–10.3)
CO2: 25 mmol/L (ref 22–32)
CREATININE: 0.47 mg/dL — AB (ref 0.61–1.24)
Chloride: 102 mmol/L (ref 101–111)
GFR calc Af Amer: >60 mL/min (ref >60)
GFR calc non Af Amer: >60 mL/min (ref >60)
GLUCOSE: 125 mg/dL — AB (ref 65–99)
Potassium: 2.9 mmol/L — ABNORMAL LOW (ref 3.5–5.1)
Sodium: 134 mmol/L — ABNORMAL LOW (ref 135–145)
TOTAL PROTEIN: 4.9 g/dL — AB (ref 6.5–8.1)

## 2018-02-12 LAB — CBC
HEMATOCRIT: 25.5 % — AB (ref 39.0–52.0)
Hemoglobin: 8.8 g/dL — ABNORMAL LOW (ref 13.0–17.0)
MCH: 31.8 pg (ref 26.0–34.0)
MCHC: 34.5 g/dL (ref 30.0–36.0)
MCV: 92.1 fL (ref 78.0–100.0)
Platelets: 226 10*3/uL (ref 150–400)
RBC: 2.77 MIL/uL — ABNORMAL LOW (ref 4.22–5.81)
RDW: 17.2 % — AB (ref 11.5–15.5)
WBC: 6.5 10*3/uL (ref 4.0–10.5)

## 2018-02-12 LAB — LACTIC ACID, PLASMA: Lactic Acid, Venous: 1.5 mmol/L (ref 0.5–1.9)

## 2018-02-12 LAB — CK TOTAL AND CKMB (NOT AT ARMC)
CK, MB: 3.3 ng/mL (ref 0.5–5.0)
Relative Index: 1.3 (ref 0.0–2.5)
Total CK: 263 U/L (ref 49–397)

## 2018-02-12 MED ORDER — POTASSIUM CHLORIDE 20 MEQ/15ML (10%) PO SOLN
40.0000 meq | Freq: Every day | ORAL | Status: AC
Start: 2018-02-12 — End: 2018-02-12

## 2018-02-12 MED ORDER — POTASSIUM CHLORIDE 20 MEQ/15ML (10%) PO SOLN
ORAL | Status: AC
  Filled 2018-02-12: qty 30

## 2018-02-12 MED ORDER — IPRATROPIUM-ALBUTEROL 0.5-2.5 (3) MG/3ML IN SOLN
3.0000 mL | Freq: Three times a day (TID) | RESPIRATORY_TRACT | Status: DC
  Administered 2018-02-13 – 2018-02-16 (×10): 3 mL via RESPIRATORY_TRACT
  Filled 2018-02-12 (×10): qty 3

## 2018-02-12 MED ORDER — LACTATED RINGERS IV BOLUS
500.0000 mL | Freq: Once | INTRAVENOUS | Status: AC
  Administered 2018-02-12: 500 mL via INTRAVENOUS

## 2018-02-12 MED ORDER — SODIUM CHLORIDE 0.9 % IV SOLN
0.0000 ug/min | INTRAVENOUS | Status: DC
  Administered 2018-02-12: 20 ug/min via INTRAVENOUS
  Filled 2018-02-12: qty 1

## 2018-02-12 MED ORDER — FENTANYL BOLUS VIA INFUSION
25.0000 ug | INTRAVENOUS | Status: DC | PRN
  Administered 2018-02-12: 50 ug via INTRAVENOUS
  Administered 2018-02-12: 25 ug via INTRAVENOUS
  Administered 2018-02-13 (×2): 100 ug via INTRAVENOUS
  Filled 2018-02-12: qty 100

## 2018-02-12 MED ORDER — ORAL CARE MOUTH RINSE
15.0000 mL | Freq: Two times a day (BID) | OROMUCOSAL | Status: DC
Start: 2018-02-12 — End: 2018-02-18
  Administered 2018-02-13 – 2018-02-18 (×10): 15 mL via OROMUCOSAL

## 2018-02-12 MED ORDER — POTASSIUM CHLORIDE 20 MEQ/15ML (10%) PO SOLN: 40.0000 meq | Freq: Every day | ORAL | Status: DC

## 2018-02-12 MED ORDER — POTASSIUM CHLORIDE 20 MEQ/15ML (10%) PO SOLN
ORAL | Status: AC
  Administered 2018-02-12: 40 meq
  Filled 2018-02-12: qty 30

## 2018-02-12 MED ORDER — POTASSIUM CHLORIDE 20 MEQ/15ML (10%) PO SOLN
40.0000 meq | Freq: Once | ORAL | Status: AC
  Administered 2018-02-12: 40 meq via ORAL

## 2018-02-12 MED ORDER — ORAL CARE MOUTH RINSE: 15.0000 mL | Freq: Two times a day (BID) | OROMUCOSAL | Status: DC

## 2018-02-12 MED ORDER — SODIUM CHLORIDE 0.9 % IV SOLN
500.0000 mg | Freq: Two times a day (BID) | INTRAVENOUS | Status: DC
  Administered 2018-02-12 – 2018-02-14 (×5): 500 mg via INTRAVENOUS
  Filled 2018-02-12 (×6): qty 500

## 2018-02-12 MED ORDER — CHLORHEXIDINE GLUCONATE 0.12 % MT SOLN
15.0000 mL | Freq: Two times a day (BID) | OROMUCOSAL | Status: DC
Start: 2018-02-12 — End: 2018-02-18
  Administered 2018-02-14 – 2018-02-18 (×9): 15 mL via OROMUCOSAL
  Filled 2018-02-12 (×9): qty 15

## 2018-02-12 MED ORDER — ENOXAPARIN SODIUM 40 MG/0.4ML ~~LOC~~ SOLN
40.0000 mg | SUBCUTANEOUS | Status: DC
  Administered 2018-02-13: 40 mg via SUBCUTANEOUS
  Filled 2018-02-12: qty 0.4

## 2018-02-12 NOTE — Procedures (Signed)
Arterial Catheter Insertion Procedure Note Russell Williamson [id number] 1956-09-02  Procedure: Insertion of Arterial Catheter  Indications: Blood pressure monitoring and Frequent blood sampling  Procedure Details Consent: Risks of procedure as well as the alternatives and risks of each were explained to the (patient/caregiver).  Consent for procedure obtained. Time Out: Verified patient identification, verified procedure, site/side was marked, verified correct patient position, special equipment/implants available, medications/allergies/relevent history reviewed, required imaging and test results available.  Performed  Maximum sterile technique was used including antiseptics, cap, gloves, gown, hand hygiene, mask and sheet. Skin prep: Chlorhexidine; local anesthetic administered 20 gauge catheter was inserted into right radial artery using the Seldinger technique. ULTRASOUND GUIDANCE USED: NO Evaluation Blood flow good; BP tracing good. Complications: No apparent complications.   Russell Williamson 02/12/2018

## 2018-02-12 NOTE — Progress Notes (Signed)
Pharmacy Antibiotic Note  Russell Williamson is a 62 y.o. male admitted on 02/06/2018 with pneumonia.  Pharmacy has been consulted for Vancomycin/Zosyn dosing for possible aspiration PNA as well as known MRSA PNA. Tmax is 100.9 and WBC is WNL. SCr down to 0.47.   Sputum culture from 5/7 with MRSA  Plan: Change vancomycin to 500mg  IV Q12H Continue zosyn 3.375gm IV Q8H (4 hr inf) F/u renal fxn, C&S, clinical status and trough at SS   Height: 5\' 9"  (175.3 cm) Weight: 111 lb 15.9 oz (50.8 kg) IBW/kg (Calculated) : 70.7  Temp (24hrs), Avg:99 F (37.2 C), Min:97.5 F (36.4 C), Max:100.9 F (38.3 C)  Recent Labs  Lab 02/07/18 0300  02/09/18 0925 02/10/18 0322 02/11/18 0044 02/11/18 0727 02/11/18 1812 02/12/18 0413  WBC  --    < > 5.5 4.0 6.1 7.5  --  6.5  CREATININE  --    < > 0.44* 0.42* 0.69 0.62 0.61 0.47*  LATICACIDVEN 2.0*  --   --   --  3.4* 5.2* 3.8* 1.5   < > = values in this interval not displayed.    Estimated Creatinine Clearance: 69.7 mL/min (A) (by C-G formula based on SCr of 0.47 mg/dL (L)).    No Known Allergies   Russell Williamson, Russell Williamson 02/12/2018 12:40 PM

## 2018-02-12 NOTE — Procedures (Signed)
Extubation Procedure Note  Patient Details:   Name: Russell Williamson DOB: 03-16-1956 MRN: [id number]   Airway Documentation:    Vent end date: 02/12/18 Vent end time: 1446   Evaluation  O2 sats: stable throughout Complications: No apparent complications Patient did tolerate procedure well. Bilateral Breath Sounds: Diminished   Yes   Pt extubated to N/C.  Pt had good cuff leak.  No stridor noted.  RN @ bedside.  Donnetta Hail 02/12/2018, 2:49 PM

## 2018-02-12 NOTE — Progress Notes (Signed)
Call from RN - doing well oon 5/5  Plan Extubate Wean fent gtt to lowest possible and then prn Wean neo as tolertaed to off   Dr. Brand Males, M.D., [facility].C.P Pulmonary and Critical Care Medicine Staff Physician, [place] Director - Interstitial Lung Disease  Program  Pulmonary [place] at [place], [place], [address]  Pager: [phone], If no answer or between  15:00h - 7:00h: call [phone] Telephone: [phone]

## 2018-02-12 NOTE — Progress Notes (Signed)
PULMONARY / CRITICAL CARE MEDICINE   Name: Russell Williamson MRN: [id number] DOB: 12-10-1955    ADMISSION DATE:  02/06/2018 CONSULTATION DATE:  02/11/2018  REFERRING MD:  Silas Sacramento, NP   CHIEF COMPLAINT:  AMS  HISTORY OF PRESENT ILLNESS:   62 year old male with PMH of COPD, GERD, Tonsillar Cancer s/p Radiation, Dysphagia, Polysubstance Abuse, ETOH, HTN  Presents to ED on 5/06 with Fever and Cough. Found to have MRSA PNA. On Bacterium. Plans to be discharged on 5/10, however, per nursing staff patient left unit for a couple of hours and then came back stating that he had no ride and couldn't leave. Nurse reported giving patient medications at 2130 included Oxy 15 mg and Remeron). At midnight patient was found to be hypoxic and non-responsive. Given 0.4 mg Narcan without improvement. Intubated and transferred to ICU.    SUBJECTIVE:   Hypotension overnight with precedex , Pressors started  Changed back to fent.  B/p/MAP improved this am  Very alert today on vent following commands.   VITAL SIGNS: BP 90/62   Pulse 89   Temp 97.9 F (36.6 C)   Resp (!) 24   Ht 5\' 9"  (1.753 m)   Wt 111 lb 15.9 oz (50.8 kg)   SpO2 100%   BMI 16.54 kg/m   HEMODYNAMICS:    VENTILATOR SETTINGS: Vent Mode: PRVC FiO2 (%):  [40 %-50 %] 40 % Set Rate:  [22 bmp] 22 bmp Vt Set:  [560 mL] 560 mL PEEP:  [5 cmH20] 5 cmH20 Plateau Pressure:  [18 cmH20-24 cmH20] 19 cmH20  INTAKE / OUTPUT: I/O last 3 completed shifts: In: 6990.9 [P.O.:120; I.V.:2543.6; NG/GT:677.3; IV Piggyback:3650] Out: 7124 [Urine:1470]  PHYSICAL EXAMINATION: General: Thin and cachectic on ventilator Neuro: Alert and follows commands  HEENT: Dry mucosa, ET tube in place Cardiovascular: Regular rate and rhythm with no murmur rub or gallop Lungs: Diminished breath sounds in the bases Abdomen: Thin active bowel sounds Musculoskeletal: No edema  skin: Warm dry and intact  LABS:  BMET Recent Labs  Lab 02/11/18 0727  02/11/18 1812 02/12/18 0413  NA 134* 135 134*  K 5.5* 3.7 2.9*  CL 94* 98* 102  CO2 26 28 25   BUN 12 9 9   CREATININE 0.62 0.61 0.47*  GLUCOSE 80 164* 125*    Electrolytes Recent Labs  Lab 02/07/18 0807  02/10/18 0322 02/11/18 0044 02/11/18 0727 02/11/18 1812 02/12/18 0413  CALCIUM 8.4*   < > 8.4* 8.4* 8.0* 7.3* 7.1*  MG 1.9   < > 1.6* 1.6* 1.9  --   --   PHOS 2.6  --   --  5.4* 2.7  --   --    < > = values in this interval not displayed.    CBC Recent Labs  Lab 02/11/18 0044 02/11/18 0727 02/12/18 0413  WBC 6.1 7.5 6.5  HGB 11.5* 10.6* 8.8*  HCT 35.0* 31.9* 25.5*  PLT 266 222 226    Coag's Recent Labs  Lab 02/07/18 0003 02/07/18 0807  APTT 29  --   INR 1.40 1.34    Sepsis Markers Recent Labs  Lab 02/07/18 0003  02/11/18 0044 02/11/18 0727 02/11/18 1812 02/12/18 0413  LATICACIDVEN 2.5*   < > 3.4* 5.2* 3.8* 1.5  PROCALCITON 10.50  --  0.86  --   --   --    < > = values in this interval not displayed.    ABG Recent Labs  Lab 02/11/18 0051 02/11/18 0347  PHART 7.280* 7.519*  PCO2ART 62.7* 36.2  PO2ART 121.0* 65.7*    Liver Enzymes Recent Labs  Lab 02/10/18 0322 02/11/18 0727 02/12/18 0413  AST 92* 85* 48*  ALT 73* 55 47  ALKPHOS 60 52 48  BILITOT 0.5 1.5* 0.6  ALBUMIN 2.3* 2.4* 1.9*    Cardiac Enzymes Recent Labs  Lab 02/11/18 0044 02/11/18 0727 02/11/18 1246  TROPONINI 0.13* 0.07* 0.05*    Glucose Recent Labs  Lab 02/11/18 1614 02/11/18 1725 02/11/18 2006 02/12/18 0036 02/12/18 0421 02/12/18 0747  GLUCAP 52* 131* 137* 136* 120* 147*    Imaging Dg Chest Port 1 View  Result Date: 02/12/2018 CLINICAL DATA:  Respiratory failure, history tonsillar cancer, COPD, GERD, smoker EXAM: PORTABLE CHEST 1 VIEW COMPARISON:  Portable exam 0704 hours compared to 02/11/2018 FINDINGS: Tip of endotracheal tube is at the level of the aortic arch, likely above carina though the carina is not adequately visualized. Nasogastric tube  extends into stomach. Normal heart size, mediastinal contours, and pulmonary vascularity. Emphysematous changes with bibasilar infiltrates and pleural effusions. Upper lungs clear. No pneumothorax. IMPRESSION: Bibasilar pulmonary infiltrates, slightly improved. Persistent bibasilar pleural effusions. Underlying emphysematous changes. Electronically Signed   By: Lavonia Dana M.D.   On: 02/12/2018 08:15     STUDIES:  CT Head 5/06 > Normal ventricular morphology. No midline shift or mass effect. Normal appearance of brain parenchyma. No intracranial hemorrhage, mass lesion, or evidence of acute infarction. No extra-axial fluid collections CT C-Spine 5/06 > No acute  CXR 5/06 > Emphysematous hyperinflation of the lungs with new right small to moderate pleural effusion. Reverse cephalization and engorgement of pulmonary vasculature to the lower lobes likely on the basis of COPD. Slightly more confluent airspace opacity at the right lung base cannot exclude the possibility of pneumonia. Heart and mediastinal contours are within normal limits. Remote right-sided sixth rib fracture CTA Chest/ABD 5/07 > 1. No definite CT evidence of pulmonary embolism. 2. Diffuse thickening of the bronchial wall with endobronchial mucous content predominantly involving the lower lobes. Large areas of airspace opacity involving the lower lobes and lung bases bilaterally, right greater left most consistent with pneumonia possibly related to aspiration. Clinical correlation is recommended. Bilateral pleural effusions, right greater left. 3. No definite acute intra-abdominal or pelvic pathology. 4. Diffuse soft tissue edema and anasarca.  Cachexia. CT Soft Tissue Neck 5/09 > No nodular mucosal enhancement of the oropharynx or tonsils to suggest recurrent disease. Mild smooth low-attenuation mucosal thickening of the oropharynx compatible chronic post radiation changes. No new exophytic mass or abnormal nodular enhancement of  the aerodigestive tract.  CULTURES: Blood 5/06 >> negative MRSA PCR 5/07 > Positive  Sputum 5/10 > Methicillin Resistant Staph Aureus  Tracheal Asp 5/11 >>   ANTIBIOTICS: Azithromycin 5/6 > 5/10 Ceftriaxone 5/06 > 5/09 Bacterium 5/10 > 5/11 Vancomycin 5/11 >> Zosyn 5/11 >>    SIGNIFICANT EVENTS: 5/06 > Presents to ED  5/11 > Transferred to ICU   LINES/TUBES: ETT 5/11 >>  DISCUSSION: 62 year old male presents with MRSA PNA. On 5/11 patient found non-responsive and hypoxic requiring intubation.   ASSESSMENT / PLAN:  PULMONARY A: Acute Hypoxic/Hypercarbic Respiratory Failure  Respiratory Acidosis  MRSA PNA  H/O COPD  P:   Continue vent support VAP Daily SBT and evaluate for wean  Continue on bronchodilators Trend chest x-ray and ABG  CARDIOVASCULAR A:  H/O HTN Hypotension -pressors support 5/11  -CK on 5/06 1,062>694  Troponin tr down  P:  Cardiac Monitoring  Trend Troponin  Wean  pressors for MAP >65    RENAL A:   Anion Gap Metabolic Acidosis with Lactic Acidosis  Hypomagnesemia - improved  Hypokalemia  5/12 LA tr down   P:   Trend BMP  Replace electrolytes as indicated   Replace K    GASTROINTESTINAL A:   GERD 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  Acute Hepatitis Panel Pending   HEMATOLOGIC A:   Anemia  H/O Tonsillar Cancer s/p Radiation  P:  Trend CBC  SQ Lovenox   INFECTIOUS A:   MRSA PNA  Aspiration Event  PCT tr down 10 >0.8  P:   Trend WBC and Fever Curve  Sputum Culture pend   cont on Vanc /zosyn -narrow as able   ENDOCRINE A:   Hyperglycemia   A1 C 6.2  5/11 , Hypoglycemia episode s/p D50 P:   Trend Glucose  SSI     NEUROLOGIC A:   Encephalopathy  -Has had reported seizures in past secondary to withdrawal  H/O ETOH, Polysubstance Abuse (Cocaine)   UDS on arrival + for  Opiates/Cocaine/Benzo P:   RASS goal: 0/-1 UDS pending  Wean Fentanyl gtt to achieve RASS PRN Versed  PT/OT   FAMILY  - Updates: No family at bedside  - Inter-disciplinary family meet or Palliative Care meeting due by: 02/18/2018   Cecely Rengel NP-C  [facility]  [phone]   02/12/2018

## 2018-02-13 ENCOUNTER — Inpatient Hospital Stay (HOSPITAL_COMMUNITY): Payer: Medicaid Other

## 2018-02-13 DIAGNOSIS — G9340 Encephalopathy, unspecified: Secondary | ICD-10-CM

## 2018-02-13 DIAGNOSIS — J9602 Acute respiratory failure with hypercapnia: Secondary | ICD-10-CM

## 2018-02-13 DIAGNOSIS — J181 Lobar pneumonia, unspecified organism: Secondary | ICD-10-CM

## 2018-02-13 DIAGNOSIS — J449 Chronic obstructive pulmonary disease, unspecified: Secondary | ICD-10-CM

## 2018-02-13 LAB — POCT I-STAT 3, ART BLOOD GAS (G3+)
ACID-BASE DEFICIT: 5 mmol/L — AB (ref 0.0–2.0)
Acid-base deficit: 8 mmol/L — ABNORMAL HIGH (ref 0.0–2.0)
BICARBONATE: 21.4 mmol/L (ref 20.0–28.0)
Bicarbonate: 21.6 mmol/L (ref 20.0–28.0)
O2 SAT: 91 %
O2 Saturation: 95 %
PCO2 ART: 61.5 mmHg — AB (ref 32.0–48.0)
PH ART: 7.152 — AB (ref 7.350–7.450)
PO2 ART: 80 mmHg — AB (ref 83.0–108.0)
PO2 ART: 87 mmHg (ref 83.0–108.0)
Patient temperature: 36.9
Patient temperature: 98.6
TCO2: 23 mmol/L (ref 22–32)
TCO2: 23 mmol/L (ref 22–32)
pCO2 arterial: 45.7 mmHg (ref 32.0–48.0)
pH, Arterial: 7.278 — ABNORMAL LOW (ref 7.350–7.450)

## 2018-02-13 LAB — CBC
HCT: 30.8 % — ABNORMAL LOW (ref 39.0–52.0)
HEMOGLOBIN: 10.1 g/dL — AB (ref 13.0–17.0)
MCH: 31.8 pg (ref 26.0–34.0)
MCHC: 32.8 g/dL (ref 30.0–36.0)
MCV: 96.9 fL (ref 78.0–100.0)
Platelets: 275 10*3/uL (ref 150–400)
RBC: 3.18 MIL/uL — AB (ref 4.22–5.81)
RDW: 18 % — ABNORMAL HIGH (ref 11.5–15.5)
WBC: 9.7 10*3/uL (ref 4.0–10.5)

## 2018-02-13 LAB — BASIC METABOLIC PANEL
Anion gap: 6 (ref 5–15)
BUN: 8 mg/dL (ref 6–20)
CHLORIDE: 107 mmol/L (ref 101–111)
CO2: 25 mmol/L (ref 22–32)
CREATININE: 0.52 mg/dL — AB (ref 0.61–1.24)
Calcium: 7.7 mg/dL — ABNORMAL LOW (ref 8.9–10.3)
GFR calc Af Amer: >60 mL/min (ref >60)
GFR calc non Af Amer: >60 mL/min (ref >60)
Glucose, Bld: 98 mg/dL (ref 65–99)
Potassium: 4 mmol/L (ref 3.5–5.1)
SODIUM: 138 mmol/L (ref 135–145)

## 2018-02-13 LAB — GLUCOSE, CAPILLARY
GLUCOSE-CAPILLARY: 92 mg/dL (ref 65–99)
Glucose-Capillary: 103 mg/dL — ABNORMAL HIGH (ref 65–99)
Glucose-Capillary: 70 mg/dL (ref 65–99)
Glucose-Capillary: 79 mg/dL (ref 65–99)
Glucose-Capillary: 80 mg/dL (ref 65–99)
Glucose-Capillary: 93 mg/dL (ref 65–99)

## 2018-02-13 LAB — MAGNESIUM: MAGNESIUM: 1.4 mg/dL — AB (ref 1.7–2.4)

## 2018-02-13 LAB — PHOSPHORUS: Phosphorus: 3 mg/dL (ref 2.5–4.6)

## 2018-02-13 MED ORDER — NALOXONE HCL 0.4 MG/ML IJ SOLN: 0.4000 mg | INTRAMUSCULAR | Status: DC | PRN

## 2018-02-13 MED ORDER — ENOXAPARIN SODIUM 300 MG/3ML IJ SOLN
25.0000 mg | INTRAMUSCULAR | Status: DC
  Filled 2018-02-13: qty 0.25

## 2018-02-13 MED ORDER — SODIUM CHLORIDE 0.9% FLUSH: 9.0000 mL | INTRAVENOUS | Status: DC | PRN

## 2018-02-13 MED ORDER — POTASSIUM CHLORIDE 10 MEQ/100ML IV SOLN: 10.0000 meq | INTRAVENOUS | Status: DC

## 2018-02-13 MED ORDER — FENTANYL 40 MCG/ML IV SOLN
INTRAVENOUS | Status: DC
  Administered 2018-02-13: 1000 ug via INTRAVENOUS
  Filled 2018-02-13 (×2): qty 25

## 2018-02-13 MED ORDER — OXYCODONE HCL 5 MG PO TABS
5.0000 mg | ORAL_TABLET | Freq: Three times a day (TID) | ORAL | Status: DC | PRN
  Administered 2018-02-13 – 2018-02-15 (×3): 5 mg via ORAL
  Filled 2018-02-13 (×3): qty 1

## 2018-02-13 MED ORDER — SODIUM CHLORIDE 0.9 % IV SOLN
1.0000 g | INTRAVENOUS | Status: DC
  Administered 2018-02-13 – 2018-02-14 (×2): 1 g via INTRAVENOUS
  Filled 2018-02-13 (×3): qty 10

## 2018-02-13 MED ORDER — FENTANYL CITRATE (PF) 100 MCG/2ML IJ SOLN
25.0000 ug | INTRAMUSCULAR | Status: DC | PRN
  Administered 2018-02-13 (×2): 25 ug via INTRAVENOUS
  Filled 2018-02-13 (×2): qty 2

## 2018-02-13 MED ORDER — DEXTROSE-NACL 5-0.9 % IV SOLN
INTRAVENOUS | Status: DC
  Administered 2018-02-13 – 2018-02-15 (×2): via INTRAVENOUS

## 2018-02-13 NOTE — Progress Notes (Addendum)
  Speech Language Pathology Treatment: Dysphagia  Patient Details Name: Russell Williamson MRN: [id number] DOB: 07/13/1956 Today's Date: 02/13/2018 Time: 1601-0932 SLP Time Calculation (min) (ACUTE ONLY): 20 min  Assessment / Plan / Recommendation Clinical Impression  Pt was seen to assess readiness to restart POs given intubation over the weekend (5/11-5/12), difficulty breathing this morning requiring BiPAP. Pt denies any subjective changes in swallowing function, although he also says that he is having trouble clearing the pureed food from his mouth and throat - something that he did not have difficulty with last week. He has increased throat clearing and delayed coughing as well, with expectoration of mild-moderate amounts of thick secretions that appear to be mixed with POs. Pt has reduced recall of MBS results/recommendations, poor awareness of baseline dysphagia and risks for aspiration. Given his high risk for aspiration prior to intubation, risk for increased difficulty swallowing post-extubation, and clinical change in function, recommend keeping pt strictly NPO pending completion of repeat MBS - likely to be done on next date.   HPI HPI: Pt is a 62 y.o. male admitted s/p fall (questionable LOC); CXR showed RLL PNA, CT Head negative for acute changes. 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        SLP Plan  MBS       Recommendations  Diet recommendations: NPO Medication Administration: Via alternative means                Oral Care Recommendations: Oral care QID Follow up Recommendations: (tba) SLP Visit Diagnosis: Dysphagia, pharyngoesophageal phase  (R13.14) Plan: MBS       GO                [name] 02/13/2018, 3:26 PM  [name], M.A. CCC-SLP [phone]

## 2018-02-13 NOTE — Progress Notes (Signed)
02/13/18  1100 hrs: 200 mL of IV Fentanyl wasted in the sink. Markus Daft, RN witnessed.

## 2018-02-13 NOTE — Progress Notes (Addendum)
PULMONARY / CRITICAL CARE MEDICINE   Name: Russell Williamson MRN: [id number] DOB: [birth year]    ADMISSION DATE:  02/06/2018 CONSULTATION DATE:  02/11/2018  REFERRING MD:  Silas Sacramento, NP   CHIEF COMPLAINT:  AMS  HISTORY OF PRESENT ILLNESS:   63 year old male with PMH of COPD, GERD, Tonsillar Cancer s/p Radiation, Dysphagia, Polysubstance Abuse, ETOH, HTN  Presents to ED on 5/06 with Fever and Cough. Found to have MRSA PNA. On Bacterium. Plans to be discharged on 5/10, however, per nursing staff patient left unit for a couple of hours and then came back stating that he had no ride and couldn't leave. Nurse reported giving patient medications at 2130 included Oxy 15 mg and Remeron). At midnight patient was found to be hypoxic and non-responsive. Given 0.4 mg Narcan without improvement. Intubated and transferred to ICU.   SUBJECTIVE:   Extubated 5/12 Left on low dose fentanyl for chronic pain, received several boluses of fentanyl for pain Developed respiratory distress this am and hypoxic to 60's on NRB requiring BiPAP Bilateral PIV infiltrated, currently no IV access  C/o of shortness of breath and right rib pain  VITAL SIGNS: BP 123/82   Pulse 68   Temp 97.7 F (36.5 C)   Resp 14   Ht 5\' 9"  (1.753 m)   Wt 111 lb 15.9 oz (50.8 kg)   SpO2 94%   BMI 16.54 kg/m   HEMODYNAMICS:    VENTILATOR SETTINGS: Vent Mode: CPAP;PSV FiO2 (%):  [40 %] 40 % Set Rate:  [22 bmp] 22 bmp Vt Set:  [560 mL] 560 mL PEEP:  [5 cmH20] 5 cmH20 Pressure Support:  [5 cmH20-10 cmH20] 5 cmH20 Plateau Pressure:  [19 cmH20] 19 cmH20  INTAKE / OUTPUT: I/O last 3 completed shifts: In: 4302.2 [I.V.:3122.2; NG/GT:680; IV Piggyback:500] Out: 1885 [Urine:1885]  PHYSICAL EXAMINATION: General:  Thin adult male lying in bed on BiPAP in NAD HEENT: pupils 4/equal/ reactive, no JVD, well-fitting mask Neuro:  Alert, oriented, f/c, MAE CV:  SR, 2+ pulses, no murmur PULM: even/mildly labored on BIPAP, rr 24,  anterior rhonchi bilaterally, diminished in bases GI: thin, soft, non-tender, bs active  Extremities: warm/dry, no BLE edema, bilateral upper extremity IV inflitrated Skin: no rashes   LABS:  BMET Recent Labs  Lab 02/11/18 0727 02/11/18 1812 02/12/18 0413  NA 134* 135 134*  K 5.5* 3.7 2.9*  CL 94* 98* 102  CO2 26 28 25   BUN 12 9 9   CREATININE 0.62 0.61 0.47*  GLUCOSE 80 164* 125*    Electrolytes Recent Labs  Lab 02/07/18 0807  02/10/18 0322 02/11/18 0044 02/11/18 0727 02/11/18 1812 02/12/18 0413  CALCIUM 8.4*   < > 8.4* 8.4* 8.0* 7.3* 7.1*  MG 1.9   < > 1.6* 1.6* 1.9  --   --   PHOS 2.6  --   --  5.4* 2.7  --   --    < > = values in this interval not displayed.    CBC Recent Labs  Lab 02/11/18 0044 02/11/18 0727 02/12/18 0413  WBC 6.1 7.5 6.5  HGB 11.5* 10.6* 8.8*  HCT 35.0* 31.9* 25.5*  PLT 266 222 226    Coag's Recent Labs  Lab 02/07/18 0003 02/07/18 0807  APTT 29  --   INR 1.40 1.34    Sepsis Markers Recent Labs  Lab 02/07/18 0003  02/11/18 0044 02/11/18 0727 02/11/18 1812 02/12/18 0413  LATICACIDVEN 2.5*   < > 3.4* 5.2* 3.8* 1.5  PROCALCITON 10.50  --  0.86  --   --   --    < > = values in this interval not displayed.    ABG Recent Labs  Lab 02/11/18 0051 02/11/18 0347  PHART 7.280* 7.519*  PCO2ART 62.7* 36.2  PO2ART 121.0* 65.7*    Liver Enzymes Recent Labs  Lab 02/10/18 0322 02/11/18 0727 02/12/18 0413  AST 92* 85* 48*  ALT 73* 55 47  ALKPHOS 60 52 48  BILITOT 0.5 1.5* 0.6  ALBUMIN 2.3* 2.4* 1.9*    Cardiac Enzymes Recent Labs  Lab 02/11/18 0044 02/11/18 0727 02/11/18 1246  TROPONINI 0.13* 0.07* 0.05*    Glucose Recent Labs  Lab 02/12/18 0747 02/12/18 1123 02/12/18 1635 02/12/18 2023 02/13/18 0036 02/13/18 0349  GLUCAP 147* 136* 87 83 79 70    Imaging Dg Chest Port 1 View  Result Date: 02/12/2018 CLINICAL DATA:  Respiratory failure, history tonsillar cancer, COPD, GERD, smoker EXAM: PORTABLE  CHEST 1 VIEW COMPARISON:  Portable exam 0704 hours compared to 02/11/2018 FINDINGS: Tip of endotracheal tube is at the level of the aortic arch, likely above carina though the carina is not adequately visualized. Nasogastric tube extends into stomach. Normal heart size, mediastinal contours, and pulmonary vascularity. Emphysematous changes with bibasilar infiltrates and pleural effusions. Upper lungs clear. No pneumothorax. IMPRESSION: Bibasilar pulmonary infiltrates, slightly improved. Persistent bibasilar pleural effusions. Underlying emphysematous changes. Electronically Signed   By: Lavonia Dana M.D.   On: 02/12/2018 08:15   STUDIES:  CT Head 5/06 > Normal ventricular morphology. No midline shift or mass effect. Normal appearance of brain parenchyma. No intracranial hemorrhage, mass lesion, or evidence of acute infarction. No extra-axial fluid collections CT C-Spine 5/06 > No acute  CXR 5/06 > Emphysematous hyperinflation of the lungs with new right small to moderate pleural effusion. Reverse cephalization and engorgement of pulmonary vasculature to the lower lobes likely on the basis of COPD. Slightly more confluent airspace opacity at the right lung base cannot exclude the possibility of pneumonia. Heart and mediastinal contours are within normal limits. Remote right-sided sixth rib fracture CTA Chest/ABD 5/07 > 1. No definite CT evidence of pulmonary embolism. 2. Diffuse thickening of the bronchial wall with endobronchial mucous content predominantly involving the lower lobes. Large areas of airspace opacity involving the lower lobes and lung bases bilaterally, right greater left most consistent with pneumonia possibly related to aspiration. Clinical correlation is recommended. Bilateral pleural effusions, right greater left. 3. No definite acute intra-abdominal or pelvic pathology. 4. Diffuse soft tissue edema and anasarca.  Cachexia. CT Soft Tissue Neck 5/09 > No nodular mucosal  enhancement of the oropharynx or tonsils to suggest recurrent disease. Mild smooth low-attenuation mucosal thickening of the oropharynx compatible chronic post radiation changes. No new exophytic mass or abnormal nodular enhancement of the aerodigestive tract.  CULTURES: Blood 5/06 >> negative MRSA PCR 5/07 > Positive  Sputum 5/10 > Methicillin Resistant Staph Aureus  Tracheal Asp 5/11 >>   ANTIBIOTICS: Azithromycin 5/6 > 5/10 Ceftriaxone 5/06 > 5/09; 5/13 >> Bacterium 5/10 > 5/11 Vancomycin 5/11 >> Zosyn 5/11 >> 5/13  SIGNIFICANT EVENTS: 5/06 > Presents to ED  5/11 > Transferred to ICU  5/12 > extubated  LINES/TUBES: ETT 5/11 >> 5/12 PIV 5/13- R EJ  DISCUSSION: 62 year old male presents with MRSA PNA. On 5/11 patient found non-responsive and hypoxic requiring intubation. Extubated 5/12, remained on low dose fentanyl for chronic pain with several boluses overnight.  Developed resp distress and hypoxia on 5/13 am requiring BiPAP  ASSESSMENT /  PLAN:  PULMONARY A: Acute Hypoxic/Hypercarbic Respiratory Failure  Respiratory Acidosis  MRSA PNA  H/O COPD  - CXR 5/13 with bilateral infiltrates, slightly worse on R and bilateral pleural effusions  P:   At risk for intubation Continue BIPAP for now; breathing and sats greatly improved, trial off in several hours if tolerates ABG improving on BiPAP, no emergent need for intubation at this time CXR in am  Continue on bronchodilators Pulm hygiene See ID    CARDIOVASCULAR A:  H/O HTN Hypotension -pressors support 5/11 - resolved Elevated troponin- mild elevation - peaked 5/11 at 0.13 - CK on 5/06 0,737>106 > 263 - 06/2017 TTE EF 65%, no wall abnormalites P:  Cardiac Monitoring  Goal MAP  > 65  RENAL A:   Anion Gap Metabolic Acidosis with Lactic Acidosis - resolved Hypomagnesemia - improved  Hypokalemia  - + 8.5 L , weight up 15 lbs P:   Reduce D5/ 1/2 NS to 30 ml/hr Consider gentle diuresis if BP remains normotensive  and depending on K BMET, mag and phos pending Trend BMP / urinary output/ daily wts D/c foley  Replace electrolytes as indicated   GASTROINTESTINAL A:   GERD 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  Acute Hepatitis Panel Pending from 5/9  HEMATOLOGIC A:   Anemia  H/O Tonsillar Cancer s/p Radiation  P:  Trend CBC  SQ Lovenox   INFECTIOUS A:   MRSA PNA  Aspiration Event  PCT tr down 10 >0.8  P:   Trend WBC and Fever Curve  Sputum Culture pend  cont on Vanc  D/c zosyn, change to ceftriaxone for aspiration coverage  ENDOCRINE A:   Hyperglycemia   A1 C 6.2  5/11 , Hypoglycemia  P:   Trend Glucose q 4  D/c SSI  IVF as above    NEUROLOGIC A:   Encephalopathy  -Has had reported seizures in past secondary to withdrawal  H/O ETOH, Polysubstance Abuse (Cocaine)   UDS on arrival + for Opiates/Cocaine/Benzo; repeat 5/11 +opiates/ benzo Chronic pain P:   Fentanyl gtt d/c Switched to PCA reduced dose fentanyl with prn Narcan When off BiPAP can change to PO/ home regimen PT/OT   FAMILY  - Updates: No family at bedside.  Patient updated on plan of care  - Inter-disciplinary family meet or Palliative Care meeting due by: 02/18/2018  CCT 40 mins  Kennieth Rad, AGACNP-BC [facility] Pgr: [phone] or if no answer [phone] 02/13/2018, 9:17 AM  Attending Note:  62 year old male with respiratory failure due to likely overdose who is now in respiratory failure due to narcotics.  Patient was extubated but now is in acute hypercarbic respiratory failure.  Narcan given and patient woke up and was able to protect his airway.  Placed on BiPAP with some improvement but continues to complain of pain.  On exam, decreased BS diffusely.  I reviewed CXR myself, no acute disease noted.  Discussed with PCCM-NP and bedside RN.  Patient is on  long acting narcotics for chronic pain at home.  PCA dosing did not work for patient.  Will give some oxy IR here scheduled when more awake for pain and fentanyl pushes for pain.  Maintain on BiPAP until more awake then will d/c.  Will need to come back and re-evaluate patient.  The patient is critically ill with multiple organ systems  failure and requires high complexity decision making for assessment and support, frequent evaluation and titration of therapies, application of advanced monitoring technologies and extensive interpretation of multiple databases.   Critical Care Time devoted to patient care services described in this note is  36  Minutes. This time reflects time of care of this signee Dr Jennet Maduro. This critical care time does not reflect procedure time, or teaching time or supervisory time of PA/NP/Med student/Med Resident etc but could involve care discussion time.  Rush Farmer, M.D. [facility]. Pager: [phone]. After hours pager: [phone].

## 2018-02-13 NOTE — Progress Notes (Signed)
Nutrition Follow-up  DOCUMENTATION CODES:   Underweight, Severe malnutrition in context of chronic illness  INTERVENTION:   -RD will follow for diet advancement and supplement as appropriate -If pt unable to safely advance to PO diet, consider initiate of nutrition support:  Initiate Osmolite 1.2 @ 25 ml/hr and increase by 10 ml every 8 hours to goal rate of 55 ml/hr.   30 ml Prostat daily.    Tube feeding regimen provides 1684 kcal (100% of needs), 88 grams of protein, and 1082 ml of H2O.   NUTRITION DIAGNOSIS:   Severe Malnutrition related to chronic illness(chronic pancreatitis, COPD, hx of throat cancer) as evidenced by severe muscle depletion, severe fat depletion.  Ongoing  GOAL:   Patient will meet greater than or equal to 90% of their needs  Unmet  MONITOR:   Diet advancement, Vent status, Weight trends, TF tolerance, I & O's  REASON FOR ASSESSMENT:   Ventilator Assessment of nutrition requirement/status  ASSESSMENT:   62 yo Male with PMH significant of throat cancer, polysubstance abuse, anemia, osteoarthritis, cervical adenopathy, chronic lower back pain, chronic pancreatitis, constipation, chronic dyspnea, GERD, COPD, HTN.  5/12- extubated 5/13- per SLP, continue NPO status (prior to intubation, pt was high risk for aspiration on all textures)  Reviewed I/O's: +943 ml x 24 hours and +8.4 L since admission.   Pt on Bi-pap earlier today, but has since progressed to nasal cannula.   Per SLP notes, plan to remain NPO for today. Plan for MBSS tomorrow.   Medications reviewed and include folic acid, creon, and thiamine.   Labs reviewed: CBGS: 103, Mg: 1.4. K and Phos WDL.   Diet Order:   Diet Order           Diet NPO time specified  Diet effective now        DIET - DYS 1          EDUCATION NEEDS:   No education needs have been identified at this time  Skin:  Skin Assessment: Skin Integrity Issues: Skin Integrity Issues:: Stage III Stage III:  Sacrum  Last BM:  02/12/18  Height:   Ht Readings from Last 1 Encounters:  02/07/18 5\' 9"  (1.753 m)    Weight:   Wt Readings from Last 1 Encounters:  02/12/18 111 lb 15.9 oz (50.8 kg)    Ideal Body Weight:  72.7 kg  BMI:  Body mass index is 16.54 kg/m.  Estimated Nutritional Needs:   Kcal:  1600-1800  Protein:  85-100 grams  Fluid:  > 1.6 L    Giulianna Rocha A. Jimmye Norman, RD, LDN, CDE Pager: [phone] After hours Pager: [phone]

## 2018-02-13 NOTE — Plan of Care (Signed)
  Problem: Respiratory: Goal: Ability to maintain a clear airway and adequate ventilation will improve Outcome: Completed/Met Note:  Patient extubated on day shift 5/12.    Problem: Health Behavior/Discharge Planning: Goal: Ability to manage health-related needs will improve Outcome: Progressing Note:  Patient was previously discharged from hospital and returned. At discharge was supposed to be picked up by niece and have [place]. Will follow up at this discharge.    Problem: Education: Goal: Knowledge of General Education information will improve Outcome: Progressing   Problem: Skin Integrity: Goal: Risk for impaired skin integrity will decrease Outcome: Progressing Note:  Patient turned at intervals. Able to turn self in bed, but encouraged to do so.    Problem: Nutrition: Goal: Adequate nutrition will be maintained Outcome: Not Progressing Note:  Patient extubated and has not yet been evaluated for swallowing. Patient intubated after likely aspiration. Required D5NS gtt to be started d/t falling blood sugars overnight.    Problem: Pain Managment: Goal: General experience of comfort will improve Outcome: Not Progressing Note:  Patient has chronic pain and although he remains on fentanyl gtt at 40mg/hr; he is still requesting pain medication every 2 hours or less. Patient states that he takes oxycodone every 4 hours usually. However, he is continuing to have abdominal pain, which was discussed with prior shift.    Problem: Respiratory: Goal: Ability to maintain a clear airway and adequate ventilation will improve Outcome: Completed/Met Note:  Patient extubated on day shift 5/12.

## 2018-02-13 NOTE — Progress Notes (Signed)
02/13/18  1400 hrs: PCA is discontinued. 40 mcg, 4 doses given, 4 doses demanded. 18 mL of Fentanyl was wasted in the sink; Daron Offer, RN witnessed.

## 2018-02-13 NOTE — Progress Notes (Signed)
02/13/18  1010 hrs: PCA Fentanyl initiated at this time, witnessed by Markus Daft, RN and Delorise Jackson, RN. We are unable to monitor the patient's EtCO2 at this time because the patient remains on BiPAP. Per RT, there is no in-line option for EtCO2 monitoring for BiPAP. The ordering provider, Kennieth Rad, NP, has been made aware we are unable to monitor EtCO2. Plan: continue to attempt to wean the patient down from BiPAP to [facility] awhile monitoring for adverse reactions to the PCA.

## 2018-02-13 NOTE — Progress Notes (Signed)
SLP Cancellation Note  Patient Details Name: Russell Williamson MRN: [id number] DOB: Dec 29, 1955   Cancelled treatment:       Reason Eval/Treat Not Completed: Medical issues which prohibited therapy. SLP following for dysphagia. Events of weekend noted. Pt was at a high risk for aspiration with all consistencies prior to intubation, change in medical status. This morning he is requiring BiPAP. Would recommend keeping NPO for now.   [name] 02/13/2018, 8:26 AM  [name], M.A. CCC-SLP [phone]

## 2018-02-13 NOTE — Progress Notes (Addendum)
Spoke with discharge liaison for [place], stated they brought patient the following DME to his room on Friday: tub seat and rolling walker.  Discussed DME with patient and he verified it was brought to his room on [place] prior to being transferred to 3M06.  NCM asked staff RN about DME and he was unable to confirm or deny.  4:00 pm (male) Nurse tech from [place] brought patient's Rolling walker and tub seat to patient room.

## 2018-02-13 NOTE — Progress Notes (Signed)
Russell Williamson is a 62 y.o. male patient transferred from ICU awake, alert - oriented  X 4 - no acute distress noted.  VSS - Blood pressure 128/82, pulse 72, temperature 98.5 F (36.9 C), temperature source Oral, resp. rate 14, height 5\' 9"  (1.753 m), weight 50.8 kg (111 lb 15.9 oz), SpO2 94 %.    IV in place, occlusive dsg intact without redness.  Orientation to room, and floor completed with information packet given to patient/family.  Patient declined safety video at this time.  Admission INP armband ID verified with patient/family, and in place.   SR up x 2, fall assessment complete, with patient and family able to verbalize understanding of risk associated with falls, and verbalized understanding to call nsg before up out of bed.  Call light within reach, patient able to voice, and demonstrate understanding.  Skin, clean-dry- intact without evidence of bruising, or skin tears.   No evidence of skin break down noted on exam.     Will cont to eval and treat per MD orders.  Richardean Chimera, RN 02/13/2018 4:39 PM

## 2018-02-13 NOTE — Progress Notes (Signed)
[facility] Progress Note Patient Name: Russell Williamson DOB: 07/26/1956 MRN: [id number]   Date of Service  02/13/2018  HPI/Events of Note  Hypoglycemia - Blood glucose = 83 --> 79 --> 70.  eICU Interventions  Will order: 1. Change 0.9 NaCl IV infusion at 75 mL/hour to D5 0.9 NaCl at 75 mL/hour.      Intervention Category Major Interventions: Other:  [name] Cornelia Copa 02/13/2018, 4:11 AM

## 2018-02-13 NOTE — Progress Notes (Signed)
02/13/18  0748 hrs: This RN is holding the patient's scheduled IV KCl replacement meds for now. The KCl was ordered this AM based on lab results from yesterday. The patient received KCl replacement yesterday and the serum K+ level was never rechecked. So, until the patient's AM labs from this morning result, we will be holding off on KCl infusions.

## 2018-02-13 NOTE — Progress Notes (Signed)
02/13/18  1315 hrs: PCA pump is cleared at this time. 250 mcg of Fentanyl has been given across 23 given doses and 29 demanded doses.

## 2018-02-13 NOTE — Progress Notes (Signed)
Entered patient room and he asked to get on bedpan. Once placed patient began complaining of difficulty breathing. Unable to get O2 Saturation to read. Patient placed on Venti Mask. RT called. Patient had sat probe placed on forehead with Saturation in 50's. Patient placed on nonrebreather. Lungs sound "like washing machine." Patient NTS'd by respiratory with some improvement. Elink called and per discussion with Dr. Oletta Darter, patient placed on Bipap. Saturation improved to 93. CCM NP at bedside.   Milford Cage, RN

## 2018-02-13 NOTE — Progress Notes (Signed)
PT Cancellation Note  Patient Details Name: Russell Williamson MRN: [id number] DOB: May 14, 1956   Cancelled Treatment:    Reason Eval/Treat Not Completed: (P) Medical issues which prohibited therapy Pt has had a change in medical status over the weekend and is currently on BiPAP. Nursing requesting to check back later as to appropriateness. PT will follow back this afternoon as able.   Tamala Manzer B. Migdalia Dk PT, DPT Acute Rehabilitation  [phone] Pager [phone]    [place] 02/13/2018, 9:40 AM

## 2018-02-13 NOTE — Progress Notes (Addendum)
02/13/18  0810 hrs: As of this time, the patient has no access and the IV team was unsuccessful in attempts to gain access. Page placed to Dr. Chase Caller (this is the MD listed as Attending in [place])  to inform the medical team, awaiting response.   0816 hrs: Phlebotomy unable to obtain AM labs. Page sent to Kennieth Rad, NP; awaiting response.  0818 hrs: Ms. Moshe Cipro returns call, coming to bedside to assess.  0840 hrs: REJ PIV placed by NP. AM labs obtained via line.

## 2018-02-13 NOTE — Progress Notes (Signed)
[facility] Progress Note Patient Name: Russell Williamson DOB: 1956/02/16 MRN: [id number]   Date of Service  02/13/2018  HPI/Events of Note  Respiratory Distress - Sat decreased into low 60's on 100% NRBM. CXR with R base atelectasis.   eICU Interventions  Will order: 1. NT Suction PRN.  2. Trial of BiPAP. 3. ABG at 7:15 AM. 4. Xopenex neb treatment STAT.  5. Ground team now at bedside. Further management per ground team.      Intervention Category Major Interventions: Hypoxemia - evaluation and management  [name] 02/13/2018, 6:57 AM

## 2018-02-14 ENCOUNTER — Inpatient Hospital Stay (HOSPITAL_COMMUNITY): Payer: Medicaid Other

## 2018-02-14 DIAGNOSIS — J9601 Acute respiratory failure with hypoxia: Secondary | ICD-10-CM | POA: Diagnosis present

## 2018-02-14 DIAGNOSIS — R0603 Acute respiratory distress: Secondary | ICD-10-CM

## 2018-02-14 LAB — CBC
HCT: 29.8 % — ABNORMAL LOW (ref 39.0–52.0)
Hemoglobin: 10.1 g/dL — ABNORMAL LOW (ref 13.0–17.0)
MCH: 32.9 pg (ref 26.0–34.0)
MCHC: 33.9 g/dL (ref 30.0–36.0)
MCV: 97.1 fL (ref 78.0–100.0)
PLATELETS: 319 10*3/uL (ref 150–400)
RBC: 3.07 MIL/uL — AB (ref 4.22–5.81)
RDW: 17.9 % — AB (ref 11.5–15.5)
WBC: 5.5 10*3/uL (ref 4.0–10.5)

## 2018-02-14 LAB — GLUCOSE, CAPILLARY
GLUCOSE-CAPILLARY: 100 mg/dL — AB (ref 65–99)
GLUCOSE-CAPILLARY: 118 mg/dL — AB (ref 65–99)
GLUCOSE-CAPILLARY: 74 mg/dL (ref 65–99)
GLUCOSE-CAPILLARY: 76 mg/dL (ref 65–99)
Glucose-Capillary: 76 mg/dL (ref 65–99)
Glucose-Capillary: 71 mg/dL (ref 65–99)
Glucose-Capillary: 67 mg/dL (ref 65–99)
Glucose-Capillary: 84 mg/dL (ref 65–99)

## 2018-02-14 LAB — BLOOD GAS, ARTERIAL
Acid-base deficit: 0.6 mmol/L (ref 0.0–2.0)
Bicarbonate: 25.2 mmol/L (ref 20.0–28.0)
Delivery systems: POSITIVE
Drawn by: 41977
Expiratory PAP: 5
FIO2: 100
Inspiratory PAP: 16
O2 Saturation: 95.6 %
Patient temperature: 98.6
pCO2 arterial: 54.6 mmHg — ABNORMAL HIGH (ref 32.0–48.0)
pH, Arterial: 7.286 — ABNORMAL LOW (ref 7.350–7.450)
pO2, Arterial: 90.4 mmHg (ref 83.0–108.0)

## 2018-02-14 LAB — RENAL FUNCTION PANEL
Albumin: 2.1 g/dL — ABNORMAL LOW (ref 3.5–5.0)
Anion gap: 10 (ref 5–15)
BUN: 6 mg/dL (ref 6–20)
CALCIUM: 7.9 mg/dL — AB (ref 8.9–10.3)
CO2: 25 mmol/L (ref 22–32)
CREATININE: 0.47 mg/dL — AB (ref 0.61–1.24)
Chloride: 103 mmol/L (ref 101–111)
Glucose, Bld: 72 mg/dL (ref 65–99)
Phosphorus: 2.4 mg/dL — ABNORMAL LOW (ref 2.5–4.6)
Potassium: 3.6 mmol/L (ref 3.5–5.1)
SODIUM: 138 mmol/L (ref 135–145)

## 2018-02-14 LAB — HEPATITIS PANEL, ACUTE
HEP B C IGM: NEGATIVE
HEP B S AG: NEGATIVE
Hep A IgM: NEGATIVE

## 2018-02-14 LAB — BRAIN NATRIURETIC PEPTIDE: B Natriuretic Peptide: 495.7 pg/mL — ABNORMAL HIGH (ref 0.0–100.0)

## 2018-02-14 LAB — VANCOMYCIN, TROUGH: Vancomycin Tr: 6 ug/mL — ABNORMAL LOW (ref 15–20)

## 2018-02-14 MED ORDER — FENTANYL CITRATE (PF) 100 MCG/2ML IJ SOLN
25.0000 ug | INTRAMUSCULAR | Status: DC | PRN
  Administered 2018-02-14 – 2018-02-15 (×3): 25 ug via INTRAVENOUS
  Filled 2018-02-14 (×3): qty 2

## 2018-02-14 MED ORDER — FUROSEMIDE 10 MG/ML IJ SOLN
20.0000 mg | Freq: Every day | INTRAMUSCULAR | Status: DC
  Administered 2018-02-14 – 2018-02-15 (×2): 20 mg via INTRAVENOUS
  Filled 2018-02-14 (×2): qty 2

## 2018-02-14 MED ORDER — FUROSEMIDE 10 MG/ML IJ SOLN
60.0000 mg | Freq: Once | INTRAMUSCULAR | Status: AC
  Administered 2018-02-14: 60 mg via INTRAVENOUS
  Filled 2018-02-14: qty 6

## 2018-02-14 MED ORDER — VANCOMYCIN HCL 500 MG IV SOLR
500.0000 mg | Freq: Three times a day (TID) | INTRAVENOUS | Status: DC
  Administered 2018-02-15 – 2018-02-16 (×5): 500 mg via INTRAVENOUS
  Filled 2018-02-14 (×6): qty 500

## 2018-02-14 MED ORDER — DEXTROSE 50 % IV SOLN
INTRAVENOUS | Status: AC
  Administered 2018-02-14: 25 mL
  Filled 2018-02-14: qty 50

## 2018-02-14 MED ORDER — ENOXAPARIN SODIUM 40 MG/0.4ML ~~LOC~~ SOLN
40.0000 mg | Freq: Every day | SUBCUTANEOUS | Status: DC
  Administered 2018-02-14 – 2018-02-18 (×5): 40 mg via SUBCUTANEOUS
  Filled 2018-02-14 (×5): qty 0.4

## 2018-02-14 NOTE — Progress Notes (Signed)
SLP Cancellation Note  Patient Details Name: Russell Williamson MRN: [id number] DOB: 09/11/1956   Cancelled treatment:       Reason Eval/Treat Not Completed: Medical issues which prohibited therapy. Pt was on schedule for MBS this morning but he had another rapid response event and is on BiPAP. Discussed with RN - will postpone study given current respiratory status. Would maintain NPO status now and until MBS can completed. Will f/u as able.   [name] 02/14/2018, 8:35 AM  [name], M.A. CCC-SLP [phone]

## 2018-02-14 NOTE — Progress Notes (Signed)
Pharmacy Antibiotic Note  Russell Williamson is a 62 y.o. male admitted on 02/06/2018 with pneumonia.  On D#4 Vancomycin/Zosyn dosing for possible aspiration PNA as well as known MRSA positive in sputum culture. Pt has been afebrile, wbc 5.5K today. Renal function is stable, Scr  down to 0.47.   Vancomycin trough is 6 subtherapeutic on 500 mg Q 12 hrs, drawn 11.5 hrs after last dose.  Plan: Increase vancomycin to 500 mg IV Q8H Continue zosyn 3.375gm IV Q8H (4 hr inf) F/u renal fxn, clinical status and recheck trough after 3-5 doses   Height: 5\' 9"  (175.3 cm) Weight: 117 lb 11.6 oz (53.4 kg) IBW/kg (Calculated) : 70.7  Temp (24hrs), Avg:98.4 F (36.9 C), Min:98 F (36.7 C), Max:98.6 F (37 C)  Recent Labs  Lab 02/11/18 0044 02/11/18 0727 02/11/18 1812 02/12/18 0413 02/13/18 0803 02/14/18 0353 02/14/18 1725  WBC 6.1 7.5  --  6.5 9.7 5.5  --   CREATININE 0.69 0.62 0.61 0.47* 0.52* 0.47*  --   LATICACIDVEN 3.4* 5.2* 3.8* 1.5  --   --   --   VANCOTROUGH  --   --   --   --   --   --  6*    Estimated Creatinine Clearance: 73.2 mL/min (A) (by C-G formula based on SCr of 0.47 mg/dL (L)).    No Known Allergies   Maryanna Shape, PharmD, BCPS  Clinical Pharmacist  Pager: [phone]   02/14/2018 6:58 PM

## 2018-02-14 NOTE — Progress Notes (Signed)
CCMD notified RN of pt having wide QRS multifocal tach. Pt was up with PT at that time. Pt is up in chair resting at this time. Notified Rai, MD. Will continue to monitor pt.

## 2018-02-14 NOTE — Progress Notes (Signed)
Patient referred by nurse.  Visited with patient. He exprtessed how his grandfather always wanted him to profess faith in [place] and how he wanted him to be saved but he never did it.  Now he understands and wants to do that.  Because that was his request we talked about it and had prayer and got him a Bible and will go back to see him again Wed if possible. Conard Novak, Chaplain   02/14/18 1200  Clinical Encounter Type  Visited With Patient  Visit Type Initial;Spiritual support;Other (Comment) (patient concerned about spiritual condition)  Referral From Nurse  Consult/Referral To [place] text;Prayer;Other (Comment) (wanted to make profession of faith and get Bible)  Stress Factors  Patient Stress Factors Health changes;Major life changes;Other (Comment) (concerned about professing faith)

## 2018-02-14 NOTE — Progress Notes (Addendum)
Physical Therapy Treatment Patient Details Name: Russell Williamson MRN: [id number] DOB: 1956-01-11 Today's Date: 02/14/2018    History of Present Illness Pt is a 62 y.o. male admitted 02/06/18 post-fall with fever and cough; CXR showed pneumonia. Worked up for CAP. Head CT shows no acute intracranial abnormality. PMH includes throat CA (s/p radiation 2012), OA, cervical adenopathy, chronic LBP, chronic pancreatitis, COPD, HTN, h/o homelessness. On the morning of 5/14, patient had a rapid response with respiratory distress, failed with NRB mask at 15 L, was placed on BiPAP, BiPapa    PT Comments    Patient supine in bed upon arrival with nasal canula out of nose and SpO2 >90%. Pt agreeable to participate in therapy. Pt with SpO2 desat to 84% on RA sitting EOB and 2L O2 via [place] donned with SpO2 up to >90%. Pt was able to ambulate 39ft with min A and RW (+2 for equipment and safety) before requiring seated break due to fatigue and c/o dizziness/lightheadeness. Given pt's current mobility level recommending SNF for further skilled PT services to maximize independence and safety with mobility.    Follow Up Recommendations  SNF     Equipment Recommendations  Other (comment)(RW already delivered to room; any other equipment TBD)    Recommendations for Other Services       Precautions / Restrictions Precautions Precautions: Fall Precaution Comments: Watch SpO2 Restrictions Weight Bearing Restrictions: No    Mobility  Bed Mobility Overal bed mobility: Needs Assistance Bed Mobility: Supine to Sit     Supine to sit: Min assist     General bed mobility comments: assist to elevate trunk into sitting  Transfers Overall transfer level: Needs assistance Equipment used: Rolling walker (2 wheeled) Transfers: Sit to/from Stand Sit to Stand: Min assist         General transfer comment: pt required increased time to sit EOB prior to standing due to c/o dizziness/lightheadedness; assist to  steady; cues for safe hand placement however pt prefers to pull on RW   Ambulation/Gait Ambulation/Gait assistance: Min assist +2 for safety/equipment Ambulation Distance (Feet): 8 Feet Assistive device: Rolling walker (2 wheeled) Gait Pattern/deviations: Step-through pattern;Decreased stride length;Trunk flexed Gait velocity: Decreased   General Gait Details: increased time standing prior to ambulating; min A for safety and managing RW; pt with c/o dizziness and required seated rest break   Stairs             Wheelchair Mobility    Modified Rankin (Stroke Patients Only)       Balance Overall balance assessment: Needs assistance   Sitting balance-Leahy Scale: Fair       Standing balance-Leahy Scale: Poor Standing balance comment: requires UE support                            Cognition Arousal/Alertness: Awake/alert Behavior During Therapy: Flat affect Overall Cognitive Status: Within Functional Limits for tasks assessed                                        Exercises      General Comments General comments (skin integrity, edema, etc.): pt with nasal canula off upon arrival and SpO2 96%; SpO2 desat with mobility on RA to 84% and SpO2 remained >90% on 2L O2 via [place]       Pertinent Vitals/Pain Pain Assessment: No/denies pain    Home  Living                      Prior Function            PT Goals (current goals can now be found in the care plan section) Acute Rehab PT Goals Patient Stated Goal: Return home Progress towards PT goals: Not progressing toward goals - comment(decreased activity tolerance)    Frequency    Min 3X/week      PT Plan Discharge plan needs to be updated    Co-evaluation   Reason for Co-Treatment: For patient/therapist safety PT goals addressed during session: mobility/safety with mobility  OT goals addressed during session: ADL's and self-care      AM-PAC PT "6 Clicks" Daily  Activity  Outcome Measure  Difficulty turning over in bed (including adjusting bedclothes, sheets and blankets)?: Unable Difficulty moving from lying on back to sitting on the side of the bed? : Unable Difficulty sitting down on and standing up from a chair with arms (e.g., wheelchair, bedside commode, etc,.)?: Unable Help needed moving to and from a bed to chair (including a wheelchair)?: A Little Help needed walking in hospital room?: A Little Help needed climbing 3-5 steps with a railing? : Total 6 Click Score: 10    End of Session Equipment Utilized During Treatment: Gait belt Activity Tolerance: Patient tolerated treatment well Patient left: in chair;with call bell/phone within reach;Other (comment)(OT present and assisting pt end of session) Nurse Communication: Mobility status PT Visit Diagnosis: Other abnormalities of gait and mobility (R26.89)     Time: 0867-6195 PT Time Calculation (min) (ACUTE ONLY): 33 min  Charges:  $Gait Training: 8-22 mins                    G Codes:       Earney Navy, PTA Pager: [phone]     Darliss Cheney 02/14/2018, 4:58 PM

## 2018-02-14 NOTE — Progress Notes (Signed)
Pt sating at 100% on continuous Bipap. Speech will revaluate once pt comes off of Bipap. Recommends keeping pt NPO. Will continue to monitor.

## 2018-02-14 NOTE — Progress Notes (Signed)
Pt came off of Bipap, Speech notified in attempt to do Modified barrium swallow, speech stated that they will be able to do on Wednesday 02/15/18. Pt is still NPO. Will continue to monitor pt.

## 2018-02-14 NOTE — Significant Event (Signed)
Rapid Response Event Note  Overview: Time Called: 0536 Arrival Time: 0540 Event Type: Respiratory  Initial Focused Assessment: 62 yo male lying in semi-fowlers position in the bed.  Erasmo Downer RN and Linus Orn RRT at bedside. Pt alert, looking around and able to communicate simple needs.  He stated he was having trouble breathing.  On NRB mask at 15 L, pt has labored respirations 25/min with accessory muscle use and saturations 76%.  Pt already has an existing order for bipap and will be placed on bipap.  Staff report similar event occurred at the same time yesterday morning.  HR 94 SR, BP 154/90 and afebrile.  Skin is pink, warm and dry.  Pt has equal strength bilaterally but generally weak.  BBS coarse rhonchi with fine crackles in the bases. Crackles worse on Right than Left.  Narcan was not given because patient was alert and communicating needs.   Interventions: -Notify primary svc -Reinitiate BIPAP -ABG -Low threshold for narcan if any dec LOC     Plan of Care (if not transferred): -Elevated HOB and aspiration precautions -Notify primary svc and/or RR for any clinical decompenstations -High risk for intubation  Event Summary: Name of Physician Notified: Bodenheimer, NP at 0545  Pt states he is getting his breath better.  HR 92, BP 139/108, RR 20 and sats 99%  On 100% FiO2 BIPAP 18/6.   Madelynn Done

## 2018-02-14 NOTE — Progress Notes (Signed)
Hypoglycemic Event  CBG: 67  Treatment: D50 IV 25 mL  Symptoms: None  Follow-up CBG: Time: @1340  CBG Result: 118  Possible Reasons for Event: Inadequate meal intake

## 2018-02-14 NOTE — Progress Notes (Signed)
While in patient room, patient's O2 saturations dropped to the 70's and remained within the 70's even after non-rebreather applied. Upon auscultation, crackles were heard in bilateral lower bases. Respiratory came to bedside and applied bipap. Rapid response notified and he ordered a STAT chest xray and came to bedside. RN notified Bodenheimer,NP and he placed 1 time order for 60 mg of lasix and a STAT ABG. Patient O2 saturation is now in the 90's. Patient on continuous bipap at this time. Shanon Brow, from Rapid Response advised not to give the patient any narcotics at this time. Bodenheimer,NP notified of patient's progression on bipap. Will continue to monitor and treat per MD orders.

## 2018-02-14 NOTE — Care Management Note (Signed)
Case Management Note  Patient Details  Name: Russell Williamson MRN: [id number] Date of Birth: April 01, 1956  Subjective/Objective:                    Action/Plan: Pt with some SOB last pm and needed Bipap. When medically stable pt already arranged with [facility] for [facility] services. [facility] will just need to be notified of d/c date. CM following.  Per prior CM note pt has DME in his room for home.   Expected Discharge Date:  02/10/18               Expected Discharge Plan:  [place]  In-House Referral:  Nutrition  Discharge planning Services  CM Consult  Post Acute Care Choice:  Durable Medical Equipment, Home Health Choice offered to:  Patient  DME Arranged:  Shower stool, Walker rolling DME Agency:  [place] Arranged:  RN, PT, Disease Management, OT, Nurse's Aide, Speech Therapy, Social Work [employer] Agency:  [place]  Status of Service:  Completed, signed off  If discussed at [employer], dates discussed:    Additional Comments:  Pollie Friar, RN 02/14/2018, 12:26 PM

## 2018-02-14 NOTE — Progress Notes (Signed)
RT called to assess patient due to desaturation. Placed on NRB with no improvement.  Patient complains of SOB.  Coarse crackles noted.  Rapid Response called. ABG obtained. Placed on BIPAP.

## 2018-02-14 NOTE — Progress Notes (Signed)
[facility]                                                                              Patient Demographics  Russell Williamson, is a 62 y.o. male, DOB - [birth year], TKW:[id number]  Admit date - 02/06/2018   Admitting Physician Toy Baker, MD  Outpatient Primary MD for the patient is Harvie Junior, MD  Outpatient specialists:   LOS - 8  days   Medical records reviewed and are as summarized below:    Chief Complaint  Patient presents with  . Fall       Brief summary   Patient is a 62 year old male withCOPD, GERD, Tonsillar Cancer s/p Radiation, Dysphagia, Polysubstance Abuse, ETOH, HTN scented to ED on 5/6 with fever and coughing.  Patient was found to have MRSA PNA. Plans were to be discharged on 5/10, however, per nursing staff patient left unit for a couple of hours and then came back stating that he had no ride and couldn't leave. Nurse reported giving patient medications at 2130 included Oxy 15 mg and Remeron). At midnight patient was found to be hypoxic and non-responsive. Given 0.4 mg Narcan without improvement. Intubated and transferred to ICU.  Patient was transferred to stepdown and care was transferred to tried hospitalist, assumed care on 5/14.  On the morning of 5/14, patient had a rapid response with respiratory distress, failed with NRB mask at 15 L, was placed on BiPAP, Lasix 60 mg IV x1 given.  Assessment & Plan    Principal Problem:   Acute respiratory failure with hypoxia (HCC) with COPD, MRSA pneumonia, aspiration event -On 5/10, patient was intubated for acute hypoxic respiratory failure, extubated on 5/12.  -Transferred to hospitalist service, assumed care on 5/14, had another rapid response this morning with respiratory distress - Failed NRB mask at 15 L, placed on BiPAP, ABG showed pH 7.28, PCO2 54.6, given Lasix 60 mg IV x1 -Currently on BiPAP, alert and awake, requesting for pain medications -Patient has been on chronic  narcotics outpatient, for now we will continue IV fentanyl, change to 25 MCG every 4 hours as needed for severe pain, assess the symptoms before giving fentanyl.  Patient has been on oxycodone 15 mg every 4 hours as needed at home, currently on oxycodone 5 mg every 8 hours as needed, no escalation of pain medications.  -High risk for intubation -Chest x-ray showed small to moderate right and small left pleural effusions, bibasilar opacities consistent with pneumonia.  Continue Lasix 20 mg IV daily, follow BNP  Active Problems:  Hypotension: Patient has a history of hypertension -Required pressor support on 5/11, currently resolved, 2D echo 9/18 showed EF of 65% no wall motion abnormalities  Acute encephalopathy with a history of alcohol, polysubstance abuse. - UDS on arrival positive for opiates, cocaine and benzodiazepines.  Patient was placed on Precedex drip, fentanyl drip in ICU, currently off. -Patient counseled strongly, once stable, social work consult for resources    Malnutrition of moderate degree ([place]) -Continue nutritional supplements    Anemia in chronic illness -H&H currently stable, 10.1  Nicotine abuse Placed on nicotine patch   Code Status:  Full code DVT Prophylaxis:  Lovenox  Family Communication: Discussed in detail with the patient, all imaging results, lab results explained to the patient.  No family member at the bedside  Disposition Plan:  Time Spent in minutes   35 minutes  Procedures:  Intubation extubation while in ICU  Consultants:   Pulmonology  Antimicrobials:    IV Zithromax IV Rocephin  Medications  Scheduled Meds: . chlorhexidine  15 mL Mouth Rinse BID  . chlorhexidine gluconate (MEDLINE KIT)  15 mL Mouth Rinse BID  . enoxaparin (LOVENOX) injection  40 mg Subcutaneous Daily  . folic acid  1 mg Per Tube Daily  . furosemide  20 mg Intravenous Daily  . ipratropium-albuterol  3 mL Nebulization TID  . lipase/protease/amylase  12,000 Units  Oral QID  . mouth rinse  15 mL Mouth Rinse BID  . pantoprazole sodium  40 mg Per Tube Daily  . thiamine  100 mg Per Tube Daily   Continuous Infusions: . sodium chloride 10 mL/hr at 02/12/18 0600  . cefTRIAXone (ROCEPHIN)  IV Stopped (02/13/18 1317)  . dextrose 5 % and 0.9% NaCl 30 mL/hr at 02/13/18 0738  . sodium chloride    . vancomycin Stopped (02/14/18 0715)   PRN Meds:.fentaNYL (SUBLIMAZE) injection, levalbuterol, ondansetron **OR** ondansetron (ZOFRAN) IV, oxyCODONE, polyethylene glycol, sodium chloride   Antibiotics   Anti-infectives (From admission, onward)   Start     Dose/Rate Route Frequency Ordered Stop   02/13/18 1200  cefTRIAXone (ROCEPHIN) 1 g in sodium chloride 0.9 % 100 mL IVPB     1 g 200 mL/hr over 30 Minutes Intravenous Every 24 hours 02/13/18 0932     02/12/18 1800  vancomycin (VANCOCIN) 500 mg in sodium chloride 0.9 % 100 mL IVPB     500 mg 100 mL/hr over 60 Minutes Intravenous Every 12 hours 02/12/18 1238     02/11/18 0245  vancomycin (VANCOCIN) IVPB 1000 mg/200 mL premix  Status:  Discontinued     1,000 mg 200 mL/hr over 60 Minutes Intravenous Every 24 hours 02/11/18 0241 02/12/18 1238   02/11/18 0245  piperacillin-tazobactam (ZOSYN) IVPB 3.375 g  Status:  Discontinued     3.375 g 12.5 mL/hr over 240 Minutes Intravenous Every 8 hours 02/11/18 0241 02/13/18 0911   02/10/18 2200  sulfamethoxazole-trimethoprim (BACTRIM DS,SEPTRA DS) 800-160 MG per tablet 1 tablet  Status:  Discontinued     1 tablet Oral Every 12 hours 02/10/18 1901 02/11/18 0201   02/10/18 0000  sulfamethoxazole-trimethoprim (BACTRIM DS,SEPTRA DS) 800-160 MG tablet     1 tablet Oral 2 times daily 02/10/18 1438 02/17/18 2359   02/07/18 1800  azithromycin (ZITHROMAX) 500 mg in sodium chloride 0.9 % 250 mL IVPB  Status:  Discontinued     500 mg 250 mL/hr over 60 Minutes Intravenous Every 24 hours 02/06/18 2310 02/07/18 0919   02/07/18 1800  cefTRIAXone (ROCEPHIN) 1 g in sodium chloride 0.9 % 100  mL IVPB  Status:  Discontinued     1 g 200 mL/hr over 30 Minutes Intravenous Every 24 hours 02/06/18 2310 02/10/18 1901   02/07/18 1800  azithromycin (ZITHROMAX) tablet 500 mg     500 mg Oral Daily 02/07/18 0919 02/10/18 1748   02/06/18 1800  cefTRIAXone (ROCEPHIN) 1 g in sodium chloride 0.9 % 100 mL IVPB     1 g 200 mL/hr over 30 Minutes Intravenous  Once 02/06/18 1758 02/06/18 2046   02/06/18 1800  azithromycin (ZITHROMAX) 500 mg in sodium chloride 0.9 % 250  mL IVPB     500 mg 250 mL/hr over 60 Minutes Intravenous  Once 02/06/18 1758 02/06/18 2226        Subjective:   Russell Williamson was seen and examined today.  Patient denies dizziness, chest pain,  abdominal pain, N/V/D/C.  Currently no fevers, on BiPAP, asking for pain meds  Objective:   Vitals:   02/14/18 0519 02/14/18 0601 02/14/18 0607 02/14/18 0830  BP: (!) 154/90 (!) 139/108    Pulse: 82 99 99   Resp:   20   Temp:      TempSrc:      SpO2: 95% 95% 95% 95%  Weight:      Height:        Intake/Output Summary (Last 24 hours) at 02/14/2018 1052 Last data filed at 02/14/2018 0717 Gross per 24 hour  Intake 1046.5 ml  Output 1560 ml  Net -513.5 ml     Wt Readings from Last 3 Encounters:  02/14/18 53.4 kg (117 lb 11.6 oz)  07/02/17 42.9 kg (94 lb 9.6 oz)  04/13/17 43.1 kg (95 lb)     Exam  General: Alert and oriented, on BiPAP  Eyes:   HEENT:  Atraumatic, normocephalic  Cardiovascular: S1 S2 auscultated, Regular rate and rhythm.  Respiratory: Mild scattered wheezing anteriorly  Gastrointestinal: Soft, nontender, nondistended, + bowel sounds  Ext: no pedal edema bilaterally  Neuro: no FND  Musculoskeletal: No digital cyanosis, clubbing  Skin: No rashes  Psych: Normal affect and demeanor, alert and oriented x3    Data Reviewed:  I have personally reviewed following labs and imaging studies  Micro Results Recent Results (from the past 240 hour(s))  Blood Culture (routine x 2)     Status:  None   Collection Time: 02/06/18  6:02 PM  Result Value Ref Range Status   Specimen Description BLOOD RIGHT ANTECUBITAL  Final   Special Requests   Final    BOTTLES DRAWN AEROBIC AND ANAEROBIC Blood Culture adequate volume   Culture   Final    NO GROWTH 5 DAYS Performed at [facility], [address] [place], [place] [address]    Report Status 02/11/2018 FINAL  Final  Blood Culture (routine x 2)     Status: None   Collection Time: 02/06/18  6:30 PM  Result Value Ref Range Status   Specimen Description BLOOD LEFT ANTECUBITAL  Final   Special Requests   Final    BOTTLES DRAWN AEROBIC AND ANAEROBIC Blood Culture adequate volume   Culture   Final    NO GROWTH 5 DAYS Performed at [facility], [place] [address], [place], [place] [address]    Report Status 02/11/2018 FINAL  Final  MRSA PCR Screening     Status: Abnormal   Collection Time: 02/07/18  2:26 AM  Result Value Ref Range Status   MRSA by PCR POSITIVE (A) NEGATIVE Final    Comment:        The GeneXpert MRSA Assay (FDA approved for NASAL specimens only), is one component of a comprehensive MRSA colonization surveillance program. It is not intended to diagnose MRSA infection nor to guide or monitor treatment for MRSA infections. RESULT CALLED TO, READ BACK BY AND VERIFIED WITH: Alyssa Grove RN 02/07/18 0631 JDW Performed at [facility], [address], [place], [place] [address]   Culture, sputum-assessment     Status: None   Collection Time: 02/07/18  5:42 PM  Result Value Ref Range Status   Specimen  Description EXPECTORATED SPUTUM  Final   Special Requests Normal  Final   Sputum evaluation   Final    THIS SPECIMEN IS ACCEPTABLE FOR SPUTUM CULTURE Performed at [facility], [place] [address] [place], [place] [address]    Report Status 02/08/2018 FINAL  Final  Culture, respiratory (NON-Expectorated)     Status: None   Collection Time: 02/07/18  5:42 PM  Result Value Ref Range Status   Specimen  Description EXPECTORATED SPUTUM  Final   Special Requests Normal Reflexed from [id number]  Final   Gram Stain   Final    ABUNDANT WBC PRESENT,BOTH PMN AND MONONUCLEAR MODERATE GRAM POSITIVE COCCI RARE YEAST FEW SQUAMOUS EPITHELIAL CELLS PRESENT Performed at [facility], [place] [address], [place], [place] [address]    Culture   Final    MODERATE METHICILLIN RESISTANT STAPHYLOCOCCUS AUREUS   Report Status 02/10/2018 FINAL  Final   Organism ID, Bacteria METHICILLIN RESISTANT STAPHYLOCOCCUS AUREUS  Final      Susceptibility   Methicillin resistant staphylococcus aureus - MIC*    CIPROFLOXACIN >=8 RESISTANT Resistant     ERYTHROMYCIN >=8 RESISTANT Resistant     GENTAMICIN <=0.5 SENSITIVE Sensitive     OXACILLIN >=4 RESISTANT Resistant     TETRACYCLINE <=1 SENSITIVE Sensitive     VANCOMYCIN <=0.5 SENSITIVE Sensitive     TRIMETH/SULFA <=10 SENSITIVE Sensitive     CLINDAMYCIN <=0.25 SENSITIVE Sensitive     RIFAMPIN <=0.5 SENSITIVE Sensitive     Inducible Clindamycin NEGATIVE Sensitive     * MODERATE METHICILLIN RESISTANT STAPHYLOCOCCUS AUREUS    Radiology Reports Dg Chest 2 View  Result Date: 02/06/2018 CLINICAL DATA:  Hypoxia, history of hypertension, COPD is seizures. Tobacco use since age 89. EXAM: CHEST - 2 VIEW COMPARISON:  07/01/2017 FINDINGS: Emphysematous hyperinflation of the lungs with new right small to moderate pleural effusion. Reverse cephalization and engorgement of pulmonary vasculature to the lower lobes likely on the basis of COPD. Slightly more confluent airspace opacity at the right lung base cannot exclude the possibility of pneumonia. Heart and mediastinal contours are within normal limits. Remote right-sided sixth rib fracture. IMPRESSION: COPD with new small to moderate sized right pleural effusion with reversed cephalization of pulmonary vasculature to the lower lobes likely on the basis of upper lobe COPD. More confluent opacities at the right lung base raise concern  for right lower lobe pneumonia and/or atelectasis. Electronically Signed   By: Ashley Royalty M.D.   On: 02/06/2018 17:53   Ct Head Wo Contrast  Result Date: 02/06/2018 CLINICAL DATA:  [place] this afternoon striking head, loss of consciousness, history of tonsillar cancer, COPD, GERD, hypertension, smoker EXAM: CT HEAD WITHOUT CONTRAST CT CERVICAL SPINE WITHOUT CONTRAST TECHNIQUE: Multidetector CT imaging of the head and cervical spine was performed following the standard protocol without intravenous contrast. Multiplanar CT image reconstructions of the cervical spine were also generated. COMPARISON:  CT soft tissue neck 04/23/2016 FINDINGS: CT HEAD FINDINGS Brain: Normal ventricular morphology. No midline shift or mass effect. Normal appearance of brain parenchyma. No intracranial hemorrhage, mass lesion, or evidence of acute infarction. No extra-axial fluid collections. Vascular: Mild atherosclerotic calcification of internal carotid arteries bilaterally at skull base Skull: Calvaria intact Sinuses/Orbits: Mucosal thickening LEFT maxillary sinus. Remaining visualized paranasal sinuses clear Other: N/A CT CERVICAL SPINE FINDINGS Alignment: Normal Skull base and vertebrae: Osseous mineralization normal. Visualized skull base intact. Vertebral body and disc space heights maintained. No fracture, subluxation or bone destruction. Soft tissues and spinal canal:  Prevertebral soft tissues are mildly prominent with fluid versus mucous in the hypopharynx. Scattered atherosclerotic calcifications of the carotid and vertebral arteries. Diffuse infiltration of soft tissue planes in the parapharyngeal region, which may be due to prior radiation therapy or tumor, suboptimally assessed by noncontrast exam, appears more pronounced than on priors contrast-enhanced CT soft tissue neck study. Narrowing of airway at the larynx and supraglottic region. Suboptimal assessment for adenopathy due to loss of tissue planes. Disc levels:  Broad-based disc herniation at C6-C7 indenting thecal sac. Upper chest: Lung apices appear emphysematous but clear. Other: N/A IMPRESSION: No acute intracranial abnormalities.1 No acute cervical spine abnormalities. Emphysematous changes at lung apices. Fluid/mucous within the hypopharynx with narrowing of the airway at the level of the larynx and supraglottic region. Diffuse infiltration of soft tissue planes in the cervical region which may be related to prior radiation therapy for tonsillar cancer though recurrent tumor could cause a similar appearance; recommend follow-up dedicated CT soft tissue neck imaging with contrast when clinical condition permits to further assess. Electronically Signed   By: Lavonia Dana M.D.   On: 02/06/2018 17:35   Ct Soft Tissue Neck W Contrast  Result Date: 02/09/2018 CLINICAL DATA:  62 y/o M; history of tonsillar cancer post radiation therapy. EXAM: CT NECK WITH CONTRAST TECHNIQUE: Multidetector CT imaging of the neck was performed using the standard protocol following the bolus administration of intravenous contrast. CONTRAST:  180m OMNIPAQUE IOHEXOL 300 MG/ML  SOLN COMPARISON:  04/23/2016 CT of the neck. 02/06/2018 CT of cervical spine. FINDINGS: Pharynx and larynx: No nodular mucosal enhancement of the oropharynx or tonsils to suggest recurrent disease. Mild smooth low-attenuation mucosal thickening of the oropharynx compatible chronic post radiation changes. No new exophytic mass or abnormal nodular enhancement of the aerodigestive tract. Salivary glands: Left submandibular gland resection. No inflammation, mass, or stone of residual salivary glands. Thyroid: Normal. Lymph nodes: Left neck dissection. No mass within the resection bed identified. No lymphadenopathy by imaging criteria, lymph node necrosis, or abnormal enhancement. Vascular: Diffuse irregularity of the carotid systems multiple areas of mild less than 50% stenosis compatible with atherosclerotic and post  radiation changes. 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 Limited intracranial: Negative. Visualized orbits: Negative. Mastoids and visualized paranasal sinuses: Moderate left maxillary sinus mucosal thickening, small fluid level, and chronic inflammatory changes of the wall of the sinus. Paranasal sinuses and mastoid air cells are otherwise normally aerated. Skeleton: Mild cervical spondylosis. No high-grade bony canal stenosis. No lytic or blastic lesion. Upper chest: Moderate emphysema.  Small right pleural effusion. Other: None. IMPRESSION: 1. Stable post treatment changes in the neck. No evidence of recurrent disease. No lymphadenopathy. 2. Stable chronic findings as above. Electronically Signed   By: LKristine GarbeM.D.   On: 02/09/2018 18:39   Ct Angio Chest Pe W Or Wo Contrast  Result Date: 02/07/2018 CLINICAL DATA:  62year old male with shortness of breath. History of pancreatitis and throat cancer. Weight loss. EXAM: CT ANGIOGRAPHY CHEST CT ABDOMEN AND PELVIS WITH CONTRAST TECHNIQUE: Multidetector CT imaging of the chest was performed using the standard protocol during bolus administration of intravenous contrast. Multiplanar CT image reconstructions and MIPs were obtained to evaluate the vascular anatomy. Multidetector CT imaging of the abdomen and pelvis was performed using the standard protocol during bolus administration of intravenous contrast. CONTRAST:  826mISOVUE-370 IOPAMIDOL (ISOVUE-370) INJECTION 76% COMPARISON:  CT of the abdomen pelvis dated 07/11/2016 and radiograph dated 03/10/2017 FINDINGS: Evaluation is limited due to streak artifact  caused by patient's arms and overlying metallic support device. Evaluation is also limited due to anasarca and cachexia. CTA CHEST FINDINGS Cardiovascular: There is mild cardiomegaly. No pericardial effusion. There is coronary vascular calcification primarily involving the LAD. The aorta is poorly visualized and  suboptimally opacified but grossly unremarkable. Evaluation of the pulmonary arteries is limited due to suboptimal enhancement of the peripheral branches. Apparent areas of decreased enhancement in the periphery of the distal branches of the right lower lobe (series 9 images 186-191) likely artifactual and related to volume averaging artifact with adjacent bronchi as well as poor contrast opacification. No definite CT evidence of pulmonary embolism. Mediastinum/Nodes: There is fullness of the right hilum measuring 15 mm in short axis. Ill-defined soft tissue density extending from the hila along the bronchi are not well evaluated but likely thickened bronchial wall with endobronchial debris. Evaluation of the mediastinum and hila is very limited. No mediastinal fluid collection. The esophagus is not well visualized. Lungs/Pleura: There are extensive consolidative changes of the lung bases and lower lobes progressed compared to the prior CT. There is diffuse areas of nodularity is at the lung bases. Diffuse thickening of the bronchial walls with mucous impaction involving the lower lobes. There is mucous impaction of the right lower lumbar bronchus. There is narrowing of the central bronchi likely secondary to edema. There is a small to moderate right and trace left pleural effusion. There is a background of emphysema. Minimal amount of air in the left apical pleural surface noted, appears chronic and seen on the CT of the cervical spine dated 02/06/2018. No pneumothorax. Musculoskeletal: Diffuse soft tissue edema and anasarca. There is loss of subcutaneous fat and cachexia. No acute osseous pathology. Extensive right chest wall venous collaterals possibly related to a degree of SVC occlusion. Review of the MIP images confirms the above findings. CT ABDOMEN and PELVIS FINDINGS No intra-abdominal free air or free fluid. Hepatobiliary: The liver is grossly unremarkable. No intrahepatic biliary ductal dilatation.  Cholecystectomy. Pancreas: Changes of chronic pancreatitis with dystrophic calcification and dilatation of the main pancreatic duct. Evaluation of the pancreas is limited due to suboptimal visualization. Spleen: Normal in size without focal abnormality. Adrenals/Urinary Tract: The adrenal glands are unremarkable. Small right renal cyst. The kidneys, kidneys and urinary bladder appear unremarkable. Stomach/Bowel: Oral contrast mixed with stool noted throughout the colon. There is no bowel dilatation or evidence of obstruction. The appendix is not identified with certainty. Vascular/Lymphatic: Extensive aortoiliac atherosclerotic disease. No portal venous gas. Evaluation for adenopathy is limited due to diffuse edema and anasarca as well as paucity of abdominal fat. Reproductive: The prostate and seminal vesicles are grossly unremarkable. Other: Diffuse edema and anasarca as well as cachexia. Musculoskeletal: No acute osseous pathology.  Osteopenia. Review of the MIP images confirms the above findings. IMPRESSION: 1. No definite CT evidence of pulmonary embolism. 2. Diffuse thickening of the bronchial wall with endobronchial mucous content predominantly involving the lower lobes. Large areas of airspace opacity involving the lower lobes and lung bases bilaterally, right greater left most consistent with pneumonia possibly related to aspiration. Clinical correlation is recommended. Bilateral pleural effusions, right greater left. 3. No definite acute intra-abdominal or pelvic pathology. 4. Diffuse soft tissue edema and anasarca.  Cachexia. Electronically Signed   By: Anner Crete M.D.   On: 02/07/2018 02:40   Ct Cervical Spine Wo Contrast  Result Date: 02/06/2018 CLINICAL DATA:  [place] this afternoon striking head, loss of consciousness, history of tonsillar cancer, COPD, GERD, hypertension, smoker EXAM:  CT HEAD WITHOUT CONTRAST CT CERVICAL SPINE WITHOUT CONTRAST TECHNIQUE: Multidetector CT imaging of the head and  cervical spine was performed following the standard protocol without intravenous contrast. Multiplanar CT image reconstructions of the cervical spine were also generated. COMPARISON:  CT soft tissue neck 04/23/2016 FINDINGS: CT HEAD FINDINGS Brain: Normal ventricular morphology. No midline shift or mass effect. Normal appearance of brain parenchyma. No intracranial hemorrhage, mass lesion, or evidence of acute infarction. No extra-axial fluid collections. Vascular: Mild atherosclerotic calcification of internal carotid arteries bilaterally at skull base Skull: Calvaria intact Sinuses/Orbits: Mucosal thickening LEFT maxillary sinus. Remaining visualized paranasal sinuses clear Other: N/A CT CERVICAL SPINE FINDINGS Alignment: Normal Skull base and vertebrae: Osseous mineralization normal. Visualized skull base intact. Vertebral body and disc space heights maintained. No fracture, subluxation or bone destruction. Soft tissues and spinal canal: Prevertebral soft tissues are mildly prominent with fluid versus mucous in the hypopharynx. Scattered atherosclerotic calcifications of the carotid and vertebral arteries. Diffuse infiltration of soft tissue planes in the parapharyngeal region, which may be due to prior radiation therapy or tumor, suboptimally assessed by noncontrast exam, appears more pronounced than on priors contrast-enhanced CT soft tissue neck study. Narrowing of airway at the larynx and supraglottic region. Suboptimal assessment for adenopathy due to loss of tissue planes. Disc levels: Broad-based disc herniation at C6-C7 indenting thecal sac. Upper chest: Lung apices appear emphysematous but clear. Other: N/A IMPRESSION: No acute intracranial abnormalities.1 No acute cervical spine abnormalities. Emphysematous changes at lung apices. Fluid/mucous within the hypopharynx with narrowing of the airway at the level of the larynx and supraglottic region. Diffuse infiltration of soft tissue planes in the cervical  region which may be related to prior radiation therapy for tonsillar cancer though recurrent tumor could cause a similar appearance; recommend follow-up dedicated CT soft tissue neck imaging with contrast when clinical condition permits to further assess. Electronically Signed   By: Lavonia Dana M.D.   On: 02/06/2018 17:35   Ct Abdomen Pelvis W Contrast  Result Date: 02/07/2018 CLINICAL DATA:  62 year old male with shortness of breath. History of pancreatitis and throat cancer. Weight loss. EXAM: CT ANGIOGRAPHY CHEST CT ABDOMEN AND PELVIS WITH CONTRAST TECHNIQUE: Multidetector CT imaging of the chest was performed using the standard protocol during bolus administration of intravenous contrast. Multiplanar CT image reconstructions and MIPs were obtained to evaluate the vascular anatomy. Multidetector CT imaging of the abdomen and pelvis was performed using the standard protocol during bolus administration of intravenous contrast. CONTRAST:  35m ISOVUE-370 IOPAMIDOL (ISOVUE-370) INJECTION 76% COMPARISON:  CT of the abdomen pelvis dated 07/11/2016 and radiograph dated 03/10/2017 FINDINGS: Evaluation is limited due to streak artifact caused by patient's arms and overlying metallic support device. Evaluation is also limited due to anasarca and cachexia. CTA CHEST FINDINGS Cardiovascular: There is mild cardiomegaly. No pericardial effusion. There is coronary vascular calcification primarily involving the LAD. The aorta is poorly visualized and suboptimally opacified but grossly unremarkable. Evaluation of the pulmonary arteries is limited due to suboptimal enhancement of the peripheral branches. Apparent areas of decreased enhancement in the periphery of the distal branches of the right lower lobe (series 9 images [id number]) likely artifactual and related to volume averaging artifact with adjacent bronchi as well as poor contrast opacification. No definite CT evidence of pulmonary embolism. Mediastinum/Nodes: There is  fullness of the right hilum measuring 15 mm in short axis. Ill-defined soft tissue density extending from the hila along the bronchi are not well evaluated but likely thickened bronchial wall with endobronchial  debris. Evaluation of the mediastinum and hila is very limited. No mediastinal fluid collection. The esophagus is not well visualized. Lungs/Pleura: There are extensive consolidative changes of the lung bases and lower lobes progressed compared to the prior CT. There is diffuse areas of nodularity is at the lung bases. Diffuse thickening of the bronchial walls with mucous impaction involving the lower lobes. There is mucous impaction of the right lower lumbar bronchus. There is narrowing of the central bronchi likely secondary to edema. There is a small to moderate right and trace left pleural effusion. There is a background of emphysema. Minimal amount of air in the left apical pleural surface noted, appears chronic and seen on the CT of the cervical spine dated 02/06/2018. No pneumothorax. Musculoskeletal: Diffuse soft tissue edema and anasarca. There is loss of subcutaneous fat and cachexia. No acute osseous pathology. Extensive right chest wall venous collaterals possibly related to a degree of SVC occlusion. Review of the MIP images confirms the above findings. CT ABDOMEN and PELVIS FINDINGS No intra-abdominal free air or free fluid. Hepatobiliary: The liver is grossly unremarkable. No intrahepatic biliary ductal dilatation. Cholecystectomy. Pancreas: Changes of chronic pancreatitis with dystrophic calcification and dilatation of the main pancreatic duct. Evaluation of the pancreas is limited due to suboptimal visualization. Spleen: Normal in size without focal abnormality. Adrenals/Urinary Tract: The adrenal glands are unremarkable. Small right renal cyst. The kidneys, kidneys and urinary bladder appear unremarkable. Stomach/Bowel: Oral contrast mixed with stool noted throughout the colon. There is no  bowel dilatation or evidence of obstruction. The appendix is not identified with certainty. Vascular/Lymphatic: Extensive aortoiliac atherosclerotic disease. No portal venous gas. Evaluation for adenopathy is limited due to diffuse edema and anasarca as well as paucity of abdominal fat. Reproductive: The prostate and seminal vesicles are grossly unremarkable. Other: Diffuse edema and anasarca as well as cachexia. Musculoskeletal: No acute osseous pathology.  Osteopenia. Review of the MIP images confirms the above findings. IMPRESSION: 1. No definite CT evidence of pulmonary embolism. 2. Diffuse thickening of the bronchial wall with endobronchial mucous content predominantly involving the lower lobes. Large areas of airspace opacity involving the lower lobes and lung bases bilaterally, right greater left most consistent with pneumonia possibly related to aspiration. Clinical correlation is recommended. Bilateral pleural effusions, right greater left. 3. No definite acute intra-abdominal or pelvic pathology. 4. Diffuse soft tissue edema and anasarca.  Cachexia. Electronically Signed   By: Anner Crete M.D.   On: 02/07/2018 02:40   Dg Chest Port 1 View  Result Date: 02/14/2018 CLINICAL DATA:  Acute onset of respiratory distress. EXAM: PORTABLE CHEST 1 VIEW COMPARISON:  Chest radiograph performed 02/13/2018, and CTA of the chest performed 02/07/2018 FINDINGS: The lungs are well-aerated. Small to moderate right and small left pleural effusions are again noted. Bibasilar airspace opacities are concerning for pneumonia given the appearance on prior CTA, perhaps slightly worsened from the prior study. No pneumothorax is seen. The cardiomediastinal silhouette is within normal limits. No acute osseous abnormalities are seen. IMPRESSION: Small to moderate right and small left pleural effusions again noted. Bibasilar airspace opacities are concerning for pneumonia given the appearance on prior CTA, perhaps slightly  worsened from the prior study. Electronically Signed   By: Garald Balding M.D.   On: 02/14/2018 06:23   Dg Chest Port 1 View  Result Date: 02/13/2018 CLINICAL DATA:  Respiratory failure EXAM: PORTABLE CHEST 1 VIEW COMPARISON:  02/12/2018 FINDINGS: Cardiac shadow is stable. Endotracheal tube and nasogastric catheter have been removed in the  interval. Some slight increase in right pleural effusion is noted although this may be positional in nature. Small left pleural effusion is again noted. Bibasilar infiltrates are seen stable from the prior exam. IMPRESSION: No significant interval change from the prior study. Electronically Signed   By: Inez Catalina M.D.   On: 02/13/2018 07:10   Dg Chest Port 1 View  Result Date: 02/12/2018 CLINICAL DATA:  Respiratory failure, history tonsillar cancer, COPD, GERD, smoker EXAM: PORTABLE CHEST 1 VIEW COMPARISON:  Portable exam 0704 hours compared to 02/11/2018 FINDINGS: Tip of endotracheal tube is at the level of the aortic arch, likely above carina though the carina is not adequately visualized. Nasogastric tube extends into stomach. Normal heart size, mediastinal contours, and pulmonary vascularity. Emphysematous changes with bibasilar infiltrates and pleural effusions. Upper lungs clear. No pneumothorax. IMPRESSION: Bibasilar pulmonary infiltrates, slightly improved. Persistent bibasilar pleural effusions. Underlying emphysematous changes. Electronically Signed   By: Lavonia Dana M.D.   On: 02/12/2018 08:15   Dg Chest Port 1 View  Result Date: 02/11/2018 CLINICAL DATA:  62 y/o  M; ET tube and OG tube. EXAM: PORTABLE CHEST 1 VIEW COMPARISON:  02/06/2018 chest radiograph.  02/07/2018 CT chest. FINDINGS: Stable cardiac silhouette given projection and technique. Small right larger than left pleural effusions and bibasilar consolidations. Endotracheal tube tip 4.6 cm above the carina. Enteric tube tip projects over gastric body. Right upper quadrant surgical clips,  presumably cholecystectomy. Bones are unremarkable. IMPRESSION: 1. Endotracheal tube tip 4.6 cm above the carina. Enteric tube tip projects over gastric body. 2. Stable small right larger than left pleural effusions and bibasilar consolidations of the lungs. Electronically Signed   By: Kristine Garbe M.D.   On: 02/11/2018 02:02   Dg Swallowing Func-speech Pathology  Result Date: 02/08/2018 Objective Swallowing Evaluation: Type of Study: MBS-Modified Barium Swallow Study  Patient Details Name: Baylor Cortez MRN: [id number] Date of Birth: 1955-11-12 Today's Date: 02/08/2018 Time: SLP Start Time (ACUTE ONLY): 1345 -SLP Stop Time (ACUTE ONLY): 1407 SLP Time Calculation (min) (ACUTE ONLY): 22 min Past Medical History: Past Medical History: Diagnosis Date . Anemia  . Arthritis  . Cervical adenopathy 04/26/2012 . Chronic back pain   lower . Chronic pancreatitis ([place])  . Constipation  . COPD (chronic obstructive pulmonary disease) ([place])  . Daily headache 05/23/2012  "small ones" . Deficiency anemia 04/15/2016 . Dyspnea   with exertion . G tube feedings ([facility])   hx of  feeding tube in stomach . GERD (gastroesophageal reflux disease)  . History of blood transfusion ~ 2004  "from the pancreatitis" . History of radiation therapy 02/02/2011-03/22/2011  head/neck,left tonsil . Hx of radiation therapy 02/02/11 to 03/22/11  L tonsil . Hypertension  . Insomnia  . 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 Pancytopenia ([place]) 02/28/2014 . Poor venous access 04/28/2016 . 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 Thrush 11/02/2013 . 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 Urinary hesitancy  Past Surgical History: Past Surgical History: Procedure Laterality Date . BILE DUCT STENT PLACEMENT    hx of . CHOLECYSTECTOMY  04/2005 . DIRECT LARYNGOSCOPY  05/23/2012  Procedure: DIRECT LARYNGOSCOPY;  Surgeon: Rozetta Nunnery, MD;  Location: [facility];  Service: ENT;  Laterality: N/A; . DIRECT LARYNGOSCOPY  N/A 03/23/2013  Procedure: DIRECT LARYNGOSCOPY;  Surgeon: Rozetta Nunnery, MD;  Location: [facility];  Service: ENT;  Laterality: N/A; . ESOPHAGEAL DILATION N/A 03/23/2013  Procedure: ESOPHAGEAL DILATION;  Surgeon: Rozetta Nunnery,  MD;  Location: [facility];  Service: ENT;  Laterality: N/A; . ESOPHAGOGASTRODUODENOSCOPY (EGD) WITH PROPOFOL N/A 03/01/2017  Procedure: ESOPHAGOGASTRODUODENOSCOPY (EGD) WITH PROPOFOL;  Surgeon: Ladene Artist, MD;  [facility];  Service: Endoscopy;  Laterality: N/A; . FLEXIBLE SIGMOIDOSCOPY N/A 03/01/2017  Procedure: FLEXIBLE SIGMOIDOSCOPY;  Surgeon: Ladene Artist, MD;  Location[facility];  Service: Endoscopy;  Laterality: N/A; . IR GENERIC HISTORICAL  04/29/2016  IR US GUIDE VASC ACCESS RIGHT 04/29/2016 [facility] . IR GENERIC HISTORICAL  04/29/2016  IR FLUORO GUIDE CV LINE RIGHT 04/29/2016 [facility] . IR GENERIC HISTORICAL  05/12/2016  IR US GUIDE VASC ACCESS LEFT 05/12/2016 [facility] . IR GENERIC HISTORICAL  05/12/2016  IR FLUORO GUIDE CV LINE LEFT 05/12/2016 [facility] . IR GENERIC HISTORICAL  06/28/2016  IR US GUIDE VASC ACCESS RIGHT 06/28/2016 Sandi Mariscal, MD [facility] . IR GENERIC HISTORICAL  06/28/2016  IR FLUORO GUIDE PORT INSERTION RIGHT 06/28/2016 Sandi Mariscal, MD [facility] . IR REMOVAL TUN ACCESS W/ PORT W/O FL MOD SED  02/10/2017 . MASS BIOPSY  05/23/2012  Procedure: NECK MASS BIOPSY;  Surgeon: Rozetta Nunnery, MD;  Location: [facility];  Service: ENT;  Laterality: N/A; . RADICAL NECK DISSECTION  05/23/2012  w/mass excision . RADICAL NECK DISSECTION  05/23/2012  Procedure: RADICAL NECK DISSECTION;  Surgeon: Rozetta Nunnery, MD;  Location: [facility];  Service: ENT;  Laterality: Left; . TIBIA FRACTURE SURGERY  1990's  left HPI: Pt is a 62 y.o. male admitted s/p fall (questionable LOC); CXR showed RLL PNA, CT Head negative for acute changes. 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   Subjective: describes having to puree all his food to get it to go down at home Assessment / Plan / Recommendation CHL IP CLINICAL IMPRESSIONS 02/08/2018 Clinical Impression Pt has a moderate pharyngeal dysphagia suspected to be chronic in nature secondary to history of radiation tx for throat cancer. He has minimal hyolaryngeal excursion, epiglottic deflection, base of tongue retraction, and pharyngeal squeeze, resulting in poor swallow efficiency and limited airway closure. Moderate residue remains at the entrance of the laryngeal vestibule and pyriform sinuses with thin and nectar thick liquids, which are silently penetrated/aspiration during the swallow. His cough is not effective at clearing aspirates, but if he performs a heard cough and re-swallow he can clear most penetrates with cup sips of nectar thick liquids and spoonfuls of thin liquids, as penetration is more shallow. Increasing amounts of residue remain in the valleculae as consistencies become thicker, with honey thick liquids still penetrating into the laryngeal vestibule. Pt is likely at a high risk for aspiration, malnutrition, and dehydration with any consistencies recommended. In light of his acute PNA would favor a more conservative diet of Dys 1 textures and nectar thick liquids by cup using hard swallows, hard coughs, and hard second swallows. Teaspoons of thin water could be trialed between meals after oral care using the same strategies. MD may also wish to consider conversation about overall GOC given his likely chronic dysphagia. SLP Visit Diagnosis Dysphagia, pharyngoesophageal phase (R13.14) Attention and concentration deficit following -- Frontal lobe and  executive function deficit following -- Impact on safety and function Severe aspiration risk;Moderate aspiration risk;Risk for inadequate nutrition/hydration  CHL IP TREATMENT RECOMMENDATION 02/08/2018 Treatment Recommendations Therapy as outlined in treatment plan below   Prognosis 02/08/2018 Prognosis for Safe Diet Advancement Guarded Barriers to Reach Goals Time post onset;Severity of deficits Barriers/Prognosis Comment -- CHL IP DIET RECOMMENDATION 02/08/2018 SLP Diet Recommendations Dysphagia 1 (Puree) solids;Nectar thick liquid Liquid Administration via Cup;No straw Medication Administration Crushed with puree Compensations Slow rate;Small sips/bites;Hard cough after swallow;Multiple dry swallows after each bite/sip Postural Changes Remain semi-upright after after feeds/meals (Comment);Seated upright at 90 degrees   CHL IP OTHER RECOMMENDATIONS 02/08/2018 Recommended Consults -- Oral Care Recommendations Oral care QID Other Recommendations Have oral suction available;Order thickener from pharmacy;Prohibited food (jello, ice cream, thin soups);Remove water pitcher   CHL IP FOLLOW UP RECOMMENDATIONS 02/08/2018 Follow up Recommendations (No Data)   CHL IP FREQUENCY AND DURATION 02/08/2018 Speech Therapy Frequency (ACUTE ONLY) min 2x/week Treatment Duration 2 weeks      CHL IP ORAL PHASE 02/08/2018 Oral Phase WFL Oral - Pudding Teaspoon -- Oral - Pudding Cup -- Oral - Honey Teaspoon -- Oral - Honey Cup -- Oral - Nectar Teaspoon -- Oral - Nectar Cup -- Oral - Nectar Straw -- Oral - Thin Teaspoon -- Oral - Thin Cup -- Oral - Thin Straw -- Oral - Puree -- Oral - Mech Soft -- Oral - Regular -- Oral - Multi-Consistency -- Oral - Pill -- Oral Phase - Comment --  CHL IP PHARYNGEAL PHASE 02/08/2018 Pharyngeal Phase Impaired Pharyngeal- Pudding Teaspoon -- Pharyngeal -- Pharyngeal- Pudding Cup -- Pharyngeal -- Pharyngeal- Honey Teaspoon -- Pharyngeal -- Pharyngeal- Honey Cup Delayed swallow initiation-vallecula;Reduced pharyngeal  peristalsis;Reduced epiglottic inversion;Reduced anterior laryngeal mobility;Reduced laryngeal elevation;Reduced airway/laryngeal closure;Reduced tongue base retraction;Pharyngeal residue - valleculae;Penetration/Aspiration during swallow Pharyngeal Material enters airway, remains ABOVE vocal cords and not ejected out Pharyngeal- Nectar Teaspoon -- Pharyngeal -- Pharyngeal- Nectar Cup Reduced pharyngeal peristalsis;Reduced epiglottic inversion;Reduced anterior laryngeal mobility;Reduced laryngeal elevation;Reduced airway/laryngeal closure;Reduced tongue base retraction;Pharyngeal residue - valleculae;Penetration/Aspiration during swallow;Penetration/Aspiration before swallow;Delayed swallow initiation-pyriform sinuses;Compensatory strategies attempted (with notebox) Pharyngeal Material enters airway, remains ABOVE vocal cords and not ejected out Pharyngeal- Nectar Straw -- Pharyngeal -- Pharyngeal- Thin Teaspoon Reduced pharyngeal peristalsis;Reduced epiglottic inversion;Reduced anterior laryngeal mobility;Reduced laryngeal elevation;Reduced airway/laryngeal closure;Reduced tongue base retraction;Pharyngeal residue - valleculae;Penetration/Aspiration during swallow;Delayed swallow initiation-vallecula;Compensatory strategies attempted (with notebox) Pharyngeal Material enters airway, remains ABOVE vocal cords and not ejected out Pharyngeal- Thin Cup Reduced pharyngeal peristalsis;Reduced epiglottic inversion;Reduced anterior laryngeal mobility;Reduced laryngeal elevation;Reduced airway/laryngeal closure;Reduced tongue base retraction;Pharyngeal residue - valleculae;Delayed swallow initiation-pyriform sinuses;Penetration/Aspiration before swallow;Penetration/Aspiration during swallow;Compensatory strategies attempted (with notebox) Pharyngeal Material enters airway, passes BELOW cords without attempt by patient to eject out (silent aspiration) Pharyngeal- Thin Straw -- Pharyngeal -- Pharyngeal- Puree Delayed swallow  initiation-vallecula;Reduced pharyngeal peristalsis;Reduced epiglottic inversion;Reduced anterior laryngeal mobility;Reduced laryngeal elevation;Reduced airway/laryngeal closure;Reduced tongue base retraction;Pharyngeal residue - valleculae Pharyngeal -- Pharyngeal- Mechanical Soft -- Pharyngeal -- Pharyngeal- Regular -- Pharyngeal -- Pharyngeal- Multi-consistency -- Pharyngeal -- Pharyngeal- Pill -- Pharyngeal -- Pharyngeal Comment --  CHL IP CERVICAL ESOPHAGEAL PHASE 02/08/2018 Cervical Esophageal Phase Impaired Pudding Teaspoon -- Pudding Cup -- Honey Teaspoon -- Honey Cup Reduced cricopharyngeal relaxation Nectar Teaspoon -- Nectar Cup Reduced cricopharyngeal relaxation Nectar Straw -- Thin Teaspoon Reduced cricopharyngeal relaxation Thin Cup Reduced cricopharyngeal relaxation Thin Straw -- Puree Reduced cricopharyngeal relaxation Mechanical Soft -- Regular -- Multi-consistency -- Pill -- Cervical Esophageal Comment -- No flowsheet data found. [name] 02/08/2018, 3:06 PM  [name], M.A. CCC-SLP [phone]              Lab Data:  CBC: Recent Labs  Lab 02/11/18 0044 02/11/18 0727 02/12/18 0413 02/13/18 0803 02/14/18 0353  WBC 6.1 7.5 6.5 9.7 5.5  HGB 11.5* 10.6* 8.8* 10.1* 10.1*  HCT 35.0* 31.9* 25.5* 30.8* 29.8*  MCV 97.8 95.8 92.1 96.9 97.1  PLT 266 222 226 275 449   Basic Metabolic Panel: Recent Labs  Lab 02/09/18 0925 02/10/18 0322 02/11/18 0044 02/11/18 0727 02/11/18 1812 02/12/18 0413 02/13/18 0803 02/14/18 0353  NA 135 135 131* 134* 135 134* 138 138  K 4.0 4.1 4.7 5.5* 3.7 2.9* 4.0 3.6  CL 96* 94* 90* 94* 98* 102 107 103  CO2 28 32 _0 GLUCOSE 116* 125* 238* 80 164* 125* 98 72  BUN _1 CREATININE 0.44* 0.42* 0.69 0.62 0.61 0.47* 0.52* 0.47*  CALCIUM 8.4* 8.4* 8.4* 8.0* 7.3* 7.1* 7.7* 7.9*  MG 1.2* 1.6* 1.6* 1.9  --   --  1.4*  --   PHOS  --   --  5.4* 2.7  --   --  3.0 2.4*   GFR: Estimated Creatinine Clearance: 73.2  mL/min (A) (by C-G formula based on SCr of 0.47 mg/dL (L)). Liver Function Tests: Recent Labs  Lab 02/09/18 0925 02/10/18 0322 02/11/18 0727 02/12/18 0413 02/14/18 0353  AST 121* 92* 85* 48*  --   ALT 86* 73* 55 47  --   ALKPHOS 59 60 52 48  --   BILITOT 0.5 0.5 1.5* 0.6  --   PROT 5.6* 5.5* 5.4* 4.9*  --   ALBUMIN 2.4* 2.3* 2.4* 1.9* 2.1*   No results for input(s): LIPASE, AMYLASE in the last 168 hours. No results for input(s): AMMONIA in the last 168 hours. Coagulation Profile: No results for input(s): INR, PROTIME in the last 168 hours. Cardiac Enzymes: Recent Labs  Lab 02/11/18 0044 02/11/18 0122 02/11/18 0727 02/11/18 1246 02/12/18 0413  CKTOTAL  --  521*  --   --  263  CKMB  --   --   --   --  3.3  TROPONINI 0.13*  --  0.07* 0.05*  --    BNP (last 3 results) No results for input(s): PROBNP in the last 8760 hours. HbA1C: No results for input(s): HGBA1C in the last 72 hours. CBG: Recent Labs  Lab 02/13/18 2043 02/13/18 2352 02/14/18 0405 02/14/18 0527 02/14/18 0817  GLUCAP 92 100* 74 76 71   Lipid Profile: No results for input(s): CHOL, HDL, LDLCALC, TRIG, CHOLHDL, LDLDIRECT in the last 72 hours. Thyroid Function Tests: No results for input(s): TSH, T4TOTAL, FREET4, T3FREE, THYROIDAB in the last 72 hours. Anemia Panel: No results for input(s): VITAMINB12, FOLATE, FERRITIN, TIBC, IRON, RETICCTPCT in the last 72 hours. Urine analysis:    Component Value Date/Time   COLORURINE AMBER (A) 02/06/2018 1616   APPEARANCEUR HAZY (A) 02/06/2018 1616   LABSPEC 1.018 02/06/2018 1616   PHURINE 5.0 02/06/2018 1616   GLUCOSEU NEGATIVE 02/06/2018 1616   HGBUR MODERATE (A) 02/06/2018 1616   BILIRUBINUR NEGATIVE 02/06/2018 1616   KETONESUR NEGATIVE 02/06/2018 1616   PROTEINUR 30 (A) 02/06/2018 1616   UROBILINOGEN 0.2 08/31/2014 2350   NITRITE NEGATIVE 02/06/2018 1616   LEUKOCYTESUR NEGATIVE 02/06/2018 1616     Anniebell Bedore M.D. [facility] 02/14/2018,  10:52 AM  Pager: [phone] Between 7am to 7pm - call Pager - [phone]  After 7pm go to [email or web address] - password [email or web address]  Call night coverage person covering after 7pm

## 2018-02-14 NOTE — Progress Notes (Signed)
RT removed patient from bipap and placed on 2lnc. Patient is tolerating well at this time. No complications. Vital signs stable. RN aware. RT will continue to monitor.

## 2018-02-14 NOTE — Progress Notes (Signed)
Occupational Therapy Treatment Patient Details Name: Russell Williamson MRN: [id number] DOB: 10/27/1955 Today's Date: 02/14/2018    History of present illness Pt is a 62 y.o. male admitted 02/06/18 post-fall with fever and cough; CXR showed pneumonia. Worked up for CAP. Head CT shows no acute intracranial abnormality. PMH includes throat CA (s/p radiation 2012), OA, cervical adenopathy, chronic LBP, chronic pancreatitis, COPD, HTN, h/o homelessness. On the morning of 5/14, patient had a rapid response with respiratory distress, failed with NRB mask at 15 L, was placed on BiPAP, BiPaP Discontinued 5/14. On 2L [place].   OT comments  Pt demonstrates a decline in function since last OT session, requiring min A with mobility @ RW level and mod A with LB ADL. Pt limited by fatigue and complaints of dizziness. Pt appears to desat to 85 on 2L during mobility and functional tasks although poor waveform. BP 144/80 EOB; 138/90 after walking 8 ft. Unable to mobilize further due to dizziness and desat.  Due to current level of functioning, feel pt would benefit form rehab at a SNF to facilitate return to PLOF. Pt in agreement to rehab. Will continue to follow acutely to facilitate safe DC to next venue of care.   Follow Up Recommendations  SNF;Supervision/Assistance - 24 hour    Equipment Recommendations  Tub/shower seat     Recommendations for Other Services      Precautions / Restrictions Precautions Precautions: Fall Precaution Comments: Watch SpO2 Restrictions Weight Bearing Restrictions: No       Mobility Bed Mobility     General bed mobility comments: EOB with PT  Transfers Overall transfer level: Needs assistance Equipment used: Rolling walker (2 wheeled) Transfers: Sit to/from Stand Sit to Stand: Min assist         General transfer comment: vc for hand placement/safety    Balance Overall balance assessment: Needs assistance   Sitting balance-Leahy Scale: Fair       Standing  balance-Leahy Scale: Poor Standing balance comment: requires UE support                           ADL either performed or assessed with clinical judgement   ADL Overall ADL's : Needs assistance/impaired Eating/Feeding: NPO   Grooming: Set up;Sitting;Supervision/safety       Lower Body Bathing: Moderate assistance;Sit to/from stand   Upper Body Dressing : Set up;Supervision/safety;Sitting   Lower Body Dressing: Moderate assistance;Sit to/from stand   Toilet Transfer: Minimal assistance;RW   Toileting- Clothing Manipulation and Hygiene: Minimal assistance Toileting - Clothing Manipulation Details (indicate cue type and reason): assisted with pericare/clothing management     Functional mobility during ADLs: Minimal assistance;Rolling walker;Cueing for safety General ADL Comments: PT complaining of dizziness. limited by fatigue     Vision       Perception     Praxis      Cognition Arousal/Alertness: Awake/alert Behavior During Therapy: WFL for tasks assessed/performed Overall Cognitive Status: No family/caregiver present to determine baseline cognitive functioning                                 General Comments: slow proceesing; will further assess        Exercises     Shoulder Instructions       General Comments     Pertinent Vitals/ Pain       Pain Assessment: No/denies pain  Home Living  Prior Functioning/Environment              Frequency  Min 2X/week        Progress Toward Goals  OT Goals(current goals can now be found in the care plan section)  Progress towards OT goals: Not progressing toward goals - comment(respiratory distress/medical issues)  Acute Rehab OT Goals Patient Stated Goal: to play pool OT Goal Formulation: With patient Time For Goal Achievement: 02/22/18 Potential to Achieve Goals: Good ADL Goals Pt Will Perform Grooming: with  supervision;standing Pt Will Perform Lower Body Bathing: with supervision;sit to/from stand Pt Will Perform Lower Body Dressing: with supervision;sit to/from stand Pt Will Transfer to Toilet: with supervision;ambulating Pt Will Perform Toileting - Clothing Manipulation and hygiene: with supervision;sit to/from stand Additional ADL Goal #1: Pt will be knowledgeable in energy conservation strategies during ADL and mobility including benefits of seated showering.  Plan Discharge plan needs to be updated;Frequency remains appropriate    Co-evaluation    PT/OT/SLP Co-Evaluation/Treatment: Yes(partial session) Reason for Co-Treatment: For patient/therapist safety PT goals addressed during session: Mobility/safety with mobility OT goals addressed during session: ADL's and self-care      AM-PAC PT "6 Clicks" Daily Activity     Outcome Measure   Help from another person eating meals?: None Help from another person taking care of personal grooming?: A Little Help from another person toileting, which includes using toliet, bedpan, or urinal?: A Little Help from another person bathing (including washing, rinsing, drying)?: A Little Help from another person to put on and taking off regular upper body clothing?: A Little Help from another person to put on and taking off regular lower body clothing?: A Little 6 Click Score: 19    End of Session Equipment Utilized During Treatment: Gait belt;Rolling walker;Oxygen(2L)  OT Visit Diagnosis: Other abnormalities of gait and mobility (R26.89);Muscle weakness (generalized) (M62.81)   Activity Tolerance Patient limited by fatigue   Patient Left in chair;with call bell/phone within reach   Nurse Communication Mobility status;Other (comment)(DC plan)        Time: 3545-6256 OT Time Calculation (min): 39 min  Charges: OT General Charges $OT Visit: 1 Visit OT Treatments $Self Care/Home Management : 23-37 mins  Maurie Boettcher, OT/L  OT Clinical  Specialist [phone]    [facility] 02/14/2018, 5:08 PM

## 2018-02-15 ENCOUNTER — Inpatient Hospital Stay (HOSPITAL_COMMUNITY): Payer: Medicaid Other

## 2018-02-15 LAB — GLUCOSE, CAPILLARY
GLUCOSE-CAPILLARY: 71 mg/dL (ref 65–99)
GLUCOSE-CAPILLARY: 77 mg/dL (ref 65–99)
Glucose-Capillary: 117 mg/dL — ABNORMAL HIGH (ref 65–99)
Glucose-Capillary: 138 mg/dL — ABNORMAL HIGH (ref 65–99)
Glucose-Capillary: 78 mg/dL (ref 65–99)
Glucose-Capillary: 94 mg/dL (ref 65–99)

## 2018-02-15 LAB — BASIC METABOLIC PANEL
ANION GAP: 10 (ref 5–15)
CHLORIDE: 101 mmol/L (ref 101–111)
CO2: 28 mmol/L (ref 22–32)
Calcium: 7.9 mg/dL — ABNORMAL LOW (ref 8.9–10.3)
Creatinine, Ser: 0.49 mg/dL — ABNORMAL LOW (ref 0.61–1.24)
GFR calc Af Amer: >60 mL/min (ref >60)
GFR calc non Af Amer: >60 mL/min (ref >60)
GLUCOSE: 72 mg/dL (ref 65–99)
POTASSIUM: 3.1 mmol/L — AB (ref 3.5–5.1)
Sodium: 139 mmol/L (ref 135–145)

## 2018-02-15 LAB — CBC
HEMATOCRIT: 30.8 % — AB (ref 39.0–52.0)
HEMOGLOBIN: 10 g/dL — AB (ref 13.0–17.0)
MCH: 31.5 pg (ref 26.0–34.0)
MCHC: 32.5 g/dL (ref 30.0–36.0)
MCV: 97.2 fL (ref 78.0–100.0)
Platelets: 326 10*3/uL (ref 150–400)
RBC: 3.17 MIL/uL — ABNORMAL LOW (ref 4.22–5.81)
RDW: 17.3 % — ABNORMAL HIGH (ref 11.5–15.5)
WBC: 3.4 10*3/uL — ABNORMAL LOW (ref 4.0–10.5)

## 2018-02-15 MED ORDER — FUROSEMIDE 10 MG/ML IJ SOLN
40.0000 mg | Freq: Two times a day (BID) | INTRAMUSCULAR | Status: DC
  Administered 2018-02-15 – 2018-02-16 (×3): 40 mg via INTRAVENOUS
  Filled 2018-02-15 (×3): qty 4

## 2018-02-15 MED ORDER — OXYCODONE HCL 5 MG PO TABS
5.0000 mg | ORAL_TABLET | Freq: Four times a day (QID) | ORAL | Status: DC | PRN
  Administered 2018-02-15 – 2018-02-18 (×11): 5 mg via ORAL
  Filled 2018-02-15 (×12): qty 1

## 2018-02-15 MED ORDER — POTASSIUM CHLORIDE CRYS ER 20 MEQ PO TBCR
40.0000 meq | EXTENDED_RELEASE_TABLET | ORAL | Status: AC
  Administered 2018-02-15 (×2): 40 meq via ORAL
  Filled 2018-02-15 (×2): qty 2

## 2018-02-15 MED ORDER — PANTOPRAZOLE SODIUM 40 MG PO PACK
40.0000 mg | PACK | Freq: Every day | ORAL | Status: DC
  Administered 2018-02-16 – 2018-02-18 (×3): 40 mg via ORAL
  Filled 2018-02-15 (×3): qty 20

## 2018-02-15 NOTE — Progress Notes (Signed)
[facility]                                                                              Patient Demographics  Zanden Colver, is a 62 y.o. male, DOB - May 09, 1956, PJS:[id number]  Admit date - 02/06/2018   Admitting Physician Toy Baker, MD  Outpatient Primary MD for the patient is Harvie Junior, MD LOS - 9  days   Chief Complaint  Patient presents with  . Fall       Brief summary   Patient is a 62 year old male withCOPD, GERD, Tonsillar Cancer s/p Radiation, Dysphagia, Polysubstance Abuse, ETOH, HTN scented to ED on 5/6 with fever and coughing.  Patient was found to have MRSA PNA. Plans were to be discharged on 5/10, however, per nursing staff patient left unit for a couple of hours and then came back stating that he had no ride and couldn't leave. Nurse reported giving patient medications at 2130 included Oxy 15 mg and Remeron). At midnight patient was found to be hypoxic and non-responsive. Given 0.4 mg Narcan without improvement. Intubated and transferred to ICU.  Patient was transferred to stepdown and care was transferred to tried hospitalist, assumed care on 5/14.  On the morning of 5/14, patient had a rapid response with respiratory distress, failed with NRB mask at 15 L, was placed on BiPAP, Lasix 60 mg IV x1 given.  Assessment & Plan    Principal Problem:   Acute respiratory failure with hypoxia (HCC) with COPD, MRSA pneumonia, aspiration event -patient was intubated on 5/10 for acute respiratory failure secondary toMRSA pneumonia and possible aspiration event from oversedation -Extubated on 5/12, transfer to [facility] service 5/14, continue vancomycin to complete a seven-day course for MRSA pneumonia, day 5 now -5/15 became hypoxic and hypercarbic secondary to volume overload likely from fluid resuscitation -now off BiPAP, continue Lasix will increase dose to 40 mg every 12 -chest x-ray 5/14 with small to moderate right and small left pleural  effusions, bibasilar opacities -I have cut down sedating medications and stopped IV fentanyl -Echocardiogram shows preserved ejection fraction and normal wall motion  -monitor urine output, B met, wean O2  Tonsillar CA status post radiation -Long history of dysphagia now likely worsened following intubation -SLP following for modified barium swallow today -repeat CT neck this admission shows no evidence of recurrence -Follow-up with oncology  Hypotension: Patient has a history of hypertension -Required pressor support on 5/11, currently resolved, 2D echo 9/18 showed EF of 65% no wall motion abnormalities  Acute metabolic encephalopathy with a history of alcohol, polysubstance abuse. - UDS on arrival positive for opiates, cocaine and benzodiazepines.  Patient was placed on Precedex drip, fentanyl drip in ICU, currently off. -counseled -mentation improved back to baseline    Malnutrition of moderate degree (HCC) -Continue nutritional supplements    Anemia in chronic illness -H&H currently stable, 10.1  Nicotine abuse Placed on nicotine patch   Code Status: Full code DVT Prophylaxis:  Lovenox  Family Communication: no family at bedside  Disposition Plan:  Time Spent in minutes   35 minutes  Procedures:  Intubation extubation while in ICU  Consultants:  Pulmonology  Antimicrobials:    IV Zithromax IV Rocephin  Medications  Scheduled Meds: . chlorhexidine  15 mL Mouth Rinse BID  . enoxaparin (LOVENOX) injection  40 mg Subcutaneous Daily  . folic acid  1 mg Per Tube Daily  . furosemide  40 mg Intravenous Q12H  . ipratropium-albuterol  3 mL Nebulization TID  . lipase/protease/amylase  12,000 Units Oral QID  . mouth rinse  15 mL Mouth Rinse BID  . [START ON 02/16/2018] pantoprazole sodium  40 mg Oral Daily  . potassium chloride  40 mEq Oral Q2H  . thiamine  100 mg Per Tube Daily   Continuous Infusions: . sodium chloride 10 mL/hr at 02/12/18 0600  . sodium  chloride    . vancomycin Stopped (02/15/18 1052)   PRN Meds:.levalbuterol, ondansetron **OR** ondansetron (ZOFRAN) IV, oxyCODONE, polyethylene glycol, sodium chloride   Antibiotics   Anti-infectives (From admission, onward)   Start     Dose/Rate Route Frequency Ordered Stop   02/15/18 0200  vancomycin (VANCOCIN) 500 mg in sodium chloride 0.9 % 100 mL IVPB     500 mg 100 mL/hr over 60 Minutes Intravenous Every 8 hours 02/14/18 1903     02/13/18 1200  cefTRIAXone (ROCEPHIN) 1 g in sodium chloride 0.9 % 100 mL IVPB  Status:  Discontinued     1 g 200 mL/hr over 30 Minutes Intravenous Every 24 hours 02/13/18 0932 02/15/18 1012   02/12/18 1800  vancomycin (VANCOCIN) 500 mg in sodium chloride 0.9 % 100 mL IVPB  Status:  Discontinued     500 mg 100 mL/hr over 60 Minutes Intravenous Every 12 hours 02/12/18 1238 02/14/18 1903   02/11/18 0245  vancomycin (VANCOCIN) IVPB 1000 mg/200 mL premix  Status:  Discontinued     1,000 mg 200 mL/hr over 60 Minutes Intravenous Every 24 hours 02/11/18 0241 02/12/18 1238   02/11/18 0245  piperacillin-tazobactam (ZOSYN) IVPB 3.375 g  Status:  Discontinued     3.375 g 12.5 mL/hr over 240 Minutes Intravenous Every 8 hours 02/11/18 0241 02/13/18 0911   02/10/18 2200  sulfamethoxazole-trimethoprim (BACTRIM DS,SEPTRA DS) 800-160 MG per tablet 1 tablet  Status:  Discontinued     1 tablet Oral Every 12 hours 02/10/18 1901 02/11/18 0201   02/10/18 0000  sulfamethoxazole-trimethoprim (BACTRIM DS,SEPTRA DS) 800-160 MG tablet     1 tablet Oral 2 times daily 02/10/18 1438 02/17/18 2359   02/07/18 1800  azithromycin (ZITHROMAX) 500 mg in sodium chloride 0.9 % 250 mL IVPB  Status:  Discontinued     500 mg 250 mL/hr over 60 Minutes Intravenous Every 24 hours 02/06/18 2310 02/07/18 0919   02/07/18 1800  cefTRIAXone (ROCEPHIN) 1 g in sodium chloride 0.9 % 100 mL IVPB  Status:  Discontinued     1 g 200 mL/hr over 30 Minutes Intravenous Every 24 hours 02/06/18 2310 02/10/18  1901   02/07/18 1800  azithromycin (ZITHROMAX) tablet 500 mg     500 mg Oral Daily 02/07/18 0919 02/10/18 1748   02/06/18 1800  cefTRIAXone (ROCEPHIN) 1 g in sodium chloride 0.9 % 100 mL IVPB     1 g 200 mL/hr over 30 Minutes Intravenous  Once 02/06/18 1758 02/06/18 2046   02/06/18 1800  azithromycin (ZITHROMAX) 500 mg in sodium chloride 0.9 % 250 mL IVPB     500 mg 250 mL/hr over 60 Minutes Intravenous  Once 02/06/18 1758 02/06/18 2226        Subjective:   -but finally starting to breathe a little  better, -Awaiting swallowing test so he can eat  Objective:   Vitals:   02/15/18 0500 02/15/18 0758 02/15/18 0807 02/15/18 0846  BP:   (!) 155/76   Pulse:   71   Resp:   16   Temp:  97.8 F (36.6 C)    TempSrc:  Oral    SpO2:   92% 100%  Weight: 48.1 kg (106 lb 0.7 oz)     Height:        Intake/Output Summary (Last 24 hours) at 02/15/2018 1458 Last data filed at 02/15/2018 1354 Gross per 24 hour  Intake 868 ml  Output 2650 ml  Net -1782 ml     Wt Readings from Last 3 Encounters:  02/15/18 48.1 kg (106 lb 0.7 oz)  07/02/17 42.9 kg (94 lb 9.6 oz)  04/13/17 43.1 kg (95 lb)     Exam Gen: frail, cachectic African-American male laying in bed, no distress HEENT: oral mucosa moist, right IJ central line noted Lungs: decreased breath sounds at both bases CVS: S1-S2 present/regular rate rhythm, S3 also appreciated Abd: soft, Non tender, non distended, BS present Extremities: 2+ edema in both upper extremities, and only trace edema in legs Skin: no new rashes Psych: Normal affect and demeanor, alert and oriented x 2   Data Reviewed:  I have personally reviewed following labs and imaging studies  Micro Results Recent Results (from the past 240 hour(s))  Blood Culture (routine x 2)     Status: None   Collection Time: 02/06/18  6:02 PM  Result Value Ref Range Status   Specimen Description BLOOD RIGHT ANTECUBITAL  Final   Special Requests   Final    BOTTLES DRAWN  AEROBIC AND ANAEROBIC Blood Culture adequate volume   Culture   Final    NO GROWTH 5 DAYS Performed at [facility], [address] [place], [place] [address]    Report Status 02/11/2018 FINAL  Final  Blood Culture (routine x 2)     Status: None   Collection Time: 02/06/18  6:30 PM  Result Value Ref Range Status   Specimen Description BLOOD LEFT ANTECUBITAL  Final   Special Requests   Final    BOTTLES DRAWN AEROBIC AND ANAEROBIC Blood Culture adequate volume   Culture   Final    NO GROWTH 5 DAYS Performed at [facility], [place] [address], [place], [place] [address]    Report Status 02/11/2018 FINAL  Final  MRSA PCR Screening     Status: Abnormal   Collection Time: 02/07/18  2:26 AM  Result Value Ref Range Status   MRSA by PCR POSITIVE (A) NEGATIVE Final    Comment:        The GeneXpert MRSA Assay (FDA approved for NASAL specimens only), is one component of a comprehensive MRSA colonization surveillance program. It is not intended to diagnose MRSA infection nor to guide or monitor treatment for MRSA infections. RESULT CALLED TO, READ BACK BY AND VERIFIED WITH: Alyssa Grove RN 02/07/18 0631 JDW Performed at [facility], [address] [place], [place] [address]   Culture, sputum-assessment     Status: None   Collection Time: 02/07/18  5:42 PM  Result Value Ref Range Status   Specimen Description EXPECTORATED SPUTUM  Final   Special Requests Normal  Final   Sputum evaluation   Final    THIS SPECIMEN IS ACCEPTABLE FOR SPUTUM CULTURE Performed at [facility], [address], [place],  [address]  Report Status 02/08/2018 FINAL  Final  Culture, respiratory (NON-Expectorated)     Status: None   Collection Time: 02/07/18  5:42 PM  Result Value Ref Range Status   Specimen Description EXPECTORATED SPUTUM  Final   Special Requests Normal Reflexed from [id number]  Final   Gram Stain   Final    ABUNDANT WBC PRESENT,BOTH PMN AND MONONUCLEAR MODERATE GRAM  POSITIVE COCCI RARE YEAST FEW SQUAMOUS EPITHELIAL CELLS PRESENT Performed at [facility], [place] [address] [place], [place] [address]    Culture   Final    MODERATE METHICILLIN RESISTANT STAPHYLOCOCCUS AUREUS   Report Status 02/10/2018 FINAL  Final   Organism ID, Bacteria METHICILLIN RESISTANT STAPHYLOCOCCUS AUREUS  Final      Susceptibility   Methicillin resistant staphylococcus aureus - MIC*    CIPROFLOXACIN >=8 RESISTANT Resistant     ERYTHROMYCIN >=8 RESISTANT Resistant     GENTAMICIN <=0.5 SENSITIVE Sensitive     OXACILLIN >=4 RESISTANT Resistant     TETRACYCLINE <=1 SENSITIVE Sensitive     VANCOMYCIN <=0.5 SENSITIVE Sensitive     TRIMETH/SULFA <=10 SENSITIVE Sensitive     CLINDAMYCIN <=0.25 SENSITIVE Sensitive     RIFAMPIN <=0.5 SENSITIVE Sensitive     Inducible Clindamycin NEGATIVE Sensitive     * MODERATE METHICILLIN RESISTANT STAPHYLOCOCCUS AUREUS    Radiology Reports Dg Chest 2 View  Result Date: 02/06/2018 CLINICAL DATA:  Hypoxia, history of hypertension, COPD is seizures. Tobacco use since age 83. EXAM: CHEST - 2 VIEW COMPARISON:  07/01/2017 FINDINGS: Emphysematous hyperinflation of the lungs with new right small to moderate pleural effusion. Reverse cephalization and engorgement of pulmonary vasculature to the lower lobes likely on the basis of COPD. Slightly more confluent airspace opacity at the right lung base cannot exclude the possibility of pneumonia. Heart and mediastinal contours are within normal limits. Remote right-sided sixth rib fracture. IMPRESSION: COPD with new small to moderate sized right pleural effusion with reversed cephalization of pulmonary vasculature to the lower lobes likely on the basis of upper lobe COPD. More confluent opacities at the right lung base raise concern for right lower lobe pneumonia and/or atelectasis. Electronically Signed   By: Ashley Royalty M.D.   On: 02/06/2018 17:53   Ct Head Wo Contrast  Result Date: 02/06/2018 CLINICAL  DATA:  [place] this afternoon striking head, loss of consciousness, history of tonsillar cancer, COPD, GERD, hypertension, smoker EXAM: CT HEAD WITHOUT CONTRAST CT CERVICAL SPINE WITHOUT CONTRAST TECHNIQUE: Multidetector CT imaging of the head and cervical spine was performed following the standard protocol without intravenous contrast. Multiplanar CT image reconstructions of the cervical spine were also generated. COMPARISON:  CT soft tissue neck 04/23/2016 FINDINGS: CT HEAD FINDINGS Brain: Normal ventricular morphology. No midline shift or mass effect. Normal appearance of brain parenchyma. No intracranial hemorrhage, mass lesion, or evidence of acute infarction. No extra-axial fluid collections. Vascular: Mild atherosclerotic calcification of internal carotid arteries bilaterally at skull base Skull: Calvaria intact Sinuses/Orbits: Mucosal thickening LEFT maxillary sinus. Remaining visualized paranasal sinuses clear Other: N/A CT CERVICAL SPINE FINDINGS Alignment: Normal Skull base and vertebrae: Osseous mineralization normal. Visualized skull base intact. Vertebral body and disc space heights maintained. No fracture, subluxation or bone destruction. Soft tissues and spinal canal: Prevertebral soft tissues are mildly prominent with fluid versus mucous in the hypopharynx. Scattered atherosclerotic calcifications of the carotid and vertebral arteries. Diffuse infiltration of soft tissue planes in the parapharyngeal region, which may be due to prior radiation therapy or tumor, suboptimally assessed by  noncontrast exam, appears more pronounced than on priors contrast-enhanced CT soft tissue neck study. Narrowing of airway at the larynx and supraglottic region. Suboptimal assessment for adenopathy due to loss of tissue planes. Disc levels: Broad-based disc herniation at C6-C7 indenting thecal sac. Upper chest: Lung apices appear emphysematous but clear. Other: N/A IMPRESSION: No acute intracranial abnormalities.1 No  acute cervical spine abnormalities. Emphysematous changes at lung apices. Fluid/mucous within the hypopharynx with narrowing of the airway at the level of the larynx and supraglottic region. Diffuse infiltration of soft tissue planes in the cervical region which may be related to prior radiation therapy for tonsillar cancer though recurrent tumor could cause a similar appearance; recommend follow-up dedicated CT soft tissue neck imaging with contrast when clinical condition permits to further assess. Electronically Signed   By: Lavonia Dana M.D.   On: 02/06/2018 17:35   Ct Soft Tissue Neck W Contrast  Result Date: 02/09/2018 CLINICAL DATA:  62 y/o M; history of tonsillar cancer post radiation therapy. EXAM: CT NECK WITH CONTRAST TECHNIQUE: Multidetector CT imaging of the neck was performed using the standard protocol following the bolus administration of intravenous contrast. CONTRAST:  181m OMNIPAQUE IOHEXOL 300 MG/ML  SOLN COMPARISON:  04/23/2016 CT of the neck. 02/06/2018 CT of cervical spine. FINDINGS: Pharynx and larynx: No nodular mucosal enhancement of the oropharynx or tonsils to suggest recurrent disease. Mild smooth low-attenuation mucosal thickening of the oropharynx compatible chronic post radiation changes. No new exophytic mass or abnormal nodular enhancement of the aerodigestive tract. Salivary glands: Left submandibular gland resection. No inflammation, mass, or stone of residual salivary glands. Thyroid: Normal. Lymph nodes: Left neck dissection. No mass within the resection bed identified. No lymphadenopathy by imaging criteria, lymph node necrosis, or abnormal enhancement. Vascular: Diffuse irregularity of the carotid systems multiple areas of mild less than 50% stenosis compatible with atherosclerotic and post radiation changes. 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 Limited intracranial: Negative. Visualized orbits: Negative. Mastoids and visualized paranasal sinuses:  Moderate left maxillary sinus mucosal thickening, small fluid level, and chronic inflammatory changes of the wall of the sinus. Paranasal sinuses and mastoid air cells are otherwise normally aerated. Skeleton: Mild cervical spondylosis. No high-grade bony canal stenosis. No lytic or blastic lesion. Upper chest: Moderate emphysema.  Small right pleural effusion. Other: None. IMPRESSION: 1. Stable post treatment changes in the neck. No evidence of recurrent disease. No lymphadenopathy. 2. Stable chronic findings as above. Electronically Signed   By: LKristine GarbeM.D.   On: 02/09/2018 18:39   Ct Angio Chest Pe W Or Wo Contrast  Result Date: 02/07/2018 CLINICAL DATA:  62year old male with shortness of breath. History of pancreatitis and throat cancer. Weight loss. EXAM: CT ANGIOGRAPHY CHEST CT ABDOMEN AND PELVIS WITH CONTRAST TECHNIQUE: Multidetector CT imaging of the chest was performed using the standard protocol during bolus administration of intravenous contrast. Multiplanar CT image reconstructions and MIPs were obtained to evaluate the vascular anatomy. Multidetector CT imaging of the abdomen and pelvis was performed using the standard protocol during bolus administration of intravenous contrast. CONTRAST:  815mISOVUE-370 IOPAMIDOL (ISOVUE-370) INJECTION 76% COMPARISON:  CT of the abdomen pelvis dated 07/11/2016 and radiograph dated 03/10/2017 FINDINGS: Evaluation is limited due to streak artifact caused by patient's arms and overlying metallic support device. Evaluation is also limited due to anasarca and cachexia. CTA CHEST FINDINGS Cardiovascular: There is mild cardiomegaly. No pericardial effusion. There is coronary vascular calcification primarily involving the LAD. The aorta is poorly visualized and suboptimally  opacified but grossly unremarkable. Evaluation of the pulmonary arteries is limited due to suboptimal enhancement of the peripheral branches. Apparent areas of decreased enhancement  in the periphery of the distal branches of the right lower lobe (series 9 images 186-191) likely artifactual and related to volume averaging artifact with adjacent bronchi as well as poor contrast opacification. No definite CT evidence of pulmonary embolism. Mediastinum/Nodes: There is fullness of the right hilum measuring 15 mm in short axis. Ill-defined soft tissue density extending from the hila along the bronchi are not well evaluated but likely thickened bronchial wall with endobronchial debris. Evaluation of the mediastinum and hila is very limited. No mediastinal fluid collection. The esophagus is not well visualized. Lungs/Pleura: There are extensive consolidative changes of the lung bases and lower lobes progressed compared to the prior CT. There is diffuse areas of nodularity is at the lung bases. Diffuse thickening of the bronchial walls with mucous impaction involving the lower lobes. There is mucous impaction of the right lower lumbar bronchus. There is narrowing of the central bronchi likely secondary to edema. There is a small to moderate right and trace left pleural effusion. There is a background of emphysema. Minimal amount of air in the left apical pleural surface noted, appears chronic and seen on the CT of the cervical spine dated 02/06/2018. No pneumothorax. Musculoskeletal: Diffuse soft tissue edema and anasarca. There is loss of subcutaneous fat and cachexia. No acute osseous pathology. Extensive right chest wall venous collaterals possibly related to a degree of SVC occlusion. Review of the MIP images confirms the above findings. CT ABDOMEN and PELVIS FINDINGS No intra-abdominal free air or free fluid. Hepatobiliary: The liver is grossly unremarkable. No intrahepatic biliary ductal dilatation. Cholecystectomy. Pancreas: Changes of chronic pancreatitis with dystrophic calcification and dilatation of the main pancreatic duct. Evaluation of the pancreas is limited due to suboptimal  visualization. Spleen: Normal in size without focal abnormality. Adrenals/Urinary Tract: The adrenal glands are unremarkable. Small right renal cyst. The kidneys, kidneys and urinary bladder appear unremarkable. Stomach/Bowel: Oral contrast mixed with stool noted throughout the colon. There is no bowel dilatation or evidence of obstruction. The appendix is not identified with certainty. Vascular/Lymphatic: Extensive aortoiliac atherosclerotic disease. No portal venous gas. Evaluation for adenopathy is limited due to diffuse edema and anasarca as well as paucity of abdominal fat. Reproductive: The prostate and seminal vesicles are grossly unremarkable. Other: Diffuse edema and anasarca as well as cachexia. Musculoskeletal: No acute osseous pathology.  Osteopenia. Review of the MIP images confirms the above findings. IMPRESSION: 1. No definite CT evidence of pulmonary embolism. 2. Diffuse thickening of the bronchial wall with endobronchial mucous content predominantly involving the lower lobes. Large areas of airspace opacity involving the lower lobes and lung bases bilaterally, right greater left most consistent with pneumonia possibly related to aspiration. Clinical correlation is recommended. Bilateral pleural effusions, right greater left. 3. No definite acute intra-abdominal or pelvic pathology. 4. Diffuse soft tissue edema and anasarca.  Cachexia. Electronically Signed   By: Anner Crete M.D.   On: 02/07/2018 02:40   Ct Cervical Spine Wo Contrast  Result Date: 02/06/2018 CLINICAL DATA:  [place] this afternoon striking head, loss of consciousness, history of tonsillar cancer, COPD, GERD, hypertension, smoker EXAM: CT HEAD WITHOUT CONTRAST CT CERVICAL SPINE WITHOUT CONTRAST TECHNIQUE: Multidetector CT imaging of the head and cervical spine was performed following the standard protocol without intravenous contrast. Multiplanar CT image reconstructions of the cervical spine were also generated. COMPARISON:  CT  soft tissue  neck 04/23/2016 FINDINGS: CT HEAD FINDINGS Brain: Normal ventricular morphology. No midline shift or mass effect. Normal appearance of brain parenchyma. No intracranial hemorrhage, mass lesion, or evidence of acute infarction. No extra-axial fluid collections. Vascular: Mild atherosclerotic calcification of internal carotid arteries bilaterally at skull base Skull: Calvaria intact Sinuses/Orbits: Mucosal thickening LEFT maxillary sinus. Remaining visualized paranasal sinuses clear Other: N/A CT CERVICAL SPINE FINDINGS Alignment: Normal Skull base and vertebrae: Osseous mineralization normal. Visualized skull base intact. Vertebral body and disc space heights maintained. No fracture, subluxation or bone destruction. Soft tissues and spinal canal: Prevertebral soft tissues are mildly prominent with fluid versus mucous in the hypopharynx. Scattered atherosclerotic calcifications of the carotid and vertebral arteries. Diffuse infiltration of soft tissue planes in the parapharyngeal region, which may be due to prior radiation therapy or tumor, suboptimally assessed by noncontrast exam, appears more pronounced than on priors contrast-enhanced CT soft tissue neck study. Narrowing of airway at the larynx and supraglottic region. Suboptimal assessment for adenopathy due to loss of tissue planes. Disc levels: Broad-based disc herniation at C6-C7 indenting thecal sac. Upper chest: Lung apices appear emphysematous but clear. Other: N/A IMPRESSION: No acute intracranial abnormalities.1 No acute cervical spine abnormalities. Emphysematous changes at lung apices. Fluid/mucous within the hypopharynx with narrowing of the airway at the level of the larynx and supraglottic region. Diffuse infiltration of soft tissue planes in the cervical region which may be related to prior radiation therapy for tonsillar cancer though recurrent tumor could cause a similar appearance; recommend follow-up dedicated CT soft tissue neck  imaging with contrast when clinical condition permits to further assess. Electronically Signed   By: Lavonia Dana M.D.   On: 02/06/2018 17:35   Ct Abdomen Pelvis W Contrast  Result Date: 02/07/2018 CLINICAL DATA:  62 year old male with shortness of breath. History of pancreatitis and throat cancer. Weight loss. EXAM: CT ANGIOGRAPHY CHEST CT ABDOMEN AND PELVIS WITH CONTRAST TECHNIQUE: Multidetector CT imaging of the chest was performed using the standard protocol during bolus administration of intravenous contrast. Multiplanar CT image reconstructions and MIPs were obtained to evaluate the vascular anatomy. Multidetector CT imaging of the abdomen and pelvis was performed using the standard protocol during bolus administration of intravenous contrast. CONTRAST:  57m ISOVUE-370 IOPAMIDOL (ISOVUE-370) INJECTION 76% COMPARISON:  CT of the abdomen pelvis dated 07/11/2016 and radiograph dated 03/10/2017 FINDINGS: Evaluation is limited due to streak artifact caused by patient's arms and overlying metallic support device. Evaluation is also limited due to anasarca and cachexia. CTA CHEST FINDINGS Cardiovascular: There is mild cardiomegaly. No pericardial effusion. There is coronary vascular calcification primarily involving the LAD. The aorta is poorly visualized and suboptimally opacified but grossly unremarkable. Evaluation of the pulmonary arteries is limited due to suboptimal enhancement of the peripheral branches. Apparent areas of decreased enhancement in the periphery of the distal branches of the right lower lobe (series 9 images [id number]) likely artifactual and related to volume averaging artifact with adjacent bronchi as well as poor contrast opacification. No definite CT evidence of pulmonary embolism. Mediastinum/Nodes: There is fullness of the right hilum measuring 15 mm in short axis. Ill-defined soft tissue density extending from the hila along the bronchi are not well evaluated but likely thickened  bronchial wall with endobronchial debris. Evaluation of the mediastinum and hila is very limited. No mediastinal fluid collection. The esophagus is not well visualized. Lungs/Pleura: There are extensive consolidative changes of the lung bases and lower lobes progressed compared to the prior CT. There is diffuse areas of nodularity  is at the lung bases. Diffuse thickening of the bronchial walls with mucous impaction involving the lower lobes. There is mucous impaction of the right lower lumbar bronchus. There is narrowing of the central bronchi likely secondary to edema. There is a small to moderate right and trace left pleural effusion. There is a background of emphysema. Minimal amount of air in the left apical pleural surface noted, appears chronic and seen on the CT of the cervical spine dated 02/06/2018. No pneumothorax. Musculoskeletal: Diffuse soft tissue edema and anasarca. There is loss of subcutaneous fat and cachexia. No acute osseous pathology. Extensive right chest wall venous collaterals possibly related to a degree of SVC occlusion. Review of the MIP images confirms the above findings. CT ABDOMEN and PELVIS FINDINGS No intra-abdominal free air or free fluid. Hepatobiliary: The liver is grossly unremarkable. No intrahepatic biliary ductal dilatation. Cholecystectomy. Pancreas: Changes of chronic pancreatitis with dystrophic calcification and dilatation of the main pancreatic duct. Evaluation of the pancreas is limited due to suboptimal visualization. Spleen: Normal in size without focal abnormality. Adrenals/Urinary Tract: The adrenal glands are unremarkable. Small right renal cyst. The kidneys, kidneys and urinary bladder appear unremarkable. Stomach/Bowel: Oral contrast mixed with stool noted throughout the colon. There is no bowel dilatation or evidence of obstruction. The appendix is not identified with certainty. Vascular/Lymphatic: Extensive aortoiliac atherosclerotic disease. No portal venous  gas. Evaluation for adenopathy is limited due to diffuse edema and anasarca as well as paucity of abdominal fat. Reproductive: The prostate and seminal vesicles are grossly unremarkable. Other: Diffuse edema and anasarca as well as cachexia. Musculoskeletal: No acute osseous pathology.  Osteopenia. Review of the MIP images confirms the above findings. IMPRESSION: 1. No definite CT evidence of pulmonary embolism. 2. Diffuse thickening of the bronchial wall with endobronchial mucous content predominantly involving the lower lobes. Large areas of airspace opacity involving the lower lobes and lung bases bilaterally, right greater left most consistent with pneumonia possibly related to aspiration. Clinical correlation is recommended. Bilateral pleural effusions, right greater left. 3. No definite acute intra-abdominal or pelvic pathology. 4. Diffuse soft tissue edema and anasarca.  Cachexia. Electronically Signed   By: Anner Crete M.D.   On: 02/07/2018 02:40   Dg Chest Port 1 View  Result Date: 02/14/2018 CLINICAL DATA:  Acute onset of respiratory distress. EXAM: PORTABLE CHEST 1 VIEW COMPARISON:  Chest radiograph performed 02/13/2018, and CTA of the chest performed 02/07/2018 FINDINGS: The lungs are well-aerated. Small to moderate right and small left pleural effusions are again noted. Bibasilar airspace opacities are concerning for pneumonia given the appearance on prior CTA, perhaps slightly worsened from the prior study. No pneumothorax is seen. The cardiomediastinal silhouette is within normal limits. No acute osseous abnormalities are seen. IMPRESSION: Small to moderate right and small left pleural effusions again noted. Bibasilar airspace opacities are concerning for pneumonia given the appearance on prior CTA, perhaps slightly worsened from the prior study. Electronically Signed   By: Garald Balding M.D.   On: 02/14/2018 06:23   Dg Chest Port 1 View  Result Date: 02/13/2018 CLINICAL DATA:   Respiratory failure EXAM: PORTABLE CHEST 1 VIEW COMPARISON:  02/12/2018 FINDINGS: Cardiac shadow is stable. Endotracheal tube and nasogastric catheter have been removed in the interval. Some slight increase in right pleural effusion is noted although this may be positional in nature. Small left pleural effusion is again noted. Bibasilar infiltrates are seen stable from the prior exam. IMPRESSION: No significant interval change from the prior study. Electronically Signed  By: Inez Catalina M.D.   On: 02/13/2018 07:10   Dg Chest Port 1 View  Result Date: 02/12/2018 CLINICAL DATA:  Respiratory failure, history tonsillar cancer, COPD, GERD, smoker EXAM: PORTABLE CHEST 1 VIEW COMPARISON:  Portable exam 0704 hours compared to 02/11/2018 FINDINGS: Tip of endotracheal tube is at the level of the aortic arch, likely above carina though the carina is not adequately visualized. Nasogastric tube extends into stomach. Normal heart size, mediastinal contours, and pulmonary vascularity. Emphysematous changes with bibasilar infiltrates and pleural effusions. Upper lungs clear. No pneumothorax. IMPRESSION: Bibasilar pulmonary infiltrates, slightly improved. Persistent bibasilar pleural effusions. Underlying emphysematous changes. Electronically Signed   By: Lavonia Dana M.D.   On: 02/12/2018 08:15   Dg Chest Port 1 View  Result Date: 02/11/2018 CLINICAL DATA:  62 y/o  M; ET tube and OG tube. EXAM: PORTABLE CHEST 1 VIEW COMPARISON:  02/06/2018 chest radiograph.  02/07/2018 CT chest. FINDINGS: Stable cardiac silhouette given projection and technique. Small right larger than left pleural effusions and bibasilar consolidations. Endotracheal tube tip 4.6 cm above the carina. Enteric tube tip projects over gastric body. Right upper quadrant surgical clips, presumably cholecystectomy. Bones are unremarkable. IMPRESSION: 1. Endotracheal tube tip 4.6 cm above the carina. Enteric tube tip projects over gastric body. 2. Stable small  right larger than left pleural effusions and bibasilar consolidations of the lungs. Electronically Signed   By: Kristine Garbe M.D.   On: 02/11/2018 02:02   Dg Swallowing Func-speech Pathology  Result Date: 02/15/2018 Objective Swallowing Evaluation: Type of Study: MBS-Modified Barium Swallow Study  Patient Details Name: Fabrizzio Marcella MRN: [id number] Date of Birth: 1956-05-07 Today's Date: 02/15/2018 Time: SLP Start Time (ACUTE ONLY): 3094 -SLP Stop Time (ACUTE ONLY): 1143 SLP Time Calculation (min) (ACUTE ONLY): 24 min Past Medical History: Past Medical History: Diagnosis Date . Anemia  . Arthritis  . Cervical adenopathy 04/26/2012 . Chronic back pain   lower . Chronic pancreatitis ([place])  . Constipation  . COPD (chronic obstructive pulmonary disease) ([place])  . Daily headache 05/23/2012  "small ones" . Deficiency anemia 04/15/2016 . Dyspnea   with exertion . G tube feedings (HCC)   hx of  feeding tube in stomach . GERD (gastroesophageal reflux disease)  . History of blood transfusion ~ 2004  "from the pancreatitis" . History of radiation therapy 02/02/2011-03/22/2011  head/neck,left tonsil . Hx of radiation therapy 02/02/11 to 03/22/11  L tonsil . Hypertension  . Insomnia  . 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 Pancytopenia ([place]) 02/28/2014 . Poor venous access 04/28/2016 . 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 Thrush 11/02/2013 . 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 Urinary hesitancy  Past Surgical History: Past Surgical History: Procedure Laterality Date . BILE DUCT STENT PLACEMENT    hx of . CHOLECYSTECTOMY  04/2005 . DIRECT LARYNGOSCOPY  05/23/2012  Procedure: DIRECT LARYNGOSCOPY;  Surgeon: Rozetta Nunnery, MD;  Location: [place];  Service: ENT;  Laterality: N/A; . DIRECT LARYNGOSCOPY N/A 03/23/2013  Procedure: DIRECT LARYNGOSCOPY;  Surgeon: Rozetta Nunnery, MD;  Location: [place];  Service: ENT;  Laterality: N/A; . ESOPHAGEAL DILATION  N/A 03/23/2013  Procedure: ESOPHAGEAL DILATION;  Surgeon: Rozetta Nunnery, MD;  Location: [place];  Service: ENT;  Laterality: N/A; . ESOPHAGOGASTRODUODENOSCOPY (EGD) WITH PROPOFOL N/A 03/01/2017  Procedure: ESOPHAGOGASTRODUODENOSCOPY (EGD) WITH PROPOFOL;  Surgeon: Ladene Artist, MD;  Location: [facility];  Service: Endoscopy;  Laterality: N/A; . FLEXIBLE SIGMOIDOSCOPY N/A  03/01/2017  Procedure: FLEXIBLE SIGMOIDOSCOPY;  Surgeon: Ladene Artist, MD;  Location: [facility];  Service: Endoscopy;  Laterality: N/A; . IR GENERIC HISTORICAL  04/29/2016  IR US GUIDE VASC ACCESS RIGHT 04/29/2016 [facility] . IR GENERIC HISTORICAL  04/29/2016  IR FLUORO GUIDE CV LINE RIGHT 04/29/2016 [facility] . IR GENERIC HISTORICAL  05/12/2016  IR US GUIDE VASC ACCESS LEFT 05/12/2016 [facility] . IR GENERIC HISTORICAL  05/12/2016  IR FLUORO GUIDE CV LINE LEFT 05/12/2016 [facility] . IR GENERIC HISTORICAL  06/28/2016  IR US GUIDE VASC ACCESS RIGHT 06/28/2016 Sandi Mariscal, MD [facility] . IR GENERIC HISTORICAL  06/28/2016  IR FLUORO GUIDE PORT INSERTION RIGHT 06/28/2016 Sandi Mariscal, MD [facility] . IR REMOVAL TUN ACCESS W/ PORT W/O FL MOD SED  02/10/2017 . MASS BIOPSY  05/23/2012  Procedure: NECK MASS BIOPSY;  Surgeon: Rozetta Nunnery, MD;  Location: [place];  Service: ENT;  Laterality: N/A; . RADICAL NECK DISSECTION  05/23/2012  w/mass excision . RADICAL NECK DISSECTION  05/23/2012  Procedure: RADICAL NECK DISSECTION;  Surgeon: Rozetta Nunnery, MD;  Location: [place];  Service: ENT;  Laterality: Left; . TIBIA FRACTURE SURGERY  1990's  left HPI: Pt is a 62 y.o. male admitted s/p fall (questionable LOC); CXR showed RLL PNA, CT Head negative for acute changes. Pt had a change in status requiring intubation 5/11-5/12. 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   Subjective: pt says he has been trying to spit up phlegm from his throat all morning Assessment / Plan / Recommendation CHL IP CLINICAL IMPRESSIONS 02/15/2018 Clinical Impression Pt's pharyngeal dysphagia has worsened since initial MBS 5/8, likely due to further loss of functional reserve as pt is globally more deconditioned. He now has an increased amount of residue that remains in the valleculae, posterior pharyngeal wall, and pyriform sinuses, which he penetrates after the swallow even with purees - which did not enter his airway on the last study. Pt cannot completely expel penetrates of nectar thick liquids or large boluses of honey thick liquids. SLP provided Min cues for effortful swallows and immediate coughs to better protect the airway and reduce residue with purees and honey thick liquids in small boluses. Recommend Dys 1 diet and honey thick liquids by spoon only, using multiple hard swallows and coughs per bolus. This will likely be an effortful means of getting in nutrition and he remains at risk for dehyrdation/malnutrition. Pt has a chronic risk for aspiration that can be reduced but not eliminated. Continue to recommend additional conversations about [place] with MD.  SLP Visit Diagnosis Dysphagia, pharyngoesophageal phase (R13.14) Attention and concentration deficit following -- Frontal lobe and executive function deficit following -- Impact on safety and function Severe aspiration risk;Moderate aspiration risk;Risk for inadequate nutrition/hydration   CHL IP TREATMENT RECOMMENDATION 02/15/2018 Treatment Recommendations Therapy as outlined in treatment plan below   Prognosis 02/15/2018 Prognosis for Safe Diet Advancement Guarded Barriers to Reach Goals Time post onset;Severity of deficits Barriers/Prognosis Comment -- CHL IP DIET  RECOMMENDATION 02/15/2018 SLP Diet Recommendations Dysphagia 1 (Puree) solids;Honey thick liquids Liquid Administration via Spoon Medication Administration Crushed with puree Compensations Slow rate;Small sips/bites;Hard cough after swallow;Multiple dry swallows after each bite/sip Postural Changes Seated upright at 90 degrees;Remain semi-upright after  after feeds/meals (Comment)   CHL IP OTHER RECOMMENDATIONS 02/15/2018 Recommended Consults -- Oral Care Recommendations Oral care QID Other Recommendations Have oral suction available;Order thickener from pharmacy;Prohibited food (jello, ice cream, thin soups);Remove water pitcher   CHL IP FOLLOW UP RECOMMENDATIONS 02/15/2018 Follow up Recommendations Skilled Nursing facility   [facility] IP FREQUENCY AND DURATION 02/15/2018 Speech Therapy Frequency (ACUTE ONLY) min 2x/week Treatment Duration 2 weeks      CHL IP ORAL PHASE 02/15/2018 Oral Phase WFL Oral - Pudding Teaspoon -- Oral - Pudding Cup -- Oral - Honey Teaspoon -- Oral - Honey Cup -- Oral - Nectar Teaspoon -- Oral - Nectar Cup -- Oral - Nectar Straw -- Oral - Thin Teaspoon -- Oral - Thin Cup -- Oral - Thin Straw -- Oral - Puree -- Oral - Mech Soft -- Oral - Regular -- Oral - Multi-Consistency -- Oral - Pill -- Oral Phase - Comment --  CHL IP PHARYNGEAL PHASE 02/15/2018 Pharyngeal Phase Impaired Pharyngeal- Pudding Teaspoon -- Pharyngeal -- Pharyngeal- Pudding Cup -- Pharyngeal -- Pharyngeal- Honey Teaspoon Delayed swallow initiation-vallecula;Reduced pharyngeal peristalsis;Reduced epiglottic inversion;Reduced anterior laryngeal mobility;Reduced laryngeal elevation;Reduced airway/laryngeal closure;Reduced tongue base retraction;Pharyngeal residue - valleculae;Pharyngeal residue - pyriform;Pharyngeal residue - posterior pharnyx;Penetration/Apiration after swallow Pharyngeal Material enters airway, remains ABOVE vocal cords and not ejected out Pharyngeal- Honey Cup Delayed swallow initiation-vallecula;Reduced pharyngeal  peristalsis;Reduced epiglottic inversion;Reduced anterior laryngeal mobility;Reduced laryngeal elevation;Reduced airway/laryngeal closure;Reduced tongue base retraction;Pharyngeal residue - valleculae;Pharyngeal residue - pyriform;Pharyngeal residue - posterior pharnyx;Penetration/Apiration after swallow Pharyngeal Material enters airway, remains ABOVE vocal cords and not ejected out Pharyngeal- Nectar Teaspoon Delayed swallow initiation-vallecula;Reduced pharyngeal peristalsis;Reduced epiglottic inversion;Reduced anterior laryngeal mobility;Reduced laryngeal elevation;Reduced airway/laryngeal closure;Reduced tongue base retraction;Pharyngeal residue - valleculae;Pharyngeal residue - pyriform;Pharyngeal residue - posterior pharnyx;Penetration/Aspiration during swallow;Penetration/Apiration after swallow Pharyngeal -- Pharyngeal- Nectar Cup NT Pharyngeal -- Pharyngeal- Nectar Straw -- Pharyngeal -- Pharyngeal- Thin Teaspoon NT Pharyngeal -- Pharyngeal- Thin Cup NT Pharyngeal -- Pharyngeal- Thin Straw -- Pharyngeal -- Pharyngeal- Puree Delayed swallow initiation-vallecula;Reduced pharyngeal peristalsis;Reduced epiglottic inversion;Reduced anterior laryngeal mobility;Reduced laryngeal elevation;Reduced airway/laryngeal closure;Reduced tongue base retraction;Pharyngeal residue - valleculae;Pharyngeal residue - pyriform;Pharyngeal residue - posterior pharnyx;Penetration/Apiration after swallow Pharyngeal Material enters airway, remains ABOVE vocal cords and not ejected out Pharyngeal- Mechanical Soft -- Pharyngeal -- Pharyngeal- Regular -- Pharyngeal -- Pharyngeal- Multi-consistency -- Pharyngeal -- Pharyngeal- Pill -- Pharyngeal -- Pharyngeal Comment --  CHL IP CERVICAL ESOPHAGEAL PHASE 02/15/2018 Cervical Esophageal Phase Impaired Pudding Teaspoon -- Pudding Cup -- Honey Teaspoon Reduced cricopharyngeal relaxation Honey Cup Reduced cricopharyngeal relaxation Nectar Teaspoon Reduced cricopharyngeal relaxation Nectar Cup  NT Nectar Straw -- Thin Teaspoon NT Thin Cup NT Thin Straw -- Puree Reduced cricopharyngeal relaxation Mechanical Soft -- Regular -- Multi-consistency -- Pill -- Cervical Esophageal Comment -- No flowsheet data found. [name] 02/15/2018, 2:40 PM  [name], M.A. CCC-SLP [phone]             Dg Swallowing Func-speech Pathology  Result Date: 02/08/2018 Objective Swallowing Evaluation: Type of Study: MBS-Modified Barium Swallow Study  Patient Details Name: Leeroy Lovings MRN: [id number] Date of Birth: 1956-09-11 Today's Date: 02/08/2018 Time: SLP Start Time (ACUTE ONLY): 1345 -SLP Stop Time (ACUTE ONLY): 1407 SLP Time Calculation (min) (ACUTE ONLY): 22 min Past Medical History: Past Medical History: Diagnosis Date . Anemia  . Arthritis  . Cervical adenopathy 04/26/2012 . Chronic back pain   lower . Chronic pancreatitis ([place])  . Constipation  . COPD (chronic obstructive pulmonary disease) ([place])  . Daily headache 05/23/2012  "small ones" . Deficiency anemia 04/15/2016 .  Dyspnea   with exertion . G tube feedings (HCC)   hx of  feeding tube in stomach . GERD (gastroesophageal reflux disease)  . History of blood transfusion ~ 2004  "from the pancreatitis" . History of radiation therapy 02/02/2011-03/22/2011  head/neck,left tonsil . Hx of radiation therapy 02/02/11 to 03/22/11  L tonsil . Hypertension  . Insomnia  . 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 Pancytopenia ([place]) 02/28/2014 . Poor venous access 04/28/2016 . 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 Thrush 11/02/2013 . 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 Urinary hesitancy  Past Surgical History: Past Surgical History: Procedure Laterality Date . BILE DUCT STENT PLACEMENT    hx of . CHOLECYSTECTOMY  04/2005 . DIRECT LARYNGOSCOPY  05/23/2012  Procedure: DIRECT LARYNGOSCOPY;  Surgeon: Rozetta Nunnery, MD;  Location: [place];  Service: ENT;  Laterality: N/A; . DIRECT LARYNGOSCOPY N/A 03/23/2013  Procedure:  DIRECT LARYNGOSCOPY;  Surgeon: Rozetta Nunnery, MD;  Location: [place];  Service: ENT;  Laterality: N/A; . ESOPHAGEAL DILATION N/A 03/23/2013  Procedure: ESOPHAGEAL DILATION;  Surgeon: Rozetta Nunnery, MD;  Location: [place];  Service: ENT;  Laterality: N/A; . ESOPHAGOGASTRODUODENOSCOPY (EGD) WITH PROPOFOL N/A 03/01/2017  Procedure: ESOPHAGOGASTRODUODENOSCOPY (EGD) WITH PROPOFOL;  Surgeon: Ladene Artist, MD;  [facility];  Service: Endoscopy;  Laterality: N/A; . FLEXIBLE SIGMOIDOSCOPY N/A 03/01/2017  Procedure: FLEXIBLE SIGMOIDOSCOPY;  Surgeon: Ladene Artist, MD;  [facility];  Service: Endoscopy;  Laterality: N/A; . IR GENERIC HISTORICAL  04/29/2016  IR US GUIDE VASC ACCESS RIGHT 04/29/2016 [facility] . IR GENERIC HISTORICAL  04/29/2016  IR FLUORO GUIDE CV LINE RIGHT 04/29/2016 [facility] . IR GENERIC HISTORICAL  05/12/2016  IR US GUIDE VASC ACCESS LEFT 05/12/2016 [facility] . IR GENERIC HISTORICAL  05/12/2016  IR FLUORO GUIDE CV LINE LEFT 05/12/2016 [facility] . IR GENERIC HISTORICAL  06/28/2016  IR US GUIDE VASC ACCESS RIGHT 06/28/2016 Sandi Mariscal, MD [facility] . IR GENERIC HISTORICAL  06/28/2016  IR FLUORO GUIDE PORT INSERTION RIGHT 06/28/2016 Sandi Mariscal, MD [facility] . IR REMOVAL TUN ACCESS W/ PORT W/O FL MOD SED  02/10/2017 . MASS BIOPSY  05/23/2012  Procedure: NECK MASS BIOPSY;  Surgeon: Rozetta Nunnery, MD;  Location: [place];  Service: ENT;  Laterality: N/A; . RADICAL NECK DISSECTION  05/23/2012  w/mass excision . RADICAL NECK DISSECTION  05/23/2012  Procedure: RADICAL NECK DISSECTION;  Surgeon: Rozetta Nunnery, MD;  Location: [place];  Service: ENT;  Laterality: Left; . TIBIA FRACTURE SURGERY  1990's  left HPI: Pt is a 62 y.o. male admitted s/p fall (questionable LOC); CXR showed RLL PNA, CT Head negative for acute changes. 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   Subjective: describes having to puree all his food to get it to go down at home Assessment / Plan / Recommendation CHL IP CLINICAL IMPRESSIONS 02/08/2018 Clinical Impression Pt has a moderate pharyngeal dysphagia suspected to be chronic in nature secondary to history of radiation tx for throat cancer. He has minimal hyolaryngeal excursion, epiglottic deflection, base of tongue retraction, and pharyngeal squeeze, resulting in poor swallow efficiency and limited airway closure. Moderate residue remains at the entrance  of the laryngeal vestibule and pyriform sinuses with thin and nectar thick liquids, which are silently penetrated/aspiration during the swallow. His cough is not effective at clearing aspirates, but if he performs a heard cough and re-swallow he can clear most penetrates with cup sips of nectar thick liquids and spoonfuls of thin liquids, as penetration is more shallow. Increasing amounts of residue remain in the valleculae as consistencies become thicker, with honey thick liquids still penetrating into the laryngeal vestibule. Pt is likely at a high risk for aspiration, malnutrition, and dehydration with any consistencies recommended. In light of his acute PNA would favor a more conservative diet of Dys 1 textures and nectar thick liquids by cup using hard swallows, hard coughs, and hard second swallows. Teaspoons of thin water could be trialed between meals after oral care using the same strategies. MD may also wish to consider conversation about overall GOC given his likely chronic dysphagia. SLP Visit Diagnosis Dysphagia, pharyngoesophageal phase (R13.14) Attention and concentration deficit following -- Frontal lobe and executive function  deficit following -- Impact on safety and function Severe aspiration risk;Moderate aspiration risk;Risk for inadequate nutrition/hydration   CHL IP TREATMENT RECOMMENDATION 02/08/2018 Treatment Recommendations Therapy as outlined in treatment plan below   Prognosis 02/08/2018 Prognosis for Safe Diet Advancement Guarded Barriers to Reach Goals Time post onset;Severity of deficits Barriers/Prognosis Comment -- CHL IP DIET RECOMMENDATION 02/08/2018 SLP Diet Recommendations Dysphagia 1 (Puree) solids;Nectar thick liquid Liquid Administration via Cup;No straw Medication Administration Crushed with puree Compensations Slow rate;Small sips/bites;Hard cough after swallow;Multiple dry swallows after each bite/sip Postural Changes Remain semi-upright after after feeds/meals (Comment);Seated upright at 90 degrees   CHL IP OTHER RECOMMENDATIONS 02/08/2018 Recommended Consults -- Oral Care Recommendations Oral care QID Other Recommendations Have oral suction available;Order thickener from pharmacy;Prohibited food (jello, ice cream, thin soups);Remove water pitcher   CHL IP FOLLOW UP RECOMMENDATIONS 02/08/2018 Follow up Recommendations (No Data)   CHL IP FREQUENCY AND DURATION 02/08/2018 Speech Therapy Frequency (ACUTE ONLY) min 2x/week Treatment Duration 2 weeks      CHL IP ORAL PHASE 02/08/2018 Oral Phase WFL Oral - Pudding Teaspoon -- Oral - Pudding Cup -- Oral - Honey Teaspoon -- Oral - Honey Cup -- Oral - Nectar Teaspoon -- Oral - Nectar Cup -- Oral - Nectar Straw -- Oral - Thin Teaspoon -- Oral - Thin Cup -- Oral - Thin Straw -- Oral - Puree -- Oral - Mech Soft -- Oral - Regular -- Oral - Multi-Consistency -- Oral - Pill -- Oral Phase - Comment --  CHL IP PHARYNGEAL PHASE 02/08/2018 Pharyngeal Phase Impaired Pharyngeal- Pudding Teaspoon -- Pharyngeal -- Pharyngeal- Pudding Cup -- Pharyngeal -- Pharyngeal- Honey Teaspoon -- Pharyngeal -- Pharyngeal- Honey Cup Delayed swallow initiation-vallecula;Reduced pharyngeal peristalsis;Reduced  epiglottic inversion;Reduced anterior laryngeal mobility;Reduced laryngeal elevation;Reduced airway/laryngeal closure;Reduced tongue base retraction;Pharyngeal residue - valleculae;Penetration/Aspiration during swallow Pharyngeal Material enters airway, remains ABOVE vocal cords and not ejected out Pharyngeal- Nectar Teaspoon -- Pharyngeal -- Pharyngeal- Nectar Cup Reduced pharyngeal peristalsis;Reduced epiglottic inversion;Reduced anterior laryngeal mobility;Reduced laryngeal elevation;Reduced airway/laryngeal closure;Reduced tongue base retraction;Pharyngeal residue - valleculae;Penetration/Aspiration during swallow;Penetration/Aspiration before swallow;Delayed swallow initiation-pyriform sinuses;Compensatory strategies attempted (with notebox) Pharyngeal Material enters airway, remains ABOVE vocal cords and not ejected out Pharyngeal- Nectar Straw -- Pharyngeal -- Pharyngeal- Thin Teaspoon Reduced pharyngeal peristalsis;Reduced epiglottic inversion;Reduced anterior laryngeal mobility;Reduced laryngeal elevation;Reduced airway/laryngeal closure;Reduced tongue base retraction;Pharyngeal residue - valleculae;Penetration/Aspiration during swallow;Delayed swallow initiation-vallecula;Compensatory strategies attempted (with notebox) Pharyngeal Material enters airway, remains ABOVE vocal cords and not ejected out Pharyngeal- Thin Cup Reduced  pharyngeal peristalsis;Reduced epiglottic inversion;Reduced anterior laryngeal mobility;Reduced laryngeal elevation;Reduced airway/laryngeal closure;Reduced tongue base retraction;Pharyngeal residue - valleculae;Delayed swallow initiation-pyriform sinuses;Penetration/Aspiration before swallow;Penetration/Aspiration during swallow;Compensatory strategies attempted (with notebox) Pharyngeal Material enters airway, passes BELOW cords without attempt by patient to eject out (silent aspiration) Pharyngeal- Thin Straw -- Pharyngeal -- Pharyngeal- Puree Delayed swallow  initiation-vallecula;Reduced pharyngeal peristalsis;Reduced epiglottic inversion;Reduced anterior laryngeal mobility;Reduced laryngeal elevation;Reduced airway/laryngeal closure;Reduced tongue base retraction;Pharyngeal residue - valleculae Pharyngeal -- Pharyngeal- Mechanical Soft -- Pharyngeal -- Pharyngeal- Regular -- Pharyngeal -- Pharyngeal- Multi-consistency -- Pharyngeal -- Pharyngeal- Pill -- Pharyngeal -- Pharyngeal Comment --  CHL IP CERVICAL ESOPHAGEAL PHASE 02/08/2018 Cervical Esophageal Phase Impaired Pudding Teaspoon -- Pudding Cup -- Honey Teaspoon -- Honey Cup Reduced cricopharyngeal relaxation Nectar Teaspoon -- Nectar Cup Reduced cricopharyngeal relaxation Nectar Straw -- Thin Teaspoon Reduced cricopharyngeal relaxation Thin Cup Reduced cricopharyngeal relaxation Thin Straw -- Puree Reduced cricopharyngeal relaxation Mechanical Soft -- Regular -- Multi-consistency -- Pill -- Cervical Esophageal Comment -- No flowsheet data found. [name] 02/08/2018, 3:06 PM  [name], M.A. CCC-SLP [phone]              Lab Data:  CBC: Recent Labs  Lab 02/11/18 0727 02/12/18 0413 02/13/18 0803 02/14/18 0353 02/15/18 0351  WBC 7.5 6.5 9.7 5.5 3.4*  HGB 10.6* 8.8* 10.1* 10.1* 10.0*  HCT 31.9* 25.5* 30.8* 29.8* 30.8*  MCV 95.8 92.1 96.9 97.1 97.2  PLT 222 226 275 319 433   Basic Metabolic Panel: Recent Labs  Lab 02/09/18 0925 02/10/18 0322 02/11/18 0044 02/11/18 0727 02/11/18 1812 02/12/18 0413 02/13/18 0803 02/14/18 0353 02/15/18 0351  NA 135 135 131* 134* 135 134* 138 138 139  K 4.0 4.1 4.7 5.5* 3.7 2.9* 4.0 3.6 3.1*  CL 96* 94* 90* 94* 98* 102 107 103 101  CO2 28 32 29 26 28 25 25 25 28   GLUCOSE 116* 125* 238* 80 164* 125* 98 72 72  BUN 8 8 11 12 9 9 8 6  <5*  CREATININE 0.44* 0.42* 0.69 0.62 0.61 0.47* 0.52* 0.47* 0.49*  CALCIUM 8.4* 8.4* 8.4* 8.0* 7.3* 7.1* 7.7* 7.9* 7.9*  MG 1.2* 1.6* 1.6* 1.9  --   --  1.4*  --   --   PHOS  --   --  5.4* 2.7  --   --  3.0  2.4*  --    GFR: Estimated Creatinine Clearance: 66 mL/min (A) (by C-G formula based on SCr of 0.49 mg/dL (L)). Liver Function Tests: Recent Labs  Lab 02/09/18 0925 02/10/18 0322 02/11/18 0727 02/12/18 0413 02/14/18 0353  AST 121* 92* 85* 48*  --   ALT 86* 73* 55 47  --   ALKPHOS 59 60 52 48  --   BILITOT 0.5 0.5 1.5* 0.6  --   PROT 5.6* 5.5* 5.4* 4.9*  --   ALBUMIN 2.4* 2.3* 2.4* 1.9* 2.1*   No results for input(s): LIPASE, AMYLASE in the last 168 hours. No results for input(s): AMMONIA in the last 168 hours. Coagulation Profile: No results for input(s): INR, PROTIME in the last 168 hours. Cardiac Enzymes: Recent Labs  Lab 02/11/18 0044 02/11/18 0122 02/11/18 0727 02/11/18 1246 02/12/18 0413  CKTOTAL  --  521*  --   --  263  CKMB  --   --   --   --  3.3  TROPONINI 0.13*  --  0.07* 0.05*  --    BNP (last 3 results) No results for input(s): PROBNP in the last 8760 hours. HbA1C: No results  for input(s): HGBA1C in the last 72 hours. CBG: Recent Labs  Lab 02/14/18 1951 02/15/18 0004 02/15/18 0440 02/15/18 0758 02/15/18 1201  GLUCAP 84 94 78 71 77   Lipid Profile: No results for input(s): CHOL, HDL, LDLCALC, TRIG, CHOLHDL, LDLDIRECT in the last 72 hours. Thyroid Function Tests: No results for input(s): TSH, T4TOTAL, FREET4, T3FREE, THYROIDAB in the last 72 hours. Anemia Panel: No results for input(s): VITAMINB12, FOLATE, FERRITIN, TIBC, IRON, RETICCTPCT in the last 72 hours. Urine analysis:    Component Value Date/Time   COLORURINE AMBER (A) 02/06/2018 1616   APPEARANCEUR HAZY (A) 02/06/2018 1616   LABSPEC 1.018 02/06/2018 1616   PHURINE 5.0 02/06/2018 1616   GLUCOSEU NEGATIVE 02/06/2018 1616   HGBUR MODERATE (A) 02/06/2018 1616   BILIRUBINUR NEGATIVE 02/06/2018 1616   KETONESUR NEGATIVE 02/06/2018 1616   PROTEINUR 30 (A) 02/06/2018 1616   UROBILINOGEN 0.2 08/31/2014 2350   NITRITE NEGATIVE 02/06/2018 1616   LEUKOCYTESUR NEGATIVE 02/06/2018 1616      Domenic Polite M.D. [facility] 02/15/2018, 2:58 PM  Page via [email or web address] - password [email or web address]  Call night coverage person after 7pm

## 2018-02-15 NOTE — Progress Notes (Signed)
Physical Therapy Treatment Patient Details Name: Russell Williamson MRN: [id number] DOB: 08/13/1956 Today's Date: 02/15/2018    History of Present Illness Pt is a 62 y.o. male admitted 02/06/18 post-fall with fever and cough; CXR showed pneumonia. Worked up for CAP. Head CT shows no acute intracranial abnormality. PMH includes throat CA (s/p radiation 2012), OA, cervical adenopathy, chronic LBP, chronic pancreatitis, COPD, HTN, h/o homelessness. On the morning of 5/14, patient had a rapid response with respiratory distress, failed with NRB mask at 15 L, was placed on BiPAP, BiPaP Discontinued 5/14. On 2L [place].    PT Comments    Patient progressing with ambulation distance and bed mobility this session.  Remains weak and at high risk for falls without assist.  Maintain recommendations for SNF level rehab at d/c.  PT to follow.   Follow Up Recommendations  SNF     Equipment Recommendations  Other (comment)(TBA)    Recommendations for Other Services       Precautions / Restrictions Precautions Precautions: Fall Precaution Comments: Watch SpO2    Mobility  Bed Mobility Overal bed mobility: Needs Assistance Bed Mobility: Supine to Sit     Supine to sit: Min guard;HOB elevated     General bed mobility comments: for safety, lines, increased time  Transfers Overall transfer level: Needs assistance Equipment used: Rolling walker (2 wheeled) Transfers: Sit to/from Stand Sit to Stand: Min assist         General transfer comment: for balance  Ambulation/Gait Ambulation/Gait assistance: Min assist Ambulation Distance (Feet): 120 Feet Assistive device: Rolling walker (2 wheeled) Gait Pattern/deviations: Decreased stride length;Step-to pattern;Step-through pattern;Narrow base of support     General Gait Details: reports leg length discrepancy on L   Stairs             Wheelchair Mobility    Modified Rankin (Stroke Patients Only)       Balance Overall balance  assessment: Needs assistance Sitting-balance support: Feet supported Sitting balance-Leahy Scale: Fair       Standing balance-Leahy Scale: Poor Standing balance comment: requires UE support                            Cognition Arousal/Alertness: Awake/alert Behavior During Therapy: WFL for tasks assessed/performed Overall Cognitive Status: No family/caregiver present to determine baseline cognitive functioning                                        Exercises      General Comments General comments (skin integrity, edema, etc.): patient eating lunch upon entry and no staff in room, alerted RN who sent in NT as pt on D1 diet & honey thick liquids with full supervision      Pertinent Vitals/Pain Faces Pain Scale: Hurts little more Pain Location: abomen prior to ambulation, chest after amb Pain Descriptors / Indicators: Discomfort;Grimacing Pain Intervention(s): Monitored during session;Patient requesting pain meds-RN notified;Repositioned    Home Living                      Prior Function            PT Goals (current goals can now be found in the care plan section) Progress towards PT goals: Progressing toward goals    Frequency    Min 3X/week      PT Plan Current plan remains appropriate  Co-evaluation              AM-PAC PT "6 Clicks" Daily Activity  Outcome Measure  Difficulty turning over in bed (including adjusting bedclothes, sheets and blankets)?: A Little Difficulty moving from lying on back to sitting on the side of the bed? : A Lot Difficulty sitting down on and standing up from a chair with arms (e.g., wheelchair, bedside commode, etc,.)?: Unable Help needed moving to and from a bed to chair (including a wheelchair)?: A Little Help needed walking in hospital room?: A Little Help needed climbing 3-5 steps with a railing? : Total 6 Click Score: 13    End of Session Equipment Utilized During Treatment: Gait  belt;Oxygen Activity Tolerance: Patient tolerated treatment well Patient left: with call bell/phone within reach;in chair;with chair alarm set   PT Visit Diagnosis: Other abnormalities of gait and mobility (R26.89)     Time: 1532-1600 PT Time Calculation (min) (ACUTE ONLY): 28 min  Charges:  $Gait Training: 8-22 mins $Therapeutic Activity: 8-22 mins                    G CodesMagda Kiel, [place] [phone] 02/15/2018    Reginia Naas 02/15/2018, 5:28 PM

## 2018-02-15 NOTE — Progress Notes (Signed)
Modified Barium Swallow Progress Note  Patient Details  Name: Russell Williamson MRN: [id number] Date of Birth: [birth year]  Today's Date: 02/15/2018  Modified Barium Swallow completed.  Full report located under Chart Review in the Imaging Section.  Brief recommendations include the following:  Clinical Impression  Pt's pharyngeal dysphagia has worsened since initial MBS 5/8, likely due to further loss of functional reserve as pt is globally more deconditioned. He now has an increased amount of residue that remains in the valleculae, posterior pharyngeal wall, and pyriform sinuses, which he penetrates after the swallow even with purees - which did not enter his airway on the last study. Pt cannot completely expel penetrates of nectar thick liquids or large boluses of honey thick liquids. SLP provided Min cues for effortful swallows and immediate coughs to better protect the airway and reduce residue with purees and honey thick liquids in small boluses. Recommend Dys 1 diet and honey thick liquids by spoon only, using multiple hard swallows and coughs per bolus. This will likely be an effortful means of getting in nutrition and he remains at risk for dehyrdation/malnutrition. Pt has a chronic risk for aspiration that can be reduced but not eliminated. Continue to recommend additional conversations about [place] with MD.    Ricka Burdock Evaluation Recommendations       SLP Diet Recommendations: Dysphagia 1 (Puree) solids;Honey thick liquids   Liquid Administration via: Spoon   Medication Administration: Crushed with puree   Supervision: Patient able to self feed;Full supervision/cueing for compensatory strategies   Compensations: Slow rate;Small sips/bites;Hard cough after swallow;Multiple dry swallows after each bite/sip   Postural Changes: Seated upright at 90 degrees;Remain semi-upright after after feeds/meals (Comment)   Oral Care Recommendations: Oral care QID   Other Recommendations: Have  oral suction available;Order thickener from pharmacy;Prohibited food (jello, ice cream, thin soups);Remove water pitcher    [name] 02/15/2018,2:36 PM   [name], M.A. CCC-SLP [phone]

## 2018-02-15 NOTE — Progress Notes (Signed)
Informed Dr. Broadus John that patient is requesting pain medication and nausea medication after working with PT but neither is due. Per MD can change Oxycodone 5 mg tablet PO to every 6 hours instead of every 4.

## 2018-02-15 NOTE — Progress Notes (Signed)
Patient requested pain medications throughout the night, but each time this RN went to assess the patient's pain score he would be asleep. Patient is currently sleeping now. Will continue to monitor and treat per MD orders.

## 2018-02-15 NOTE — Clinical Social Work Note (Signed)
Clinical Social Work Assessment  Patient Details  Name: Russell Williamson MRN: [id number] Date of Birth: [birth year]  Date of referral:  02/15/18               Reason for consult:  Facility Placement                Permission sought to share information with:  Facility Art therapist granted to share information::  Yes, Verbal Permission Granted  Name::        Agency::  SNFs  Relationship::     Contact Information:     Housing/Transportation Living arrangements for the past 2 months:  Single Family Home Source of Information:  Patient Patient Interpreter Needed:  None Criminal Activity/Legal Involvement Pertinent to Current Situation/Hospitalization:  No - Comment as needed Significant Relationships:  Other Family Members Lives with:  Relatives Do you feel safe going back to the place where you live?  No Need for family participation in patient care:  No (Coment)  Care giving concerns:  CSW received consult for possible SNF placement at time of discharge. CSW spoke with patient regarding PT recommendation of SNF placement at time of discharge. Patient reported that patient's niece is currently unable to care for patient at their home given patient's current physical needs and fall risk. Patient expressed understanding of PT recommendation and is agreeable to SNF placement at time of discharge. CSW to continue to follow and assist with discharge planning needs.   Social Worker assessment / plan:  CSW spoke with patient concerning possibility of rehab at [facility] before returning home.  Employment status:  Disabled (Comment on whether or not currently receiving Disability) Insurance information:  Medicaid [place] PT Recommendations:  [place] / Referral to community resources:  [place]  Patient/Family's Response to care:  Patient recognizes need for rehab before returning home and is agreeable to a SNF in [place].  Patient reported understanding that he will need to stay 30 days and may have to sign over his Medicaid check.   Patient/Family's Understanding of and Emotional Response to Diagnosis, Current Treatment, and Prognosis:  Patient/family is realistic regarding therapy needs and expressed being hopeful for SNF placement. Patient expressed understanding of CSW role and discharge process as well as medical condition. No questions/concerns about plan or treatment.    Emotional Assessment Appearance:  Appears older than stated age Attitude/Demeanor/Rapport:  Gracious Affect (typically observed):  Accepting, Appropriate Orientation:  Oriented to Self, Oriented to Place, Oriented to  Time, Oriented to Situation Alcohol / Substance use:  Not Applicable Psych involvement (Current and /or in the community):  No (Comment)  Discharge Needs  Concerns to be addressed:  Care Coordination Readmission within the last 30 days:  No Current discharge risk:  Dependent with Mobility Barriers to Discharge:  Continued Medical Work up   [employer], [place] 02/15/2018, 11:02 AM

## 2018-02-15 NOTE — NC FL2 (Signed)
[place] MEDICAID FL2 LEVEL OF CARE SCREENING TOOL     IDENTIFICATION  Patient Name: Russell Williamson Birthdate: 24-Apr-1956 Sex: male Admission Date (Current Location): 02/06/2018  [facility] and [place] Number:  Herbalist and Address:  [facility] [place] [place] [place] [address], [place], [place] [address]      Provider Number: [id number]  Attending Physician Name and Address:  Domenic Polite, MD  Relative Name and Phone Number:  Earlie Server, sister, [phone]    Current Level of Care: Hospital Recommended Level of Care: [place] Prior Approval Number:    Date Approved/Denied:   PASRR Number: [id number]  Discharge Plan: SNF    Current Diagnoses: Patient Active Problem List   Diagnosis Date Noted  . Acute respiratory failure with hypoxia ([place]) 02/14/2018  . Glasgow coma scale total score 3-8 ([place])   . Respiratory failure ([place])   . Sepsis ([place])   . Pressure injury of skin 02/08/2018  . COPD with chronic bronchitis ([place]) 02/06/2018  . CAP (community acquired pneumonia) 02/06/2018  . Syncope 02/06/2018  . Hyponatremia 02/06/2018  . Dehydration 02/06/2018  . Fall at home, initial encounter 02/06/2018  . Tobacco abuse 02/06/2018  . Protein-calorie malnutrition, severe 07/03/2017  . Abnormal EKG 07/02/2017  . Abdominal pain, epigastric   . COPD exacerbation ([place]) 01/27/2017  . Oral thrush 01/27/2017  . 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 Encounter for intubation 05/07/2016  . Port catheter in place 05/05/2016  . Poor venous access 04/28/2016  . Deficiency anemia 04/15/2016  . Chronic leukopenia 04/19/2015  . Anemia in chronic illness 04/19/2015  . Weight loss, unintentional 04/19/2015  . Relapsing chronic pancreatitis ([place]) 01/17/2015  . Diarrhea 09/04/2014  . 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 Malnutrition of moderate degree ([place]) 09/03/2014  . Hypokalemia 09/02/2014  . Chronic alcoholic pancreatitis ([place]) 09/01/2014  . Mucositis  06/17/2014  . Chronic neck pain 06/17/2014  . S/P gastrostomy ([place]) 06/17/2014  . Nicotine abuse 02/28/2014  . Pancytopenia ([place]) 02/28/2014  . Hypothyroidism (acquired) 11/02/2013  . Hypothyroidism 10/29/2013  . 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 Cervical adenopathy 04/26/2012  . Hiatal hernia   . Hx of radiation therapy   . HTN (hypertension) 10/16/2011  . History of cancer tonsil 08/20/2011    Orientation RESPIRATION BLADDER Height & Weight     Self, Time, Situation, Place  O2(Nasal cannula 2L) Continent, External catheter Weight: 48.1 kg (106 lb 0.7 oz) Height:  5\' 9"  (175.3 cm)  BEHAVIORAL SYMPTOMS/MOOD NEUROLOGICAL BOWEL NUTRITION STATUS      Continent Diet(Please see DC Summary)  AMBULATORY STATUS COMMUNICATION OF NEEDS Skin   Extensive Assist Verbally PU Stage and Appropriate Care(Stage II on sacrum)                       Personal Care Assistance Level of Assistance  Bathing, Feeding, Dressing Bathing Assistance: Maximum assistance Feeding assistance: Limited assistance Dressing Assistance: Limited assistance     Functional Limitations Info  Sight, Hearing, Speech Sight Info: Adequate Hearing Info: Adequate Speech Info: Adequate    SPECIAL CARE FACTORS FREQUENCY  PT (By licensed PT), OT (By licensed OT)     PT Frequency: 5x/week OT Frequency: 3x/week            Contractures      Additional Factors Info  Code Status, Allergies, Isolation Precautions Code Status Info: Full Allergies Info: NKA     Isolation Precautions Info: MRSA     Current Medications (02/15/2018):  This is the  current hospital active medication list Current Facility-Administered Medications  Medication Dose Route Frequency Provider Last Rate Last Dose  . 0.9 %  sodium chloride infusion   Intravenous Continuous Bodenheimer, Charles A, NP 10 mL/hr at 02/12/18 0600    . chlorhexidine (PERIDEX) 0.12 % solution 15 mL  15 mL Mouth Rinse BID Brand Males, MD   15 mL at 02/15/18 0949  .  enoxaparin (LOVENOX) injection 40 mg  40 mg Subcutaneous Daily Rai, Ripudeep K, MD   40 mg at 02/15/18 0948  . folic acid (FOLVITE) tablet 1 mg  1 mg Per Tube Daily Omar Person, NP   1 mg at 02/12/18 0905  . furosemide (LASIX) injection 40 mg  40 mg Intravenous Q12H Domenic Polite, MD      . ipratropium-albuterol (DUONEB) 0.5-2.5 (3) MG/3ML nebulizer solution 3 mL  3 mL Nebulization TID Brand Males, MD   3 mL at 02/15/18 0846  . levalbuterol (XOPENEX) nebulizer solution 0.63 mg  0.63 mg Nebulization Q6H PRN Doutova, Anastassia, MD      . lipase/protease/amylase (CREON) capsule 12,000 Units  12,000 Units Oral QID Parrett, Tammy S, NP   12,000 Units at 02/13/18 2149  . MEDLINE mouth rinse  15 mL Mouth Rinse BID Brand Males, MD   15 mL at 02/15/18 0949  . ondansetron (ZOFRAN) tablet 4 mg  4 mg Oral Q6H PRN Toy Baker, MD   4 mg at 02/10/18 2129   Or  . ondansetron (ZOFRAN) injection 4 mg  4 mg Intravenous Q6H PRN Toy Baker, MD   4 mg at 02/13/18 0136  . oxyCODONE (Oxy IR/ROXICODONE) immediate release tablet 5 mg  5 mg Oral Q8H PRN Jennelle Human B, NP   5 mg at 02/14/18 0318  . [START ON 02/16/2018] pantoprazole sodium (PROTONIX) 40 mg/20 mL oral suspension 40 mg  40 mg Oral Daily Domenic Polite, MD      . polyethylene glycol (MIRALAX / GLYCOLAX) packet 17 g  17 g Oral Daily PRN Toy Baker, MD   17 g at 02/12/18 1208  . potassium chloride SA (K-DUR,KLOR-CON) CR tablet 40 mEq  40 mEq Oral Mila Homer, MD      . sodium chloride 0.9 % bolus 250 mL  250 mL Intravenous Once PRN Bodenheimer, Charles A, NP      . thiamine (VITAMIN B-1) tablet 100 mg  100 mg Per Tube Daily Omar Person, NP   100 mg at 02/12/18 0905  . vancomycin (VANCOCIN) 500 mg in sodium chloride 0.9 % 100 mL IVPB  500 mg Intravenous Q8H Leodis Sias, [facility]   Stopped at 02/15/18 1052     Discharge Medications: Please see discharge summary for a list of discharge  medications.  Relevant Imaging Results:  Relevant Lab Results:   Additional Information SSN: [place], [place]

## 2018-02-16 LAB — BASIC METABOLIC PANEL
Anion gap: 9 (ref 5–15)
CHLORIDE: 100 mmol/L — AB (ref 101–111)
CO2: 31 mmol/L (ref 22–32)
CREATININE: 0.5 mg/dL — AB (ref 0.61–1.24)
Calcium: 8 mg/dL — ABNORMAL LOW (ref 8.9–10.3)
GFR calc Af Amer: >60 mL/min (ref >60)
GFR calc non Af Amer: >60 mL/min (ref >60)
Glucose, Bld: 90 mg/dL (ref 65–99)
Potassium: 3.2 mmol/L — ABNORMAL LOW (ref 3.5–5.1)
Sodium: 140 mmol/L (ref 135–145)

## 2018-02-16 LAB — VANCOMYCIN, TROUGH: VANCOMYCIN TR: 10 ug/mL — AB (ref 15–20)

## 2018-02-16 LAB — GLUCOSE, CAPILLARY
Glucose-Capillary: 116 mg/dL — ABNORMAL HIGH (ref 65–99)
Glucose-Capillary: 125 mg/dL — ABNORMAL HIGH (ref 65–99)
Glucose-Capillary: 137 mg/dL — ABNORMAL HIGH (ref 65–99)
Glucose-Capillary: 93 mg/dL (ref 65–99)
Glucose-Capillary: 95 mg/dL (ref 65–99)
Glucose-Capillary: 96 mg/dL (ref 65–99)
Glucose-Capillary: 98 mg/dL (ref 65–99)

## 2018-02-16 LAB — CBC
HCT: 30.6 % — ABNORMAL LOW (ref 39.0–52.0)
Hemoglobin: 10.3 g/dL — ABNORMAL LOW (ref 13.0–17.0)
MCH: 32.5 pg (ref 26.0–34.0)
MCHC: 33.7 g/dL (ref 30.0–36.0)
MCV: 96.5 fL (ref 78.0–100.0)
PLATELETS: 322 10*3/uL (ref 150–400)
RBC: 3.17 MIL/uL — ABNORMAL LOW (ref 4.22–5.81)
RDW: 17.2 % — AB (ref 11.5–15.5)
WBC: 4 10*3/uL (ref 4.0–10.5)

## 2018-02-16 MED ORDER — POTASSIUM CHLORIDE CRYS ER 20 MEQ PO TBCR
40.0000 meq | EXTENDED_RELEASE_TABLET | ORAL | Status: AC
  Administered 2018-02-16 (×3): 40 meq via ORAL
  Filled 2018-02-16 (×2): qty 2

## 2018-02-16 MED ORDER — VANCOMYCIN HCL IN DEXTROSE 750-5 MG/150ML-% IV SOLN
750.0000 mg | Freq: Three times a day (TID) | INTRAVENOUS | Status: DC
  Administered 2018-02-16 – 2018-02-17 (×3): 750 mg via INTRAVENOUS
  Filled 2018-02-16 (×5): qty 150

## 2018-02-16 MED ORDER — ENSURE ENLIVE PO LIQD
237.0000 mL | Freq: Three times a day (TID) | ORAL | Status: DC
  Administered 2018-02-16 – 2018-02-18 (×7): 237 mL via ORAL

## 2018-02-16 NOTE — Progress Notes (Signed)
CSW updated patient that [place] is able to accept him at discharge. Patient stated that he needs to discuss it with his niece because he may not be able to sign his check over to the SNF. He states he will be safe if he goes home, but will let CSW know tomorrow.   Russell Williamson Russell Batterson LCSW [phone]

## 2018-02-16 NOTE — Progress Notes (Signed)
[facility]                                                                              Patient Demographics  Russell Williamson, is a 62 y.o. male, DOB - 1956/06/07, FSE:[id number]  Admit date - 02/06/2018   Admitting Physician Toy Baker, MD  Outpatient Primary MD for the patient is Harvie Junior, MD LOS - 10  days   Chief Complaint  Patient presents with  . Fall       Brief summary   Patient is a 62 year old male withCOPD, GERD, Tonsillar Cancer s/p Radiation, Dysphagia, Polysubstance Abuse, ETOH, HTN scented to ED on 5/6 with fever and coughing.  Patient was found to have MRSA PNA. Plans were to be discharged on 5/10, however, per nursing staff patient left unit for a couple of hours and then came back stating that he had no ride and couldn't leave. Nurse reported giving patient medications at 2130 included Oxy 15 mg and Remeron). At midnight patient was found to be hypoxic and non-responsive. Given 0.4 mg Narcan without improvement. Intubated and transferred to ICU.  Patient was transferred to stepdown and care was transferred to tried hospitalist, assumed care on 5/14.  On the morning of 5/14, patient had a rapid response with respiratory distress, failed with NRB mask at 15 L, was placed on BiPAP, Lasix 60 mg IV x1 given.  Assessment & Plan    Principal Problem:   Acute respiratory failure with hypoxia (HCC) with COPD, MRSA pneumonia, aspiration event -patient was intubated on 5/10 for acute respiratory failure secondary toMRSA pneumonia and possible aspiration event from oversedation -Extubated on 5/12, transfered to [facility] service 5/14, continue vancomycin to complete a seven-day course for MRSA pneumonia, day 6 now -5/15 became hypoxic and hypercarbic secondary to volume overload likely from fluid resuscitation -off BiPAP, continue Lasix -40 mg every 12, improving with diuresis -chest x-ray 5/14 with small to moderate right and small left pleural  effusions, bibasilar opacities -Echocardiogram shows preserved ejection fraction and normal wall motion  -monitor urine output, B met, wean O2 -ambulate, out of bed -Discharge planning  Tonsillar CA status post radiation -Long history of dysphagia now likely worsened following intubation -SLP following,  modified barium swallow  5/15 continues to remain a chronic risk for aspiration, dysphagia 1 diet with honey thick liquids recommended, meds with pure -repeat CT neck this admission shows no evidence of recurrence -Follow-up with oncology  Hypotension: Patient has a history of hypertension -Required pressor support on 5/11, currently resolved, 2D echo 9/18 showed EF of 65% no wall motion abnormalities  Acute metabolic encephalopathy with a history of alcohol, polysubstance abuse. - UDS on arrival positive for opiates, cocaine and benzodiazepines.  Patient was placed on Precedex drip, fentanyl drip in ICU, currently off. -counseled -mentation improved back to baseline    Malnutrition of moderate degree (HCC) -Continue nutritional supplements    Anemia in chronic illness -H&H currently stable, 10.1  Nicotine abuse Placed on nicotine patch   Code Status: Full code DVT Prophylaxis:  Lovenox  Family Communication: no family at bedside  Disposition Plan: SNF in 1-2days  Time Spent  in minutes   35 minutes  Procedures:  Intubation extubation while in ICU  Consultants:   Pulmonology  Antimicrobials:    IV Zithromax IV Rocephin  Medications  Scheduled Meds: . chlorhexidine  15 mL Mouth Rinse BID  . enoxaparin (LOVENOX) injection  40 mg Subcutaneous Daily  . folic acid  1 mg Per Tube Daily  . furosemide  40 mg Intravenous Q12H  . ipratropium-albuterol  3 mL Nebulization TID  . lipase/protease/amylase  12,000 Units Oral QID  . mouth rinse  15 mL Mouth Rinse BID  . pantoprazole sodium  40 mg Oral Daily  . potassium chloride  40 mEq Oral Q2H  . thiamine  100 mg Per  Tube Daily   Continuous Infusions: . sodium chloride 10 mL/hr at 02/12/18 0600  . sodium chloride    . vancomycin Stopped (02/16/18 1150)   PRN Meds:.levalbuterol, ondansetron **OR** ondansetron (ZOFRAN) IV, oxyCODONE, polyethylene glycol, sodium chloride   Antibiotics   Anti-infectives (From admission, onward)   Start     Dose/Rate Route Frequency Ordered Stop   02/15/18 0200  vancomycin (VANCOCIN) 500 mg in sodium chloride 0.9 % 100 mL IVPB     500 mg 100 mL/hr over 60 Minutes Intravenous Every 8 hours 02/14/18 1903     02/13/18 1200  cefTRIAXone (ROCEPHIN) 1 g in sodium chloride 0.9 % 100 mL IVPB  Status:  Discontinued     1 g 200 mL/hr over 30 Minutes Intravenous Every 24 hours 02/13/18 0932 02/15/18 1012   02/12/18 1800  vancomycin (VANCOCIN) 500 mg in sodium chloride 0.9 % 100 mL IVPB  Status:  Discontinued     500 mg 100 mL/hr over 60 Minutes Intravenous Every 12 hours 02/12/18 1238 02/14/18 1903   02/11/18 0245  vancomycin (VANCOCIN) IVPB 1000 mg/200 mL premix  Status:  Discontinued     1,000 mg 200 mL/hr over 60 Minutes Intravenous Every 24 hours 02/11/18 0241 02/12/18 1238   02/11/18 0245  piperacillin-tazobactam (ZOSYN) IVPB 3.375 g  Status:  Discontinued     3.375 g 12.5 mL/hr over 240 Minutes Intravenous Every 8 hours 02/11/18 0241 02/13/18 0911   02/10/18 2200  sulfamethoxazole-trimethoprim (BACTRIM DS,SEPTRA DS) 800-160 MG per tablet 1 tablet  Status:  Discontinued     1 tablet Oral Every 12 hours 02/10/18 1901 02/11/18 0201   02/10/18 0000  sulfamethoxazole-trimethoprim (BACTRIM DS,SEPTRA DS) 800-160 MG tablet     1 tablet Oral 2 times daily 02/10/18 1438 02/17/18 2359   02/07/18 1800  azithromycin (ZITHROMAX) 500 mg in sodium chloride 0.9 % 250 mL IVPB  Status:  Discontinued     500 mg 250 mL/hr over 60 Minutes Intravenous Every 24 hours 02/06/18 2310 02/07/18 0919   02/07/18 1800  cefTRIAXone (ROCEPHIN) 1 g in sodium chloride 0.9 % 100 mL IVPB  Status:   Discontinued     1 g 200 mL/hr over 30 Minutes Intravenous Every 24 hours 02/06/18 2310 02/10/18 1901   02/07/18 1800  azithromycin (ZITHROMAX) tablet 500 mg     500 mg Oral Daily 02/07/18 0919 02/10/18 1748   02/06/18 1800  cefTRIAXone (ROCEPHIN) 1 g in sodium chloride 0.9 % 100 mL IVPB     1 g 200 mL/hr over 30 Minutes Intravenous  Once 02/06/18 1758 02/06/18 2046   02/06/18 1800  azithromycin (ZITHROMAX) 500 mg in sodium chloride 0.9 % 250 mL IVPB     500 mg 250 mL/hr over 60 Minutes Intravenous  Once 02/06/18 1758 02/06/18 2226  Subjective:  -Doing better, undecided about rehabilitation -Breathing continues to improve, tolerating diet  Objective:   Vitals:   02/16/18 0737 02/16/18 0750 02/16/18 0801 02/16/18 1220  BP: 127/85  127/85   Pulse: 65  65   Resp: 12  14   Temp: 98.5 F (36.9 C) 98.3 F (36.8 C)  98.5 F (36.9 C)  TempSrc: Oral   Oral  SpO2: 99%  99%   Weight:      Height:        Intake/Output Summary (Last 24 hours) at 02/16/2018 1228 Last data filed at 02/16/2018 0945 Gross per 24 hour  Intake 671 ml  Output 2725 ml  Net -2054 ml     Wt Readings from Last 3 Encounters:  02/16/18 46.7 kg (102 lb 15.3 oz)  07/02/17 42.9 kg (94 lb 9.6 oz)  04/13/17 43.1 kg (95 lb)     Exam Gen: Awake, Alert, Oriented X 3, frail cachectic African-American male HEENT: positive JVD, right IJ triple-lumen Lungs: findings slight crackles CVS: RRR,No Gallops,Rubs or new Murmurs Abd: soft, Non tender, non distended, BS present Extremities: 2+ upper extremity edema, trace in lower legs Skin: no new rashes  Data Reviewed:  I have personally reviewed following labs and imaging studies  Micro Results Recent Results (from the past 240 hour(s))  Blood Culture (routine x 2)     Status: None   Collection Time: 02/06/18  6:02 PM  Result Value Ref Range Status   Specimen Description BLOOD RIGHT ANTECUBITAL  Final   Special Requests   Final    BOTTLES DRAWN AEROBIC  AND ANAEROBIC Blood Culture adequate volume   Culture   Final    NO GROWTH 5 DAYS Performed at [facility], [address] [place], [place] [address]    Report Status 02/11/2018 FINAL  Final  Blood Culture (routine x 2)     Status: None   Collection Time: 02/06/18  6:30 PM  Result Value Ref Range Status   Specimen Description BLOOD LEFT ANTECUBITAL  Final   Special Requests   Final    BOTTLES DRAWN AEROBIC AND ANAEROBIC Blood Culture adequate volume   Culture   Final    NO GROWTH 5 DAYS Performed at [facility], [place] [address], [place], [place] [address]    Report Status 02/11/2018 FINAL  Final  MRSA PCR Screening     Status: Abnormal   Collection Time: 02/07/18  2:26 AM  Result Value Ref Range Status   MRSA by PCR POSITIVE (A) NEGATIVE Final    Comment:        The GeneXpert MRSA Assay (FDA approved for NASAL specimens only), is one component of a comprehensive MRSA colonization surveillance program. It is not intended to diagnose MRSA infection nor to guide or monitor treatment for MRSA infections. RESULT CALLED TO, READ BACK BY AND VERIFIED WITH: Alyssa Grove RN 02/07/18 0631 JDW Performed at [facility], [address] [place], [place] [address]   Culture, sputum-assessment     Status: None   Collection Time: 02/07/18  5:42 PM  Result Value Ref Range Status   Specimen Description EXPECTORATED SPUTUM  Final   Special Requests Normal  Final   Sputum evaluation   Final    THIS SPECIMEN IS ACCEPTABLE FOR SPUTUM CULTURE Performed at [facility], [address] [place], [place] [address]    Report Status 02/08/2018 FINAL  Final  Culture, respiratory (NON-Expectorated)     Status:  None   Collection Time: 02/07/18  5:42 PM  Result Value Ref Range Status   Specimen Description EXPECTORATED SPUTUM  Final   Special Requests Normal Reflexed from [id number]  Final   Gram Stain   Final    ABUNDANT WBC PRESENT,BOTH PMN AND MONONUCLEAR MODERATE GRAM  POSITIVE COCCI RARE YEAST FEW SQUAMOUS EPITHELIAL CELLS PRESENT Performed at [facility], [place] [address], [place], [place] [address]    Culture   Final    MODERATE METHICILLIN RESISTANT STAPHYLOCOCCUS AUREUS   Report Status 02/10/2018 FINAL  Final   Organism ID, Bacteria METHICILLIN RESISTANT STAPHYLOCOCCUS AUREUS  Final      Susceptibility   Methicillin resistant staphylococcus aureus - MIC*    CIPROFLOXACIN >=8 RESISTANT Resistant     ERYTHROMYCIN >=8 RESISTANT Resistant     GENTAMICIN <=0.5 SENSITIVE Sensitive     OXACILLIN >=4 RESISTANT Resistant     TETRACYCLINE <=1 SENSITIVE Sensitive     VANCOMYCIN <=0.5 SENSITIVE Sensitive     TRIMETH/SULFA <=10 SENSITIVE Sensitive     CLINDAMYCIN <=0.25 SENSITIVE Sensitive     RIFAMPIN <=0.5 SENSITIVE Sensitive     Inducible Clindamycin NEGATIVE Sensitive     * MODERATE METHICILLIN RESISTANT STAPHYLOCOCCUS AUREUS    Radiology Reports Dg Chest 2 View  Result Date: 02/06/2018 CLINICAL DATA:  Hypoxia, history of hypertension, COPD is seizures. Tobacco use since age 77. EXAM: CHEST - 2 VIEW COMPARISON:  07/01/2017 FINDINGS: Emphysematous hyperinflation of the lungs with new right small to moderate pleural effusion. Reverse cephalization and engorgement of pulmonary vasculature to the lower lobes likely on the basis of COPD. Slightly more confluent airspace opacity at the right lung base cannot exclude the possibility of pneumonia. Heart and mediastinal contours are within normal limits. Remote right-sided sixth rib fracture. IMPRESSION: COPD with new small to moderate sized right pleural effusion with reversed cephalization of pulmonary vasculature to the lower lobes likely on the basis of upper lobe COPD. More confluent opacities at the right lung base raise concern for right lower lobe pneumonia and/or atelectasis. Electronically Signed   By: Ashley Royalty M.D.   On: 02/06/2018 17:53   Ct Head Wo Contrast  Result Date: 02/06/2018 CLINICAL  DATA:  [place] this afternoon striking head, loss of consciousness, history of tonsillar cancer, COPD, GERD, hypertension, smoker EXAM: CT HEAD WITHOUT CONTRAST CT CERVICAL SPINE WITHOUT CONTRAST TECHNIQUE: Multidetector CT imaging of the head and cervical spine was performed following the standard protocol without intravenous contrast. Multiplanar CT image reconstructions of the cervical spine were also generated. COMPARISON:  CT soft tissue neck 04/23/2016 FINDINGS: CT HEAD FINDINGS Brain: Normal ventricular morphology. No midline shift or mass effect. Normal appearance of brain parenchyma. No intracranial hemorrhage, mass lesion, or evidence of acute infarction. No extra-axial fluid collections. Vascular: Mild atherosclerotic calcification of internal carotid arteries bilaterally at skull base Skull: Calvaria intact Sinuses/Orbits: Mucosal thickening LEFT maxillary sinus. Remaining visualized paranasal sinuses clear Other: N/A CT CERVICAL SPINE FINDINGS Alignment: Normal Skull base and vertebrae: Osseous mineralization normal. Visualized skull base intact. Vertebral body and disc space heights maintained. No fracture, subluxation or bone destruction. Soft tissues and spinal canal: Prevertebral soft tissues are mildly prominent with fluid versus mucous in the hypopharynx. Scattered atherosclerotic calcifications of the carotid and vertebral arteries. Diffuse infiltration of soft tissue planes in the parapharyngeal region, which may be due to prior radiation therapy or tumor, suboptimally assessed by noncontrast exam, appears more pronounced than on priors contrast-enhanced CT soft tissue neck study. Narrowing  of airway at the larynx and supraglottic region. Suboptimal assessment for adenopathy due to loss of tissue planes. Disc levels: Broad-based disc herniation at C6-C7 indenting thecal sac. Upper chest: Lung apices appear emphysematous but clear. Other: N/A IMPRESSION: No acute intracranial abnormalities.1 No  acute cervical spine abnormalities. Emphysematous changes at lung apices. Fluid/mucous within the hypopharynx with narrowing of the airway at the level of the larynx and supraglottic region. Diffuse infiltration of soft tissue planes in the cervical region which may be related to prior radiation therapy for tonsillar cancer though recurrent tumor could cause a similar appearance; recommend follow-up dedicated CT soft tissue neck imaging with contrast when clinical condition permits to further assess. Electronically Signed   By: Lavonia Dana M.D.   On: 02/06/2018 17:35   Ct Soft Tissue Neck W Contrast  Result Date: 02/09/2018 CLINICAL DATA:  62 y/o M; history of tonsillar cancer post radiation therapy. EXAM: CT NECK WITH CONTRAST TECHNIQUE: Multidetector CT imaging of the neck was performed using the standard protocol following the bolus administration of intravenous contrast. CONTRAST:  115m OMNIPAQUE IOHEXOL 300 MG/ML  SOLN COMPARISON:  04/23/2016 CT of the neck. 02/06/2018 CT of cervical spine. FINDINGS: Pharynx and larynx: No nodular mucosal enhancement of the oropharynx or tonsils to suggest recurrent disease. Mild smooth low-attenuation mucosal thickening of the oropharynx compatible chronic post radiation changes. No new exophytic mass or abnormal nodular enhancement of the aerodigestive tract. Salivary glands: Left submandibular gland resection. No inflammation, mass, or stone of residual salivary glands. Thyroid: Normal. Lymph nodes: Left neck dissection. No mass within the resection bed identified. No lymphadenopathy by imaging criteria, lymph node necrosis, or abnormal enhancement. Vascular: Diffuse irregularity of the carotid systems multiple areas of mild less than 50% stenosis compatible with atherosclerotic and post radiation changes. 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 Limited intracranial: Negative. Visualized orbits: Negative. Mastoids and visualized paranasal sinuses:  Moderate left maxillary sinus mucosal thickening, small fluid level, and chronic inflammatory changes of the wall of the sinus. Paranasal sinuses and mastoid air cells are otherwise normally aerated. Skeleton: Mild cervical spondylosis. No high-grade bony canal stenosis. No lytic or blastic lesion. Upper chest: Moderate emphysema.  Small right pleural effusion. Other: None. IMPRESSION: 1. Stable post treatment changes in the neck. No evidence of recurrent disease. No lymphadenopathy. 2. Stable chronic findings as above. Electronically Signed   By: LKristine GarbeM.D.   On: 02/09/2018 18:39   Ct Angio Chest Pe W Or Wo Contrast  Result Date: 02/07/2018 CLINICAL DATA:  62year old male with shortness of breath. History of pancreatitis and throat cancer. Weight loss. EXAM: CT ANGIOGRAPHY CHEST CT ABDOMEN AND PELVIS WITH CONTRAST TECHNIQUE: Multidetector CT imaging of the chest was performed using the standard protocol during bolus administration of intravenous contrast. Multiplanar CT image reconstructions and MIPs were obtained to evaluate the vascular anatomy. Multidetector CT imaging of the abdomen and pelvis was performed using the standard protocol during bolus administration of intravenous contrast. CONTRAST:  846mISOVUE-370 IOPAMIDOL (ISOVUE-370) INJECTION 76% COMPARISON:  CT of the abdomen pelvis dated 07/11/2016 and radiograph dated 03/10/2017 FINDINGS: Evaluation is limited due to streak artifact caused by patient's arms and overlying metallic support device. Evaluation is also limited due to anasarca and cachexia. CTA CHEST FINDINGS Cardiovascular: There is mild cardiomegaly. No pericardial effusion. There is coronary vascular calcification primarily involving the LAD. The aorta is poorly visualized and suboptimally opacified but grossly unremarkable. Evaluation of the pulmonary arteries is limited due to suboptimal enhancement  of the peripheral branches. Apparent areas of decreased enhancement  in the periphery of the distal branches of the right lower lobe (series 9 images 186-191) likely artifactual and related to volume averaging artifact with adjacent bronchi as well as poor contrast opacification. No definite CT evidence of pulmonary embolism. Mediastinum/Nodes: There is fullness of the right hilum measuring 15 mm in short axis. Ill-defined soft tissue density extending from the hila along the bronchi are not well evaluated but likely thickened bronchial wall with endobronchial debris. Evaluation of the mediastinum and hila is very limited. No mediastinal fluid collection. The esophagus is not well visualized. Lungs/Pleura: There are extensive consolidative changes of the lung bases and lower lobes progressed compared to the prior CT. There is diffuse areas of nodularity is at the lung bases. Diffuse thickening of the bronchial walls with mucous impaction involving the lower lobes. There is mucous impaction of the right lower lumbar bronchus. There is narrowing of the central bronchi likely secondary to edema. There is a small to moderate right and trace left pleural effusion. There is a background of emphysema. Minimal amount of air in the left apical pleural surface noted, appears chronic and seen on the CT of the cervical spine dated 02/06/2018. No pneumothorax. Musculoskeletal: Diffuse soft tissue edema and anasarca. There is loss of subcutaneous fat and cachexia. No acute osseous pathology. Extensive right chest wall venous collaterals possibly related to a degree of SVC occlusion. Review of the MIP images confirms the above findings. CT ABDOMEN and PELVIS FINDINGS No intra-abdominal free air or free fluid. Hepatobiliary: The liver is grossly unremarkable. No intrahepatic biliary ductal dilatation. Cholecystectomy. Pancreas: Changes of chronic pancreatitis with dystrophic calcification and dilatation of the main pancreatic duct. Evaluation of the pancreas is limited due to suboptimal  visualization. Spleen: Normal in size without focal abnormality. Adrenals/Urinary Tract: The adrenal glands are unremarkable. Small right renal cyst. The kidneys, kidneys and urinary bladder appear unremarkable. Stomach/Bowel: Oral contrast mixed with stool noted throughout the colon. There is no bowel dilatation or evidence of obstruction. The appendix is not identified with certainty. Vascular/Lymphatic: Extensive aortoiliac atherosclerotic disease. No portal venous gas. Evaluation for adenopathy is limited due to diffuse edema and anasarca as well as paucity of abdominal fat. Reproductive: The prostate and seminal vesicles are grossly unremarkable. Other: Diffuse edema and anasarca as well as cachexia. Musculoskeletal: No acute osseous pathology.  Osteopenia. Review of the MIP images confirms the above findings. IMPRESSION: 1. No definite CT evidence of pulmonary embolism. 2. Diffuse thickening of the bronchial wall with endobronchial mucous content predominantly involving the lower lobes. Large areas of airspace opacity involving the lower lobes and lung bases bilaterally, right greater left most consistent with pneumonia possibly related to aspiration. Clinical correlation is recommended. Bilateral pleural effusions, right greater left. 3. No definite acute intra-abdominal or pelvic pathology. 4. Diffuse soft tissue edema and anasarca.  Cachexia. Electronically Signed   By: Anner Crete M.D.   On: 02/07/2018 02:40   Ct Cervical Spine Wo Contrast  Result Date: 02/06/2018 CLINICAL DATA:  [place] this afternoon striking head, loss of consciousness, history of tonsillar cancer, COPD, GERD, hypertension, smoker EXAM: CT HEAD WITHOUT CONTRAST CT CERVICAL SPINE WITHOUT CONTRAST TECHNIQUE: Multidetector CT imaging of the head and cervical spine was performed following the standard protocol without intravenous contrast. Multiplanar CT image reconstructions of the cervical spine were also generated. COMPARISON:  CT  soft tissue neck 04/23/2016 FINDINGS: CT HEAD FINDINGS Brain: Normal ventricular morphology. No midline shift or mass  effect. Normal appearance of brain parenchyma. No intracranial hemorrhage, mass lesion, or evidence of acute infarction. No extra-axial fluid collections. Vascular: Mild atherosclerotic calcification of internal carotid arteries bilaterally at skull base Skull: Calvaria intact Sinuses/Orbits: Mucosal thickening LEFT maxillary sinus. Remaining visualized paranasal sinuses clear Other: N/A CT CERVICAL SPINE FINDINGS Alignment: Normal Skull base and vertebrae: Osseous mineralization normal. Visualized skull base intact. Vertebral body and disc space heights maintained. No fracture, subluxation or bone destruction. Soft tissues and spinal canal: Prevertebral soft tissues are mildly prominent with fluid versus mucous in the hypopharynx. Scattered atherosclerotic calcifications of the carotid and vertebral arteries. Diffuse infiltration of soft tissue planes in the parapharyngeal region, which may be due to prior radiation therapy or tumor, suboptimally assessed by noncontrast exam, appears more pronounced than on priors contrast-enhanced CT soft tissue neck study. Narrowing of airway at the larynx and supraglottic region. Suboptimal assessment for adenopathy due to loss of tissue planes. Disc levels: Broad-based disc herniation at C6-C7 indenting thecal sac. Upper chest: Lung apices appear emphysematous but clear. Other: N/A IMPRESSION: No acute intracranial abnormalities.1 No acute cervical spine abnormalities. Emphysematous changes at lung apices. Fluid/mucous within the hypopharynx with narrowing of the airway at the level of the larynx and supraglottic region. Diffuse infiltration of soft tissue planes in the cervical region which may be related to prior radiation therapy for tonsillar cancer though recurrent tumor could cause a similar appearance; recommend follow-up dedicated CT soft tissue neck  imaging with contrast when clinical condition permits to further assess. Electronically Signed   By: Lavonia Dana M.D.   On: 02/06/2018 17:35   Ct Abdomen Pelvis W Contrast  Result Date: 02/07/2018 CLINICAL DATA:  62 year old male with shortness of breath. History of pancreatitis and throat cancer. Weight loss. EXAM: CT ANGIOGRAPHY CHEST CT ABDOMEN AND PELVIS WITH CONTRAST TECHNIQUE: Multidetector CT imaging of the chest was performed using the standard protocol during bolus administration of intravenous contrast. Multiplanar CT image reconstructions and MIPs were obtained to evaluate the vascular anatomy. Multidetector CT imaging of the abdomen and pelvis was performed using the standard protocol during bolus administration of intravenous contrast. CONTRAST:  40m ISOVUE-370 IOPAMIDOL ([id number]) INJECTION 76% COMPARISON:  CT of the abdomen pelvis dated 07/11/2016 and radiograph dated 03/10/2017 FINDINGS: Evaluation is limited due to streak artifact caused by patient's arms and overlying metallic support device. Evaluation is also limited due to anasarca and cachexia. CTA CHEST FINDINGS Cardiovascular: There is mild cardiomegaly. No pericardial effusion. There is coronary vascular calcification primarily involving the LAD. The aorta is poorly visualized and suboptimally opacified but grossly unremarkable. Evaluation of the pulmonary arteries is limited due to suboptimal enhancement of the peripheral branches. Apparent areas of decreased enhancement in the periphery of the distal branches of the right lower lobe (series 9 images [id number]) likely artifactual and related to volume averaging artifact with adjacent bronchi as well as poor contrast opacification. No definite CT evidence of pulmonary embolism. Mediastinum/Nodes: There is fullness of the right hilum measuring 15 mm in short axis. Ill-defined soft tissue density extending from the hila along the bronchi are not well evaluated but likely thickened  bronchial wall with endobronchial debris. Evaluation of the mediastinum and hila is very limited. No mediastinal fluid collection. The esophagus is not well visualized. Lungs/Pleura: There are extensive consolidative changes of the lung bases and lower lobes progressed compared to the prior CT. There is diffuse areas of nodularity is at the lung bases. Diffuse thickening of the bronchial walls with mucous impaction involving  the lower lobes. There is mucous impaction of the right lower lumbar bronchus. There is narrowing of the central bronchi likely secondary to edema. There is a small to moderate right and trace left pleural effusion. There is a background of emphysema. Minimal amount of air in the left apical pleural surface noted, appears chronic and seen on the CT of the cervical spine dated 02/06/2018. No pneumothorax. Musculoskeletal: Diffuse soft tissue edema and anasarca. There is loss of subcutaneous fat and cachexia. No acute osseous pathology. Extensive right chest wall venous collaterals possibly related to a degree of SVC occlusion. Review of the MIP images confirms the above findings. CT ABDOMEN and PELVIS FINDINGS No intra-abdominal free air or free fluid. Hepatobiliary: The liver is grossly unremarkable. No intrahepatic biliary ductal dilatation. Cholecystectomy. Pancreas: Changes of chronic pancreatitis with dystrophic calcification and dilatation of the main pancreatic duct. Evaluation of the pancreas is limited due to suboptimal visualization. Spleen: Normal in size without focal abnormality. Adrenals/Urinary Tract: The adrenal glands are unremarkable. Small right renal cyst. The kidneys, kidneys and urinary bladder appear unremarkable. Stomach/Bowel: Oral contrast mixed with stool noted throughout the colon. There is no bowel dilatation or evidence of obstruction. The appendix is not identified with certainty. Vascular/Lymphatic: Extensive aortoiliac atherosclerotic disease. No portal venous  gas. Evaluation for adenopathy is limited due to diffuse edema and anasarca as well as paucity of abdominal fat. Reproductive: The prostate and seminal vesicles are grossly unremarkable. Other: Diffuse edema and anasarca as well as cachexia. Musculoskeletal: No acute osseous pathology.  Osteopenia. Review of the MIP images confirms the above findings. IMPRESSION: 1. No definite CT evidence of pulmonary embolism. 2. Diffuse thickening of the bronchial wall with endobronchial mucous content predominantly involving the lower lobes. Large areas of airspace opacity involving the lower lobes and lung bases bilaterally, right greater left most consistent with pneumonia possibly related to aspiration. Clinical correlation is recommended. Bilateral pleural effusions, right greater left. 3. No definite acute intra-abdominal or pelvic pathology. 4. Diffuse soft tissue edema and anasarca.  Cachexia. Electronically Signed   By: Anner Crete M.D.   On: 02/07/2018 02:40   Dg Chest Port 1 View  Result Date: 02/14/2018 CLINICAL DATA:  Acute onset of respiratory distress. EXAM: PORTABLE CHEST 1 VIEW COMPARISON:  Chest radiograph performed 02/13/2018, and CTA of the chest performed 02/07/2018 FINDINGS: The lungs are well-aerated. Small to moderate right and small left pleural effusions are again noted. Bibasilar airspace opacities are concerning for pneumonia given the appearance on prior CTA, perhaps slightly worsened from the prior study. No pneumothorax is seen. The cardiomediastinal silhouette is within normal limits. No acute osseous abnormalities are seen. IMPRESSION: Small to moderate right and small left pleural effusions again noted. Bibasilar airspace opacities are concerning for pneumonia given the appearance on prior CTA, perhaps slightly worsened from the prior study. Electronically Signed   By: Garald Balding M.D.   On: 02/14/2018 06:23   Dg Chest Port 1 View  Result Date: 02/13/2018 CLINICAL DATA:   Respiratory failure EXAM: PORTABLE CHEST 1 VIEW COMPARISON:  02/12/2018 FINDINGS: Cardiac shadow is stable. Endotracheal tube and nasogastric catheter have been removed in the interval. Some slight increase in right pleural effusion is noted although this may be positional in nature. Small left pleural effusion is again noted. Bibasilar infiltrates are seen stable from the prior exam. IMPRESSION: No significant interval change from the prior study. Electronically Signed   By: Inez Catalina M.D.   On: 02/13/2018 07:10   Dg Chest  Port 1 View  Result Date: 02/12/2018 CLINICAL DATA:  Respiratory failure, history tonsillar cancer, COPD, GERD, smoker EXAM: PORTABLE CHEST 1 VIEW COMPARISON:  Portable exam 0704 hours compared to 02/11/2018 FINDINGS: Tip of endotracheal tube is at the level of the aortic arch, likely above carina though the carina is not adequately visualized. Nasogastric tube extends into stomach. Normal heart size, mediastinal contours, and pulmonary vascularity. Emphysematous changes with bibasilar infiltrates and pleural effusions. Upper lungs clear. No pneumothorax. IMPRESSION: Bibasilar pulmonary infiltrates, slightly improved. Persistent bibasilar pleural effusions. Underlying emphysematous changes. Electronically Signed   By: Lavonia Dana M.D.   On: 02/12/2018 08:15   Dg Chest Port 1 View  Result Date: 02/11/2018 CLINICAL DATA:  62 y/o  M; ET tube and OG tube. EXAM: PORTABLE CHEST 1 VIEW COMPARISON:  02/06/2018 chest radiograph.  02/07/2018 CT chest. FINDINGS: Stable cardiac silhouette given projection and technique. Small right larger than left pleural effusions and bibasilar consolidations. Endotracheal tube tip 4.6 cm above the carina. Enteric tube tip projects over gastric body. Right upper quadrant surgical clips, presumably cholecystectomy. Bones are unremarkable. IMPRESSION: 1. Endotracheal tube tip 4.6 cm above the carina. Enteric tube tip projects over gastric body. 2. Stable small  right larger than left pleural effusions and bibasilar consolidations of the lungs. Electronically Signed   By: Kristine Garbe M.D.   On: 02/11/2018 02:02   Dg Swallowing Func-speech Pathology  Result Date: 02/15/2018 Objective Swallowing Evaluation: Type of Study: MBS-Modified Barium Swallow Study  Patient Details Name: Russell Williamson MRN: [id number] Date of Birth: Jan 27, 1956 Today's Date: 02/15/2018 Time: SLP Start Time (ACUTE ONLY): 7619 -SLP Stop Time (ACUTE ONLY): 1143 SLP Time Calculation (min) (ACUTE ONLY): 24 min Past Medical History: Past Medical History: Diagnosis Date . Anemia  . Arthritis  . Cervical adenopathy 04/26/2012 . Chronic back pain   lower . Chronic pancreatitis ([place])  . Constipation  . COPD (chronic obstructive pulmonary disease) ([place])  . Daily headache 05/23/2012  "small ones" . Deficiency anemia 04/15/2016 . Dyspnea   with exertion . G tube feedings (HCC)   hx of  feeding tube in stomach . GERD (gastroesophageal reflux disease)  . History of blood transfusion ~ 2004  "from the pancreatitis" . History of radiation therapy 02/02/2011-03/22/2011  head/neck,left tonsil . Hx of radiation therapy 02/02/11 to 03/22/11  L tonsil . Hypertension  . Insomnia  . 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 Pancytopenia ([place]) 02/28/2014 . Poor venous access 04/28/2016 . 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 Thrush 11/02/2013 . 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 Urinary hesitancy  Past Surgical History: Past Surgical History: Procedure Laterality Date . BILE DUCT STENT PLACEMENT    hx of . CHOLECYSTECTOMY  04/2005 . DIRECT LARYNGOSCOPY  05/23/2012  Procedure: DIRECT LARYNGOSCOPY;  Surgeon: Rozetta Nunnery, MD;  Location: [place];  Service: ENT;  Laterality: N/A; . DIRECT LARYNGOSCOPY N/A 03/23/2013  Procedure: DIRECT LARYNGOSCOPY;  Surgeon: Rozetta Nunnery, MD;  Location: [place];  Service: ENT;  Laterality: N/A; . ESOPHAGEAL DILATION  N/A 03/23/2013  Procedure: ESOPHAGEAL DILATION;  Surgeon: Rozetta Nunnery, MD;  Location: [place];  Service: ENT;  Laterality: N/A; . ESOPHAGOGASTRODUODENOSCOPY (EGD) WITH PROPOFOL N/A 03/01/2017  Procedure: ESOPHAGOGASTRODUODENOSCOPY (EGD) WITH PROPOFOL;  Surgeon: Ladene Artist, MD;  [facility];  Service: Endoscopy;  Laterality: N/A; . FLEXIBLE SIGMOIDOSCOPY N/A 03/01/2017  Procedure: FLEXIBLE SIGMOIDOSCOPY;  Surgeon: Ladene Artist, MD;  Location: [facility];  Service: Endoscopy;  Laterality: N/A; . IR GENERIC HISTORICAL  04/29/2016  IR US GUIDE VASC ACCESS RIGHT 04/29/2016 [facility] . IR GENERIC HISTORICAL  04/29/2016  IR FLUORO GUIDE CV LINE RIGHT 04/29/2016 [facility] . IR GENERIC HISTORICAL  05/12/2016  IR US GUIDE VASC ACCESS LEFT 05/12/2016 [facility] . IR GENERIC HISTORICAL  05/12/2016  IR FLUORO GUIDE CV LINE LEFT 05/12/2016 [facility] . IR GENERIC HISTORICAL  06/28/2016  IR US GUIDE VASC ACCESS RIGHT 06/28/2016 Sandi Mariscal, MD [facility] . IR GENERIC HISTORICAL  06/28/2016  IR FLUORO GUIDE PORT INSERTION RIGHT 06/28/2016 Sandi Mariscal, MD [facility] . IR REMOVAL TUN ACCESS W/ PORT W/O FL MOD SED  02/10/2017 . MASS BIOPSY  05/23/2012  Procedure: NECK MASS BIOPSY;  Surgeon: Rozetta Nunnery, MD;  Location: [place];  Service: ENT;  Laterality: N/A; . RADICAL NECK DISSECTION  05/23/2012  w/mass excision . RADICAL NECK DISSECTION  05/23/2012  Procedure: RADICAL NECK DISSECTION;  Surgeon: Rozetta Nunnery, MD;  Location: [place];  Service: ENT;  Laterality: Left; . TIBIA FRACTURE SURGERY  1990's  left HPI: Pt is a 63 y.o. male admitted s/p fall (questionable LOC); CXR showed RLL PNA, CT Head negative for acute changes. Pt had a change in status requiring intubation 5/11-5/12. 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   Subjective: pt says he has been trying to spit up phlegm from his throat all morning Assessment / Plan / Recommendation CHL IP CLINICAL IMPRESSIONS 02/15/2018 Clinical Impression Pt's pharyngeal dysphagia has worsened since initial MBS 5/8, likely due to further loss of functional reserve as pt is globally more deconditioned. He now has an increased amount of residue that remains in the valleculae, posterior pharyngeal wall, and pyriform sinuses, which he penetrates after the swallow even with purees - which did not enter his airway on the last study. Pt cannot completely expel penetrates of nectar thick liquids or large boluses of honey thick liquids. SLP provided Min cues for effortful swallows and immediate coughs to better protect the airway and reduce residue with purees and honey thick liquids in small boluses. Recommend Dys 1 diet and honey thick liquids by spoon only, using multiple hard swallows and coughs per bolus. This will likely be an effortful means of getting in nutrition and he remains at risk for dehyrdation/malnutrition. Pt has a chronic risk for aspiration that can be reduced but not eliminated. Continue to recommend additional conversations about [place] with MD.  SLP Visit Diagnosis Dysphagia, pharyngoesophageal phase (R13.14) Attention and concentration deficit following -- Frontal lobe and executive function deficit following -- Impact on safety and function Severe aspiration risk;Moderate aspiration risk;Risk for inadequate nutrition/hydration   CHL IP TREATMENT RECOMMENDATION 02/15/2018 Treatment Recommendations Therapy as outlined in treatment plan below   Prognosis 02/15/2018 Prognosis for Safe Diet Advancement Guarded Barriers to Reach Goals Time post onset;Severity of deficits Barriers/Prognosis Comment -- CHL IP DIET  RECOMMENDATION 02/15/2018 SLP Diet Recommendations Dysphagia 1 (Puree) solids;Honey thick liquids Liquid Administration via Spoon Medication Administration Crushed with puree Compensations Slow rate;Small sips/bites;Hard cough after swallow;Multiple dry swallows after each bite/sip Postural Changes Seated upright at 90 degrees;Remain semi-upright after after feeds/meals (Comment)   CHL IP OTHER RECOMMENDATIONS 02/15/2018 Recommended Consults -- Oral  Care Recommendations Oral care QID Other Recommendations Have oral suction available;Order thickener from pharmacy;Prohibited food (jello, ice cream, thin soups);Remove water pitcher   CHL IP FOLLOW UP RECOMMENDATIONS 02/15/2018 Follow up Recommendations Skilled Nursing facility   [facility] IP FREQUENCY AND DURATION 02/15/2018 Speech Therapy Frequency (ACUTE ONLY) min 2x/week Treatment Duration 2 weeks      CHL IP ORAL PHASE 02/15/2018 Oral Phase WFL Oral - Pudding Teaspoon -- Oral - Pudding Cup -- Oral - Honey Teaspoon -- Oral - Honey Cup -- Oral - Nectar Teaspoon -- Oral - Nectar Cup -- Oral - Nectar Straw -- Oral - Thin Teaspoon -- Oral - Thin Cup -- Oral - Thin Straw -- Oral - Puree -- Oral - Mech Soft -- Oral - Regular -- Oral - Multi-Consistency -- Oral - Pill -- Oral Phase - Comment --  CHL IP PHARYNGEAL PHASE 02/15/2018 Pharyngeal Phase Impaired Pharyngeal- Pudding Teaspoon -- Pharyngeal -- Pharyngeal- Pudding Cup -- Pharyngeal -- Pharyngeal- Honey Teaspoon Delayed swallow initiation-vallecula;Reduced pharyngeal peristalsis;Reduced epiglottic inversion;Reduced anterior laryngeal mobility;Reduced laryngeal elevation;Reduced airway/laryngeal closure;Reduced tongue base retraction;Pharyngeal residue - valleculae;Pharyngeal residue - pyriform;Pharyngeal residue - posterior pharnyx;Penetration/Apiration after swallow Pharyngeal Material enters airway, remains ABOVE vocal cords and not ejected out Pharyngeal- Honey Cup Delayed swallow initiation-vallecula;Reduced pharyngeal  peristalsis;Reduced epiglottic inversion;Reduced anterior laryngeal mobility;Reduced laryngeal elevation;Reduced airway/laryngeal closure;Reduced tongue base retraction;Pharyngeal residue - valleculae;Pharyngeal residue - pyriform;Pharyngeal residue - posterior pharnyx;Penetration/Apiration after swallow Pharyngeal Material enters airway, remains ABOVE vocal cords and not ejected out Pharyngeal- Nectar Teaspoon Delayed swallow initiation-vallecula;Reduced pharyngeal peristalsis;Reduced epiglottic inversion;Reduced anterior laryngeal mobility;Reduced laryngeal elevation;Reduced airway/laryngeal closure;Reduced tongue base retraction;Pharyngeal residue - valleculae;Pharyngeal residue - pyriform;Pharyngeal residue - posterior pharnyx;Penetration/Aspiration during swallow;Penetration/Apiration after swallow Pharyngeal -- Pharyngeal- Nectar Cup NT Pharyngeal -- Pharyngeal- Nectar Straw -- Pharyngeal -- Pharyngeal- Thin Teaspoon NT Pharyngeal -- Pharyngeal- Thin Cup NT Pharyngeal -- Pharyngeal- Thin Straw -- Pharyngeal -- Pharyngeal- Puree Delayed swallow initiation-vallecula;Reduced pharyngeal peristalsis;Reduced epiglottic inversion;Reduced anterior laryngeal mobility;Reduced laryngeal elevation;Reduced airway/laryngeal closure;Reduced tongue base retraction;Pharyngeal residue - valleculae;Pharyngeal residue - pyriform;Pharyngeal residue - posterior pharnyx;Penetration/Apiration after swallow Pharyngeal Material enters airway, remains ABOVE vocal cords and not ejected out Pharyngeal- Mechanical Soft -- Pharyngeal -- Pharyngeal- Regular -- Pharyngeal -- Pharyngeal- Multi-consistency -- Pharyngeal -- Pharyngeal- Pill -- Pharyngeal -- Pharyngeal Comment --  CHL IP CERVICAL ESOPHAGEAL PHASE 02/15/2018 Cervical Esophageal Phase Impaired Pudding Teaspoon -- Pudding Cup -- Honey Teaspoon Reduced cricopharyngeal relaxation Honey Cup Reduced cricopharyngeal relaxation Nectar Teaspoon Reduced cricopharyngeal relaxation Nectar Cup  NT Nectar Straw -- Thin Teaspoon NT Thin Cup NT Thin Straw -- Puree Reduced cricopharyngeal relaxation Mechanical Soft -- Regular -- Multi-consistency -- Pill -- Cervical Esophageal Comment -- No flowsheet data found. [name] 02/15/2018, 2:40 PM  [name], M.A. CCC-SLP [phone]             Dg Swallowing Func-speech Pathology  Result Date: 02/08/2018 Objective Swallowing Evaluation: Type of Study: MBS-Modified Barium Swallow Study  Patient Details Name: Russell Williamson MRN: [id number] Date of Birth: 1956/08/09 Today's Date: 02/08/2018 Time: SLP Start Time (ACUTE ONLY): 1345 -SLP Stop Time (ACUTE ONLY): 1407 SLP Time Calculation (min) (ACUTE ONLY): 22 min Past Medical History: Past Medical History: Diagnosis Date . Anemia  . Arthritis  . Cervical adenopathy 04/26/2012 . Chronic back pain   lower . Chronic pancreatitis ([place])  . Constipation  . COPD (chronic obstructive pulmonary disease) ([place])  . Daily headache 05/23/2012  "small ones" . Deficiency anemia 04/15/2016 . Dyspnea   with exertion . G tube feedings (HCC)   hx of  feeding tube in stomach . GERD (gastroesophageal reflux disease)  . History of blood transfusion ~ 2004  "from the pancreatitis" . History of radiation therapy 02/02/2011-03/22/2011  head/neck,left tonsil . Hx of radiation therapy 02/02/11 to 03/22/11  L tonsil . Hypertension  . Insomnia  . 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 Pancytopenia ([place]) 02/28/2014 . Poor venous access 04/28/2016 . 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 Thrush 11/02/2013 . 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 Urinary hesitancy  Past Surgical History: Past Surgical History: Procedure Laterality Date . BILE DUCT STENT PLACEMENT    hx of . CHOLECYSTECTOMY  04/2005 . DIRECT LARYNGOSCOPY  05/23/2012  Procedure: DIRECT LARYNGOSCOPY;  Surgeon: Rozetta Nunnery, MD;  Location: [place];  Service: ENT;  Laterality: N/A; . DIRECT LARYNGOSCOPY N/A 03/23/2013  Procedure:  DIRECT LARYNGOSCOPY;  Surgeon: Rozetta Nunnery, MD;  Location: [place];  Service: ENT;  Laterality: N/A; . ESOPHAGEAL DILATION N/A 03/23/2013  Procedure: ESOPHAGEAL DILATION;  Surgeon: Rozetta Nunnery, MD;  Location: [place];  Service: ENT;  Laterality: N/A; . ESOPHAGOGASTRODUODENOSCOPY (EGD) WITH PROPOFOL N/A 03/01/2017  Procedure: ESOPHAGOGASTRODUODENOSCOPY (EGD) WITH PROPOFOL;  Surgeon: Ladene Artist, MD;  [facility];  Service: Endoscopy;  Laterality: N/A; . FLEXIBLE SIGMOIDOSCOPY N/A 03/01/2017  Procedure: FLEXIBLE SIGMOIDOSCOPY;  Surgeon: Ladene Artist, MD;  [facility];  Service: Endoscopy;  Laterality: N/A; . IR GENERIC HISTORICAL  04/29/2016  IR US GUIDE VASC ACCESS RIGHT 04/29/2016 [facility] . IR GENERIC HISTORICAL  04/29/2016  IR FLUORO GUIDE CV LINE RIGHT 04/29/2016 [facility] . IR GENERIC HISTORICAL  05/12/2016  IR US GUIDE VASC ACCESS LEFT 05/12/2016 [facility] . IR GENERIC HISTORICAL  05/12/2016  IR FLUORO GUIDE CV LINE LEFT 05/12/2016 [facility] . IR GENERIC HISTORICAL  06/28/2016  IR US GUIDE VASC ACCESS RIGHT 06/28/2016 Sandi Mariscal, MD [facility] . IR GENERIC HISTORICAL  06/28/2016  IR FLUORO GUIDE PORT INSERTION RIGHT 06/28/2016 Sandi Mariscal, MD [facility] . IR REMOVAL TUN ACCESS W/ PORT W/O FL MOD SED  02/10/2017 . MASS BIOPSY  05/23/2012  Procedure: NECK MASS BIOPSY;  Surgeon: Rozetta Nunnery, MD;  Location: [place];  Service: ENT;  Laterality: N/A; . RADICAL NECK DISSECTION  05/23/2012  w/mass excision . RADICAL NECK DISSECTION  05/23/2012  Procedure: RADICAL NECK DISSECTION;  Surgeon: Rozetta Nunnery, MD;  Location: [place];  Service: ENT;  Laterality: Left; . TIBIA FRACTURE SURGERY  1990's  left HPI: Pt is a 62 y.o. male admitted s/p fall (questionable LOC); CXR showed RLL PNA, CT Head negative for acute changes. 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   Subjective: describes having to puree all his food to get it to go down at home Assessment / Plan / Recommendation CHL IP CLINICAL IMPRESSIONS 02/08/2018 Clinical Impression Pt has a moderate pharyngeal dysphagia suspected to be chronic in nature secondary to history of radiation tx for throat cancer. He has minimal hyolaryngeal excursion, epiglottic deflection, base of tongue retraction, and pharyngeal squeeze, resulting in poor swallow efficiency and limited airway closure. Moderate residue remains at the entrance of the laryngeal vestibule and pyriform sinuses with thin and nectar thick liquids, which are  silently penetrated/aspiration during the swallow. His cough is not effective at clearing aspirates, but if he performs a heard cough and re-swallow he can clear most penetrates with cup sips of nectar thick liquids and spoonfuls of thin liquids, as penetration is more shallow. Increasing amounts of residue remain in the valleculae as consistencies become thicker, with honey thick liquids still penetrating into the laryngeal vestibule. Pt is likely at a high risk for aspiration, malnutrition, and dehydration with any consistencies recommended. In light of his acute PNA would favor a more conservative diet of Dys 1 textures and nectar thick liquids by cup using hard swallows, hard coughs, and hard second swallows. Teaspoons of thin water could be trialed between meals after oral care using the same strategies. MD may also wish to consider conversation about overall GOC given his likely chronic dysphagia. SLP Visit Diagnosis Dysphagia, pharyngoesophageal phase (R13.14) Attention and concentration deficit following -- Frontal lobe and executive function  deficit following -- Impact on safety and function Severe aspiration risk;Moderate aspiration risk;Risk for inadequate nutrition/hydration   CHL IP TREATMENT RECOMMENDATION 02/08/2018 Treatment Recommendations Therapy as outlined in treatment plan below   Prognosis 02/08/2018 Prognosis for Safe Diet Advancement Guarded Barriers to Reach Goals Time post onset;Severity of deficits Barriers/Prognosis Comment -- CHL IP DIET RECOMMENDATION 02/08/2018 SLP Diet Recommendations Dysphagia 1 (Puree) solids;Nectar thick liquid Liquid Administration via Cup;No straw Medication Administration Crushed with puree Compensations Slow rate;Small sips/bites;Hard cough after swallow;Multiple dry swallows after each bite/sip Postural Changes Remain semi-upright after after feeds/meals (Comment);Seated upright at 90 degrees   CHL IP OTHER RECOMMENDATIONS 02/08/2018 Recommended Consults -- Oral Care Recommendations Oral care QID Other Recommendations Have oral suction available;Order thickener from pharmacy;Prohibited food (jello, ice cream, thin soups);Remove water pitcher   CHL IP FOLLOW UP RECOMMENDATIONS 02/08/2018 Follow up Recommendations (No Data)   CHL IP FREQUENCY AND DURATION 02/08/2018 Speech Therapy Frequency (ACUTE ONLY) min 2x/week Treatment Duration 2 weeks      CHL IP ORAL PHASE 02/08/2018 Oral Phase WFL Oral - Pudding Teaspoon -- Oral - Pudding Cup -- Oral - Honey Teaspoon -- Oral - Honey Cup -- Oral - Nectar Teaspoon -- Oral - Nectar Cup -- Oral - Nectar Straw -- Oral - Thin Teaspoon -- Oral - Thin Cup -- Oral - Thin Straw -- Oral - Puree -- Oral - Mech Soft -- Oral - Regular -- Oral - Multi-Consistency -- Oral - Pill -- Oral Phase - Comment --  CHL IP PHARYNGEAL PHASE 02/08/2018 Pharyngeal Phase Impaired Pharyngeal- Pudding Teaspoon -- Pharyngeal -- Pharyngeal- Pudding Cup -- Pharyngeal -- Pharyngeal- Honey Teaspoon -- Pharyngeal -- Pharyngeal- Honey Cup Delayed swallow initiation-vallecula;Reduced pharyngeal peristalsis;Reduced  epiglottic inversion;Reduced anterior laryngeal mobility;Reduced laryngeal elevation;Reduced airway/laryngeal closure;Reduced tongue base retraction;Pharyngeal residue - valleculae;Penetration/Aspiration during swallow Pharyngeal Material enters airway, remains ABOVE vocal cords and not ejected out Pharyngeal- Nectar Teaspoon -- Pharyngeal -- Pharyngeal- Nectar Cup Reduced pharyngeal peristalsis;Reduced epiglottic inversion;Reduced anterior laryngeal mobility;Reduced laryngeal elevation;Reduced airway/laryngeal closure;Reduced tongue base retraction;Pharyngeal residue - valleculae;Penetration/Aspiration during swallow;Penetration/Aspiration before swallow;Delayed swallow initiation-pyriform sinuses;Compensatory strategies attempted (with notebox) Pharyngeal Material enters airway, remains ABOVE vocal cords and not ejected out Pharyngeal- Nectar Straw -- Pharyngeal -- Pharyngeal- Thin Teaspoon Reduced pharyngeal peristalsis;Reduced epiglottic inversion;Reduced anterior laryngeal mobility;Reduced laryngeal elevation;Reduced airway/laryngeal closure;Reduced tongue base retraction;Pharyngeal residue - valleculae;Penetration/Aspiration during swallow;Delayed swallow initiation-vallecula;Compensatory strategies attempted (with notebox) Pharyngeal Material enters airway, remains ABOVE vocal cords and not ejected out Pharyngeal- Thin Cup Reduced pharyngeal peristalsis;Reduced epiglottic inversion;Reduced anterior laryngeal mobility;Reduced laryngeal elevation;Reduced airway/laryngeal closure;Reduced tongue base retraction;Pharyngeal residue -  valleculae;Delayed swallow initiation-pyriform sinuses;Penetration/Aspiration before swallow;Penetration/Aspiration during swallow;Compensatory strategies attempted (with notebox) Pharyngeal Material enters airway, passes BELOW cords without attempt by patient to eject out (silent aspiration) Pharyngeal- Thin Straw -- Pharyngeal -- Pharyngeal- Puree Delayed swallow  initiation-vallecula;Reduced pharyngeal peristalsis;Reduced epiglottic inversion;Reduced anterior laryngeal mobility;Reduced laryngeal elevation;Reduced airway/laryngeal closure;Reduced tongue base retraction;Pharyngeal residue - valleculae Pharyngeal -- Pharyngeal- Mechanical Soft -- Pharyngeal -- Pharyngeal- Regular -- Pharyngeal -- Pharyngeal- Multi-consistency -- Pharyngeal -- Pharyngeal- Pill -- Pharyngeal -- Pharyngeal Comment --  CHL IP CERVICAL ESOPHAGEAL PHASE 02/08/2018 Cervical Esophageal Phase Impaired Pudding Teaspoon -- Pudding Cup -- Honey Teaspoon -- Honey Cup Reduced cricopharyngeal relaxation Nectar Teaspoon -- Nectar Cup Reduced cricopharyngeal relaxation Nectar Straw -- Thin Teaspoon Reduced cricopharyngeal relaxation Thin Cup Reduced cricopharyngeal relaxation Thin Straw -- Puree Reduced cricopharyngeal relaxation Mechanical Soft -- Regular -- Multi-consistency -- Pill -- Cervical Esophageal Comment -- No flowsheet data found. [name] 02/08/2018, 3:06 PM  [name], M.A. CCC-SLP [phone]              Lab Data:  CBC: Recent Labs  Lab 02/12/18 0413 02/13/18 0803 02/14/18 0353 02/15/18 0351 02/16/18 0538  WBC 6.5 9.7 5.5 3.4* 4.0  HGB 8.8* 10.1* 10.1* 10.0* 10.3*  HCT 25.5* 30.8* 29.8* 30.8* 30.6*  MCV 92.1 96.9 97.1 97.2 96.5  PLT 226 275 319 326 374   Basic Metabolic Panel: Recent Labs  Lab 02/10/18 0322 02/11/18 0044 02/11/18 0727  02/12/18 0413 02/13/18 0803 02/14/18 0353 02/15/18 0351 02/16/18 0538  NA 135 131* 134*   < > 134* 138 138 139 140  K 4.1 4.7 5.5*   < > 2.9* 4.0 3.6 3.1* 3.2*  CL 94* 90* 94*   < > 102 107 103 101 100*  CO2 32 29 26   < > _0 GLUCOSE 125* 238* 80   < > 125* 98 72 72 90  BUN _1 < > _2 <5* <5*  CREATININE 0.42* 0.69 0.62   < > 0.47* 0.52* 0.47* 0.49* 0.50*  CALCIUM 8.4* 8.4* 8.0*   < > 7.1* 7.7* 7.9* 7.9* 8.0*  MG 1.6* 1.6* 1.9  --   --  1.4*  --   --   --   PHOS  --  5.4* 2.7  --   --  3.0  2.4*  --   --    < > = values in this interval not displayed.   GFR: Estimated Creatinine Clearance: 64.1 mL/min (A) (by C-G formula based on SCr of 0.5 mg/dL (L)). Liver Function Tests: Recent Labs  Lab 02/10/18 0322 02/11/18 0727 02/12/18 0413 02/14/18 0353  AST 92* 85* 48*  --   ALT 73* 55 47  --   ALKPHOS 60 52 48  --   BILITOT 0.5 1.5* 0.6  --   PROT 5.5* 5.4* 4.9*  --   ALBUMIN 2.3* 2.4* 1.9* 2.1*   No results for input(s): LIPASE, AMYLASE in the last 168 hours. No results for input(s): AMMONIA in the last 168 hours. Coagulation Profile: No results for input(s): INR, PROTIME in the last 168 hours. Cardiac Enzymes: Recent Labs  Lab 02/11/18 0044 02/11/18 0122 02/11/18 0727 02/11/18 1246 02/12/18 0413  CKTOTAL  --  521*  --   --  263  CKMB  --   --   --   --  3.3  TROPONINI 0.13*  --  0.07* 0.05*  --    BNP (last 3 results) No  results for input(s): PROBNP in the last 8760 hours. HbA1C: No results for input(s): HGBA1C in the last 72 hours. CBG: Recent Labs  Lab 02/15/18 2111 02/16/18 0017 02/16/18 0424 02/16/18 0749 02/16/18 0907  GLUCAP 117* 137* 98 96 93   Lipid Profile: No results for input(s): CHOL, HDL, LDLCALC, TRIG, CHOLHDL, LDLDIRECT in the last 72 hours. Thyroid Function Tests: No results for input(s): TSH, T4TOTAL, FREET4, T3FREE, THYROIDAB in the last 72 hours. Anemia Panel: No results for input(s): VITAMINB12, FOLATE, FERRITIN, TIBC, IRON, RETICCTPCT in the last 72 hours. Urine analysis:    Component Value Date/Time   COLORURINE AMBER (A) 02/06/2018 1616   APPEARANCEUR HAZY (A) 02/06/2018 1616   LABSPEC 1.018 02/06/2018 1616   PHURINE 5.0 02/06/2018 1616   GLUCOSEU NEGATIVE 02/06/2018 1616   HGBUR MODERATE (A) 02/06/2018 1616   BILIRUBINUR NEGATIVE 02/06/2018 1616   KETONESUR NEGATIVE 02/06/2018 1616   PROTEINUR 30 (A) 02/06/2018 1616   UROBILINOGEN 0.2 08/31/2014 2350   NITRITE NEGATIVE 02/06/2018 1616   LEUKOCYTESUR NEGATIVE  02/06/2018 1616     Domenic Polite M.D. [facility] 02/16/2018, 12:28 PM  Page via [email or web address] - password [email or web address]  Call night coverage person after 7pm

## 2018-02-16 NOTE — Progress Notes (Signed)
Pharmacy Antibiotic Note  Russell Williamson is a 62 y.o. male admitted on 02/06/2018 with pneumonia.  On D#4 Vancomycin/Zosyn dosing for possible aspiration PNA as well as known MRSA positive in sputum culture. Pt has been afebrile, wbc 5.5K today. Renal function is stable, Scr  down to 0.5.   Vancomycin trough is 10 subtherapeutic on 500 mg Q 8 hrs, drawn 7.5 hrs after last dose.  Plan: Increase vancomycin to 750 mg IV Q8H F/u renal fxn, clinical status and recheck trough after 3-5 doses   Height: 5\' 9"  (175.3 cm) Weight: 102 lb 15.3 oz (46.7 kg) IBW/kg (Calculated) : 70.7  Temp (24hrs), Avg:98.3 F (36.8 C), Min:98 F (36.7 C), Max:98.5 F (36.9 C)  Recent Labs  Lab 02/11/18 0044 02/11/18 0727 02/11/18 1812 02/12/18 0413 02/13/18 0803 02/14/18 0353 02/14/18 1725 02/15/18 0351 02/16/18 0538 02/16/18 1032  WBC 6.1 7.5  --  6.5 9.7 5.5  --  3.4* 4.0  --   CREATININE 0.69 0.62 0.61 0.47* 0.52* 0.47*  --  0.49* 0.50*  --   LATICACIDVEN 3.4* 5.2* 3.8* 1.5  --   --   --   --   --   --   VANCOTROUGH  --   --   --   --   --   --  6*  --   --  10*    Estimated Creatinine Clearance: 64.1 mL/min (A) (by C-G formula based on SCr of 0.5 mg/dL (L)).    No Known Allergies  Gwendalynn Eckstrom A. Levada Dy, PharmD, [place] Pager: [phone]    02/16/2018 12:40 PM

## 2018-02-16 NOTE — Progress Notes (Signed)
Nutrition Follow-up  DOCUMENTATION CODES:   Underweight, Severe malnutrition in context of chronic illness  INTERVENTION:  Ensure Enlive po TID -  thickened to honey thick,each supplement provides 350 kcal and 20 grams of protein  NUTRITION DIAGNOSIS:   Severe Malnutrition related to chronic illness(chronic pancreatitis, COPD, hx of throat cancer) as evidenced by severe muscle depletion, severe fat depletion. -ongoing  GOAL:   Patient will meet greater than or equal to 90% of their needs -progressing  MONITOR:   Supplement acceptance, I & O's, PO intake, Weight trends  ASSESSMENT:   62 yo Male with PMH significant of throat cancer, polysubstance abuse, anemia, osteoarthritis, cervical adenopathy, chronic lower back pain, chronic pancreatitis, constipation, chronic dyspnea, GERD, COPD, HTN.  Spoke with Mr. Gin this morning. He is eating ok, ate about 50% of eggs this morning, also had sausage, potatoes, milk and apple juice. Patient was requesting ensure during visit, ordered. No other complaints at this time.  Dispo: SNF in 1-2 days  Labs reviewed:  K+ 3.2  Medications reviewed and include:  Folic acid, Creon, Thiamine NS at 33mL/hr  Diet Order:   Diet Order           DIET - DYS 1 Room service appropriate? Yes; Fluid consistency: Honey Thick  Diet effective now        DIET - DYS 1          EDUCATION NEEDS:   No education needs have been identified at this time  Skin:  Skin Assessment: Skin Integrity Issues: Skin Integrity Issues:: Stage III Stage III: Sacrum  Last BM:  02/15/2018  Height:   Ht Readings from Last 1 Encounters:  02/07/18 5\' 9"  (1.753 m)    Weight:   Wt Readings from Last 1 Encounters:  02/16/18 102 lb 15.3 oz (46.7 kg)    Ideal Body Weight:  72.7 kg  BMI:  Body mass index is 15.2 kg/m.  Estimated Nutritional Needs:   Kcal:  1600-1800  Protein:  85-100 grams  Fluid:  > 1.6 L  Satira Anis. Shaquilla Kehres, MS, RD LDN Inpatient  Clinical Dietitian Pager [phone]

## 2018-02-16 NOTE — Progress Notes (Signed)
  Speech Language Pathology Treatment: Dysphagia  Patient Details Name: Russell Williamson MRN: [id number] DOB: July 24, 1956 Today's Date: 02/16/2018 Time: 4034-7425 SLP Time Calculation (min) (ACUTE ONLY): 13 min  Assessment / Plan / Recommendation Clinical Impression  Pt consumed honey thick liquids by spoon with Min cues for use of recommended strategies - improved from previous sessions. SLP reiterated education from [facility], including risks of aspiration and rationale for current diet. We discussed the risks of such a modified diet, but also the risks of liberalizing it. SLP stressed the importance of oral care in reducing the risk for aspiration related infections regardless of what diet he ends up deciding he wants to try. For now, his main focus seems to be getting healthier and back home - back to his baseline; he wants to try Dys 1 textures and honey thick liquids by spoon. Will continue to follow acutely.    HPI HPI: Pt is a 62 y.o. male admitted s/p fall (questionable LOC); CXR showed RLL PNA, CT Head negative for acute changes. Pt had a change in status requiring intubation 5/11-5/12. 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        SLP Plan  Continue with current plan of care       Recommendations  Diet recommendations: Dysphagia 1 (puree);Honey-thick liquid Liquids provided via: Teaspoon Medication Administration: Crushed with puree Supervision: Patient able to self feed;Intermittent supervision to cue for compensatory strategies Compensations: Slow rate;Small sips/bites;Hard cough after swallow;Multiple dry swallows after each bite/sip Postural Changes and/or Swallow  Maneuvers: Seated upright 90 degrees;Upright 30-60 min after meal                Oral Care Recommendations: Oral care QID Follow up Recommendations: Skilled Nursing facility SLP Visit Diagnosis: Dysphagia, pharyngoesophageal phase (R13.14) Plan: Continue with current plan of care       GO                Russell Williamson 02/16/2018, 4:17 PM  Russell Williamson, M.A. CCC-SLP [phone]

## 2018-02-17 LAB — BASIC METABOLIC PANEL
Anion gap: 8 (ref 5–15)
CHLORIDE: 99 mmol/L — AB (ref 101–111)
CO2: 33 mmol/L — ABNORMAL HIGH (ref 22–32)
Calcium: 8.5 mg/dL — ABNORMAL LOW (ref 8.9–10.3)
Creatinine, Ser: 0.58 mg/dL — ABNORMAL LOW (ref 0.61–1.24)
GFR calc Af Amer: >60 mL/min (ref >60)
GFR calc non Af Amer: >60 mL/min (ref >60)
GLUCOSE: 110 mg/dL — AB (ref 65–99)
POTASSIUM: 4 mmol/L (ref 3.5–5.1)
Sodium: 140 mmol/L (ref 135–145)

## 2018-02-17 LAB — GLUCOSE, CAPILLARY
GLUCOSE-CAPILLARY: 124 mg/dL — AB (ref 65–99)
GLUCOSE-CAPILLARY: 92 mg/dL (ref 65–99)
Glucose-Capillary: 130 mg/dL — ABNORMAL HIGH (ref 65–99)
Glucose-Capillary: 93 mg/dL (ref 65–99)
Glucose-Capillary: 131 mg/dL — ABNORMAL HIGH (ref 65–99)
Glucose-Capillary: 110 mg/dL — ABNORMAL HIGH (ref 65–99)

## 2018-02-17 MED ORDER — OXYCODONE HCL 15 MG PO TABS: 15.0000 mg | ORAL_TABLET | Freq: Three times a day (TID) | ORAL | 0 refills | Status: DC | PRN

## 2018-02-17 MED ORDER — IPRATROPIUM-ALBUTEROL 0.5-2.5 (3) MG/3ML IN SOLN
3.0000 mL | Freq: Two times a day (BID) | RESPIRATORY_TRACT | Status: DC
  Administered 2018-02-17 – 2018-02-18 (×3): 3 mL via RESPIRATORY_TRACT
  Filled 2018-02-17 (×3): qty 3

## 2018-02-17 NOTE — Progress Notes (Signed)
Patient still is with out a ride and is refusing other transportation at this time.

## 2018-02-17 NOTE — Progress Notes (Signed)
Patient said that his niece is his ride today. He is waiting for his niece to call.

## 2018-02-17 NOTE — Discharge Summary (Addendum)
Physician Discharge Summary  Russell Williamson JOI:[id number] DOB: 1956/07/27 DOA: 02/06/2018  PCP: Harvie Junior, MD  Admit date: 02/06/2018 Discharge date: 02/17/2018  Time spent: 40 minutes  Recommendations for Outpatient Follow-up:  1. PCP Dr.Williams in 1 week, please check Bmet at FU, please assess need for diuretics 2. -FU CXR in 4weeks 3. SLP FU at Home and upgrade diet as tolerated 4. Home health Pt/OT/SLP/aide   Discharge Diagnoses:  Principal Problem:   Acute respiratory failure with hypoxia (HCC) Active Problems:   History of cancer tonsil   HTN (hypertension)   Hypothyroidism   Malnutrition of moderate degree (HCC)   Anemia in chronic illness   COPD with chronic bronchitis (HCC)   CAP (community acquired pneumonia)   Syncope   Fall at home, initial encounter   Tobacco abuse   Pressure injury of skin   Discharge Condition: stable Diet recommendation: dysphagia 1 nectar thick liquids  Filed Weights   02/14/18 0500 02/15/18 0500 02/16/18 0500  Weight: 53.4 kg (117 lb 11.6 oz) 48.1 kg (106 lb 0.7 oz) 46.7 kg (102 lb 15.3 oz)    History of present illness:  62 year old male withCOPD, GERD, Tonsillar Cancer s/p Radiation, Dysphagia, Polysubstance Abuse, ETOH, HTN scented to ED on 5/6 with fever and coughing  Hospital Course:   Acute respiratory failure with hypoxia (HCC) with COPD, MRSA pneumonia, aspiration event -patient was intubated on 5/10 for acute respiratory failure secondary to MRSA pneumonia and possible aspiration event from oversedation -Extubated on 5/12, transfered to [facility] service 5/14, then completed 7day course of IV vancomycin today -on 5/15 became hypoxic and hypercarbic secondary to volume overload likely from fluid resuscitation, required BIPAP for few hours, diuresed well with IV lasix , now improved -Echocardiogram shows preserved ejection fraction and normal wall motion  -doesn't appear to need further diuretics, please assess this at  FU. -Pt/OT eval completed, SNF recommended, Pt declined SNF and will go home to family with [facility] services -couldn't be completed weaned off O2 and set up with Home O2 at Discharge  Tonsillar CA status post radiation -Long history of dysphagia now likely worsened following intubation -SLP following,  modified barium swallow  5/15 continues to remain a chronic risk for aspiration, dysphagia 1 diet with honey thick liquids recommended, meds with pure -repeat CT neck this admission shows no evidence of recurrence -Follow-up with oncology  Hypotension: Patient has a history of hypertension -Required pressor support on 5/11, currently resolved, 2D echo 9/18 showed EF of 65% no wall motion abnormalities  Acute metabolic encephalopathy with a history of alcohol, polysubstance abuse. - UDS on arrival positive for opiates, cocaine and benzodiazepines.  Patient was placed on Precedex drip, fentanyl drip in ICU, currently off. -counseled -mentation improved back to baseline    Malnutrition of moderate degree (HCC) -Continue nutritional supplements    Anemia in chronic illness -Hb stable  Nicotine abuse Placed on nicotine patch     Consultations:  PCCM   Discharge Exam: Vitals:   02/17/18 0825 02/17/18 0839  BP: 114/65   Pulse: 66 92  Resp: 14 16  Temp: 98.3 F (36.8 C)   SpO2: 95% 96%    General: AAOx3 Cardiovascular: S1S2/RRR Respiratory: CTAB  Discharge Instructions   Discharge Instructions    Call MD for:  difficulty breathing, headache or visual disturbances   Complete by:  As directed    Call MD for:  extreme fatigue   Complete by:  As directed    Call MD for:  persistant  dizziness or light-headedness   Complete by:  As directed    Call MD for:  persistant nausea and vomiting   Complete by:  As directed    Call MD for:  severe uncontrolled pain   Complete by:  As directed    Call MD for:  temperature >100.4   Complete by:  As directed    DIET - DYS 1    Complete by:  As directed    Fluid consistency:  Nectar Thick   Discharge instructions   Complete by:  As directed    You were seen for pneumonia.  You improved with antibiotics.  We changed your antibiotics on the day of discharge as you were growing MRSA from your sputum culture.   You have lost weight over the past year.  We made some changes to your diet that seem to have improved your ability to tolerate eating.  Please continue this at home (see below).  Continue the compensatory strategies that speech therapy recommended.  Eat slowly with small sips/bites.  Cough after each swallow.  Swallow multiple times after each bite or sip. Sit upright when eating.  See below for additional recommendations.    We're sending you with home health services.   I'm starting you on magnesium, your magnesium has been a bit low here.  Follow up repeat labs as an outpatient.   Please follow up with your PCP in a few days to follow up your pneumonia and your weight loss.    You had elevated liver enzymes.  We sent some labs that should be followed up with your PCP as well.  Return for new, recurrent, or worsening symptoms.  Please ask your PCP to request records from this hospitalization so they know what was done and what the next steps will be.   Speech Therapy Diet Recommendations:  Dysphagia 1 (Puree) solids and nectar thick liquid (tsp thin water in between meals after oral care) Liquid Administration via: Cup; No straw Medication Administration: Crushed medications with puree Supervision: Patient able to self feed.  Full supervision/cueing for compensatory strategies Compensations: Slow rate.  Small sips/bites.  Hard cough after swallow.  Multiple dry swallows after each bite/sip. Postural Changes: Remain semi-upright after after feeds/meals (Comment);Seated upright at 90 degrees Oral Care Recommendations: Oral care QID Other Recommendations: Have oral suction available.  Order thickener from  pharmacy.  Prohibited food (jello, ice cream, thin soups).  Remove water pitcher.   Increase activity slowly   Complete by:  As directed    Increase activity slowly   Complete by:  As directed      Allergies as of 02/17/2018   No Known Allergies     Medication List    TAKE these medications   amLODipine 2.5 MG tablet Commonly known as:  NORVASC Take 2.5 mg by mouth daily.   feeding supplement (ENSURE ENLIVE) Liqd Take 237 mLs by mouth 3 (three) times daily between meals.   folic acid 1 MG tablet Commonly known as:  FOLVITE Take 1 mg by mouth daily.   lipase/protease/amylase 12000 units Cpep capsule Commonly known as:  CREON Take 1 capsule by mouth 4 (four) times daily.   Magnesium 400 MG Tabs Take 400 mg by mouth 2 (two) times daily.   mirtazapine 30 MG tablet Commonly known as:  REMERON Take 30 mg by mouth at bedtime.   oxyCODONE 15 MG immediate release tablet Commonly known as:  ROXICODONE Take 1 tablet (15 mg total) by mouth every 8 (  eight) hours as needed for pain. What changed:  when to take this   pantoprazole 20 MG tablet Commonly known as:  PROTONIX Take 1 tablet (20 mg total) by mouth 2 (two) times daily.   PROAIR HFA 108 (90 Base) MCG/ACT inhaler Generic drug:  albuterol Inhale 2 puff into lungs every 4-6 hours as needed for shortness of breath/wheezing   promethazine 25 MG tablet Commonly known as:  PHENERGAN Take 1 tablet (25 mg total) by mouth every 6 (six) hours as needed for nausea or vomiting.   RESOURCE THICKENUP CLEAR Powd Add to liquids to make nectar thick consistency for dysphagia   temazepam 30 MG capsule Commonly known as:  RESTORIL Take 30 mg by mouth at bedtime as needed for sleep.            Durable Medical Equipment  (From admission, onward)        Start     Ordered   02/10/18 1435  For home use only DME Walker rolling  [facility])  Once    Question:  Patient needs a walker to treat with the following condition  Answer:   Cachexia ([place])   02/10/18 1438   02/10/18 1435  DME tub bench  Once     02/10/18 1438   02/09/18 1311  For home use only DME Walker rolling  Once    Question:  Patient needs a walker to treat with the following condition  Answer:  Weakness   02/09/18 1311   02/09/18 1311  For home use only DME Shower stool  Once     02/09/18 1311     No Known Allergies Follow-up Information    Harvie Junior, MD Follow up.   Specialty:  Family Medicine Why:  Please schedule follow up within the next few days Contact information: [place] [address] [phone]        [facility] Follow up.   Specialty:  Home Health Services Why:  HHRN, HHPT, [place], [place], [place], [facility] social worker Contact information: [address] [place] [address] [place] Follow up.   Why:  tub seat/rolling walker  Contact information: [address] [place] [address] [phone]            The results of significant diagnostics from this hospitalization (including imaging, microbiology, ancillary and laboratory) are listed below for reference.    Significant Diagnostic Studies: Dg Chest 2 View  Result Date: 02/06/2018 CLINICAL DATA:  Hypoxia, history of hypertension, COPD is seizures. Tobacco use since age 83. EXAM: CHEST - 2 VIEW COMPARISON:  07/01/2017 FINDINGS: Emphysematous hyperinflation of the lungs with new right small to moderate pleural effusion. Reverse cephalization and engorgement of pulmonary vasculature to the lower lobes likely on the basis of COPD. Slightly more confluent airspace opacity at the right lung base cannot exclude the possibility of pneumonia. Heart and mediastinal contours are within normal limits. Remote right-sided sixth rib fracture. IMPRESSION: COPD with new small to moderate sized right pleural effusion with reversed cephalization of pulmonary vasculature to the lower lobes  likely on the basis of upper lobe COPD. More confluent opacities at the right lung base raise concern for right lower lobe pneumonia and/or atelectasis. Electronically Signed   By: Ashley Royalty M.D.   On: 02/06/2018 17:53   Ct Head Wo Contrast  Result Date: 02/06/2018 CLINICAL DATA:  [place] this afternoon striking head, loss  of consciousness, history of tonsillar cancer, COPD, GERD, hypertension, smoker EXAM: CT HEAD WITHOUT CONTRAST CT CERVICAL SPINE WITHOUT CONTRAST TECHNIQUE: Multidetector CT imaging of the head and cervical spine was performed following the standard protocol without intravenous contrast. Multiplanar CT image reconstructions of the cervical spine were also generated. COMPARISON:  CT soft tissue neck 04/23/2016 FINDINGS: CT HEAD FINDINGS Brain: Normal ventricular morphology. No midline shift or mass effect. Normal appearance of brain parenchyma. No intracranial hemorrhage, mass lesion, or evidence of acute infarction. No extra-axial fluid collections. Vascular: Mild atherosclerotic calcification of internal carotid arteries bilaterally at skull base Skull: Calvaria intact Sinuses/Orbits: Mucosal thickening LEFT maxillary sinus. Remaining visualized paranasal sinuses clear Other: N/A CT CERVICAL SPINE FINDINGS Alignment: Normal Skull base and vertebrae: Osseous mineralization normal. Visualized skull base intact. Vertebral body and disc space heights maintained. No fracture, subluxation or bone destruction. Soft tissues and spinal canal: Prevertebral soft tissues are mildly prominent with fluid versus mucous in the hypopharynx. Scattered atherosclerotic calcifications of the carotid and vertebral arteries. Diffuse infiltration of soft tissue planes in the parapharyngeal region, which may be due to prior radiation therapy or tumor, suboptimally assessed by noncontrast exam, appears more pronounced than on priors contrast-enhanced CT soft tissue neck study. Narrowing of airway at the larynx and  supraglottic region. Suboptimal assessment for adenopathy due to loss of tissue planes. Disc levels: Broad-based disc herniation at C6-C7 indenting thecal sac. Upper chest: Lung apices appear emphysematous but clear. Other: N/A IMPRESSION: No acute intracranial abnormalities.1 No acute cervical spine abnormalities. Emphysematous changes at lung apices. Fluid/mucous within the hypopharynx with narrowing of the airway at the level of the larynx and supraglottic region. Diffuse infiltration of soft tissue planes in the cervical region which may be related to prior radiation therapy for tonsillar cancer though recurrent tumor could cause a similar appearance; recommend follow-up dedicated CT soft tissue neck imaging with contrast when clinical condition permits to further assess. Electronically Signed   By: Lavonia Dana M.D.   On: 02/06/2018 17:35   Ct Soft Tissue Neck W Contrast  Result Date: 02/09/2018 CLINICAL DATA:  61 y/o M; history of tonsillar cancer post radiation therapy. EXAM: CT NECK WITH CONTRAST TECHNIQUE: Multidetector CT imaging of the neck was performed using the standard protocol following the bolus administration of intravenous contrast. CONTRAST:  148mL OMNIPAQUE IOHEXOL 300 MG/ML  SOLN COMPARISON:  04/23/2016 CT of the neck. 02/06/2018 CT of cervical spine. FINDINGS: Pharynx and larynx: No nodular mucosal enhancement of the oropharynx or tonsils to suggest recurrent disease. Mild smooth low-attenuation mucosal thickening of the oropharynx compatible chronic post radiation changes. No new exophytic mass or abnormal nodular enhancement of the aerodigestive tract. Salivary glands: Left submandibular gland resection. No inflammation, mass, or stone of residual salivary glands. Thyroid: Normal. Lymph nodes: Left neck dissection. No mass within the resection bed identified. No lymphadenopathy by imaging criteria, lymph node necrosis, or abnormal enhancement. Vascular: Diffuse irregularity of the carotid  systems multiple areas of mild less than 50% stenosis compatible with atherosclerotic and post radiation changes. 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 Limited intracranial: Negative. Visualized orbits: Negative. Mastoids and visualized paranasal sinuses: Moderate left maxillary sinus mucosal thickening, small fluid level, and chronic inflammatory changes of the wall of the sinus. Paranasal sinuses and mastoid air cells are otherwise normally aerated. Skeleton: Mild cervical spondylosis. No high-grade bony canal stenosis. No lytic or blastic lesion. Upper chest: Moderate emphysema.  Small right pleural effusion. Other: None. IMPRESSION: 1. Stable post treatment  changes in the neck. No evidence of recurrent disease. No lymphadenopathy. 2. Stable chronic findings as above. Electronically Signed   By: Kristine Garbe M.D.   On: 02/09/2018 18:39   Ct Angio Chest Pe W Or Wo Contrast  Result Date: 02/07/2018 CLINICAL DATA:  62 year old male with shortness of breath. History of pancreatitis and throat cancer. Weight loss. EXAM: CT ANGIOGRAPHY CHEST CT ABDOMEN AND PELVIS WITH CONTRAST TECHNIQUE: Multidetector CT imaging of the chest was performed using the standard protocol during bolus administration of intravenous contrast. Multiplanar CT image reconstructions and MIPs were obtained to evaluate the vascular anatomy. Multidetector CT imaging of the abdomen and pelvis was performed using the standard protocol during bolus administration of intravenous contrast. CONTRAST:  57mL ISOVUE-370 IOPAMIDOL (ISOVUE-370) INJECTION 76% COMPARISON:  CT of the abdomen pelvis dated 07/11/2016 and radiograph dated 03/10/2017 FINDINGS: Evaluation is limited due to streak artifact caused by patient's arms and overlying metallic support device. Evaluation is also limited due to anasarca and cachexia. CTA CHEST FINDINGS Cardiovascular: There is mild cardiomegaly. No pericardial effusion. There is coronary  vascular calcification primarily involving the LAD. The aorta is poorly visualized and suboptimally opacified but grossly unremarkable. Evaluation of the pulmonary arteries is limited due to suboptimal enhancement of the peripheral branches. Apparent areas of decreased enhancement in the periphery of the distal branches of the right lower lobe (series 9 images 186-191) likely artifactual and related to volume averaging artifact with adjacent bronchi as well as poor contrast opacification. No definite CT evidence of pulmonary embolism. Mediastinum/Nodes: There is fullness of the right hilum measuring 15 mm in short axis. Ill-defined soft tissue density extending from the hila along the bronchi are not well evaluated but likely thickened bronchial wall with endobronchial debris. Evaluation of the mediastinum and hila is very limited. No mediastinal fluid collection. The esophagus is not well visualized. Lungs/Pleura: There are extensive consolidative changes of the lung bases and lower lobes progressed compared to the prior CT. There is diffuse areas of nodularity is at the lung bases. Diffuse thickening of the bronchial walls with mucous impaction involving the lower lobes. There is mucous impaction of the right lower lumbar bronchus. There is narrowing of the central bronchi likely secondary to edema. There is a small to moderate right and trace left pleural effusion. There is a background of emphysema. Minimal amount of air in the left apical pleural surface noted, appears chronic and seen on the CT of the cervical spine dated 02/06/2018. No pneumothorax. Musculoskeletal: Diffuse soft tissue edema and anasarca. There is loss of subcutaneous fat and cachexia. No acute osseous pathology. Extensive right chest wall venous collaterals possibly related to a degree of SVC occlusion. Review of the MIP images confirms the above findings. CT ABDOMEN and PELVIS FINDINGS No intra-abdominal free air or free fluid.  Hepatobiliary: The liver is grossly unremarkable. No intrahepatic biliary ductal dilatation. Cholecystectomy. Pancreas: Changes of chronic pancreatitis with dystrophic calcification and dilatation of the main pancreatic duct. Evaluation of the pancreas is limited due to suboptimal visualization. Spleen: Normal in size without focal abnormality. Adrenals/Urinary Tract: The adrenal glands are unremarkable. Small right renal cyst. The kidneys, kidneys and urinary bladder appear unremarkable. Stomach/Bowel: Oral contrast mixed with stool noted throughout the colon. There is no bowel dilatation or evidence of obstruction. The appendix is not identified with certainty. Vascular/Lymphatic: Extensive aortoiliac atherosclerotic disease. No portal venous gas. Evaluation for adenopathy is limited due to diffuse edema and anasarca as well as paucity of abdominal fat. Reproductive: The  prostate and seminal vesicles are grossly unremarkable. Other: Diffuse edema and anasarca as well as cachexia. Musculoskeletal: No acute osseous pathology.  Osteopenia. Review of the MIP images confirms the above findings. IMPRESSION: 1. No definite CT evidence of pulmonary embolism. 2. Diffuse thickening of the bronchial wall with endobronchial mucous content predominantly involving the lower lobes. Large areas of airspace opacity involving the lower lobes and lung bases bilaterally, right greater left most consistent with pneumonia possibly related to aspiration. Clinical correlation is recommended. Bilateral pleural effusions, right greater left. 3. No definite acute intra-abdominal or pelvic pathology. 4. Diffuse soft tissue edema and anasarca.  Cachexia. Electronically Signed   By: Anner Crete M.D.   On: 02/07/2018 02:40   Ct Cervical Spine Wo Contrast  Result Date: 02/06/2018 CLINICAL DATA:  [place] this afternoon striking head, loss of consciousness, history of tonsillar cancer, COPD, GERD, hypertension, smoker EXAM: CT HEAD WITHOUT  CONTRAST CT CERVICAL SPINE WITHOUT CONTRAST TECHNIQUE: Multidetector CT imaging of the head and cervical spine was performed following the standard protocol without intravenous contrast. Multiplanar CT image reconstructions of the cervical spine were also generated. COMPARISON:  CT soft tissue neck 04/23/2016 FINDINGS: CT HEAD FINDINGS Brain: Normal ventricular morphology. No midline shift or mass effect. Normal appearance of brain parenchyma. No intracranial hemorrhage, mass lesion, or evidence of acute infarction. No extra-axial fluid collections. Vascular: Mild atherosclerotic calcification of internal carotid arteries bilaterally at skull base Skull: Calvaria intact Sinuses/Orbits: Mucosal thickening LEFT maxillary sinus. Remaining visualized paranasal sinuses clear Other: N/A CT CERVICAL SPINE FINDINGS Alignment: Normal Skull base and vertebrae: Osseous mineralization normal. Visualized skull base intact. Vertebral body and disc space heights maintained. No fracture, subluxation or bone destruction. Soft tissues and spinal canal: Prevertebral soft tissues are mildly prominent with fluid versus mucous in the hypopharynx. Scattered atherosclerotic calcifications of the carotid and vertebral arteries. Diffuse infiltration of soft tissue planes in the parapharyngeal region, which may be due to prior radiation therapy or tumor, suboptimally assessed by noncontrast exam, appears more pronounced than on priors contrast-enhanced CT soft tissue neck study. Narrowing of airway at the larynx and supraglottic region. Suboptimal assessment for adenopathy due to loss of tissue planes. Disc levels: Broad-based disc herniation at C6-C7 indenting thecal sac. Upper chest: Lung apices appear emphysematous but clear. Other: N/A IMPRESSION: No acute intracranial abnormalities.1 No acute cervical spine abnormalities. Emphysematous changes at lung apices. Fluid/mucous within the hypopharynx with narrowing of the airway at the level  of the larynx and supraglottic region. Diffuse infiltration of soft tissue planes in the cervical region which may be related to prior radiation therapy for tonsillar cancer though recurrent tumor could cause a similar appearance; recommend follow-up dedicated CT soft tissue neck imaging with contrast when clinical condition permits to further assess. Electronically Signed   By: Lavonia Dana M.D.   On: 02/06/2018 17:35   Ct Abdomen Pelvis W Contrast  Result Date: 02/07/2018 CLINICAL DATA:  62 year old male with shortness of breath. History of pancreatitis and throat cancer. Weight loss. EXAM: CT ANGIOGRAPHY CHEST CT ABDOMEN AND PELVIS WITH CONTRAST TECHNIQUE: Multidetector CT imaging of the chest was performed using the standard protocol during bolus administration of intravenous contrast. Multiplanar CT image reconstructions and MIPs were obtained to evaluate the vascular anatomy. Multidetector CT imaging of the abdomen and pelvis was performed using the standard protocol during bolus administration of intravenous contrast. CONTRAST:  40mL ISOVUE-370 IOPAMIDOL (ISOVUE-370) INJECTION 76% COMPARISON:  CT of the abdomen pelvis dated 07/11/2016 and radiograph dated 03/10/2017 FINDINGS:  Evaluation is limited due to streak artifact caused by patient's arms and overlying metallic support device. Evaluation is also limited due to anasarca and cachexia. CTA CHEST FINDINGS Cardiovascular: There is mild cardiomegaly. No pericardial effusion. There is coronary vascular calcification primarily involving the LAD. The aorta is poorly visualized and suboptimally opacified but grossly unremarkable. Evaluation of the pulmonary arteries is limited due to suboptimal enhancement of the peripheral branches. Apparent areas of decreased enhancement in the periphery of the distal branches of the right lower lobe (series 9 images 186-191) likely artifactual and related to volume averaging artifact with adjacent bronchi as well as poor  contrast opacification. No definite CT evidence of pulmonary embolism. Mediastinum/Nodes: There is fullness of the right hilum measuring 15 mm in short axis. Ill-defined soft tissue density extending from the hila along the bronchi are not well evaluated but likely thickened bronchial wall with endobronchial debris. Evaluation of the mediastinum and hila is very limited. No mediastinal fluid collection. The esophagus is not well visualized. Lungs/Pleura: There are extensive consolidative changes of the lung bases and lower lobes progressed compared to the prior CT. There is diffuse areas of nodularity is at the lung bases. Diffuse thickening of the bronchial walls with mucous impaction involving the lower lobes. There is mucous impaction of the right lower lumbar bronchus. There is narrowing of the central bronchi likely secondary to edema. There is a small to moderate right and trace left pleural effusion. There is a background of emphysema. Minimal amount of air in the left apical pleural surface noted, appears chronic and seen on the CT of the cervical spine dated 02/06/2018. No pneumothorax. Musculoskeletal: Diffuse soft tissue edema and anasarca. There is loss of subcutaneous fat and cachexia. No acute osseous pathology. Extensive right chest wall venous collaterals possibly related to a degree of SVC occlusion. Review of the MIP images confirms the above findings. CT ABDOMEN and PELVIS FINDINGS No intra-abdominal free air or free fluid. Hepatobiliary: The liver is grossly unremarkable. No intrahepatic biliary ductal dilatation. Cholecystectomy. Pancreas: Changes of chronic pancreatitis with dystrophic calcification and dilatation of the main pancreatic duct. Evaluation of the pancreas is limited due to suboptimal visualization. Spleen: Normal in size without focal abnormality. Adrenals/Urinary Tract: The adrenal glands are unremarkable. Small right renal cyst. The kidneys, kidneys and urinary bladder appear  unremarkable. Stomach/Bowel: Oral contrast mixed with stool noted throughout the colon. There is no bowel dilatation or evidence of obstruction. The appendix is not identified with certainty. Vascular/Lymphatic: Extensive aortoiliac atherosclerotic disease. No portal venous gas. Evaluation for adenopathy is limited due to diffuse edema and anasarca as well as paucity of abdominal fat. Reproductive: The prostate and seminal vesicles are grossly unremarkable. Other: Diffuse edema and anasarca as well as cachexia. Musculoskeletal: No acute osseous pathology.  Osteopenia. Review of the MIP images confirms the above findings. IMPRESSION: 1. No definite CT evidence of pulmonary embolism. 2. Diffuse thickening of the bronchial wall with endobronchial mucous content predominantly involving the lower lobes. Large areas of airspace opacity involving the lower lobes and lung bases bilaterally, right greater left most consistent with pneumonia possibly related to aspiration. Clinical correlation is recommended. Bilateral pleural effusions, right greater left. 3. No definite acute intra-abdominal or pelvic pathology. 4. Diffuse soft tissue edema and anasarca.  Cachexia. Electronically Signed   By: Anner Crete M.D.   On: 02/07/2018 02:40   Dg Chest Port 1 View  Result Date: 02/14/2018 CLINICAL DATA:  Acute onset of respiratory distress. EXAM: PORTABLE CHEST 1 VIEW  COMPARISON:  Chest radiograph performed 02/13/2018, and CTA of the chest performed 02/07/2018 FINDINGS: The lungs are well-aerated. Small to moderate right and small left pleural effusions are again noted. Bibasilar airspace opacities are concerning for pneumonia given the appearance on prior CTA, perhaps slightly worsened from the prior study. No pneumothorax is seen. The cardiomediastinal silhouette is within normal limits. No acute osseous abnormalities are seen. IMPRESSION: Small to moderate right and small left pleural effusions again noted. Bibasilar  airspace opacities are concerning for pneumonia given the appearance on prior CTA, perhaps slightly worsened from the prior study. Electronically Signed   By: Garald Balding M.D.   On: 02/14/2018 06:23   Dg Chest Port 1 View  Result Date: 02/13/2018 CLINICAL DATA:  Respiratory failure EXAM: PORTABLE CHEST 1 VIEW COMPARISON:  02/12/2018 FINDINGS: Cardiac shadow is stable. Endotracheal tube and nasogastric catheter have been removed in the interval. Some slight increase in right pleural effusion is noted although this may be positional in nature. Small left pleural effusion is again noted. Bibasilar infiltrates are seen stable from the prior exam. IMPRESSION: No significant interval change from the prior study. Electronically Signed   By: Inez Catalina M.D.   On: 02/13/2018 07:10   Dg Chest Port 1 View  Result Date: 02/12/2018 CLINICAL DATA:  Respiratory failure, history tonsillar cancer, COPD, GERD, smoker EXAM: PORTABLE CHEST 1 VIEW COMPARISON:  Portable exam 0704 hours compared to 02/11/2018 FINDINGS: Tip of endotracheal tube is at the level of the aortic arch, likely above carina though the carina is not adequately visualized. Nasogastric tube extends into stomach. Normal heart size, mediastinal contours, and pulmonary vascularity. Emphysematous changes with bibasilar infiltrates and pleural effusions. Upper lungs clear. No pneumothorax. IMPRESSION: Bibasilar pulmonary infiltrates, slightly improved. Persistent bibasilar pleural effusions. Underlying emphysematous changes. Electronically Signed   By: Lavonia Dana M.D.   On: 02/12/2018 08:15   Dg Chest Port 1 View  Result Date: 02/11/2018 CLINICAL DATA:  62 y/o  M; ET tube and OG tube. EXAM: PORTABLE CHEST 1 VIEW COMPARISON:  02/06/2018 chest radiograph.  02/07/2018 CT chest. FINDINGS: Stable cardiac silhouette given projection and technique. Small right larger than left pleural effusions and bibasilar consolidations. Endotracheal tube tip 4.6 cm above  the carina. Enteric tube tip projects over gastric body. Right upper quadrant surgical clips, presumably cholecystectomy. Bones are unremarkable. IMPRESSION: 1. Endotracheal tube tip 4.6 cm above the carina. Enteric tube tip projects over gastric body. 2. Stable small right larger than left pleural effusions and bibasilar consolidations of the lungs. Electronically Signed   By: Kristine Garbe M.D.   On: 02/11/2018 02:02   Dg Swallowing Func-speech Pathology  Result Date: 02/15/2018 Objective Swallowing Evaluation: Type of Study: MBS-Modified Barium Swallow Study  Patient Details Name: Maciah Schweigert MRN: [id number] Date of Birth: [birth year] Today's Date: 02/15/2018 Time: SLP Start Time (ACUTE ONLY): 7371 -SLP Stop Time (ACUTE ONLY): 1143 SLP Time Calculation (min) (ACUTE ONLY): 24 min Past Medical History: Past Medical History: Diagnosis Date . Anemia  . Arthritis  . Cervical adenopathy 04/26/2012 . Chronic back pain   lower . Chronic pancreatitis ([place])  . Constipation  . COPD (chronic obstructive pulmonary disease) ([place])  . Daily headache 05/23/2012  "small ones" . Deficiency anemia 04/15/2016 . Dyspnea   with exertion . G tube feedings (HCC)   hx of  feeding tube in stomach . GERD (gastroesophageal reflux disease)  . History of blood transfusion ~ 2004  "from the pancreatitis" . History of radiation therapy 02/02/2011-03/22/2011  head/neck,left tonsil . Hx of radiation therapy 02/02/11 to 03/22/11  L tonsil . Hypertension  . Insomnia  . 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 Pancytopenia ([place]) 02/28/2014 . Poor venous access 04/28/2016 . 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 Thrush 11/02/2013 . 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 Urinary hesitancy  Past Surgical History: Past Surgical History: Procedure Laterality Date . BILE DUCT STENT PLACEMENT    hx of . CHOLECYSTECTOMY  04/2005 . DIRECT LARYNGOSCOPY  05/23/2012  Procedure: DIRECT LARYNGOSCOPY;  Surgeon:  Rozetta Nunnery, MD;  Location: [place];  Service: ENT;  Laterality: N/A; . DIRECT LARYNGOSCOPY N/A 03/23/2013  Procedure: DIRECT LARYNGOSCOPY;  Surgeon: Rozetta Nunnery, MD;  Location: [place];  Service: ENT;  Laterality: N/A; . ESOPHAGEAL DILATION N/A 03/23/2013  Procedure: ESOPHAGEAL DILATION;  Surgeon: Rozetta Nunnery, MD;  Location: [place];  Service: ENT;  Laterality: N/A; . ESOPHAGOGASTRODUODENOSCOPY (EGD) WITH PROPOFOL N/A 03/01/2017  Procedure: ESOPHAGOGASTRODUODENOSCOPY (EGD) WITH PROPOFOL;  Surgeon: Ladene Artist, MD;  [facility];  Service: Endoscopy;  Laterality: N/A; . FLEXIBLE SIGMOIDOSCOPY N/A 03/01/2017  Procedure: FLEXIBLE SIGMOIDOSCOPY;  Surgeon: Ladene Artist, MD;  [facility];  Service: Endoscopy;  Laterality: N/A; . IR GENERIC HISTORICAL  04/29/2016  IR US GUIDE VASC ACCESS RIGHT 04/29/2016 [facility] . IR GENERIC HISTORICAL  04/29/2016  IR FLUORO GUIDE CV LINE RIGHT 04/29/2016 [facility] . IR GENERIC HISTORICAL  05/12/2016  IR US GUIDE VASC ACCESS LEFT 05/12/2016 [facility] . IR GENERIC HISTORICAL  05/12/2016  IR FLUORO GUIDE CV LINE LEFT 05/12/2016 [facility] . IR GENERIC HISTORICAL  06/28/2016  IR US GUIDE VASC ACCESS RIGHT 06/28/2016 Sandi Mariscal, MD [facility] . IR GENERIC HISTORICAL  06/28/2016  IR FLUORO GUIDE PORT INSERTION RIGHT 06/28/2016 Sandi Mariscal, MD [facility] . IR REMOVAL TUN ACCESS W/ PORT W/O FL MOD SED  02/10/2017 . MASS BIOPSY  05/23/2012  Procedure: NECK MASS BIOPSY;  Surgeon: Rozetta Nunnery, MD;  Location: [place];  Service: ENT;  Laterality: N/A; . RADICAL NECK DISSECTION  05/23/2012  w/mass excision . RADICAL NECK DISSECTION  05/23/2012  Procedure: RADICAL NECK DISSECTION;  Surgeon: Rozetta Nunnery, MD;  Location: [place];  Service: ENT;  Laterality: Left; . TIBIA FRACTURE SURGERY  1990's  left HPI: Pt is a 62 y.o. male admitted s/p fall (questionable LOC); CXR showed RLL PNA, CT Head negative for  acute changes. Pt had a change in status requiring intubation 5/11-5/12. 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   Subjective: pt says he has been trying to spit up phlegm from his throat all morning Assessment / Plan / Recommendation CHL IP CLINICAL IMPRESSIONS 02/15/2018 Clinical Impression Pt's pharyngeal dysphagia has worsened since initial MBS 5/8, likely due to further loss of functional reserve as pt is globally more deconditioned. He now has an increased amount of residue that remains in the valleculae, posterior pharyngeal wall, and pyriform sinuses, which he penetrates after the swallow even with purees - which did not enter his airway on the last study. Pt cannot completely expel penetrates of nectar thick liquids or large boluses of honey thick liquids. SLP provided Min cues for effortful  swallows and immediate coughs to better protect the airway and reduce residue with purees and honey thick liquids in small boluses. Recommend Dys 1 diet and honey thick liquids by spoon only, using multiple hard swallows and coughs per bolus. This will likely be an effortful means of getting in nutrition and he remains at risk for dehyrdation/malnutrition. Pt has a chronic risk for aspiration that can be reduced but not eliminated. Continue to recommend additional conversations about [place] with MD.  SLP Visit Diagnosis Dysphagia, pharyngoesophageal phase (R13.14) Attention and concentration deficit following -- Frontal lobe and executive function deficit following -- Impact on safety and function Severe aspiration risk;Moderate aspiration risk;Risk for inadequate nutrition/hydration   CHL IP TREATMENT  RECOMMENDATION 02/15/2018 Treatment Recommendations Therapy as outlined in treatment plan below   Prognosis 02/15/2018 Prognosis for Safe Diet Advancement Guarded Barriers to Reach Goals Time post onset;Severity of deficits Barriers/Prognosis Comment -- CH[facility] IP DIET RECOMMENDATION 02/15/2018 SLP Diet Recommendations Dysphagia 1 (Puree) solids;Honey thick liquids Liquid Administration via Spoon Medication Administration Crushed with puree Compensations Slow rate;Small sips/bites;Hard cough after swallow;Multiple dry swallows after each bite/sip Postural Changes Seated upright at 90 degrees;Remain semi-upright after after feeds/meals (Comment)   CHL IP OTHER RECOMMENDATIONS 02/15/2018 Recommended Consults -- Oral Care Recommendations Oral care QID Other Recommendations Have oral suction available;Order thickener from pharmacy;Prohibited food (jello, ice cream, thin soups);Remove water pitcher   CHL IP FOLLOW UP RECOMMENDATIONS 02/15/2018 Follow up Recommendations Skilled Nursing facility   [facility] IP FREQUENCY AND DURATION 02/15/2018 Speech Therapy Frequency (ACUTE ONLY) min 2x/week Treatment Duration 2 weeks      CHL IP ORAL PHASE 02/15/2018 Oral Phase WFL Oral - Pudding Teaspoon -- Oral - Pudding Cup -- Oral - Honey Teaspoon -- Oral - Honey Cup -- Oral - Nectar Teaspoon -- Oral - Nectar Cup -- Oral - Nectar Straw -- Oral - Thin Teaspoon -- Oral - Thin Cup -- Oral - Thin Straw -- Oral - Puree -- Oral - Mech Soft -- Oral - Regular -- Oral - Multi-Consistency -- Oral - Pill -- Oral Phase - Comment --  CHL IP PHARYNGEAL PHASE 02/15/2018 Pharyngeal Phase Impaired Pharyngeal- Pudding Teaspoon -- Pharyngeal -- Pharyngeal- Pudding Cup -- Pharyngeal -- Pharyngeal- Honey Teaspoon Delayed swallow initiation-vallecula;Reduced pharyngeal peristalsis;Reduced epiglottic inversion;Reduced anterior laryngeal mobility;Reduced laryngeal elevation;Reduced airway/laryngeal closure;Reduced tongue base retraction;Pharyngeal residue -  valleculae;Pharyngeal residue - pyriform;Pharyngeal residue - posterior pharnyx;Penetration/Apiration after swallow Pharyngeal Material enters airway, remains ABOVE vocal cords and not ejected out Pharyngeal- Honey Cup Delayed swallow initiation-vallecula;Reduced pharyngeal peristalsis;Reduced epiglottic inversion;Reduced anterior laryngeal mobility;Reduced laryngeal elevation;Reduced airway/laryngeal closure;Reduced tongue base retraction;Pharyngeal residue - valleculae;Pharyngeal residue - pyriform;Pharyngeal residue - posterior pharnyx;Penetration/Apiration after swallow Pharyngeal Material enters airway, remains ABOVE vocal cords and not ejected out Pharyngeal- Nectar Teaspoon Delayed swallow initiation-vallecula;Reduced pharyngeal peristalsis;Reduced epiglottic inversion;Reduced anterior laryngeal mobility;Reduced laryngeal elevation;Reduced airway/laryngeal closure;Reduced tongue base retraction;Pharyngeal residue - valleculae;Pharyngeal residue - pyriform;Pharyngeal residue - posterior pharnyx;Penetration/Aspiration during swallow;Penetration/Apiration after swallow Pharyngeal -- Pharyngeal- Nectar Cup NT Pharyngeal -- Pharyngeal- Nectar Straw -- Pharyngeal -- Pharyngeal- Thin Teaspoon NT Pharyngeal -- Pharyngeal- Thin Cup NT Pharyngeal -- Pharyngeal- Thin Straw -- Pharyngeal -- Pharyngeal- Puree Delayed swallow initiation-vallecula;Reduced pharyngeal peristalsis;Reduced epiglottic inversion;Reduced anterior laryngeal mobility;Reduced laryngeal elevation;Reduced airway/laryngeal closure;Reduced tongue base retraction;Pharyngeal residue - valleculae;Pharyngeal residue - pyriform;Pharyngeal residue - posterior pharnyx;Penetration/Apiration after swallow Pharyngeal Material enters airway, remains ABOVE vocal cords and not ejected out Pharyngeal- Mechanical Soft -- Pharyngeal -- Pharyngeal- Regular -- Pharyngeal -- Pharyngeal- Multi-consistency -- Pharyngeal -- Pharyngeal- Pill -- Pharyngeal -- Pharyngeal Comment  --  CHL IP CERVICAL ESOPHAGEAL PHASE 02/15/2018 Cervical Esophageal Phase Impaired Pudding Teaspoon -- Pudding Cup -- Honey Teaspoon Reduced cricopharyngeal relaxation Honey Cup Reduced cricopharyngeal relaxation Nectar Teaspoon Reduced cricopharyngeal relaxation Nectar Cup NT Nectar Straw -- Thin Teaspoon NT Thin Cup NT Thin Straw -- Puree Reduced cricopharyngeal relaxation Mechanical Soft -- Regular -- Multi-consistency -- Pill -- Cervical Esophageal Comment -- No flowsheet data found. [name] 02/15/2018, 2:40 PM  [name], M.A. CCC-SLP [phone]             Dg Swallowing Func-speech Pathology  Result Date: 02/08/2018 Objective Swallowing Evaluation: Type of Study: MBS-Modified Barium Swallow Study  Patient Details Name: Jamare Vanatta MRN: [id number] Date of Birth: Feb 08, 1956 Today's Date: 02/08/2018 Time: SLP Start Time (ACUTE ONLY): 1345 -SLP Stop Time (ACUTE ONLY): 1407 SLP Time Calculation (min) (ACUTE ONLY): 22 min Past Medical History: Past Medical History: Diagnosis Date . Anemia  . Arthritis  . Cervical adenopathy 04/26/2012 . Chronic back pain   lower . Chronic pancreatitis ([place])  . Constipation  . COPD (chronic obstructive pulmonary disease) ([employer])  . Daily headache 05/23/2012  "small ones" . Deficiency anemia 04/15/2016 . Dyspnea   with exertion . G tube feedings (HCC)   hx of  feeding tube in stomach . GERD (gastroesophageal reflux disease)  . History of blood transfusion ~ 2004  "from the pancreatitis" . History of radiation therapy 02/02/2011-03/22/2011  head/neck,left tonsil . Hx of radiation therapy 02/02/11 to 03/22/11  L tonsil . Hypertension  . Insomnia  . 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 Pancytopenia ([place]) 02/28/2014 . Poor venous access 04/28/2016 . 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 Thrush 11/02/2013 . 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 Urinary hesitancy  Past Surgical History: Past Surgical History: Procedure Laterality  Date . BILE DUCT STENT PLACEMENT    hx of . CHOLECYSTECTOMY  04/2005 . DIRECT LARYNGOSCOPY  05/23/2012  Procedure: DIRECT LARYNGOSCOPY;  Surgeon: Rozetta Nunnery, MD;  Location: [place];  Service: ENT;  Laterality: N/A; . DIRECT LARYNGOSCOPY N/A 03/23/2013  Procedure: DIRECT LARYNGOSCOPY;  Surgeon: Rozetta Nunnery, MD;  Location: [place];  Service: ENT;  Laterality: N/A; . ESOPHAGEAL DILATION N/A 03/23/2013  Procedure: ESOPHAGEAL DILATION;  Surgeon: Rozetta Nunnery, MD;  Location: [place];  Service: ENT;  Laterality: N/A; . ESOPHAGOGASTRODUODENOSCOPY (EGD) WITH PROPOFOL N/A 03/01/2017  Procedure: ESOPHAGOGASTRODUODENOSCOPY (EGD) WITH PROPOFOL;  Surgeon: Ladene Artist, MD;  [facility];  Service: Endoscopy;  Laterality: N/A; . FLEXIBLE SIGMOIDOSCOPY N/A 03/01/2017  Procedure: FLEXIBLE SIGMOIDOSCOPY;  Surgeon: Ladene Artist, MD;  [facility];  Service: Endoscopy;  Laterality: N/A; . IR GENERIC HISTORICAL  04/29/2016  IR US GUIDE VASC ACCESS RIGHT 04/29/2016 [facility] . IR GENERIC HISTORICAL  04/29/2016  IR FLUORO GUIDE CV LINE RIGHT 04/29/2016 [facility] . IR GENERIC HISTORICAL  05/12/2016  IR US GUIDE VASC ACCESS LEFT 05/12/2016 [facility] . IR GENERIC HISTORICAL  05/12/2016  IR FLUORO GUIDE CV LINE LEFT 05/12/2016 [facility] . IR GENERIC HISTORICAL  06/28/2016  IR US GUIDE VASC ACCESS RIGHT 06/28/2016 Sandi Mariscal, MD [facility] . IR GENERIC HISTORICAL  06/28/2016  IR FLUORO GUIDE PORT INSERTION RIGHT 06/28/2016 Sandi Mariscal, MD [facility] . IR REMOVAL TUN ACCESS W/ PORT W/O FL MOD SED  02/10/2017 . MASS BIOPSY  05/23/2012  Procedure: NECK MASS BIOPSY;  Surgeon: Rozetta Nunnery, MD;  Location: [place];  Service: ENT;  Laterality:  N/A; . RADICAL NECK DISSECTION  05/23/2012  w/mass excision . RADICAL NECK DISSECTION  05/23/2012  Procedure: RADICAL NECK DISSECTION;  Surgeon: Rozetta Nunnery, MD;  Location: [place];  Service: ENT;  Laterality: Left;  . TIBIA FRACTURE SURGERY  1990's  left HPI: Pt is a 62 y.o. male admitted s/p fall (questionable LOC); CXR showed RLL PNA, CT Head negative for acute changes. 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   Subjective: describes having to puree all his food to get it to go down at home Assessment / Plan / Recommendation CHL IP CLINICAL IMPRESSIONS 02/08/2018 Clinical Impression Pt has a moderate pharyngeal dysphagia suspected to be chronic in nature secondary to history of radiation tx for throat cancer. He has minimal hyolaryngeal excursion, epiglottic deflection, base of tongue retraction, and pharyngeal squeeze, resulting in poor swallow efficiency and limited airway closure. Moderate residue remains at the entrance of the laryngeal vestibule and pyriform sinuses with thin and nectar thick liquids, which are silently penetrated/aspiration during the swallow. His cough is not effective at clearing aspirates, but if he performs a heard cough and re-swallow he can clear most penetrates with cup sips of nectar thick liquids and spoonfuls of thin liquids, as penetration is more shallow. Increasing amounts of residue remain in the valleculae as consistencies become thicker, with honey thick liquids still penetrating into the laryngeal vestibule. Pt is likely at a high risk for aspiration, malnutrition, and dehydration with any consistencies recommended. In light of his acute PNA would favor a more conservative diet of Dys 1 textures and nectar thick liquids by cup using hard swallows, hard coughs, and hard second swallows. Teaspoons of thin water could be trialed between meals after oral care using the same  strategies. MD may also wish to consider conversation about overall GOC given his likely chronic dysphagia. SLP Visit Diagnosis Dysphagia, pharyngoesophageal phase (R13.14) Attention and concentration deficit following -- Frontal lobe and executive function deficit following -- Impact on safety and function Severe aspiration risk;Moderate aspiration risk;Risk for inadequate nutrition/hydration   CHL IP TREATMENT RECOMMENDATION 02/08/2018 Treatment Recommendations Therapy as outlined in treatment plan below   Prognosis 02/08/2018 Prognosis for Safe Diet Advancement Guarded Barriers to Reach Goals Time post onset;Severity of deficits Barriers/Prognosis Comment -- CHL IP DIET RECOMMENDATION 02/08/2018 SLP Diet Recommendations Dysphagia 1 (Puree) solids;Nectar thick liquid Liquid Administration via Cup;No straw Medication Administration Crushed with puree Compensations Slow rate;Small sips/bites;Hard cough after swallow;Multiple dry swallows after each bite/sip Postural Changes Remain semi-upright after after feeds/meals (Comment);Seated upright at 90 degrees   CHL IP OTHER RECOMMENDATIONS 02/08/2018 Recommended Consults -- Oral Care Recommendations Oral care QID Other Recommendations Have oral suction available;Order thickener from pharmacy;Prohibited food (jello, ice cream, thin soups);Remove water pitcher   CHL IP FOLLOW UP RECOMMENDATIONS 02/08/2018 Follow up Recommendations (No Data)   CHL IP FREQUENCY AND DURATION 02/08/2018 Speech Therapy Frequency (ACUTE ONLY) min 2x/week Treatment Duration 2 weeks      CHL IP ORAL PHASE 02/08/2018 Oral Phase WFL Oral - Pudding Teaspoon -- Oral - Pudding Cup -- Oral - Honey Teaspoon -- Oral - Honey Cup -- Oral - Nectar Teaspoon -- Oral - Nectar Cup -- Oral - Nectar Straw -- Oral - Thin Teaspoon -- Oral - Thin Cup --  Oral - Thin Straw -- Oral - Puree -- Oral - Mech Soft -- Oral - Regular -- Oral - Multi-Consistency -- Oral - Pill -- Oral Phase - Comment --  CHL IP PHARYNGEAL PHASE 02/08/2018  Pharyngeal Phase Impaired Pharyngeal- Pudding Teaspoon -- Pharyngeal -- Pharyngeal- Pudding Cup -- Pharyngeal -- Pharyngeal- Honey Teaspoon -- Pharyngeal -- Pharyngeal- Honey Cup Delayed swallow initiation-vallecula;Reduced pharyngeal peristalsis;Reduced epiglottic inversion;Reduced anterior laryngeal mobility;Reduced laryngeal elevation;Reduced airway/laryngeal closure;Reduced tongue base retraction;Pharyngeal residue - valleculae;Penetration/Aspiration during swallow Pharyngeal Material enters airway, remains ABOVE vocal cords and not ejected out Pharyngeal- Nectar Teaspoon -- Pharyngeal -- Pharyngeal- Nectar Cup Reduced pharyngeal peristalsis;Reduced epiglottic inversion;Reduced anterior laryngeal mobility;Reduced laryngeal elevation;Reduced airway/laryngeal closure;Reduced tongue base retraction;Pharyngeal residue - valleculae;Penetration/Aspiration during swallow;Penetration/Aspiration before swallow;Delayed swallow initiation-pyriform sinuses;Compensatory strategies attempted (with [place]ox) Pharyngeal Material enters airway, remains ABOVE vocal cords and not ejected out Pharyngeal- Nectar Straw -- Pharyngeal -- Pharyngeal- Thin Teaspoon Reduced pharyngeal peristalsis;Reduced epiglottic inversion;Reduced anterior laryngeal mobility;Reduced laryngeal elevation;Reduced airway/laryngeal closure;Reduced tongue base retraction;Pharyngeal residue - valleculae;Penetration/Aspiration during swallow;Delayed swallow initiation-vallecula;Compensatory strategies attempted (with notebox) Pharyngeal Material enters airway, remains ABOVE vocal cords and not ejected out Pharyngeal- Thin Cup Reduced pharyngeal peristalsis;Reduced epiglottic inversion;Reduced anterior laryngeal mobility;Reduced laryngeal elevation;Reduced airway/laryngeal closure;Reduced tongue base retraction;Pharyngeal residue - valleculae;Delayed swallow initiation-pyriform sinuses;Penetration/Aspiration before swallow;Penetration/Aspiration during  swallow;Compensatory strategies attempted (with notebox) Pharyngeal Material enters airway, passes BELOW cords without attempt by patient to eject out (silent aspiration) Pharyngeal- Thin Straw -- Pharyngeal -- Pharyngeal- Puree Delayed swallow initiation-vallecula;Reduced pharyngeal peristalsis;Reduced epiglottic inversion;Reduced anterior laryngeal mobility;Reduced laryngeal elevation;Reduced airway/laryngeal closure;Reduced tongue base retraction;Pharyngeal residue - valleculae Pharyngeal -- Pharyngeal- Mechanical Soft -- Pharyngeal -- Pharyngeal- Regular -- Pharyngeal -- Pharyngeal- Multi-consistency -- Pharyngeal -- Pharyngeal- Pill -- Pharyngeal -- Pharyngeal Comment --  CHL IP CERVICAL ESOPHAGEAL PHASE 02/08/2018 Cervical Esophageal Phase Impaired Pudding Teaspoon -- Pudding Cup -- Honey Teaspoon -- Honey Cup Reduced cricopharyngeal relaxation Nectar Teaspoon -- Nectar Cup Reduced cricopharyngeal relaxation Nectar Straw -- Thin Teaspoon Reduced cricopharyngeal relaxation Thin Cup Reduced cricopharyngeal relaxation Thin Straw -- Puree Reduced cricopharyngeal relaxation Mechanical Soft -- Regular -- Multi-consistency -- Pill -- Cervical Esophageal Comment -- No flowsheet data found. [name] 02/08/2018, 3:06 PM  [name], M.A. CCC-SLP [phone]              Microbiology: Recent Results (from the past 240 hour(s))  Culture, sputum-assessment     Status: None   Collection Time: 02/07/18  5:42 PM  Result Value Ref Range Status   Specimen Description EXPECTORATED SPUTUM  Final   Special Requests Normal  Final   Sputum evaluation   Final    THIS SPECIMEN IS ACCEPTABLE FOR SPUTUM CULTURE Performed at [facility], [address] [place], [place] [address]    Report Status 02/08/2018 FINAL  Final  Culture, respiratory (NON-Expectorated)     Status: None   Collection Time: 02/07/18  5:42 PM  Result Value Ref Range Status   Specimen Description EXPECTORATED SPUTUM  Final    Special Requests Normal Reflexed from [id number]  Final   Gram Stain   Final    ABUNDANT WBC PRESENT,BOTH PMN AND MONONUCLEAR MODERATE GRAM POSITIVE COCCI RARE YEAST FEW SQUAMOUS EPITHELIAL CELLS PRESENT Performed at [facility], [place] [address], [place], [place] [address]    Culture   Final    MODERATE METHICILLIN RESISTANT STAPHYLOCOCCUS AUREUS   Report Status 02/10/2018 FINAL  Final   Organism ID, Bacteria METHICILLIN RESISTANT STAPHYLOCOCCUS AUREUS  Final      Susceptibility  Methicillin resistant staphylococcus aureus - MIC*    CIPROFLOXACIN >=8 RESISTANT Resistant     ERYTHROMYCIN >=8 RESISTANT Resistant     GENTAMICIN <=0.5 SENSITIVE Sensitive     OXACILLIN >=4 RESISTANT Resistant     TETRACYCLINE <=1 SENSITIVE Sensitive     VANCOMYCIN <=0.5 SENSITIVE Sensitive     TRIMETH/SULFA <=10 SENSITIVE Sensitive     CLINDAMYCIN <=0.25 SENSITIVE Sensitive     RIFAMPIN <=0.5 SENSITIVE Sensitive     Inducible Clindamycin NEGATIVE Sensitive     * MODERATE METHICILLIN RESISTANT STAPHYLOCOCCUS AUREUS     Labs: Basic Metabolic Panel: Recent Labs  Lab 02/11/18 0044 02/11/18 0727  02/13/18 0803 02/14/18 0353 02/15/18 0351 02/16/18 0538 02/17/18 0544  NA 131* 134*   < > 138 138 139 140 140  K 4.7 5.5*   < > 4.0 3.6 3.1* 3.2* 4.0  CL 90* 94*   < > 107 103 101 100* 99*  CO2 29 26   < > 25 25 28 31  33*  GLUCOSE 238* 80   < > 98 72 72 90 110*  BUN 11 12   < > 8 6 <5* <5* <5*  CREATININE 0.69 0.62   < > 0.52* 0.47* 0.49* 0.50* 0.58*  CALCIUM 8.4* 8.0*   < > 7.7* 7.9* 7.9* 8.0* 8.5*  MG 1.6* 1.9  --  1.4*  --   --   --   --   PHOS 5.4* 2.7  --  3.0 2.4*  --   --   --    < > = values in this interval not displayed.   Liver Function Tests: Recent Labs  Lab 02/11/18 0727 02/12/18 0413 02/14/18 0353  AST 85* 48*  --   ALT 55 47  --   ALKPHOS 52 48  --   BILITOT 1.5* 0.6  --   PROT 5.4* 4.9*  --   ALBUMIN 2.4* 1.9* 2.1*   No results for input(s): LIPASE, AMYLASE in the last  168 hours. No results for input(s): AMMONIA in the last 168 hours. CBC: Recent Labs  Lab 02/12/18 0413 02/13/18 0803 02/14/18 0353 02/15/18 0351 02/16/18 0538  WBC 6.5 9.7 5.5 3.4* 4.0  HGB 8.8* 10.1* 10.1* 10.0* 10.3*  HCT 25.5* 30.8* 29.8* 30.8* 30.6*  MCV 92.1 96.9 97.1 97.2 96.5  PLT 226 275 319 326 322   Cardiac Enzymes: Recent Labs  Lab 02/11/18 0044 02/11/18 0122 02/11/18 0727 02/11/18 1246 02/12/18 0413  CKTOTAL  --  521*  --   --  263  CKMB  --   --   --   --  3.3  TROPONINI 0.13*  --  0.07* 0.05*  --    BNP: BNP (last 3 results) Recent Labs    02/14/18 0353  BNP 495.7*    ProBNP (last 3 results) No results for input(s): PROBNP in the last 8760 hours.  CBG: Recent Labs  Lab 02/16/18 1655 02/16/18 2011 02/17/18 0028 02/17/18 0414 02/17/18 0828  GLUCAP 125* 116* 124* 130* 93       Signed:  Domenic Polite MD.  [facility] 02/17/2018, 9:58 AM

## 2018-02-17 NOTE — Progress Notes (Signed)
Physical Therapy Treatment Patient Details Name: Russell Williamson MRN: [id number] DOB: [birth year] Today's Date: 02/17/2018    History of Present Illness Pt is a 62 y.o. male admitted 02/06/18 post-fall with fever and cough; CXR showed pneumonia. Worked up for CAP. Head CT shows no acute intracranial abnormality. PMH includes throat CA (s/p radiation 2012), OA, cervical adenopathy, chronic LBP, chronic pancreatitis, COPD, HTN, h/o homelessness. On the morning of 5/14, patient had a rapid response with respiratory distress, failed with NRB mask at 15 L, was placed on BiPAP, BiPaP Discontinued 5/14. On 2L [place].    PT Comments    Patient is making progress toward PT goals. Pt required min guard/min A for all mobility and continues to demonstrate limited mobility due to generalized weakness and activity intolerance. Pt with LE weakness and knee buckling noted with increased gait distance and pt fatigued.  Pt with SpO2 desat to 83% on RA at rest. Continue to progress as tolerated.   Follow Up Recommendations  SNF     Equipment Recommendations  Other (comment)(TBA)    Recommendations for Other Services       Precautions / Restrictions Precautions Precautions: Fall Precaution Comments: Watch SpO2 Restrictions Weight Bearing Restrictions: No    Mobility  Bed Mobility Overal bed mobility: Needs Assistance Bed Mobility: Supine to Sit     Supine to sit: HOB elevated;Supervision     General bed mobility comments: increased time and effort; cues to sit for a while prior to standing when feeling dizzy/lightheaded  Transfers Overall transfer level: Needs assistance Equipment used: Rolling walker (2 wheeled) Transfers: Sit to/from Stand Sit to Stand: Min assist         General transfer comment: for balance and safe use of AD; cues for hand placement  Ambulation/Gait Ambulation/Gait assistance: Min assist   Assistive device: Rolling walker (2 wheeled) Gait Pattern/deviations:  Decreased stride length;Step-through pattern;Narrow base of support(leg length discrepancy ) Gait velocity: Decreased   General Gait Details: pt with R LE weakness and knee buckling noted when fatigued   Stairs             Wheelchair Mobility    Modified Rankin (Stroke Patients Only)       Balance Overall balance assessment: Needs assistance Sitting-balance support: Feet supported Sitting balance-Leahy Scale: Fair       Standing balance-Leahy Scale: Poor Standing balance comment: requires UE support                            Cognition Arousal/Alertness: Awake/alert Behavior During Therapy: WFL for tasks assessed/performed Overall Cognitive Status: No family/caregiver present to determine baseline cognitive functioning                                        Exercises      General Comments General comments (skin integrity, edema, etc.): pt with c/o lightheadedness throughout session and with SpO2 desat requiring 4L O2 via [place]      Pertinent Vitals/Pain Pain Assessment: Faces Faces Pain Scale: Hurts little more Pain Location: generalized Pain Descriptors / Indicators: Aching Pain Intervention(s): Monitored during session    Home Living                      Prior Function            PT Goals (current goals can now be  found in the care plan section) Acute Rehab PT Goals PT Goal Formulation: With patient Time For Goal Achievement: 02/21/18 Potential to Achieve Goals: Good Progress towards PT goals: Progressing toward goals    Frequency    Min 3X/week      PT Plan Current plan remains appropriate    Co-evaluation              AM-PAC PT "6 Clicks" Daily Activity  Outcome Measure  Difficulty turning over in bed (including adjusting bedclothes, sheets and blankets)?: A Little Difficulty moving from lying on back to sitting on the side of the bed? : A Lot Difficulty sitting down on and standing up from a  chair with arms (e.g., wheelchair, bedside commode, etc,.)?: Unable Help needed moving to and from a bed to chair (including a wheelchair)?: A Little Help needed walking in hospital room?: A Little Help needed climbing 3-5 steps with a railing? : Total 6 Click Score: 13    End of Session Equipment Utilized During Treatment: Gait belt;Oxygen Activity Tolerance: Patient tolerated treatment well Patient left: with call bell/phone within reach;in chair Nurse Communication: Mobility status PT Visit Diagnosis: Other abnormalities of gait and mobility (R26.89)     Time: 4818-5631 PT Time Calculation (min) (ACUTE ONLY): 30 min  Charges:  $Gait Training: 8-22 mins $Therapeutic Activity: 8-22 mins                    G Codes:       Russell Williamson, PTA Pager: [phone]     Darliss Cheney 02/17/2018, 11:34 AM

## 2018-02-17 NOTE — Care Management Note (Addendum)
Case Management Note  Patient Details  Name: Russell Williamson MRN: [id number] Date of Birth: [birth year]  Subjective/Objective:   Acute respiratory failure with hypoxia.          Action/Plan:  Transition to home with home health services. Pt states has transportation to home.  Expected Discharge Date:  02/17/18               Expected Discharge Plan:  [place]  In-House Referral:  Nutrition, CSW (pt declined SNF)  Discharge planning Services  CM Consult  Post Acute Care Choice:  Durable Medical Equipment, Home Health Choice offered to:  Patient  DME Arranged:  Shower stool, Walker rolling DME Agency:  [facility] Arranged:  RN, PT, Disease Management, OT, Nurse's Aide, Speech Therapy, Social Work [employer] Agency:  [facility]  Status of Service:  Completed, signed off  If discussed at [employer], dates discussed:    Additional Comments:  Sharin Mons, RN 02/17/2018, 12:39 PM

## 2018-02-17 NOTE — Progress Notes (Signed)
MD paged about needing a DME home oxygen order before he is D/C. Also patient's family has still not called back.

## 2018-02-17 NOTE — Progress Notes (Signed)
SATURATION QUALIFICATIONS: (This note is used to comply with regulatory documentation for home oxygen)  Patient Saturations on Room Air at Rest = 83%  Patient Saturations on Room Air while Ambulating = 80%  Patient Saturations on 3 Liters of oxygen while Ambulating = 96%  Please briefly explain why patient needs home oxygen: Patient is desatting when he is off of oxygen specially when ambulating.

## 2018-02-17 NOTE — Progress Notes (Signed)
Patient needs oxygen for home and it won't be provided till tomorrow.

## 2018-02-17 NOTE — Progress Notes (Signed)
CSW received consult regarding PT recommendation of SNF at discharge.  Patient is refusing SNF and states he will return home with home health. RNCM aware.   CSW signing off.   Percell Locus Elzie Sheets LCSW [phone]

## 2018-02-17 NOTE — Progress Notes (Signed)
Patient is still unable to get a hold of his niece and is refusing any other form of transportation.

## 2018-02-18 LAB — GLUCOSE, CAPILLARY
GLUCOSE-CAPILLARY: 107 mg/dL — AB (ref 65–99)
GLUCOSE-CAPILLARY: 138 mg/dL — AB (ref 65–99)
GLUCOSE-CAPILLARY: 79 mg/dL (ref 65–99)
GLUCOSE-CAPILLARY: 81 mg/dL (ref 65–99)

## 2018-02-18 NOTE — Progress Notes (Signed)
Pt given discharge instructions, prescriptions, and care notes. Pt verbalized understanding AEB no further questions or concerns at this time. IV was discontinued, no redness, pain, or swelling noted at this time. Telemetry discontinued and Centralized Telemetry was notified. Pt left the floor via stretcher with PTAR to home.  Pt/s niece was home to let him in and nieces daughter was coming to hospital after 3pm to pick up pts ME.

## 2018-02-18 NOTE — Progress Notes (Signed)
Occupational Therapy Treatment Patient Details Name: Russell Williamson MRN: [id number] DOB: 1955/11/09 Today's Date: 02/18/2018    History of present illness     OT comments  Pt. Seen for review of energy conservation techniques and strategies for increasing safety and independence with ADLS.  Note d/c home later today.    Follow Up Recommendations  SNF;Supervision/Assistance - 24 hour    Equipment Recommendations  Tub/shower seat    Recommendations for Other Services      Precautions / Restrictions Precautions Precautions: Fall Precaution Comments: Watch SpO2       Mobility Bed Mobility                  Transfers                      Balance                                           ADL either performed or assessed with clinical judgement   ADL Overall ADL's : Needs assistance/impaired                                       General ADL Comments: reviewed energy conservation strategies during ADL completion. pt. able to state 2 without cues. i reviewed seated meal prep and grooming tasks.      Vision       Perception     Praxis      Cognition                                                Exercises     Shoulder Instructions       General Comments      Pertinent Vitals/ Pain          Home Living                                          Prior Functioning/Environment              Frequency  Min 2X/week        Progress Toward Goals  OT Goals(current goals can now be found in the care plan section)  Progress towards OT goals: Progressing toward goals     Plan      Co-evaluation                 AM-PAC PT "6 Clicks" Daily Activity     Outcome Measure   Help from another person eating meals?: None Help from another person taking care of personal grooming?: A Little Help from another person toileting, which includes using toliet,  bedpan, or urinal?: A Little Help from another person bathing (including washing, rinsing, drying)?: A Little Help from another person to put on and taking off regular upper body clothing?: A Little Help from another person to put on and taking off regular lower body clothing?: A Little 6 Click Score: 19    End of Session    OT Visit Diagnosis: Other abnormalities of gait and mobility (R26.89);Muscle weakness (  generalized) (M62.81)   Activity Tolerance Patient tolerated treatment well   Patient Left in bed;with call bell/phone within reach   Nurse Communication          Time: 5525-8948 OT Time Calculation (min): 8 min  Charges: OT Treatments $Self Care/Home Management : 8-22 mins   Janice Coffin, COTA/L 02/18/2018, 2:17 PM

## 2018-02-18 NOTE — Care Management (Signed)
Discussed DME O2 with Jermaine from [facility].  Oxygen should be delivered to room this morning.  Pt has all other DME and arrangements with [facility] complete.

## 2018-02-18 NOTE — Progress Notes (Signed)
No changes from DC summary 5/17, DC delayed due to transportation issues and equipment, all issues seem to be sorted out today, plan to transport home by Hughes Better, MD

## 2018-03-20 ENCOUNTER — Other Ambulatory Visit: Payer: Self-pay

## 2018-03-20 ENCOUNTER — Emergency Department (HOSPITAL_COMMUNITY)
Admission: EM | Admit: 2018-03-20 | Discharge: 2018-03-20 | Disposition: A | Discharge status: Home / Self Care | Payer: Medicaid Other | Attending: Emergency Medicine | Admitting: Emergency Medicine

## 2018-03-20 ENCOUNTER — Emergency Department (HOSPITAL_COMMUNITY): Payer: Medicaid Other

## 2018-03-20 ENCOUNTER — Encounter (HOSPITAL_COMMUNITY): Payer: Self-pay | Admitting: *Deleted

## 2018-03-20 DIAGNOSIS — Z79899 Other long term (current) drug therapy: Secondary | ICD-10-CM | POA: Diagnosis not present

## 2018-03-20 DIAGNOSIS — R05 Cough: Secondary | ICD-10-CM

## 2018-03-20 DIAGNOSIS — E039 Hypothyroidism, unspecified: Secondary | ICD-10-CM | POA: Diagnosis not present

## 2018-03-20 DIAGNOSIS — F17210 Nicotine dependence, cigarettes, uncomplicated: Secondary | ICD-10-CM | POA: Insufficient documentation

## 2018-03-20 DIAGNOSIS — I10 Essential (primary) hypertension: Secondary | ICD-10-CM | POA: Diagnosis not present

## 2018-03-20 DIAGNOSIS — J441 Chronic obstructive pulmonary disease with (acute) exacerbation: Secondary | ICD-10-CM | POA: Insufficient documentation

## 2018-03-20 DIAGNOSIS — R059 Cough, unspecified: Secondary | ICD-10-CM

## 2018-03-20 LAB — CBC WITH DIFFERENTIAL/PLATELET
Abs Immature Granulocytes: 0 10*3/uL (ref 0.0–0.1)
Basophils Absolute: 0 10*3/uL (ref 0.0–0.1)
Basophils Relative: 1 %
EOS PCT: 0 %
Eosinophils Absolute: 0 10*3/uL (ref 0.0–0.7)
HEMATOCRIT: 38.3 % — AB (ref 39.0–52.0)
HEMOGLOBIN: 12.4 g/dL — AB (ref 13.0–17.0)
IMMATURE GRANULOCYTES: 0 %
LYMPHS ABS: 0.5 10*3/uL — AB (ref 0.7–4.0)
LYMPHS PCT: 16 %
MCH: 32.8 pg (ref 26.0–34.0)
MCHC: 32.4 g/dL (ref 30.0–36.0)
MCV: 101.3 fL — AB (ref 78.0–100.0)
MONOS PCT: 20 %
Monocytes Absolute: 0.7 10*3/uL (ref 0.1–1.0)
Neutro Abs: 2.1 10*3/uL (ref 1.7–7.7)
Neutrophils Relative %: 63 %
Platelets: 207 10*3/uL (ref 150–400)
RBC: 3.78 MIL/uL — AB (ref 4.22–5.81)
RDW: 17.5 % — ABNORMAL HIGH (ref 11.5–15.5)
WBC: 3.3 10*3/uL — AB (ref 4.0–10.5)

## 2018-03-20 LAB — COMPREHENSIVE METABOLIC PANEL
ALBUMIN: 3.4 g/dL — AB (ref 3.5–5.0)
ALK PHOS: 61 U/L (ref 38–126)
ALT: 27 U/L (ref 17–63)
AST: 54 U/L — ABNORMAL HIGH (ref 15–41)
Anion gap: 9 (ref 5–15)
BILIRUBIN TOTAL: 0.7 mg/dL (ref 0.3–1.2)
BUN: 6 mg/dL (ref 6–20)
CO2: 28 mmol/L (ref 22–32)
Calcium: 8.7 mg/dL — ABNORMAL LOW (ref 8.9–10.3)
Chloride: 93 mmol/L — ABNORMAL LOW (ref 101–111)
Creatinine, Ser: 0.55 mg/dL — ABNORMAL LOW (ref 0.61–1.24)
GFR calc Af Amer: >60 mL/min (ref >60)
GFR calc non Af Amer: >60 mL/min (ref >60)
GLUCOSE: 100 mg/dL — AB (ref 65–99)
Potassium: 4.7 mmol/L (ref 3.5–5.1)
Sodium: 130 mmol/L — ABNORMAL LOW (ref 135–145)
TOTAL PROTEIN: 6.9 g/dL (ref 6.5–8.1)

## 2018-03-20 LAB — LIPASE, BLOOD: Lipase: 21 U/L (ref 11–51)

## 2018-03-20 MED ORDER — ALBUTEROL SULFATE (2.5 MG/3ML) 0.083% IN NEBU
5.0000 mg | INHALATION_SOLUTION | Freq: Once | RESPIRATORY_TRACT | Status: AC
  Administered 2018-03-20: 5 mg via RESPIRATORY_TRACT
  Filled 2018-03-20: qty 6

## 2018-03-20 MED ORDER — AZITHROMYCIN 250 MG PO TABS: 250.0000 mg | ORAL_TABLET | Freq: Every day | ORAL | 0 refills | Status: DC

## 2018-03-20 MED ORDER — TRAMADOL HCL 50 MG PO TABS
50.0000 mg | ORAL_TABLET | Freq: Once | ORAL | Status: AC
  Administered 2018-03-20: 50 mg via ORAL
  Filled 2018-03-20: qty 1

## 2018-03-20 MED ORDER — IPRATROPIUM BROMIDE 0.02 % IN SOLN
0.5000 mg | Freq: Once | RESPIRATORY_TRACT | Status: AC
  Administered 2018-03-20: 0.5 mg via RESPIRATORY_TRACT
  Filled 2018-03-20: qty 2.5

## 2018-03-20 MED ORDER — SODIUM CHLORIDE 0.9 % IV BOLUS
500.0000 mL | Freq: Once | INTRAVENOUS | Status: AC
  Administered 2018-03-20: 500 mL via INTRAVENOUS

## 2018-03-20 MED ORDER — SODIUM CHLORIDE 0.9 % IV SOLN
500.0000 mg | Freq: Once | INTRAVENOUS | Status: AC
  Administered 2018-03-20: 500 mg via INTRAVENOUS
  Filled 2018-03-20: qty 500

## 2018-03-20 MED ORDER — SODIUM CHLORIDE 0.9 % IV SOLN
1.0000 g | Freq: Once | INTRAVENOUS | Status: AC
  Administered 2018-03-20: 1 g via INTRAVENOUS
  Filled 2018-03-20: qty 10

## 2018-03-20 NOTE — Discharge Instructions (Addendum)
It was our pleasure to provide your ER care today - we hope that you feel better.  Take zithromax (antibiotic) as prescribed.  Use albuterol inhaler as need.   Avoid any smoking.   Follow up with primary care doctor in the next couple days for  recheck.   Return to ER if worse, trouble breathing, other concern.

## 2018-03-20 NOTE — ED Notes (Signed)
Pt given urine specimen cup in lobby. 

## 2018-03-20 NOTE — ED Provider Notes (Signed)
Patient placed in Quick Look pathway, seen and evaluated   Chief Complaint: chest pain and pancreatitis  HPI:   Pt complains of abdominal pain and coughing up phelgm  ROS: productive cough, nausea  Physical Exam:   Gen: No distress  Neuro: Awake and Alert  Skin: Warm    Focused Exam: Heat rrr   Initiation of care has begun. The patient has been counseled on the process, plan, and necessity for staying for the completion/evaluation, and the remainder of the medical screening examination   Russell Williamson 03/20/18 1551    Valarie Merino, MD 03/20/18 [phone]

## 2018-03-20 NOTE — ED Triage Notes (Signed)
Pt in c/o cough and congestion, also sore throat, states he keeps coughing up mucus, recently admitted for pneumonia and thinks he has it again, no distress noted

## 2018-03-20 NOTE — ED Provider Notes (Signed)
[facility] EMERGENCY DEPARTMENT Provider Note   CSN: [id number] Arrival date & time: 03/20/18  1537     History   Chief Complaint Chief Complaint  Patient presents with  . Cough    HPI Russell Williamson is a 62 y.o. male.  Patient with hx copd, chronic pancreatitis, c/o productive cough in past few days. Symptoms moderate, persistent, constant, without specific exacerbating or allev factors. No sore throat or runny nose. Denies fever or chills. Also states hx chronic epigastric pain/chronic pancreatitis is flaring up and requests med for same. Denies  Vomiting. No abd distension. Having normal bms. Denies chest pain. No leg pain or swelling. +smoker. Uses mdi prn.   The history is provided by the patient.  Cough  Pertinent negatives include no chest pain, no headaches, no sore throat, no shortness of breath and no eye redness.    Past Medical History:  Diagnosis Date  . Anemia   . Arthritis   . Cervical adenopathy 04/26/2012  . Chronic back pain    lower  . Chronic pancreatitis ([place])   . Constipation   . COPD (chronic obstructive pulmonary disease) ([place])   . Daily headache 05/23/2012   "small ones"  . Deficiency anemia 04/15/2016  . Dyspnea    with exertion  . G tube feedings ([facility])    hx of  feeding tube in stomach  . GERD (gastroesophageal reflux disease)   . History of blood transfusion ~ 2004   "from the pancreatitis"  . History of radiation therapy 02/02/2011-03/22/2011   head/neck,left tonsil  . Hx of radiation therapy 02/02/11 to 03/22/11   L tonsil  . Hypertension   . Insomnia   . 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 Pancytopenia ([place]) 02/28/2014  . Poor venous access 04/28/2016  . Seizures ([place])    alcohol-related last seizure 3 yrs ago  . Thrush 11/02/2013  . 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 Urinary hesitancy     Patient Active Problem List   Diagnosis Date Noted  . Acute respiratory failure  with hypoxia ([place]) 02/14/2018  . Glasgow coma scale total score 3-8 ([place])   . Respiratory failure ([place])   . Sepsis ([place])   . Pressure injury of skin 02/08/2018  . COPD with chronic bronchitis ([place]) 02/06/2018  . CAP (community acquired pneumonia) 02/06/2018  . Syncope 02/06/2018  . Hyponatremia 02/06/2018  . Dehydration 02/06/2018  . Fall at home, initial encounter 02/06/2018  . Tobacco abuse 02/06/2018  . Protein-calorie malnutrition, severe 07/03/2017  . Abnormal EKG 07/02/2017  . Abdominal pain, epigastric   . COPD exacerbation ([place]) 01/27/2017  . Oral thrush 01/27/2017  . 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 Encounter for intubation 05/07/2016  . Port catheter in place 05/05/2016  . Poor venous access 04/28/2016  . Deficiency anemia 04/15/2016  . Chronic leukopenia 04/19/2015  . Anemia in chronic illness 04/19/2015  . Weight loss, unintentional 04/19/2015  . Relapsing chronic pancreatitis ([place]) 01/17/2015  . Diarrhea 09/04/2014  . 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 Malnutrition of moderate degree ([place]) 09/03/2014  . Hypokalemia 09/02/2014  . Chronic alcoholic pancreatitis ([place]) 09/01/2014  . Mucositis 06/17/2014  . Chronic neck pain 06/17/2014  . S/P gastrostomy ([place]) 06/17/2014  . Nicotine abuse 02/28/2014  . Pancytopenia ([place]) 02/28/2014  . Hypothyroidism (acquired) 11/02/2013  . Hypothyroidism 10/29/2013  . 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 Cervical adenopathy 04/26/2012  . Hiatal hernia   . Hx of radiation therapy   .  HTN (hypertension) 10/16/2011  . History of cancer tonsil 08/20/2011    Past Surgical History:  Procedure Laterality Date  . BILE DUCT STENT PLACEMENT     hx of  . CHOLECYSTECTOMY  04/2005  . DIRECT LARYNGOSCOPY  05/23/2012   Procedure: DIRECT LARYNGOSCOPY;  Surgeon: Rozetta Nunnery, MD;  Location: [facility];  Service: ENT;  Laterality: N/A;  . DIRECT LARYNGOSCOPY N/A 03/23/2013   Procedure: DIRECT LARYNGOSCOPY;  Surgeon: Rozetta Nunnery, MD;  Location: [facility];  Service: ENT;  Laterality: N/A;  . ESOPHAGEAL DILATION N/A 03/23/2013   Procedure: ESOPHAGEAL DILATION;  Surgeon: Rozetta Nunnery, MD;  Location: [facility];  Service: ENT;  Laterality: N/A;  . ESOPHAGOGASTRODUODENOSCOPY (EGD) WITH PROPOFOL N/A 03/01/2017   Procedure: ESOPHAGOGASTRODUODENOSCOPY (EGD) WITH PROPOFOL;  Surgeon: Ladene Artist, MD;  Location: [facility];  Service: Endoscopy;  Laterality: N/A;  . FLEXIBLE SIGMOIDOSCOPY N/A 03/01/2017   Procedure: FLEXIBLE SIGMOIDOSCOPY;  Surgeon: Ladene Artist, MD;  Location: [facility];  Service: Endoscopy;  Laterality: N/A;  . IR GENERIC HISTORICAL  04/29/2016   IR US GUIDE VASC ACCESS RIGHT 04/29/2016 [facility]  . IR GENERIC HISTORICAL  04/29/2016   IR FLUORO GUIDE CV LINE RIGHT 04/29/2016 [facility]  . IR GENERIC HISTORICAL  05/12/2016   IR US GUIDE VASC ACCESS LEFT 05/12/2016 [facility]  . IR GENERIC HISTORICAL  05/12/2016   IR FLUORO GUIDE CV LINE LEFT 05/12/2016 [facility]  . IR GENERIC HISTORICAL  06/28/2016   IR US GUIDE VASC ACCESS RIGHT 06/28/2016 Sandi Mariscal, MD [facility]  . IR GENERIC HISTORICAL  06/28/2016   IR FLUORO GUIDE PORT INSERTION RIGHT 06/28/2016 Sandi Mariscal, MD [facility]  . IR REMOVAL TUN ACCESS W/ PORT W/O FL MOD SED  02/10/2017  . MASS BIOPSY  05/23/2012   Procedure: NECK MASS BIOPSY;  Surgeon: Rozetta Nunnery, MD;  Location: [facility];  Service: ENT;  Laterality: N/A;  . RADICAL NECK DISSECTION  05/23/2012   w/mass excision  . RADICAL NECK DISSECTION  05/23/2012   Procedure: RADICAL NECK DISSECTION;  Surgeon: Rozetta Nunnery, MD;  Location: [facility];  Service: ENT;  Laterality: Left;  . TIBIA FRACTURE SURGERY  1990's   left        Home Medications    Prior to Admission medications   Medication Sig Start Date End Date Taking? Authorizing Provider  amLODipine (NORVASC) 2.5 MG tablet Take 2.5 mg by mouth daily. 12/24/16   [provider]  feeding  supplement, ENSURE ENLIVE, (ENSURE ENLIVE) LIQD Take 237 mLs by mouth 3 (three) times daily between meals. 02/10/18   Elodia Florence., MD  folic acid (FOLVITE) 1 MG tablet Take 1 mg by mouth daily.  05/14/15   [provider]  lipase/protease/amylase (CREON-12/PANCREASE) 12000 UNITS CPEP capsule Take 1 capsule by mouth 4 (four) times daily.    [provider]  Maltodextrin-Xanthan Gum ([place]) POWD Add to liquids to make nectar thick consistency for dysphagia 02/10/18   Elodia Florence., MD  mirtazapine (REMERON) 30 MG tablet Take 30 mg by mouth at bedtime.    [provider]  oxyCODONE (ROXICODONE) 15 MG immediate release tablet Take 1 tablet (15 mg total) by mouth every 8 (eight) hours as needed for pain. 02/17/18   Domenic Polite, MD  pantoprazole (PROTONIX) 20 MG tablet Take 1 tablet (20 mg total) by mouth 2 (two) times daily. 10/07/16   Gareth Morgan, MD  PROAIR HFA 108 (90 Base) MCG/ACT inhaler Inhale 2 puff into lungs every 4-6 hours as needed for shortness of breath/wheezing 02/02/16   [provider]  promethazine (PHENERGAN) 25 MG tablet Take 1 tablet (25 mg total) by mouth every 6 (six) hours as needed for nausea or vomiting. 10/07/16   Gareth Morgan, MD  temazepam (RESTORIL) 30 MG capsule Take 30 mg by mouth at bedtime as needed for sleep. 01/27/18   [provider]    Family History Family History  Problem Relation Age of Onset  . COPD Mother   . Colon cancer Neg Hx     Social History Social History   Tobacco Use  . Smoking status: Current Every Day Smoker    Packs/day: 0.25    Years: 40.00    Pack years: 10.00    Types: Cigarettes  . Smokeless tobacco: Never Used  . Tobacco comment: hx 1/2 -1 PPD  Substance Use Topics  . Alcohol use: No    Comment: 05/23/2012 hx alcohol abuse, quit ~ 2007  . Drug use: No    Types: Marijuana    Comment: 05/23/2012 "used marijuana in my teens"     Allergies     Patient has no known allergies.   Review of Systems Review of Systems  Constitutional: Negative for fever.  HENT: Negative for sore throat.   Eyes: Negative for redness.  Respiratory: Positive for cough. Negative for shortness of breath.   Cardiovascular: Negative for chest pain.  Gastrointestinal: Positive for abdominal pain. Negative for vomiting.  Genitourinary: Negative for dysuria and flank pain.  Musculoskeletal: Negative for neck pain and neck stiffness.  Skin: Negative for rash.  Neurological: Negative for headaches.  Hematological: Does not bruise/bleed easily.  Psychiatric/Behavioral: Negative for confusion.     Physical Exam Updated Vital Signs BP 124/82 (BP Location: Right Arm)   Pulse 67   Temp 97.9 F (36.6 C) (Oral)   Resp 18   SpO2 93%   Physical Exam  Constitutional: He appears well-developed and well-nourished.  Very thin, frail appearing.   HENT:  Mouth/Throat: Oropharynx is clear and moist.  Eyes: Conjunctivae are normal.  Neck: Neck supple. No tracheal deviation present.  Cardiovascular: Normal rate, regular rhythm, normal heart sounds and intact distal pulses. Exam reveals no gallop and no friction rub.  No murmur heard. Pulmonary/Chest: Effort normal. No accessory muscle usage. No respiratory distress. He has wheezes.  Abdominal: Soft. Bowel sounds are normal. He exhibits no distension. There is no tenderness.  Genitourinary:  Genitourinary Comments: No cva tenderness  Musculoskeletal: He exhibits no edema or tenderness.  Neurological: He is alert.  Skin: Skin is warm and dry. He is not diaphoretic.  Psychiatric: He has a normal mood and affect.  Nursing note and vitals reviewed.    ED Treatments / Results  Labs (all labs ordered are listed, but only abnormal results are displayed) Results for orders placed or performed during the hospital encounter of 03/20/18  CBC with Differential/Platelet  Result Value Ref Range   WBC 3.3 (L) 4.0 -  10.5 K/uL   RBC 3.78 (L) 4.22 - 5.81 MIL/uL   Hemoglobin 12.4 (L) 13.0 - 17.0 g/dL   HCT 38.3 (L) 39.0 - 52.0 %   MCV 101.3 (H) 78.0 - 100.0 fL   MCH 32.8 26.0 - 34.0 pg   MCHC 32.4 30.0 - 36.0 g/dL   RDW 17.5 (H) 11.5 - 15.5 %   Platelets 207 150 - 400 K/uL   Neutrophils Relative %  63 %   Neutro Abs 2.1 1.7 - 7.7 K/uL   Lymphocytes Relative 16 %   Lymphs Abs 0.5 (L) 0.7 - 4.0 K/uL   Monocytes Relative 20 %   Monocytes Absolute 0.7 0.1 - 1.0 K/uL   Eosinophils Relative 0 %   Eosinophils Absolute 0.0 0.0 - 0.7 K/uL   Basophils Relative 1 %   Basophils Absolute 0.0 0.0 - 0.1 K/uL   Immature Granulocytes 0 %   Abs Immature Granulocytes 0.0 0.0 - 0.1 K/uL  Comprehensive metabolic panel  Result Value Ref Range   Sodium 130 (L) 135 - 145 mmol/L   Potassium 4.7 3.5 - 5.1 mmol/L   Chloride 93 (L) 101 - 111 mmol/L   CO2 28 22 - 32 mmol/L   Glucose, Bld 100 (H) 65 - 99 mg/dL   BUN 6 6 - 20 mg/dL   Creatinine, Ser 0.55 (L) 0.61 - 1.24 mg/dL   Calcium 8.7 (L) 8.9 - 10.3 mg/dL   Total Protein 6.9 6.5 - 8.1 g/dL   Albumin 3.4 (L) 3.5 - 5.0 g/dL   AST 54 (H) 15 - 41 U/L   ALT 27 17 - 63 U/L   Alkaline Phosphatase 61 38 - 126 U/L   Total Bilirubin 0.7 0.3 - 1.2 mg/dL   GFR calc non Af Amer >60 >60 mL/min   GFR calc Af Amer >60 >60 mL/min   Anion gap 9 5 - 15  Lipase, blood  Result Value Ref Range   Lipase 21 11 - 51 U/L   Dg Chest 2 View  Result Date: 03/20/2018 CLINICAL DATA:  63 year old male with chest tightness and cough 1 month. Treated for pneumonia last month. Smoker. EXAM: CHEST - 2 VIEW COMPARISON:  02/14/2018 and earlier. FINDINGS: Upright AP views of the chest. Large lung volumes. Improved right lower lung ventilation, although the right lung base is not yet at baseline. There is residual patchy and reticulonodular opacity just over the right hemidiaphragm. Similar streaky and reticular opacity at the left lung base, left lung base ventilation has also improved. No  superimposed pneumothorax. Evidence of small bilateral pleural effusions. Mediastinal contours remain within normal limits. Chronic right 6th rib fracture. Chronic left lateral 8th and 9th rib fractures. No acute osseous abnormality identified. Negative visible bowel gas pattern. IMPRESSION: 1. Regressed but not yet fully resolved bibasilar pneumonia since 02/14/2018. Ventilation has improved at both lung bases. 2. Persistent small bilateral pleural effusions. 3. Underlying chronic lung disease with hyperinflation. Electronically Signed   By: Genevie Ann M.D.   On: 03/20/2018 16:28    EKG EKG Interpretation  Date/Time:  Monday March 20 2018 15:45:13 EDT Ventricular Rate:  82 PR Interval:  154 QRS Duration: 58 QT Interval:  344 QTC Calculation: 401 R Axis:   83 Text Interpretation:  Normal sinus rhythm Confirmed by Lajean Saver [phone]) on 03/20/2018 6:47:37 PM   Radiology Dg Chest 2 View  Result Date: 03/20/2018 CLINICAL DATA:  62 year old male with chest tightness and cough 1 month. Treated for pneumonia last month. Smoker. EXAM: CHEST - 2 VIEW COMPARISON:  02/14/2018 and earlier. FINDINGS: Upright AP views of the chest. Large lung volumes. Improved right lower lung ventilation, although the right lung base is not yet at baseline. There is residual patchy and reticulonodular opacity just over the right hemidiaphragm. Similar streaky and reticular opacity at the left lung base, left lung base ventilation has also improved. No superimposed pneumothorax. Evidence of small bilateral pleural effusions. Mediastinal contours remain within  normal limits. Chronic right 6th rib fracture. Chronic left lateral 8th and 9th rib fractures. No acute osseous abnormality identified. Negative visible bowel gas pattern. IMPRESSION: 1. Regressed but not yet fully resolved bibasilar pneumonia since 02/14/2018. Ventilation has improved at both lung bases. 2. Persistent small bilateral pleural effusions. 3. Underlying chronic  lung disease with hyperinflation. Electronically Signed   By: Genevie Ann M.D.   On: 03/20/2018 16:28    Procedures Procedures (including critical care time)  Medications Ordered in ED Medications  albuterol (PROVENTIL) (2.5 MG/3ML) 0.083% nebulizer solution 5 mg (has no administration in time range)  ipratropium (ATROVENT) nebulizer solution 0.5 mg (has no administration in time range)  traMADol (ULTRAM) tablet 50 mg (has no administration in time range)  cefTRIAXone (ROCEPHIN) 1 g in sodium chloride 0.9 % 100 mL IVPB (has no administration in time range)  azithromycin (ZITHROMAX) 500 mg in sodium chloride 0.9 % 250 mL IVPB (has no administration in time range)     Initial Impression / Assessment and Plan / ED Course  I have reviewed the triage vital signs and the nursing notes.  Pertinent labs & imaging results that were available during my care of the patient were reviewed by me and considered in my medical decision making (see chart for details).  Iv ns. Labs. Cxr.  cxr reviewed - improved, but possible persistent pna.   Rocephin and zithromax iv. Albuterol and atrovent neb.  Labs reviewed - lipase is normal. abd is soft nt.   Pt requests pain med. Ultram po.  Reviewed nursing notes and prior charts for additional history.   Recheck, no increased wob. No wheezing. Tolerating po. .      Final Clinical Impressions(s) / ED Diagnoses   Final diagnoses:  None    ED Discharge Orders    None       Lajean Saver, MD 03/20/18 2113

## 2018-04-23 ENCOUNTER — Other Ambulatory Visit: Payer: Self-pay

## 2018-04-23 ENCOUNTER — Encounter (HOSPITAL_COMMUNITY): Payer: Self-pay | Admitting: Emergency Medicine

## 2018-04-23 ENCOUNTER — Emergency Department (HOSPITAL_COMMUNITY): Payer: Medicaid Other

## 2018-04-23 ENCOUNTER — Emergency Department (HOSPITAL_COMMUNITY)
Admission: EM | Admit: 2018-04-23 | Discharge: 2018-04-24 | Disposition: A | Discharge status: Home / Self Care | Payer: Medicaid Other | Attending: Emergency Medicine | Admitting: Emergency Medicine

## 2018-04-23 DIAGNOSIS — Z79899 Other long term (current) drug therapy: Secondary | ICD-10-CM | POA: Insufficient documentation

## 2018-04-23 DIAGNOSIS — J181 Lobar pneumonia, unspecified organism: Secondary | ICD-10-CM | POA: Diagnosis not present

## 2018-04-23 DIAGNOSIS — R0602 Shortness of breath: Secondary | ICD-10-CM

## 2018-04-23 DIAGNOSIS — J449 Chronic obstructive pulmonary disease, unspecified: Secondary | ICD-10-CM | POA: Diagnosis not present

## 2018-04-23 DIAGNOSIS — I10 Essential (primary) hypertension: Secondary | ICD-10-CM | POA: Diagnosis not present

## 2018-04-23 DIAGNOSIS — E860 Dehydration: Secondary | ICD-10-CM | POA: Diagnosis not present

## 2018-04-23 DIAGNOSIS — E871 Hypo-osmolality and hyponatremia: Secondary | ICD-10-CM | POA: Diagnosis not present

## 2018-04-23 DIAGNOSIS — Z85828 Personal history of other malignant neoplasm of skin: Secondary | ICD-10-CM | POA: Diagnosis not present

## 2018-04-23 DIAGNOSIS — G8929 Other chronic pain: Secondary | ICD-10-CM | POA: Diagnosis not present

## 2018-04-23 DIAGNOSIS — R1011 Right upper quadrant pain: Secondary | ICD-10-CM | POA: Diagnosis present

## 2018-04-23 DIAGNOSIS — F17210 Nicotine dependence, cigarettes, uncomplicated: Secondary | ICD-10-CM | POA: Insufficient documentation

## 2018-04-23 DIAGNOSIS — R05 Cough: Secondary | ICD-10-CM

## 2018-04-23 DIAGNOSIS — R059 Cough, unspecified: Secondary | ICD-10-CM

## 2018-04-23 DIAGNOSIS — J189 Pneumonia, unspecified organism: Secondary | ICD-10-CM

## 2018-04-23 DIAGNOSIS — E039 Hypothyroidism, unspecified: Secondary | ICD-10-CM | POA: Diagnosis not present

## 2018-04-23 DIAGNOSIS — R1013 Epigastric pain: Secondary | ICD-10-CM

## 2018-04-23 LAB — LIPASE, BLOOD: Lipase: 29 U/L (ref 11–51)

## 2018-04-23 LAB — COMPREHENSIVE METABOLIC PANEL
ALBUMIN: 3.6 g/dL (ref 3.5–5.0)
ALK PHOS: 88 U/L (ref 38–126)
ALT: 42 U/L (ref 0–44)
AST: 51 U/L — AB (ref 15–41)
Anion gap: 11 (ref 5–15)
BUN: 5 mg/dL — AB (ref 8–23)
CALCIUM: 9 mg/dL (ref 8.9–10.3)
CO2: 23 mmol/L (ref 22–32)
Chloride: 93 mmol/L — ABNORMAL LOW (ref 98–111)
Creatinine, Ser: 0.53 mg/dL — ABNORMAL LOW (ref 0.61–1.24)
GFR calc Af Amer: >60 mL/min (ref >60)
GFR calc non Af Amer: >60 mL/min (ref >60)
GLUCOSE: 99 mg/dL (ref 70–99)
Potassium: 3.8 mmol/L (ref 3.5–5.1)
Sodium: 127 mmol/L — ABNORMAL LOW (ref 135–145)
TOTAL PROTEIN: 7.5 g/dL (ref 6.5–8.1)
Total Bilirubin: 0.5 mg/dL (ref 0.3–1.2)

## 2018-04-23 LAB — CBC
HEMATOCRIT: 41.4 % (ref 39.0–52.0)
Hemoglobin: 13.8 g/dL (ref 13.0–17.0)
MCH: 33.8 pg (ref 26.0–34.0)
MCHC: 33.3 g/dL (ref 30.0–36.0)
MCV: 101.5 fL — ABNORMAL HIGH (ref 78.0–100.0)
Platelets: 216 10*3/uL (ref 150–400)
RBC: 4.08 MIL/uL — ABNORMAL LOW (ref 4.22–5.81)
RDW: 15.6 % — AB (ref 11.5–15.5)
WBC: 3 10*3/uL — ABNORMAL LOW (ref 4.0–10.5)

## 2018-04-23 MED ORDER — SODIUM CHLORIDE 0.9 % IV BOLUS
1000.0000 mL | Freq: Once | INTRAVENOUS | Status: AC
  Administered 2018-04-24: 1000 mL via INTRAVENOUS

## 2018-04-23 MED ORDER — HYDROMORPHONE HCL 1 MG/ML IJ SOLN
1.0000 mg | Freq: Once | INTRAMUSCULAR | Status: AC
  Administered 2018-04-24: 1 mg via INTRAVENOUS
  Filled 2018-04-23: qty 1

## 2018-04-23 MED ORDER — IPRATROPIUM BROMIDE 0.02 % IN SOLN
0.5000 mg | Freq: Once | RESPIRATORY_TRACT | Status: AC
  Administered 2018-04-24: 0.5 mg via RESPIRATORY_TRACT
  Filled 2018-04-23: qty 2.5

## 2018-04-23 MED ORDER — ALBUTEROL SULFATE (2.5 MG/3ML) 0.083% IN NEBU
5.0000 mg | INHALATION_SOLUTION | Freq: Once | RESPIRATORY_TRACT | Status: AC
  Administered 2018-04-24: 5 mg via RESPIRATORY_TRACT
  Filled 2018-04-23: qty 6

## 2018-04-23 NOTE — ED Provider Notes (Signed)
[facility] EMERGENCY DEPARTMENT Provider Note   CSN: [id number] Arrival date & time: 04/23/18  1937     History   Chief Complaint Chief Complaint  Patient presents with  . Neck Pain  . Cancer  . Abdominal Pain    HPI Russell Williamson is a 62 y.o. male with a hx of neoplasm of the neck, hypertension, hypothyroidism, pancytopenia, gastrostomy, alcoholic pancreatitis, anemia, iron deficiency anemia, COPD, presents to the Emergency Department complaining of gradual, persistent, progressively worsening [place] and right upper quadrant abdominal pain onset approximately 1 week ago.  Reports a history of pancreatitis.  He denies recent alcohol usage.  He reports this pain feels similar to his previous episodes of pancreatitis.  Patient does have a history of cholecystectomy.  He denies vomiting, diarrhea.  Reports he feels generally weak and dehydrated as it is difficult for him to take p.o. fluids.  He reports taking oxycodone 15 mg every 4-6 hours with moderate relief of his pain however he reports the pain comes back when this medication wears off.  Additionally, patient reports some worsening shortness of breath.  He denies orthopnea.  He reports he has had more congested and worsening cough over the last few days.  Patient also complains of worsening neck pain.  He reports this is chronic and pain is unchanged aside from intensity.  He is at baseline dysphagia which is unchanged.  He denies voice changes or stridor.  No difficulty breathing or stridor.  The history is provided by the patient and medical records. No language interpreter was used.    Past Medical History:  Diagnosis Date  . Anemia   . Arthritis   . Cervical adenopathy 04/26/2012  . Chronic back pain    lower  . Chronic pancreatitis ([place])   . Constipation   . COPD (chronic obstructive pulmonary disease) ([place])   . Daily headache 05/23/2012   "small ones"  . Deficiency anemia 04/15/2016  . Dyspnea      with exertion  . G tube feedings (H[facility])    hx of  feeding tube in stomach  . GERD (gastroesophageal reflux disease)   . History of blood transfusion ~ 2004   "from the pancreatitis"  . History of radiation therapy 02/02/2011-03/22/2011   head/neck,left tonsil  . Hx of radiation therapy 02/02/11 to 03/22/11   L tonsil  . Hypertension   . Insomnia   . 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 Pancytopenia ([place]) 02/28/2014  . Poor venous access 04/28/2016  . Seizures ([place])    alcohol-related last seizure 3 yrs ago  . Thrush 11/02/2013  . 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 Urinary hesitancy     Patient Active Problem List   Diagnosis Date Noted  . Acute respiratory failure with hypoxia ([place]) 02/14/2018  . Glasgow coma scale total score 3-8 ([place])   . Respiratory failure ([place])   . Sepsis ([place])   . Pressure injury of skin 02/08/2018  . COPD with chronic bronchitis ([place]) 02/06/2018  . CAP (community acquired pneumonia) 02/06/2018  . Syncope 02/06/2018  . Hyponatremia 02/06/2018  . Dehydration 02/06/2018  . Fall at home, initial encounter 02/06/2018  . Tobacco abuse 02/06/2018  . Protein-calorie malnutrition, severe 07/03/2017  . Abnormal EKG 07/02/2017  . Abdominal pain, epigastric   . COPD exacerbation ([place]) 01/27/2017  . Oral thrush 01/27/2017  . 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 Encounter for intubation 05/07/2016  . Port catheter in place  05/05/2016  . Poor venous access 04/28/2016  . Deficiency anemia 04/15/2016  . Chronic leukopenia 04/19/2015  . Anemia in chronic illness 04/19/2015  . Weight loss, unintentional 04/19/2015  . Relapsing chronic pancreatitis ([place]) 01/17/2015  . Diarrhea 09/04/2014  . 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 Malnutrition of moderate degree ([place]) 09/03/2014  . Hypokalemia 09/02/2014  . Chronic alcoholic pancreatitis ([place]) 09/01/2014  . Mucositis 06/17/2014  . Chronic neck pain 06/17/2014  . S/P gastrostomy ([place])  06/17/2014  . Nicotine abuse 02/28/2014  . Pancytopenia ([place]) 02/28/2014  . Hypothyroidism (acquired) 11/02/2013  . Hypothyroidism 10/29/2013  . 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 Cervical adenopathy 04/26/2012  . Hiatal hernia   . Hx of radiation therapy   . HTN (hypertension) 10/16/2011  . History of cancer tonsil 08/20/2011    Past Surgical History:  Procedure Laterality Date  . BILE DUCT STENT PLACEMENT     hx of  . CHOLECYSTECTOMY  04/2005  . DIRECT LARYNGOSCOPY  05/23/2012   Procedure: DIRECT LARYNGOSCOPY;  Surgeon: Rozetta Nunnery, MD;  Location: [facility];  Service: ENT;  Laterality: N/A;  . DIRECT LARYNGOSCOPY N/A 03/23/2013   Procedure: DIRECT LARYNGOSCOPY;  Surgeon: Rozetta Nunnery, MD;  Location: [facility];  Service: ENT;  Laterality: N/A;  . ESOPHAGEAL DILATION N/A 03/23/2013   Procedure: ESOPHAGEAL DILATION;  Surgeon: Rozetta Nunnery, MD;  Location: [facility];  Service: ENT;  Laterality: N/A;  . ESOPHAGOGASTRODUODENOSCOPY (EGD) WITH PROPOFOL N/A 03/01/2017   Procedure: ESOPHAGOGASTRODUODENOSCOPY (EGD) WITH PROPOFOL;  Surgeon: Ladene Artist, MD;  Location: [facility];  Service: Endoscopy;  Laterality: N/A;  . FLEXIBLE SIGMOIDOSCOPY N/A 03/01/2017   Procedure: FLEXIBLE SIGMOIDOSCOPY;  Surgeon: Ladene Artist, MD;  Location: [facility];  Service: Endoscopy;  Laterality: N/A;  . IR GENERIC HISTORICAL  04/29/2016   IR US GUIDE VASC ACCESS RIGHT 04/29/2016 [facility]  . IR GENERIC HISTORICAL  04/29/2016   IR FLUORO GUIDE CV LINE RIGHT 04/29/2016 [facility]  . IR GENERIC HISTORICAL  05/12/2016   IR US GUIDE VASC ACCESS LEFT 05/12/2016 [facility]  . IR GENERIC HISTORICAL  05/12/2016   IR FLUORO GUIDE CV LINE LEFT 05/12/2016 [facility]  . IR GENERIC HISTORICAL  06/28/2016   IR US GUIDE VASC ACCESS RIGHT 06/28/2016 Sandi Mariscal, MD [facility]  . IR GENERIC HISTORICAL  06/28/2016   IR FLUORO GUIDE PORT INSERTION RIGHT 06/28/2016 Sandi Mariscal, MD  [facility]  . IR REMOVAL TUN ACCESS W/ PORT W/O FL MOD SED  02/10/2017  . MASS BIOPSY  05/23/2012   Procedure: NECK MASS BIOPSY;  Surgeon: Rozetta Nunnery, MD;  Location: [facility];  Service: ENT;  Laterality: N/A;  . RADICAL NECK DISSECTION  05/23/2012   w/mass excision  . RADICAL NECK DISSECTION  05/23/2012   Procedure: RADICAL NECK DISSECTION;  Surgeon: Rozetta Nunnery, MD;  Location: [facility];  Service: ENT;  Laterality: Left;  . TIBIA FRACTURE SURGERY  1990's   left        Home Medications    Prior to Admission medications   Medication Sig Start Date End Date Taking? Authorizing Provider  amLODipine (NORVASC) 2.5 MG tablet Take 2.5 mg by mouth daily. 12/24/16  Yes [provider]  feeding supplement, ENSURE ENLIVE, (ENSURE ENLIVE) LIQD Take 237 mLs by mouth 3 (three) times daily between meals. 02/10/18  Yes Elodia Florence., MD  folic acid (FOLVITE) 1 MG tablet Take 1 mg by mouth daily.  05/14/15  Yes  [provider]  lipase/protease/amylase (CREON-12/PANCREASE) 12000 UNITS CPEP capsule Take 1 capsule by mouth 4 (four) times daily.   Yes [provider]  mirtazapine (REMERON) 30 MG tablet Take 30 mg by mouth at bedtime.   Yes [provider]  oxyCODONE (ROXICODONE) 15 MG immediate release tablet Take 1 tablet (15 mg total) by mouth every 8 (eight) hours as needed for pain. 02/17/18  Yes Domenic Polite, MD  pantoprazole (PROTONIX) 20 MG tablet Take 1 tablet (20 mg total) by mouth 2 (two) times daily. 10/07/16  Yes Gareth Morgan, MD  PROAIR HFA 108 [phone] Base) MCG/ACT inhaler Inhale 2 puff into lungs every 4-6 hours as needed for shortness of breath/wheezing 02/02/16  Yes [provider]  promethazine (PHENERGAN) 25 MG tablet Take 1 tablet (25 mg total) by mouth every 6 (six) hours as needed for nausea or vomiting. 10/07/16  Yes Gareth Morgan, MD  temazepam (RESTORIL) 30 MG capsule Take 30 mg by mouth at bedtime as needed for sleep.  01/27/18  Yes [provider]  azithromycin (ZITHROMAX Z-PAK) 250 MG tablet Take 1 tablet (250 mg total) by mouth daily. Take as directed Patient not taking: Reported on 04/23/2018 03/21/18   Lajean Saver, MD  doxycycline (VIBRAMYCIN) 100 MG capsule Take 1 capsule (100 mg total) by mouth 2 (two) times daily. 04/24/18   Anajulia Leyendecker, Jarrett Soho, PA-C  Maltodextrin-Xanthan Gum ([place]) POWD Add to liquids to make nectar thick consistency for dysphagia Patient not taking: Reported on 04/23/2018 02/10/18   Elodia Florence., MD    Family History Family History  Problem Relation Age of Onset  . COPD Mother   . Colon cancer Neg Hx     Social History Social History   Tobacco Use  . Smoking status: Current Every Day Smoker    Packs/day: 0.25    Years: 40.00    Pack years: 10.00    Types: Cigarettes  . Smokeless tobacco: Never Used  . Tobacco comment: hx 1/2 -1 PPD  Substance Use Topics  . Alcohol use: No    Comment: 05/23/2012 hx alcohol abuse, quit ~ 2007  . Drug use: No    Types: Marijuana    Comment: 05/23/2012 "used marijuana in my teens"     Allergies   Patient has no known allergies.   Review of Systems Review of Systems  Constitutional: Negative for appetite change, diaphoresis, fatigue, fever and unexpected weight change.  HENT: Negative for mouth sores.   Eyes: Negative for visual disturbance.  Respiratory: Positive for shortness of breath. Negative for cough, chest tightness and wheezing.   Cardiovascular: Negative for chest pain.  Gastrointestinal: Positive for abdominal pain and nausea. Negative for constipation, diarrhea and vomiting.  Endocrine: Negative for polydipsia, polyphagia and polyuria.  Genitourinary: Negative for dysuria, frequency, hematuria and urgency.  Musculoskeletal: Positive for neck pain. Negative for back pain and neck stiffness.  Skin: Negative for rash.  Allergic/Immunologic: Negative for immunocompromised state.    Neurological: Negative for syncope, light-headedness and headaches.  Hematological: Does not bruise/bleed easily.  Psychiatric/Behavioral: Negative for sleep disturbance. The patient is not nervous/anxious.      Physical Exam Updated Vital Signs BP (!) 128/97 (BP Location: Right Arm)   Pulse 66   Temp 97.7 F (36.5 C) (Oral)   Resp 16   Ht 5\' 6"  (1.676 m)   Wt 36.3 kg (80 lb)   SpO2 98%   BMI 12.91 kg/m   Physical Exam  Constitutional: He appears well-developed. No  distress.  Awake, alert, nontoxic appearance Chronically ill-appearing and cachectic  HENT:  Head: Normocephalic and atraumatic.  Mouth/Throat: Oropharynx is clear and moist. No oropharyngeal exudate.  Eyes: Conjunctivae are normal. No scleral icterus.  Neck: Normal range of motion. Neck supple.  Cardiovascular: Normal rate, regular rhythm and intact distal pulses.  Pulmonary/Chest: Effort normal. No accessory muscle usage or stridor. No tachypnea. No respiratory distress. He has no wheezes. He has rhonchi ( Throughout).  Equal chest expansion  Abdominal: Soft. Bowel sounds are normal. He exhibits no mass. There is tenderness in the right upper quadrant and epigastric area. There is guarding. There is no rigidity, no rebound and no CVA tenderness.  Numerous, well-healed scars  Musculoskeletal: Normal range of motion. He exhibits no edema.  Neurological: He is alert.  Speech is clear and goal oriented Moves extremities without ataxia  Skin: Skin is warm and dry. He is not diaphoretic.  Psychiatric: He has a normal mood and affect.  Nursing note and vitals reviewed.    ED Treatments / Results  Labs (all labs ordered are listed, but only abnormal results are displayed) Labs Reviewed  COMPREHENSIVE METABOLIC PANEL - Abnormal; Notable for the following components:      Result Value   Sodium 127 (*)    Chloride 93 (*)    BUN 5 (*)    Creatinine, Ser 0.53 (*)    AST 51 (*)    All other components within  normal limits  CBC - Abnormal; Notable for the following components:   WBC 3.0 (*)    RBC 4.08 (*)    MCV 101.5 (*)    RDW 15.6 (*)    All other components within normal limits  LIPASE, BLOOD  URINALYSIS, ROUTINE W REFLEX MICROSCOPIC  I-STAT CG4 LACTIC ACID, ED  I-STAT CG4 LACTIC ACID, ED    EKG EKG Interpretation  Date/Time:  Sunday April 23 2018 20:27:16 EDT Ventricular Rate:  77 PR Interval:  150 QRS Duration: 70 QT Interval:  386 QTC Calculation: 436 R Axis:   92 Text Interpretation:  Sinus rhythm with Premature supraventricular complexes Rightward axis Anteroseptal infarct , age undetermined Abnormal ECG Confirmed by Orpah Greek [phone]) on 04/24/2018 1:07:06 AM   Radiology Dg Chest 2 View  Result Date: 04/24/2018 CLINICAL DATA:  Initial evaluation for acute cough. EXAM: CHEST - 2 VIEW COMPARISON:  Prior radiograph from 03/20/2018 FINDINGS: Cardiac and mediastinal silhouettes are stable in size and contour, and remain within normal limits. Lungs are hyperinflated with underlying emphysematous changes. Persistent patchy and hazy right basilar opacity, which could reflect recurrent and/or residual pneumonia as seen on prior exam. Mild left basilar scarring. No pulmonary edema or pleural effusion. No pneumothorax. No acute osseous abnormality. IMPRESSION: 1. Persistent hazy and patchy right basilar opacity, which could reflect residual and/or recurrent pneumonia. 2. Left basilar scarring. 3. Hyperinflation with underlying emphysema. Electronically Signed   By: Jeannine Boga M.D.   On: 04/24/2018 00:21    Procedures Procedures (including critical care time)  Medications Ordered in ED Medications  sodium chloride 0.9 % bolus 1,000 mL (0 mLs Intravenous Stopped 04/24/18 0132)  albuterol (PROVENTIL) (2.5 MG/3ML) 0.083% nebulizer solution 5 mg (5 mg Nebulization Given 04/24/18 0015)  ipratropium (ATROVENT) nebulizer solution 0.5 mg (0.5 mg Nebulization Given 04/24/18  0015)  HYDROmorphone (DILAUDID) injection 1 mg (1 mg Intravenous Given 04/24/18 0032)  doxycycline (VIBRA-TABS) tablet 100 mg (100 mg Oral Given 04/24/18 0215)  albuterol (PROVENTIL) (2.5 MG/3ML) 0.083% nebulizer solution 5 mg (  5 mg Nebulization Given 04/24/18 0147)  ipratropium (ATROVENT) nebulizer solution 0.5 mg (0.5 mg Nebulization Given 04/24/18 0148)     Initial Impression / Assessment and Plan / ED Course  I have reviewed the triage vital signs and the nursing notes.  Pertinent labs & imaging results that were available during my care of the patient were reviewed by me and considered in my medical decision making (see chart for details).  Clinical Course as of Apr 24 441  Mon Apr 24, 2018  0121 Improved tidal volume after nebulizer.  Now with rales in the right lower lobe.   [HM]  7209 Patient reports his pain is significantly better.  Will p.o. trial.  He is receiving fluids.   [HM]  0121 Dehydration.  Sodium(!): 127 [HM]  0121 Above baseline, suspect hemoconcentration.  Hemoglobin: 13.8 [HM]  0121 Normal lipase.  Baseline AST/ALT.  Lipase: 29 [HM]  0121 Right lower lobe consolidation. I personally evaluated these images  DG Chest 2 View [HM]  [phone] Reports he is feeling better.  He has tolerated p.o. without difficulty.   [HM]  0439 Breath sounds have improved after second nebulizer.   [HM]    Clinical Course User Index [HM] Branna Cortina, Gwenlyn Perking    Presents with several complaints including acute on chronic abdominal pain.  Labs are reassuring including normal lipase and normal AST/ALT.  Pain is worse with breathing.  Chest x-ray shows community-acquired pneumonia.  Personally evaluated the images.  Pain well controlled here in the emergency department.  He is eating and drinking without difficulty.  No vomiting.  No emesis at home.  Of note, patient reports some decreased p.o. intake and his sodium is low.  Additionally, patient's hemoglobin is normal today however  suspect hemoconcentration.  He has been given fluids for dehydration.  Record review shows that patient was treated for community-acquired pneumonia 4 weeks ago with azithromycin.  As it has returned today, will discharge home with doxycycline.  No hypoxia here in the emergency department.  He is ambulated without respiratory distress or hypoxia.  Gust reasons to return immediately to the emergency department.  Patient states understanding and is in agreement with the plan.  Final Clinical Impressions(s) / ED Diagnoses   Final diagnoses:  SOB (shortness of breath)  Cough  Epigastric abdominal pain  Community acquired pneumonia of right lower lobe of lung ([place])  Hyponatremia  Dehydration    ED Discharge Orders        Ordered    doxycycline (VIBRAMYCIN) 100 MG capsule  2 times daily     04/24/18 0441       Katrianna Friesenhahn, Jarrett Soho, PA-C 04/24/18 0443    Orpah Greek, MD 04/24/18 [phone]

## 2018-04-23 NOTE — ED Triage Notes (Signed)
Pt reports 10/10 neck pain from cancer and 9/10 RUQ pain that started about 3 days ago. Pt not having chemo just medication controlled. Pt reports N/Vx2 and constipation, last BM yesterday. Pt states he has been having difficulty swallowing and has been pureeing foods, pt reports weight loss over last month and loss of appetite.

## 2018-04-23 NOTE — ED Notes (Signed)
Pt in XRAY 

## 2018-04-23 NOTE — Progress Notes (Signed)
  RT to administer nebulizer. Patient at X-ray.

## 2018-04-23 NOTE — ED Notes (Signed)
Patient transported to X-ray 

## 2018-04-24 LAB — URINALYSIS, ROUTINE W REFLEX MICROSCOPIC
Bilirubin Urine: NEGATIVE
GLUCOSE, UA: NEGATIVE mg/dL
Hgb urine dipstick: NEGATIVE
KETONES UR: NEGATIVE mg/dL
LEUKOCYTES UA: NEGATIVE
Nitrite: NEGATIVE
Protein, ur: NEGATIVE mg/dL
Specific Gravity, Urine: 1.016 (ref 1.005–1.030)
pH: 6 (ref 5.0–8.0)

## 2018-04-24 LAB — I-STAT CG4 LACTIC ACID, ED: Lactic Acid, Venous: 1.66 mmol/L (ref 0.5–1.9)

## 2018-04-24 MED ORDER — DOXYCYCLINE HYCLATE 100 MG PO CAPS: 100.0000 mg | ORAL_CAPSULE | Freq: Two times a day (BID) | ORAL | 0 refills | Status: DC

## 2018-04-24 MED ORDER — OXYCODONE-ACETAMINOPHEN 5-325 MG PO TABS
2.0000 | ORAL_TABLET | Freq: Once | ORAL | Status: AC
  Administered 2018-04-24: 2 via ORAL
  Filled 2018-04-24: qty 2

## 2018-04-24 MED ORDER — DOXYCYCLINE HYCLATE 100 MG PO TABS
100.0000 mg | ORAL_TABLET | Freq: Once | ORAL | Status: AC
  Administered 2018-04-24: 100 mg via ORAL
  Filled 2018-04-24: qty 1

## 2018-04-24 MED ORDER — IPRATROPIUM BROMIDE 0.02 % IN SOLN
0.5000 mg | Freq: Once | RESPIRATORY_TRACT | Status: AC
  Administered 2018-04-24: 0.5 mg via RESPIRATORY_TRACT
  Filled 2018-04-24: qty 2.5

## 2018-04-24 MED ORDER — ALBUTEROL SULFATE (2.5 MG/3ML) 0.083% IN NEBU
5.0000 mg | INHALATION_SOLUTION | Freq: Once | RESPIRATORY_TRACT | Status: AC
  Administered 2018-04-24: 5 mg via RESPIRATORY_TRACT
  Filled 2018-04-24: qty 6

## 2018-04-24 NOTE — ED Notes (Signed)
Pt ambulated in hall with his cane per normal

## 2018-04-24 NOTE — ED Notes (Signed)
Pt understood dc material. NAD noted. Script given at dc  

## 2018-04-24 NOTE — Discharge Instructions (Addendum)
1. Medications: doxycycline, usual home medications 2. Treatment: rest, drink plenty of fluids,  3. Follow Up: Please followup with your primary doctor in 1-2 days for discussion of your diagnoses and further evaluation after today's visit; if you do not have a primary care doctor use the resource guide provided to find one; Please return to the ER for running symptoms including vomiting, fevers, shortness of breath or other concerns.

## 2018-05-26 ENCOUNTER — Encounter (HOSPITAL_COMMUNITY): Payer: Self-pay | Admitting: *Deleted

## 2018-05-26 ENCOUNTER — Emergency Department (HOSPITAL_COMMUNITY)
Admission: EM | Admit: 2018-05-26 | Discharge: 2018-05-27 | Disposition: A | Discharge status: Home / Self Care | Payer: Medicaid Other | Attending: Emergency Medicine | Admitting: Emergency Medicine

## 2018-05-26 ENCOUNTER — Emergency Department (HOSPITAL_COMMUNITY): Payer: Medicaid Other

## 2018-05-26 DIAGNOSIS — L89153 Pressure ulcer of sacral region, stage 3: Secondary | ICD-10-CM | POA: Insufficient documentation

## 2018-05-26 DIAGNOSIS — J449 Chronic obstructive pulmonary disease, unspecified: Secondary | ICD-10-CM | POA: Insufficient documentation

## 2018-05-26 DIAGNOSIS — M6281 Muscle weakness (generalized): Secondary | ICD-10-CM | POA: Diagnosis present

## 2018-05-26 DIAGNOSIS — R531 Weakness: Secondary | ICD-10-CM

## 2018-05-26 DIAGNOSIS — R109 Unspecified abdominal pain: Secondary | ICD-10-CM | POA: Diagnosis not present

## 2018-05-26 DIAGNOSIS — M7918 Myalgia, other site: Secondary | ICD-10-CM | POA: Insufficient documentation

## 2018-05-26 DIAGNOSIS — R05 Cough: Secondary | ICD-10-CM | POA: Insufficient documentation

## 2018-05-26 DIAGNOSIS — R059 Cough, unspecified: Secondary | ICD-10-CM

## 2018-05-26 DIAGNOSIS — Z8509 Personal history of malignant neoplasm of other digestive organs: Secondary | ICD-10-CM | POA: Insufficient documentation

## 2018-05-26 DIAGNOSIS — R112 Nausea with vomiting, unspecified: Secondary | ICD-10-CM | POA: Diagnosis not present

## 2018-05-26 LAB — COMPREHENSIVE METABOLIC PANEL
ALT: 24 U/L (ref 0–44)
AST: 42 U/L — ABNORMAL HIGH (ref 15–41)
Albumin: 3.3 g/dL — ABNORMAL LOW (ref 3.5–5.0)
Alkaline Phosphatase: 90 U/L (ref 38–126)
Anion gap: 13 (ref 5–15)
BUN: 18 mg/dL (ref 8–23)
CO2: 33 mmol/L — ABNORMAL HIGH (ref 22–32)
Calcium: 9 mg/dL (ref 8.9–10.3)
Chloride: 90 mmol/L — ABNORMAL LOW (ref 98–111)
Creatinine, Ser: 0.6 mg/dL — ABNORMAL LOW (ref 0.61–1.24)
GFR calc Af Amer: >60 mL/min (ref >60)
GFR calc non Af Amer: >60 mL/min (ref >60)
Glucose, Bld: 93 mg/dL (ref 70–99)
Potassium: 4.8 mmol/L (ref 3.5–5.1)
Sodium: 136 mmol/L (ref 135–145)
Total Bilirubin: 0.7 mg/dL (ref 0.3–1.2)
Total Protein: 8.2 g/dL — ABNORMAL HIGH (ref 6.5–8.1)

## 2018-05-26 LAB — CBC WITH DIFFERENTIAL/PLATELET
Basophils Absolute: 0 10*3/uL (ref 0.0–0.1)
Basophils Relative: 0 %
Eosinophils Absolute: 0 10*3/uL (ref 0.0–0.7)
Eosinophils Relative: 1 %
HCT: 39.6 % (ref 39.0–52.0)
Hemoglobin: 13.8 g/dL (ref 13.0–17.0)
Lymphocytes Relative: 13 %
Lymphs Abs: 0.6 10*3/uL — ABNORMAL LOW (ref 0.7–4.0)
MCH: 35 pg — ABNORMAL HIGH (ref 26.0–34.0)
MCHC: 34.8 g/dL (ref 30.0–36.0)
MCV: 100.5 fL — ABNORMAL HIGH (ref 78.0–100.0)
Monocytes Absolute: 0.3 10*3/uL (ref 0.1–1.0)
Monocytes Relative: 6 %
Neutro Abs: 4.1 10*3/uL (ref 1.7–7.7)
Neutrophils Relative %: 80 %
Platelets: 265 10*3/uL (ref 150–400)
RBC: 3.94 MIL/uL — ABNORMAL LOW (ref 4.22–5.81)
RDW: 16 % — ABNORMAL HIGH (ref 11.5–15.5)
WBC: 5 10*3/uL (ref 4.0–10.5)

## 2018-05-26 LAB — I-STAT CG4 LACTIC ACID, ED
Lactic Acid, Venous: 2.14 mmol/L (ref 0.5–1.9)
Lactic Acid, Venous: 0.69 mmol/L (ref 0.5–1.9)

## 2018-05-26 LAB — POC OCCULT BLOOD, ED: Fecal Occult Bld: NEGATIVE

## 2018-05-26 LAB — I-STAT TROPONIN, ED: Troponin i, poc: 0.02 ng/mL (ref 0.00–0.08)

## 2018-05-26 LAB — LIPASE, BLOOD: Lipase: 21 U/L (ref 11–51)

## 2018-05-26 MED ORDER — SODIUM CHLORIDE 0.9 % IV SOLN: 1.0000 g | Freq: Once | INTRAVENOUS | Status: DC

## 2018-05-26 MED ORDER — IOHEXOL 300 MG/ML  SOLN: 30.0000 mL | Freq: Once | INTRAMUSCULAR | Status: DC | PRN

## 2018-05-26 MED ORDER — MORPHINE SULFATE (PF) 4 MG/ML IV SOLN
4.0000 mg | Freq: Once | INTRAVENOUS | Status: AC
  Administered 2018-05-26: 4 mg via INTRAVENOUS
  Filled 2018-05-26: qty 1

## 2018-05-26 MED ORDER — FENTANYL CITRATE (PF) 100 MCG/2ML IJ SOLN
50.0000 ug | Freq: Once | INTRAMUSCULAR | Status: AC
  Administered 2018-05-26: 50 ug via INTRAVENOUS
  Filled 2018-05-26: qty 2

## 2018-05-26 MED ORDER — SODIUM CHLORIDE 0.9 % IV BOLUS
1000.0000 mL | Freq: Once | INTRAVENOUS | Status: AC
  Administered 2018-05-26: 1000 mL via INTRAVENOUS

## 2018-05-26 MED ORDER — ONDANSETRON HCL 4 MG/2ML IJ SOLN
4.0000 mg | Freq: Once | INTRAMUSCULAR | Status: AC
  Administered 2018-05-26: 4 mg via INTRAVENOUS
  Filled 2018-05-26: qty 2

## 2018-05-26 MED ORDER — SODIUM CHLORIDE 0.9 % IV SOLN: 500.0000 mg | Freq: Once | INTRAVENOUS | Status: DC

## 2018-05-26 NOTE — ED Notes (Signed)
PIV attempted again for CT. Unsuccessful. Provider aware.

## 2018-05-26 NOTE — ED Provider Notes (Signed)
[facility] DEPT Provider Note   CSN: [id number] Arrival date & time: 05/26/18  1622     History   Chief Complaint Chief Complaint  Patient presents with  . Weakness  . Generalized Body Aches    HPI Russell Williamson is a 62 y.o. male with history of tonsillar cancer, chronic pancreatitis, COPD who presents with a 2-day history of generalized weakness and 4 to 5-day history of black stools.  Patient reports he had an episode of vomiting this morning.  He also has abdominal pain.  He denies any urinary symptoms or diarrhea.  He denies any chest pain or shortness of breath.  He reports he occasionally takes Pepto-Bismol as needed.  He last took it this morning.  HPI  Past Medical History:  Diagnosis Date  . Anemia   . Arthritis   . Cervical adenopathy 04/26/2012  . Chronic back pain    lower  . Chronic pancreatitis ([place])   . Constipation   . COPD (chronic obstructive pulmonary disease) ([place])   . Daily headache 05/23/2012   "small ones"  . Deficiency anemia 04/15/2016  . Dyspnea    with exertion  . G tube feedings (HCC)    hx of  feeding tube in stomach  . GERD (gastroesophageal reflux disease)   . History of blood transfusion ~ 2004   "from the pancreatitis"  . History of radiation therapy 02/02/2011-03/22/2011   head/neck,left tonsil  . Hx of radiation therapy 02/02/11 to 03/22/11   L tonsil  . Hypertension   . Insomnia   . 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 Pancytopenia ([place]) 02/28/2014  . Poor venous access 04/28/2016  . Seizures ([place])    alcohol-related last seizure 3 yrs ago  . Thrush 11/02/2013  . Tonsillar cancer ([place]) 12/15/2010   Left  . Urinary hesitancy     Patient Active Problem List   Diagnosis Date Noted  . Acute respiratory failure with hypoxia ([place]) 02/14/2018  . Glasgow coma scale total score 3-8 ([place])   . Respiratory failure ([place])   . Sepsis ([place])   . Pressure injury of skin 02/08/2018    . COPD with chronic bronchitis ([place]) 02/06/2018  . CAP (community acquired pneumonia) 02/06/2018  . Syncope 02/06/2018  . Hyponatremia 02/06/2018  . Dehydration 02/06/2018  . Fall at home, initial encounter 02/06/2018  . Tobacco abuse 02/06/2018  . Protein-calorie malnutrition, severe 07/03/2017  . Abnormal EKG 07/02/2017  . Abdominal pain, epigastric   . COPD exacerbation ([place]) 01/27/2017  . Oral thrush 01/27/2017  . 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 Encounter for intubation 05/07/2016  . Port catheter in place 05/05/2016  . Poor venous access 04/28/2016  . Deficiency anemia 04/15/2016  . Chronic leukopenia 04/19/2015  . Anemia in chronic illness 04/19/2015  . Weight loss, unintentional 04/19/2015  . Relapsing chronic pancreatitis ([place]) 01/17/2015  . Diarrhea 09/04/2014  . 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 Malnutrition of moderate degree ([place]) 09/03/2014  . Hypokalemia 09/02/2014  . Chronic alcoholic pancreatitis ([place]) 09/01/2014  . Mucositis 06/17/2014  . Chronic neck pain 06/17/2014  . S/P gastrostomy ([place]) 06/17/2014  . Nicotine abuse 02/28/2014  . Pancytopenia ([place]) 02/28/2014  . Hypothyroidism (acquired) 11/02/2013  . Hypothyroidism 10/29/2013  . 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 Cervical adenopathy 04/26/2012  . Hiatal hernia   . Hx of radiation therapy   . HTN (hypertension) 10/16/2011  . History of cancer tonsil 08/20/2011    Past Surgical History:  Procedure Laterality  Date  . BILE DUCT STENT PLACEMENT     hx of  . CHOLECYSTECTOMY  04/2005  . DIRECT LARYNGOSCOPY  05/23/2012   Procedure: DIRECT LARYNGOSCOPY;  Surgeon: Rozetta Nunnery, MD;  Location: [facility];  Service: ENT;  Laterality: N/A;  . DIRECT LARYNGOSCOPY N/A 03/23/2013   Procedure: DIRECT LARYNGOSCOPY;  Surgeon: Rozetta Nunnery, MD;  Location: [facility];  Service: ENT;  Laterality: N/A;  . ESOPHAGEAL DILATION N/A 03/23/2013   Procedure: ESOPHAGEAL DILATION;  Surgeon: Rozetta Nunnery, MD;   Location: [facility];  Service: ENT;  Laterality: N/A;  . ESOPHAGOGASTRODUODENOSCOPY (EGD) WITH PROPOFOL N/A 03/01/2017   Procedure: ESOPHAGOGASTRODUODENOSCOPY (EGD) WITH PROPOFOL;  Surgeon: Ladene Artist, MD;  Location: [facility];  Service: Endoscopy;  Laterality: N/A;  . FLEXIBLE SIGMOIDOSCOPY N/A 03/01/2017   Procedure: FLEXIBLE SIGMOIDOSCOPY;  Surgeon: Ladene Artist, MD;  [facility];  Service: Endoscopy;  Laterality: N/A;  . IR GENERIC HISTORICAL  04/29/2016   IR US GUIDE VASC ACCESS RIGHT 04/29/2016 [facility]  . IR GENERIC HISTORICAL  04/29/2016   IR FLUORO GUIDE CV LINE RIGHT 04/29/2016 [facility]  . IR GENERIC HISTORICAL  05/12/2016   IR US GUIDE VASC ACCESS LEFT 05/12/2016 [facility]  . IR GENERIC HISTORICAL  05/12/2016   IR FLUORO GUIDE CV LINE LEFT 05/12/2016 [facility]  . IR GENERIC HISTORICAL  06/28/2016   IR US GUIDE VASC ACCESS RIGHT 06/28/2016 Sandi Mariscal, MD [facility]  . IR GENERIC HISTORICAL  06/28/2016   IR FLUORO GUIDE PORT INSERTION RIGHT 06/28/2016 Sandi Mariscal, MD [facility]  . IR REMOVAL TUN ACCESS W/ PORT W/O FL MOD SED  02/10/2017  . MASS BIOPSY  05/23/2012   Procedure: NECK MASS BIOPSY;  Surgeon: Rozetta Nunnery, MD;  Location: [facility];  Service: ENT;  Laterality: N/A;  . RADICAL NECK DISSECTION  05/23/2012   w/mass excision  . RADICAL NECK DISSECTION  05/23/2012   Procedure: RADICAL NECK DISSECTION;  Surgeon: Rozetta Nunnery, MD;  Location: [facility];  Service: ENT;  Laterality: Left;  . TIBIA FRACTURE SURGERY  1990's   left        Home Medications    Prior to Admission medications   Medication Sig Start Date End Date Taking? Authorizing Provider  amLODipine (NORVASC) 2.5 MG tablet Take 2.5 mg by mouth daily. 12/24/16  Yes [provider]  feeding supplement, ENSURE ENLIVE, (ENSURE ENLIVE) LIQD Take 237 mLs by mouth 3 (three) times daily between meals. 02/10/18  Yes Elodia Florence., MD  folic acid  (FOLVITE) 1 MG tablet Take 1 mg by mouth daily.  05/14/15  Yes [provider]  lipase/protease/amylase (CREON-12/PANCREASE) 12000 UNITS CPEP capsule Take 1 capsule by mouth 4 (four) times daily.   Yes [provider]  mirtazapine (REMERON) 30 MG tablet Take 30 mg by mouth at bedtime.   Yes [provider]  oxyCODONE (ROXICODONE) 15 MG immediate release tablet Take 1 tablet (15 mg total) by mouth every 8 (eight) hours as needed for pain. 02/17/18  Yes Domenic Polite, MD  PROAIR HFA 108 [phone] Base) MCG/ACT inhaler Inhale 2 puff into lungs every 4-6 hours as needed for shortness of breath/wheezing 02/02/16  Yes [provider]  temazepam (RESTORIL) 30 MG capsule Take 30 mg by mouth at bedtime as needed for sleep. 01/27/18  Yes [provider]  amoxicillin-clavulanate (AUGMENTIN) 875-125 MG tablet Take 1 tablet by mouth every 12 (twelve) hours. 05/27/18  Mardy Hoppe M, PA-C  azithromycin (ZITHROMAX Z-PAK) 250 MG tablet Take 1 tablet (250 mg total) by mouth daily. Take as directed Patient not taking: Reported on 04/23/2018 03/21/18   Lajean Saver, MD  doxycycline (VIBRAMYCIN) 100 MG capsule Take 1 capsule (100 mg total) by mouth 2 (two) times daily. Patient not taking: Reported on 05/26/2018 04/24/18   Muthersbaugh, Jarrett Soho, PA-C  Maltodextrin-Xanthan Gum (RESOURCE THICKENUP CLEAR) POWD Add to liquids to make nectar thick consistency for dysphagia Patient not taking: Reported on 04/23/2018 02/10/18   Elodia Florence., MD  pantoprazole (PROTONIX) 20 MG tablet Take 1 tablet (20 mg total) by mouth 2 (two) times daily. 05/27/18   Semir Brill, Bea Graff, PA-C  promethazine (PHENERGAN) 25 MG tablet Take 1 tablet (25 mg total) by mouth every 6 (six) hours as needed for nausea or vomiting. 05/27/18   Shizue Kaseman, Bea Graff, PA-C  sucralfate (CARAFATE) 1 GM/10ML suspension Take 10 mLs (1 g total) by mouth 4 (four) times daily -  with meals and at bedtime. 05/27/18   Frederica Kuster,  PA-C    Family History Family History  Problem Relation Age of Onset  . COPD Mother   . Colon cancer Neg Hx     Social History Social History   Tobacco Use  . Smoking status: Current Every Day Smoker    Packs/day: 0.25    Years: 40.00    Pack years: 10.00    Types: Cigarettes  . Smokeless tobacco: Never Used  . Tobacco comment: hx 1/2 -1 PPD  Substance Use Topics  . Alcohol use: No    Comment: 05/23/2012 hx alcohol abuse, quit ~ 2007  . Drug use: No    Types: Marijuana    Comment: 05/23/2012 "used marijuana in my teens"     Allergies   Patient has no known allergies.   Review of Systems Review of Systems  Constitutional: Negative for chills and fever.  HENT: Negative for facial swelling and sore throat.   Respiratory: Negative for shortness of breath.   Cardiovascular: Negative for chest pain.  Gastrointestinal: Positive for abdominal pain, blood in stool ("black stools"), nausea and vomiting. Negative for diarrhea.  Genitourinary: Negative for dysuria.  Musculoskeletal: Negative for back pain.  Skin: Negative for rash and wound.  Neurological: Negative for headaches.  Psychiatric/Behavioral: The patient is not nervous/anxious.      Physical Exam Updated Vital Signs BP 114/79 (BP Location: Left Arm)   Pulse 62   Temp (!) 97.2 F (36.2 C) (Oral)   Resp 14   SpO2 100%   Physical Exam  Constitutional: He appears well-developed and well-nourished. No distress.  Cachectic appearing, very thin  HENT:  Head: Normocephalic and atraumatic.  Mouth/Throat: Oropharynx is clear and moist. No oropharyngeal exudate.  Thrush noted in the oral cavity (chronic per chart review)  Eyes: Pupils are equal, round, and reactive to light. Conjunctivae are normal. Right eye exhibits no discharge. Left eye exhibits no discharge. No scleral icterus.  Neck: Normal range of motion. Neck supple. No thyromegaly present.  Cardiovascular: Normal rate, regular rhythm, normal heart  sounds and intact distal pulses. Exam reveals no gallop and no friction rub.  No murmur heard. Pulmonary/Chest: Effort normal and breath sounds normal. No stridor. No respiratory distress. He has no wheezes. He has no rales.  Abdominal: Soft. Bowel sounds are normal. He exhibits no distension. There is no tenderness. There is no rebound and no guarding.  Genitourinary: Rectal exam shows guaiac negative stool.  Genitourinary  Comments: Patient with sacral decubitus ulcer, foul-smelling, stage III, no surrounding erythema, no active drainage (patient reports he was not aware of it) Patient has no adipose tissue on his buttocks at all  Musculoskeletal: He exhibits no edema.  Lymphadenopathy:    He has no cervical adenopathy.  Neurological: He is alert. Coordination normal.  Skin: Skin is warm and dry. No rash noted. He is not diaphoretic. No pallor.  Psychiatric: He has a normal mood and affect.  Nursing note and vitals reviewed.    ED Treatments / Results  Labs (all labs ordered are listed, but only abnormal results are displayed) Labs Reviewed  COMPREHENSIVE METABOLIC PANEL - Abnormal; Notable for the following components:      Result Value   Chloride 90 (*)    CO2 33 (*)    Creatinine, Ser 0.60 (*)    Total Protein 8.2 (*)    Albumin 3.3 (*)    AST 42 (*)    All other components within normal limits  CBC WITH DIFFERENTIAL/PLATELET - Abnormal; Notable for the following components:   RBC 3.94 (*)    MCV 100.5 (*)    MCH 35.0 (*)    RDW 16.0 (*)    Lymphs Abs 0.6 (*)    All other components within normal limits  I-STAT CG4 LACTIC ACID, ED - Abnormal; Notable for the following components:   Lactic Acid, Venous 2.14 (*)    All other components within normal limits  CULTURE, BLOOD (ROUTINE X 2)  CULTURE, BLOOD (ROUTINE X 2)  LIPASE, BLOOD  I-STAT TROPONIN, ED  POC OCCULT BLOOD, ED  I-STAT CG4 LACTIC ACID, ED    EKG None  Radiology Ct Abdomen Pelvis Wo Contrast  Result  Date: 05/26/2018 CLINICAL DATA:  Nausea and vomiting. Abdominal pain. Patient reports generalized body aches and weakness over the past week. Patient reports history of pancreatic and throat cancer. EXAM: CT ABDOMEN AND PELVIS WITHOUT CONTRAST TECHNIQUE: Multidetector CT imaging of the abdomen and pelvis was performed following the standard protocol without IV contrast. COMPARISON:  CT 02/07/2018 FINDINGS: Lower chest: Diminished pleural effusions from prior CT with mild right-greater-than-left residual. Associated bibasilar patchy and confluent airspace opacities which appear slightly nodular, also with mild improvement. Emphysema noted. Hepatobiliary: Lack of IV contrast and cachexia significantly limits detailed assessment. Particularly, limited assessment for focal hepatic lesion. Pneumobilia in the left lobe likely postprocedural. No evidence of focal lesion on noncontrast exam. Postcholecystectomy. Biliary tree not well visualized. Pancreas: Pancreas not well seen given cachexia and lack of IV contrast. There are pancreatic calcifications, which are better delineated on prior contrast-enhanced exam. No gross peripancreatic inflammation allowing limitations. Spleen: Normal in size but otherwise limited evaluation. Adrenals/Urinary Tract: Adrenal glands not visualized. Kidneys demonstrate diffuse cortical low-density with increased density in the renal pyramids, of unknown significance. Urinary bladder is physiologically distended. No evidence of wall thickening. Stomach/Bowel: Generalized cachexia paucity of intra-abdominal fat limits assessment. Suspected diffuse gastric wall thickening. No bowel dilatation to suggest obstruction. Formed stool in the distal colon. Proximal colon is not well assessed. Vascular/Lymphatic: Advanced aortic and branch atherosclerosis. Significantly limited evaluation for adenopathy. Air in the region of the left iliac vein, parent suggesting attempted catheter placement.  Reproductive: Prostate gland grossly normal. Other: Progressive cachexia over the past 3 months. Suspected mild mesenteric and body wall edema. Musculoskeletal: Bony under mineralization. Subchondral sclerosis of the femoral heads may be AVN versus secondary to osteopenia. No focal bone lesion. IMPRESSION: 1. Significant cachexia, which has progressed over  the past 3 months. In conjunction with lack of IV contrast, this is a significantly limits assessment. 2. Suspected gastric wall thickening which can be seen with gastritis or peptic ulcer disease. 3. Pancreatic calcifications with otherwise limited evaluation of the pancreas. 4. Lung base findings with persistent but improved pleural effusions and patchy, nodular consolidative airspace opacities that remain nonspecific. Suspect chronic aspiration with debris in the airways. 5. Advanced Aortic Atherosclerosis (ICD10-I70.0). Electronically Signed   By: Jeb Levering M.D.   On: 05/26/2018 22:41   Dg Chest Portable 1 View  Result Date: 05/26/2018 CLINICAL DATA:  Generalized body ache and weakness. Black stools for the past 3-4 days. History of pancreatic and throat cancer. COPD. EXAM: PORTABLE CHEST 1 VIEW COMPARISON:  04/23/2018 FINDINGS: Emphysematous hyperinflation of the lungs with superimposed left basilar airspace disease, atelectasis and/or scarring. Findings raise concern for small focus of pneumonia. No overt pulmonary edema. No effusion or pneumothorax. Heart size and mediastinal contours are stable. No aggressive osseous lesions. IMPRESSION: 1. Emphysematous hyperinflation of the lungs consistent with COPD. 2. Superimposed left basilar airspace opacity, atelectasis and/or scarring. Small focus of pneumonia is not excluded. Electronically Signed   By: Ashley Royalty M.D.   On: 05/26/2018 23:37    Procedures Procedures (including critical care time)  Medications Ordered in ED Medications  iohexol (OMNIPAQUE) 300 MG/ML solution 30 mL (has no  administration in time range)  sodium chloride 0.9 % bolus 1,000 mL (0 mLs Intravenous Stopped 05/26/18 2051)  ondansetron (ZOFRAN) injection 4 mg (4 mg Intravenous Given 05/26/18 1802)  morphine 4 MG/ML injection 4 mg (4 mg Intravenous Given 05/26/18 1802)  morphine 4 MG/ML injection 4 mg (4 mg Intravenous Given 05/26/18 2028)  sodium chloride 0.9 % bolus 1,000 mL (1,000 mLs Intravenous New Bag/Given 05/26/18 2150)  fentaNYL (SUBLIMAZE) injection 50 mcg (50 mcg Intravenous Given 05/26/18 2217)  fentaNYL (SUBLIMAZE) injection 50 mcg (50 mcg Intravenous Given 05/26/18 2353)     Initial Impression / Assessment and Plan / ED Course  I have reviewed the triage vital signs and the nursing notes.  Pertinent labs & imaging results that were available during my care of the patient were reviewed by me and considered in my medical decision making (see chart for details).     Patient presenting with a one-week history of generalized weakness, few day history of abdominal pain and black stools, and a one-week history of cough.  Patient found to have gastritis and probable pneumonia.  CT abdomen pelvis without contrast shows evidence of aspiration so will cover patient with Zosyn for aspiration pneumonia.  Patient had mild lactic acidosis, 2.14, which was cleared with fluids.  Patient with very difficult IV access and could not obtain an IV suitable for IV contrasted studies.  After patient had been in the ED for several hours, patient's primary caregiver and niece called the department and made [place] aware that oncology sent patient here for scan of his upper body to assess for spread of cancer.  This was after CT abdomen pelvis without contrast was completed.  I consulted oncology on-call, Dr. Alen Blew, who did not feel further imaging at this time was necessary emergently based on no cancer seen on abdomen/pelvis and to follow up in the office.  Patient's niece requests at home health assistance at home in case  management was consulted for this purpose.  Patient has sacral decubitus ulcer which does have a foul odor, however no obvious signs of infection.  This was dressed with barrier  cream and protective dressing.  I spoke with Dr. open with [facility] in consultation for admission, however he declined saying patient does not meet criteria for admission.  Will discharge home with Augmentin for possible pneumonia, Protonix and Carafate for gastritis versus PUD, and Phenergan.  Patient understands and agrees with plan.  I also spoke with patient's caregiver, [name], on the phone who understands and agrees with plan as well.  They will follow-up with oncology outpatient.  Patient vitals stable and discharged in satisfactory condition.  Final Clinical Impressions(s) / ED Diagnoses   Final diagnoses:  Generalized weakness  Cough    ED Discharge Orders         Ordered    pantoprazole (PROTONIX) 20 MG tablet  2 times daily,   Status:  Discontinued     05/27/18 0056    amoxicillin-clavulanate (AUGMENTIN) 875-125 MG tablet  Every 12 hours     05/27/18 0056    sucralfate (CARAFATE) 1 GM/10ML suspension  3 times daily with meals & bedtime     05/27/18 0056    promethazine (PHENERGAN) 25 MG tablet  Every 6 hours PRN,   Status:  Discontinued     05/27/18 0056    promethazine (PHENERGAN) 25 MG tablet  Every 6 hours PRN     05/27/18 0058    pantoprazole (PROTONIX) 20 MG tablet  2 times daily     05/27/18 0058           Frederica Kuster, PA-C 05/27/18 Keene Breath, MD 06/01/18 [phone]

## 2018-05-26 NOTE — ED Provider Notes (Signed)
Medical screening examination/treatment/procedure(s) were conducted as a shared visit with non-physician practitioner(s) and myself.  I personally evaluated the patient during the encounter.  Patient has history of pancreatic and throat cancer.  Had increased abdominal pain and epigastric pain.  He had advised feels similar to pancreatic pain.  Has had a lot of general weight loss.  Patient is cachectic.  He is alert and appropriate.  Heart is regular.  No respiratory distress.  Lungs grossly clear.  Abdomen scaphoid.  Equal decubitus without surrounding erythema or drainage.  Extremities extremely cachectic   None Angiocath insertion Performed by: Charlesetta Shanks  Consent: Verbal consent obtained. Risks and benefits: risks, benefits and alternatives were discussed Time out: Immediately prior to procedure a "time out" was called to verify the correct patient, procedure, equipment, support staff and site/side marked as required.  Preparation: Patient was prepped and draped in the usual sterile fashion.  Vein Location:right EJ    Gauge: 20  Normal blood return and flush without difficulty Patient tolerance: Patient tolerated the procedure well with no immediate complications.  Generalized weakness, weight loss, failure to thrive and abdominal pain.  Agree with plan of management.   Charlesetta Shanks, MD 05/30/18 1236

## 2018-05-26 NOTE — ED Triage Notes (Signed)
Pt complains of generalized body aches, weakness over the past week. Pt states he's had black stool for the past 3-4 days. Pt reports hx of pancreatic and throat cancer.

## 2018-05-27 MED ORDER — PANTOPRAZOLE SODIUM 20 MG PO TBEC: 20.0000 mg | DELAYED_RELEASE_TABLET | Freq: Two times a day (BID) | ORAL | 0 refills | Status: DC

## 2018-05-27 MED ORDER — PROMETHAZINE HCL 25 MG PO TABS: 25.0000 mg | ORAL_TABLET | Freq: Four times a day (QID) | ORAL | 0 refills | Status: DC | PRN

## 2018-05-27 MED ORDER — SUCRALFATE 1 GM/10ML PO SUSP: 1.0000 g | Freq: Three times a day (TID) | ORAL | 0 refills | Status: DC

## 2018-05-27 MED ORDER — PROMETHAZINE HCL 25 MG PO TABS: 25.0000 mg | ORAL_TABLET | Freq: Four times a day (QID) | ORAL | 0 refills | Status: AC | PRN

## 2018-05-27 MED ORDER — AMOXICILLIN-POT CLAVULANATE 875-125 MG PO TABS: 1.0000 | ORAL_TABLET | Freq: Two times a day (BID) | ORAL | 0 refills | Status: DC

## 2018-05-27 MED ORDER — PIPERACILLIN-TAZOBACTAM 3.375 G IVPB 30 MIN
3.3750 g | INTRAVENOUS | Status: DC
  Administered 2018-05-27: 3.375 g via INTRAVENOUS
  Filled 2018-05-27: qty 50

## 2018-05-27 NOTE — Progress Notes (Signed)
A consult was received from an ED physician for zosyn per pharmacy dosing.  The patient's profile has been reviewed for ht/wt/allergies/indication/available labs.   A one time order has been placed for Zosyn 3.375 Gm.  Further antibiotics/pharmacy consults should be ordered by admitting physician if indicated.                       Thank you, Dorrene German 05/27/2018  12:02 AM

## 2018-05-27 NOTE — ED Notes (Signed)
Discharge instructions reviewed with pt. Pt verbalized understanding. Dressing placed over wound. PIV removed. Pt assisted with getting dressed. Pt assisted to waiting room via wheelchair. Pt leaving with family.

## 2018-05-27 NOTE — Discharge Instructions (Signed)
Take Protonix daily.  Take Phenergan as needed for nausea or vomiting.  Take Augmentin until completed.  You can take Carafate prior to eating to help coat the stomach lining and prevent upset stomach.  Please follow-up with oncologist for further direction of CTs and if IV contrast is necessary. Please return the emergency department if you develop any new or worsening symptoms.

## 2018-05-29 NOTE — Care Management Note (Signed)
Case Management Note  CM consulted for [facility] wound care.  No HH orders were placed for pt.  Pt will need to have a face-to-face in order start [facility] by his PCP or oncologist.  CM called pt to provide this information and it went straight to VM that was not set up.  CM updated Law, PA via messages.  No further CM needs noted at this time.  Alnisa Hasley, Benjaman Lobe, RN 05/29/2018, 10:36 AM

## 2018-05-30 ENCOUNTER — Emergency Department (HOSPITAL_COMMUNITY): Payer: Medicaid Other

## 2018-05-30 ENCOUNTER — Encounter (HOSPITAL_COMMUNITY): Payer: Self-pay

## 2018-05-30 ENCOUNTER — Emergency Department (HOSPITAL_COMMUNITY)
Admission: EM | Admit: 2018-05-30 | Discharge: 2018-05-30 | Disposition: A | Discharge status: Home / Self Care | Payer: Medicaid Other | Attending: Emergency Medicine | Admitting: Emergency Medicine

## 2018-05-30 DIAGNOSIS — C140 Malignant neoplasm of pharynx, unspecified: Secondary | ICD-10-CM | POA: Insufficient documentation

## 2018-05-30 DIAGNOSIS — G8929 Other chronic pain: Secondary | ICD-10-CM | POA: Insufficient documentation

## 2018-05-30 DIAGNOSIS — I10 Essential (primary) hypertension: Secondary | ICD-10-CM | POA: Insufficient documentation

## 2018-05-30 DIAGNOSIS — M6250 Muscle wasting and atrophy, not elsewhere classified, unspecified site: Secondary | ICD-10-CM | POA: Insufficient documentation

## 2018-05-30 DIAGNOSIS — F17210 Nicotine dependence, cigarettes, uncomplicated: Secondary | ICD-10-CM | POA: Insufficient documentation

## 2018-05-30 DIAGNOSIS — R1013 Epigastric pain: Secondary | ICD-10-CM | POA: Diagnosis present

## 2018-05-30 DIAGNOSIS — C259 Malignant neoplasm of pancreas, unspecified: Secondary | ICD-10-CM | POA: Insufficient documentation

## 2018-05-30 DIAGNOSIS — J449 Chronic obstructive pulmonary disease, unspecified: Secondary | ICD-10-CM | POA: Diagnosis not present

## 2018-05-30 LAB — URINALYSIS, ROUTINE W REFLEX MICROSCOPIC
BILIRUBIN URINE: NEGATIVE
Glucose, UA: NEGATIVE mg/dL
HGB URINE DIPSTICK: NEGATIVE
Ketones, ur: NEGATIVE mg/dL
Leukocytes, UA: NEGATIVE
Nitrite: NEGATIVE
PH: 6 (ref 5.0–8.0)
Protein, ur: NEGATIVE mg/dL
SPECIFIC GRAVITY, URINE: 1.021 (ref 1.005–1.030)

## 2018-05-30 LAB — COMPREHENSIVE METABOLIC PANEL
ALBUMIN: 3.1 g/dL — AB (ref 3.5–5.0)
ALK PHOS: 124 U/L (ref 38–126)
ALT: 77 U/L — ABNORMAL HIGH (ref 0–44)
ANION GAP: 9 (ref 5–15)
AST: 135 U/L — ABNORMAL HIGH (ref 15–41)
BUN: 14 mg/dL (ref 8–23)
CALCIUM: 8.8 mg/dL — AB (ref 8.9–10.3)
CO2: 30 mmol/L (ref 22–32)
Chloride: 100 mmol/L (ref 98–111)
Creatinine, Ser: 0.54 mg/dL — ABNORMAL LOW (ref 0.61–1.24)
GFR calc Af Amer: >60 mL/min (ref >60)
GFR calc non Af Amer: >60 mL/min (ref >60)
GLUCOSE: 59 mg/dL — AB (ref 70–99)
Potassium: 3.4 mmol/L — ABNORMAL LOW (ref 3.5–5.1)
SODIUM: 139 mmol/L (ref 135–145)
Total Bilirubin: 0.3 mg/dL (ref 0.3–1.2)
Total Protein: 7.5 g/dL (ref 6.5–8.1)

## 2018-05-30 LAB — RAPID URINE DRUG SCREEN, HOSP PERFORMED
AMPHETAMINES: NOT DETECTED
BARBITURATES: NOT DETECTED
Benzodiazepines: POSITIVE — AB
Cocaine: POSITIVE — AB
Opiates: POSITIVE — AB
TETRAHYDROCANNABINOL: NOT DETECTED

## 2018-05-30 LAB — CBC
HEMATOCRIT: 35.3 % — AB (ref 39.0–52.0)
HEMOGLOBIN: 12.1 g/dL — AB (ref 13.0–17.0)
MCH: 34.3 pg — AB (ref 26.0–34.0)
MCHC: 34.3 g/dL (ref 30.0–36.0)
MCV: 100 fL (ref 78.0–100.0)
Platelets: 246 10*3/uL (ref 150–400)
RBC: 3.53 MIL/uL — AB (ref 4.22–5.81)
RDW: 15.9 % — ABNORMAL HIGH (ref 11.5–15.5)
WBC: 2.9 10*3/uL — ABNORMAL LOW (ref 4.0–10.5)

## 2018-05-30 LAB — LIPASE, BLOOD: Lipase: 21 U/L (ref 11–51)

## 2018-05-30 MED ORDER — MORPHINE SULFATE (PF) 4 MG/ML IV SOLN
4.0000 mg | Freq: Once | INTRAVENOUS | Status: DC
  Filled 2018-05-30: qty 1

## 2018-05-30 MED ORDER — IOPAMIDOL (ISOVUE-300) INJECTION 61%
100.0000 mL | Freq: Once | INTRAVENOUS | Status: AC | PRN
  Administered 2018-05-30: 100 mL via INTRAVENOUS

## 2018-05-30 MED ORDER — FENTANYL CITRATE (PF) 100 MCG/2ML IJ SOLN: 50.0000 ug | Freq: Once | INTRAMUSCULAR | Status: DC

## 2018-05-30 MED ORDER — ONDANSETRON HCL 4 MG/2ML IJ SOLN
4.0000 mg | Freq: Once | INTRAMUSCULAR | Status: AC
  Administered 2018-05-30: 4 mg via INTRAVENOUS
  Filled 2018-05-30: qty 2

## 2018-05-30 MED ORDER — IOPAMIDOL (ISOVUE-300) INJECTION 61%
INTRAVENOUS | Status: AC
  Filled 2018-05-30: qty 100

## 2018-05-30 MED ORDER — OXYCODONE-ACETAMINOPHEN 5-325 MG PO TABS
1.0000 | ORAL_TABLET | Freq: Once | ORAL | Status: AC
  Administered 2018-05-30: 1 via ORAL
  Filled 2018-05-30: qty 1

## 2018-05-30 MED ORDER — FENTANYL CITRATE (PF) 100 MCG/2ML IJ SOLN
35.0000 ug | Freq: Once | INTRAMUSCULAR | Status: AC
  Administered 2018-05-30: 35 ug via INTRAVENOUS
  Filled 2018-05-30: qty 2

## 2018-05-30 NOTE — Progress Notes (Signed)
CSW aware of consult. CSW spoke with patient at bedside. Per patient, he is currently living with his niece and niece's daughter. Patient states that they are both going back to school and that they will not be able to support patient as much. Patient states that he feels safe returning home. Patient gave CSW verbal consent to reach out to niece, Roanna Raider KX:[phone]. CSW to consult with RNCM regarding home health services and potentially home health social worker to assist with placement from the community. CSW will continue to follow.  Ollen Barges, [place] Work Department  [employer]  [phone]

## 2018-05-30 NOTE — ED Notes (Signed)
Pt provided water with medication, able to drink freely without issue

## 2018-05-30 NOTE — ED Notes (Signed)
Pt sleeping during assessment. Denies breakthrough pain. Holding Oxycodone

## 2018-05-30 NOTE — ED Triage Notes (Signed)
Pt was seen on the 23rd for the same and dx with pancreatitis Pt complains of the same pain that hasn't got any better  He complains of n,v,d and the pain radiating to the back

## 2018-05-30 NOTE — ED Notes (Signed)
ED MD at bedside to place [place] IV.

## 2018-05-30 NOTE — ED Notes (Signed)
Pt to CT

## 2018-05-30 NOTE — ED Provider Notes (Signed)
  Physical Exam  BP 110/67   Pulse 61   Temp (!) 97.4 F (36.3 C) (Oral)   Resp 16   SpO2 96%   Physical Exam  ED Course/Procedures   Clinical Course as of May 30 999  Tue May 30, 2018  0504 I placed a left-sided ultrasound-guided peripheral IV.  Labs were drawn from that site.  When nurse went in to give patient medication, the IV blew.  I placed another IV on the right side.  Patient is ready for CT scan.   [AN]  0630 UA is positive for cocaine and benzos. Patient continues to complain of abdominal pain.   Urine rapid drug screen (hosp performed)(!) [AN]  0631 Labs overall appear reassuring. CT results pending.   [AN]  [phone] Social work and case management consult placed. Patient does not meet criteria for admission at this time. He needs optimal outpatient management and I think home nursing will help.   [AN]    Clinical Course User Index [AN] Varney Biles, MD    Procedures  MDM  Seen by social work and cleared for discharge.  Home health set up her      Davonna Belling, MD 05/30/18 1000

## 2018-05-30 NOTE — Care Management Note (Signed)
Case Management Note  Patient Details  Name: Russell Williamson MRN: [id number] Date of Birth: [birth year]  CM consulted for [facility] services.  Pt choose [facility].  Discussed the best way to reach pt via phone.  He reports if he does not answer his phone to call his niece [name].  Confirmed both phone numbers in [facility].  Contacted Santiago Glad with [facility] who accepted pt for services.  She has concerns about previous reports of drug use in the home and stated if the [facility] team encounters this, they will close his case.  CM acknowledged information and reminder her that pt likely needs assistance with placement.  Eliberto Ivory, RN and Dr. Alvino Chapel.  No further CM needs noted at this time.  Expected Discharge Date:   05/30/2018               Expected Discharge Plan:  [place]  In-House Referral:  Clinical Social Work  Discharge planning Services  CM Consult  Post Acute Care Choice:  Home Health Choice offered to:  Patient  HH Arranged:  Therapist, sports, Social Work [employer] Agency:  [place]  Status of Service:  Completed, signed off  Curren Mohrmann, Benjaman Lobe, RN 05/30/2018, 9:31 AM

## 2018-05-30 NOTE — ED Notes (Signed)
PIV attempted x2, unsuccessful. Pt bathed and changed into gown. New allevyn placed on L ischial wound. Barrier cream placed on sacrum and L hip. Pt pillow supported. Will continue to monitor.

## 2018-05-30 NOTE — ED Provider Notes (Addendum)
[facility] DEPT Provider Note   CSN: [id number] Arrival date & time: 05/30/18  5427     History   Chief Complaint Chief Complaint  Patient presents with  . Abdominal Pain    HPI Russell Williamson is a 62 y.o. male.  HPI  62 year old male comes in with chief complaint of abdominal pain. Patient has history of throat cancer and pancreatic cancer.  He reports that he has been cancer free since 2011, however he has not followed up with any oncology physician since then.  Over the past several days patient has been having persistent abdominal discomfort.  Abdominal pain is epigastric and radiates towards his throat.  Patient feels that the pain is similar to his pancreatitis pain.  In addition, patient reports that he has had significant weight loss over the past few days.  He currently weighs 36 kg, he states he was weighing about 10 kg more 3 months ago.  Review of system is also positive for nausea and vomiting, bowel movements have been mostly normal.  Patient denies any substance abuse, but reports that he is around drugs all the time.  Past Medical History:  Diagnosis Date  . Anemia   . Arthritis   . Cervical adenopathy 04/26/2012  . Chronic back pain    lower  . Chronic pancreatitis ([place])   . Constipation   . COPD (chronic obstructive pulmonary disease) ([place])   . Daily headache 05/23/2012   "small ones"  . Deficiency anemia 04/15/2016  . Dyspnea    with exertion  . G tube feedings ([facility])    hx of  feeding tube in stomach  . GERD (gastroesophageal reflux disease)   . History of blood transfusion ~ 2004   "from the pancreatitis"  . History of radiation therapy 02/02/2011-03/22/2011   head/neck,left tonsil  . Hx of radiation therapy 02/02/11 to 03/22/11   L tonsil  . Hypertension   . Insomnia   . 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 Pancytopenia ([place]) 02/28/2014  . Poor venous access 04/28/2016  . Seizures (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 Thrush 11/02/2013  . 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 Urinary hesitancy     Patient Active Problem List   Diagnosis Date Noted  . Acute respiratory failure with hypoxia ([place]) 02/14/2018  . Glasgow coma scale total score 3-8 ([place])   . Respiratory failure ([place])   . Sepsis ([place])   . Pressure injury of skin 02/08/2018  . COPD with chronic bronchitis ([place]) 02/06/2018  . CAP (community acquired pneumonia) 02/06/2018  . Syncope 02/06/2018  . Hyponatremia 02/06/2018  . Dehydration 02/06/2018  . Fall at home, initial encounter 02/06/2018  . Tobacco abuse 02/06/2018  . Protein-calorie malnutrition, severe 07/03/2017  . Abnormal EKG 07/02/2017  . Abdominal pain, epigastric   . COPD exacerbation ([place]) 01/27/2017  . Oral thrush 01/27/2017  . 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 Encounter for intubation 05/07/2016  . Port catheter in place 05/05/2016  . Poor venous access 04/28/2016  . Deficiency anemia 04/15/2016  . Chronic leukopenia 04/19/2015  . Anemia in chronic illness 04/19/2015  . Weight loss, unintentional 04/19/2015  . Relapsing chronic pancreatitis ([place]) 01/17/2015  . Diarrhea 09/04/2014  . 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 Malnutrition of moderate degree ([place]) 09/03/2014  . Hypokalemia 09/02/2014  . Chronic alcoholic pancreatitis ([place]) 09/01/2014  . Mucositis 06/17/2014  . Chronic neck pain 06/17/2014  .  S/P gastrostomy ([place]) 06/17/2014  . Nicotine abuse 02/28/2014  . Pancytopenia ([place]) 02/28/2014  . Hypothyroidism (acquired) 11/02/2013  . Hypothyroidism 10/29/2013  . 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 Cervical adenopathy 04/26/2012  . Hiatal hernia   . Hx of radiation therapy   . HTN (hypertension) 10/16/2011  . History of cancer tonsil 08/20/2011    Past Surgical History:  Procedure Laterality Date  . BILE DUCT STENT PLACEMENT     hx of  . CHOLECYSTECTOMY  04/2005  . DIRECT LARYNGOSCOPY  05/23/2012   Procedure: DIRECT  LARYNGOSCOPY;  Surgeon: Rozetta Nunnery, MD;  Location: [facility];  Service: ENT;  Laterality: N/A;  . DIRECT LARYNGOSCOPY N/A 03/23/2013   Procedure: DIRECT LARYNGOSCOPY;  Surgeon: Rozetta Nunnery, MD;  Location: [facility];  Service: ENT;  Laterality: N/A;  . ESOPHAGEAL DILATION N/A 03/23/2013   Procedure: ESOPHAGEAL DILATION;  Surgeon: Rozetta Nunnery, MD;  Location: [facility];  Service: ENT;  Laterality: N/A;  . ESOPHAGOGASTRODUODENOSCOPY (EGD) WITH PROPOFOL N/A 03/01/2017   Procedure: ESOPHAGOGASTRODUODENOSCOPY (EGD) WITH PROPOFOL;  Surgeon: Ladene Artist, MD;  Location: [facility];  Service: Endoscopy;  Laterality: N/A;  . FLEXIBLE SIGMOIDOSCOPY N/A 03/01/2017   Procedure: FLEXIBLE SIGMOIDOSCOPY;  Surgeon: Ladene Artist, MD;  Location[facility];  Service: Endoscopy;  Laterality: N/A;  . IR GENERIC HISTORICAL  04/29/2016   IR US GUIDE VASC ACCESS RIGHT 04/29/2016 [facility]  . IR GENERIC HISTORICAL  04/29/2016   IR FLUORO GUIDE CV LINE RIGHT 04/29/2016 [facility]  . IR GENERIC HISTORICAL  05/12/2016   IR US GUIDE VASC ACCESS LEFT 05/12/2016 [facility]  . IR GENERIC HISTORICAL  05/12/2016   IR FLUORO GUIDE CV LINE LEFT 05/12/2016 [facility]  . IR GENERIC HISTORICAL  06/28/2016   IR US GUIDE VASC ACCESS RIGHT 06/28/2016 Sandi Mariscal, MD [facility]  . IR GENERIC HISTORICAL  06/28/2016   IR FLUORO GUIDE PORT INSERTION RIGHT 06/28/2016 Sandi Mariscal, MD [facility]  . IR REMOVAL TUN ACCESS W/ PORT W/O FL MOD SED  02/10/2017  . MASS BIOPSY  05/23/2012   Procedure: NECK MASS BIOPSY;  Surgeon: Rozetta Nunnery, MD;  Location: [facility];  Service: ENT;  Laterality: N/A;  . RADICAL NECK DISSECTION  05/23/2012   w/mass excision  . RADICAL NECK DISSECTION  05/23/2012   Procedure: RADICAL NECK DISSECTION;  Surgeon: Rozetta Nunnery, MD;  Location: [facility];  Service: ENT;  Laterality: Left;  . TIBIA FRACTURE SURGERY  1990's   left        Home  Medications    Prior to Admission medications   Medication Sig Start Date End Date Taking? Authorizing Provider  amLODipine (NORVASC) 2.5 MG tablet Take 2.5 mg by mouth daily. 12/24/16  Yes [provider]  amoxicillin-clavulanate (AUGMENTIN) 875-125 MG tablet Take 1 tablet by mouth every 12 (twelve) hours. 05/27/18  Yes Law, Bea Graff, PA-C  feeding supplement, ENSURE ENLIVE, (ENSURE ENLIVE) LIQD Take 237 mLs by mouth 3 (three) times daily between meals. 02/10/18  Yes Elodia Florence., MD  folic acid (FOLVITE) 1 MG tablet Take 1 mg by mouth daily.  05/14/15  Yes [provider]  lipase/protease/amylase (CREON-12/PANCREASE) 12000 UNITS CPEP capsule Take 12,000 Units by mouth 4 (four) times daily.    Yes [provider]  mirtazapine (REMERON) 30 MG tablet Take 30 mg by mouth at bedtime.   Yes [provider]  pantoprazole (PROTONIX) 20 MG tablet Take 1 tablet (20  mg total) by mouth 2 (two) times daily. 05/27/18  Yes Law, Bea Graff, PA-C  PROAIR HFA 108 [phone] Base) MCG/ACT inhaler Inhale 1-2 puffs into the lungs every 6 (six) hours as needed for wheezing or shortness of breath.  02/02/16  Yes [provider]  promethazine (PHENERGAN) 25 MG tablet Take 1 tablet (25 mg total) by mouth every 6 (six) hours as needed for nausea or vomiting. 05/27/18  Yes Law, Alexandra M, PA-C  sucralfate (CARAFATE) 1 GM/10ML suspension Take 10 mLs (1 g total) by mouth 4 (four) times daily -  with meals and at bedtime. 05/27/18  Yes Law, Bea Graff, PA-C  temazepam (RESTORIL) 30 MG capsule Take 30 mg by mouth at bedtime as needed for sleep. 01/27/18  Yes [provider]  azithromycin (ZITHROMAX Z-PAK) 250 MG tablet Take 1 tablet (250 mg total) by mouth daily. Take as directed Patient not taking: Reported on 04/23/2018 03/21/18   Lajean Saver, MD  doxycycline (VIBRAMYCIN) 100 MG capsule Take 1 capsule (100 mg total) by mouth 2 (two) times daily. Patient not taking:  Reported on 05/26/2018 04/24/18   Muthersbaugh, Jarrett Soho, PA-C  Maltodextrin-Xanthan Gum (RESOURCE THICKENUP CLEAR) POWD Add to liquids to make nectar thick consistency for dysphagia Patient not taking: Reported on 04/23/2018 02/10/18   Elodia Florence., MD  oxyCODONE (ROXICODONE) 15 MG immediate release tablet Take 1 tablet (15 mg total) by mouth every 8 (eight) hours as needed for pain. Patient not taking: Reported on 05/30/2018 02/17/18   Domenic Polite, MD    Family History Family History  Problem Relation Age of Onset  . COPD Mother   . Colon cancer Neg Hx     Social History Social History   Tobacco Use  . Smoking status: Current Every Day Smoker    Packs/day: 0.25    Years: 40.00    Pack years: 10.00    Types: Cigarettes  . Smokeless tobacco: Never Used  . Tobacco comment: hx 1/2 -1 PPD  Substance Use Topics  . Alcohol use: No    Comment: 05/23/2012 hx alcohol abuse, quit ~ 2007  . Drug use: No    Types: Marijuana    Comment: 05/23/2012 "used marijuana in my teens"     Allergies   Patient has no known allergies.   Review of Systems Review of Systems  Constitutional: Positive for activity change.  Respiratory: Negative for shortness of breath.   Cardiovascular: Negative for chest pain.  Gastrointestinal: Positive for abdominal pain, nausea and vomiting.  Allergic/Immunologic: Negative for immunocompromised state.  Hematological: Does not bruise/bleed easily.  All other systems reviewed and are negative.    Physical Exam Updated Vital Signs BP 127/82   Pulse 60   Temp (!) 97.4 F (36.3 C) (Oral)   Resp 17   SpO2 96%   Physical Exam  Constitutional: He is oriented to person, place, and time. He appears ill.  Cachectic male  HENT:  Head: Atraumatic.  Neck: Neck supple.  Cardiovascular: Normal rate.  Pulmonary/Chest: Effort normal.  Abdominal: There is generalized tenderness and tenderness in the epigastric area. There is guarding. There is no rebound.   Neurological: He is alert and oriented to person, place, and time.  Skin: Skin is warm.  Nursing note and vitals reviewed.    ED Treatments / Results  Labs (all labs ordered are listed, but only abnormal results are displayed) Labs Reviewed  COMPREHENSIVE METABOLIC PANEL - Abnormal; Notable for the following components:  Result Value   Potassium 3.4 (*)    Glucose, Bld 59 (*)    Creatinine, Ser 0.54 (*)    Calcium 8.8 (*)    Albumin 3.1 (*)    AST 135 (*)    ALT 77 (*)    All other components within normal limits  CBC - Abnormal; Notable for the following components:   WBC 2.9 (*)    RBC 3.53 (*)    Hemoglobin 12.1 (*)    HCT 35.3 (*)    MCH 34.3 (*)    RDW 15.9 (*)    All other components within normal limits  RAPID URINE DRUG SCREEN, HOSP PERFORMED - Abnormal; Notable for the following components:   Opiates POSITIVE (*)    Cocaine POSITIVE (*)    Benzodiazepines POSITIVE (*)    All other components within normal limits  LIPASE, BLOOD  URINALYSIS, ROUTINE W REFLEX MICROSCOPIC  RAPID HIV SCREEN (HIV 1/2 AB+AG)    EKG None  Radiology Ct Chest W Contrast  Result Date: 05/30/2018 CLINICAL DATA:  62 year old male with history of pancreatic and throat cancer presenting with abdominal pain. EXAM: CT CHEST, ABDOMEN, AND PELVIS WITH CONTRAST TECHNIQUE: Multidetector CT imaging of the chest, abdomen and pelvis was performed following the standard protocol during bolus administration of intravenous contrast. CONTRAST:  196mL ISOVUE-300 IOPAMIDOL (ISOVUE-300) INJECTION 61% COMPARISON:  CT of the abdomen pelvis dated 05/26/2018 and 02/07/2018 and chest CT dated 02/07/2018 FINDINGS: CT CHEST FINDINGS Cardiovascular: There is no cardiomegaly. There is pericardial effusion primarily inferior to the heart. Mild atherosclerotic calcification of the thoracic aorta. The origins of the great vessels of the aortic arch are patent. The central pulmonary arteries are patent as visualized.  Mediastinum/Nodes: No hilar adenopathy. Enlarged mediastinal lymph nodes measure 15 mm in the AP window. Oral contrast is noted within the esophagus. Lungs/Pleura: Small and loculated appearing right pleural effusions with no significant interval change in size since the CT of 02/07/2018. There are subsegmental subpleural consolidative changes of the lower lobes, likely atelectasis. Pneumonia is not excluded. There is background of emphysema. Endobronchial debris noted in the right mainstem bronchus and bilateral lower lobe bronchi which may represent secretions or aspiration. There is no pneumothorax. Musculoskeletal: There is diffuse subcutaneous edema and severe cachexia. No acute osseous pathology. CT ABDOMEN PELVIS FINDINGS Hepatobiliary: The liver is unremarkable as visualized. No intrahepatic biliary ductal dilatation. Cholecystectomy. There is air in the common bile duct. There is a 1.8 x 3.6 cm loculated appearing fluid content in the right upper quadrant adjacent to the cholecystectomy clips which is not characterized but may represent a loculated seroma or a biloma. Pancreas: Scattered calcification of the pancreas likely sequela of chronic pancreatitis. The pancreas is poorly visualized and suboptimally evaluated. There is however mild dilatation of the main pancreatic duct measuring approximately 5 mm. Spleen: Normal in size without focal abnormality. Adrenals/Urinary Tract: The adrenal glands are unremarkable. There is no hydronephrosis on either side. Subcentimeter right renal hypodense lesion is too small to characterize. The urinary bladder is distended and grossly unremarkable as visualized. Stomach/Bowel: No bowel obstruction. A tubular appearing structure in the right hemipelvis (coronal series 5 images 61-68) likely represents a normal appendix. Vascular/Lymphatic: There is advanced aortoiliac atherosclerotic disease. No portal venous gas. Evaluation of lymph node is very limited due to  cachexia. Reproductive: The prostate and seminal vesicles are poorly visualized. Other: Diffuse subcutaneous edema, anasarca and severe cachexia. Musculoskeletal: No acute osseous pathology. IMPRESSION: 1. Diffuse subcutaneous edema and anasarca as  well as severe cachexia limiting evaluation. 2. Loculated appearing small right pleural effusion without significant interval change. 3. Bilateral lower lobe subpleural atelectasis or infiltrate. 4. Bilateral lower lobe endobronchial mucus secretion versus aspiration. 5. Mildly enlarged mediastinal lymph nodes 6. Loculated appearing fluid adjacent to the cholecystectomy may represent seroma or biloma. 7. No bowel obstruction. Electronically Signed   By: Anner Crete M.D.   On: 05/30/2018 06:30   Ct Abdomen Pelvis W Contrast  Result Date: 05/30/2018 CLINICAL DATA:  62 year old male with history of pancreatic and throat cancer presenting with abdominal pain. EXAM: CT CHEST, ABDOMEN, AND PELVIS WITH CONTRAST TECHNIQUE: Multidetector CT imaging of the chest, abdomen and pelvis was performed following the standard protocol during bolus administration of intravenous contrast. CONTRAST:  150mL ISOVUE-300 IOPAMIDOL (ISOVUE-300) INJECTION 61% COMPARISON:  CT of the abdomen pelvis dated 05/26/2018 and 02/07/2018 and chest CT dated 02/07/2018 FINDINGS: CT CHEST FINDINGS Cardiovascular: There is no cardiomegaly. There is pericardial effusion primarily inferior to the heart. Mild atherosclerotic calcification of the thoracic aorta. The origins of the great vessels of the aortic arch are patent. The central pulmonary arteries are patent as visualized. Mediastinum/Nodes: No hilar adenopathy. Enlarged mediastinal lymph nodes measure 15 mm in the AP window. Oral contrast is noted within the esophagus. Lungs/Pleura: Small and loculated appearing right pleural effusions with no significant interval change in size since the CT of 02/07/2018. There are subsegmental subpleural  consolidative changes of the lower lobes, likely atelectasis. Pneumonia is not excluded. There is background of emphysema. Endobronchial debris noted in the right mainstem bronchus and bilateral lower lobe bronchi which may represent secretions or aspiration. There is no pneumothorax. Musculoskeletal: There is diffuse subcutaneous edema and severe cachexia. No acute osseous pathology. CT ABDOMEN PELVIS FINDINGS Hepatobiliary: The liver is unremarkable as visualized. No intrahepatic biliary ductal dilatation. Cholecystectomy. There is air in the common bile duct. There is a 1.8 x 3.6 cm loculated appearing fluid content in the right upper quadrant adjacent to the cholecystectomy clips which is not characterized but may represent a loculated seroma or a biloma. Pancreas: Scattered calcification of the pancreas likely sequela of chronic pancreatitis. The pancreas is poorly visualized and suboptimally evaluated. There is however mild dilatation of the main pancreatic duct measuring approximately 5 mm. Spleen: Normal in size without focal abnormality. Adrenals/Urinary Tract: The adrenal glands are unremarkable. There is no hydronephrosis on either side. Subcentimeter right renal hypodense lesion is too small to characterize. The urinary bladder is distended and grossly unremarkable as visualized. Stomach/Bowel: No bowel obstruction. A tubular appearing structure in the right hemipelvis (coronal series 5 images 61-68) likely represents a normal appendix. Vascular/Lymphatic: There is advanced aortoiliac atherosclerotic disease. No portal venous gas. Evaluation of lymph node is very limited due to cachexia. Reproductive: The prostate and seminal vesicles are poorly visualized. Other: Diffuse subcutaneous edema, anasarca and severe cachexia. Musculoskeletal: No acute osseous pathology. IMPRESSION: 1. Diffuse subcutaneous edema and anasarca as well as severe cachexia limiting evaluation. 2. Loculated appearing small right  pleural effusion without significant interval change. 3. Bilateral lower lobe subpleural atelectasis or infiltrate. 4. Bilateral lower lobe endobronchial mucus secretion versus aspiration. 5. Mildly enlarged mediastinal lymph nodes 6. Loculated appearing fluid adjacent to the cholecystectomy may represent seroma or biloma. 7. No bowel obstruction. Electronically Signed   By: Anner Crete M.D.   On: 05/30/2018 06:30    Procedures Procedures (including critical care time)  Angiocath insertion Performed by: Searcy Miyoshi  Consent: Verbal consent obtained. Risks and benefits: risks,  benefits and alternatives were discussed Time out: Immediately prior to procedure a "time out" was called to verify the correct patient, procedure, equipment, support staff and site/side marked as required.  Preparation: Patient was prepped and draped in the usual sterile fashion.  Vein Location: left basilic vein  Ultrasound Guided  Gauge: 20  Normal blood return and flush without difficulty Patient tolerance: Patient tolerated the procedure well with no immediate complications.    Angiocath insertion Performed by: Easton Sivertson  Consent: Verbal consent obtained. Risks and benefits: risks, benefits and alternatives were discussed Time out: Immediately prior to procedure a "time out" was called to verify the correct patient, procedure, equipment, support staff and site/side marked as required.  Preparation: Patient was prepped and draped in the usual sterile fashion.  Vein Location: right basilic vein  Ultrasound Guided  Gauge: 20  Normal blood return and flush without difficulty Patient tolerance: Patient tolerated the procedure well with no immediate complications.      Medications Ordered in ED Medications  iopamidol (ISOVUE-300) 61 % injection (has no administration in time range)  oxyCODONE-acetaminophen (PERCOCET/ROXICET) 5-325 MG per tablet 1 tablet (has no administration in  time range)  fentaNYL (SUBLIMAZE) injection 35 mcg (35 mcg Intravenous Given 05/30/18 0432)  ondansetron (ZOFRAN) injection 4 mg (4 mg Intravenous Given 05/30/18 0432)  iopamidol (ISOVUE-300) 61 % injection 100 mL (100 mLs Intravenous Contrast Given 05/30/18 0502)     Initial Impression / Assessment and Plan / ED Course  I have reviewed the triage vital signs and the nursing notes.  Pertinent labs & imaging results that were available during my care of the patient were reviewed by me and considered in my medical decision making (see chart for details).  Clinical Course as of May 30 733  Tue May 30, 2018  0504 I placed a left-sided ultrasound-guided peripheral IV.  Labs were drawn from that site.  When nurse went in to give patient medication, the IV blew.  I placed another IV on the right side.  Patient is ready for CT scan.   [AN]  0630 UA is positive for cocaine and benzos. Patient continues to complain of abdominal pain.   Urine rapid drug screen (hosp performed)(!) [AN]  0631 Labs overall appear reassuring. CT results pending.   [AN]  [phone] Social work and case management consult placed. Patient does not meet criteria for admission at this time. He needs optimal outpatient management and I think home nursing will help.   [AN]    Clinical Course User Index [AN] Varney Biles, MD    62 year old male comes in with chief complaint of abdominal pain.  Patient also reports nausea, vomiting, weight loss.  He has history of cancer in the past.  Patient was seen recently with the same abdominal pain.  At that time he had a CT abdomen pelvis without contrast which did not reveal any acute findings.  In the interim, patient continues to lose weight and continues to have abdominal pain with nausea and vomiting.  His pain is radiating up towards his throat.  He has history of throat cancer.  CT soft tissue neck from May reviewed. CT abdomen and pelvis and CT chest within the last 6 months  also reviewed.  Based on my assessment and review of patient's prior record, we will proceed with CT chest and CT abdomen pelvis with contrast. He has no dysphagia or odynophagia -and the last CT soft tissue neck was completely normal, therefore we will not get CT  soft tissue neck at this time.   Final Clinical Impressions(s) / ED Diagnoses   Final diagnoses:  Muscular atrophy, unspecified site    ED Discharge Orders    None       Varney Biles, MD 05/30/18 5329    Varney Biles, MD 05/30/18 [place], [place], MD 05/30/18 [phone]

## 2018-06-09 ENCOUNTER — Emergency Department (HOSPITAL_COMMUNITY): Payer: Medicaid Other

## 2018-06-09 ENCOUNTER — Encounter (HOSPITAL_COMMUNITY): Payer: Self-pay | Admitting: Emergency Medicine

## 2018-06-09 ENCOUNTER — Inpatient Hospital Stay (HOSPITAL_COMMUNITY): Payer: Medicaid Other

## 2018-06-09 ENCOUNTER — Inpatient Hospital Stay (HOSPITAL_COMMUNITY)
Admission: EM | Admit: 2018-06-09 | Discharge: 2018-06-26 | DRG: 871 | Disposition: A | Discharge status: Home Health | Payer: Medicaid Other | Attending: Internal Medicine | Admitting: Internal Medicine

## 2018-06-09 DIAGNOSIS — F1410 Cocaine abuse, uncomplicated: Secondary | ICD-10-CM | POA: Diagnosis present

## 2018-06-09 DIAGNOSIS — Z23 Encounter for immunization: Secondary | ICD-10-CM | POA: Diagnosis not present

## 2018-06-09 DIAGNOSIS — K860 Alcohol-induced chronic pancreatitis: Secondary | ICD-10-CM | POA: Diagnosis present

## 2018-06-09 DIAGNOSIS — I10 Essential (primary) hypertension: Secondary | ICD-10-CM | POA: Diagnosis not present

## 2018-06-09 DIAGNOSIS — L89619 Pressure ulcer of right heel, unspecified stage: Secondary | ICD-10-CM | POA: Diagnosis present

## 2018-06-09 DIAGNOSIS — E43 Unspecified severe protein-calorie malnutrition: Secondary | ICD-10-CM | POA: Diagnosis not present

## 2018-06-09 DIAGNOSIS — R109 Unspecified abdominal pain: Secondary | ICD-10-CM | POA: Diagnosis present

## 2018-06-09 DIAGNOSIS — L89629 Pressure ulcer of left heel, unspecified stage: Secondary | ICD-10-CM | POA: Diagnosis present

## 2018-06-09 DIAGNOSIS — Z09 Encounter for follow-up examination after completed treatment for conditions other than malignant neoplasm: Secondary | ICD-10-CM

## 2018-06-09 DIAGNOSIS — D638 Anemia in other chronic diseases classified elsewhere: Secondary | ICD-10-CM | POA: Diagnosis not present

## 2018-06-09 DIAGNOSIS — A419 Sepsis, unspecified organism: Secondary | ICD-10-CM | POA: Diagnosis present

## 2018-06-09 DIAGNOSIS — Z79899 Other long term (current) drug therapy: Secondary | ICD-10-CM

## 2018-06-09 DIAGNOSIS — M542 Cervicalgia: Secondary | ICD-10-CM | POA: Diagnosis present

## 2018-06-09 DIAGNOSIS — Z8507 Personal history of malignant neoplasm of pancreas: Secondary | ICD-10-CM

## 2018-06-09 DIAGNOSIS — R4702 Dysphasia: Secondary | ICD-10-CM | POA: Diagnosis present

## 2018-06-09 DIAGNOSIS — R64 Cachexia: Secondary | ICD-10-CM | POA: Diagnosis present

## 2018-06-09 DIAGNOSIS — J9811 Atelectasis: Secondary | ICD-10-CM | POA: Diagnosis present

## 2018-06-09 DIAGNOSIS — J690 Pneumonitis due to inhalation of food and vomit: Secondary | ICD-10-CM | POA: Diagnosis present

## 2018-06-09 DIAGNOSIS — I498 Other specified cardiac arrhythmias: Secondary | ICD-10-CM | POA: Diagnosis present

## 2018-06-09 DIAGNOSIS — R945 Abnormal results of liver function studies: Secondary | ICD-10-CM | POA: Diagnosis present

## 2018-06-09 DIAGNOSIS — E162 Hypoglycemia, unspecified: Secondary | ICD-10-CM | POA: Diagnosis present

## 2018-06-09 DIAGNOSIS — F329 Major depressive disorder, single episode, unspecified: Secondary | ICD-10-CM | POA: Diagnosis present

## 2018-06-09 DIAGNOSIS — J449 Chronic obstructive pulmonary disease, unspecified: Secondary | ICD-10-CM

## 2018-06-09 DIAGNOSIS — R140 Abdominal distension (gaseous): Secondary | ICD-10-CM

## 2018-06-09 DIAGNOSIS — E440 Moderate protein-calorie malnutrition: Secondary | ICD-10-CM

## 2018-06-09 DIAGNOSIS — E860 Dehydration: Secondary | ICD-10-CM | POA: Diagnosis present

## 2018-06-09 DIAGNOSIS — J9611 Chronic respiratory failure with hypoxia: Secondary | ICD-10-CM | POA: Diagnosis present

## 2018-06-09 DIAGNOSIS — J181 Lobar pneumonia, unspecified organism: Secondary | ICD-10-CM | POA: Diagnosis not present

## 2018-06-09 DIAGNOSIS — D61818 Other pancytopenia: Secondary | ICD-10-CM | POA: Diagnosis present

## 2018-06-09 DIAGNOSIS — Z85818 Personal history of malignant neoplasm of other sites of lip, oral cavity, and pharynx: Secondary | ICD-10-CM

## 2018-06-09 DIAGNOSIS — Z8546 Personal history of malignant neoplasm of prostate: Secondary | ICD-10-CM

## 2018-06-09 DIAGNOSIS — R1310 Dysphagia, unspecified: Secondary | ICD-10-CM | POA: Diagnosis present

## 2018-06-09 DIAGNOSIS — Z720 Tobacco use: Secondary | ICD-10-CM | POA: Diagnosis not present

## 2018-06-09 DIAGNOSIS — K219 Gastro-esophageal reflux disease without esophagitis: Secondary | ICD-10-CM | POA: Diagnosis present

## 2018-06-09 DIAGNOSIS — Z515 Encounter for palliative care: Secondary | ICD-10-CM | POA: Diagnosis not present

## 2018-06-09 DIAGNOSIS — G9341 Metabolic encephalopathy: Secondary | ICD-10-CM | POA: Diagnosis present

## 2018-06-09 DIAGNOSIS — Z7189 Other specified counseling: Secondary | ICD-10-CM

## 2018-06-09 DIAGNOSIS — E876 Hypokalemia: Secondary | ICD-10-CM | POA: Diagnosis not present

## 2018-06-09 DIAGNOSIS — R627 Adult failure to thrive: Secondary | ICD-10-CM | POA: Diagnosis not present

## 2018-06-09 DIAGNOSIS — K8681 Exocrine pancreatic insufficiency: Secondary | ICD-10-CM | POA: Diagnosis present

## 2018-06-09 DIAGNOSIS — Z681 Body mass index (BMI) 19 or less, adult: Secondary | ICD-10-CM

## 2018-06-09 DIAGNOSIS — R6251 Failure to thrive (child): Secondary | ICD-10-CM

## 2018-06-09 DIAGNOSIS — E039 Hypothyroidism, unspecified: Secondary | ICD-10-CM | POA: Diagnosis not present

## 2018-06-09 DIAGNOSIS — R0600 Dyspnea, unspecified: Secondary | ICD-10-CM

## 2018-06-09 DIAGNOSIS — T68XXXA Hypothermia, initial encounter: Secondary | ICD-10-CM | POA: Diagnosis present

## 2018-06-09 DIAGNOSIS — F17210 Nicotine dependence, cigarettes, uncomplicated: Secondary | ICD-10-CM | POA: Diagnosis present

## 2018-06-09 DIAGNOSIS — K7200 Acute and subacute hepatic failure without coma: Secondary | ICD-10-CM | POA: Diagnosis present

## 2018-06-09 DIAGNOSIS — R7989 Other specified abnormal findings of blood chemistry: Secondary | ICD-10-CM | POA: Diagnosis present

## 2018-06-09 DIAGNOSIS — L89150 Pressure ulcer of sacral region, unstageable: Secondary | ICD-10-CM | POA: Diagnosis present

## 2018-06-09 DIAGNOSIS — Z923 Personal history of irradiation: Secondary | ICD-10-CM

## 2018-06-09 DIAGNOSIS — J189 Pneumonia, unspecified organism: Secondary | ICD-10-CM

## 2018-06-09 DIAGNOSIS — Z22322 Carrier or suspected carrier of Methicillin resistant Staphylococcus aureus: Secondary | ICD-10-CM

## 2018-06-09 LAB — LIPASE, BLOOD: LIPASE: 20 U/L (ref 11–51)

## 2018-06-09 LAB — URINALYSIS, ROUTINE W REFLEX MICROSCOPIC
BILIRUBIN URINE: NEGATIVE
Glucose, UA: >=500 mg/dL — AB
HGB URINE DIPSTICK: NEGATIVE
Ketones, ur: NEGATIVE mg/dL
Leukocytes, UA: NEGATIVE
NITRITE: NEGATIVE
Protein, ur: NEGATIVE mg/dL
SPECIFIC GRAVITY, URINE: 1.015 (ref 1.005–1.030)
pH: 5 (ref 5.0–8.0)

## 2018-06-09 LAB — CBG MONITORING, ED
GLUCOSE-CAPILLARY: 68 mg/dL — AB (ref 70–99)
GLUCOSE-CAPILLARY: 151 mg/dL — AB (ref 70–99)
GLUCOSE-CAPILLARY: 200 mg/dL — AB (ref 70–99)
Glucose-Capillary: 89 mg/dL (ref 70–99)
Glucose-Capillary: 112 mg/dL — ABNORMAL HIGH (ref 70–99)

## 2018-06-09 LAB — CBC WITH DIFFERENTIAL/PLATELET
BASOS ABS: 0 10*3/uL (ref 0.0–0.1)
Basophils Relative: 1 %
EOS ABS: 0 10*3/uL (ref 0.0–0.7)
Eosinophils Relative: 0 %
HCT: 28.6 % — ABNORMAL LOW (ref 39.0–52.0)
HEMOGLOBIN: 9.3 g/dL — AB (ref 13.0–17.0)
LYMPHS PCT: 4 %
Lymphs Abs: 0.1 10*3/uL — ABNORMAL LOW (ref 0.7–4.0)
MCH: 33.7 pg (ref 26.0–34.0)
MCHC: 32.5 g/dL (ref 30.0–36.0)
MCV: 103.6 fL — ABNORMAL HIGH (ref 78.0–100.0)
MONO ABS: 0.2 10*3/uL (ref 0.1–1.0)
Monocytes Relative: 8 %
Neutro Abs: 2.1 10*3/uL (ref 1.7–7.7)
Neutrophils Relative %: 87 %
PLATELETS: 137 10*3/uL — AB (ref 150–400)
RBC: 2.76 MIL/uL — ABNORMAL LOW (ref 4.22–5.81)
RDW: 15.7 % — AB (ref 11.5–15.5)
WBC Morphology: INCREASED
WBC: 2.4 10*3/uL — ABNORMAL LOW (ref 4.0–10.5)

## 2018-06-09 LAB — I-STAT CHEM 8, ED
BUN: 43 mg/dL — AB (ref 8–23)
CHLORIDE: 94 mmol/L — AB (ref 98–111)
Calcium, Ion: 1.05 mmol/L — ABNORMAL LOW (ref 1.15–1.40)
Creatinine, Ser: 0.7 mg/dL (ref 0.61–1.24)
Glucose, Bld: 234 mg/dL — ABNORMAL HIGH (ref 70–99)
HEMATOCRIT: 30 % — AB (ref 39.0–52.0)
Hemoglobin: 10.2 g/dL — ABNORMAL LOW (ref 13.0–17.0)
POTASSIUM: 3.9 mmol/L (ref 3.5–5.1)
SODIUM: 132 mmol/L — AB (ref 135–145)
TCO2: 29 mmol/L (ref 22–32)

## 2018-06-09 LAB — COMPREHENSIVE METABOLIC PANEL
ALBUMIN: 2.3 g/dL — AB (ref 3.5–5.0)
ALK PHOS: 221 U/L — AB (ref 38–126)
ALT: 158 U/L — ABNORMAL HIGH (ref 0–44)
ANION GAP: 8 (ref 5–15)
AST: 498 U/L — ABNORMAL HIGH (ref 15–41)
BUN: 34 mg/dL — ABNORMAL HIGH (ref 8–23)
CHLORIDE: 97 mmol/L — AB (ref 98–111)
CO2: 28 mmol/L (ref 22–32)
Calcium: 7.6 mg/dL — ABNORMAL LOW (ref 8.9–10.3)
Creatinine, Ser: 0.65 mg/dL (ref 0.61–1.24)
GFR calc Af Amer: >60 mL/min (ref >60)
GFR calc non Af Amer: >60 mL/min (ref >60)
GLUCOSE: 238 mg/dL — AB (ref 70–99)
POTASSIUM: 3.7 mmol/L (ref 3.5–5.1)
SODIUM: 133 mmol/L — AB (ref 135–145)
Total Bilirubin: 0.9 mg/dL (ref 0.3–1.2)
Total Protein: 5.6 g/dL — ABNORMAL LOW (ref 6.5–8.1)

## 2018-06-09 LAB — I-STAT TROPONIN, ED: Troponin i, poc: 0.05 ng/mL (ref 0.00–0.08)

## 2018-06-09 LAB — I-STAT CG4 LACTIC ACID, ED
Lactic Acid, Venous: 1.41 mmol/L (ref 0.5–1.9)
Lactic Acid, Venous: 1.26 mmol/L (ref 0.5–1.9)

## 2018-06-09 LAB — CORTISOL-AM, BLOOD: CORTISOL - AM: 32.4 ug/dL — AB (ref 6.7–22.6)

## 2018-06-09 MED ORDER — SODIUM CHLORIDE 0.9 % IV SOLN
1.0000 g | INTRAVENOUS | Status: DC
  Administered 2018-06-10: 1 g via INTRAVENOUS
  Filled 2018-06-09 (×2): qty 10

## 2018-06-09 MED ORDER — IOPAMIDOL (ISOVUE-300) INJECTION 61%: 100.0000 mL | Freq: Once | INTRAVENOUS | Status: DC | PRN

## 2018-06-09 MED ORDER — NICOTINE 21 MG/24HR TD PT24
21.0000 mg | MEDICATED_PATCH | Freq: Every day | TRANSDERMAL | Status: DC
  Administered 2018-06-10 – 2018-06-26 (×17): 21 mg via TRANSDERMAL
  Filled 2018-06-09 (×17): qty 1

## 2018-06-09 MED ORDER — METRONIDAZOLE 500 MG PO TABS
250.0000 mg | ORAL_TABLET | Freq: Three times a day (TID) | ORAL | Status: DC
  Administered 2018-06-10 – 2018-06-11 (×4): 250 mg via ORAL
  Filled 2018-06-09 (×4): qty 1

## 2018-06-09 MED ORDER — GLUCAGON HCL RDNA (DIAGNOSTIC) 1 MG IJ SOLR
INTRAMUSCULAR | Status: AC
  Administered 2018-06-09: 1 mg
  Filled 2018-06-09: qty 1

## 2018-06-09 MED ORDER — SODIUM CHLORIDE 0.9 % IV SOLN
1.0000 g | Freq: Once | INTRAVENOUS | Status: AC
  Administered 2018-06-09: 1 g via INTRAVENOUS
  Filled 2018-06-09: qty 10

## 2018-06-09 MED ORDER — HYDRALAZINE HCL 20 MG/ML IJ SOLN: 5.0000 mg | INTRAMUSCULAR | Status: DC | PRN

## 2018-06-09 MED ORDER — ENSURE ENLIVE PO LIQD
474.0000 mL | Freq: Three times a day (TID) | ORAL | Status: AC
  Administered 2018-06-10 – 2018-06-15 (×14): 474 mL via ORAL
  Administered 2018-06-15: 237 mL via ORAL
  Administered 2018-06-18 (×3): 474 mL via ORAL

## 2018-06-09 MED ORDER — IOPAMIDOL (ISOVUE-300) INJECTION 61%
INTRAVENOUS | Status: AC
  Filled 2018-06-09: qty 30

## 2018-06-09 MED ORDER — ONDANSETRON HCL 4 MG/2ML IJ SOLN
4.0000 mg | Freq: Three times a day (TID) | INTRAMUSCULAR | Status: DC | PRN
  Administered 2018-06-13: 4 mg via INTRAVENOUS
  Filled 2018-06-09: qty 2

## 2018-06-09 MED ORDER — HYDROCORTISONE NA SUCCINATE PF 100 MG IJ SOLR
100.0000 mg | Freq: Once | INTRAMUSCULAR | Status: DC
  Filled 2018-06-09: qty 2

## 2018-06-09 MED ORDER — SUCRALFATE 1 GM/10ML PO SUSP
1.0000 g | Freq: Three times a day (TID) | ORAL | Status: DC
  Administered 2018-06-10 – 2018-06-20 (×38): 1 g via ORAL
  Filled 2018-06-09 (×39): qty 10

## 2018-06-09 MED ORDER — DM-GUAIFENESIN ER 30-600 MG PO TB12
1.0000 | ORAL_TABLET | Freq: Two times a day (BID) | ORAL | Status: DC | PRN
  Filled 2018-06-09: qty 1

## 2018-06-09 MED ORDER — DEXTROSE 10 % IV SOLN
INTRAVENOUS | Status: DC
  Administered 2018-06-09 – 2018-06-10 (×2): via INTRAVENOUS

## 2018-06-09 MED ORDER — SODIUM CHLORIDE 0.9 % IV SOLN
500.0000 mg | Freq: Once | INTRAVENOUS | Status: AC
  Administered 2018-06-09: 500 mg via INTRAVENOUS
  Filled 2018-06-09: qty 500

## 2018-06-09 MED ORDER — ZOLPIDEM TARTRATE 5 MG PO TABS
5.0000 mg | ORAL_TABLET | Freq: Every evening | ORAL | Status: DC | PRN
  Administered 2018-06-10 – 2018-06-19 (×8): 5 mg via ORAL
  Filled 2018-06-09 (×8): qty 1

## 2018-06-09 MED ORDER — DEXTROSE 50 % IV SOLN
INTRAVENOUS | Status: AC
  Filled 2018-06-09: qty 50

## 2018-06-09 MED ORDER — MIRTAZAPINE 15 MG PO TABS
30.0000 mg | ORAL_TABLET | Freq: Every day | ORAL | Status: DC
  Administered 2018-06-10 – 2018-06-23 (×13): 30 mg via ORAL
  Filled 2018-06-09 (×14): qty 2

## 2018-06-09 MED ORDER — IOPAMIDOL (ISOVUE-300) INJECTION 61%
INTRAVENOUS | Status: AC
  Filled 2018-06-09: qty 100

## 2018-06-09 MED ORDER — ENOXAPARIN SODIUM 40 MG/0.4ML ~~LOC~~ SOLN
40.0000 mg | SUBCUTANEOUS | Status: DC
  Administered 2018-06-10 – 2018-06-15 (×6): 40 mg via SUBCUTANEOUS
  Filled 2018-06-09 (×6): qty 0.4

## 2018-06-09 MED ORDER — DEXTROSE 50 % IV SOLN
50.0000 mL | INTRAVENOUS | Status: DC | PRN
  Administered 2018-06-17 – 2018-06-25 (×11): 50 mL via INTRAVENOUS
  Filled 2018-06-09 (×10): qty 50

## 2018-06-09 MED ORDER — PANTOPRAZOLE SODIUM 20 MG PO TBEC
20.0000 mg | DELAYED_RELEASE_TABLET | Freq: Two times a day (BID) | ORAL | Status: DC
  Administered 2018-06-10 – 2018-06-24 (×28): 20 mg via ORAL
  Filled 2018-06-09 (×29): qty 1

## 2018-06-09 MED ORDER — ALBUTEROL SULFATE (2.5 MG/3ML) 0.083% IN NEBU: 2.5000 mg | INHALATION_SOLUTION | RESPIRATORY_TRACT | Status: DC | PRN

## 2018-06-09 MED ORDER — OXYCODONE HCL 5 MG PO TABS
5.0000 mg | ORAL_TABLET | Freq: Four times a day (QID) | ORAL | Status: DC | PRN
  Administered 2018-06-10 – 2018-06-13 (×13): 5 mg via ORAL
  Filled 2018-06-09 (×13): qty 1

## 2018-06-09 MED ORDER — SODIUM CHLORIDE 0.9 % IV BOLUS
1000.0000 mL | Freq: Once | INTRAVENOUS | Status: AC
  Administered 2018-06-09: 1000 mL via INTRAVENOUS

## 2018-06-09 MED ORDER — SODIUM CHLORIDE 0.9 % IV SOLN
500.0000 mg | INTRAVENOUS | Status: DC
  Administered 2018-06-10: 500 mg via INTRAVENOUS
  Filled 2018-06-09 (×2): qty 500

## 2018-06-09 MED ORDER — PANCRELIPASE (LIP-PROT-AMYL) 12000-38000 UNITS PO CPEP
12000.0000 [IU] | ORAL_CAPSULE | Freq: Three times a day (TID) | ORAL | Status: DC
  Administered 2018-06-10 – 2018-06-26 (×63): 12000 [IU] via ORAL
  Filled 2018-06-09 (×63): qty 1

## 2018-06-09 MED ORDER — FOLIC ACID 1 MG PO TABS
1.0000 mg | ORAL_TABLET | Freq: Every day | ORAL | Status: DC
  Administered 2018-06-10 – 2018-06-20 (×11): 1 mg via ORAL
  Filled 2018-06-09 (×11): qty 1

## 2018-06-09 NOTE — ED Notes (Signed)
Unable to get a oral / axillary temp. Charted rectal temp. Placed pt under bair hugger.

## 2018-06-09 NOTE — ED Provider Notes (Signed)
[facility] EMERGENCY DEPARTMENT Provider Note   CSN: [id number] Arrival date & time: 06/09/18  1638     History   Chief Complaint Chief Complaint  Patient presents with  . Hypoglycemia    HPI Russell Williamson is a 62 y.o. male hx of GERD, known pancreatic cancer here presenting with hypoglycemia, altered mental status.  Patient has known throat cancer and pancreatic cancer and apparently was in remission.  Patient was seen in the ED about a week ago and showed a possible pneumonia versus atelectasis.  Over the last week, patient has been having persistent poor appetite.  Patient states that he feels lightheaded and dizzy today.  Family noticed that he was altered and EMS was called.  His initial CBG was 11, given 3 amps of D10 and CBG only came up to 64.  Patient was also noted to be hypotensive was given 500 cc normal saline bolus as well.  Patient denies any vomiting or fevers.   The history is provided by the patient and the EMS personnel.   Level V caveat- AMS   Past Medical History:  Diagnosis Date  . Anemia   . Arthritis   . Cervical adenopathy 04/26/2012  . Chronic back pain    lower  . Chronic pancreatitis ([place])   . Constipation   . COPD (chronic obstructive pulmonary disease) ([place])   . Daily headache 05/23/2012   "small ones"  . Deficiency anemia 04/15/2016  . Dyspnea    with exertion  . G tube feedings (HCC)    hx of  feeding tube in stomach  . GERD (gastroesophageal reflux disease)   . History of blood transfusion ~ 2004   "from the pancreatitis"  . History of radiation therapy 02/02/2011-03/22/2011   head/neck,left tonsil  . Hx of radiation therapy 02/02/11 to 03/22/11   L tonsil  . Hypertension   . Insomnia   . 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 Pancytopenia ([place]) 02/28/2014  . Poor venous access 04/28/2016  . Seizures ([place])    alcohol-related last seizure 3 yrs ago  . Thrush 11/02/2013  . 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 Urinary hesitancy     Patient Active Problem List   Diagnosis Date Noted  . Acute respiratory failure with hypoxia ([place]) 02/14/2018  . Glasgow coma scale total score 3-8 ([place])   . Respiratory failure ([place])   . Sepsis ([place])   . Pressure injury of skin 02/08/2018  . COPD with chronic bronchitis ([place]) 02/06/2018  . CAP (community acquired pneumonia) 02/06/2018  . Syncope 02/06/2018  . Hyponatremia 02/06/2018  . Dehydration 02/06/2018  . Fall at home, initial encounter 02/06/2018  . Tobacco abuse 02/06/2018  . Protein-calorie malnutrition, severe 07/03/2017  . Abnormal EKG 07/02/2017  . Abdominal pain, epigastric   . COPD exacerbation ([place]) 01/27/2017  . Oral thrush 01/27/2017  . 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 Encounter for intubation 05/07/2016  . Port catheter in place 05/05/2016  . Poor venous access 04/28/2016  . Deficiency anemia 04/15/2016  . Chronic leukopenia 04/19/2015  . Anemia in chronic illness 04/19/2015  . Weight loss, unintentional 04/19/2015  . Relapsing chronic pancreatitis ([place]) 01/17/2015  . Diarrhea 09/04/2014  . 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 Malnutrition of moderate degree ([place]) 09/03/2014  . Hypokalemia 09/02/2014  . Chronic alcoholic pancreatitis ([place]) 09/01/2014  . Mucositis 06/17/2014  . Chronic neck pain 06/17/2014  . S/P gastrostomy ([place]) 06/17/2014  . Nicotine abuse  02/28/2014  . Pancytopenia ([place]) 02/28/2014  . Hypothyroidism (acquired) 11/02/2013  . Hypothyroidism 10/29/2013  . 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 Cervical adenopathy 04/26/2012  . Hiatal hernia   . Hx of radiation therapy   . HTN (hypertension) 10/16/2011  . History of cancer tonsil 08/20/2011    Past Surgical History:  Procedure Laterality Date  . BILE DUCT STENT PLACEMENT     hx of  . CHOLECYSTECTOMY  04/2005  . DIRECT LARYNGOSCOPY  05/23/2012   Procedure: DIRECT LARYNGOSCOPY;  Surgeon: Rozetta Nunnery, MD;  Location: [facility];  Service: ENT;   Laterality: N/A;  . DIRECT LARYNGOSCOPY N/A 03/23/2013   Procedure: DIRECT LARYNGOSCOPY;  Surgeon: Rozetta Nunnery, MD;  Location: [facility];  Service: ENT;  Laterality: N/A;  . ESOPHAGEAL DILATION N/A 03/23/2013   Procedure: ESOPHAGEAL DILATION;  Surgeon: Rozetta Nunnery, MD;  Location: [facility];  Service: ENT;  Laterality: N/A;  . ESOPHAGOGASTRODUODENOSCOPY (EGD) WITH PROPOFOL N/A 03/01/2017   Procedure: ESOPHAGOGASTRODUODENOSCOPY (EGD) WITH PROPOFOL;  Surgeon: Ladene Artist, MD;  Location: [facility];  Service: Endoscopy;  Laterality: N/A;  . FLEXIBLE SIGMOIDOSCOPY N/A 03/01/2017   Procedure: FLEXIBLE SIGMOIDOSCOPY;  Surgeon: Ladene Artist, MD;  Location: [facility];  Service: Endoscopy;  Laterality: N/A;  . IR GENERIC HISTORICAL  04/29/2016   IR US GUIDE VASC ACCESS RIGHT 04/29/2016 [facility]  . IR GENERIC HISTORICAL  04/29/2016   IR FLUORO GUIDE CV LINE RIGHT 04/29/2016 [facility]  . IR GENERIC HISTORICAL  05/12/2016   IR US GUIDE VASC ACCESS LEFT 05/12/2016 [facility]  . IR GENERIC HISTORICAL  05/12/2016   IR FLUORO GUIDE CV LINE LEFT 05/12/2016 [facility]  . IR GENERIC HISTORICAL  06/28/2016   IR US GUIDE VASC ACCESS RIGHT 06/28/2016 Sandi Mariscal, MD [facility]  . IR GENERIC HISTORICAL  06/28/2016   IR FLUORO GUIDE PORT INSERTION RIGHT 06/28/2016 Sandi Mariscal, MD [facility]  . IR REMOVAL TUN ACCESS W/ PORT W/O FL MOD SED  02/10/2017  . MASS BIOPSY  05/23/2012   Procedure: NECK MASS BIOPSY;  Surgeon: Rozetta Nunnery, MD;  Location: [facility];  Service: ENT;  Laterality: N/A;  . RADICAL NECK DISSECTION  05/23/2012   w/mass excision  . RADICAL NECK DISSECTION  05/23/2012   Procedure: RADICAL NECK DISSECTION;  Surgeon: Rozetta Nunnery, MD;  Location: [place] [facility];  Service: ENT;  Laterality: Left;  . TIBIA FRACTURE SURGERY  1990's   left        Home Medications    Prior to Admission medications   Medication Sig Start Date End Date  Taking? Authorizing Provider  amLODipine (NORVASC) 2.5 MG tablet Take 2.5 mg by mouth daily. 12/24/16   [provider]  amoxicillin-clavulanate (AUGMENTIN) 875-125 MG tablet Take 1 tablet by mouth every 12 (twelve) hours. 05/27/18   Law, Bea Graff, PA-C  azithromycin (ZITHROMAX Z-PAK) 250 MG tablet Take 1 tablet (250 mg total) by mouth daily. Take as directed Patient not taking: Reported on 04/23/2018 03/21/18   Lajean Saver, MD  doxycycline (VIBRAMYCIN) 100 MG capsule Take 1 capsule (100 mg total) by mouth 2 (two) times daily. Patient not taking: Reported on 05/26/2018 04/24/18   Muthersbaugh, Jarrett Soho, PA-C  feeding supplement, ENSURE ENLIVE, (ENSURE ENLIVE) LIQD Take 237 mLs by mouth 3 (three) times daily between meals. 02/10/18   Elodia Florence., MD  folic acid (FOLVITE) 1 MG tablet Take 1 mg by mouth daily.  05/14/15   [provider]  lipase/protease/amylase (CREON-12/PANCREASE) 12000 UNITS CPEP capsule Take 12,000 Units by mouth 4 (four) times daily.     [provider]  Maltodextrin-Xanthan Gum ([place]) POWD Add to liquids to make nectar thick consistency for dysphagia Patient not taking: Reported on 04/23/2018 02/10/18   Elodia Florence., MD  mirtazapine (REMERON) 30 MG tablet Take 30 mg by mouth at bedtime.    [provider]  oxyCODONE (ROXICODONE) 15 MG immediate release tablet Take 1 tablet (15 mg total) by mouth every 8 (eight) hours as needed for pain. Patient not taking: Reported on 05/30/2018 02/17/18   Domenic Polite, MD  pantoprazole (PROTONIX) 20 MG tablet Take 1 tablet (20 mg total) by mouth 2 (two) times daily. 05/27/18   Law, Bea Graff, PA-C  PROAIR HFA [phone] Base) MCG/ACT inhaler Inhale 1-2 puffs into the lungs every 6 (six) hours as needed for wheezing or shortness of breath.  02/02/16   [provider]  promethazine (PHENERGAN) 25 MG tablet Take 1 tablet (25 mg total) by mouth every 6 (six) hours as needed  for nausea or vomiting. 05/27/18   Law, Bea Graff, PA-C  sucralfate (CARAFATE) 1 GM/10ML suspension Take 10 mLs (1 g total) by mouth 4 (four) times daily -  with meals and at bedtime. 05/27/18   Law, Bea Graff, PA-C  temazepam (RESTORIL) 30 MG capsule Take 30 mg by mouth at bedtime as needed for sleep. 01/27/18   [provider]    Family History Family History  Problem Relation Age of Onset  . COPD Mother   . Colon cancer Neg Hx     Social History Social History   Tobacco Use  . Smoking status: Current Every Day Smoker    Packs/day: 0.25    Years: 40.00    Pack years: 10.00    Types: Cigarettes  . Smokeless tobacco: Never Used  . Tobacco comment: hx 1/2 -1 PPD  Substance Use Topics  . Alcohol use: No    Comment: 05/23/2012 hx alcohol abuse, quit ~ 2007  . Drug use: No    Types: Marijuana    Comment: 05/23/2012 "used marijuana in my teens"     Allergies   Patient has no known allergies.   Review of Systems Review of Systems  Neurological: Positive for dizziness and weakness.  All other systems reviewed and are negative.    Physical Exam Updated Vital Signs BP 96/70 (BP Location: Right Arm)   Pulse 61   Resp 16   Physical Exam  Constitutional:  Chronically ill, cachetic  HENT:  Head: Normocephalic.  MM dry   Eyes: Pupils are equal, round, and reactive to light. Conjunctivae and EOM are normal.  Neck: Normal range of motion. Neck supple.  Cardiovascular: Normal rate, regular rhythm and normal heart sounds.  Pulmonary/Chest:  Crackles bilateral bases   Abdominal: Soft. Bowel sounds are normal. He exhibits no distension. There is no tenderness.  Musculoskeletal: Normal range of motion. He exhibits no edema.  Neurological: He is alert.  A & O x 1. Moving all extremities   Skin: Skin is warm.  Psychiatric:  Unable   Nursing note and vitals reviewed.    ED Treatments / Results  Labs (all labs ordered are listed, but only abnormal results are  displayed) Labs Reviewed  URINALYSIS, ROUTINE W REFLEX MICROSCOPIC - Abnormal; Notable for the following components:      Result Value   Glucose, UA >=500 (*)    Bacteria, UA  RARE (*)    All other components within normal limits  CBG MONITORING, ED - Abnormal; Notable for the following components:   Glucose-Capillary 68 (*)    All other components within normal limits  CULTURE, BLOOD (ROUTINE X 2)  CULTURE, BLOOD (ROUTINE X 2)  CBC WITH DIFFERENTIAL/PLATELET  COMPREHENSIVE METABOLIC PANEL  LIPASE, BLOOD  CBG MONITORING, ED  I-STAT TROPONIN, ED  I-STAT CHEM 8, ED  I-STAT CG4 LACTIC ACID, ED    EKG EKG Interpretation  Date/Time:  Friday June 09 2018 16:50:06 EDT Ventricular Rate:  66 PR Interval:    QRS Duration: 97 QT Interval:  451 QTC Calculation: 473 R Axis:   86 Text Interpretation:  Atrial fibrillation Ventricular premature complex Anterior infarct, old Lead(s) I were not used for morphology analysis No significant change since last tracing Confirmed by Wandra Arthurs ([id number]) on 06/09/2018 5:24:15 PM   Radiology Dg Chest Port 1 View  Result Date: 06/09/2018 CLINICAL DATA:  Found unconscious. Hypoglycemic. Altered mental status. EXAM: PORTABLE CHEST 1 VIEW COMPARISON:  Two-view chest x-ray 05/26/2018 FINDINGS: Heart size normal. Asymmetric right lower lobe airspace disease is present. There is minimal atelectasis at the left base. A lateral right pleural effusion is noted. IMPRESSION: 1. Asymmetric right lower lobe airspace disease and effusion consistent with lobar pneumonia. Aspiration is considered less likely. 2. Minimal airspace disease the left base likely reflects atelectasis. Electronically Signed   By: San Morelle M.D.   On: 06/09/2018 17:14    Procedures Procedures (including critical care time)  CRITICAL CARE Performed by: Wandra Arthurs   Total critical care time: 30 minutes  Critical care time was exclusive of separately billable procedures and  treating other patients.  Critical care was necessary to treat or prevent imminent or life-threatening deterioration.  Critical care was time spent personally by me on the following activities: development of treatment plan with patient and/or surrogate as well as nursing, discussions with consultants, evaluation of patient's response to treatment, examination of patient, obtaining history from patient or surrogate, ordering and performing treatments and interventions, ordering and review of laboratory studies, ordering and review of radiographic studies, pulse oximetry and re-evaluation of patient's condition. '  Medications Ordered in ED Medications  dextrose 50 % solution (has no administration in time range)  cefTRIAXone (ROCEPHIN) 1 g in sodium chloride 0.9 % 100 mL IVPB (has no administration in time range)  azithromycin (ZITHROMAX) 500 mg in sodium chloride 0.9 % 250 mL IVPB (has no administration in time range)  dextrose 10 % infusion ( Intravenous New Bag/Given 06/09/18 1825)  glucagon (human recombinant) (GLUCAGEN) 1 MG injection (1 mg  Given 06/09/18 1653)  sodium chloride 0.9 % bolus 1,000 mL (0 mLs Intravenous Stopped 06/09/18 1800)     Initial Impression / Assessment and Plan / ED Course  I have reviewed the triage vital signs and the nursing notes.  Pertinent labs & imaging results that were available during my care of the patient were reviewed by me and considered in my medical decision making (see chart for details).    Russell Williamson is a 62 y.o. male here with hypoglycemia. Patient has poor appetite at home and has hx of pancreatic cancer. CT a week ago showed no obvious mass but possible atelectasis vs pneumonia. He was not sent home with abx. I think hypoglycemia can be from sepsis from pneumonia vs dehydration. Will get labs, CXR. He is still hypoglycemia at 68 despite 3 D10 boluses. Will give glucagon, D50 bolus.  Will likely need D10 drip.   7:43 PM CBG stable around  150s on D10 drip. Patient has pneumonia on CXR. WBC slightly low. Lactate normal. Given IV abx. Since he is on D10 drip, will admit for hypoglycemia likely from pneumonia. I attempted to reach family multiple times but nobody picked up. Patient is full code as per EMS. Will admit.   Final Clinical Impressions(s) / ED Diagnoses   Final diagnoses:  None    ED Discharge Orders    None       Drenda Freeze, MD 06/09/18 1944

## 2018-06-09 NOTE — ED Notes (Signed)
Bedside report given to 4e room 20 RN.

## 2018-06-09 NOTE — ED Triage Notes (Signed)
Per EMS:  Patient presents to ED for assessment after being found by family unresponsive.  CBG 11 at arrival, given 3 amps of D10 en route, CBG 64 at EMS arrival.  Patient more responsive.  500 Nacl running on arrival.

## 2018-06-09 NOTE — ED Notes (Signed)
Bare hugger placed on patient d/t hypothermia. Pt A & O. Per Pharmacy ok to run Rocephin with D10, switch to D5 for Zithromax infusion.

## 2018-06-09 NOTE — ED Notes (Addendum)
Pausing D10 infusion for D5W @125mL /hr for compatibility with abx per pharmacy recommendation. cbg 200 at this time.

## 2018-06-09 NOTE — H&P (Signed)
History and Physical    Russell Williamson KGM:[id number] DOB: 1956/06/28 DOA: 06/09/2018  Referring MD/NP/PA:   PCP: Harvie Junior, MD   Patient coming from:  The patient is coming from home.  At baseline, pt is independent for most of ADL.  Chief Complaint: AMS, cough, SOB and abdominal pain  HPI: Russell Williamson is a 62 y.o. male with medical history significant of hypertension, COPD, GERD, depression, anemia, chronic pancreatitis, tonsillar cancer (radiation therapy in remission), tobacco abuse, cocaine abuse, GERD, depression, who presents with altered mental status, cough, shortness of breath and abdominal pain.  Per report, pt was noted to altered by family and possibly was unresponsiveness for while. EMS was called. He was found to have hypoglycemia with CBG 11.Given 3 amps of D10 and CBG only came up to 64.  Patient was also noted to be hypotensive, and was given 500 cc normal saline bolus as well. 1 mg of glucagon was given in ED. Started D10 gtt. His mental status has gradually improved in ED.  When I saw patient in ED, he is alert and oriented x3.  He answers all questions appropriately.  He has hypothermia with body temperature 94.2. He states that he has abdominal pain, which is located in the central abdomen, constant, moderate, nonradiating.  He has nausea, no vomiting or diarrhea.  He denies any chest pain.  He has a cough with greenish colored mucus production and mild shortness of breath.  He denies fever or chills.  He moves all extremities normally. Of note, patient was seen in the ED about a week ago and showed possible pneumonia versus atelectasis.  Over the last week, patient has been having persistent poor appetite and decreased oral intake.  Patient states that he feels lightheaded and dizzy today.   ED Course: pt was found to have WBC 2.4, lactic acid of 1.41, negative troponin, abnormal liver function (AST 498, ALT 158, total bilirubin 0.9, ALP 221), negative  urinalysis, electrolytes renal function okay, hypothermia, no tachycardia, no tachypnea, oxygen saturation 100% on room air.  Chest x-ray showed asymmetric right lower lobe infiltration.  Patient is admitted to stepdown as inpatient.  Review of Systems:   General: no fevers, chills, no body weight gain, has poor appetite, has fatigue HEENT: no blurry vision, hearing changes or sore throat Respiratory: has dyspnea, coughing, no wheezing CV: no chest pain, no palpitations GI: has nausea, no vomiting, has abdominal pain, no diarrhea, constipation GU: no dysuria, burning on urination, increased urinary frequency, hematuria  Ext: no leg edema Neuro: no unilateral weakness, numbness, or tingling, no vision change or hearing loss. Has AMS, dizziness. Skin: no rash, no skin tear. MSK: No muscle spasm, no deformity, no limitation of range of movement in spin Heme: No easy bruising.  Travel history: No recent long distant travel.  Allergy: No Known Allergies  Past Medical History:  Diagnosis Date  . Anemia   . Arthritis   . Cervical adenopathy 04/26/2012  . Chronic back pain    lower  . Chronic pancreatitis ([place])   . Constipation   . COPD (chronic obstructive pulmonary disease) ([place])   . Daily headache 05/23/2012   "small ones"  . Deficiency anemia 04/15/2016  . Dyspnea    with exertion  . G tube feedings (HCC)    hx of  feeding tube in stomach  . GERD (gastroesophageal reflux disease)   . History of blood transfusion ~ 2004   "from the pancreatitis"  . History of radiation therapy  02/02/2011-03/22/2011   head/neck,left tonsil  . Hx of radiation therapy 02/02/11 to 03/22/11   L tonsil  . Hypertension   . Insomnia   . 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 Pancytopenia ([place]) 02/28/2014  . Poor venous access 04/28/2016  . 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 Thrush 11/02/2013  . Tonsillar cancer ([place]) 12/15/2010   Left  . Urinary  hesitancy     Past Surgical History:  Procedure Laterality Date  . BILE DUCT STENT PLACEMENT     hx of  . CHOLECYSTECTOMY  04/2005  . DIRECT LARYNGOSCOPY  05/23/2012   Procedure: DIRECT LARYNGOSCOPY;  Surgeon: Rozetta Nunnery, MD;  Location: [facility];  Service: ENT;  Laterality: N/A;  . DIRECT LARYNGOSCOPY N/A 03/23/2013   Procedure: DIRECT LARYNGOSCOPY;  Surgeon: Rozetta Nunnery, MD;  Location: [facility];  Service: ENT;  Laterality: N/A;  . ESOPHAGEAL DILATION N/A 03/23/2013   Procedure: ESOPHAGEAL DILATION;  Surgeon: Rozetta Nunnery, MD;  Location: [facility];  Service: ENT;  Laterality: N/A;  . ESOPHAGOGASTRODUODENOSCOPY (EGD) WITH PROPOFOL N/A 03/01/2017   Procedure: ESOPHAGOGASTRODUODENOSCOPY (EGD) WITH PROPOFOL;  Surgeon: Ladene Artist, MD;  [facility];  Service: Endoscopy;  Laterality: N/A;  . FLEXIBLE SIGMOIDOSCOPY N/A 03/01/2017   Procedure: FLEXIBLE SIGMOIDOSCOPY;  Surgeon: Ladene Artist, MD;  [facility];  Service: Endoscopy;  Laterality: N/A;  . IR GENERIC HISTORICAL  04/29/2016   IR US GUIDE VASC ACCESS RIGHT 04/29/2016 [facility]  . IR GENERIC HISTORICAL  04/29/2016   IR FLUORO GUIDE CV LINE RIGHT 04/29/2016 [facility]  . IR GENERIC HISTORICAL  05/12/2016   IR US GUIDE VASC ACCESS LEFT 05/12/2016 [facility]  . IR GENERIC HISTORICAL  05/12/2016   IR FLUORO GUIDE CV LINE LEFT 05/12/2016 [facility]  . IR GENERIC HISTORICAL  06/28/2016   IR US GUIDE VASC ACCESS RIGHT 06/28/2016 Sandi Mariscal, MD [facility]  . IR GENERIC HISTORICAL  06/28/2016   IR FLUORO GUIDE PORT INSERTION RIGHT 06/28/2016 Sandi Mariscal, MD [facility]  . IR REMOVAL TUN ACCESS W/ PORT W/O FL MOD SED  02/10/2017  . MASS BIOPSY  05/23/2012   Procedure: NECK MASS BIOPSY;  Surgeon: Rozetta Nunnery, MD;  Location: [facility];  Service: ENT;  Laterality: N/A;  . RADICAL NECK DISSECTION  05/23/2012   w/mass excision  . RADICAL NECK DISSECTION  05/23/2012    Procedure: RADICAL NECK DISSECTION;  Surgeon: Rozetta Nunnery, MD;  Location: [facility];  Service: ENT;  Laterality: Left;  . TIBIA FRACTURE SURGERY  1990's   left    Social History:  reports that he has been smoking cigarettes. He has a 10.00 pack-year smoking history. He has never used smokeless tobacco. He reports that he does not drink alcohol or use drugs.  Family History:  Family History  Problem Relation Age of Onset  . COPD Mother   . Colon cancer Neg Hx      Prior to Admission medications   Medication Sig Start Date End Date Taking? Authorizing Provider  albuterol (PROAIR HFA) 108 (90 Base) MCG/ACT inhaler Inhale 2 puffs into the lungs every 4 (four) hours as needed for wheezing or shortness of breath.   Yes [provider]  amLODipine (NORVASC) 2.5 MG tablet Take 2.5 mg by mouth daily. 12/24/16  Yes [provider]  feeding supplement, ENSURE ENLIVE, (ENSURE ENLIVE) LIQD Take 237 mLs  by mouth 3 (three) times daily between meals. Patient taking differently: Take 474 mLs by mouth 3 (three) times daily between meals. 2 cans 02/10/18  Yes Elodia Florence., MD  folic acid (FOLVITE) 1 MG tablet Take 1 mg by mouth daily.  05/14/15  Yes [provider]  lipase/protease/amylase (CREON-12/PANCREASE) 12000 UNITS CPEP capsule Take 12,000 Units by mouth 4 (four) times daily.    Yes [provider]  mirtazapine (REMERON) 30 MG tablet Take 30 mg by mouth at bedtime.   Yes [provider]  oxyCODONE (ROXICODONE) 15 MG immediate release tablet Take 1 tablet (15 mg total) by mouth every 8 (eight) hours as needed for pain. Patient taking differently: Take 15 mg by mouth every 4 (four) hours as needed for pain.  02/17/18  Yes Domenic Polite, MD  OXYGEN Inhale 3 L into the lungs as needed (shortness of breath).   Yes [provider]  promethazine (PHENERGAN) 25 MG tablet Take 1 tablet (25 mg total) by mouth every 6 (six) hours as needed for  nausea or vomiting. 05/27/18  Yes Law, Alexandra M, PA-C  temazepam (RESTORIL) 30 MG capsule Take 30 mg by mouth at bedtime.  01/27/18  Yes [provider]  amoxicillin-clavulanate (AUGMENTIN) 875-125 MG tablet Take 1 tablet by mouth every 12 (twelve) hours. 05/27/18   Law, Bea Graff, PA-C  Maltodextrin-Xanthan Gum (RESOURCE THICKENUP CLEAR) POWD Add to liquids to make nectar thick consistency for dysphagia Patient not taking: Reported on 04/23/2018 02/10/18   Elodia Florence., MD  pantoprazole (PROTONIX) 20 MG tablet Take 1 tablet (20 mg total) by mouth 2 (two) times daily. 05/27/18   Law, Bea Graff, PA-C  sucralfate (CARAFATE) 1 GM/10ML suspension Take 10 mLs (1 g total) by mouth 4 (four) times daily -  with meals and at bedtime. 05/27/18   Frederica Kuster, PA-C    Physical Exam: Vitals:   06/09/18 2104 06/09/18 2200 06/09/18 2245 06/10/18 0327  BP: 111/77 (!) 153/88 105/63 (!) 85/63  Pulse: 63   75  Resp: 12 15 15 13   Temp:   (!) 94.8 F (34.9 C) 98.2 F (36.8 C)  TempSrc:   Oral Oral  SpO2: 100%  98% 94%  Weight:   39.9 kg   Height:   5\' 5"  (1.651 m)    General: Not in acute distress.  Cachectic looking HEENT:       Eyes: PERRL, EOMI, no scleral icterus.       ENT: No discharge from the ears and nose, no pharynx injection, no tonsillar enlargement.        Neck: No JVD, no bruit, no mass felt. Heme: No neck lymph node enlargement. Cardiac: S1/S2, RRR, No murmurs, No gallops or rubs. Respiratory: No rales, wheezing, rhonchi or rubs. GI: Soft, nondistended, has tenderness diffusely, no rebound pain, no organomegaly, BS present. GU: No hematuria Ext: No pitting leg edema bilaterally. 2+DP/PT pulse bilaterally. Musculoskeletal: No joint deformities, No joint redness or warmth, no limitation of ROM in spin. Skin: No rashes.  Neuro: currently, Alert, oriented X3, cranial nerves II-XII grossly intact, moves all extremities normally.  Psych: Patient is not psychotic, no  suicidal or hemocidal ideation.  Labs on Admission: I have personally reviewed following labs and imaging studies  CBC: Recent Labs  Lab 06/09/18 1846 06/09/18 1855  WBC 2.4*  --   NEUTROABS 2.1  --   HGB 9.3* 10.2*  HCT 28.6* 30.0*  MCV 103.6*  --   PLT  137*  --    Basic Metabolic Panel: Recent Labs  Lab 06/09/18 1846 06/09/18 1855  NA 133* 132*  K 3.7 3.9  CL 97* 94*  CO2 28  --   GLUCOSE 238* 234*  BUN 34* 43*  CREATININE 0.65 0.70  CALCIUM 7.6*  --    GFR: Estimated Creatinine Clearance: 54 mL/min (by C-G formula based on SCr of 0.7 mg/dL). Liver Function Tests: Recent Labs  Lab 06/09/18 1846  AST 498*  ALT 158*  ALKPHOS 221*  BILITOT 0.9  PROT 5.6*  ALBUMIN 2.3*   Recent Labs  Lab 06/09/18 1846  LIPASE 20   No results for input(s): AMMONIA in the last 168 hours. Coagulation Profile: No results for input(s): INR, PROTIME in the last 168 hours. Cardiac Enzymes: No results for input(s): CKTOTAL, CKMB, CKMBINDEX, TROPONINI in the last 168 hours. BNP (last 3 results) No results for input(s): PROBNP in the last 8760 hours. HbA1C: No results for input(s): HGBA1C in the last 72 hours. CBG: Recent Labs  Lab 06/10/18 0013 06/10/18 0118 06/10/18 0227 06/10/18 0326 06/10/18 0534  GLUCAP 179* 165* 223* 252* 160*   Lipid Profile: No results for input(s): CHOL, HDL, LDLCALC, TRIG, CHOLHDL, LDLDIRECT in the last 72 hours. Thyroid Function Tests: No results for input(s): TSH, T4TOTAL, FREET4, T3FREE, THYROIDAB in the last 72 hours. Anemia Panel: No results for input(s): VITAMINB12, FOLATE, FERRITIN, TIBC, IRON, RETICCTPCT in the last 72 hours. Urine analysis:    Component Value Date/Time   COLORURINE YELLOW 06/09/2018 [place] 06/09/2018 1747   LABSPEC 1.015 06/09/2018 1747   PHURINE 5.0 06/09/2018 1747   GLUCOSEU >=500 (A) 06/09/2018 1747   HGBUR NEGATIVE 06/09/2018 1747   BILIRUBINUR NEGATIVE 06/09/2018 1747   KETONESUR  NEGATIVE 06/09/2018 1747   PROTEINUR NEGATIVE 06/09/2018 1747   UROBILINOGEN 0.2 08/31/2014 2350   NITRITE NEGATIVE 06/09/2018 1747   LEUKOCYTESUR NEGATIVE 06/09/2018 1747   Sepsis Labs: @LABRCNTIP (procalcitonin:4,lacticidven:4) ) Recent Results (from the past 240 hour(s))  Respiratory Panel by PCR     Status: None   Collection Time: 06/10/18 12:15 AM  Result Value Ref Range Status   Adenovirus NOT DETECTED NOT DETECTED Final   Coronavirus 229E NOT DETECTED NOT DETECTED Final   Coronavirus HKU1 NOT DETECTED NOT DETECTED Final   Coronavirus NL63 NOT DETECTED NOT DETECTED Final   Coronavirus OC43 NOT DETECTED NOT DETECTED Final   Metapneumovirus NOT DETECTED NOT DETECTED Final   Rhinovirus / Enterovirus NOT DETECTED NOT DETECTED Final   Influenza A NOT DETECTED NOT DETECTED Final   Influenza B NOT DETECTED NOT DETECTED Final   Parainfluenza Virus 1 NOT DETECTED NOT DETECTED Final   Parainfluenza Virus 2 NOT DETECTED NOT DETECTED Final   Parainfluenza Virus 3 NOT DETECTED NOT DETECTED Final   Parainfluenza Virus 4 NOT DETECTED NOT DETECTED Final   Respiratory Syncytial Virus NOT DETECTED NOT DETECTED Final   Bordetella pertussis NOT DETECTED NOT DETECTED Final   Chlamydophila pneumoniae NOT DETECTED NOT DETECTED Final   Mycoplasma pneumoniae NOT DETECTED NOT DETECTED Final    Comment: Performed at [facility], [place] [address], [place], [place] [address]     Radiological Exams on Admission: Dg Chest Port 1 View  Result Date: 06/09/2018 CLINICAL DATA:  Found unconscious. Hypoglycemic. Altered mental status. EXAM: PORTABLE CHEST 1 VIEW COMPARISON:  Two-view chest x-ray 05/26/2018 FINDINGS: Heart size normal. Asymmetric right lower lobe airspace disease is present. There is minimal atelectasis at the left base. A lateral  right pleural effusion is noted. IMPRESSION: 1. Asymmetric right lower lobe airspace disease and effusion consistent with lobar pneumonia. Aspiration is considered  less likely. 2. Minimal airspace disease the left base likely reflects atelectasis. Electronically Signed   By: San Morelle M.D.   On: 06/09/2018 17:14     EKG: Independently reviewed.  Sinus rhythm, bradycardia, QTC 473, low voltage, anteroseptal infarction pattern.  Assessment/Plan Principal Problem:   Hypoglycemia Active Problems:   HTN (hypertension)   Pancytopenia (HCC)   Chronic alcoholic pancreatitis (HCC)   Protein-calorie malnutrition, severe   COPD with chronic bronchitis (HCC)   Tobacco abuse   Sepsis ([place])   Acute metabolic encephalopathy   Aspiration pneumonia (HCC)   Lobar pneumonia (HCC)   Abnormal LFTs   Abdominal pain   Hypoglycemia: Most likely due to poor oral intake.  Since patient also has hypothermia, hypotension, cannot completely rule out sepsis and adrenal insufficiency. D10 was started initially which is switched to D5-NS since his CBG improved to ~200.  -will admit to SDU as inpt -continue D5-NS at 100 cc/h -f/u CBG q2-4 hours -check cortisol level and give 100 mg of solucortef -check insulin level, C-peptide level, sulfonylurea panel  Acute metabolic encephalopathy: Most likely due to hypoglycemia.  Mental status improved significantly along with the correction of hypoglycemia.  Currently patient is oriented x3. -Frequent neuro check  HTN (hypertension): Patient initially had blood pressure 110/78, which dropped to 85/63 later on. -hold Bp med, amlodipine -IV hydralazine as needed  Pancytopenia ([place]): WBC 2.4, platelets 137, hemoglobin 10.2.  No active bleeding.  Likely due to poor nutrition. -Follow-up with CBC  Protein-calorie malnutrition, severe: -Nutrition supplement -Consult to nutrition  Chronic alcoholic pancreatitis and abdominal pain: lipase 20.  His abdominal pain is most likely due to chronic alcoholic pancreatitis.  Patient states that he stopped drinking alcohol. -Continue Creon -continue home PRN oxycodone  Possible  apiration pneumonia vs. lobar pneumonia and sepsis: Patient has a productive cough and mild shortness of breath.  Chest x-ray showed asymmetric right lower lobe infiltration, indicating possible aspiration pneumonia.  Lobar pneumonia is also possible.  Patient meets criteria for sepsis with WBC 2.4, hypothermia, hypotension.  Lactic acid is normal.  - IV Rocephin and azithromycin, Flagyl - Mucinex for cough  - prn Albuterol Neb for SOB - Urine legionella and S. pneumococcal antigen - Follow up blood culture x2, sputum culture and respiratory virus panel - will get Procalcitonin and trend lactic acid level per sepsis protocol - IVF: 2L of NS bolus in ED, followed by D5-NS at 100 cc/h  COPD with chronic bronchitis (HCC): No acute exacerbation -PRN albuterol nebulizers  Tobacco abuse -Nicotine pathc  Abnormal LFTs: -check hepatitis panel nd HIV abx     Inpatient status:  # Patient requires inpatient status due to high intensity of service, high risk for further deterioration and high frequency of surveillance required.  I certify that at the point of admission it is my clinical judgment that the patient will require inpatient hospital care spanning beyond 2 midnights from the point of admission.  . This patient has multiple chronic comorbidities including hypertension, COPD, GERD, depression, anemia, chronic pancreatitis, tonsillar cancer (radiation therapy in remission), tobacco abuse, cocaine abuse, GERD, depression, who presents with altered mental status, cough, shortness of breath and abdominal pain. . Now patient has presenting symptoms include AMS, hypoglycemia, shortness of breath, cough, abdominal pain . The worrisome physical exam findings include abdominal tenderness, cachectic looking. . The initial radiographic and laboratory  data are worrisome because of asymmetric right lower lobe infiltration on chest x-ray, hypothermia, hypoglycemia . Current medical needs: please see my  assessment and plan        DVT ppx: SQ Lovenox Code Status: Full code Family Communication: None at bed side.   Disposition Plan:  Anticipate discharge back to previous home environment Consults called:  none Admission status: SDU/inpation       Date of Service 06/10/2018    Ivor Costa [facility] Pager [phone]  If 7PM-7AM, please contact night-coverage [email or web address] Password [facility] 06/10/2018, 6:21 AM

## 2018-06-10 ENCOUNTER — Other Ambulatory Visit: Payer: Self-pay

## 2018-06-10 DIAGNOSIS — R109 Unspecified abdominal pain: Secondary | ICD-10-CM | POA: Diagnosis present

## 2018-06-10 LAB — GLUCOSE, CAPILLARY
GLUCOSE-CAPILLARY: 252 mg/dL — AB (ref 70–99)
GLUCOSE-CAPILLARY: 160 mg/dL — AB (ref 70–99)
GLUCOSE-CAPILLARY: 144 mg/dL — AB (ref 70–99)
GLUCOSE-CAPILLARY: 128 mg/dL — AB (ref 70–99)
Glucose-Capillary: 165 mg/dL — ABNORMAL HIGH (ref 70–99)
Glucose-Capillary: 166 mg/dL — ABNORMAL HIGH (ref 70–99)
Glucose-Capillary: 179 mg/dL — ABNORMAL HIGH (ref 70–99)
Glucose-Capillary: 223 mg/dL — ABNORMAL HIGH (ref 70–99)
Glucose-Capillary: 99 mg/dL (ref 70–99)

## 2018-06-10 LAB — RESPIRATORY PANEL BY PCR
Adenovirus: NOT DETECTED
BORDETELLA PERTUSSIS-RVPCR: NOT DETECTED
CHLAMYDOPHILA PNEUMONIAE-RVPPCR: NOT DETECTED
CORONAVIRUS 229E-RVPPCR: NOT DETECTED
Coronavirus HKU1: NOT DETECTED
Coronavirus NL63: NOT DETECTED
Coronavirus OC43: NOT DETECTED
INFLUENZA B-RVPPCR: NOT DETECTED
Influenza A: NOT DETECTED
Metapneumovirus: NOT DETECTED
Mycoplasma pneumoniae: NOT DETECTED
Parainfluenza Virus 1: NOT DETECTED
Parainfluenza Virus 2: NOT DETECTED
Parainfluenza Virus 3: NOT DETECTED
Parainfluenza Virus 4: NOT DETECTED
RHINOVIRUS / ENTEROVIRUS - RVPPCR: NOT DETECTED
Respiratory Syncytial Virus: NOT DETECTED

## 2018-06-10 LAB — STREP PNEUMONIAE URINARY ANTIGEN: STREP PNEUMO URINARY ANTIGEN: NEGATIVE

## 2018-06-10 LAB — CBC
HCT: 25.7 % — ABNORMAL LOW (ref 39.0–52.0)
Hemoglobin: 8.8 g/dL — ABNORMAL LOW (ref 13.0–17.0)
MCH: 34.6 pg — ABNORMAL HIGH (ref 26.0–34.0)
MCHC: 34.2 g/dL (ref 30.0–36.0)
MCV: 101.2 fL — ABNORMAL HIGH (ref 78.0–100.0)
PLATELETS: 104 10*3/uL — AB (ref 150–400)
RBC: 2.54 MIL/uL — ABNORMAL LOW (ref 4.22–5.81)
RDW: 15.2 % (ref 11.5–15.5)
WBC: 3.3 10*3/uL — AB (ref 4.0–10.5)

## 2018-06-10 LAB — LACTIC ACID, PLASMA: LACTIC ACID, VENOUS: 1.4 mmol/L (ref 0.5–1.9)

## 2018-06-10 LAB — MAGNESIUM: Magnesium: 1.3 mg/dL — ABNORMAL LOW (ref 1.7–2.4)

## 2018-06-10 LAB — PROCALCITONIN: Procalcitonin: 42.01 ng/mL

## 2018-06-10 MED ORDER — ADULT MULTIVITAMIN W/MINERALS CH
1.0000 | ORAL_TABLET | Freq: Every day | ORAL | Status: DC
  Administered 2018-06-10 – 2018-06-20 (×11): 1 via ORAL
  Filled 2018-06-10 (×11): qty 1

## 2018-06-10 MED ORDER — SODIUM CHLORIDE 0.9 % IV BOLUS
1000.0000 mL | Freq: Once | INTRAVENOUS | Status: AC
  Administered 2018-06-10: 1000 mL via INTRAVENOUS

## 2018-06-10 MED ORDER — MAGNESIUM SULFATE 2 GM/50ML IV SOLN
2.0000 g | Freq: Once | INTRAVENOUS | Status: AC
  Administered 2018-06-10: 2 g via INTRAVENOUS
  Filled 2018-06-10: qty 50

## 2018-06-10 MED ORDER — LACTATED RINGERS IV SOLN
INTRAVENOUS | Status: DC
  Administered 2018-06-10 – 2018-06-11 (×2): via INTRAVENOUS

## 2018-06-10 MED ORDER — PRO-STAT SUGAR FREE PO LIQD
30.0000 mL | Freq: Three times a day (TID) | ORAL | Status: AC
  Administered 2018-06-10 – 2018-06-18 (×23): 30 mL via ORAL
  Filled 2018-06-10 (×23): qty 30

## 2018-06-10 MED ORDER — MUPIROCIN 2 % EX OINT
1.0000 "application " | TOPICAL_OINTMENT | Freq: Two times a day (BID) | CUTANEOUS | Status: DC
  Administered 2018-06-10 – 2018-06-13 (×6): 1 via NASAL
  Filled 2018-06-10: qty 22

## 2018-06-10 MED ORDER — INFLUENZA VAC SPLIT QUAD 0.5 ML IM SUSY
0.5000 mL | PREFILLED_SYRINGE | INTRAMUSCULAR | Status: AC
  Administered 2018-06-12: 0.5 mL via INTRAMUSCULAR
  Filled 2018-06-10: qty 0.5

## 2018-06-10 MED ORDER — DEXTROSE-NACL 5-0.9 % IV SOLN
INTRAVENOUS | Status: DC
  Administered 2018-06-10: 06:00:00 via INTRAVENOUS

## 2018-06-10 MED ORDER — CHLORHEXIDINE GLUCONATE CLOTH 2 % EX PADS
6.0000 | MEDICATED_PAD | Freq: Every day | CUTANEOUS | Status: DC
  Administered 2018-06-11 – 2018-06-13 (×3): 6 via TOPICAL

## 2018-06-10 MED ORDER — RESOURCE THICKENUP CLEAR PO POWD
ORAL | Status: DC | PRN
  Filled 2018-06-10 (×3): qty 125

## 2018-06-10 NOTE — Progress Notes (Signed)
Initial Nutrition Assessment  DOCUMENTATION CODES:   Severe malnutrition in context of chronic illness, Underweight  INTERVENTION:    Ensure Enlive po QID, each supplement provides 350 kcal and 20 grams of protein  Magic cup TID with meals, each supplement provides 290 kcal and 9 grams of protein  Multivitamin daily  NUTRITION DIAGNOSIS:   Severe Malnutrition related to chronic illness(COPD) as evidenced by severe fat depletion, severe muscle depletion.  GOAL:   Patient will meet greater than or equal to 90% of their needs  MONITOR:   PO intake, Supplement acceptance, Labs, Skin  REASON FOR ASSESSMENT:   Malnutrition Screening Tool, Consult Assessment of nutrition requirement/status  ASSESSMENT:   62 yo male with PMH of HTN, COPD, GERD, anemia, chronic pancreatitis, tonsillar cancer, tobacco & cocaine abuse, who was admitted on 9/6 with AMS, SOB, hypoglycemia.  Patient reports poor appetite PTA and now. He thinks he has lost 10 lbs since last admission (unsure when). He drinks Ensure supplements at home, but is unable to eat much due to poor appetite.  Weight has been trending down steadily. Patient has lost 7% of usual weight within the past year.  Labs reviewed. Sodium 132 (L) CBG's: 252-160-144-128 Medications reviewed and include folic acid, solu-cortef, creon, remeron.   NUTRITION - FOCUSED PHYSICAL EXAM:    Most Recent Value  Orbital Region  Severe depletion  Upper Arm Region  Severe depletion  Thoracic and Lumbar Region  Severe depletion  Buccal Region  Severe depletion  Temple Region  Severe depletion  Clavicle Bone Region  Severe depletion  Clavicle and Acromion Bone Region  Severe depletion  Scapular Bone Region  Severe depletion  Dorsal Hand  Mild depletion  Patellar Region  Severe depletion  Anterior Thigh Region  Severe depletion  Posterior Calf Region  Severe depletion  Edema (RD Assessment)  None  Hair  Reviewed  Eyes  Reviewed  Mouth   Reviewed  Skin  Reviewed  Nails  Reviewed       Diet Order:   Diet Order            Diet Heart Room service appropriate? Yes; Fluid consistency: Thin  Diet effective now              EDUCATION NEEDS:   No education needs have been identified at this time  Skin:  Skin Assessment: Skin Integrity Issues: Skin Integrity Issues:: Stage II, DTI DTI: R & L heel Stage II: sacrum  Last BM:  unknown  Height:   Ht Readings from Last 1 Encounters:  06/09/18 5\' 5"  (1.651 m)    Weight:   Wt Readings from Last 1 Encounters:  06/09/18 39.9 kg    Ideal Body Weight:  61.8 kg  BMI:  Body mass index is 14.64 kg/m.  Estimated Nutritional Needs:   Kcal:  1500-1700  Protein:  60-80 gm  Fluid:  1.5 L    Molli Barrows, RD, LDN, [place] Pager [phone] After Hours Pager [phone]

## 2018-06-10 NOTE — Evaluation (Signed)
Clinical/Bedside Swallow Evaluation Patient Details  Name: Russell Williamson MRN: [id number] Date of Birth: 1956/05/01  Today's Date: 06/10/2018 Time: SLP Start Time (ACUTE ONLY): 42 SLP Stop Time (ACUTE ONLY): 1558 SLP Time Calculation (min) (ACUTE ONLY): 18 min  Past Medical History:  Past Medical History:  Diagnosis Date  . Anemia   . Arthritis   . Cervical adenopathy 04/26/2012  . Chronic back pain    lower  . Chronic pancreatitis ([place])   . Constipation   . COPD (chronic obstructive pulmonary disease) ([place])   . Daily headache 05/23/2012   "small ones"  . Deficiency anemia 04/15/2016  . Dyspnea    with exertion  . G tube feedings ([facility])    hx of  feeding tube in stomach  . GERD (gastroesophageal reflux disease)   . History of blood transfusion ~ 2004   "from the pancreatitis"  . History of radiation therapy 02/02/2011-03/22/2011   head/neck,left tonsil  . Hx of radiation therapy 02/02/11 to 03/22/11   L tonsil  . Hypertension   . Insomnia   . 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 Pancytopenia ([place]) 02/28/2014  . Poor venous access 04/28/2016  . 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 Thrush 11/02/2013  . 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 Urinary hesitancy    Past Surgical History:  Past Surgical History:  Procedure Laterality Date  . BILE DUCT STENT PLACEMENT     hx of  . CHOLECYSTECTOMY  04/2005  . DIRECT LARYNGOSCOPY  05/23/2012   Procedure: DIRECT LARYNGOSCOPY;  Surgeon: Rozetta Nunnery, MD;  Location: [facility];  Service: ENT;  Laterality: N/A;  . DIRECT LARYNGOSCOPY N/A 03/23/2013   Procedure: DIRECT LARYNGOSCOPY;  Surgeon: Rozetta Nunnery, MD;  Location: [facility];  Service: ENT;  Laterality: N/A;  . ESOPHAGEAL DILATION N/A 03/23/2013   Procedure: ESOPHAGEAL DILATION;  Surgeon: Rozetta Nunnery, MD;  Location: [facility];  Service: ENT;  Laterality: N/A;  .  ESOPHAGOGASTRODUODENOSCOPY (EGD) WITH PROPOFOL N/A 03/01/2017   Procedure: ESOPHAGOGASTRODUODENOSCOPY (EGD) WITH PROPOFOL;  Surgeon: Ladene Artist, MD;  Location: [facility];  Service: Endoscopy;  Laterality: N/A;  . FLEXIBLE SIGMOIDOSCOPY N/A 03/01/2017   Procedure: FLEXIBLE SIGMOIDOSCOPY;  Surgeon: Ladene Artist, MD;  Location: [facility];  Service: Endoscopy;  Laterality: N/A;  . IR GENERIC HISTORICAL  04/29/2016   IR US GUIDE VASC ACCESS RIGHT 04/29/2016 [facility]  . IR GENERIC HISTORICAL  04/29/2016   IR FLUORO GUIDE CV LINE RIGHT 04/29/2016 [facility]  . IR GENERIC HISTORICAL  05/12/2016   IR US GUIDE VASC ACCESS LEFT 05/12/2016 [facility]  . IR GENERIC HISTORICAL  05/12/2016   IR FLUORO GUIDE CV LINE LEFT 05/12/2016 [facility]  . IR GENERIC HISTORICAL  06/28/2016   IR US GUIDE VASC ACCESS RIGHT 06/28/2016 Sandi Mariscal, MD [facility]  . IR GENERIC HISTORICAL  06/28/2016   IR FLUORO GUIDE PORT INSERTION RIGHT 06/28/2016 Sandi Mariscal, MD [facility]  . IR REMOVAL TUN ACCESS W/ PORT W/O FL MOD SED  02/10/2017  . MASS BIOPSY  05/23/2012   Procedure: NECK MASS BIOPSY;  Surgeon: Rozetta Nunnery, MD;  Location: [facility];  Service: ENT;  Laterality: N/A;  . RADICAL NECK DISSECTION  05/23/2012   w/mass excision  . RADICAL NECK DISSECTION  05/23/2012   Procedure: RADICAL NECK DISSECTION;  Surgeon: Rozetta Nunnery, MD;  Location: [facility];  Service: ENT;  Laterality: Left;  . TIBIA FRACTURE SURGERY  1990's   left   HPI:  [name] a 62 y.o.malewith medical history significant ofhypertension, COPD, GERD, depression, anemia, chronic pancreatitis, tonsillar cancer (radiation therapy in remission), tobacco abuse, cocaine abuse, GERD, depression admitted with sepsis with right lower lobe pneumonia, hypothermia and hypoglycemia. Known to ST from prior admissions, with severe, chronic pharyngoesophageal dysphagia. Per MBS 02/15/18, dys 1, honey by teaspoon was recommended.     Assessment / Plan / Recommendation Clinical Impression  Patient presents with chronic dysphagia and severe risk for aspiration, dehydration and malnutrition. Per MBS in May, dys 1, honey thick liquids by teaspoon and compensatory strategies was seen to reduce but not eliminate risk of aspiration. Pt has poor recall of these recommendations and has not been adhering to this diet. No family members are present. He presents today with delayed coughing and throat clearing, most significant with thin liquids, and multiple swallows, suggestive of decreased airway protection and pharyngeal residue. At time of last evaluation SLP recommended goals of care discussion given pt's poor prognosis with chronic nature of his dysphagia and ongoing aspiration risks. Per RN family have not seemed receptive to these discussions. For now, recommend dys 1, honey by teaspoon, with meds crushed. At bedside these textures appeared to decrease overt signs of aspiration. Pt should cough after swallow and swallow multiple times per bite/sip. Repeat instrumental assessment (MBS) may be warranted should this aid pt/family in decision-making, as thickened liquids may contribute to further dehydration and decrease quality of life.      SLP Visit Diagnosis: Dysphagia, pharyngoesophageal phase (R13.14)    Aspiration Risk  Severe aspiration risk;Risk for inadequate nutrition/hydration    Diet Recommendation Dysphagia 1 (Puree);Honey-thick liquid   Liquid Administration via: Spoon Medication Administration: Crushed with puree Supervision: Patient able to self feed;Intermittent supervision to cue for compensatory strategies Compensations: Slow rate;Small sips/bites;Multiple dry swallows after each bite/sip;Hard cough after swallow;Effortful swallow Postural Changes: Seated upright at 90 degrees;Remain upright for at least 30 minutes after po intake    Other  Recommendations Recommended Consults: Other (Comment)(palliative  consult/goals of care discussion if family agree) Oral Care Recommendations: Oral care before and after PO Other Recommendations: Order thickener from pharmacy;Prohibited food (jello, ice cream, thin soups);Remove water pitcher   Follow up Recommendations Other (comment)(tbd)      Frequency and Duration min 2x/week  1 week       Prognosis Prognosis for Safe Diet Advancement: Guarded Barriers to Reach Goals: Time post onset;Severity of deficits      Swallow Study   General Date of Onset: 06/09/18 HPI: Terral Cooks a 62 y.o.malewith medical history significant ofhypertension, COPD, GERD, depression, anemia, chronic pancreatitis, tonsillar cancer (radiation therapy in remission), tobacco abuse, cocaine abuse, GERD, depression admitted with sepsis with right lower lobe pneumonia, hypothermia and hypoglycemia. Known to ST from prior admissions, with severe, chronic pharyngoesophageal dysphagia. Per MBS 02/15/18, dys 1, honey by teaspoon was recommended.  Type of Study: Bedside Swallow Evaluation Previous Swallow Assessment: see HPI Diet Prior to this Study: Dysphagia 1 (puree);Honey-thick liquids Temperature Spikes Noted: No Respiratory Status: Nasal cannula History of Recent Intubation: No Behavior/Cognition: Alert;Cooperative Oral Cavity Assessment: Within Functional Limits Oral Care Completed by SLP: Yes Oral Cavity - Dentition: Edentulous Vision: Functional for self-feeding Self-Feeding Abilities: Able to feed self Patient Positioning: Upright in bed Baseline Vocal Quality: Hoarse;Low vocal intensity Volitional Cough: Weak;Congested Volitional Swallow: Able to elicit    Oral/Motor/Sensory Function Overall Oral Motor/Sensory Function: Within  functional limits   Ice Chips Ice chips: Impaired Pharyngeal Phase Impairments: Throat Clearing - Delayed;Cough - Delayed   Thin Liquid Thin Liquid: Impaired Presentation: Cup Pharyngeal  Phase Impairments: Cough -  Delayed;Multiple swallows    Nectar Thick Nectar Thick Liquid: Impaired Presentation: Cup Pharyngeal Phase Impairments: Cough - Delayed;Multiple swallows   Honey Thick Honey Thick Liquid: Impaired Pharyngeal Phase Impairments: Throat Clearing - Delayed;Multiple swallows   Puree Puree: Impaired Presentation: Spoon Pharyngeal Phase Impairments: Throat Clearing - Delayed;Multiple swallows   Solid     Solid: Not tested     Deneise Lever, MS, [place] Pager: [phone] Office: [phone]  Aliene Altes 06/10/2018,4:22 PM

## 2018-06-10 NOTE — Plan of Care (Signed)
  Problem: Education: Goal: Knowledge of General Education information will improve Description Including pain rating scale, medication(s)/side effects and non-pharmacologic comfort measures Outcome: Progressing   

## 2018-06-10 NOTE — Progress Notes (Signed)
@IPLOG @        PROGRESS NOTE                                                                                                                                                                                                             Patient Demographics:    Russell Williamson, is a 62 y.o. male, DOB - 04/12/1956, QIW:[id number]  Admit date - 06/09/2018   Admitting Physician Ivor Costa, MD  Outpatient Primary MD for the patient is Harvie Junior, MD  LOS - 1  Chief Complaint  Patient presents with  . Hypoglycemia       Brief Narrative  Russell Williamson is a 62 y.o. male with medical history significant of hypertension, COPD, GERD, depression, anemia, chronic pancreatitis, tonsillar cancer (radiation therapy in remission), tobacco abuse, cocaine abuse, GERD, depression, who presents with altered mental status, cough, shortness of breath and abdominal pain.   Subjective:    Russell Williamson today has, No headache, No chest pain, he has chronic epigastric abdominal pain - No Nausea, No new weakness tingling or numbness, is have a productive cough but no shortness of breath.   Assessment  & Plan :     1.  Sepsis with right lower lobe pneumonia, hypothermia and hypoglycemia.  Continue treatment with IV fluids, empiric IV antibiotics, bear hugger, monitor cultures, will have speech evaluate as well, for now continue supportive care.  Sepsis physiology seems to have resolved.  We will continue to monitor.  2.  Chronic pancreatitis with severe cachexia, severe protein calorie malnutrition.  Continue pancreatic enzyme supplementation and protein supplementation.  Outpatient GI follow-up.  3.  Chronic abdominal pain.  Due to #2 above.  Supportive care.  4.  GERD.  On PPI and Carafate continue.  5.  Ongoing smoking.  Counseled to quit.  NicoDerm patch.  6.  Hypomagnesemia.  Replaced will monitor.  7.  COPD.  Stable no acute issues no wheezing.  8.  Possible underlying dysphagia.  For  now soft diet with nectar thick liquids, feeding assistance, speech eval.    Family Communication  :  None  Code Status :  Full  Disposition Plan  :  Stepdown  Consults  : None  Procedures  :     DVT Prophylaxis  :  Lovenox   Lab Results  Component Value Date   PLT 104 (L) 06/10/2018    Diet :  Diet Order            Diet Heart Room service appropriate? Yes; Fluid  consistency: Thin  Diet effective now               Inpatient Medications Scheduled Meds: . enoxaparin (LOVENOX) injection  40 mg Subcutaneous Q24H  . feeding supplement (ENSURE ENLIVE)  474 mL Oral TID BM  . folic acid  1 mg Oral Daily  . hydrocortisone sod succinate (SOLU-CORTEF) inj  100 mg Intravenous Once  . lipase/protease/amylase  12,000 Units Oral TID WC & HS  . metroNIDAZOLE  250 mg Oral Q8H  . mirtazapine  30 mg Oral QHS  . multivitamin with minerals  1 tablet Oral Daily  . nicotine  21 mg Transdermal Daily  . pantoprazole  20 mg Oral BID  . sucralfate  1 g Oral TID WC & HS   Continuous Infusions: . azithromycin    . cefTRIAXone (ROCEPHIN)  IV    . dextrose 5 % and 0.9% NaCl 100 mL/hr at 06/10/18 0601  . magnesium sulfate 1 - 4 g bolus IVPB     PRN Meds:.albuterol, dextromethorphan-guaiFENesin, dextrose, hydrALAZINE, iopamidol, ondansetron (ZOFRAN) IV, oxyCODONE, zolpidem  Antibiotics  :   Anti-infectives (From admission, onward)   Start     Dose/Rate Route Frequency Ordered Stop   06/10/18 2000  cefTRIAXone (ROCEPHIN) 1 g in sodium chloride 0.9 % 100 mL IVPB     1 g 200 mL/hr over 30 Minutes Intravenous Every 24 hours 06/09/18 2134 06/16/18 1959   06/10/18 2000  azithromycin (ZITHROMAX) 500 mg in sodium chloride 0.9 % 250 mL IVPB     500 mg 250 mL/hr over 60 Minutes Intravenous Every 24 hours 06/09/18 2134 06/14/18 1959   06/09/18 2200  metroNIDAZOLE (FLAGYL) tablet 250 mg     250 mg Oral Every 8 hours 06/09/18 2135     06/09/18 1730  cefTRIAXone (ROCEPHIN) 1 g in sodium chloride 0.9  % 100 mL IVPB     1 g 200 mL/hr over 30 Minutes Intravenous  Once 06/09/18 1720 06/09/18 2334   06/09/18 1730  azithromycin (ZITHROMAX) 500 mg in sodium chloride 0.9 % 250 mL IVPB     500 mg 250 mL/hr over 60 Minutes Intravenous  Once 06/09/18 1720 06/09/18 2311          Objective:   Vitals:   06/09/18 2200 06/09/18 2245 06/10/18 0327 06/10/18 0754  BP: (!) 153/88 105/63 (!) 85/63 94/62  Pulse:   75 76  Resp: 15 15 13 12   Temp:  (!) 94.8 F (34.9 C) 98.2 F (36.8 C) 99.2 F (37.3 C)  TempSrc:  Oral Oral Axillary  SpO2:  98% 94% 96%  Weight:  39.9 kg    Height:  5\' 5"  (1.651 m)      Wt Readings from Last 3 Encounters:  06/09/18 39.9 kg  04/23/18 36.3 kg  02/18/18 48.9 kg     Intake/Output Summary (Last 24 hours) at 06/10/2018 1235 Last data filed at 06/10/2018 1100 Gross per 24 hour  Intake 1120 ml  Output 700 ml  Net 420 ml     Physical Exam  Awake Alert, Oriented X 3, No new F.N deficits, Normal affect [place].AT,PERRAL Supple Neck,No JVD, No cervical lymphadenopathy appriciated.  Symmetrical Chest wall movement, Good air movement bilaterally, RLL rales RRR,No Gallops,Rubs or new Murmurs, No Parasternal Heave +ve B.Sounds, Abd Soft, No tenderness, No organomegaly appriciated, No rebound - guarding or rigidity. No Cyanosis, Clubbing or edema, No new Rash or bruise       Data Review:    CBC Recent Labs  Lab 06/09/18 1846 06/09/18 1855 06/10/18 0729  WBC 2.4*  --  3.3*  HGB 9.3* 10.2* 8.8*  HCT 28.6* 30.0* 25.7*  PLT 137*  --  104*  MCV 103.6*  --  101.2*  MCH 33.7  --  34.6*  MCHC 32.5  --  34.2  RDW 15.7*  --  15.2  LYMPHSABS 0.1*  --   --   MONOABS 0.2  --   --   EOSABS 0.0  --   --   BASOSABS 0.0  --   --     Chemistries  Recent Labs  Lab 06/09/18 1846 06/09/18 1855 06/10/18 0729  NA 133* 132*  --   K 3.7 3.9  --   CL 97* 94*  --   CO2 28  --   --   GLUCOSE 238* 234*  --   BUN 34* 43*  --   CREATININE 0.65 0.70  --   CALCIUM 7.6*  --    --   MG  --   --  1.3*  AST 498*  --   --   ALT 158*  --   --   ALKPHOS 221*  --   --   BILITOT 0.9  --   --    ------------------------------------------------------------------------------------------------------------------ No results for input(s): CHOL, HDL, LDLCALC, TRIG, CHOLHDL, LDLDIRECT in the last 72 hours.  Lab Results  Component Value Date   HGBA1C 6.2 (H) 02/11/2018   ------------------------------------------------------------------------------------------------------------------ No results for input(s): TSH, T4TOTAL, T3FREE, THYROIDAB in the last 72 hours.  Invalid input(s): FREET3 ------------------------------------------------------------------------------------------------------------------ No results for input(s): VITAMINB12, FOLATE, FERRITIN, TIBC, IRON, RETICCTPCT in the last 72 hours.  Coagulation profile No results for input(s): INR, PROTIME in the last 168 hours.  No results for input(s): DDIMER in the last 72 hours.  Cardiac Enzymes No results for input(s): CKMB, TROPONINI, MYOGLOBIN in the last 168 hours.  Invalid input(s): CK ------------------------------------------------------------------------------------------------------------------    Component Value Date/Time   BNP 495.7 (H) 02/14/2018 0353    Micro Results Recent Results (from the past 240 hour(s))  Blood culture (routine x 2)     Status: None (Preliminary result)   Collection Time: 06/09/18  5:20 PM  Result Value Ref Range Status   Specimen Description BLOOD BLOOD LEFT WRIST  Final   Special Requests   Final    BOTTLES DRAWN AEROBIC ONLY Blood Culture results may not be optimal due to an inadequate volume of blood received in culture bottles   Culture   Final    NO GROWTH < 24 HOURS Performed at [facility], [place] [address], [place], [place] [address]    Report Status PENDING  Incomplete  Blood culture (routine x 2)     Status: None (Preliminary result)   Collection Time:  06/09/18  5:25 PM  Result Value Ref Range Status   Specimen Description BLOOD RIGHT ANTECUBITAL  Final   Special Requests   Final    BOTTLES DRAWN AEROBIC AND ANAEROBIC Blood Culture adequate volume   Culture   Final    NO GROWTH < 24 HOURS Performed at [facility], [place] [address] [place], [place] [address]    Report Status PENDING  Incomplete  Respiratory Panel by PCR     Status: None   Collection Time: 06/10/18 12:15 AM  Result Value Ref Range Status   Adenovirus NOT DETECTED NOT DETECTED Final   Coronavirus 229E NOT DETECTED NOT DETECTED Final   Coronavirus HKU1 NOT DETECTED NOT DETECTED Final  Coronavirus NL63 NOT DETECTED NOT DETECTED Final   Coronavirus OC43 NOT DETECTED NOT DETECTED Final   Metapneumovirus NOT DETECTED NOT DETECTED Final   Rhinovirus / Enterovirus NOT DETECTED NOT DETECTED Final   Influenza A NOT DETECTED NOT DETECTED Final   Influenza B NOT DETECTED NOT DETECTED Final   Parainfluenza Virus 1 NOT DETECTED NOT DETECTED Final   Parainfluenza Virus 2 NOT DETECTED NOT DETECTED Final   Parainfluenza Virus 3 NOT DETECTED NOT DETECTED Final   Parainfluenza Virus 4 NOT DETECTED NOT DETECTED Final   Respiratory Syncytial Virus NOT DETECTED NOT DETECTED Final   Bordetella pertussis NOT DETECTED NOT DETECTED Final   Chlamydophila pneumoniae NOT DETECTED NOT DETECTED Final   Mycoplasma pneumoniae NOT DETECTED NOT DETECTED Final    Comment: Performed at [facility], [place] [address] [place], [place] [address]    Radiology Reports Ct Abdomen Pelvis Wo Contrast  Result Date: 05/26/2018 CLINICAL DATA:  Nausea and vomiting. Abdominal pain. Patient reports generalized body aches and weakness over the past week. Patient reports history of pancreatic and throat cancer. EXAM: CT ABDOMEN AND PELVIS WITHOUT CONTRAST TECHNIQUE: Multidetector CT imaging of the abdomen and pelvis was performed following the standard protocol without IV contrast. COMPARISON:  CT  02/07/2018 FINDINGS: Lower chest: Diminished pleural effusions from prior CT with mild right-greater-than-left residual. Associated bibasilar patchy and confluent airspace opacities which appear slightly nodular, also with mild improvement. Emphysema noted. Hepatobiliary: Lack of IV contrast and cachexia significantly limits detailed assessment. Particularly, limited assessment for focal hepatic lesion. Pneumobilia in the left lobe likely postprocedural. No evidence of focal lesion on noncontrast exam. Postcholecystectomy. Biliary tree not well visualized. Pancreas: Pancreas not well seen given cachexia and lack of IV contrast. There are pancreatic calcifications, which are better delineated on prior contrast-enhanced exam. No gross peripancreatic inflammation allowing limitations. Spleen: Normal in size but otherwise limited evaluation. Adrenals/Urinary Tract: Adrenal glands not visualized. Kidneys demonstrate diffuse cortical low-density with increased density in the renal pyramids, of unknown significance. Urinary bladder is physiologically distended. No evidence of wall thickening. Stomach/Bowel: Generalized cachexia paucity of intra-abdominal fat limits assessment. Suspected diffuse gastric wall thickening. No bowel dilatation to suggest obstruction. Formed stool in the distal colon. Proximal colon is not well assessed. Vascular/Lymphatic: Advanced aortic and branch atherosclerosis. Significantly limited evaluation for adenopathy. Air in the region of the left iliac vein, parent suggesting attempted catheter placement. Reproductive: Prostate gland grossly normal. Other: Progressive cachexia over the past 3 months. Suspected mild mesenteric and body wall edema. Musculoskeletal: Bony under mineralization. Subchondral sclerosis of the femoral heads may be AVN versus secondary to osteopenia. No focal bone lesion. IMPRESSION: 1. Significant cachexia, which has progressed over the past 3 months. In conjunction with  lack of IV contrast, this is a significantly limits assessment. 2. Suspected gastric wall thickening which can be seen with gastritis or peptic ulcer disease. 3. Pancreatic calcifications with otherwise limited evaluation of the pancreas. 4. Lung base findings with persistent but improved pleural effusions and patchy, nodular consolidative airspace opacities that remain nonspecific. Suspect chronic aspiration with debris in the airways. 5. Advanced Aortic Atherosclerosis (ICD10-I70.0). Electronically Signed   By: Jeb Levering M.D.   On: 05/26/2018 22:41   Ct Chest W Contrast  Result Date: 05/30/2018 CLINICAL DATA:  62 year old male with history of pancreatic and throat cancer presenting with abdominal pain. EXAM: CT CHEST, ABDOMEN, AND PELVIS WITH CONTRAST TECHNIQUE: Multidetector CT imaging of the chest, abdomen and pelvis was performed following the standard protocol  during bolus administration of intravenous contrast. CONTRAST:  145mL ISOVUE-300 IOPAMIDOL (ISOVUE-300) INJECTION 61% COMPARISON:  CT of the abdomen pelvis dated 05/26/2018 and 02/07/2018 and chest CT dated 02/07/2018 FINDINGS: CT CHEST FINDINGS Cardiovascular: There is no cardiomegaly. There is pericardial effusion primarily inferior to the heart. Mild atherosclerotic calcification of the thoracic aorta. The origins of the great vessels of the aortic arch are patent. The central pulmonary arteries are patent as visualized. Mediastinum/Nodes: No hilar adenopathy. Enlarged mediastinal lymph nodes measure 15 mm in the AP window. Oral contrast is noted within the esophagus. Lungs/Pleura: Small and loculated appearing right pleural effusions with no significant interval change in size since the CT of 02/07/2018. There are subsegmental subpleural consolidative changes of the lower lobes, likely atelectasis. Pneumonia is not excluded. There is background of emphysema. Endobronchial debris noted in the right mainstem bronchus and bilateral lower lobe  bronchi which may represent secretions or aspiration. There is no pneumothorax. Musculoskeletal: There is diffuse subcutaneous edema and severe cachexia. No acute osseous pathology. CT ABDOMEN PELVIS FINDINGS Hepatobiliary: The liver is unremarkable as visualized. No intrahepatic biliary ductal dilatation. Cholecystectomy. There is air in the common bile duct. There is a 1.8 x 3.6 cm loculated appearing fluid content in the right upper quadrant adjacent to the cholecystectomy clips which is not characterized but may represent a loculated seroma or a biloma. Pancreas: Scattered calcification of the pancreas likely sequela of chronic pancreatitis. The pancreas is poorly visualized and suboptimally evaluated. There is however mild dilatation of the main pancreatic duct measuring approximately 5 mm. Spleen: Normal in size without focal abnormality. Adrenals/Urinary Tract: The adrenal glands are unremarkable. There is no hydronephrosis on either side. Subcentimeter right renal hypodense lesion is too small to characterize. The urinary bladder is distended and grossly unremarkable as visualized. Stomach/Bowel: No bowel obstruction. A tubular appearing structure in the right hemipelvis (coronal series 5 images 61-68) likely represents a normal appendix. Vascular/Lymphatic: There is advanced aortoiliac atherosclerotic disease. No portal venous gas. Evaluation of lymph node is very limited due to cachexia. Reproductive: The prostate and seminal vesicles are poorly visualized. Other: Diffuse subcutaneous edema, anasarca and severe cachexia. Musculoskeletal: No acute osseous pathology. IMPRESSION: 1. Diffuse subcutaneous edema and anasarca as well as severe cachexia limiting evaluation. 2. Loculated appearing small right pleural effusion without significant interval change. 3. Bilateral lower lobe subpleural atelectasis or infiltrate. 4. Bilateral lower lobe endobronchial mucus secretion versus aspiration. 5. Mildly enlarged  mediastinal lymph nodes 6. Loculated appearing fluid adjacent to the cholecystectomy may represent seroma or biloma. 7. No bowel obstruction. Electronically Signed   By: Anner Crete M.D.   On: 05/30/2018 06:30   Ct Abdomen Pelvis W Contrast  Result Date: 05/30/2018 CLINICAL DATA:  62 year old male with history of pancreatic and throat cancer presenting with abdominal pain. EXAM: CT CHEST, ABDOMEN, AND PELVIS WITH CONTRAST TECHNIQUE: Multidetector CT imaging of the chest, abdomen and pelvis was performed following the standard protocol during bolus administration of intravenous contrast. CONTRAST:  173mL ISOVUE-300 IOPAMIDOL (ISOVUE-300) INJECTION 61% COMPARISON:  CT of the abdomen pelvis dated 05/26/2018 and 02/07/2018 and chest CT dated 02/07/2018 FINDINGS: CT CHEST FINDINGS Cardiovascular: There is no cardiomegaly. There is pericardial effusion primarily inferior to the heart. Mild atherosclerotic calcification of the thoracic aorta. The origins of the great vessels of the aortic arch are patent. The central pulmonary arteries are patent as visualized. Mediastinum/Nodes: No hilar adenopathy. Enlarged mediastinal lymph nodes measure 15 mm in the AP window. Oral contrast is noted within the  esophagus. Lungs/Pleura: Small and loculated appearing right pleural effusions with no significant interval change in size since the CT of 02/07/2018. There are subsegmental subpleural consolidative changes of the lower lobes, likely atelectasis. Pneumonia is not excluded. There is background of emphysema. Endobronchial debris noted in the right mainstem bronchus and bilateral lower lobe bronchi which may represent secretions or aspiration. There is no pneumothorax. Musculoskeletal: There is diffuse subcutaneous edema and severe cachexia. No acute osseous pathology. CT ABDOMEN PELVIS FINDINGS Hepatobiliary: The liver is unremarkable as visualized. No intrahepatic biliary ductal dilatation. Cholecystectomy. There is air  in the common bile duct. There is a 1.8 x 3.6 cm loculated appearing fluid content in the right upper quadrant adjacent to the cholecystectomy clips which is not characterized but may represent a loculated seroma or a biloma. Pancreas: Scattered calcification of the pancreas likely sequela of chronic pancreatitis. The pancreas is poorly visualized and suboptimally evaluated. There is however mild dilatation of the main pancreatic duct measuring approximately 5 mm. Spleen: Normal in size without focal abnormality. Adrenals/Urinary Tract: The adrenal glands are unremarkable. There is no hydronephrosis on either side. Subcentimeter right renal hypodense lesion is too small to characterize. The urinary bladder is distended and grossly unremarkable as visualized. Stomach/Bowel: No bowel obstruction. A tubular appearing structure in the right hemipelvis (coronal series 5 images 61-68) likely represents a normal appendix. Vascular/Lymphatic: There is advanced aortoiliac atherosclerotic disease. No portal venous gas. Evaluation of lymph node is very limited due to cachexia. Reproductive: The prostate and seminal vesicles are poorly visualized. Other: Diffuse subcutaneous edema, anasarca and severe cachexia. Musculoskeletal: No acute osseous pathology. IMPRESSION: 1. Diffuse subcutaneous edema and anasarca as well as severe cachexia limiting evaluation. 2. Loculated appearing small right pleural effusion without significant interval change. 3. Bilateral lower lobe subpleural atelectasis or infiltrate. 4. Bilateral lower lobe endobronchial mucus secretion versus aspiration. 5. Mildly enlarged mediastinal lymph nodes 6. Loculated appearing fluid adjacent to the cholecystectomy may represent seroma or biloma. 7. No bowel obstruction. Electronically Signed   By: Anner Crete M.D.   On: 05/30/2018 06:30   Dg Chest Port 1 View  Result Date: 06/09/2018 CLINICAL DATA:  Found unconscious. Hypoglycemic. Altered mental status.  EXAM: PORTABLE CHEST 1 VIEW COMPARISON:  Two-view chest x-ray 05/26/2018 FINDINGS: Heart size normal. Asymmetric right lower lobe airspace disease is present. There is minimal atelectasis at the left base. A lateral right pleural effusion is noted. IMPRESSION: 1. Asymmetric right lower lobe airspace disease and effusion consistent with lobar pneumonia. Aspiration is considered less likely. 2. Minimal airspace disease the left base likely reflects atelectasis. Electronically Signed   By: San Morelle M.D.   On: 06/09/2018 17:14   Dg Chest Portable 1 View  Result Date: 05/26/2018 CLINICAL DATA:  Generalized body ache and weakness. Black stools for the past 3-4 days. History of pancreatic and throat cancer. COPD. EXAM: PORTABLE CHEST 1 VIEW COMPARISON:  04/23/2018 FINDINGS: Emphysematous hyperinflation of the lungs with superimposed left basilar airspace disease, atelectasis and/or scarring. Findings raise concern for small focus of pneumonia. No overt pulmonary edema. No effusion or pneumothorax. Heart size and mediastinal contours are stable. No aggressive osseous lesions. IMPRESSION: 1. Emphysematous hyperinflation of the lungs consistent with COPD. 2. Superimposed left basilar airspace opacity, atelectasis and/or scarring. Small focus of pneumonia is not excluded. Electronically Signed   By: Ashley Royalty M.D.   On: 05/26/2018 23:37    Time Spent in minutes  30   Lala Lund M.D on 06/10/2018 at 12:35 PM  To page go to [email or web address] - password [email or web address]

## 2018-06-11 LAB — GLUCOSE, CAPILLARY
Glucose-Capillary: 69 mg/dL — ABNORMAL LOW (ref 70–99)
Glucose-Capillary: 93 mg/dL (ref 70–99)
Glucose-Capillary: 97 mg/dL (ref 70–99)
Glucose-Capillary: 99 mg/dL (ref 70–99)

## 2018-06-11 LAB — INSULIN, RANDOM

## 2018-06-11 LAB — COMPREHENSIVE METABOLIC PANEL
ALT: 118 U/L — AB (ref 0–44)
ANION GAP: 7 (ref 5–15)
AST: 188 U/L — ABNORMAL HIGH (ref 15–41)
Albumin: 2 g/dL — ABNORMAL LOW (ref 3.5–5.0)
Alkaline Phosphatase: 156 U/L — ABNORMAL HIGH (ref 38–126)
BUN: 12 mg/dL (ref 8–23)
CO2: 29 mmol/L (ref 22–32)
Calcium: 7.5 mg/dL — ABNORMAL LOW (ref 8.9–10.3)
Chloride: 99 mmol/L (ref 98–111)
Creatinine, Ser: 0.45 mg/dL — ABNORMAL LOW (ref 0.61–1.24)
Glucose, Bld: 74 mg/dL (ref 70–99)
Potassium: 2.8 mmol/L — ABNORMAL LOW (ref 3.5–5.1)
SODIUM: 135 mmol/L (ref 135–145)
Total Bilirubin: 0.5 mg/dL (ref 0.3–1.2)
Total Protein: 5.1 g/dL — ABNORMAL LOW (ref 6.5–8.1)

## 2018-06-11 LAB — CBC
HEMATOCRIT: 25.3 % — AB (ref 39.0–52.0)
HEMOGLOBIN: 8.4 g/dL — AB (ref 13.0–17.0)
MCH: 33.5 pg (ref 26.0–34.0)
MCHC: 33.2 g/dL (ref 30.0–36.0)
MCV: 100.8 fL — AB (ref 78.0–100.0)
Platelets: 120 10*3/uL — ABNORMAL LOW (ref 150–400)
RBC: 2.51 MIL/uL — AB (ref 4.22–5.81)
RDW: 15.2 % (ref 11.5–15.5)
WBC: 3.9 10*3/uL — AB (ref 4.0–10.5)

## 2018-06-11 LAB — HIV ANTIBODY (ROUTINE TESTING W REFLEX): HIV Screen 4th Generation wRfx: NONREACTIVE

## 2018-06-11 LAB — LEGIONELLA PNEUMOPHILA SEROGP 1 UR AG: L. PNEUMOPHILA SEROGP 1 UR AG: NEGATIVE

## 2018-06-11 LAB — MAGNESIUM: MAGNESIUM: 1.5 mg/dL — AB (ref 1.7–2.4)

## 2018-06-11 LAB — C-PEPTIDE: C PEPTIDE: 0.7 ng/mL — AB (ref 1.1–4.4)

## 2018-06-11 MED ORDER — SODIUM CHLORIDE 0.9 % IV SOLN
3.0000 g | Freq: Three times a day (TID) | INTRAVENOUS | Status: DC
  Administered 2018-06-11 – 2018-06-19 (×24): 3 g via INTRAVENOUS
  Filled 2018-06-11 (×26): qty 3

## 2018-06-11 MED ORDER — MAGNESIUM SULFATE 2 GM/50ML IV SOLN
2.0000 g | Freq: Once | INTRAVENOUS | Status: AC
  Administered 2018-06-11: 2 g via INTRAVENOUS
  Filled 2018-06-11: qty 50

## 2018-06-11 MED ORDER — POTASSIUM CHLORIDE CRYS ER 20 MEQ PO TBCR
40.0000 meq | EXTENDED_RELEASE_TABLET | Freq: Two times a day (BID) | ORAL | Status: AC
  Administered 2018-06-11 (×2): 40 meq via ORAL
  Filled 2018-06-11 (×2): qty 2

## 2018-06-11 MED ORDER — POTASSIUM CHLORIDE IN NACL 40-0.9 MEQ/L-% IV SOLN
INTRAVENOUS | Status: AC
  Administered 2018-06-11: 100 mL/h via INTRAVENOUS
  Filled 2018-06-11: qty 1000

## 2018-06-11 NOTE — Progress Notes (Signed)
Pharmacy Antibiotic Note  Russell Williamson is a 62 y.o. male admitted on 06/09/2018 with pneumonia.  Pharmacy has been consulted for unasyn dosing.  Plan: Unasyn 3G IV q8 hours  Height: 5\' 5"  (165.1 cm) Weight: 87 lb 15.4 oz (39.9 kg) IBW/kg (Calculated) : 61.5  Temp (24hrs), Avg:97.8 F (36.6 C), Min:97.3 F (36.3 C), Max:98.2 F (36.8 C)  Recent Labs  Lab 06/09/18 1846 06/09/18 1855 06/09/18 2153 06/10/18 0729 06/11/18 0418  WBC 2.4*  --   --  3.3* 3.9*  CREATININE 0.65 0.70  --   --  0.45*  LATICACIDVEN  --  1.41 1.26 1.4  --     Estimated Creatinine Clearance: 54 mL/min (A) (by C-G formula based on SCr of 0.45 mg/dL (L)).    No Known Allergies  Thank you for allowing pharmacy to be a part of this patient's care.  Jodean Lima Paulina Muchmore 06/11/2018 12:19 PM

## 2018-06-11 NOTE — Evaluation (Signed)
Physical Therapy Evaluation Patient Details Name: Russell Williamson MRN: [id number] DOB: 09/21/1956 Today's Date: 06/11/2018   History of Present Illness  Russell Williamson is a 62 y.o. male with medical history significant of hypertension, COPD, GERD, depression, anemia, chronic pancreatitis, tonsillar cancer (radiation therapy in remission), tobacco abuse, cocaine abuse, GERD, depression, who presents with altered mental status, cough, shortness of breath and abdominal pain    Clinical Impression  Pt admitted with above diagnosis. Pt currently with functional limitations due to the deficits listed below (see PT Problem List). PTA, pt reports living at home with niece, walking with SPC. Pt appears malnourished and living a sedentary lifestyle. Cognitively following commands and appears appropriate.  Mod I for bed mobility, min A to stand and provide stability during transfer, deferred ambulating as pt refuses reporting he is too weak, also desats on 7L to 79%, returned seated and quickly rose back into 90s. PVCS noted on telemetry seated EOB. Suggested SNF but patient states he would like to go home.  Pt will benefit from skilled PT to increase their independence and safety with mobility to allow discharge to the venue listed below.       Follow Up Recommendations Home health PT;Supervision for mobility/OOB (declining SNF)    Equipment Recommendations  None recommended by PT    Recommendations for Other Services OT consult     Precautions / Restrictions Precautions Precautions: Fall Restrictions Weight Bearing Restrictions: No      Mobility  Bed Mobility Overal bed mobility: Modified Independent                Transfers Overall transfer level: Needs assistance   Transfers: Sit to/from Stand Sit to Stand: Min assist         General transfer comment: Min A to stand, pt desats quickly on 7L to 79%, retruned to seated position rose back [place].   Ambulation/Gait              General Gait Details: defered for pt safety  Stairs            Wheelchair Mobility    Modified Rankin (Stroke Patients Only)       Balance Overall balance assessment: Mild deficits observed, not formally tested                                           Pertinent Vitals/Pain Pain Assessment: No/denies pain    Home Living Family/patient expects to be discharged to:: Private residence Living Arrangements: Other relatives Available Help at Discharge: Family;Available PRN/intermittently Type of Home: House Home Access: Stairs to enter   Entrance Stairs-Number of Steps: 1 Home Layout: One level Home Equipment: Kasandra Knudsen - single point Additional Comments: Lives with niece and her kids.     Prior Function Level of Independence: Independent with assistive device(s)               Hand Dominance        Extremity/Trunk Assessment   Upper Extremity Assessment Upper Extremity Assessment: Generalized weakness    Lower Extremity Assessment Lower Extremity Assessment: Generalized weakness       Communication      Cognition Arousal/Alertness: Awake/alert                                     General Comments:  AO x3 did not know why was in hospital. following all commands       General Comments      Exercises     Assessment/Plan    PT Assessment Patient needs continued PT services  PT Problem List Decreased strength;Decreased range of motion;Decreased activity tolerance;Decreased balance;Decreased mobility       PT Treatment Interventions DME instruction;Gait training;Stair training;Functional mobility training;Therapeutic activities;Therapeutic exercise;Balance training    PT Goals (Current goals can be found in the Care Plan section)  Acute Rehab PT Goals Patient Stated Goal: go home, not interested in SNF PT Goal Formulation: With patient Time For Goal Achievement: 06/18/18 Potential to Achieve Goals:  Fair    Frequency Min 3X/week   Barriers to discharge Decreased caregiver support      Co-evaluation               AM-PAC PT "6 Clicks" Daily Activity  Outcome Measure Difficulty turning over in bed (including adjusting bedclothes, sheets and blankets)?: None Difficulty moving from lying on back to sitting on the side of the bed? : None Difficulty sitting down on and standing up from a chair with arms (e.g., wheelchair, bedside commode, etc,.)?: None Help needed moving to and from a bed to chair (including a wheelchair)?: A Little Help needed walking in hospital room?: A Lot Help needed climbing 3-5 steps with a railing? : Total 6 Click Score: 18    End of Session Equipment Utilized During Treatment: Gait belt Activity Tolerance: Patient tolerated treatment well Patient left: in bed;with call bell/phone within reach;with bed alarm set Nurse Communication: Mobility status PT Visit Diagnosis: Unsteadiness on feet (R26.81);Other abnormalities of gait and mobility (R26.89)    Time: 2706-2376 PT Time Calculation (min) (ACUTE ONLY): 30 min   Charges:   PT Evaluation $PT Eval Moderate Complexity: 1 Mod          Reinaldo Berber, PT, DPT Acute Rehabilitation Services Pager: [phone] Office: [phone]    Reinaldo Berber 06/11/2018, 5:37 PM

## 2018-06-11 NOTE — Progress Notes (Signed)
  Speech Language Pathology Treatment: Dysphagia  Patient Details Name: Russell Williamson MRN: [id number] DOB: 08-Jan-1956 Today's Date: 06/11/2018 Time: 1120-1140 SLP Time Calculation (min) (ACUTE ONLY): 20 min  Assessment / Plan / Recommendation Clinical Impression  ST follow up for therapeutic diet tolerance and possible family education.  Unfortunately no family was present at the bedside.  Chart review indicated that the patient has has been afebrile and lungs are diminished at the bases with rhonchi in the upper lobes.  The patient was unable to independently recall safe swallow precautions.  Given max cues he was able to recall them but did not use them independently without a cue.  Volitional cough sounded weak.  Given pureed material and honey thick liquids via spoon sips that patient presented with intermittent cough following honey thick liquids via spoon sips.  He required consistent cue to use the cough/reswallow strategy.  Suspect that the patient is having continued issues swallowing.  Discussed possible risks of aspiration with the patient and he wanted to continue with current diet.  ST will follow for continued education and therapeutic diet tolerance.    HPI HPI: Russell Williamson a 62 y.o.malewith medical history significant ofhypertension, COPD, GERD, depression, anemia, chronic pancreatitis, tonsillar cancer (radiation therapy in remission), tobacco abuse, cocaine abuse, GERD, depression admitted with sepsis with right lower lobe pneumonia, hypothermia and hypoglycemia. Known to ST from prior admissions, with severe, chronic pharyngoesophageal dysphagia. Per MBS 02/15/18, dys 1, honey by teaspoon was recommended.       SLP Plan  Continue with current plan of care       Recommendations  Diet recommendations: Dysphagia 1 (puree);Honey-thick liquid Liquids provided via: Teaspoon Medication Administration: Crushed with puree Supervision: Staff to assist with self  feeding Compensations: Slow rate;Small sips/bites;Multiple dry swallows after each bite/sip;Hard cough after swallow;Effortful swallow Postural Changes and/or Swallow Maneuvers: Seated upright 90 degrees                Oral Care Recommendations: Oral care before and after PO Follow up Recommendations: Other (comment)(TBD) SLP Visit Diagnosis: Dysphagia, pharyngoesophageal phase (R13.14) Plan: Continue with current plan of care       [place], MA, CCC-SLP Acute Rehab SLP [phone]  Lamar Sprinkles 06/11/2018, 12:07 PM

## 2018-06-11 NOTE — Progress Notes (Signed)
Pt's 0630 CBG was 69. After 2 apple juice, recheck CBG at 0736 was 97. Passed on to day shift RN.

## 2018-06-11 NOTE — Progress Notes (Signed)
@IPLOG @        PROGRESS NOTE                                                                                                                                                                                                             Patient Demographics:    Russell Williamson, is a 62 y.o. male, DOB - November 02, 1955, JGG:[id number]  Admit date - 06/09/2018   Admitting Physician Ivor Costa, MD  Outpatient Primary MD for the patient is Harvie Junior, MD  LOS - 2  Chief Complaint  Patient presents with  . Hypoglycemia       Brief Narrative  Russell Williamson is a 62 y.o. male with medical history significant of hypertension, COPD, GERD, depression, anemia, chronic pancreatitis, tonsillar cancer (radiation therapy in remission), tobacco abuse, cocaine abuse, GERD, depression, who presents with altered mental status, cough, shortness of breath and abdominal pain.   Subjective:   Patient in bed, appears comfortable, denies any headache, no fever, no chest pain or pressure, no shortness of breath but positive productive cough, no abdominal pain. No focal weakness.   Assessment  & Plan :     1.  Sepsis with right lower lobe pneumonia most likely aspiration, hypothermia and hypoglycemia.  He is severely cachectic with very poor and weak cough reflex, speech therapy following, he is currently on a dysphagia 1 diet, will switch antibiotics to IV Unasyn on 06/11/2018.  Continue supportive care with oxygen and nebulizer treatments, temperature stable will give a trial of removing bed hugger.  Monitor closely.  2.  Chronic pancreatitis with severe cachexia, severe protein calorie malnutrition.  Continue pancreatic enzyme supplementation and protein supplementation.  Outpatient GI follow-up.  Have consulted dietitian here.  3.  Chronic abdominal pain.  Due to #2 above.  Supportive care.  4.  GERD.  On PPI and Carafate continue.  5.  Ongoing smoking.  Counseled to quit.  NicoDerm patch.  6.   Hypomagnesemia.  Replaced will monitor.  7.  COPD.  Stable no acute issues no wheezing.  8.  Possible underlying dysphagia.  Speech eval currently on dysphagia 1 diet with feeding assistance.    Family Communication  :  None  Code Status :  Full  Disposition Plan  :  Stepdown  Consults  : None  Procedures  :     DVT Prophylaxis  :  Lovenox   Lab Results  Component Value Date   PLT 120 (L) 06/11/2018    Diet :  Diet Order  DIET - DYS 1 Room service appropriate? Yes; Fluid consistency: Honey Thick  Diet effective now               Inpatient Medications Scheduled Meds: . Chlorhexidine Gluconate Cloth  6 each Topical Q0600  . enoxaparin (LOVENOX) injection  40 mg Subcutaneous Q24H  . feeding supplement (ENSURE ENLIVE)  474 mL Oral TID BM  . feeding supplement (PRO-STAT SUGAR FREE 64)  30 mL Oral TID WC  . folic acid  1 mg Oral Daily  . Influenza vac split quadrivalent PF  0.5 mL Intramuscular Tomorrow-1000  . lipase/protease/amylase  12,000 Units Oral TID WC & HS  . mirtazapine  30 mg Oral QHS  . multivitamin with minerals  1 tablet Oral Daily  . mupirocin ointment  1 application Nasal BID  . nicotine  21 mg Transdermal Daily  . pantoprazole  20 mg Oral BID  . potassium chloride  40 mEq Oral BID  . sucralfate  1 g Oral TID WC & HS   Continuous Infusions: . 0.9 % NaCl with KCl 40 mEq / L 100 mL/hr (06/11/18 1055)   PRN Meds:.albuterol, dextromethorphan-guaiFENesin, dextrose, hydrALAZINE, iopamidol, ondansetron (ZOFRAN) IV, oxyCODONE, RESOURCE THICKENUP CLEAR, zolpidem  Antibiotics  :   Anti-infectives (From admission, onward)   Start     Dose/Rate Route Frequency Ordered Stop   06/10/18 2000  cefTRIAXone (ROCEPHIN) 1 g in sodium chloride 0.9 % 100 mL IVPB  Status:  Discontinued     1 g 200 mL/hr over 30 Minutes Intravenous Every 24 hours 06/09/18 2134 06/11/18 1129   06/10/18 2000  azithromycin (ZITHROMAX) 500 mg in sodium chloride 0.9 % 250 mL IVPB   Status:  Discontinued     500 mg 250 mL/hr over 60 Minutes Intravenous Every 24 hours 06/09/18 2134 06/11/18 1129   06/09/18 2200  metroNIDAZOLE (FLAGYL) tablet 250 mg  Status:  Discontinued     250 mg Oral Every 8 hours 06/09/18 2135 06/11/18 1129   06/09/18 1730  cefTRIAXone (ROCEPHIN) 1 g in sodium chloride 0.9 % 100 mL IVPB     1 g 200 mL/hr over 30 Minutes Intravenous  Once 06/09/18 1720 06/09/18 2334   06/09/18 1730  azithromycin (ZITHROMAX) 500 mg in sodium chloride 0.9 % 250 mL IVPB     500 mg 250 mL/hr over 60 Minutes Intravenous  Once 06/09/18 1720 06/09/18 2311          Objective:   Vitals:   06/11/18 0005 06/11/18 0400 06/11/18 0546 06/11/18 0752  BP: 116/81 104/60 114/67 109/73  Pulse: 72 69 69 73  Resp: 15 15 17 18   Temp: 97.9 F (36.6 C)  98.2 F (36.8 C) 98.1 F (36.7 C)  TempSrc: Oral  Oral Oral  SpO2: 98% 99% 99% 93%  Weight:      Height:        Wt Readings from Last 3 Encounters:  06/09/18 39.9 kg  04/23/18 36.3 kg  02/18/18 48.9 kg     Intake/Output Summary (Last 24 hours) at 06/11/2018 1130 Last data filed at 06/11/2018 0547 Gross per 24 hour  Intake 2801.98 ml  Output 1725 ml  Net 1076.98 ml     Physical Exam  Awake Alert, severely cachetic, Oriented X 3, No new F.N deficits, Normal affect [place].AT,PERRAL Supple Neck,No JVD, No cervical lymphadenopathy appriciated.  Symmetrical Chest wall movement, Good air movement bilaterally, CTAB RRR,No Gallops, Rubs or new Murmurs, No Parasternal Heave +ve B.Sounds, Abd Soft, No tenderness, No organomegaly appriciated,  No rebound - guarding or rigidity. No Cyanosis, Clubbing or edema, No new Rash or bruise     Data Review:    CBC Recent Labs  Lab 06/09/18 1846 06/09/18 1855 06/10/18 0729 06/11/18 0418  WBC 2.4*  --  3.3* 3.9*  HGB 9.3* 10.2* 8.8* 8.4*  HCT 28.6* 30.0* 25.7* 25.3*  PLT 137*  --  104* 120*  MCV 103.6*  --  101.2* 100.8*  MCH 33.7  --  34.6* 33.5  MCHC 32.5  --  34.2 33.2   RDW 15.7*  --  15.2 15.2  LYMPHSABS 0.1*  --   --   --   MONOABS 0.2  --   --   --   EOSABS 0.0  --   --   --   BASOSABS 0.0  --   --   --     Chemistries  Recent Labs  Lab 06/09/18 1846 06/09/18 1855 06/10/18 0729 06/11/18 0418  NA 133* 132*  --  135  K 3.7 3.9  --  2.8*  CL 97* 94*  --  99  CO2 28  --   --  29  GLUCOSE 238* 234*  --  74  BUN 34* 43*  --  12  CREATININE 0.65 0.70  --  0.45*  CALCIUM 7.6*  --   --  7.5*  MG  --   --  1.3* 1.5*  AST 498*  --   --  188*  ALT 158*  --   --  118*  ALKPHOS 221*  --   --  156*  BILITOT 0.9  --   --  0.5   ------------------------------------------------------------------------------------------------------------------ No results for input(s): CHOL, HDL, LDLCALC, TRIG, CHOLHDL, LDLDIRECT in the last 72 hours.  Lab Results  Component Value Date   HGBA1C 6.2 (H) 02/11/2018   ------------------------------------------------------------------------------------------------------------------ No results for input(s): TSH, T4TOTAL, T3FREE, THYROIDAB in the last 72 hours.  Invalid input(s): FREET3 ------------------------------------------------------------------------------------------------------------------ No results for input(s): VITAMINB12, FOLATE, FERRITIN, TIBC, IRON, RETICCTPCT in the last 72 hours.  Coagulation profile No results for input(s): INR, PROTIME in the last 168 hours.  No results for input(s): DDIMER in the last 72 hours.  Cardiac Enzymes No results for input(s): CKMB, TROPONINI, MYOGLOBIN in the last 168 hours.  Invalid input(s): CK ------------------------------------------------------------------------------------------------------------------    Component Value Date/Time   BNP 495.7 (H) 02/14/2018 0353    Micro Results Recent Results (from the past 240 hour(s))  Blood culture (routine x 2)     Status: None (Preliminary result)   Collection Time: 06/09/18  5:20 PM  Result Value Ref Range Status    Specimen Description BLOOD BLOOD LEFT WRIST  Final   Special Requests   Final    BOTTLES DRAWN AEROBIC ONLY Blood Culture results may not be optimal due to an inadequate volume of blood received in culture bottles   Culture   Final    NO GROWTH 2 DAYS Performed at [facility] [address], [place], [place] [address]    Report Status PENDING  Incomplete  Blood culture (routine x 2)     Status: None (Preliminary result)   Collection Time: 06/09/18  5:25 PM  Result Value Ref Range Status   Specimen Description BLOOD RIGHT ANTECUBITAL  Final   Special Requests   Final    BOTTLES DRAWN AEROBIC AND ANAEROBIC Blood Culture adequate volume   Culture   Final    NO GROWTH 2 DAYS Performed at [facility], [address] [place], [place] [address]    Report Status PENDING  Incomplete  Respiratory Panel by PCR     Status: None   Collection Time: 06/10/18 12:15 AM  Result Value Ref Range Status   Adenovirus NOT DETECTED NOT DETECTED Final   Coronavirus 229E NOT DETECTED NOT DETECTED Final   Coronavirus HKU1 NOT DETECTED NOT DETECTED Final   Coronavirus NL63 NOT DETECTED NOT DETECTED Final   Coronavirus OC43 NOT DETECTED NOT DETECTED Final   Metapneumovirus NOT DETECTED NOT DETECTED Final   Rhinovirus / Enterovirus NOT DETECTED NOT DETECTED Final   Influenza A NOT DETECTED NOT DETECTED Final   Influenza B NOT DETECTED NOT DETECTED Final   Parainfluenza Virus 1 NOT DETECTED NOT DETECTED Final   Parainfluenza Virus 2 NOT DETECTED NOT DETECTED Final   Parainfluenza Virus 3 NOT DETECTED NOT DETECTED Final   Parainfluenza Virus 4 NOT DETECTED NOT DETECTED Final   Respiratory Syncytial Virus NOT DETECTED NOT DETECTED Final   Bordetella pertussis NOT DETECTED NOT DETECTED Final   Chlamydophila pneumoniae NOT DETECTED NOT DETECTED Final   Mycoplasma pneumoniae NOT DETECTED NOT DETECTED Final    Comment: Performed at [facility], [place] [address] [place], [place] [address]     Radiology Reports Ct Abdomen Pelvis Wo Contrast  Result Date: 05/26/2018 CLINICAL DATA:  Nausea and vomiting. Abdominal pain. Patient reports generalized body aches and weakness over the past week. Patient reports history of pancreatic and throat cancer. EXAM: CT ABDOMEN AND PELVIS WITHOUT CONTRAST TECHNIQUE: Multidetector CT imaging of the abdomen and pelvis was performed following the standard protocol without IV contrast. COMPARISON:  CT 02/07/2018 FINDINGS: Lower chest: Diminished pleural effusions from prior CT with mild right-greater-than-left residual. Associated bibasilar patchy and confluent airspace opacities which appear slightly nodular, also with mild improvement. Emphysema noted. Hepatobiliary: Lack of IV contrast and cachexia significantly limits detailed assessment. Particularly, limited assessment for focal hepatic lesion. Pneumobilia in the left lobe likely postprocedural. No evidence of focal lesion on noncontrast exam. Postcholecystectomy. Biliary tree not well visualized. Pancreas: Pancreas not well seen given cachexia and lack of IV contrast. There are pancreatic calcifications, which are better delineated on prior contrast-enhanced exam. No gross peripancreatic inflammation allowing limitations. Spleen: Normal in size but otherwise limited evaluation. Adrenals/Urinary Tract: Adrenal glands not visualized. Kidneys demonstrate diffuse cortical low-density with increased density in the renal pyramids, of unknown significance. Urinary bladder is physiologically distended. No evidence of wall thickening. Stomach/Bowel: Generalized cachexia paucity of intra-abdominal fat limits assessment. Suspected diffuse gastric wall thickening. No bowel dilatation to suggest obstruction. Formed stool in the distal colon. Proximal colon is not well assessed. Vascular/Lymphatic: Advanced aortic and branch atherosclerosis. Significantly limited evaluation for adenopathy. Air in the region of the left iliac  vein, parent suggesting attempted catheter placement. Reproductive: Prostate gland grossly normal. Other: Progressive cachexia over the past 3 months. Suspected mild mesenteric and body wall edema. Musculoskeletal: Bony under mineralization. Subchondral sclerosis of the femoral heads may be AVN versus secondary to osteopenia. No focal bone lesion. IMPRESSION: 1. Significant cachexia, which has progressed over the past 3 months. In conjunction with lack of IV contrast, this is a significantly limits assessment. 2. Suspected gastric wall thickening which can be seen with gastritis or peptic ulcer disease. 3. Pancreatic calcifications with otherwise limited evaluation of the pancreas. 4. Lung base findings with persistent but improved pleural effusions and patchy, nodular consolidative airspace opacities that remain nonspecific. Suspect chronic aspiration with debris in the airways. 5. Advanced Aortic  Atherosclerosis (ICD10-I70.0). Electronically Signed   By: Jeb Levering M.D.   On: 05/26/2018 22:41   Ct Chest W Contrast  Result Date: 05/30/2018 CLINICAL DATA:  62 year old male with history of pancreatic and throat cancer presenting with abdominal pain. EXAM: CT CHEST, ABDOMEN, AND PELVIS WITH CONTRAST TECHNIQUE: Multidetector CT imaging of the chest, abdomen and pelvis was performed following the standard protocol during bolus administration of intravenous contrast. CONTRAST:  122mL ISOVUE-300 IOPAMIDOL (ISOVUE-300) INJECTION 61% COMPARISON:  CT of the abdomen pelvis dated 05/26/2018 and 02/07/2018 and chest CT dated 02/07/2018 FINDINGS: CT CHEST FINDINGS Cardiovascular: There is no cardiomegaly. There is pericardial effusion primarily inferior to the heart. Mild atherosclerotic calcification of the thoracic aorta. The origins of the great vessels of the aortic arch are patent. The central pulmonary arteries are patent as visualized. Mediastinum/Nodes: No hilar adenopathy. Enlarged mediastinal lymph nodes  measure 15 mm in the AP window. Oral contrast is noted within the esophagus. Lungs/Pleura: Small and loculated appearing right pleural effusions with no significant interval change in size since the CT of 02/07/2018. There are subsegmental subpleural consolidative changes of the lower lobes, likely atelectasis. Pneumonia is not excluded. There is background of emphysema. Endobronchial debris noted in the right mainstem bronchus and bilateral lower lobe bronchi which may represent secretions or aspiration. There is no pneumothorax. Musculoskeletal: There is diffuse subcutaneous edema and severe cachexia. No acute osseous pathology. CT ABDOMEN PELVIS FINDINGS Hepatobiliary: The liver is unremarkable as visualized. No intrahepatic biliary ductal dilatation. Cholecystectomy. There is air in the common bile duct. There is a 1.8 x 3.6 cm loculated appearing fluid content in the right upper quadrant adjacent to the cholecystectomy clips which is not characterized but may represent a loculated seroma or a biloma. Pancreas: Scattered calcification of the pancreas likely sequela of chronic pancreatitis. The pancreas is poorly visualized and suboptimally evaluated. There is however mild dilatation of the main pancreatic duct measuring approximately 5 mm. Spleen: Normal in size without focal abnormality. Adrenals/Urinary Tract: The adrenal glands are unremarkable. There is no hydronephrosis on either side. Subcentimeter right renal hypodense lesion is too small to characterize. The urinary bladder is distended and grossly unremarkable as visualized. Stomach/Bowel: No bowel obstruction. A tubular appearing structure in the right hemipelvis (coronal series 5 images 61-68) likely represents a normal appendix. Vascular/Lymphatic: There is advanced aortoiliac atherosclerotic disease. No portal venous gas. Evaluation of lymph node is very limited due to cachexia. Reproductive: The prostate and seminal vesicles are poorly visualized.  Other: Diffuse subcutaneous edema, anasarca and severe cachexia. Musculoskeletal: No acute osseous pathology. IMPRESSION: 1. Diffuse subcutaneous edema and anasarca as well as severe cachexia limiting evaluation. 2. Loculated appearing small right pleural effusion without significant interval change. 3. Bilateral lower lobe subpleural atelectasis or infiltrate. 4. Bilateral lower lobe endobronchial mucus secretion versus aspiration. 5. Mildly enlarged mediastinal lymph nodes 6. Loculated appearing fluid adjacent to the cholecystectomy may represent seroma or biloma. 7. No bowel obstruction. Electronically Signed   By: Anner Crete M.D.   On: 05/30/2018 06:30   Ct Abdomen Pelvis W Contrast  Result Date: 05/30/2018 CLINICAL DATA:  62 year old male with history of pancreatic and throat cancer presenting with abdominal pain. EXAM: CT CHEST, ABDOMEN, AND PELVIS WITH CONTRAST TECHNIQUE: Multidetector CT imaging of the chest, abdomen and pelvis was performed following the standard protocol during bolus administration of intravenous contrast. CONTRAST:  142mL ISOVUE-300 IOPAMIDOL (ISOVUE-300) INJECTION 61% COMPARISON:  CT of the abdomen pelvis dated 05/26/2018 and 02/07/2018 and chest CT dated 02/07/2018  FINDINGS: CT CHEST FINDINGS Cardiovascular: There is no cardiomegaly. There is pericardial effusion primarily inferior to the heart. Mild atherosclerotic calcification of the thoracic aorta. The origins of the great vessels of the aortic arch are patent. The central pulmonary arteries are patent as visualized. Mediastinum/Nodes: No hilar adenopathy. Enlarged mediastinal lymph nodes measure 15 mm in the AP window. Oral contrast is noted within the esophagus. Lungs/Pleura: Small and loculated appearing right pleural effusions with no significant interval change in size since the CT of 02/07/2018. There are subsegmental subpleural consolidative changes of the lower lobes, likely atelectasis. Pneumonia is not excluded.  There is background of emphysema. Endobronchial debris noted in the right mainstem bronchus and bilateral lower lobe bronchi which may represent secretions or aspiration. There is no pneumothorax. Musculoskeletal: There is diffuse subcutaneous edema and severe cachexia. No acute osseous pathology. CT ABDOMEN PELVIS FINDINGS Hepatobiliary: The liver is unremarkable as visualized. No intrahepatic biliary ductal dilatation. Cholecystectomy. There is air in the common bile duct. There is a 1.8 x 3.6 cm loculated appearing fluid content in the right upper quadrant adjacent to the cholecystectomy clips which is not characterized but may represent a loculated seroma or a biloma. Pancreas: Scattered calcification of the pancreas likely sequela of chronic pancreatitis. The pancreas is poorly visualized and suboptimally evaluated. There is however mild dilatation of the main pancreatic duct measuring approximately 5 mm. Spleen: Normal in size without focal abnormality. Adrenals/Urinary Tract: The adrenal glands are unremarkable. There is no hydronephrosis on either side. Subcentimeter right renal hypodense lesion is too small to characterize. The urinary bladder is distended and grossly unremarkable as visualized. Stomach/Bowel: No bowel obstruction. A tubular appearing structure in the right hemipelvis (coronal series 5 images 61-68) likely represents a normal appendix. Vascular/Lymphatic: There is advanced aortoiliac atherosclerotic disease. No portal venous gas. Evaluation of lymph node is very limited due to cachexia. Reproductive: The prostate and seminal vesicles are poorly visualized. Other: Diffuse subcutaneous edema, anasarca and severe cachexia. Musculoskeletal: No acute osseous pathology. IMPRESSION: 1. Diffuse subcutaneous edema and anasarca as well as severe cachexia limiting evaluation. 2. Loculated appearing small right pleural effusion without significant interval change. 3. Bilateral lower lobe subpleural  atelectasis or infiltrate. 4. Bilateral lower lobe endobronchial mucus secretion versus aspiration. 5. Mildly enlarged mediastinal lymph nodes 6. Loculated appearing fluid adjacent to the cholecystectomy may represent seroma or biloma. 7. No bowel obstruction. Electronically Signed   By: Anner Crete M.D.   On: 05/30/2018 06:30   Dg Chest Port 1 View  Result Date: 06/09/2018 CLINICAL DATA:  Found unconscious. Hypoglycemic. Altered mental status. EXAM: PORTABLE CHEST 1 VIEW COMPARISON:  Two-view chest x-ray 05/26/2018 FINDINGS: Heart size normal. Asymmetric right lower lobe airspace disease is present. There is minimal atelectasis at the left base. A lateral right pleural effusion is noted. IMPRESSION: 1. Asymmetric right lower lobe airspace disease and effusion consistent with lobar pneumonia. Aspiration is considered less likely. 2. Minimal airspace disease the left base likely reflects atelectasis. Electronically Signed   By: San Morelle M.D.   On: 06/09/2018 17:14   Dg Chest Portable 1 View  Result Date: 05/26/2018 CLINICAL DATA:  Generalized body ache and weakness. Black stools for the past 3-4 days. History of pancreatic and throat cancer. COPD. EXAM: PORTABLE CHEST 1 VIEW COMPARISON:  04/23/2018 FINDINGS: Emphysematous hyperinflation of the lungs with superimposed left basilar airspace disease, atelectasis and/or scarring. Findings raise concern for small focus of pneumonia. No overt pulmonary edema. No effusion or pneumothorax. Heart size and mediastinal  contours are stable. No aggressive osseous lesions. IMPRESSION: 1. Emphysematous hyperinflation of the lungs consistent with COPD. 2. Superimposed left basilar airspace opacity, atelectasis and/or scarring. Small focus of pneumonia is not excluded. Electronically Signed   By: Ashley Royalty M.D.   On: 05/26/2018 23:37    Time Spent in minutes  30   Lala Lund M.D on 06/11/2018 at 11:30 AM  To page go to [email or web address] - password  [email or web address]

## 2018-06-12 LAB — CBC
HEMATOCRIT: 30 % — AB (ref 39.0–52.0)
Hemoglobin: 10.3 g/dL — ABNORMAL LOW (ref 13.0–17.0)
MCH: 34.3 pg — ABNORMAL HIGH (ref 26.0–34.0)
MCHC: 34.3 g/dL (ref 30.0–36.0)
MCV: 100 fL (ref 78.0–100.0)
Platelets: 104 10*3/uL — ABNORMAL LOW (ref 150–400)
RBC: 3 MIL/uL — AB (ref 4.22–5.81)
RDW: 15.1 % (ref 11.5–15.5)
WBC: 3.5 10*3/uL — AB (ref 4.0–10.5)

## 2018-06-12 LAB — GLUCOSE, CAPILLARY
GLUCOSE-CAPILLARY: 155 mg/dL — AB (ref 70–99)
GLUCOSE-CAPILLARY: 119 mg/dL — AB (ref 70–99)
Glucose-Capillary: 158 mg/dL — ABNORMAL HIGH (ref 70–99)

## 2018-06-12 LAB — COMPREHENSIVE METABOLIC PANEL
ALT: 101 U/L — ABNORMAL HIGH (ref 0–44)
ANION GAP: 12 (ref 5–15)
AST: 133 U/L — ABNORMAL HIGH (ref 15–41)
Albumin: 2.2 g/dL — ABNORMAL LOW (ref 3.5–5.0)
Alkaline Phosphatase: 151 U/L — ABNORMAL HIGH (ref 38–126)
BILIRUBIN TOTAL: 0.6 mg/dL (ref 0.3–1.2)
BUN: 9 mg/dL (ref 8–23)
CHLORIDE: 95 mmol/L — AB (ref 98–111)
CO2: 31 mmol/L (ref 22–32)
Calcium: 8.1 mg/dL — ABNORMAL LOW (ref 8.9–10.3)
Creatinine, Ser: 0.36 mg/dL — ABNORMAL LOW (ref 0.61–1.24)
Glucose, Bld: 134 mg/dL — ABNORMAL HIGH (ref 70–99)
POTASSIUM: 4.3 mmol/L (ref 3.5–5.1)
Sodium: 138 mmol/L (ref 135–145)
TOTAL PROTEIN: 5.8 g/dL — AB (ref 6.5–8.1)

## 2018-06-12 LAB — HEPATITIS PANEL, ACUTE
HCV Ab: 0.2 s/co ratio (ref 0.0–0.9)
HEP A IGM: NEGATIVE
HEP B C IGM: NEGATIVE
Hepatitis B Surface Ag: NEGATIVE

## 2018-06-12 LAB — MAGNESIUM: Magnesium: 1.3 mg/dL — ABNORMAL LOW (ref 1.7–2.4)

## 2018-06-12 MED ORDER — MAGNESIUM SULFATE 4 GM/100ML IV SOLN
4.0000 g | Freq: Once | INTRAVENOUS | Status: AC
  Administered 2018-06-12: 4 g via INTRAVENOUS
  Filled 2018-06-12: qty 100

## 2018-06-12 MED ORDER — ALBUTEROL SULFATE (2.5 MG/3ML) 0.083% IN NEBU: 2.5000 mg | INHALATION_SOLUTION | RESPIRATORY_TRACT | Status: DC | PRN

## 2018-06-12 NOTE — Progress Notes (Signed)
Physical Therapy Treatment Patient Details Name: Russell Williamson MRN: [id number] DOB: 12/30/1955 Today's Date: 06/12/2018    History of Present Illness Russell Williamson is a 62 y.o. male with medical history significant of hypertension, COPD, GERD, depression, anemia, chronic pancreatitis, tonsillar cancer (radiation therapy in remission), tobacco abuse, cocaine abuse, GERD, depression, who presents with altered mental status, cough, shortness of breath and abdominal pain    PT Comments    Pt continues to be limited in mobility secondary to pain and generalized weakness. Pt gross min A to min G with ambulation with RW for safety. Noted pt on 2L of O2 via [place] at time of session. Pt O2 at 89-90% with ambulation to chair, increasing to 95% with PLB upon sitting. Pt reports niece will be starting back to school soon and will have decreased support at d/c. Recommending SNF at d/c to maximize safety with mobility if pt agreeable. If pt refuses SNF, recommending HHPT. Will continue to follow and progress as able per POC.      Follow Up Recommendations  SNF;Supervision/Assistance - 24 hour     Equipment Recommendations  Rolling walker with 5" wheels    Recommendations for Other Services OT consult     Precautions / Restrictions Precautions Precautions: Fall Restrictions Weight Bearing Restrictions: No    Mobility  Bed Mobility Overal bed mobility: Needs Assistance Bed Mobility: Supine to Sit     Supine to sit: Supervision;HOB elevated     General bed mobility comments: Pt requird min cues for safety and technique. Pt reports increase in pain s/p bed mobility in abdomen. VCs to scoot EOB.   Transfers Overall transfer level: Needs assistance Equipment used: Rolling walker (2 wheeled) Transfers: Sit to/from [facility] Sit to Stand: Min assist;Min guard         General transfer comment: Min A initially required to stand. Upon standing on the first attempt pt  demonstrating unsteadiness and was unable to remain upright with reports of LE weakness. Upon standing on second trial pt utilized RW progressing to min G for safety. Pt required min cues for hand placement. Pt on 2L of O2 via [place] at time of session, with O2 levels desaturated to 89% upon standing.   Ambulation/Gait Ambulation/Gait assistance: Min assist Gait Distance (Feet): 5 Feet Assistive device: Rolling walker (2 wheeled) Gait Pattern/deviations: Step-to pattern;Wide base of support Gait velocity: decreased Gait velocity interpretation: <1.8 ft/sec, indicate of risk for recurrent falls General Gait Details: Min A required to assist pt in order for pt to remain within close proximity to RW while ambulating backwards to chair. O2 sats noted aroud 89-90% navigating from bed to chair.     Stairs             Wheelchair Mobility    Modified Rankin (Stroke Patients Only)       Balance Overall balance assessment: Needs assistance Sitting-balance support: Single extremity supported;Feet supported Sitting balance-Leahy Scale: Fair     Standing balance support: Bilateral upper extremity supported;During functional activity Standing balance-Leahy Scale: Poor Standing balance comment: BIL UE support required for static and dynamic standing balance activities                            Cognition Arousal/Alertness: Awake/alert Behavior During Therapy: Anxious Overall Cognitive Status: Difficult to assess  General Comments: Pt reports he is unsure how many liters of O2 he uses at baseline      Exercises General Exercises - Lower Extremity Gluteal Sets: AROM;Both;10 reps;Seated Long Arc Quad: AROM;Both;10 reps;Seated Hip ABduction/ADduction: AROM;Both;Seated;5 reps Hip Flexion/Marching: AROM;5 reps;Seated(d/c due to pain in "pancreas")    General Comments General comments (skin integrity, edema, etc.): Pt on 2L O2 via  [place] throughout session      Pertinent Vitals/Pain Pain Assessment: 0-10 Pain Score: 9  Pain Location: stomach(pt stating "pain in pancreas" ) Pain Descriptors / Indicators: Cramping;Discomfort;Grimacing;Moaning Pain Intervention(s): Limited activity within patient's tolerance;Monitored during session;Repositioned;Patient requesting pain meds-RN notified    Home Living                      Prior Function            PT Goals (current goals can now be found in the care plan section) Acute Rehab PT Goals Patient Stated Goal: to get stronger PT Goal Formulation: With patient Time For Goal Achievement: 06/18/18 Potential to Achieve Goals: Fair Progress towards PT goals: Progressing toward goals    Frequency    Min 3X/week      PT Plan Current plan remains appropriate    Co-evaluation              AM-PAC PT "6 Clicks" Daily Activity  Outcome Measure  Difficulty turning over in bed (including adjusting bedclothes, sheets and blankets)?: A Little Difficulty moving from lying on back to sitting on the side of the bed? : Unable Difficulty sitting down on and standing up from a chair with arms (e.g., wheelchair, bedside commode, etc,.)?: Unable Help needed moving to and from a bed to chair (including a wheelchair)?: A Little Help needed walking in hospital room?: A Lot Help needed climbing 3-5 steps with a railing? : Total 6 Click Score: 11    End of Session Equipment Utilized During Treatment: Gait belt;Oxygen Activity Tolerance: Patient limited by pain Patient left: in chair;with call bell/phone within reach;with chair alarm set Nurse Communication: Mobility status;Patient requests pain meds PT Visit Diagnosis: Unsteadiness on feet (R26.81);Other abnormalities of gait and mobility (R26.89);Muscle weakness (generalized) (M62.81);Pain;Difficulty in walking, not elsewhere classified (R26.2) Pain - part of body: (abdomen region)     Time: 0923-3007 PT Time  Calculation (min) (ACUTE ONLY): 37 min  Charges:  $Gait Training: 8-22 mins $Therapeutic Exercise: 8-22 mins                     Einar Crow, [place]  Student Physical Therapist Acute Rehab [phone]    Einar Crow 06/12/2018, 12:26 PM

## 2018-06-12 NOTE — Progress Notes (Signed)
Pt has been on bear hugger on and off all night long. Pt keeps complaining of he is freezing cold. Call light within reach. Will continue to monitor.

## 2018-06-12 NOTE — Progress Notes (Addendum)
[facility] TEAM 1 - Stepdown/ICU TEAM  [name]  ZOX:[id number] DOB: 12-Apr-1956 DOA: 06/09/2018 PCP: Harvie Junior, MD    Brief Narrative:  62 y.o.malewith a hx ofHTN, COPD, GERD, depression, anemia, chronic pancreatitis, tonsillar cancer (radiation therapy in remission), tobacco abuse, cocaine abuse, and GERD who presented with altered mental status, cough, shortness of breath and abdominal pain.  Subjective: Reports that he is feeling better in general.  Reports no appetite.  Denies sob, n/v, or abdom pain.    Assessment & Plan:  Sepsis with right lower lobe pneumonia - likely aspiration severely cachectic with very poor and weak cough reflex - SLP following - currently on a dysphagia 1 diet - has been hypothermic and hypoglycemic - appears to be stabilizing   Chronic pancreatitis with severe cachexia, severe protein calorie malnutrition - chronic abdom pain continue pancreatic enzyme supplementation and protein supplementation - outpatient GI follow-up  Transaminitis  Unclear etiology - ?shock liver - follow trend - check hepatitis panel   GERD Cont PPI and Carafate   Cocaine abuse   Ongoing smoking Counseled to quit - NicoDerm patch  Severe persistent Hypomagnesemia Due to poor nutrition/intake - cont to supplement and follow   COPD Stable   DVT prophylaxis: lovenox  Code Status: FULL CODE Family Communication: no family present at time of exam  Disposition Plan: suspect placement will be indicated - cont PT/OT   Consultants:  none  Antimicrobials:  Unasyn 9/8 > Rocephin 9/6 > 9/7  Objective: Blood pressure 125/81, pulse 93, temperature 98 F (36.7 C), temperature source Oral, resp. rate 13, height 5\' 5"  (1.651 m), weight 39.9 kg, SpO2 99 %.  Intake/Output Summary (Last 24 hours) at 06/12/2018 1425 Last data filed at 06/12/2018 1105 Gross per 24 hour  Intake 393.21 ml  Output 3800 ml  Net -3406.79 ml   Filed Weights   06/09/18 2245    Weight: 39.9 kg    Examination: General: No acute respiratory distress at rest in bed  Lungs: bibasilar crackles - no wheezing  Cardiovascular: RRR - no M or rub  Abdomen: Nontender, very thin, soft, bowel sounds positive, no rebound, no ascites Extremities: No significant cyanosis, clubbing, or edema bilateral lower extremities  CBC: Recent Labs  Lab 06/09/18 1846  06/10/18 0729 06/11/18 0418 06/12/18 0538  WBC 2.4*  --  3.3* 3.9* 3.5*  NEUTROABS 2.1  --   --   --   --   HGB 9.3*   < > 8.8* 8.4* 10.3*  HCT 28.6*   < > 25.7* 25.3* 30.0*  MCV 103.6*  --  101.2* 100.8* 100.0  PLT 137*  --  104* 120* 104*   < > = values in this interval not displayed.   Basic Metabolic Panel: Recent Labs  Lab 06/09/18 1846 06/09/18 1855 06/10/18 0729 06/11/18 0418 06/12/18 0538  NA 133* 132*  --  135 138  K 3.7 3.9  --  2.8* 4.3  CL 97* 94*  --  99 95*  CO2 28  --   --  29 31  GLUCOSE 238* 234*  --  74 134*  BUN 34* 43*  --  12 9  CREATININE 0.65 0.70  --  0.45* 0.36*  CALCIUM 7.6*  --   --  7.5* 8.1*  MG  --   --  1.3* 1.5* 1.3*   GFR: Estimated Creatinine Clearance: 54 mL/min (A) (by C-G formula based on SCr of 0.36 mg/dL (L)).  Liver Function Tests: Recent Labs  Lab 06/09/18 1846 06/11/18 0418 06/12/18 0538  AST 498* 188* 133*  ALT 158* 118* 101*  ALKPHOS 221* 156* 151*  BILITOT 0.9 0.5 0.6  PROT 5.6* 5.1* 5.8*  ALBUMIN 2.3* 2.0* 2.2*   Recent Labs  Lab 06/09/18 1846  LIPASE 20    HbA1C: Hgb A1c MFr Bld  Date/Time Value Ref Range Status  02/11/2018 12:44 AM 6.2 (H) 4.8 - 5.6 % Final    Comment:    (NOTE) Pre diabetes:          5.7%-6.4% Diabetes:              >6.4% Glycemic control for   <7.0% adults with diabetes     CBG: Recent Labs  Lab 06/11/18 0735 06/11/18 1118 06/12/18 0011 06/12/18 0637 06/12/18 1206  GLUCAP 97 99 158* 155* 119*    Recent Results (from the past 240 hour(s))  Blood culture (routine x 2)     Status: None (Preliminary  result)   Collection Time: 06/09/18  5:20 PM  Result Value Ref Range Status   Specimen Description BLOOD BLOOD LEFT WRIST  Final   Special Requests   Final    BOTTLES DRAWN AEROBIC ONLY Blood Culture results may not be optimal due to an inadequate volume of blood received in culture bottles   Culture   Final    NO GROWTH 3 DAYS Performed at [facility], [place] [address] [place], [place] [address]    Report Status PENDING  Incomplete  Blood culture (routine x 2)     Status: None (Preliminary result)   Collection Time: 06/09/18  5:25 PM  Result Value Ref Range Status   Specimen Description BLOOD RIGHT ANTECUBITAL  Final   Special Requests   Final    BOTTLES DRAWN AEROBIC AND ANAEROBIC Blood Culture adequate volume   Culture   Final    NO GROWTH 3 DAYS Performed at [facility], [place] [address], [place], [place] [address]    Report Status PENDING  Incomplete  Respiratory Panel by PCR     Status: None   Collection Time: 06/10/18 12:15 AM  Result Value Ref Range Status   Adenovirus NOT DETECTED NOT DETECTED Final   Coronavirus 229E NOT DETECTED NOT DETECTED Final   Coronavirus HKU1 NOT DETECTED NOT DETECTED Final   Coronavirus NL63 NOT DETECTED NOT DETECTED Final   Coronavirus OC43 NOT DETECTED NOT DETECTED Final   Metapneumovirus NOT DETECTED NOT DETECTED Final   Rhinovirus / Enterovirus NOT DETECTED NOT DETECTED Final   Influenza A NOT DETECTED NOT DETECTED Final   Influenza B NOT DETECTED NOT DETECTED Final   Parainfluenza Virus 1 NOT DETECTED NOT DETECTED Final   Parainfluenza Virus 2 NOT DETECTED NOT DETECTED Final   Parainfluenza Virus 3 NOT DETECTED NOT DETECTED Final   Parainfluenza Virus 4 NOT DETECTED NOT DETECTED Final   Respiratory Syncytial Virus NOT DETECTED NOT DETECTED Final   Bordetella pertussis NOT DETECTED NOT DETECTED Final   Chlamydophila pneumoniae NOT DETECTED NOT DETECTED Final   Mycoplasma pneumoniae NOT DETECTED NOT DETECTED Final    Comment:  Performed at [facility], [place] [address], [place], [place] [address]     Scheduled Meds: . Chlorhexidine Gluconate Cloth  6 each Topical Q0600  . enoxaparin (LOVENOX) injection  40 mg Subcutaneous Q24H  . feeding supplement (ENSURE ENLIVE)  474 mL Oral TID BM  . feeding supplement (PRO-STAT SUGAR FREE 64)  30 mL Oral TID WC  .  folic acid  1 mg Oral Daily  . lipase/protease/amylase  12,000 Units Oral TID WC & HS  . mirtazapine  30 mg Oral QHS  . multivitamin with minerals  1 tablet Oral Daily  . mupirocin ointment  1 application Nasal BID  . nicotine  21 mg Transdermal Daily  . pantoprazole  20 mg Oral BID  . sucralfate  1 g Oral TID WC & HS   Continuous Infusions: . ampicillin-sulbactam (UNASYN) IV 3 g (06/12/18 1219)     LOS: 3 days   Cherene Altes, MD [facility] Office  [phone]  Pager - Text Page per Shea Evans  If 7PM-7AM, please contact night-coverage per Amion 06/12/2018, 2:25 PM

## 2018-06-12 NOTE — Progress Notes (Addendum)
Nutrition Consult/Follow Up  DOCUMENTATION CODES:   Severe malnutrition in context of chronic illness, Underweight  INTERVENTION:    Ensure Enlive po 2 cans (474 ml) QID, each supplement provides 350 kcal and 20 grams of protein  Magic cup TID with meals, each supplement provides 290 kcal and 9 grams of protein  MVI po daily  NUTRITION DIAGNOSIS:   Severe Malnutrition related to chronic illness(COPD) as evidenced by severe fat depletion, severe muscle depletion, ongoing  GOAL:   Patient will meet greater than or equal to 90% of their needs, unmet  MONITOR:   PO intake, Supplement acceptance, Labs, Skin  ASSESSMENT:   62 yo male with PMH of HTN, COPD, GERD, anemia, chronic pancreatitis, tonsillar cancer, tobacco & cocaine abuse, who was admitted on 9/6 with AMS, SOB, hypoglycemia.  9/7 initial nutrition assessment, s/p MBSS  RD briefly spoke with pt at bedside. He states "I'm on the pot". Pt reports a poor appetite. PO intake 10-25% per flowsheet records. Ensure Enlive and Prostat liquid protein supplements ordered. Labs & medications reviewed. Mg 1.3 (L). CBG's [id number].  Diet Order:   Diet Order            DIET - DYS 1 Room service appropriate? Yes; Fluid consistency: Honey Thick  Diet effective now             EDUCATION NEEDS:   No education needs have been identified at this time  Skin:  Skin Assessment: Skin Integrity Issues: Skin Integrity Issues:: Stage II, DTI DTI: R & L heel Stage II: sacrum  Last BM:  9/8  Height:   Ht Readings from Last 1 Encounters:  06/09/18 5\' 5"  (1.651 m)   Weight:   Wt Readings from Last 1 Encounters:  06/09/18 39.9 kg   Ideal Body Weight:  61.8 kg  BMI:  Body mass index is 14.64 kg/m.  Estimated Nutritional Needs:   Kcal:  1500-1700  Protein:  60-80 gm  Fluid:  1.5 L  Arthur Holms, RD, LDN Pager #: [phone] After-Hours Pager #: [phone]

## 2018-06-13 LAB — COMPREHENSIVE METABOLIC PANEL
ALBUMIN: 2.3 g/dL — AB (ref 3.5–5.0)
ALT: 103 U/L — AB (ref 0–44)
AST: 139 U/L — AB (ref 15–41)
Alkaline Phosphatase: 165 U/L — ABNORMAL HIGH (ref 38–126)
Anion gap: 7 (ref 5–15)
BUN: 9 mg/dL (ref 8–23)
CHLORIDE: 90 mmol/L — AB (ref 98–111)
CO2: 37 mmol/L — AB (ref 22–32)
CREATININE: 0.34 mg/dL — AB (ref 0.61–1.24)
Calcium: 7.9 mg/dL — ABNORMAL LOW (ref 8.9–10.3)
GFR calc Af Amer: >60 mL/min (ref >60)
GLUCOSE: 73 mg/dL (ref 70–99)
POTASSIUM: 3.2 mmol/L — AB (ref 3.5–5.1)
SODIUM: 134 mmol/L — AB (ref 135–145)
Total Bilirubin: 0.5 mg/dL (ref 0.3–1.2)
Total Protein: 5.9 g/dL — ABNORMAL LOW (ref 6.5–8.1)

## 2018-06-13 LAB — PHOSPHORUS: Phosphorus: <1 mg/dL — CL (ref 2.5–4.6)

## 2018-06-13 LAB — GLUCOSE, CAPILLARY
GLUCOSE-CAPILLARY: 134 mg/dL — AB (ref 70–99)
Glucose-Capillary: 144 mg/dL — ABNORMAL HIGH (ref 70–99)

## 2018-06-13 LAB — CBC
HCT: 26.8 % — ABNORMAL LOW (ref 39.0–52.0)
Hemoglobin: 9.1 g/dL — ABNORMAL LOW (ref 13.0–17.0)
MCH: 34.2 pg — ABNORMAL HIGH (ref 26.0–34.0)
MCHC: 34 g/dL (ref 30.0–36.0)
MCV: 100.8 fL — ABNORMAL HIGH (ref 78.0–100.0)
PLATELETS: 135 10*3/uL — AB (ref 150–400)
RBC: 2.66 MIL/uL — AB (ref 4.22–5.81)
RDW: 14.9 % (ref 11.5–15.5)
WBC: 2.6 10*3/uL — AB (ref 4.0–10.5)

## 2018-06-13 LAB — MRSA PCR SCREENING: MRSA BY PCR: NEGATIVE

## 2018-06-13 LAB — MAGNESIUM: MAGNESIUM: 1.7 mg/dL (ref 1.7–2.4)

## 2018-06-13 MED ORDER — POTASSIUM PHOSPHATES 15 MMOLE/5ML IV SOLN
30.0000 mmol | Freq: Once | INTRAVENOUS | Status: AC
  Administered 2018-06-13: 30 mmol via INTRAVENOUS
  Filled 2018-06-13: qty 10

## 2018-06-13 MED ORDER — IBUPROFEN 200 MG PO TABS
200.0000 mg | ORAL_TABLET | Freq: Four times a day (QID) | ORAL | Status: DC | PRN
  Administered 2018-06-17 – 2018-06-19 (×2): 400 mg via ORAL
  Filled 2018-06-13 (×2): qty 2

## 2018-06-13 MED ORDER — OXYCODONE HCL 5 MG PO TABS
15.0000 mg | ORAL_TABLET | ORAL | Status: DC | PRN
  Administered 2018-06-13 – 2018-06-17 (×19): 15 mg via ORAL
  Filled 2018-06-13 (×19): qty 3

## 2018-06-13 MED ORDER — MAGNESIUM SULFATE 4 GM/100ML IV SOLN
4.0000 g | Freq: Once | INTRAVENOUS | Status: AC
  Administered 2018-06-13: 4 g via INTRAVENOUS
  Filled 2018-06-13: qty 100

## 2018-06-13 MED ORDER — TRAMADOL HCL 50 MG PO TABS
50.0000 mg | ORAL_TABLET | Freq: Four times a day (QID) | ORAL | Status: DC | PRN
  Administered 2018-06-17: 50 mg via ORAL
  Filled 2018-06-13 (×2): qty 1

## 2018-06-13 NOTE — Progress Notes (Signed)
  Speech Language Pathology Treatment: Dysphagia  Patient Details Name: Russell Williamson MRN: [id number] DOB: [birth year] Today's Date: 06/13/2018 Time: 3888-7579 SLP Time Calculation (min) (ACUTE ONLY): 28 min  Assessment / Plan / Recommendation Clinical Impression  Pt recalled one swallow strategy without cues and referred to written reminders in pt's view on wall. He is at significant aspiration risk due to presently weakened state, cachexia, appearance of sluggish/weak swallow and dysphagia hx. Delayed cough following honey thick teaspoon trials with (that was no in conjunction with compensatory strategy). Suspected pharyngeal residue due to weak appearance of laryngeal elevation (subjective observation only). Education with RN re: doubling amount of thickener if filling the 8 oz cup to top. Pt going home and can make the decision to consume thin liquids however he will likely have repeated admissions for pna (not under Palliative Care, pt full code and per notes, family has not been receptive to DNR per prior notes). Will continue education while in acute care. May benefit from home health ST to reiterate recommendation.    HPI HPI: Russell Williamson a 62 y.o.malewith medical history significant ofhypertension, COPD, GERD, depression, anemia, chronic pancreatitis, tonsillar cancer (radiation therapy in remission), tobacco abuse, cocaine abuse, GERD, depression admitted with sepsis with right lower lobe pneumonia, hypothermia and hypoglycemia. Known to ST from prior admissions, with severe, chronic pharyngoesophageal dysphagia. Per MBS 02/15/18, dys 1, honey by teaspoon was recommended.       SLP Plan  Continue with current plan of care       Recommendations  Diet recommendations: Dysphagia 1 (puree);Honey-thick liquid Liquids provided via: Teaspoon Medication Administration: Crushed with puree Supervision: Patient able to self feed;Intermittent supervision to cue for compensatory  strategies Compensations: Slow rate;Small sips/bites;Multiple dry swallows after each bite/sip;Hard cough after swallow;Effortful swallow Postural Changes and/or Swallow Maneuvers: Seated upright 90 degrees                Oral Care Recommendations: Oral care BID;Oral care before and after PO Follow up Recommendations: Home health SLP SLP Visit Diagnosis: Dysphagia, pharyngoesophageal phase (R13.14) Plan: Continue with current plan of care       GO                Russell Williamson 06/13/2018, 3:03 PM  Russell Williamson Russell Williamson.Ed Risk analyst [phone] Office [phone]

## 2018-06-13 NOTE — Care Management Note (Signed)
Case Management Note Marvetta Gibbons RN, BSN Unit 4E- RN Care Coordinator  [phone]  Patient Details  Name: Russell Williamson MRN: [id number] Date of Birth: [birth year]  Subjective/Objective:  Pt admitted with sepsis RLL pna.                  Action/Plan: PTA pt lived at home with family, has support person - niece-Russell Williamson- contact- [phone]. Per notes from last admission pt was set up with [facility] for [facility] services however per [facility] pt was never opened for services due to [facility] unable to contact pt or niece. [facility] has attempted to engage pt in community- however pt does not follow with assigned PCP on Medicaid card of [facility] [employer] and instead sees Max Sane, MD- (located on [facility]?) Per PT eval recommendations for [employer]- CSW has spoken with pt who states he wants to return home and will think about [facility] services- CM will f/u with pt on transition needs for [facility] if pt is agreeable to services.    Expected Discharge Date:                  Expected Discharge Plan:     In-House Referral:     Discharge planning Services     Post Acute Care Choice:    Choice offered to:     DME Arranged:    DME Agency:     HH Arranged:    HH Agency:     Status of Service:     If discussed at [place] of Stay Meetings, dates discussed:    Discharge Disposition:   Additional Comments:  Dawayne Patricia, RN 06/13/2018, 3:36 PM

## 2018-06-13 NOTE — Progress Notes (Signed)
Paged [facility] about critical phosphorus lab less than 1.0. Awaiting orders.   Fransico Michael, RN

## 2018-06-13 NOTE — Clinical Social Work Note (Signed)
Clinical Social Work Assessment  Patient Details  Name: Russell Williamson MRN: [id number] Date of Birth: 1955/10/27  Date of referral:  06/13/18               Reason for consult:  Discharge Planning, Facility Placement                Permission sought to share information with:  Family Supports Permission granted to share information::     Name::     Roanna Raider, [phone]  Agency::     Relationship::  niece  Contact Information:  [phone]  Housing/Transportation Living arrangements for the past 2 months:  Single Family Home Source of Information:  Patient Patient Interpreter Needed:  None Criminal Activity/Legal Involvement Pertinent to Current Situation/Hospitalization:  No - Comment as needed Significant Relationships:  Siblings, Other Family Members Lives with:  Relatives Do you feel safe going back to the place where you live?  Yes Need for family participation in patient care:  Yes (Comment)  Care giving concerns:  No family at bedside. patient stated he lives at home with his niece and 2 great nieces. Patient stated that his niece went back to school so she is unable to provide him 24 hours care at home.   Social Worker assessment / plan:  Patient stated he receives support from his niece and siblings in the area. CSW spoke with patient about going to rehab. Patient stated he is not interested in going to a SNF. Patient stated he receives disability and patient stated if he goes to SNF his money would be taken away by the facility. CSW ask patient if he would be interested in receiving home health. Patient stated he will think about it and let CSW know.  Employment status:  Disabled (Comment on whether or not currently receiving Disability) Insurance information:  Medicaid In [place] PT Recommendations:  [place] / Referral to community resources:  [place]  Patient/Family's Response to care:  Patient appreciate CSW role in  care  Patient/Family's Understanding of and Emotional Response to Diagnosis, Current Treatment, and Prognosis:  Patient is refusing SNF.  Emotional Assessment Appearance:  Appears older than stated age Attitude/Demeanor/Rapport:  Engaged Affect (typically observed):  Accepting Orientation:  Oriented to Self, Oriented to Situation, Oriented to Place, Oriented to  Time Alcohol / Substance use:  Tobacco Use, Illicit Drugs(pt no longer uses) Psych involvement (Current and /or in the community):  No (Comment)  Discharge Needs  Concerns to be addressed:  Care Coordination Readmission within the last 30 days:  No Current discharge risk:  Dependent with Mobility Barriers to Discharge:  Continued Medical Work up   [employer], LCSW 06/13/2018, 3:04 PM

## 2018-06-13 NOTE — Progress Notes (Signed)
[facility] TEAM 1 - Stepdown/ICU TEAM  Russell Williamson  LOV:[id number] DOB: 05-04-1956 DOA: 06/09/2018 PCP: Harvie Junior, MD    Brief Narrative:  62 y.o.malewith a hx ofHTN, COPD, GERD, depression, anemia, chronic pancreatitis, tonsillar cancer (radiation therapy in remission), tobacco abuse, cocaine abuse, and GERD who presented with altered mental status, cough, shortness of breath and abdominal pain.  Subjective: C/o severe uncontrolled pain in his neck.  Asks for his usual home narcotic regimen.  Reports poor appetite but understands he needs to make himself eat.  No HA, n/v.  Usual ongoing abdom pain.    Assessment & Plan:  Sepsis with right lower lobe pneumonia - likely aspiration severely cachectic with very poor and weak cough reflex - SLP following - currently on a dysphagia 1 diet - has been hypothermic and hypoglycemic - continues to require a heating blanket   Chronic pancreatitis with severe cachexia, severe protein calorie malnutrition - chronic abdom pain continue pancreatic enzyme supplementation and protein supplementation - outpatient GI follow-up - Nutrition assisting w/ support   Transaminitis  Unclear etiology - ?shock liver - follow trend - viral hepatitis panel negative - appears to be slowly improving   Recent Labs  Lab 06/09/18 1846 06/11/18 0418 06/12/18 0538 06/13/18 0440  AST 498* 188* 133* 139*  ALT 158* 118* 101* 103*  ALKPHOS 221* 156* 151* 165*  BILITOT 0.9 0.5 0.6 0.5  PROT 5.6* 5.1* 5.8* 5.9*  ALBUMIN 2.3* 2.0* 2.2* 2.3*    GERD Cont PPI and Carafate   Cocaine abuse  Cont to counsel on need for abstinence   Ongoing smoking Counseled to quit - NicoDerm patch  Severe persistent Hypomagnesemia Due to poor nutrition/intake - cont to supplement and follow   Severe hypophosphatemia  Phos <1 - replace and follow - due to severe malnutrition   COPD Stable   DVT prophylaxis: lovenox  Code Status: FULL CODE Family  Communication: no family present at time of exam  Disposition Plan: suspect placement will be indicated - cont PT/OT as tolerated   Consultants:  none  Antimicrobials:  Unasyn 9/8 > Rocephin 9/6 > 9/7  Objective: Blood pressure 115/78, pulse 78, temperature 97.7 F (36.5 C), temperature source Oral, resp. rate 11, height 5\' 5"  (1.651 m), weight 39.9 kg, SpO2 100 %.  Intake/Output Summary (Last 24 hours) at 06/13/2018 1144 Last data filed at 06/12/2018 2200 Gross per 24 hour  Intake 155.57 ml  Output -  Net 155.57 ml   Filed Weights   06/09/18 2245  Weight: 39.9 kg    Examination: General: No acute respiratory distress while at rest Lungs: bibasilar crackles less pronounced - no wheezing  Cardiovascular: RRR - no M or rub  Abdomen: Nontender, very thin, soft, bowel sounds positive, no rebound, no ascites Extremities: No signif edema B LE   CBC: Recent Labs  Lab 06/09/18 1846  06/11/18 0418 06/12/18 0538 06/13/18 0440  WBC 2.4*   < > 3.9* 3.5* 2.6*  NEUTROABS 2.1  --   --   --   --   HGB 9.3*   < > 8.4* 10.3* 9.1*  HCT 28.6*   < > 25.3* 30.0* 26.8*  MCV 103.6*   < > 100.8* 100.0 100.8*  PLT 137*   < > 120* 104* 135*   < > = values in this interval not displayed.   Basic Metabolic Panel: Recent Labs  Lab 06/11/18 0418 06/12/18 0538 06/13/18 0440  NA 135 138 134*  K 2.8* 4.3 3.2*  CL 99 95* 90*  CO2 29 31 37*  GLUCOSE 74 134* 73  BUN 12 9 9   CREATININE 0.45* 0.36* 0.34*  CALCIUM 7.5* 8.1* 7.9*  MG 1.5* 1.3* 1.7  PHOS  --   --  <1.0*   GFR: Estimated Creatinine Clearance: 54 mL/min (A) (by C-G formula based on SCr of 0.34 mg/dL (L)).  Liver Function Tests: Recent Labs  Lab 06/09/18 1846 06/11/18 0418 06/12/18 0538 06/13/18 0440  AST 498* 188* 133* 139*  ALT 158* 118* 101* 103*  ALKPHOS 221* 156* 151* 165*  BILITOT 0.9 0.5 0.6 0.5  PROT 5.6* 5.1* 5.8* 5.9*  ALBUMIN 2.3* 2.0* 2.2* 2.3*   Recent Labs  Lab 06/09/18 1846  LIPASE 20     HbA1C: Hgb A1c MFr Bld  Date/Time Value Ref Range Status  02/11/2018 12:44 AM 6.2 (H) 4.8 - 5.6 % Final    Comment:    (NOTE) Pre diabetes:          5.7%-6.4% Diabetes:              >6.4% Glycemic control for   <7.0% adults with diabetes     CBG: Recent Labs  Lab 06/11/18 1118 06/12/18 0011 06/12/18 0637 06/12/18 1206 06/13/18 0006  GLUCAP 99 158* 155* 119* 144*    Recent Results (from the past 240 hour(s))  Blood culture (routine x 2)     Status: None (Preliminary result)   Collection Time: 06/09/18  5:20 PM  Result Value Ref Range Status   Specimen Description BLOOD BLOOD LEFT WRIST  Final   Special Requests   Final    BOTTLES DRAWN AEROBIC ONLY Blood Culture results may not be optimal due to an inadequate volume of blood received in culture bottles   Culture   Final    NO GROWTH 4 DAYS Performed at [facility], [place] [address] [place], [place] [address]    Report Status PENDING  Incomplete  Blood culture (routine x 2)     Status: None (Preliminary result)   Collection Time: 06/09/18  5:25 PM  Result Value Ref Range Status   Specimen Description BLOOD RIGHT ANTECUBITAL  Final   Special Requests   Final    BOTTLES DRAWN AEROBIC AND ANAEROBIC Blood Culture adequate volume   Culture   Final    NO GROWTH 4 DAYS Performed at [facility], [place] [address] [place], [place] [address]    Report Status PENDING  Incomplete  Respiratory Panel by PCR     Status: None   Collection Time: 06/10/18 12:15 AM  Result Value Ref Range Status   Adenovirus NOT DETECTED NOT DETECTED Final   Coronavirus 229E NOT DETECTED NOT DETECTED Final   Coronavirus HKU1 NOT DETECTED NOT DETECTED Final   Coronavirus NL63 NOT DETECTED NOT DETECTED Final   Coronavirus OC43 NOT DETECTED NOT DETECTED Final   Metapneumovirus NOT DETECTED NOT DETECTED Final   Rhinovirus / Enterovirus NOT DETECTED NOT DETECTED Final   Influenza A NOT DETECTED NOT DETECTED Final   Influenza B NOT  DETECTED NOT DETECTED Final   Parainfluenza Virus 1 NOT DETECTED NOT DETECTED Final   Parainfluenza Virus 2 NOT DETECTED NOT DETECTED Final   Parainfluenza Virus 3 NOT DETECTED NOT DETECTED Final   Parainfluenza Virus 4 NOT DETECTED NOT DETECTED Final   Respiratory Syncytial Virus NOT DETECTED NOT DETECTED Final   Bordetella pertussis NOT DETECTED NOT DETECTED Final   Chlamydophila pneumoniae NOT DETECTED NOT DETECTED Final  Mycoplasma pneumoniae NOT DETECTED NOT DETECTED Final    Comment: Performed at [facility], [place] [address], [place], [place] [address]     Scheduled Meds: . Chlorhexidine Gluconate Cloth  6 each Topical Q0600  . enoxaparin (LOVENOX) injection  40 mg Subcutaneous Q24H  . feeding supplement (ENSURE ENLIVE)  474 mL Oral TID BM  . feeding supplement (PRO-STAT SUGAR FREE 64)  30 mL Oral TID WC  . folic acid  1 mg Oral Daily  . lipase/protease/amylase  12,000 Units Oral TID WC & HS  . mirtazapine  30 mg Oral QHS  . multivitamin with minerals  1 tablet Oral Daily  . mupirocin ointment  1 application Nasal BID  . nicotine  21 mg Transdermal Daily  . pantoprazole  20 mg Oral BID  . sucralfate  1 g Oral TID WC & HS   Continuous Infusions: . ampicillin-sulbactam (UNASYN) IV 3 g (06/13/18 0528)     LOS: 4 days   Cherene Altes, MD [facility] Office  [phone]  Pager - Text Page per Shea Evans  If 7PM-7AM, please contact night-coverage per Amion 06/13/2018, 11:44 AM

## 2018-06-13 NOTE — Progress Notes (Signed)
PT Cancellation Note  Patient Details Name: Russell Williamson MRN: [id number] DOB: 04-15-1956   Cancelled Treatment:    Reason Eval/Treat Not Completed: Patient declined, no reason specified   [place] 06/13/2018, 2:51 PM [place] Pager [phone] Office [phone]

## 2018-06-14 LAB — GLUCOSE, CAPILLARY
GLUCOSE-CAPILLARY: 76 mg/dL (ref 70–99)
Glucose-Capillary: 94 mg/dL (ref 70–99)
Glucose-Capillary: 59 mg/dL — ABNORMAL LOW (ref 70–99)
Glucose-Capillary: 70 mg/dL (ref 70–99)

## 2018-06-14 LAB — COMPREHENSIVE METABOLIC PANEL
ALBUMIN: 2.3 g/dL — AB (ref 3.5–5.0)
ALT: 89 U/L — AB (ref 0–44)
AST: 103 U/L — ABNORMAL HIGH (ref 15–41)
Alkaline Phosphatase: 155 U/L — ABNORMAL HIGH (ref 38–126)
Anion gap: 9 (ref 5–15)
BUN: 12 mg/dL (ref 8–23)
CHLORIDE: 91 mmol/L — AB (ref 98–111)
CO2: 33 mmol/L — AB (ref 22–32)
Calcium: 7.8 mg/dL — ABNORMAL LOW (ref 8.9–10.3)
Creatinine, Ser: 0.36 mg/dL — ABNORMAL LOW (ref 0.61–1.24)
GFR calc Af Amer: >60 mL/min (ref >60)
GFR calc non Af Amer: >60 mL/min (ref >60)
GLUCOSE: 98 mg/dL (ref 70–99)
Potassium: 3.8 mmol/L (ref 3.5–5.1)
SODIUM: 133 mmol/L — AB (ref 135–145)
Total Bilirubin: 0.6 mg/dL (ref 0.3–1.2)
Total Protein: 6.3 g/dL — ABNORMAL LOW (ref 6.5–8.1)

## 2018-06-14 LAB — CULTURE, BLOOD (ROUTINE X 2)
Culture: NO GROWTH
Culture: NO GROWTH
Special Requests: ADEQUATE

## 2018-06-14 LAB — CBC
HCT: 32 % — ABNORMAL LOW (ref 39.0–52.0)
HEMOGLOBIN: 10.5 g/dL — AB (ref 13.0–17.0)
MCH: 33.9 pg (ref 26.0–34.0)
MCHC: 32.8 g/dL (ref 30.0–36.0)
MCV: 103.2 fL — AB (ref 78.0–100.0)
Platelets: 105 10*3/uL — ABNORMAL LOW (ref 150–400)
RBC: 3.1 MIL/uL — AB (ref 4.22–5.81)
RDW: 14.9 % (ref 11.5–15.5)
WBC: 3.6 10*3/uL — ABNORMAL LOW (ref 4.0–10.5)

## 2018-06-14 LAB — T4, FREE: Free T4: 0.71 ng/dL — ABNORMAL LOW (ref 0.82–1.77)

## 2018-06-14 LAB — TSH: TSH: 21.208 u[IU]/mL — ABNORMAL HIGH (ref 0.350–4.500)

## 2018-06-14 LAB — PROTIME-INR
INR: 1.24
PROTHROMBIN TIME: 15.5 s — AB (ref 11.4–15.2)

## 2018-06-14 LAB — PHOSPHORUS: Phosphorus: 2.4 mg/dL — ABNORMAL LOW (ref 2.5–4.6)

## 2018-06-14 LAB — MAGNESIUM: Magnesium: 1.9 mg/dL (ref 1.7–2.4)

## 2018-06-14 MED ORDER — LORAZEPAM 2 MG/ML IJ SOLN
0.2500 mg | Freq: Once | INTRAMUSCULAR | Status: AC
  Administered 2018-06-14: 0.25 mg via INTRAVENOUS
  Filled 2018-06-14: qty 1

## 2018-06-14 MED ORDER — LEVOTHYROXINE SODIUM 100 MCG IV SOLR
50.0000 ug | Freq: Every day | INTRAVENOUS | Status: DC
  Administered 2018-06-14 – 2018-06-20 (×7): 50 ug via INTRAVENOUS
  Filled 2018-06-14 (×7): qty 5

## 2018-06-14 MED ORDER — K PHOS MONO-SOD PHOS DI & MONO 155-852-130 MG PO TABS
500.0000 mg | ORAL_TABLET | Freq: Three times a day (TID) | ORAL | Status: DC
  Administered 2018-06-14 – 2018-06-20 (×22): 500 mg via ORAL
  Filled 2018-06-14 (×29): qty 2

## 2018-06-14 MED ORDER — MEGESTROL ACETATE 40 MG PO TABS
40.0000 mg | ORAL_TABLET | Freq: Every day | ORAL | Status: DC
  Administered 2018-06-14 – 2018-06-15 (×2): 40 mg via ORAL
  Filled 2018-06-14 (×2): qty 1

## 2018-06-14 NOTE — Progress Notes (Signed)
Physical Therapy Treatment Patient Details Name: Russell Williamson MRN: [id number] DOB: [birth year] Today's Date: 06/14/2018    History of Present Illness Pt is a 62 y.o. male with PMH of HTN, COPD, GERD, depression, anemia, chronic pancreatitis, tonsillar cancer (radiation therapy in remission), tobacco abuse, cocaine abuse, GERD, depression, who presents 06/09/18 with AMS, cough, SOB and abdominal pain. Worked up for sepsis with right lower lobe pneumonia, likely aspiration.   PT Comments    Pt slowly progressing with mobility. Requires minA to stand and maintain balance while ambulating short distance with RW. 1x LOB while performing standing ADL tasks requiring minA to correct. Pt with poor insight into deficits and providing inconsistent details regarding amount of assist family provides. Pt continues to refuse SNF-level therapies, stating family will be able to help at home. If to return home, pt would benefit from [facility] services and equipment listed below.  Supine BP 123/85 Post-mobility BP 98/77 - pt c/o lightheadedness with ambulation (RN notified)  SpO2 96% on 2L O2 at rest, down to 84% on RA, requiring 3L O2 [place] with mobility   Follow Up Recommendations  SNF;Supervision/Assistance - 24 hour(refusing SNF; will need HHPT services)     Equipment Recommendations  Rolling walker with 5" wheels;3in1 (PT)    Recommendations for Other Services       Precautions / Restrictions Precautions Precautions: Fall Precaution Comments: watch BP and O2  Restrictions Weight Bearing Restrictions: No    Mobility  Bed Mobility Overal bed mobility: Needs Assistance Bed Mobility: Supine to Sit     Supine to sit: Supervision;HOB elevated        Transfers Overall transfer level: Needs assistance Equipment used: Rolling walker (2 wheeled) Transfers: Sit to/from Stand Sit to Stand: Min assist         General transfer comment: Stood from bed and toilet with minA; repeated cues for  correct hand placement each trial. SpO2 down to 84% on RA, placed on 3L O2 [place]  Ambulation/Gait Ambulation/Gait assistance: Min assist Gait Distance (Feet): 20 Feet Assistive device: Rolling walker (2 wheeled) Gait Pattern/deviations: Step-to pattern;Scissoring;Narrow base of support;Trunk flexed Gait velocity: Decreased Gait velocity interpretation: <1.8 ft/sec, indicate of risk for recurrent falls General Gait Details: Amb in room with RW and intermittent minA to maintain balance. C/o lightheadedness; BP down to 98/77 post-amb   Stairs             Wheelchair Mobility    Modified Rankin (Stroke Patients Only)       Balance Overall balance assessment: Needs assistance Sitting-balance support: Single extremity supported;Feet supported Sitting balance-Leahy Scale: Fair     Standing balance support: Bilateral upper extremity supported;During functional activity Standing balance-Leahy Scale: Poor Standing balance comment: Pt stood at sink for grooming and UB bathing with with RW and support for balance, unable to safely simuate LB bathing due to Poor dynamic balance                            Cognition Arousal/Alertness: Awake/alert Behavior During Therapy: Flat affect Overall Cognitive Status: No family/caregiver present to determine baseline cognitive functioning                                 General Comments: Decreased awareness of safety, poor insight into deficits. Inconsistent details regarding assist from family at home      Exercises      General Comments  Pertinent Vitals/Pain Pain Assessment: Faces Pain Score: 0-No pain Faces Pain Scale: Hurts a little bit Pain Location: Bilat feet Pain Descriptors / Indicators: Sore Pain Intervention(s): Monitored during session    Home Living Family/patient expects to be discharged to:: Private residence Living Arrangements: Other relatives Available Help at Discharge:  Family;Available PRN/intermittently Type of Home: House Home Access: Stairs to enter   Home Layout: One level Home Equipment: Kasandra Knudsen - single point Additional Comments: Lives with niece and her kids.     Prior Function Level of Independence: Independent with assistive device(s)          PT Goals (current goals can now be found in the care plan section) Acute Rehab PT Goals Patient Stated Goal: go home PT Goal Formulation: With patient Time For Goal Achievement: 06/18/18 Potential to Achieve Goals: Fair Progress towards PT goals: Progressing toward goals    Frequency    Min 3X/week      PT Plan Current plan remains appropriate    Co-evaluation PT/OT/SLP Co-Evaluation/Treatment: Yes Reason for Co-Treatment: For patient/therapist safety;To address functional/ADL transfers PT goals addressed during session: Mobility/safety with mobility;Balance;Proper use of DME OT goals addressed during session: ADL's and self-care;Proper use of Adaptive equipment and DME      AM-PAC PT "6 Clicks" Daily Activity  Outcome Measure  Difficulty turning over in bed (including adjusting bedclothes, sheets and blankets)?: None Difficulty moving from lying on back to sitting on the side of the bed? : A Little Difficulty sitting down on and standing up from a chair with arms (e.g., wheelchair, bedside commode, etc,.)?: Unable Help needed moving to and from a bed to chair (including a wheelchair)?: A Little Help needed walking in hospital room?: A Little Help needed climbing 3-5 steps with a railing? : A Lot 6 Click Score: 16    End of Session Equipment Utilized During Treatment: Gait belt;Oxygen Activity Tolerance: Patient limited by fatigue Patient left: in chair;with call bell/phone within reach;with chair alarm set Nurse Communication: Mobility status PT Visit Diagnosis: Unsteadiness on feet (R26.81);Other abnormalities of gait and mobility (R26.89);Muscle weakness (generalized)  (M62.81);Pain;Difficulty in walking, not elsewhere classified (R26.2)     Time: 1694-5038 PT Time Calculation (min) (ACUTE ONLY): 32 min  Charges:  $Therapeutic Activity: 8-22 mins              Mabeline Caras, PT, DPT Acute Rehabilitation Services  Pager [phone] Office [phone]  Derry Lory 06/14/2018, 12:56 PM

## 2018-06-14 NOTE — Evaluation (Signed)
Occupational Therapy Evaluation Patient Details Name: Russell Williamson MRN: [id number] DOB: 06-09-1956 Today's Date: 06/14/2018    History of Present Illness Russell Williamson is a 62 y.o. male with medical history significant of hypertension, COPD, GERD, depression, anemia, chronic pancreatitis, tonsillar cancer (radiation therapy in remission), tobacco abuse, cocaine abuse, GERD, depression, who presents with altered mental status, cough, shortness of breath and abdominal pain   Clinical Impression   Pt with decline in function and safety with ADLs and ADL mobility with decreased strength, balance and endurance. Pt reports that he will have 24/7 assist form family at home, however uncertain this is accurate as pt is adamant about going home and not ST SNF rehab after acute d/c. Pt with BP of 123/85 upon arrival and dropped to 98/77 after activity ambulating to bathroom and standing at sink for ADLs. Pts' O2 SATs 96-98% at start of session on 2 L O2. Pt dropped to 84% on RA after bed mobility to sit EOB. Pt placed back on O2 and increased to 3 L for remainder of session, RN notified. Pt would benefit from acute OT services to address impairments to maximize level of function and safety     Follow Up Recommendations  Home health OT;Supervision/Assistance - 24 hour (refusing ST SNF rehab)   Equipment Recommendations  3 in 1 bedside commode;Tub/shower seat;Other (comment)(reacher)    Recommendations for Other Services       Precautions / Restrictions Precautions Precautions: Fall Precaution Comments: watch BP and O2 SATs Restrictions Weight Bearing Restrictions: No      Mobility Bed Mobility Overal bed mobility: Needs Assistance Bed Mobility: Supine to Sit     Supine to sit: Supervision;HOB elevated        Transfers Overall transfer level: Needs assistance Equipment used: Rolling walker (2 wheeled) Transfers: Sit to/from [facility] Sit to Stand: Min  assist;Min guard         General transfer comment: unable to participate in mobility on RA, O2 placed back on pt sitting EOB and increased to 3 L after bed mobility sitting EOB with whenO2 SATs dropped to 84%    Balance Overall balance assessment: Needs assistance Sitting-balance support: Single extremity supported;Feet supported Sitting balance-Leahy Scale: Fair     Standing balance support: Bilateral upper extremity supported;During functional activity Standing balance-Leahy Scale: Poor Standing balance comment: Pt stood at sink for grooming and UB bathing with with RW and support for balance, unable to safely simuate LB bathing due to Poor dynamic balance                           ADL either performed or assessed with clinical judgement   ADL Overall ADL's : Needs assistance/impaired Eating/Feeding: Supervision/ safety;Set up;Sitting   Grooming: Wash/dry face;Wash/dry hands;Min guard;Standing   Upper Body Bathing: Min guard;Standing   Lower Body Bathing: Maximal assistance;Sit to/from stand   Upper Body Dressing : Min guard;Standing   Lower Body Dressing: Total assistance   Toilet Transfer: Minimal assistance;Ambulation;RW;Comfort height toilet   Toileting- Clothing Manipulation and Hygiene: Moderate assistance;Sit to/from stand       Functional mobility during ADLs: Minimal assistance;Rolling walker;Cueing for safety       Vision Baseline Vision/History: Wears glasses Wears Glasses: Reading only Patient Visual Report: No change from baseline       Perception     Praxis      Pertinent Vitals/Pain Pain Assessment: No/denies pain Pain Score: 0-No pain Pain Intervention(s): Monitored during session  Hand Dominance Right   Extremity/Trunk Assessment Upper Extremity Assessment Upper Extremity Assessment: Generalized weakness   Lower Extremity Assessment Lower Extremity Assessment: Defer to PT evaluation       Communication  Communication Communication: No difficulties   Cognition Arousal/Alertness: Awake/alert Behavior During Therapy: WFL for tasks assessed/performed Overall Cognitive Status: No family/caregiver present to determine baseline cognitive functioning                                     General Comments       Exercises     Shoulder Instructions      Home Living Family/patient expects to be discharged to:: Private residence Living Arrangements: Other relatives Available Help at Discharge: Family;Available PRN/intermittently Type of Home: House Home Access: Stairs to enter [employer] of Steps: 1   Home Layout: One level     Bathroom Shower/Tub: Teacher, early years/pre: Standard     Home Equipment: Kasandra Knudsen - single point   Additional Comments: Lives with niece and her kids.       Prior Functioning/Environment Level of Independence: Independent with assistive device(s)                 OT Problem List: Decreased strength;Decreased activity tolerance;Decreased knowledge of use of DME or AE;Impaired balance (sitting and/or standing);Decreased coordination      OT Treatment/Interventions: Self-care/ADL training;Energy conservation;Balance training;Therapeutic exercise;DME and/or AE instruction;Neuromuscular education;Therapeutic activities;Patient/family education    OT Goals(Current goals can be found in the care plan section) Acute Rehab OT Goals Patient Stated Goal: go home OT Goal Formulation: With patient Time For Goal Achievement: 06/28/18 Potential to Achieve Goals: Good ADL Goals Pt Will Perform Grooming: with supervision;with set-up;standing Pt Will Perform Upper Body Bathing: with set-up;with supervision;sitting Pt Will Perform Lower Body Bathing: with mod assist;sitting/lateral leans;sit to/from stand;with caregiver independent in assisting Pt Will Perform Upper Body Dressing: with set-up;with supervision;sitting Pt Will  Perform Lower Body Dressing: with mod assist;sitting/lateral leans;sit to/from stand;with caregiver independent in assisting Pt Will Transfer to Toilet: with min guard assist;with supervision;ambulating Pt Will Perform Toileting - Clothing Manipulation and hygiene: with min assist;sit to/from stand Pt Will Perform Tub/Shower Transfer: with min guard assist;with supervision;ambulating;shower seat;grab bars;3 in 1;rolling walker  OT Frequency: Min 2X/week   Barriers to D/C: Decreased caregiver support;Other (comment)  uncertain pt's reports of level of 24/7 assist is accurate, however pt refusing ST SNF rehab       Co-evaluation PT/OT/SLP Co-Evaluation/Treatment: Yes Reason for Co-Treatment: For patient/therapist safety;To address functional/ADL transfers   OT goals addressed during session: ADL's and self-care;Proper use of Adaptive equipment and DME      AM-PAC PT "6 Clicks" Daily Activity     Outcome Measure Help from another person eating meals?: None Help from another person taking care of personal grooming?: A Little Help from another person toileting, which includes using toliet, bedpan, or urinal?: A Lot Help from another person bathing (including washing, rinsing, drying)?: A Lot Help from another person to put on and taking off regular upper body clothing?: A Little Help from another person to put on and taking off regular lower body clothing?: A Lot 6 Click Score: 16   End of Session Equipment Utilized During Treatment: Gait belt;Rolling walker Nurse Communication: Mobility status  Activity Tolerance: Patient tolerated treatment well Patient left: in chair;with call bell/phone within reach;with chair alarm set  OT Visit Diagnosis: Unsteadiness on feet (R26.81);Other  abnormalities of gait and mobility (R26.89);Muscle weakness (generalized) (M62.81)                Time: 1117-3567 OT Time Calculation (min): 33 min Charges:  OT General Charges $OT Visit: 1 Visit OT  Evaluation $OT Eval Moderate Complexity: 1 Mod    Britt Bottom 06/14/2018, 12:04 PM

## 2018-06-14 NOTE — Progress Notes (Signed)
[place] TEAM 1 - Stepdown/ICU TEAM  [name]  VQQ:[id number] DOB: March 19, 1956 DOA: 06/09/2018 PCP: Harvie Junior, MD    Brief Narrative:  62 y.o.malewith a hx ofHTN, COPD, GERD, depression, anemia, chronic pancreatitis, tonsillar cancer (radiation therapy in remission), tobacco abuse, cocaine abuse, and GERD who presented with altered mental status, cough, shortness of breath and abdominal pain.  Subjective: Patient in bed, appears comfortable, denies any headache, no fever, no chest pain or pressure, no shortness of breath , no abdominal pain. No focal weakness.   Assessment & Plan:  Sepsis due to aspiration pneumonia. - severely cachectic with very poor and weak cough reflex - SLP following - currently on a dysphagia 1 diet - has been hypothermic and hypoglycemic - continues to require a heating blanket   Undiagnosed moderate to severe hypothyroidism.  TSH above 25.  Likely quite high for his body weight which is only 75 pounds.  Placed on IV Synthroid and monitor.    Chronic pancreatitis with severe cachexia, severe protein calorie malnutrition - chronic abdom pain, continue pancreatic enzyme supplementation and protein supplementation - outpatient GI follow-up - Nutrition assisting w/ support   Transaminitis - due to shock liver from hypotension improving.  Static function is stable with stable INR.   GERD - Cont PPI and Carafate   Cocaine abuse -  Cont to counsel on need for abstinence   Ongoing smoking - Counseled to quit - NicoDerm patch  Severe persistent Hypomagnesemia and hypophosphatemia. -  Due to poor nutrition/intake - cont to supplement and follow   Chronic COPD.  Stable.      DVT prophylaxis: lovenox  Code Status: FULL CODE Family Communication: no family present at time of exam  Disposition Plan: suspect placement will be indicated - cont PT/OT as tolerated   Consultants:  none  Antimicrobials:  Unasyn 9/8 > Rocephin 9/6 >  9/7  Objective: Blood pressure 115/77, pulse 82, temperature 98 F (36.7 C), temperature source Oral, resp. rate 14, height 5\' 5"  (1.651 m), weight 36.9 kg, SpO2 92 %.  Intake/Output Summary (Last 24 hours) at 06/14/2018 1248 Last data filed at 06/14/2018 0557 Gross per 24 hour  Intake 1165.82 ml  Output 800 ml  Net 365.82 ml   Filed Weights   06/09/18 2245 06/14/18 0510  Weight: 39.9 kg 36.9 kg    Examination:  Severely cachectic African-American male, awake Alert, No new F.N deficits, Normal affect [place].AT,PERRAL Supple Neck,No JVD, No cervical lymphadenopathy appriciated.  Symmetrical Chest wall movement, Good air movement bilaterally, CTAB RRR,No Gallops, Rubs or new Murmurs, No Parasternal Heave +ve B.Sounds, Abd Soft, No tenderness, No organomegaly appriciated, No rebound - guarding or rigidity. No Cyanosis, Clubbing or edema, No new Rash or bruise   CBC: Recent Labs  Lab 06/09/18 1846  06/12/18 0538 06/13/18 0440 06/14/18 0344  WBC 2.4*   < > 3.5* 2.6* 3.6*  NEUTROABS 2.1  --   --   --   --   HGB 9.3*   < > 10.3* 9.1* 10.5*  HCT 28.6*   < > 30.0* 26.8* 32.0*  MCV 103.6*   < > 100.0 100.8* 103.2*  PLT 137*   < > 104* 135* 105*   < > = values in this interval not displayed.   Basic Metabolic Panel: Recent Labs  Lab 06/12/18 0538 06/13/18 0440 06/14/18 0344  NA 138 134* 133*  K 4.3 3.2* 3.8  CL 95* 90* 91*  CO2 31 37* 33*  GLUCOSE 134* 73  98  BUN 9 9 12   CREATININE 0.36* 0.34* 0.36*  CALCIUM 8.1* 7.9* 7.8*  MG 1.3* 1.7 1.9  PHOS  --  <1.0* 2.4*   GFR: Estimated Creatinine Clearance: 50 mL/min (A) (by C-G formula based on SCr of 0.36 mg/dL (L)).  Liver Function Tests: Recent Labs  Lab 06/11/18 0418 06/12/18 0538 06/13/18 0440 06/14/18 0344  AST 188* 133* 139* 103*  ALT 118* 101* 103* 89*  ALKPHOS 156* 151* 165* 155*  BILITOT 0.5 0.6 0.5 0.6  PROT 5.1* 5.8* 5.9* 6.3*  ALBUMIN 2.0* 2.2* 2.3* 2.3*   Recent Labs  Lab 06/09/18 1846  LIPASE 20     HbA1C: Hgb A1c MFr Bld  Date/Time Value Ref Range Status  02/11/2018 12:44 AM 6.2 (H) 4.8 - 5.6 % Final    Comment:    (NOTE) Pre diabetes:          5.7%-6.4% Diabetes:              >6.4% Glycemic control for   <7.0% adults with diabetes     CBG: Recent Labs  Lab 06/13/18 0006 06/13/18 1627 06/14/18 0644 06/14/18 1115 06/14/18 1149  GLUCAP 144* 134* 94 59* 70    Recent Results (from the past 240 hour(s))  Blood culture (routine x 2)     Status: None   Collection Time: 06/09/18  5:20 PM  Result Value Ref Range Status   Specimen Description BLOOD BLOOD LEFT WRIST  Final   Special Requests   Final    BOTTLES DRAWN AEROBIC ONLY Blood Culture results may not be optimal due to an inadequate volume of blood received in culture bottles   Culture   Final    NO GROWTH 5 DAYS Performed at [facility], [place] [address], [place], [place] [address]    Report Status 06/14/2018 FINAL  Final  Blood culture (routine x 2)     Status: None   Collection Time: 06/09/18  5:25 PM  Result Value Ref Range Status   Specimen Description BLOOD RIGHT ANTECUBITAL  Final   Special Requests   Final    BOTTLES DRAWN AEROBIC AND ANAEROBIC Blood Culture adequate volume   Culture   Final    NO GROWTH 5 DAYS Performed at [facility], [place] [address], [place], [place] [address]    Report Status 06/14/2018 FINAL  Final  Respiratory Panel by PCR     Status: None   Collection Time: 06/10/18 12:15 AM  Result Value Ref Range Status   Adenovirus NOT DETECTED NOT DETECTED Final   Coronavirus 229E NOT DETECTED NOT DETECTED Final   Coronavirus HKU1 NOT DETECTED NOT DETECTED Final   Coronavirus NL63 NOT DETECTED NOT DETECTED Final   Coronavirus OC43 NOT DETECTED NOT DETECTED Final   Metapneumovirus NOT DETECTED NOT DETECTED Final   Rhinovirus / Enterovirus NOT DETECTED NOT DETECTED Final   Influenza A NOT DETECTED NOT DETECTED Final   Influenza B NOT DETECTED NOT DETECTED Final    Parainfluenza Virus 1 NOT DETECTED NOT DETECTED Final   Parainfluenza Virus 2 NOT DETECTED NOT DETECTED Final   Parainfluenza Virus 3 NOT DETECTED NOT DETECTED Final   Parainfluenza Virus 4 NOT DETECTED NOT DETECTED Final   Respiratory Syncytial Virus NOT DETECTED NOT DETECTED Final   Bordetella pertussis NOT DETECTED NOT DETECTED Final   Chlamydophila pneumoniae NOT DETECTED NOT DETECTED Final   Mycoplasma pneumoniae NOT DETECTED NOT DETECTED Final    Comment: Performed at [facility],  [address] [place], [place] [address]  MRSA PCR Screening     Status: None   Collection Time: 06/13/18  8:00 PM  Result Value Ref Range Status   MRSA by PCR NEGATIVE NEGATIVE Final    Comment:        The GeneXpert MRSA Assay (FDA approved for NASAL specimens only), is one component of a comprehensive MRSA colonization surveillance program. It is not intended to diagnose MRSA infection nor to guide or monitor treatment for MRSA infections. Performed at [facility], [place] [address], [place], [place] [address]      Scheduled Meds: . enoxaparin (LOVENOX) injection  40 mg Subcutaneous Q24H  . feeding supplement (ENSURE ENLIVE)  474 mL Oral TID BM  . feeding supplement (PRO-STAT SUGAR FREE 64)  30 mL Oral TID WC  . folic acid  1 mg Oral Daily  . levothyroxine  50 mcg Intravenous Daily  . lipase/protease/amylase  12,000 Units Oral TID WC & HS  . mirtazapine  30 mg Oral QHS  . multivitamin with minerals  1 tablet Oral Daily  . nicotine  21 mg Transdermal Daily  . pantoprazole  20 mg Oral BID  . sucralfate  1 g Oral TID WC & HS   Continuous Infusions: . ampicillin-sulbactam (UNASYN) IV 3 g (06/14/18 1154)     LOS: 5 days   Signature  Lala Lund M.D on 06/14/2018 at 12:48 PM  To page go to [email or web address] - password [facility]

## 2018-06-14 NOTE — Progress Notes (Signed)
Hypoglycemic Event  CBG: 59  Treatment: juice provided  Symptoms: none  Follow-up CBG: Time: 1150 CBG Result: 70  Possible Reasons for Event: low oral intake  Comments/MD notified: N/A    [place]

## 2018-06-15 LAB — COMPREHENSIVE METABOLIC PANEL
ALBUMIN: 2.6 g/dL — AB (ref 3.5–5.0)
ALT: 83 U/L — AB (ref 0–44)
AST: 81 U/L — AB (ref 15–41)
Alkaline Phosphatase: 163 U/L — ABNORMAL HIGH (ref 38–126)
Anion gap: 11 (ref 5–15)
BUN: 13 mg/dL (ref 8–23)
CHLORIDE: 92 mmol/L — AB (ref 98–111)
CO2: 34 mmol/L — AB (ref 22–32)
CREATININE: 0.38 mg/dL — AB (ref 0.61–1.24)
Calcium: 8.1 mg/dL — ABNORMAL LOW (ref 8.9–10.3)
GFR calc Af Amer: >60 mL/min (ref >60)
GFR calc non Af Amer: >60 mL/min (ref >60)
Glucose, Bld: 69 mg/dL — ABNORMAL LOW (ref 70–99)
Potassium: 3.3 mmol/L — ABNORMAL LOW (ref 3.5–5.1)
SODIUM: 137 mmol/L (ref 135–145)
Total Bilirubin: 0.6 mg/dL (ref 0.3–1.2)
Total Protein: 6.8 g/dL (ref 6.5–8.1)

## 2018-06-15 LAB — GLUCOSE, CAPILLARY
GLUCOSE-CAPILLARY: 76 mg/dL (ref 70–99)
GLUCOSE-CAPILLARY: 112 mg/dL — AB (ref 70–99)

## 2018-06-15 LAB — CBC
HCT: 33.6 % — ABNORMAL LOW (ref 39.0–52.0)
Hemoglobin: 11 g/dL — ABNORMAL LOW (ref 13.0–17.0)
MCH: 34.3 pg — ABNORMAL HIGH (ref 26.0–34.0)
MCHC: 32.7 g/dL (ref 30.0–36.0)
MCV: 104.7 fL — ABNORMAL HIGH (ref 78.0–100.0)
Platelets: 144 10*3/uL — ABNORMAL LOW (ref 150–400)
RBC: 3.21 MIL/uL — AB (ref 4.22–5.81)
RDW: 15.2 % (ref 11.5–15.5)
WBC: 4.8 10*3/uL (ref 4.0–10.5)

## 2018-06-15 LAB — T3: T3, Total: 55 ng/dL — ABNORMAL LOW (ref 71–180)

## 2018-06-15 LAB — MAGNESIUM: Magnesium: 1.6 mg/dL — ABNORMAL LOW (ref 1.7–2.4)

## 2018-06-15 LAB — PHOSPHORUS: PHOSPHORUS: 2.3 mg/dL — AB (ref 2.5–4.6)

## 2018-06-15 MED ORDER — SODIUM PHOSPHATES 45 MMOLE/15ML IV SOLN
20.0000 mmol | Freq: Once | INTRAVENOUS | Status: AC
  Administered 2018-06-15: 20 mmol via INTRAVENOUS
  Filled 2018-06-15: qty 6.67

## 2018-06-15 MED ORDER — MEGESTROL ACETATE 400 MG/10ML PO SUSP
400.0000 mg | Freq: Every day | ORAL | Status: DC
  Administered 2018-06-15 – 2018-06-17 (×3): 400 mg via ORAL
  Filled 2018-06-15 (×5): qty 10

## 2018-06-15 MED ORDER — POTASSIUM CHLORIDE CRYS ER 20 MEQ PO TBCR
40.0000 meq | EXTENDED_RELEASE_TABLET | Freq: Once | ORAL | Status: AC
  Administered 2018-06-15: 40 meq via ORAL
  Filled 2018-06-15: qty 2

## 2018-06-15 MED ORDER — ENOXAPARIN SODIUM 30 MG/0.3ML ~~LOC~~ SOLN
30.0000 mg | SUBCUTANEOUS | Status: AC
  Administered 2018-06-16 – 2018-06-18 (×3): 30 mg via SUBCUTANEOUS
  Filled 2018-06-15 (×3): qty 0.3

## 2018-06-15 MED ORDER — MAGNESIUM SULFATE 2 GM/50ML IV SOLN
2.0000 g | Freq: Once | INTRAVENOUS | Status: AC
  Administered 2018-06-15: 2 g via INTRAVENOUS
  Filled 2018-06-15: qty 50

## 2018-06-15 NOTE — Progress Notes (Signed)
Occupational Therapy Treatment Patient Details Name: Russell Williamson MRN: [id number] DOB: 04-12-1956 Today's Date: 06/15/2018    History of present illness Pt is a 62 y.o. male with PMH of HTN, COPD, GERD, depression, anemia, chronic pancreatitis, tonsillar cancer (radiation therapy in remission), tobacco abuse, cocaine abuse, GERD, depression, who presents 06/09/18 with AMS, cough, SOB and abdominal pain. Worked up for sepsis with right lower lobe pneumonia, likely aspiration.   OT comments  Pt continues to demonstrate significant weakness and symptomatic orthostatic hypotension. Reports pain in back and neck. Requires and extended time for all mobility and ADL. Continue to recommend SNF.   Follow Up Recommendations  SNF;Supervision/Assistance - 24 hour    Equipment Recommendations  3 in 1 bedside commode;Tub/shower seat    Recommendations for Other Services      Precautions / Restrictions Precautions Precautions: Fall Precaution Comments: Orthostatic BP Restrictions Weight Bearing Restrictions: No       Mobility Bed Mobility Overal bed mobility: Needs Assistance Bed Mobility: Supine to Sit     Supine to sit: HOB elevated;Mod assist     General bed mobility comments: min assist to raise trunk, mod for hips to EOB with bed pad, increased time  Transfers Overall transfer level: Needs assistance Equipment used: Rolling walker (2 wheeled) Transfers: Sit to/from Stand Sit to Stand: Min assist         General transfer comment: from bed    Balance Overall balance assessment: Needs assistance Sitting-balance support: Single extremity supported;Feet supported Sitting balance-Leahy Scale: Fair     Standing balance support: Bilateral upper extremity supported;During functional activity Standing balance-Leahy Scale: Poor Standing balance comment: Reliance on UE support                           ADL either performed or assessed with clinical judgement   ADL  Overall ADL's : Needs assistance/impaired Eating/Feeding: Supervision/ safety;Sitting;Bed level Eating/Feeding Details (indicate cue type and reason): very slow due to dysphagia             Upper Body Dressing : Minimal assistance;Sitting Upper Body Dressing Details (indicate cue type and reason): for front opening gown                 Functional mobility during ADLs: Minimal assistance;Rolling walker;Cueing for safety       Vision       Perception     Praxis      Cognition Arousal/Alertness: Awake/alert Behavior During Therapy: Flat affect Overall Cognitive Status: Impaired/Different from baseline Area of Impairment: Safety/judgement;Problem solving                         Safety/Judgement: Decreased awareness of deficits;Decreased awareness of safety   Problem Solving: Slow processing;Decreased initiation;Difficulty sequencing;Requires verbal cues;Requires tactile cues General Comments: pt realizes he needs to go to SNF, but 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        Exercises     Shoulder Instructions       General Comments Pt on RA, unable to get reliable read on pulse ox    Pertinent Vitals/ Pain       Pain Assessment: No/denies pain Pain Score: 6  Pain Location: Neck, back, chest Pain Descriptors / Indicators: Sore Pain Intervention(s): Repositioned;Monitored during session;RN gave pain meds during session  Home Living  Prior Functioning/Environment              Frequency  Min 2X/week        Progress Toward Goals  OT Goals(current goals can now be found in the care plan section)  Progress towards OT goals: Progressing toward goals  Acute Rehab OT Goals Patient Stated Goal: "Go home instead of rehab because I'm worried my family will take my stuff" OT Goal Formulation: With patient Time For Goal Achievement: 06/28/18 Potential to Achieve  Goals: Good  Plan Discharge plan remains appropriate    Co-evaluation          OT goals addressed during session: Strengthening/ROM      AM-PAC PT "6 Clicks" Daily Activity     Outcome Measure   Help from another person eating meals?: None Help from another person taking care of personal grooming?: A Little Help from another person toileting, which includes using toliet, bedpan, or urinal?: A Lot Help from another person bathing (including washing, rinsing, drying)?: A Lot Help from another person to put on and taking off regular upper body clothing?: A Little Help from another person to put on and taking off regular lower body clothing?: A Lot 6 Click Score: 16    End of Session    OT Visit Diagnosis: Unsteadiness on feet (R26.81);Other abnormalities of gait and mobility (R26.89);Muscle weakness (generalized) (M62.81)   Activity Tolerance     Patient Left in chair;with call bell/phone within reach;with nursing/sitter in room   Nurse Communication Mobility status        Time: 0964-3838 OT Time Calculation (min): 16 min  Charges: OT General Charges $OT Visit: 1 Visit OT Treatments $Therapeutic Activity: 8-22 mins  Nestor Lewandowsky, OTR/L Acute Rehabilitation Services Pager: [phone] Office: [phone]   Russell Williamson 06/15/2018, 1:04 PM

## 2018-06-15 NOTE — Progress Notes (Signed)
Clinical Social Worker met patient at bedside to discuss discharge plan. CSW was informed by patients RN that he has changed his mind and would like to go to rehab. CSW spoke to patient at bedside to verify statement. Patient stated he would like to stay for a week to get his strength back up. CSW explained the process of patient going to SNF and stated because of his insurance he would have to stay at facility no less than 30 days and give up his disability check. Patient stated he is now unsure if he wants to go to rehab because he does not want to stay for 30 days. Patient stated he would need to think about it before making final decision.   Rhea Pink, MSW,  [place]

## 2018-06-15 NOTE — Progress Notes (Signed)
  Speech Language Pathology Treatment: Dysphagia  Patient Details Name: Russell Williamson MRN: [id number] DOB: 09-30-1956 Today's Date: 06/15/2018 Time: 4097-3532 SLP Time Calculation (min) (ACUTE ONLY): 18 min  Assessment / Plan / Recommendation Clinical Impression  Pt recalled swallow strategies of multiple hard swallows and cough; however, pt did not execute strategies with PO trials without consistent verbal cuing.  Cuing faded over trials and pt was able to maintain; however, suspect that with next PO intake, pt will once again require maximum cuing at outset.  Pt did not recall to take liquid by spoon rather than cup sips.  Pt was observed to have immediate wet cough with teaspoon amounts of honey thick liquid, which decreased with implementation of compensatory strategies noted above.  Pt would benefit from having 1:1 assistance with po intake to reinforce use of swallowing compensatory strategies.  Discussed plan of care with patient. Pt states that he has has a feeding tube in the past.  Pt states that he would prefer to have PEG placed rather than continue with modified diet with compensatory strategies.  Pt states that he will have 24/7 assistance at home and that family can assist with feeding tube.  Unsure if pt is a candidate for PEG at this time.  Consider referral for feeding tube assessment.  If repeat instrumental swallow evaluation is desired prior to placement to assist with decision making, SLP can be reached at [place] office [phone]   HPI HPI: Russell Williamson is a 62 y.o. male with medical history significant of hypertension, COPD, GERD, depression, anemia, chronic pancreatitis, tonsillar cancer (radiation therapy in remission), tobacco abuse, cocaine abuse, GERD, depression admitted with sepsis with right lower lobe pneumonia, hypothermia and hypoglycemia. Known to ST from prior admissions, with severe, chronic pharyngoesophageal dysphagia. Per MBS 02/15/18, dys 1, honey  by teaspoon was recommended.      SLP Plan  Continue with current plan of care;Consult other service (comment)(Consider evaluation for PEG)       Recommendations  Diet recommendations: Dysphagia 1 (puree);Honey-thick liquid Liquids provided via: Teaspoon Medication Administration: Crushed with puree Supervision: Full supervision/cueing for compensatory strategies;Patient able to self feed Compensations: Minimize environmental distractions;Slow rate;Small sips/bites;Multiple dry swallows after each bite/sip;Hard cough after swallow Postural Changes and/or Swallow Maneuvers: Seated upright 90 degrees                General recommendations: Other(comment)(Pt has stated that he would prefer feeding tube to modified diet and compensatory strategies, consider referral to GI) Oral Care Recommendations: Oral care BID Follow up Recommendations: Home health SLP;Outpatient SLP;24 hour supervision/assistance;Skilled Nursing facility SLP Visit Diagnosis: Dysphagia, pharyngoesophageal phase (R13.14) Plan: Continue with current plan of care;Consult other service (comment)(Consider evaluation for PEG)       [place], [place], [place] Office: [phone] Pager: [phone] (06/15/18) 06/15/2018, 3:24 PM

## 2018-06-15 NOTE — Progress Notes (Signed)
Physical Therapy Treatment Patient Details Name: Russell Williamson MRN: [id number] DOB: 1956-07-10 Today's Date: 06/15/2018    History of Present Illness Pt is a 62 y.o. male with PMH of HTN, COPD, GERD, depression, anemia, chronic pancreatitis, tonsillar cancer (radiation therapy in remission), tobacco abuse, cocaine abuse, GERD, depression, who presents 06/09/18 with AMS, cough, SOB and abdominal pain. Worked up for sepsis with right lower lobe pneumonia, likely aspiration.   PT Comments    Pt remains limited by generalized weakness, decreased activity tolerance, and orthostatic hypotension (see values below). Ambulates with RW and min guard, requiring modA to prevent 2x LOB due to BLE instability. Only able to walk 20' before requiring seated rest due to c/o lightheadedness and legs giving out. Continue to discuss recs for SNF-level therapies; pt continues to decline reporting he does not trust family with his belongings. Will continue to follow acutely.  Orthostatic BPs  Sitting 138/126  Standing 108/68  Post-amb 20' 95/73  Sitting ~2 min 102/65     Follow Up Recommendations  SNF;Supervision/Assistance - 24 hour(refusing SNF; will need HHPT services)     Equipment Recommendations  Rolling walker with 5" wheels;3in1 (PT)    Recommendations for Other Services       Precautions / Restrictions Precautions Precautions: Fall;Other (comment) Precaution Comments: Orthostatic BP Restrictions Weight Bearing Restrictions: No    Mobility  Bed Mobility Overal bed mobility: Needs Assistance Bed Mobility: Supine to Sit     Supine to sit: Supervision;HOB elevated     General bed mobility comments: Able to scoot hips to EOB without physical assist  Transfers Overall transfer level: Needs assistance Equipment used: Rolling walker (2 wheeled) Transfers: Sit to/from Stand Sit to Stand: Min guard            Ambulation/Gait Ambulation/Gait assistance: Min guard;Mod assist;+2  safety/equipment Gait Distance (Feet): 20 Feet Assistive device: Rolling walker (2 wheeled) Gait Pattern/deviations: Step-to pattern;Scissoring;Narrow base of support;Trunk flexed Gait velocity: Decreased Gait velocity interpretation: <1.8 ft/sec, indicate of risk for recurrent falls General Gait Details: Slow, unsteady amb into hallway with RW and min guard; pt with BLE instability, weakness/scissoring, requiring 2x modA to prevent fall. Seated rest break with orthostatic BP noted; pt symptomatic   Stairs             Wheelchair Mobility    Modified Rankin (Stroke Patients Only)       Balance Overall balance assessment: Needs assistance Sitting-balance support: Single extremity supported;Feet supported Sitting balance-Leahy Scale: Fair     Standing balance support: Bilateral upper extremity supported;During functional activity Standing balance-Leahy Scale: Poor Standing balance comment: Reliance on UE support                            Cognition Arousal/Alertness: Awake/alert Behavior During Therapy: Flat affect Overall Cognitive Status: No family/caregiver present to determine baseline cognitive functioning                                 General Comments: Decreased awareness of safety, poor insight into deficits. Inconsistent details regarding assist from family at home      Exercises      General Comments General comments (skin integrity, edema, etc.): Pt on RA, unable to get reliable read on pulse ox      Pertinent Vitals/Pain Pain Assessment: Faces Pain Score: 8  Pain Location: Neck, back, chest Pain Descriptors / Indicators: Sore Pain  Intervention(s): Patient requesting pain meds-RN notified;Repositioned;Limited activity within patient's tolerance    Home Living                      Prior Function            PT Goals (current goals can now be found in the care plan section) Acute Rehab PT Goals Patient Stated  Goal: "Go home instead of rehab because I'm worried my family will take my stuff" PT Goal Formulation: With patient Time For Goal Achievement: 06/22/18 Potential to Achieve Goals: Fair Progress towards PT goals: Not progressing toward goals - comment(Limited by symptomatic orthostatic BP)    Frequency    Min 3X/week      PT Plan Current plan remains appropriate    Co-evaluation              AM-PAC PT "6 Clicks" Daily Activity  Outcome Measure  Difficulty turning over in bed (including adjusting bedclothes, sheets and blankets)?: None Difficulty moving from lying on back to sitting on the side of the bed? : None Difficulty sitting down on and standing up from a chair with arms (e.g., wheelchair, bedside commode, etc,.)?: Unable Help needed moving to and from a bed to chair (including a wheelchair)?: A Little Help needed walking in hospital room?: A Little Help needed climbing 3-5 steps with a railing? : A Lot 6 Click Score: 17    End of Session Equipment Utilized During Treatment: Gait belt Activity Tolerance: Patient limited by fatigue Patient left: in chair;with call bell/phone within reach;Other (comment)(on geomat for sacral pressure relief) Nurse Communication: Mobility status PT Visit Diagnosis: Unsteadiness on feet (R26.81);Other abnormalities of gait and mobility (R26.89);Muscle weakness (generalized) (M62.81);Difficulty in walking, not elsewhere classified (R26.2)     Time: 7654-6503 PT Time Calculation (min) (ACUTE ONLY): 23 min  Charges:  $Gait Training: 8-22 mins $Therapeutic Activity: 8-22 mins                     Mabeline Caras, PT, DPT Acute Rehabilitation Services  Pager [phone] Office [place] 06/15/2018, 9:55 AM

## 2018-06-15 NOTE — Progress Notes (Addendum)
Russell Williamson TEAM 1 - Stepdown/ICU TEAM  Russell Williamson  IPJ:[id number] DOB: Oct 27, 1955 DOA: 06/09/2018 PCP: Harvie Junior, MD    Brief Narrative:  62 y.o.malewith a hx ofHTN, COPD, GERD, depression, anemia, chronic pancreatitis, tonsillar cancer (radiation therapy in remission), tobacco abuse, cocaine abuse, and GERD who presented with altered mental status, cough, shortness of breath and abdominal pain.  Subjective: Patient in bed, appears comfortable, denies any headache, no fever, no chest pain or pressure, much improved cough and  shortness of breath , no abdominal pain. No focal weakness.    Assessment & Plan:  Sepsis due to aspiration pneumonia. - severely cachectic with very poor and weak cough reflex - SLP following - currently on a dysphagia 1 diet -with empiric antibiotics, aspiration precautions and feeding assistance he is much improved, sepsis physiology has resolved, advance diet as tolerated, advance activity as tolerated, definitely will require SNF as he is severely cachectic and extremely weak and deconditioned.  Will move out of STU today.  Undiagnosed moderate to severe hypothyroidism.  TSH above 25.  Likely quite high for his body weight which is only 75 pounds.  Placed on IV Synthroid and monitor.  Repeat TSH along with free T4 and T3 in 3 to 4 days check trend.  Chronic pancreatitis with severe cachexia, severe protein calorie malnutrition - chronic abdom pain, continue pancreatic enzyme supplementation and protein supplementation - outpatient GI follow-up - Nutrition assisting w/ support, added Megace for appetite stimulation with good effect.  Transaminitis - due to shock liver from hypotension improving.  Synthetic liver function is stable with stable INR.   GERD - Cont PPI and Carafate   Cocaine abuse -  Cont to counsel on need for abstinence   Ongoing smoking - Counseled to quit - NicoDerm patch  Severe persistent Hypomagnesemia and hypophosphatemia. -   Due to poor nutrition/intake - cont to supplement and follow   Chronic COPD.  Stable.   Hypothermia and hypoglycemia.  Both due to combination of sepsis and undiagnosed hyperthyroidism.  Supportive care as above.  Both improved.  Insulin and C-peptide levels were adequately suppressed.  Random cortisol was stable.   CBG (last 3)  Recent Labs    06/14/18 1149 06/14/18 1620 06/15/18 0632  GLUCAP 70 76 76    DVT prophylaxis: lovenox  Code Status: FULL CODE Family Communication: no family present at time of exam  Disposition Plan: suspect placement will be indicated - cont PT/OT as tolerated   Consultants:  none  Antimicrobials:  Unasyn 9/8 > Rocephin 9/6 > 9/7  Objective: Blood pressure 109/73, pulse 80, temperature 97.8 F (36.6 C), temperature source Oral, resp. rate 20, height 5\' 5"  (1.651 m), weight 36.9 kg, SpO2 99 %.  Intake/Output Summary (Last 24 hours) at 06/15/2018 1428 Last data filed at 06/15/2018 1335 Gross per 24 hour  Intake 827.78 ml  Output 1250 ml  Net -422.22 ml   Filed Weights   06/09/18 2245 06/14/18 0510  Weight: 39.9 kg 36.9 kg    Examination:  Severely cachectic African-American male, Awake Alert, Oriented X 3, No new F.N deficits, Normal affect Roland.AT,PERRAL Supple Neck,No JVD, No cervical lymphadenopathy appriciated.  Symmetrical Chest wall movement, Good air movement bilaterally, CTAB RRR,No Gallops, Rubs or new Murmurs, No Parasternal Heave +ve B.Sounds, Abd Soft, No tenderness, No organomegaly appriciated, No rebound - guarding or rigidity. Condom catheter in place No Cyanosis, Clubbing or edema, No new Rash or bruise    CBC: Recent Labs  Lab 06/09/18 1846  06/13/18 0440 06/14/18 0344 06/15/18 0429  WBC 2.4*   < > 2.6* 3.6* 4.8  NEUTROABS 2.1  --   --   --   --   HGB 9.3*   < > 9.1* 10.5* 11.0*  HCT 28.6*   < > 26.8* 32.0* 33.6*  MCV 103.6*   < > 100.8* 103.2* 104.7*  PLT 137*   < > 135* 105* 144*   < > = values in this  interval not displayed.   Basic Metabolic Panel: Recent Labs  Lab 06/13/18 0440 06/14/18 0344 06/15/18 0429  NA 134* 133* 137  K 3.2* 3.8 3.3*  CL 90* 91* 92*  CO2 37* 33* 34*  GLUCOSE 73 98 69*  BUN 9 12 13   CREATININE 0.34* 0.36* 0.38*  CALCIUM 7.9* 7.8* 8.1*  MG 1.7 1.9 1.6*  PHOS <1.0* 2.4* 2.3*   GFR: Estimated Creatinine Clearance: 50 mL/min (A) (by C-G formula based on SCr of 0.38 mg/dL (L)).  Liver Function Tests: Recent Labs  Lab 06/12/18 0538 06/13/18 0440 06/14/18 0344 06/15/18 0429  AST 133* 139* 103* 81*  ALT 101* 103* 89* 83*  ALKPHOS 151* 165* 155* 163*  BILITOT 0.6 0.5 0.6 0.6  PROT 5.8* 5.9* 6.3* 6.8  ALBUMIN 2.2* 2.3* 2.3* 2.6*   Recent Labs  Lab 06/09/18 1846  LIPASE 20    HbA1C: Hgb A1c MFr Bld  Date/Time Value Ref Range Status  02/11/2018 12:44 AM 6.2 (H) 4.8 - 5.6 % Final    Comment:    (NOTE) Pre diabetes:          5.7%-6.4% Diabetes:              >6.4% Glycemic control for   <7.0% adults with diabetes     CBG: Recent Labs  Lab 06/14/18 0644 06/14/18 1115 06/14/18 1149 06/14/18 1620 06/15/18 0632  GLUCAP 94 59* 70 76 76    Recent Results (from the past 240 hour(s))  Blood culture (routine x 2)     Status: None   Collection Time: 06/09/18  5:20 PM  Result Value Ref Range Status   Specimen Description BLOOD BLOOD LEFT WRIST  Final   Special Requests   Final    BOTTLES DRAWN AEROBIC ONLY Blood Culture results may not be optimal due to an inadequate volume of blood received in culture bottles   Culture   Final    NO GROWTH 5 DAYS Performed at [facility], [place] [address], [place], [place] [address]    Report Status 06/14/2018 FINAL  Final  Blood culture (routine x 2)     Status: None   Collection Time: 06/09/18  5:25 PM  Result Value Ref Range Status   Specimen Description BLOOD RIGHT ANTECUBITAL  Final   Special Requests   Final    BOTTLES DRAWN AEROBIC AND ANAEROBIC Blood Culture adequate volume   Culture    Final    NO GROWTH 5 DAYS Performed at [facility], [place] [address], [place], [place] [address]    Report Status 06/14/2018 FINAL  Final  Respiratory Panel by PCR     Status: None   Collection Time: 06/10/18 12:15 AM  Result Value Ref Range Status   Adenovirus NOT DETECTED NOT DETECTED Final   Coronavirus 229E NOT DETECTED NOT DETECTED Final   Coronavirus HKU1 NOT DETECTED NOT DETECTED Final   Coronavirus NL63 NOT DETECTED NOT DETECTED Final   Coronavirus OC43 NOT DETECTED NOT DETECTED Final   Metapneumovirus NOT  DETECTED NOT DETECTED Final   Rhinovirus / Enterovirus NOT DETECTED NOT DETECTED Final   Influenza A NOT DETECTED NOT DETECTED Final   Influenza B NOT DETECTED NOT DETECTED Final   Parainfluenza Virus 1 NOT DETECTED NOT DETECTED Final   Parainfluenza Virus 2 NOT DETECTED NOT DETECTED Final   Parainfluenza Virus 3 NOT DETECTED NOT DETECTED Final   Parainfluenza Virus 4 NOT DETECTED NOT DETECTED Final   Respiratory Syncytial Virus NOT DETECTED NOT DETECTED Final   Bordetella pertussis NOT DETECTED NOT DETECTED Final   Chlamydophila pneumoniae NOT DETECTED NOT DETECTED Final   Mycoplasma pneumoniae NOT DETECTED NOT DETECTED Final    Comment: Performed at [facility], [place] [address] [place], [place] [address]  MRSA PCR Screening     Status: None   Collection Time: 06/13/18  8:00 PM  Result Value Ref Range Status   MRSA by PCR NEGATIVE NEGATIVE Final    Comment:        The GeneXpert MRSA Assay (FDA approved for NASAL specimens only), is one component of a comprehensive MRSA colonization surveillance program. It is not intended to diagnose MRSA infection nor to guide or monitor treatment for MRSA infections. Performed at [facility], [place] [address] [place], [place] [address]      Scheduled Meds: . [START ON 06/16/2018] enoxaparin (LOVENOX) injection  30 mg Subcutaneous Q24H  . feeding supplement (ENSURE ENLIVE)  474 mL Oral TID BM  . feeding  supplement (PRO-STAT SUGAR FREE 64)  30 mL Oral TID WC  . folic acid  1 mg Oral Daily  . levothyroxine  50 mcg Intravenous Daily  . lipase/protease/amylase  12,000 Units Oral TID WC & HS  . megestrol  400 mg Oral Daily  . mirtazapine  30 mg Oral QHS  . multivitamin with minerals  1 tablet Oral Daily  . nicotine  21 mg Transdermal Daily  . pantoprazole  20 mg Oral BID  . phosphorus  500 mg Oral TID WC & HS  . sucralfate  1 g Oral TID WC & HS   Continuous Infusions: . ampicillin-sulbactam (UNASYN) IV 3 g (06/15/18 1257)  . magnesium sulfate 1 - 4 g bolus IVPB    . sodium phosphate  Dextrose 5% IVPB 20 mmol (06/15/18 1035)     LOS: 6 days   Signature  Lala Lund M.D on 06/15/2018 at 2:28 PM  To page go to [email or web address] - password [facility]

## 2018-06-15 NOTE — Plan of Care (Signed)

## 2018-06-16 DIAGNOSIS — E43 Unspecified severe protein-calorie malnutrition: Secondary | ICD-10-CM

## 2018-06-16 DIAGNOSIS — I10 Essential (primary) hypertension: Secondary | ICD-10-CM

## 2018-06-16 DIAGNOSIS — R109 Unspecified abdominal pain: Secondary | ICD-10-CM

## 2018-06-16 DIAGNOSIS — R627 Adult failure to thrive: Secondary | ICD-10-CM

## 2018-06-16 DIAGNOSIS — A419 Sepsis, unspecified organism: Principal | ICD-10-CM

## 2018-06-16 LAB — COMPREHENSIVE METABOLIC PANEL
ALK PHOS: 154 U/L — AB (ref 38–126)
ALT: 77 U/L — AB (ref 0–44)
AST: 73 U/L — ABNORMAL HIGH (ref 15–41)
Albumin: 2.6 g/dL — ABNORMAL LOW (ref 3.5–5.0)
Anion gap: 12 (ref 5–15)
BILIRUBIN TOTAL: 0.5 mg/dL (ref 0.3–1.2)
BUN: 11 mg/dL (ref 8–23)
CALCIUM: 8.2 mg/dL — AB (ref 8.9–10.3)
CO2: 36 mmol/L — ABNORMAL HIGH (ref 22–32)
CREATININE: 0.45 mg/dL — AB (ref 0.61–1.24)
Chloride: 94 mmol/L — ABNORMAL LOW (ref 98–111)
GFR calc non Af Amer: >60 mL/min (ref >60)
Glucose, Bld: 117 mg/dL — ABNORMAL HIGH (ref 70–99)
Potassium: 3.2 mmol/L — ABNORMAL LOW (ref 3.5–5.1)
Sodium: 142 mmol/L (ref 135–145)
TOTAL PROTEIN: 6.8 g/dL (ref 6.5–8.1)

## 2018-06-16 LAB — CBC
HCT: 31.7 % — ABNORMAL LOW (ref 39.0–52.0)
Hemoglobin: 10.4 g/dL — ABNORMAL LOW (ref 13.0–17.0)
MCH: 34.8 pg — AB (ref 26.0–34.0)
MCHC: 32.8 g/dL (ref 30.0–36.0)
MCV: 106 fL — ABNORMAL HIGH (ref 78.0–100.0)
Platelets: 159 10*3/uL (ref 150–400)
RBC: 2.99 MIL/uL — ABNORMAL LOW (ref 4.22–5.81)
RDW: 15.4 % (ref 11.5–15.5)
WBC: 8.3 10*3/uL (ref 4.0–10.5)

## 2018-06-16 LAB — SULFONYLUREA HYPOGLYCEMICS PANEL, SERUM
ACETOHEXAMIDE: NEGATIVE ug/mL (ref 20–60)
Chlorpropamide: NEGATIVE ug/mL (ref 75–250)
GLIMEPIRIDE: NEGATIVE ng/mL (ref 80–250)
GLIPIZIDE: NEGATIVE ng/mL (ref 200–1000)
Glyburide: NEGATIVE ng/mL
Nateglinide: NEGATIVE ng/mL
Repaglinide: NEGATIVE ng/mL
TOLBUTAMIDE: NEGATIVE ug/mL (ref 40–100)
Tolazamide: NEGATIVE ug/mL

## 2018-06-16 LAB — MAGNESIUM: MAGNESIUM: 2.1 mg/dL (ref 1.7–2.4)

## 2018-06-16 LAB — GLUCOSE, CAPILLARY
GLUCOSE-CAPILLARY: 100 mg/dL — AB (ref 70–99)
GLUCOSE-CAPILLARY: 73 mg/dL (ref 70–99)
Glucose-Capillary: 116 mg/dL — ABNORMAL HIGH (ref 70–99)
Glucose-Capillary: 52 mg/dL — ABNORMAL LOW (ref 70–99)
Glucose-Capillary: 64 mg/dL — ABNORMAL LOW (ref 70–99)
Glucose-Capillary: 31 mg/dL — CL (ref 70–99)

## 2018-06-16 LAB — PHOSPHORUS: PHOSPHORUS: 3 mg/dL (ref 2.5–4.6)

## 2018-06-16 MED ORDER — SODIUM CHLORIDE 0.9 % IV SOLN
INTRAVENOUS | Status: DC | PRN
  Administered 2018-06-16 – 2018-06-17 (×2): 250 mL via INTRAVENOUS

## 2018-06-16 MED ORDER — POTASSIUM CHLORIDE CRYS ER 20 MEQ PO TBCR
40.0000 meq | EXTENDED_RELEASE_TABLET | Freq: Two times a day (BID) | ORAL | Status: AC
  Administered 2018-06-16 (×2): 40 meq via ORAL
  Filled 2018-06-16 (×2): qty 2

## 2018-06-16 NOTE — Progress Notes (Addendum)
PROGRESS NOTE    Russell Williamson  XHB:[id number] DOB: Oct 16, 1955 DOA: 06/09/2018 PCP: Harvie Junior, MD    Brief Narrative:   62 y.o.malewith a hx ofHTN, COPD, GERD, depression, anemia, chronic pancreatitis, tonsillar cancer (radiation therapy in remission), tobacco abuse, cocaine abuse, and GERD who presented with altered mental status, cough, shortness of breath and abdominal pain. He was admitted for sepsis from aspiration pneumonia.    Assessment & Plan:   Principal Problem:   Hypoglycemia Active Problems:   HTN (hypertension)   Pancytopenia (HCC)   Chronic alcoholic pancreatitis (HCC)   Protein-calorie malnutrition, severe   COPD with chronic bronchitis (HCC)   Tobacco abuse   Sepsis ([place])   Acute metabolic encephalopathy   Aspiration pneumonia (HCC)   Lobar pneumonia (HCC)   Abnormal LFTs   Abdominal pain   Sepsis from aspiration pneumonia:  Improving. SLP eval recommending dysphagia 1 diet , with alternate means of nutrition like PEG tube. IR consulted for PEG tube placement.  Meanwhile he is on unasyn for aspiration pneumonia. Day 6 of IV antibiotics.  Pt requesting cough medication.    Severe Hypothyroidism:  TSH >25. On IV synthroid.  Monitor thyroid panel in 4 weeks.    Chronic pancreatitis with cachexia and severe protein calorie malnutrition:  abd pain improving. Nausea is persistent and pt requesting anti emetics. Resume pancreatic enzyme supplementation.  Megace was added for appetite stimulation.  Since his oral nutrition is poor, he wanted to see if he is a candidate for PEG tube placement.     GERD; on PPI and carafate.    Substance abuse with cocaine and tobacco abuse: Counseled and SW consulted for resources.    Persistent hypomagnesemia and hypophosphatemia:  Due to poor intake/ supplementation provided and repeat levels wnl.    Hypothermia and hypoglycemia; probably secondary to sepsis , hypothyroidism.  Improved.  Encourage  oral intake.   Elevated liver function tests: Improving, ? Hypotension? Shock liver on admission.     DVT prophylaxis: Lovenox.  Code Status:  FULL CODE.  Family Communication: none at bedside.  Disposition Plan:  Plan for SNF when bed available.   Consultants:   IR  Physical therapy.   slp  Procedures: none.    Antimicrobials:  unasyn since 9/8  Subjective: Pt reports nausea, abd ain improved.  Requesting for cough medication.   No chest pain or sob.    Objective: Vitals:   06/15/18 0853 06/15/18 1333 06/15/18 2019 06/16/18 0629  BP: 122/65 109/73 106/75 97/69  Pulse:  80  80  Resp: 12 20 13    Temp: 98.1 F (36.7 C) 97.8 F (36.6 C) 98.4 F (36.9 C) 97.9 F (36.6 C)  TempSrc: Oral Oral Oral Oral  SpO2: 99% 99% 96% 97%  Weight:      Height:        Intake/Output Summary (Last 24 hours) at 06/16/2018 1024 Last data filed at 06/16/2018 0600 Gross per 24 hour  Intake 1553.5 ml  Output 850 ml  Net 703.5 ml   Filed Weights   06/09/18 2245 06/14/18 0510  Weight: 39.9 kg 36.9 kg    Examination:  General exam: cachetic looking gentleman.  Respiratory system: Clear to auscultation. Respiratory effort normal. Cardiovascular system: S1 & S2 heard, RRR. No JVD, murmurs, Gastrointestinal system: Abdomen is soft mild gen tenderness bowel sounds good.  Central nervous system: Alert and oriented. No focal neurological deficits. Extremities: poor muscle tone. No pedal edema.  Skin: No rashes, lesions or ulcers Psychiatry: Mood &  affect appropriate.     Data Reviewed: I have personally reviewed following labs and imaging studies  CBC: Recent Labs  Lab 06/09/18 1846  06/12/18 0538 06/13/18 0440 06/14/18 0344 06/15/18 0429 06/16/18 0609  WBC 2.4*   < > 3.5* 2.6* 3.6* 4.8 8.3  NEUTROABS 2.1  --   --   --   --   --   --   HGB 9.3*   < > 10.3* 9.1* 10.5* 11.0* 10.4*  HCT 28.6*   < > 30.0* 26.8* 32.0* 33.6* 31.7*  MCV 103.6*   < > 100.0 100.8* 103.2*  104.7* 106.0*  PLT 137*   < > 104* 135* 105* 144* 159   < > = values in this interval not displayed.   Basic Metabolic Panel: Recent Labs  Lab 06/12/18 0538 06/13/18 0440 06/14/18 0344 06/15/18 0429 06/16/18 0609  NA 138 134* 133* 137 142  K 4.3 3.2* 3.8 3.3* 3.2*  CL 95* 90* 91* 92* 94*  CO2 31 37* 33* 34* 36*  GLUCOSE 134* 73 98 69* 117*  BUN 9 9 12 13 11   CREATININE 0.36* 0.34* 0.36* 0.38* 0.45*  CALCIUM 8.1* 7.9* 7.8* 8.1* 8.2*  MG 1.3* 1.7 1.9 1.6* 2.1  PHOS  --  <1.0* 2.4* 2.3* 3.0   GFR: Estimated Creatinine Clearance: 50 mL/min (A) (by C-G formula based on SCr of 0.45 mg/dL (L)). Liver Function Tests: Recent Labs  Lab 06/12/18 0538 06/13/18 0440 06/14/18 0344 06/15/18 0429 06/16/18 0609  AST 133* 139* 103* 81* 73*  ALT 101* 103* 89* 83* 77*  ALKPHOS 151* 165* 155* 163* 154*  BILITOT 0.6 0.5 0.6 0.6 0.5  PROT 5.8* 5.9* 6.3* 6.8 6.8  ALBUMIN 2.2* 2.3* 2.3* 2.6* 2.6*   Recent Labs  Lab 06/09/18 1846  LIPASE 20   No results for input(s): AMMONIA in the last 168 hours. Coagulation Profile: Recent Labs  Lab 06/14/18 0803  INR 1.24   Cardiac Enzymes: No results for input(s): CKTOTAL, CKMB, CKMBINDEX, TROPONINI in the last 168 hours. BNP (last 3 results) No results for input(s): PROBNP in the last 8760 hours. HbA1C: No results for input(s): HGBA1C in the last 72 hours. CBG: Recent Labs  Lab 06/14/18 1149 06/14/18 1620 06/15/18 0632 06/15/18 1658 06/16/18 0627  GLUCAP 70 76 76 112* 116*   Lipid Profile: No results for input(s): CHOL, HDL, LDLCALC, TRIG, CHOLHDL, LDLDIRECT in the last 72 hours. Thyroid Function Tests: Recent Labs    06/14/18 0803 06/14/18 1502  TSH 21.208*  --   FREET4  --  0.71*   Anemia Panel: No results for input(s): VITAMINB12, FOLATE, FERRITIN, TIBC, IRON, RETICCTPCT in the last 72 hours. Sepsis Labs: Recent Labs  Lab 06/09/18 1855 06/09/18 2153 06/10/18 0729  PROCALCITON  --   --  42.01  LATICACIDVEN 1.41 1.26  1.4    Recent Results (from the past 240 hour(s))  Blood culture (routine x 2)     Status: None   Collection Time: 06/09/18  5:20 PM  Result Value Ref Range Status   Specimen Description BLOOD BLOOD LEFT WRIST  Final   Special Requests   Final    BOTTLES DRAWN AEROBIC ONLY Blood Culture results may not be optimal due to an inadequate volume of blood received in culture bottles   Culture   Final    NO GROWTH 5 DAYS Performed at [facility], [place] [address] [place], [place] [address]    Report Status 06/14/2018 FINAL  Final  Blood  culture (routine x 2)     Status: None   Collection Time: 06/09/18  5:25 PM  Result Value Ref Range Status   Specimen Description BLOOD RIGHT ANTECUBITAL  Final   Special Requests   Final    BOTTLES DRAWN AEROBIC AND ANAEROBIC Blood Culture adequate volume   Culture   Final    NO GROWTH 5 DAYS Performed at [facility], [address], [place], [place] [address]    Report Status 06/14/2018 FINAL  Final  Respiratory Panel by PCR     Status: None   Collection Time: 06/10/18 12:15 AM  Result Value Ref Range Status   Adenovirus NOT DETECTED NOT DETECTED Final   Coronavirus 229E NOT DETECTED NOT DETECTED Final   Coronavirus HKU1 NOT DETECTED NOT DETECTED Final   Coronavirus NL63 NOT DETECTED NOT DETECTED Final   Coronavirus OC43 NOT DETECTED NOT DETECTED Final   Metapneumovirus NOT DETECTED NOT DETECTED Final   Rhinovirus / Enterovirus NOT DETECTED NOT DETECTED Final   Influenza A NOT DETECTED NOT DETECTED Final   Influenza B NOT DETECTED NOT DETECTED Final   Parainfluenza Virus 1 NOT DETECTED NOT DETECTED Final   Parainfluenza Virus 2 NOT DETECTED NOT DETECTED Final   Parainfluenza Virus 3 NOT DETECTED NOT DETECTED Final   Parainfluenza Virus 4 NOT DETECTED NOT DETECTED Final   Respiratory Syncytial Virus NOT DETECTED NOT DETECTED Final   Bordetella pertussis NOT DETECTED NOT DETECTED Final   Chlamydophila pneumoniae NOT DETECTED NOT  DETECTED Final   Mycoplasma pneumoniae NOT DETECTED NOT DETECTED Final    Comment: Performed at [facility], [place] [address], [place], [place] [address]  MRSA PCR Screening     Status: None   Collection Time: 06/13/18  8:00 PM  Result Value Ref Range Status   MRSA by PCR NEGATIVE NEGATIVE Final    Comment:        The GeneXpert MRSA Assay (FDA approved for NASAL specimens only), is one component of a comprehensive MRSA colonization surveillance program. It is not intended to diagnose MRSA infection nor to guide or monitor treatment for MRSA infections. Performed at [facility], [place] [address], [place], [place] [address]          Radiology Studies: No results found.      Scheduled Meds: . enoxaparin (LOVENOX) injection  30 mg Subcutaneous Q24H  . feeding supplement (ENSURE ENLIVE)  474 mL Oral TID BM  . feeding supplement (PRO-STAT SUGAR FREE 64)  30 mL Oral TID WC  . folic acid  1 mg Oral Daily  . levothyroxine  50 mcg Intravenous Daily  . lipase/protease/amylase  12,000 Units Oral TID WC & HS  . megestrol  400 mg Oral Daily  . mirtazapine  30 mg Oral QHS  . multivitamin with minerals  1 tablet Oral Daily  . nicotine  21 mg Transdermal Daily  . pantoprazole  20 mg Oral BID  . phosphorus  500 mg Oral TID WC & HS  . sucralfate  1 g Oral TID WC & HS   Continuous Infusions: . ampicillin-sulbactam (UNASYN) IV 3 g (06/16/18 0410)     LOS: 7 days    Time spent: 35 minutes.     Hosie Poisson, MD [facility] Pager [phone]  If 7PM-7AM, please contact night-coverage [email or web address] Password [facility] 06/16/2018, 10:24 AM

## 2018-06-16 NOTE — Progress Notes (Signed)
CRITICAL VALUE ALERT  Critical Value:  CBG 31  Date & Time Notied:  06/16/2018 at  1730   Provider Notified: MD Karleen Hampshire  Orders Received/Actions taken: gave apple juice will recheck,gave apple juice, CBG up to 73

## 2018-06-16 NOTE — Care Management Note (Signed)
Case Management Note Marvetta Gibbons RN, BSN Unit 4E- RN Care Coordinator  [phone]  Patient Details  Name: Russell Williamson MRN: [id number] Date of Birth: 01/29/1956  Subjective/Objective:  Pt admitted with sepsis RLL pna.                  Action/Plan: PTA pt lived at home with family, has support person - niece-Keysha- contact- [phone]. Per notes from last admission pt was set up with [facility] for [facility] services however per [facility] pt was never opened for services due to [facility] unable to contact pt or niece. [facility] has attempted to engage pt in community- however pt does not follow with assigned PCP on Medicaid card of [facility] and instead sees Max Sane, MD- (located on [facility]?) Per PT eval recommendations for [employer]- CSW has spoken with pt who states he wants to return home and will think about [facility] services- CM will f/u with pt on transition needs for [facility] if pt is agreeable to services.    Expected Discharge Date:                  Expected Discharge Plan:     In-House Referral:     Discharge planning Services     Post Acute Care Choice:    Choice offered to:     DME Arranged:    DME Agency:     HH Arranged:    [place] Agency:     Status of Service:     If discussed at [employer] of Stay Meetings, dates discussed:    Discharge Disposition:   Additional Comments:  06/16/18- 1545- Marvetta Gibbons RN, CM- noted plan for PEG tube- concerned about pt's living situation and returning home with tube, spoke with pt at bedside regarding transition needs- discussed recommendation for SNF- pt states he does not want to go to rehab. Pt reports that he sees Dr. Jimmye Norman but still unable to give location or contact # for PCP- states his niece may have it- explained to pt that if he gets PEG tube and goes home with [facility] and TF we would need PCP info in order to arrange [facility] and TF needs. Pt reports that he has had TF in past when he lived in [place], did bolus feedings  with syringe. Pt reports that he does not feel like he will need RW due to small space at home, states he uses furniture to navigate. CM will f/u with pt after PEG placement for feeding recommendations and transition needs.   Dawayne Patricia, RN 06/16/2018, 3:44 PM

## 2018-06-16 NOTE — Progress Notes (Signed)
Patient ID: Russell Williamson, male   DOB: 08-Jun-1956, 62 y.o.   MRN: [id number]   Request received for percutaneous gastric tube placement  Imaging has been reviewed with Dr Earleen Newport He approves procedure  IR will plan for procedure early next week IR PA to see pt asap for preparation for same

## 2018-06-16 NOTE — Progress Notes (Signed)
New Admission Note:   Arrival Method: Bed from [facility]  Mental Orientation: Alert  Telemetry: Assessment: Completed Skin: sacral wound, wound nurse to see patient  IV: IV left upper arm  Pain: 7/10  Tubes: None Safety Measures: Safety Fall Prevention Plan has been discussed  Admission:  [facility] [place] Orientation: Patient has been orientated to the room, unit and staff.   Family: None   Orders to be reviewed and implemented. Will continue to monitor the patient. Call light has been placed within reach and bed alarm has been activated.   Dillon Bjork, RN [facility]  Phone: [phone]

## 2018-06-16 NOTE — Progress Notes (Signed)
Patient transferred via bed to 6M after report given to [facility]. VSS and all belongings gathered and taken with patient. No new complaints. Prior to move, condom cath came off and was replaced as well as foam dressing to sacrum. Due to pressure ulcer, wound consult ordered due to patient's body habitus and poor nutrition status.

## 2018-06-17 LAB — MAGNESIUM: MAGNESIUM: 1.7 mg/dL (ref 1.7–2.4)

## 2018-06-17 LAB — COMPREHENSIVE METABOLIC PANEL
ALK PHOS: 166 U/L — AB (ref 38–126)
ALT: 72 U/L — AB (ref 0–44)
ANION GAP: 10 (ref 5–15)
AST: 86 U/L — ABNORMAL HIGH (ref 15–41)
Albumin: 2.5 g/dL — ABNORMAL LOW (ref 3.5–5.0)
BUN: 14 mg/dL (ref 8–23)
CO2: 37 mmol/L — ABNORMAL HIGH (ref 22–32)
CREATININE: 0.44 mg/dL — AB (ref 0.61–1.24)
Calcium: 8.4 mg/dL — ABNORMAL LOW (ref 8.9–10.3)
Chloride: 97 mmol/L — ABNORMAL LOW (ref 98–111)
Glucose, Bld: 33 mg/dL — CL (ref 70–99)
Potassium: 3.2 mmol/L — ABNORMAL LOW (ref 3.5–5.1)
Sodium: 144 mmol/L (ref 135–145)
TOTAL PROTEIN: 6.7 g/dL (ref 6.5–8.1)
Total Bilirubin: 0.8 mg/dL (ref 0.3–1.2)

## 2018-06-17 LAB — CBC
HCT: 30.1 % — ABNORMAL LOW (ref 39.0–52.0)
HEMOGLOBIN: 9.7 g/dL — AB (ref 13.0–17.0)
MCH: 33.8 pg (ref 26.0–34.0)
MCHC: 32.2 g/dL (ref 30.0–36.0)
MCV: 104.9 fL — AB (ref 78.0–100.0)
Platelets: 164 10*3/uL (ref 150–400)
RBC: 2.87 MIL/uL — AB (ref 4.22–5.81)
RDW: 15.5 % (ref 11.5–15.5)
WBC: 7.4 10*3/uL (ref 4.0–10.5)

## 2018-06-17 LAB — GLUCOSE, CAPILLARY
GLUCOSE-CAPILLARY: 19 mg/dL — AB (ref 70–99)
GLUCOSE-CAPILLARY: 68 mg/dL — AB (ref 70–99)
GLUCOSE-CAPILLARY: 69 mg/dL — AB (ref 70–99)
Glucose-Capillary: 49 mg/dL — ABNORMAL LOW (ref 70–99)
Glucose-Capillary: <10 mg/dL — CL (ref 70–99)
Glucose-Capillary: 74 mg/dL (ref 70–99)
Glucose-Capillary: 133 mg/dL — ABNORMAL HIGH (ref 70–99)
Glucose-Capillary: 29 mg/dL — CL (ref 70–99)
Glucose-Capillary: 41 mg/dL — CL (ref 70–99)

## 2018-06-17 LAB — PHOSPHORUS: PHOSPHORUS: 2.8 mg/dL (ref 2.5–4.6)

## 2018-06-17 MED ORDER — DEXTROSE-NACL 5-0.9 % IV SOLN
INTRAVENOUS | Status: DC
  Administered 2018-06-17: 09:00:00 via INTRAVENOUS

## 2018-06-17 MED ORDER — DEXTROSE 50 % IV SOLN
INTRAVENOUS | Status: AC
  Administered 2018-06-17: 50 mL
  Filled 2018-06-17: qty 50

## 2018-06-17 MED ORDER — DEXTROSE 10 % IV SOLN
INTRAVENOUS | Status: DC
  Administered 2018-06-17 – 2018-06-20 (×6): via INTRAVENOUS

## 2018-06-17 MED ORDER — POTASSIUM CHLORIDE CRYS ER 20 MEQ PO TBCR
40.0000 meq | EXTENDED_RELEASE_TABLET | Freq: Two times a day (BID) | ORAL | Status: AC
  Administered 2018-06-17: 40 meq via ORAL
  Filled 2018-06-17: qty 2

## 2018-06-17 MED ORDER — OXYCODONE HCL 5 MG PO TABS
10.0000 mg | ORAL_TABLET | ORAL | Status: DC | PRN
  Administered 2018-06-17 – 2018-06-20 (×8): 10 mg via ORAL
  Filled 2018-06-17 (×9): qty 2

## 2018-06-17 NOTE — Progress Notes (Signed)
O2 69, admin. Oxygen at 2lpm, encouraged to breathe through nose. CBG 68. Admin. Amp of D50, d/t pt refusing to eat or drink. Made MD aware, will recheck CBG in 30 min.  Eleanora Neighbor, RN

## 2018-06-17 NOTE — Progress Notes (Signed)
Rechecked CBG and it is 41. Messaged MD, pt already had amp D50 .   Eleanora Neighbor, RN

## 2018-06-17 NOTE — Progress Notes (Signed)
Fingers were cold, warmed and rechecked CBG and it was 69. Amp D 50 admin. Made MD aware.   Eleanora Neighbor, RN

## 2018-06-17 NOTE — Progress Notes (Signed)
CRITICAL VALUE ALERT  Critical Value: CBG: 19  Date & Time Notied: 06/17/18 1630  Orders Received/Actions taken: 1 Amp of D50 given. CBG re-checked now 133. Will continue to monitor.

## 2018-06-17 NOTE — Progress Notes (Signed)
PROGRESS NOTE    Russell Williamson  WJX:[id number] DOB: 1956/01/27 DOA: 06/09/2018 PCP: Harvie Junior, MD    Brief Narrative:   62 y.o.malewith a hx ofHTN, COPD, GERD, depression, anemia, chronic pancreatitis, tonsillar cancer (radiation therapy in remission), tobacco abuse, cocaine abuse, and GERD who presented with altered mental status, cough, shortness of breath and abdominal pain. He was admitted for sepsis from aspiration pneumonia.    Assessment & Plan:   Principal Problem:   Hypoglycemia Active Problems:   HTN (hypertension)   Pancytopenia (HCC)   Chronic alcoholic pancreatitis (HCC)   Protein-calorie malnutrition, severe   COPD with chronic bronchitis (HCC)   Tobacco abuse   Sepsis ([place])   Acute metabolic encephalopathy   Aspiration pneumonia (HCC)   Lobar pneumonia (HCC)   Abnormal LFTs   Abdominal pain   Sepsis from aspiration pneumonia:  Improving. SLP eval recommending dysphagia 1 diet , with alternate means of nutrition like PEG tube. IR consulted for PEG tube placement. Plan for PEG next week.  Meanwhile he is on unasyn for aspiration pneumonia. Day 7 of IV antibiotics. Should d/c antibiotics after today's dose.  He denies sob or chest pain.    Severe Hypothyroidism:  TSH >25. On IV synthroid.  Monitor thyroid panel in 4 weeks.    Chronic pancreatitis with cachexia and severe protein calorie malnutrition:  abd pain improving. Nausea is persistent and pt requesting anti emetics. Resume pancreatic enzyme supplementation.  Megace was added for appetite stimulation.  Since his oral nutrition is poor, he wanted to see if he is a candidate for PEG tube placement.     GERD; on PPI and carafate. No changes in medications.    Substance abuse with cocaine and tobacco abuse: Counseled and SW consulted for resources. PT recommended SNF, but pt wants to go home.    Persistent hypomagnesemia and hypophosphatemia:  Due to poor intake/ supplementation  provided and repeat levels wnl.  Continue to encourage oral intake.    Hypothermia and hypoglycemia; probably secondary to sepsis , hypothyroidism.  Improved.  Encourage oral intake.  Pt had two bouts of hypoglycemia, and it appears he remains asymptomatic, started him on dextrose IV fluids at 50 ml /hr.   Elevated liver function tests: Improving, ? Hypotension? Shock liver on admission. Appear to be improving.     DVT prophylaxis: Lovenox.  Code Status:  FULL CODE.  Family Communication: none at bedside.  Disposition Plan:  Pending PEG placement.   Consultants:   IR  Physical therapy.   slp  Procedures: none.    Antimicrobials:  unasyn since 9/8  Subjective: Continues to have nausea, and some abdominal pain. Hypoglycemic episodes over night twice. He reports not eating dinner yesterday .   Objective: Vitals:   06/16/18 1726 06/16/18 2018 06/17/18 0454 06/17/18 0954  BP: 117/85 126/70 (!) 142/82 132/76  Pulse: 79 68 73 63  Resp:  20 20 18   Temp: 98.6 F (37 C) 98.3 F (36.8 C) 98.7 F (37.1 C) 97.7 F (36.5 C)  TempSrc: Oral Oral Oral Oral  SpO2: 92%  90%   Weight:      Height:        Intake/Output Summary (Last 24 hours) at 06/17/2018 1642 Last data filed at 06/17/2018 1435 Gross per 24 hour  Intake 817.44 ml  Output 1350 ml  Net -532.56 ml   Filed Weights   06/09/18 2245 06/14/18 0510  Weight: 39.9 kg 36.9 kg    Examination:  General exam: cachetic looking  gentleman. Not in any distress. Reports some nausea Respiratory system: Clear to auscultation. Respiratory effort normal. No wheezing orr rhonchi.  Cardiovascular system: S1 & S2 heard, RRR. No JVD, murmurs, Gastrointestinal system: Abdomen is soft no tenderness, no distention. Bowel sounds good.  Central nervous system: Alert and oriented. No focal neurological deficits. Extremities: poor muscle tone. No pedal edema.  Skin: No rashes, lesions or ulcers Psychiatry: Mood & affect appropriate.       Data Reviewed: I have personally reviewed following labs and imaging studies  CBC: Recent Labs  Lab 06/13/18 0440 06/14/18 0344 06/15/18 0429 06/16/18 0609 06/17/18 0637  WBC 2.6* 3.6* 4.8 8.3 7.4  HGB 9.1* 10.5* 11.0* 10.4* 9.7*  HCT 26.8* 32.0* 33.6* 31.7* 30.1*  MCV 100.8* 103.2* 104.7* 106.0* 104.9*  PLT 135* 105* 144* 159 638   Basic Metabolic Panel: Recent Labs  Lab 06/13/18 0440 06/14/18 0344 06/15/18 0429 06/16/18 0609 06/17/18 0637  NA 134* 133* 137 142 144  K 3.2* 3.8 3.3* 3.2* 3.2*  CL 90* 91* 92* 94* 97*  CO2 37* 33* 34* 36* 37*  GLUCOSE 73 98 69* 117* 33*  BUN 9 12 13 11 14   CREATININE 0.34* 0.36* 0.38* 0.45* 0.44*  CALCIUM 7.9* 7.8* 8.1* 8.2* 8.4*  MG 1.7 1.9 1.6* 2.1 1.7  PHOS <1.0* 2.4* 2.3* 3.0 2.8   GFR: Estimated Creatinine Clearance: 50 mL/min (A) (by C-G formula based on SCr of 0.44 mg/dL (L)). Liver Function Tests: Recent Labs  Lab 06/13/18 0440 06/14/18 0344 06/15/18 0429 06/16/18 0609 06/17/18 0637  AST 139* 103* 81* 73* 86*  ALT 103* 89* 83* 77* 72*  ALKPHOS 165* 155* 163* 154* 166*  BILITOT 0.5 0.6 0.6 0.5 0.8  PROT 5.9* 6.3* 6.8 6.8 6.7  ALBUMIN 2.3* 2.3* 2.6* 2.6* 2.5*   No results for input(s): LIPASE, AMYLASE in the last 168 hours. No results for input(s): AMMONIA in the last 168 hours. Coagulation Profile: Recent Labs  Lab 06/14/18 0803  INR 1.24   Cardiac Enzymes: No results for input(s): CKTOTAL, CKMB, CKMBINDEX, TROPONINI in the last 168 hours. BNP (last 3 results) No results for input(s): PROBNP in the last 8760 hours. HbA1C: No results for input(s): HGBA1C in the last 72 hours. CBG: Recent Labs  Lab 06/16/18 1737 06/17/18 0742 06/17/18 0828 06/17/18 1205 06/17/18 1617  GLUCAP 73 <10* 49* 74 19*   Lipid Profile: No results for input(s): CHOL, HDL, LDLCALC, TRIG, CHOLHDL, LDLDIRECT in the last 72 hours. Thyroid Function Tests: No results for input(s): TSH, T4TOTAL, FREET4, T3FREE, THYROIDAB in the  last 72 hours. Anemia Panel: No results for input(s): VITAMINB12, FOLATE, FERRITIN, TIBC, IRON, RETICCTPCT in the last 72 hours. Sepsis Labs: No results for input(s): PROCALCITON, LATICACIDVEN in the last 168 hours.  Recent Results (from the past 240 hour(s))  Blood culture (routine x 2)     Status: None   Collection Time: 06/09/18  5:20 PM  Result Value Ref Range Status   Specimen Description BLOOD BLOOD LEFT WRIST  Final   Special Requests   Final    BOTTLES DRAWN AEROBIC ONLY Blood Culture results may not be optimal due to an inadequate volume of blood received in culture bottles   Culture   Final    NO GROWTH 5 DAYS Performed at [facility], [place] [address] [place], [place] [address]    Report Status 06/14/2018 FINAL  Final  Blood culture (routine x 2)     Status: None   Collection Time:  06/09/18  5:25 PM  Result Value Ref Range Status   Specimen Description BLOOD RIGHT ANTECUBITAL  Final   Special Requests   Final    BOTTLES DRAWN AEROBIC AND ANAEROBIC Blood Culture adequate volume   Culture   Final    NO GROWTH 5 DAYS Performed at [facility], [address] [place], [place] [address]    Report Status 06/14/2018 FINAL  Final  Respiratory Panel by PCR     Status: None   Collection Time: 06/10/18 12:15 AM  Result Value Ref Range Status   Adenovirus NOT DETECTED NOT DETECTED Final   Coronavirus 229E NOT DETECTED NOT DETECTED Final   Coronavirus HKU1 NOT DETECTED NOT DETECTED Final   Coronavirus NL63 NOT DETECTED NOT DETECTED Final   Coronavirus OC43 NOT DETECTED NOT DETECTED Final   Metapneumovirus NOT DETECTED NOT DETECTED Final   Rhinovirus / Enterovirus NOT DETECTED NOT DETECTED Final   Influenza A NOT DETECTED NOT DETECTED Final   Influenza B NOT DETECTED NOT DETECTED Final   Parainfluenza Virus 1 NOT DETECTED NOT DETECTED Final   Parainfluenza Virus 2 NOT DETECTED NOT DETECTED Final   Parainfluenza Virus 3 NOT DETECTED NOT DETECTED Final    Parainfluenza Virus 4 NOT DETECTED NOT DETECTED Final   Respiratory Syncytial Virus NOT DETECTED NOT DETECTED Final   Bordetella pertussis NOT DETECTED NOT DETECTED Final   Chlamydophila pneumoniae NOT DETECTED NOT DETECTED Final   Mycoplasma pneumoniae NOT DETECTED NOT DETECTED Final    Comment: Performed at [facility], [place] [address] [place], [place] [address]  MRSA PCR Screening     Status: None   Collection Time: 06/13/18  8:00 PM  Result Value Ref Range Status   MRSA by PCR NEGATIVE NEGATIVE Final    Comment:        The GeneXpert MRSA Assay (FDA approved for NASAL specimens only), is one component of a comprehensive MRSA colonization surveillance program. It is not intended to diagnose MRSA infection nor to guide or monitor treatment for MRSA infections. Performed at [facility], [place] [address], [place], [place] [address]          Radiology Studies: No results found.      Scheduled Meds: . enoxaparin (LOVENOX) injection  30 mg Subcutaneous Q24H  . feeding supplement (ENSURE ENLIVE)  474 mL Oral TID BM  . feeding supplement (PRO-STAT SUGAR FREE 64)  30 mL Oral TID WC  . folic acid  1 mg Oral Daily  . levothyroxine  50 mcg Intravenous Daily  . lipase/protease/amylase  12,000 Units Oral TID WC & HS  . megestrol  400 mg Oral Daily  . mirtazapine  30 mg Oral QHS  . multivitamin with minerals  1 tablet Oral Daily  . nicotine  21 mg Transdermal Daily  . pantoprazole  20 mg Oral BID  . phosphorus  500 mg Oral TID WC & HS  . potassium chloride  40 mEq Oral BID  . sucralfate  1 g Oral TID WC & HS   Continuous Infusions: . sodium chloride Stopped (06/17/18 0601)  . ampicillin-sulbactam (UNASYN) IV 3 g (06/17/18 1236)  . dextrose 5 % and 0.9% NaCl 50 mL/hr at 06/17/18 0854     LOS: 8 days    Time spent: 35 minutes.     Hosie Poisson, MD [facility] Pager [phone]  If 7PM-7AM, please contact night-coverage [email or web address] Password  [facility] 06/17/2018, 4:42 PM

## 2018-06-17 NOTE — Consult Note (Signed)
Chief Complaint: Patient was seen in consultation today for dysphasia and failure to thrive.  Referring Physician(s): Hosie Poisson  Supervising Physician: Sandi Mariscal  Patient Status: [facility] - In-pt  History of Present Illness: Russell Williamson is a 62 y.o. male with a past medical history of hypertension, seizures, COPD, GERD, tonsillar cancer, chronic pancreatitis, neck malignant neoplasm, pancytopenia, insomnia, anemia, arthritis, and current tobacco abuse. He presented to ED 06/09/2018 with complaints of hypoglycemia and altered mental status. He was found to have sepsis due to aspiration pneumonia and was admitted for management. He was evaluated by speech pathology who recommended dysphagia 1 diet with alternate means of nutrition.  IR requested by Dr. Karleen Hampshire for possible image-guided percutaneous gastrostomy tube placement. Patient awake and alert laying in bed. Complains of abdominal pain, stable at this time. Complains of dyspnea, stable at this time. Denies fever, chills, chest pain, dizziness, or headache.  He is currently taking Lovanox- last dose 06/16/2018 at 1000.   Past Medical History:  Diagnosis Date  . Anemia   . Arthritis   . Cervical adenopathy 04/26/2012  . Chronic back pain    lower  . Chronic pancreatitis ([place])   . Constipation   . COPD (chronic obstructive pulmonary disease) ([place])   . Daily headache 05/23/2012   "small ones"  . Deficiency anemia 04/15/2016  . Dyspnea    with exertion  . G tube feedings ([facility])    hx of  feeding tube in stomach  . GERD (gastroesophageal reflux disease)   . History of blood transfusion ~ 2004   "from the pancreatitis"  . History of radiation therapy 02/02/2011-03/22/2011   head/neck,left tonsil  . Hx of radiation therapy 02/02/11 to 03/22/11   L tonsil  . Hypertension   . Insomnia   . 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 Neck malignant neoplasm ([place]) 05/23/2012  . Pancytopenia ([place]) 02/28/2014  . Poor venous access 04/28/2016  .  Seizures ([place])    alcohol-related last seizure 3 yrs ago  . Thrush 11/02/2013  . 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 Urinary hesitancy     Past Surgical History:  Procedure Laterality Date  . BILE DUCT STENT PLACEMENT     hx of  . CHOLECYSTECTOMY  04/2005  . DIRECT LARYNGOSCOPY  05/23/2012   Procedure: DIRECT LARYNGOSCOPY;  Surgeon: Rozetta Nunnery, MD;  Location: [place];  Service: ENT;  Laterality: N/A;  . DIRECT LARYNGOSCOPY N/A 03/23/2013   Procedure: DIRECT LARYNGOSCOPY;  Surgeon: Rozetta Nunnery, MD;  Location: [place];  Service: ENT;  Laterality: N/A;  . ESOPHAGEAL DILATION N/A 03/23/2013   Procedure: ESOPHAGEAL DILATION;  Surgeon: Rozetta Nunnery, MD;  Location: [place];  Service: ENT;  Laterality: N/A;  . ESOPHAGOGASTRODUODENOSCOPY (EGD) WITH PROPOFOL N/A 03/01/2017   Procedure: ESOPHAGOGASTRODUODENOSCOPY (EGD) WITH PROPOFOL;  Surgeon: Ladene Artist, MD;  Location: [facility];  Service: Endoscopy;  Laterality: N/A;  . FLEXIBLE SIGMOIDOSCOPY N/A 03/01/2017   Procedure: FLEXIBLE SIGMOIDOSCOPY;  Surgeon: Ladene Artist, MD;  Location: [facility];  Service: Endoscopy;  Laterality: N/A;  . IR GENERIC HISTORICAL  04/29/2016   IR US GUIDE VASC ACCESS RIGHT 04/29/2016 [facility]  . IR GENERIC HISTORICAL  04/29/2016   IR FLUORO GUIDE CV LINE RIGHT 04/29/2016 [facility]  . IR GENERIC HISTORICAL  05/12/2016   IR US GUIDE VASC ACCESS LEFT 05/12/2016 [facility]  . IR GENERIC HISTORICAL  05/12/2016   IR FLUORO GUIDE CV LINE LEFT 05/12/2016  [facility]  . IR GENERIC HISTORICAL  06/28/2016   IR US GUIDE VASC ACCESS RIGHT 06/28/2016 Sandi Mariscal, MD [facility]  . IR GENERIC HISTORICAL  06/28/2016   IR FLUORO GUIDE PORT INSERTION RIGHT 06/28/2016 Sandi Mariscal, MD [facility]  . IR REMOVAL TUN ACCESS W/ PORT W/O FL MOD SED  02/10/2017  . MASS BIOPSY  05/23/2012   Procedure: NECK MASS BIOPSY;  Surgeon: Rozetta Nunnery, MD;  Location:  [place];  Service: ENT;  Laterality: N/A;  . RADICAL NECK DISSECTION  05/23/2012   w/mass excision  . RADICAL NECK DISSECTION  05/23/2012   Procedure: RADICAL NECK DISSECTION;  Surgeon: Rozetta Nunnery, MD;  Location: [place];  Service: ENT;  Laterality: Left;  . TIBIA FRACTURE SURGERY  1990's   left    Allergies: Patient has no known allergies.  Medications: Prior to Admission medications   Medication Sig Start Date End Date Taking? Authorizing Provider  albuterol (PROAIR HFA) 108 (90 Base) MCG/ACT inhaler Inhale 2 puffs into the lungs every 4 (four) hours as needed for wheezing or shortness of breath.   Yes [provider]  amLODipine (NORVASC) 2.5 MG tablet Take 2.5 mg by mouth daily. 12/24/16  Yes [provider]  feeding supplement, ENSURE ENLIVE, (ENSURE ENLIVE) LIQD Take 237 mLs by mouth 3 (three) times daily between meals. Patient taking differently: Take 474 mLs by mouth 3 (three) times daily between meals. 2 cans 02/10/18  Yes Elodia Florence., MD  folic acid (FOLVITE) 1 MG tablet Take 1 mg by mouth daily.  05/14/15  Yes [provider]  lipase/protease/amylase (CREON-12/PANCREASE) 12000 UNITS CPEP capsule Take 12,000 Units by mouth 4 (four) times daily.    Yes [provider]  mirtazapine (REMERON) 30 MG tablet Take 30 mg by mouth at bedtime.   Yes [provider]  oxyCODONE (ROXICODONE) 15 MG immediate release tablet Take 1 tablet (15 mg total) by mouth every 8 (eight) hours as needed for pain. Patient taking differently: Take 15 mg by mouth every 4 (four) hours as needed for pain.  02/17/18  Yes Domenic Polite, MD  OXYGEN Inhale 3 L into the lungs as needed (shortness of breath).   Yes [provider]  promethazine (PHENERGAN) 25 MG tablet Take 1 tablet (25 mg total) by mouth every 6 (six) hours as needed for nausea or vomiting. 05/27/18  Yes Law, Shalah Estelle M, PA-C  temazepam (RESTORIL) 30 MG capsule Take 30 mg by mouth at  bedtime.  01/27/18  Yes [provider]  amoxicillin-clavulanate (AUGMENTIN) 875-125 MG tablet Take 1 tablet by mouth every 12 (twelve) hours. 05/27/18   Law, Bea Graff, PA-C  Maltodextrin-Xanthan Gum (RESOURCE THICKENUP CLEAR) POWD Add to liquids to make nectar thick consistency for dysphagia Patient not taking: Reported on 04/23/2018 02/10/18   Elodia Florence., MD  pantoprazole (PROTONIX) 20 MG tablet Take 1 tablet (20 mg total) by mouth 2 (two) times daily. 05/27/18   Law, Bea Graff, PA-C  sucralfate (CARAFATE) 1 GM/10ML suspension Take 10 mLs (1 g total) by mouth 4 (four) times daily -  with meals and at bedtime. 05/27/18   Frederica Kuster, PA-C     Family History  Problem Relation Age of Onset  . COPD Mother   . Colon cancer Neg Hx     Social History   Socioeconomic History  . Marital status: Single    Spouse name: Not on file  . Number of children: Not on  file  . Years of education: Not on file  . Highest education level: Not on file  Occupational History  . Not on file  Social Needs  . Financial resource strain: Not on file  . Food insecurity:    Worry: Not on file    Inability: Not on file  . Transportation needs:    Medical: Not on file    Non-medical: Not on file  Tobacco Use  . Smoking status: Current Every Day Smoker    Packs/day: 0.25    Years: 40.00    Pack years: 10.00    Types: Cigarettes  . Smokeless tobacco: Never Used  . Tobacco comment: hx 1/2 -1 PPD  Substance and Sexual Activity  . Alcohol use: No    Comment: 05/23/2012 hx alcohol abuse, quit ~ 2007  . Drug use: No    Types: Marijuana    Comment: 05/23/2012 "used marijuana in my teens"  . Sexual activity: Never  Lifestyle  . Physical activity:    Days per week: Not on file    Minutes per session: Not on file  . Stress: Not on file  Relationships  . Social connections:    Talks on phone: Not on file    Gets together: Not on file    Attends religious service: Not on file     Active member of club or organization: Not on file    Attends meetings of clubs or organizations: Not on file    Relationship status: Not on file  Other Topics Concern  . Not on file  Social History Narrative  . Not on file     Review of Systems: A 12 point ROS discussed and pertinent positives are indicated in the HPI above.  All other systems are negative.  Review of Systems  Constitutional: Negative for chills and fever.  Respiratory: Positive for shortness of breath. Negative for wheezing.   Cardiovascular: Negative for chest pain and palpitations.  Gastrointestinal: Positive for abdominal pain.  Neurological: Negative for dizziness and headaches.  Psychiatric/Behavioral: Negative for behavioral problems and confusion.    Vital Signs: BP 132/76 (BP Location: Right Arm)   Pulse 63   Temp 97.7 F (36.5 C) (Oral)   Resp 18   Ht 5\' 5"  (1.651 m)   Wt 81 lb 5.6 oz (36.9 kg)   SpO2 90%   BMI 13.54 kg/m   Physical Exam  Constitutional: He is oriented to person, place, and time. He appears well-developed and well-nourished. No distress.  Cardiovascular: Normal rate, regular rhythm and normal heart sounds.  No murmur heard. Pulmonary/Chest: Effort normal and breath sounds normal. No respiratory distress. He has no wheezes.  Neurological: He is alert and oriented to person, place, and time.  Skin: Skin is warm and dry.  Psychiatric: He has a normal mood and affect. His behavior is normal. Judgment and thought content normal.  Nursing note and vitals reviewed.    MD Evaluation Airway: WNL Heart: WNL Abdomen: WNL Chest/ Lungs: WNL ASA  Classification: 4 Mallampati/Airway Score: Two   Imaging: Ct Abdomen Pelvis Wo Contrast  Result Date: 05/26/2018 CLINICAL DATA:  Nausea and vomiting. Abdominal pain. Patient reports generalized body aches and weakness over the past week. Patient reports history of pancreatic and throat cancer. EXAM: CT ABDOMEN AND PELVIS WITHOUT CONTRAST  TECHNIQUE: Multidetector CT imaging of the abdomen and pelvis was performed following the standard protocol without IV contrast. COMPARISON:  CT 02/07/2018 FINDINGS: Lower chest: Diminished pleural effusions from prior CT with mild  right-greater-than-left residual. Associated bibasilar patchy and confluent airspace opacities which appear slightly nodular, also with mild improvement. Emphysema noted. Hepatobiliary: Lack of IV contrast and cachexia significantly limits detailed assessment. Particularly, limited assessment for focal hepatic lesion. Pneumobilia in the left lobe likely postprocedural. No evidence of focal lesion on noncontrast exam. Postcholecystectomy. Biliary tree not well visualized. Pancreas: Pancreas not well seen given cachexia and lack of IV contrast. There are pancreatic calcifications, which are better delineated on prior contrast-enhanced exam. No gross peripancreatic inflammation allowing limitations. Spleen: Normal in size but otherwise limited evaluation. Adrenals/Urinary Tract: Adrenal glands not visualized. Kidneys demonstrate diffuse cortical low-density with increased density in the renal pyramids, of unknown significance. Urinary bladder is physiologically distended. No evidence of wall thickening. Stomach/Bowel: Generalized cachexia paucity of intra-abdominal fat limits assessment. Suspected diffuse gastric wall thickening. No bowel dilatation to suggest obstruction. Formed stool in the distal colon. Proximal colon is not well assessed. Vascular/Lymphatic: Advanced aortic and branch atherosclerosis. Significantly limited evaluation for adenopathy. Air in the region of the left iliac vein, parent suggesting attempted catheter placement. Reproductive: Prostate gland grossly normal. Other: Progressive cachexia over the past 3 months. Suspected mild mesenteric and body wall edema. Musculoskeletal: Bony under mineralization. Subchondral sclerosis of the femoral heads may be AVN versus  secondary to osteopenia. No focal bone lesion. IMPRESSION: 1. Significant cachexia, which has progressed over the past 3 months. In conjunction with lack of IV contrast, this is a significantly limits assessment. 2. Suspected gastric wall thickening which can be seen with gastritis or peptic ulcer disease. 3. Pancreatic calcifications with otherwise limited evaluation of the pancreas. 4. Lung base findings with persistent but improved pleural effusions and patchy, nodular consolidative airspace opacities that remain nonspecific. Suspect chronic aspiration with debris in the airways. 5. Advanced Aortic Atherosclerosis (ICD10-I70.0). Electronically Signed   By: Jeb Levering M.D.   On: 05/26/2018 22:41   Ct Chest W Contrast  Result Date: 05/30/2018 CLINICAL DATA:  62 year old male with history of pancreatic and throat cancer presenting with abdominal pain. EXAM: CT CHEST, ABDOMEN, AND PELVIS WITH CONTRAST TECHNIQUE: Multidetector CT imaging of the chest, abdomen and pelvis was performed following the standard protocol during bolus administration of intravenous contrast. CONTRAST:  182mL ISOVUE-300 IOPAMIDOL (ISOVUE-300) INJECTION 61% COMPARISON:  CT of the abdomen pelvis dated 05/26/2018 and 02/07/2018 and chest CT dated 02/07/2018 FINDINGS: CT CHEST FINDINGS Cardiovascular: There is no cardiomegaly. There is pericardial effusion primarily inferior to the heart. Mild atherosclerotic calcification of the thoracic aorta. The origins of the great vessels of the aortic arch are patent. The central pulmonary arteries are patent as visualized. Mediastinum/Nodes: No hilar adenopathy. Enlarged mediastinal lymph nodes measure 15 mm in the AP window. Oral contrast is noted within the esophagus. Lungs/Pleura: Small and loculated appearing right pleural effusions with no significant interval change in size since the CT of 02/07/2018. There are subsegmental subpleural consolidative changes of the lower lobes, likely  atelectasis. Pneumonia is not excluded. There is background of emphysema. Endobronchial debris noted in the right mainstem bronchus and bilateral lower lobe bronchi which may represent secretions or aspiration. There is no pneumothorax. Musculoskeletal: There is diffuse subcutaneous edema and severe cachexia. No acute osseous pathology. CT ABDOMEN PELVIS FINDINGS Hepatobiliary: The liver is unremarkable as visualized. No intrahepatic biliary ductal dilatation. Cholecystectomy. There is air in the common bile duct. There is a 1.8 x 3.6 cm loculated appearing fluid content in the right upper quadrant adjacent to the cholecystectomy clips which is not characterized but may represent  a loculated seroma or a biloma. Pancreas: Scattered calcification of the pancreas likely sequela of chronic pancreatitis. The pancreas is poorly visualized and suboptimally evaluated. There is however mild dilatation of the main pancreatic duct measuring approximately 5 mm. Spleen: Normal in size without focal abnormality. Adrenals/Urinary Tract: The adrenal glands are unremarkable. There is no hydronephrosis on either side. Subcentimeter right renal hypodense lesion is too small to characterize. The urinary bladder is distended and grossly unremarkable as visualized. Stomach/Bowel: No bowel obstruction. A tubular appearing structure in the right hemipelvis (coronal series 5 images 61-68) likely represents a normal appendix. Vascular/Lymphatic: There is advanced aortoiliac atherosclerotic disease. No portal venous gas. Evaluation of lymph node is very limited due to cachexia. Reproductive: The prostate and seminal vesicles are poorly visualized. Other: Diffuse subcutaneous edema, anasarca and severe cachexia. Musculoskeletal: No acute osseous pathology. IMPRESSION: 1. Diffuse subcutaneous edema and anasarca as well as severe cachexia limiting evaluation. 2. Loculated appearing small right pleural effusion without significant interval  change. 3. Bilateral lower lobe subpleural atelectasis or infiltrate. 4. Bilateral lower lobe endobronchial mucus secretion versus aspiration. 5. Mildly enlarged mediastinal lymph nodes 6. Loculated appearing fluid adjacent to the cholecystectomy may represent seroma or biloma. 7. No bowel obstruction. Electronically Signed   By: Anner Crete M.D.   On: 05/30/2018 06:30   Ct Abdomen Pelvis W Contrast  Result Date: 05/30/2018 CLINICAL DATA:  62 year old male with history of pancreatic and throat cancer presenting with abdominal pain. EXAM: CT CHEST, ABDOMEN, AND PELVIS WITH CONTRAST TECHNIQUE: Multidetector CT imaging of the chest, abdomen and pelvis was performed following the standard protocol during bolus administration of intravenous contrast. CONTRAST:  19mL ISOVUE-300 IOPAMIDOL (ISOVUE-300) INJECTION 61% COMPARISON:  CT of the abdomen pelvis dated 05/26/2018 and 02/07/2018 and chest CT dated 02/07/2018 FINDINGS: CT CHEST FINDINGS Cardiovascular: There is no cardiomegaly. There is pericardial effusion primarily inferior to the heart. Mild atherosclerotic calcification of the thoracic aorta. The origins of the great vessels of the aortic arch are patent. The central pulmonary arteries are patent as visualized. Mediastinum/Nodes: No hilar adenopathy. Enlarged mediastinal lymph nodes measure 15 mm in the AP window. Oral contrast is noted within the esophagus. Lungs/Pleura: Small and loculated appearing right pleural effusions with no significant interval change in size since the CT of 02/07/2018. There are subsegmental subpleural consolidative changes of the lower lobes, likely atelectasis. Pneumonia is not excluded. There is background of emphysema. Endobronchial debris noted in the right mainstem bronchus and bilateral lower lobe bronchi which may represent secretions or aspiration. There is no pneumothorax. Musculoskeletal: There is diffuse subcutaneous edema and severe cachexia. No acute osseous  pathology. CT ABDOMEN PELVIS FINDINGS Hepatobiliary: The liver is unremarkable as visualized. No intrahepatic biliary ductal dilatation. Cholecystectomy. There is air in the common bile duct. There is a 1.8 x 3.6 cm loculated appearing fluid content in the right upper quadrant adjacent to the cholecystectomy clips which is not characterized but may represent a loculated seroma or a biloma. Pancreas: Scattered calcification of the pancreas likely sequela of chronic pancreatitis. The pancreas is poorly visualized and suboptimally evaluated. There is however mild dilatation of the main pancreatic duct measuring approximately 5 mm. Spleen: Normal in size without focal abnormality. Adrenals/Urinary Tract: The adrenal glands are unremarkable. There is no hydronephrosis on either side. Subcentimeter right renal hypodense lesion is too small to characterize. The urinary bladder is distended and grossly unremarkable as visualized. Stomach/Bowel: No bowel obstruction. A tubular appearing structure in the right hemipelvis (coronal series 5 images  61-68) likely represents a normal appendix. Vascular/Lymphatic: There is advanced aortoiliac atherosclerotic disease. No portal venous gas. Evaluation of lymph node is very limited due to cachexia. Reproductive: The prostate and seminal vesicles are poorly visualized. Other: Diffuse subcutaneous edema, anasarca and severe cachexia. Musculoskeletal: No acute osseous pathology. IMPRESSION: 1. Diffuse subcutaneous edema and anasarca as well as severe cachexia limiting evaluation. 2. Loculated appearing small right pleural effusion without significant interval change. 3. Bilateral lower lobe subpleural atelectasis or infiltrate. 4. Bilateral lower lobe endobronchial mucus secretion versus aspiration. 5. Mildly enlarged mediastinal lymph nodes 6. Loculated appearing fluid adjacent to the cholecystectomy may represent seroma or biloma. 7. No bowel obstruction. Electronically Signed   By:  Anner Crete M.D.   On: 05/30/2018 06:30   Dg Chest Port 1 View  Result Date: 06/09/2018 CLINICAL DATA:  Found unconscious. Hypoglycemic. Altered mental status. EXAM: PORTABLE CHEST 1 VIEW COMPARISON:  Two-view chest x-ray 05/26/2018 FINDINGS: Heart size normal. Asymmetric right lower lobe airspace disease is present. There is minimal atelectasis at the left base. A lateral right pleural effusion is noted. IMPRESSION: 1. Asymmetric right lower lobe airspace disease and effusion consistent with lobar pneumonia. Aspiration is considered less likely. 2. Minimal airspace disease the left base likely reflects atelectasis. Electronically Signed   By: San Morelle M.D.   On: 06/09/2018 17:14   Dg Chest Portable 1 View  Result Date: 05/26/2018 CLINICAL DATA:  Generalized body ache and weakness. Black stools for the past 3-4 days. History of pancreatic and throat cancer. COPD. EXAM: PORTABLE CHEST 1 VIEW COMPARISON:  04/23/2018 FINDINGS: Emphysematous hyperinflation of the lungs with superimposed left basilar airspace disease, atelectasis and/or scarring. Findings raise concern for small focus of pneumonia. No overt pulmonary edema. No effusion or pneumothorax. Heart size and mediastinal contours are stable. No aggressive osseous lesions. IMPRESSION: 1. Emphysematous hyperinflation of the lungs consistent with COPD. 2. Superimposed left basilar airspace opacity, atelectasis and/or scarring. Small focus of pneumonia is not excluded. Electronically Signed   By: Ashley Royalty M.D.   On: 05/26/2018 23:37    Labs:  CBC: Recent Labs    06/14/18 0344 06/15/18 0429 06/16/18 0609 06/17/18 0637  WBC 3.6* 4.8 8.3 7.4  HGB 10.5* 11.0* 10.4* 9.7*  HCT 32.0* 33.6* 31.7* 30.1*  PLT 105* 144* 159 164    COAGS: Recent Labs    02/07/18 0003 02/07/18 0807 06/14/18 0803  INR 1.40 1.34 1.24  APTT 29  --   --     BMP: Recent Labs    06/14/18 0344 06/15/18 0429 06/16/18 0609 06/17/18 0637  NA  133* 137 142 144  K 3.8 3.3* 3.2* 3.2*  CL 91* 92* 94* 97*  CO2 33* 34* 36* 37*  GLUCOSE 98 69* 117* 33*  BUN 12 13 11 14   CALCIUM 7.8* 8.1* 8.2* 8.4*  CREATININE 0.36* 0.38* 0.45* 0.44*  GFRNONAA >60 >60 >60 >60  GFRAA >60 >60 >60 >60    LIVER FUNCTION TESTS: Recent Labs    06/14/18 0344 06/15/18 0429 06/16/18 0609 06/17/18 0637  BILITOT 0.6 0.6 0.5 0.8  AST 103* 81* 73* 86*  ALT 89* 83* 77* 72*  ALKPHOS 155* 163* 154* 166*  PROT 6.3* 6.8 6.8 6.7  ALBUMIN 2.3* 2.6* 2.6* 2.5*    TUMOR MARKERS: No results for input(s): AFPTM, CEA, CA199, CHROMGRNA in the last 8760 hours.  Assessment and Plan:  Dysphagia. Failure to thrive. Plan for image-guided percutaneous gastrostomy tube placement tenetively early next week with IR. Patient is  NPO, all feeding supplements will be held at midnight the night before procedure. Denies fever and WBCs WNL. He is currently taking Lovanox- this will also be held the night prior to procedure. INR 1.24 seconds 06/14/2018.  Risks and benefits discussed with the patient including, but not limited to the need for a barium enema during the procedure, bleeding, infection, peritonitis, or damage to adjacent structures. All of the patient's questions were answered, patient is agreeable to proceed. Consent signed and in chart.   Thank you for this interesting consult.  I greatly enjoyed meeting Russell Williamson and look forward to participating in their care.  A copy of this report was sent to the requesting provider on this date.  Electronically Signed: Earley Abide, PA-C 06/17/2018, 1:10 PM   I spent a total of 20 Minutes in face to face in clinical consultation, greater than 50% of which was counseling/coordinating care for dysphasia and failure to thrive.

## 2018-06-17 NOTE — Progress Notes (Signed)
CRITICAL VALUE ALERT  Critical Value: CBG: >10  Date & Time Notied: 06/17/18 0730  Provider Notified: Karleen Hampshire  Orders Received/Actions taken: No hypoglycemic symptoms presented by patient. 1 Amp of D50 given. CBG re-checked, now 49. Additional Amp of D50 given. MD placed orders for continuous fluids at 50cc/hour of D5 NS.

## 2018-06-18 LAB — CBC
HCT: 26.7 % — ABNORMAL LOW (ref 39.0–52.0)
HEMOGLOBIN: 8.4 g/dL — AB (ref 13.0–17.0)
MCH: 34.3 pg — ABNORMAL HIGH (ref 26.0–34.0)
MCHC: 31.5 g/dL (ref 30.0–36.0)
MCV: 109 fL — ABNORMAL HIGH (ref 78.0–100.0)
Platelets: 134 10*3/uL — ABNORMAL LOW (ref 150–400)
RBC: 2.45 MIL/uL — AB (ref 4.22–5.81)
RDW: 15.7 % — ABNORMAL HIGH (ref 11.5–15.5)
WBC: 6.1 10*3/uL (ref 4.0–10.5)

## 2018-06-18 LAB — COMPREHENSIVE METABOLIC PANEL
ALBUMIN: 2 g/dL — AB (ref 3.5–5.0)
ALT: 119 U/L — ABNORMAL HIGH (ref 0–44)
AST: 188 U/L — AB (ref 15–41)
Alkaline Phosphatase: 185 U/L — ABNORMAL HIGH (ref 38–126)
Anion gap: 12 (ref 5–15)
BUN: 13 mg/dL (ref 8–23)
CHLORIDE: 99 mmol/L (ref 98–111)
CO2: 33 mmol/L — AB (ref 22–32)
CREATININE: 0.46 mg/dL — AB (ref 0.61–1.24)
Calcium: 7.3 mg/dL — ABNORMAL LOW (ref 8.9–10.3)
GFR calc Af Amer: >60 mL/min (ref >60)
GFR calc non Af Amer: >60 mL/min (ref >60)
GLUCOSE: 146 mg/dL — AB (ref 70–99)
Potassium: 2.6 mmol/L — CL (ref 3.5–5.1)
SODIUM: 144 mmol/L (ref 135–145)
Total Bilirubin: 0.5 mg/dL (ref 0.3–1.2)
Total Protein: 5.7 g/dL — ABNORMAL LOW (ref 6.5–8.1)

## 2018-06-18 LAB — GLUCOSE, CAPILLARY
GLUCOSE-CAPILLARY: 107 mg/dL — AB (ref 70–99)
GLUCOSE-CAPILLARY: 146 mg/dL — AB (ref 70–99)
GLUCOSE-CAPILLARY: 27 mg/dL — AB (ref 70–99)
Glucose-Capillary: 110 mg/dL — ABNORMAL HIGH (ref 70–99)
Glucose-Capillary: 123 mg/dL — ABNORMAL HIGH (ref 70–99)
Glucose-Capillary: 160 mg/dL — ABNORMAL HIGH (ref 70–99)
Glucose-Capillary: 29 mg/dL — CL (ref 70–99)
Glucose-Capillary: 53 mg/dL — ABNORMAL LOW (ref 70–99)
Glucose-Capillary: 96 mg/dL (ref 70–99)

## 2018-06-18 LAB — MAGNESIUM: Magnesium: 1.3 mg/dL — ABNORMAL LOW (ref 1.7–2.4)

## 2018-06-18 LAB — POTASSIUM: Potassium: 2.7 mmol/L — CL (ref 3.5–5.1)

## 2018-06-18 LAB — TSH: TSH: 11.138 u[IU]/mL — AB (ref 0.350–4.500)

## 2018-06-18 LAB — PHOSPHORUS: Phosphorus: 2.4 mg/dL — ABNORMAL LOW (ref 2.5–4.6)

## 2018-06-18 MED ORDER — MAGNESIUM SULFATE 50 % IJ SOLN
3.0000 g | Freq: Once | INTRAVENOUS | Status: AC
  Administered 2018-06-18: 3 g via INTRAVENOUS
  Filled 2018-06-18: qty 6

## 2018-06-18 MED ORDER — POTASSIUM CHLORIDE CRYS ER 20 MEQ PO TBCR
40.0000 meq | EXTENDED_RELEASE_TABLET | Freq: Once | ORAL | Status: AC
  Administered 2018-06-18: 40 meq via ORAL
  Filled 2018-06-18: qty 2

## 2018-06-18 MED ORDER — POTASSIUM CHLORIDE 10 MEQ/100ML IV SOLN
10.0000 meq | INTRAVENOUS | Status: AC
  Administered 2018-06-18 (×4): 10 meq via INTRAVENOUS
  Filled 2018-06-18 (×4): qty 100

## 2018-06-18 MED ORDER — MEGESTROL ACETATE 400 MG/10ML PO SUSP
800.0000 mg | Freq: Every day | ORAL | Status: DC
  Administered 2018-06-18 – 2018-06-20 (×3): 800 mg via ORAL
  Filled 2018-06-18 (×3): qty 20

## 2018-06-18 NOTE — Progress Notes (Signed)
PROGRESS NOTE    Russell Williamson  GMW:[id number] DOB: [birth year] DOA: 06/09/2018 PCP: Harvie Junior, MD    Brief Narrative:   62 y.o.malewith a hx ofHTN, COPD, GERD, depression, anemia, chronic pancreatitis, tonsillar cancer (radiation therapy in remission), tobacco abuse, cocaine abuse, and GERD who presented with altered mental status, cough, shortness of breath and abdominal pain. He was admitted for sepsis from aspiration pneumonia.    Assessment & Plan:   Principal Problem:   Hypoglycemia Active Problems:   HTN (hypertension)   Pancytopenia (HCC)   Chronic alcoholic pancreatitis (HCC)   Protein-calorie malnutrition, severe   COPD with chronic bronchitis (HCC)   Tobacco abuse   Sepsis ([place])   Acute metabolic encephalopathy   Aspiration pneumonia (HCC)   Lobar pneumonia (HCC)   Abnormal LFTs   Abdominal pain   Sepsis from aspiration pneumonia:  Improving. SLP eval recommending dysphagia 1 diet , with alternate means of nutrition like PEG tube. IR consulted for PEG tube placement. Plan for PEG next week.  Meanwhile he is on unasyn for aspiration pneumonia. Day 7 of IV antibiotics. Should d/c antibiotics after today's dose.  He denies sob or chest pain.    Severe Hypothyroidism:  TSH >25. On IV synthroid.  Monitor thyroid panel in 4 weeks.  TSH Improved from 21 to 11.    Chronic pancreatitis with cachexia and severe protein calorie malnutrition:  abd pain improving. Nausea is persistent and pt requesting anti emetics. Resume pancreatic enzyme supplementation.  Megace was added for appetite stimulation.  Since his oral nutrition is poor, he wanted to see if he is a candidate for PEG tube placement.  IR consulted for PEG.  But pt continues to have severe hypoglycemic episodes, and does not eat,.  Increased megace to 800 mg daily.  He is already on mirtazepine for appetite stimulation.  He was also started on dextrose 10 fluids at 173ml /hr.   Hypokalemia;  replaced. Repeat tonight and replace if needed.    GERD; on PPI and carafate. No changes in medications.    Substance abuse with cocaine and tobacco abuse: Counseled and SW consulted for resources. PT recommended SNF, but pt wants to go home.    Persistent hypomagnesemia and hypophosphatemia:  Due to poor intake/ supplementation provided and repeat levels wnl.  Continue to encourage oral intake.    Hypothermia and hypoglycemia; probably secondary to sepsis , hypothyroidism.  Improved.  Encourage oral intake.  Pt had two bouts of hypoglycemia, and it appears he remains asymptomatic, started him on dextrose IV fluids at 50 ml /hr.   Elevated liver function tests: Improving, ? Hypotension? Shock liver on admission. lfts' are worsening.    Failure ot thrive: Despite multiple measures, pt denies eating and repeatedly is hypoglycemic. Will get palliative care consult for goals of care.      DVT prophylaxis: Lovenox.  Code Status:  FULL CODE.  Family Communication: none at bedside.  Disposition Plan:  Pending PEG placement.   Consultants:   IR  Physical therapy.   slp  Procedures: none.    Antimicrobials:  unasyn since 9/8  Subjective: Continues to have hypoglycemic episodes. Does not eat meals, despite persuasion.  Reports abd pain and nausea same despite pain meds.   Objective: Vitals:   06/17/18 2123 06/18/18 0632 06/18/18 0653 06/18/18 1000  BP: 123/88  132/87 135/84  Pulse: 61  (!) 55 65  Resp: 12  12 18   Temp: 98.1 F (36.7 C) (!) 97.4 F (36.3 C) (!)  97.4 F (36.3 C) 98 F (36.7 C)  TempSrc: Oral Oral Oral Oral  SpO2: (!) 69%  100% 98%  Weight:      Height:        Intake/Output Summary (Last 24 hours) at 06/18/2018 1601 Last data filed at 06/18/2018 1316 Gross per 24 hour  Intake 2000.88 ml  Output 2225 ml  Net -224.12 ml   Filed Weights   06/09/18 2245 06/14/18 0510  Weight: 39.9 kg 36.9 kg    Examination: Unchanged.  General exam:  cachetic looking gentleman. Not in any distress. Reports some nausea Respiratory system: Clear to auscultation. Respiratory effort normal. No wheezing orr rhonchi.  Cardiovascular system: S1 & S2 heard, RRR. No JVD, murmurs, Gastrointestinal system: Abdomen is soft no tenderness, no distention. Bowel sounds good.  Central nervous system: Alert and oriented. No focal neurological deficits. Extremities: poor muscle tone. No pedal edema.  Skin: No rashes, lesions or ulcers Psychiatry: Mood & affect appropriate.     Data Reviewed: I have personally reviewed following labs and imaging studies  CBC: Recent Labs  Lab 06/14/18 0344 06/15/18 0429 06/16/18 0609 06/17/18 0637 06/18/18 0713  WBC 3.6* 4.8 8.3 7.4 6.1  HGB 10.5* 11.0* 10.4* 9.7* 8.4*  HCT 32.0* 33.6* 31.7* 30.1* 26.7*  MCV 103.2* 104.7* 106.0* 104.9* 109.0*  PLT 105* 144* 159 164 790*   Basic Metabolic Panel: Recent Labs  Lab 06/14/18 0344 06/15/18 0429 06/16/18 0609 06/17/18 0637 06/18/18 0713  NA 133* 137 142 144 144  K 3.8 3.3* 3.2* 3.2* 2.6*  CL 91* 92* 94* 97* 99  CO2 33* 34* 36* 37* 33*  GLUCOSE 98 69* 117* 33* 146*  BUN 12 13 11 14 13   CREATININE 0.36* 0.38* 0.45* 0.44* 0.46*  CALCIUM 7.8* 8.1* 8.2* 8.4* 7.3*  MG 1.9 1.6* 2.1 1.7 1.3*  PHOS 2.4* 2.3* 3.0 2.8 2.4*   GFR: Estimated Creatinine Clearance: 50 mL/min (A) (by C-G formula based on SCr of 0.46 mg/dL (L)). Liver Function Tests: Recent Labs  Lab 06/14/18 0344 06/15/18 0429 06/16/18 0609 06/17/18 0637 06/18/18 0713  AST 103* 81* 73* 86* 188*  ALT 89* 83* 77* 72* 119*  ALKPHOS 155* 163* 154* 166* 185*  BILITOT 0.6 0.6 0.5 0.8 0.5  PROT 6.3* 6.8 6.8 6.7 5.7*  ALBUMIN 2.3* 2.6* 2.6* 2.5* 2.0*   No results for input(s): LIPASE, AMYLASE in the last 168 hours. No results for input(s): AMMONIA in the last 168 hours. Coagulation Profile: Recent Labs  Lab 06/14/18 0803  INR 1.24   Cardiac Enzymes: No results for input(s): CKTOTAL, CKMB,  CKMBINDEX, TROPONINI in the last 168 hours. BNP (last 3 results) No results for input(s): PROBNP in the last 8760 hours. HbA1C: No results for input(s): HGBA1C in the last 72 hours. CBG: Recent Labs  Lab 06/18/18 0019 06/18/18 0710 06/18/18 0817 06/18/18 1154 06/18/18 1312  GLUCAP 160* 29* 107* 27* 123*   Lipid Profile: No results for input(s): CHOL, HDL, LDLCALC, TRIG, CHOLHDL, LDLDIRECT in the last 72 hours. Thyroid Function Tests: Recent Labs    06/18/18 0949  TSH 11.138*   Anemia Panel: No results for input(s): VITAMINB12, FOLATE, FERRITIN, TIBC, IRON, RETICCTPCT in the last 72 hours. Sepsis Labs: No results for input(s): PROCALCITON, LATICACIDVEN in the last 168 hours.  Recent Results (from the past 240 hour(s))  Blood culture (routine x 2)     Status: None   Collection Time: 06/09/18  5:20 PM  Result Value Ref Range Status   Specimen Description  BLOOD BLOOD LEFT WRIST  Final   Special Requests   Final    BOTTLES DRAWN AEROBIC ONLY Blood Culture results may not be optimal due to an inadequate volume of blood received in culture bottles   Culture   Final    NO GROWTH 5 DAYS Performed at [facility], [place] [address] [place], [place] [address]    Report Status 06/14/2018 FINAL  Final  Blood culture (routine x 2)     Status: None   Collection Time: 06/09/18  5:25 PM  Result Value Ref Range Status   Specimen Description BLOOD RIGHT ANTECUBITAL  Final   Special Requests   Final    BOTTLES DRAWN AEROBIC AND ANAEROBIC Blood Culture adequate volume   Culture   Final    NO GROWTH 5 DAYS Performed at [facility], [place] [address], [place], [place] [address]    Report Status 06/14/2018 FINAL  Final  Respiratory Panel by PCR     Status: None   Collection Time: 06/10/18 12:15 AM  Result Value Ref Range Status   Adenovirus NOT DETECTED NOT DETECTED Final   Coronavirus 229E NOT DETECTED NOT DETECTED Final   Coronavirus HKU1 NOT DETECTED NOT DETECTED Final    Coronavirus NL63 NOT DETECTED NOT DETECTED Final   Coronavirus OC43 NOT DETECTED NOT DETECTED Final   Metapneumovirus NOT DETECTED NOT DETECTED Final   Rhinovirus / Enterovirus NOT DETECTED NOT DETECTED Final   Influenza A NOT DETECTED NOT DETECTED Final   Influenza B NOT DETECTED NOT DETECTED Final   Parainfluenza Virus 1 NOT DETECTED NOT DETECTED Final   Parainfluenza Virus 2 NOT DETECTED NOT DETECTED Final   Parainfluenza Virus 3 NOT DETECTED NOT DETECTED Final   Parainfluenza Virus 4 NOT DETECTED NOT DETECTED Final   Respiratory Syncytial Virus NOT DETECTED NOT DETECTED Final   Bordetella pertussis NOT DETECTED NOT DETECTED Final   Chlamydophila pneumoniae NOT DETECTED NOT DETECTED Final   Mycoplasma pneumoniae NOT DETECTED NOT DETECTED Final    Comment: Performed at [facility], [place] [address] [place], [place] [address]  MRSA PCR Screening     Status: None   Collection Time: 06/13/18  8:00 PM  Result Value Ref Range Status   MRSA by PCR NEGATIVE NEGATIVE Final    Comment:        The GeneXpert MRSA Assay (FDA approved for NASAL specimens only), is one component of a comprehensive MRSA colonization surveillance program. It is not intended to diagnose MRSA infection nor to guide or monitor treatment for MRSA infections. Performed at [facility], [place] [address] [place], [place] [address]          Radiology Studies: No results found.      Scheduled Meds: . feeding supplement (ENSURE ENLIVE)  474 mL Oral TID BM  . feeding supplement (PRO-STAT SUGAR FREE 64)  30 mL Oral TID WC  . folic acid  1 mg Oral Daily  . levothyroxine  50 mcg Intravenous Daily  . lipase/protease/amylase  12,000 Units Oral TID WC & HS  . megestrol  800 mg Oral Daily  . mirtazapine  30 mg Oral QHS  . multivitamin with minerals  1 tablet Oral Daily  . nicotine  21 mg Transdermal Daily  . pantoprazole  20 mg Oral BID  . phosphorus  500 mg Oral TID WC & HS  . sucralfate  1 g Oral  TID WC & HS   Continuous Infusions: . sodium chloride  Stopped (06/17/18 0601)  . ampicillin-sulbactam (UNASYN) IV 3 g (06/18/18 1226)  . dextrose 100 mL/hr at 06/18/18 1307  . magnesium sulfate 1 - 4 g bolus IVPB       LOS: 9 days    Time spent: 35 minutes.     Hosie Poisson, MD [facility] Pager [phone]  If 7PM-7AM, please contact night-coverage [email or web address] Password [email or web address] 06/18/2018, 4:01 PM

## 2018-06-18 NOTE — Progress Notes (Signed)
CRITICAL VALUE ALERT  Critical Value: K+: 2.7  Date & Time Notied:  06/18/18 1850  Provider Notified: Karleen Hampshire  Orders Received/Actions taken: MD made aware. Will continue to monitor.

## 2018-06-18 NOTE — Progress Notes (Signed)
CRITICAL VALUE ALERT  Critical Value: CBG:29  Date & Time Notied: 06/18/18 0710  Orders Received/Actions taken: Amp D50 given. CBG re-checked and now 107. MD made aware. Will continue to monitor.

## 2018-06-18 NOTE — Progress Notes (Signed)
CRITICAL VALUE ALERT  Critical Value: CBG:54  Date & Time Notied: 06/18/18 1600  Provider Notified: Karleen Hampshire, MD   Orders Received/Actions taken: Amp D50 given. CBG re-checked now 110. Will continue to monitor.

## 2018-06-18 NOTE — Progress Notes (Signed)
CRITICAL VALUE ALERT  Critical Value: CBG:23  Date & Time Notied: 06/18/18 1200  Provider Notified: Karleen Hampshire, MD   Orders Received/Actions taken: 1 Amp of D50 given. CBG re-checked and now 123. Will continue to monitor.

## 2018-06-18 NOTE — Progress Notes (Signed)
CRITICAL VALUE ALERT  Critical Value: K+:2.6  Date & Time Notied: 9/15 0930  Orders Received/Actions taken: Karleen Hampshire, MD placed orders. Orders followed. Will continue to monitor.

## 2018-06-19 ENCOUNTER — Encounter (HOSPITAL_COMMUNITY): Payer: Self-pay | Admitting: Diagnostic Radiology

## 2018-06-19 ENCOUNTER — Inpatient Hospital Stay (HOSPITAL_COMMUNITY): Payer: Medicaid Other

## 2018-06-19 DIAGNOSIS — Z7189 Other specified counseling: Secondary | ICD-10-CM

## 2018-06-19 HISTORY — PX: IR GASTROSTOMY TUBE MOD SED: IMG625

## 2018-06-19 LAB — GLUCOSE, CAPILLARY
GLUCOSE-CAPILLARY: 56 mg/dL — AB (ref 70–99)
GLUCOSE-CAPILLARY: 59 mg/dL — AB (ref 70–99)
GLUCOSE-CAPILLARY: 128 mg/dL — AB (ref 70–99)
GLUCOSE-CAPILLARY: 175 mg/dL — AB (ref 70–99)
GLUCOSE-CAPILLARY: 132 mg/dL — AB (ref 70–99)
Glucose-Capillary: 57 mg/dL — ABNORMAL LOW (ref 70–99)
Glucose-Capillary: 173 mg/dL — ABNORMAL HIGH (ref 70–99)

## 2018-06-19 LAB — BASIC METABOLIC PANEL
ANION GAP: 11 (ref 5–15)
BUN: 7 mg/dL — ABNORMAL LOW (ref 8–23)
CALCIUM: 7.3 mg/dL — AB (ref 8.9–10.3)
CO2: 31 mmol/L (ref 22–32)
CREATININE: 0.35 mg/dL — AB (ref 0.61–1.24)
Chloride: 93 mmol/L — ABNORMAL LOW (ref 98–111)
Glucose, Bld: 188 mg/dL — ABNORMAL HIGH (ref 70–99)
Potassium: 3.2 mmol/L — ABNORMAL LOW (ref 3.5–5.1)
SODIUM: 135 mmol/L (ref 135–145)

## 2018-06-19 LAB — MAGNESIUM: Magnesium: 1.4 mg/dL — ABNORMAL LOW (ref 1.7–2.4)

## 2018-06-19 MED ORDER — LIDOCAINE HCL (PF) 1 % IJ SOLN
INTRAMUSCULAR | Status: AC | PRN
  Administered 2018-06-19: 5 mL

## 2018-06-19 MED ORDER — LIDOCAINE HCL 1 % IJ SOLN
INTRAMUSCULAR | Status: AC
  Filled 2018-06-19: qty 20

## 2018-06-19 MED ORDER — GLUCAGON HCL RDNA (DIAGNOSTIC) 1 MG IJ SOLR
INTRAMUSCULAR | Status: AC
  Filled 2018-06-19: qty 1

## 2018-06-19 MED ORDER — MIDAZOLAM HCL 2 MG/2ML IJ SOLN
INTRAMUSCULAR | Status: AC
  Filled 2018-06-19: qty 2

## 2018-06-19 MED ORDER — IOPAMIDOL (ISOVUE-300) INJECTION 61%
INTRAVENOUS | Status: AC
  Administered 2018-06-19: 20 mL
  Filled 2018-06-19: qty 50

## 2018-06-19 MED ORDER — FENTANYL CITRATE (PF) 100 MCG/2ML IJ SOLN
INTRAMUSCULAR | Status: AC | PRN
  Administered 2018-06-19: 25 ug via INTRAVENOUS

## 2018-06-19 MED ORDER — COLLAGENASE 250 UNIT/GM EX OINT
TOPICAL_OINTMENT | Freq: Every day | CUTANEOUS | Status: DC
  Administered 2018-06-19 – 2018-06-26 (×8): via TOPICAL
  Filled 2018-06-19: qty 30

## 2018-06-19 MED ORDER — GLUCAGON HCL (RDNA) 1 MG IJ SOLR
INTRAMUSCULAR | Status: AC | PRN
  Administered 2018-06-19: 1 mg via INTRAVENOUS

## 2018-06-19 MED ORDER — MIDAZOLAM HCL 2 MG/2ML IJ SOLN
INTRAMUSCULAR | Status: AC | PRN
  Administered 2018-06-19: 1 mg via INTRAVENOUS

## 2018-06-19 MED ORDER — POTASSIUM CHLORIDE CRYS ER 20 MEQ PO TBCR
40.0000 meq | EXTENDED_RELEASE_TABLET | Freq: Two times a day (BID) | ORAL | Status: AC
  Administered 2018-06-19 (×2): 40 meq via ORAL
  Filled 2018-06-19 (×2): qty 2

## 2018-06-19 MED ORDER — FENTANYL CITRATE (PF) 100 MCG/2ML IJ SOLN
INTRAMUSCULAR | Status: AC
  Filled 2018-06-19: qty 2

## 2018-06-19 NOTE — Progress Notes (Signed)
PT Cancellation Note  Patient Details Name: Russell Williamson MRN: [id number] DOB: April 16, 1956   Cancelled Treatment:    Reason Eval/Treat Not Completed: Patient at procedure or test/unavailable   Off the floor;   Will follow,   Roney Marion, PT  Acute Rehabilitation Services Pager [phone] Office [place] 06/19/2018, 9:29 AM

## 2018-06-19 NOTE — Progress Notes (Signed)
Nutrition Follow-up  DOCUMENTATION CODES:   Severe malnutrition in context of chronic illness, Underweight  INTERVENTION:   Tube Feeding:  Recommend starting Osmolite 1.2 @ rate of 15 ml/hr Goal rate of 55 ml/hr; recommend slow titration due to high risk for refeeding Provides 1584 kcals, 73 g of protein, 1069 mL of free water  Goal: Plan to ultimately to transition to either nocturnal TF or bolus feedings as tolerated once enteral nutrition tolerance established   Monitor magnesium and phosphorus q 12 hours for 4 occurrences (more if needed), MD to replete as needed, as pt is at risk for refeeding syndrome given SEVERE CHRONIC MALNUTRITION, severely underweight with prolonged poor po intake.  Continue Magic cup TID with meals, each supplement provides 290 kcal and 9 grams of protein  Continue MVI with minerals Continue pancreatic enzyme therapy; may need to be adjusted with initiation of enteral nutrition  NUTRITION DIAGNOSIS:   Severe Malnutrition related to chronic illness(COPD) as evidenced by severe fat depletion, severe muscle depletion.  Being addressed via TF, supplements  GOAL:   Patient will meet greater than or equal to 90% of their needs  Progressing   MONITOR:   PO intake, Supplement acceptance, Labs, Skin  REASON FOR ASSESSMENT:   Consult Assessment of nutrition requirement/status  ASSESSMENT:   62 yo male with PMH of HTN, COPD, GERD, anemia, chronic pancreatitis, tonsillar cancer, tobacco & cocaine abuse, who was admitted on 9/6 with AMS, SOB, hypoglycemia.  IR placement of G-tube today  Pt on dysphagia I, honey thick diet. Limited po intake. Per RN, having to provide lots of encouragement for pt to take more than a bite of meals.  Pt is edentulous, chronic problems swallowing. Pt indicates PTA he was blending up his food in blender in order to eat it. Pt reports he might be cereal/oatmeal with a banana and that would be meal. Pt indicates he likes  sweets, plan to place pudding and magic cup on all meal trays  Noted episode of hypoglycemia, pt started on D10 infusion at 100 ml/hr; noted potassium and magnesium both low. Pt likely refeeding, lytes currently being supplemented  Labs: potassium 3.2, magnesium 1.4 Meds: D78 @ 242 ml/hr, folic acid, creon, megace, remeron, MVI with minerals, K Phos neutral, KCl, mag sulfate   Diet Order:   Diet Order            Diet NPO time specified Except for: Ice Chips  Diet effective now              EDUCATION NEEDS:   No education needs have been identified at this time  Skin:  Skin Assessment: Skin Integrity Issues: Skin Integrity Issues:: Unstageable DTI: R & L heel Stage II: n/a Unstageable: sacrum  Last BM:  9/16  Height:   Ht Readings from Last 1 Encounters:  06/09/18 5\' 5"  (1.651 m)    Weight:   Wt Readings from Last 1 Encounters:  06/14/18 36.9 kg    Ideal Body Weight:  61.8 kg  BMI:  Body mass index is 13.54 kg/m.  Estimated Nutritional Needs:   Kcal:  1500-1700  Protein:  60-80 gm  Fluid:  1.5 L   Kerman Passey MS, RD, LDN, CNSC [phone] Pager  [phone] Weekend/On-Call Pager

## 2018-06-19 NOTE — Consult Note (Signed)
[facility] Nurse wound consult note Reason for Consult: sacrum and heels  Wound type: Unstageable pressure injury; sacrum Deep tissue pressure injury: right and left heel Pressure Injury POA: Yes Measurement: Right heel 3cm x 5cm x 0cm  Left heel: 3.5cm x 4cm x 0cm  Sacrum: 3cm x 3cm x 0.2cm  Wound bed: left and right heel, dark non blanchable purple tissue Sacrum 100% yellow Drainage (amount, consistency, odor) none Periwound: intact, patient is extremely cachetic. Dressing procedure/placement/frequency: Air mattress in place for moisture management and pressure redistribution Add enzymatic debridement ointment to clean up the sacral wound Prevalon boots to offload the bilateral heel ulcers Foam to protect the vunerable heels from sheer/friction. Dietician in the room, new PEG tube in place. Maximize nutrition.   Discussed POC with patient and bedside nurse.  Re consult if needed, will not follow at this time. Thanks  Aramis Zobel [employer], RN,CWOCN, CNS, [place] [phone])

## 2018-06-19 NOTE — Progress Notes (Signed)
CRITICAL VALUE ALERT  Critical Value: CBG: 57  Date & Time Notied: 06/19/18 1200  Orders Received/Actions taken: No hypoglycemic symptoms reported or observed. Amp of D50 given. CBG re-checked and now 175. Will continue to monitor.

## 2018-06-19 NOTE — Progress Notes (Signed)
PROGRESS NOTE    Russell Williamson  GDJ:[id number] DOB: 05-Jun-1956 DOA: 06/09/2018 PCP: Harvie Junior, MD    Brief Narrative:   62 y.o.malewith a hx ofHTN, COPD, GERD, depression, anemia, chronic pancreatitis, tonsillar cancer (radiation therapy in remission), tobacco abuse, cocaine abuse, and GERD who presented with altered mental status, cough, shortness of breath and abdominal pain. He was admitted for sepsis from aspiration pneumonia.    Assessment & Plan:   Principal Problem:   Hypoglycemia Active Problems:   HTN (hypertension)   Pancytopenia (HCC)   Chronic alcoholic pancreatitis (HCC)   Protein-calorie malnutrition, severe   COPD with chronic bronchitis (HCC)   Tobacco abuse   Sepsis ([place])   Acute metabolic encephalopathy   Aspiration pneumonia (HCC)   Lobar pneumonia (HCC)   Abnormal LFTs   Abdominal pain   Sepsis from aspiration pneumonia:  Improving. SLP eval recommending dysphagia 1 diet , with alternate means for nutrition like PEG tube. IR consulted for PEG tube placement. Plan for PEG today. Meanwhile he completed 8 days of IV Unasyn.  He denies sob or chest pain. He continues to be on 2 lit of  oxygen.    Severe Hypothyroidism:  TSH >25. On IV synthroid.  Monitor thyroid panel in 4 weeks.  TSH Improved from 21 to 11.    Chronic pancreatitis with cachexia and severe protein calorie malnutrition:  abd pain improving. Nausea is persistent and pt requesting anti emetics. Resume pancreatic enzyme supplementation.  Megace was added for appetite stimulation increased it to 800 mg /day. Since his oral nutrition is poor, PEG tube ordered. Plan today.  He is already on mirtazepine for appetite stimulation. But pt continues to have episodes of hypoglycemia despite all these measures, hence was also started on dextrose 10 fluids at 193ml /hr.   Hypokalemia; replaced. Get magnesium level .    GERD; on PPI and carafate. No changes in medications.     Substance abuse with cocaine and tobacco abuse: Counseled and SW consulted for resources. PT recommended SNF, but pt wants to go home.    Persistent hypomagnesemia and hypophosphatemia:  Due to poor intake/ supplementation provided and repeat levels wnl.  Continue to encourage oral intake.    Hypothermia and hypoglycemia; probably secondary to sepsis , hypothyroidism.  Improved.  Encourage oral intake.   Elevated liver function tests: Improving, ? Hypotension? Shock liver on admission. lfts' are worsening. Repeat liver function tests tomorrow.    Failure ot thrive: Despite multiple measures, pt denies eating and repeatedly is hypoglycemic. Will get palliative care consult for goals of care.      DVT prophylaxis: Lovenox.  Code Status:  FULL CODE.  Family Communication: none at bedside.  Disposition Plan:  Pending PEG placement.   Consultants:   IR  Physical therapy.   slp  Procedures: none.    Antimicrobials:  Completed Unasyn.  Subjective: He is sleepy this am, does not report any chest pai or sob , he continues to have some nausea.   Objective: Vitals:   06/19/18 1017 06/19/18 1021 06/19/18 1025 06/19/18 1030  BP: (!) 134/110 134/87 (!) 126/95 (!) 111/91  Pulse: 98 89 92 88  Resp: 13 15 12 16   Temp:      TempSrc:      SpO2: 96% 94% 95% 96%  Weight:      Height:        Intake/Output Summary (Last 24 hours) at 06/19/2018 1058 Last data filed at 06/19/2018 0645 Gross per 24 hour  Intake 2853.84 ml  Output 1000 ml  Net 1853.84 ml   Filed Weights   06/09/18 2245 06/14/18 0510  Weight: 39.9 kg 36.9 kg    Examination: General exam: cachetic looking gentleman. Sleepy. Not in distress.  Respiratory system: diminished at bases, no wheezing or rhonchi.  Cardiovascular system: S1 & S2 heard, RRR. No JVD, murmurs, Gastrointestinal system: Abdomen is soft mild tenderness in the epigastric area. No distention. Bowel sounds good.  Central nervous system:  Alert and oriented. No focal neurological deficits. Extremities: poor muscle tone. No pedal edema.  Skin: No rashes, lesions or ulcers Psychiatry: Mood & affect appropriate.     Data Reviewed: I have personally reviewed following labs and imaging studies  CBC: Recent Labs  Lab 06/14/18 0344 06/15/18 0429 06/16/18 0609 06/17/18 0637 06/18/18 0713  WBC 3.6* 4.8 8.3 7.4 6.1  HGB 10.5* 11.0* 10.4* 9.7* 8.4*  HCT 32.0* 33.6* 31.7* 30.1* 26.7*  MCV 103.2* 104.7* 106.0* 104.9* 109.0*  PLT 105* 144* 159 164 812*   Basic Metabolic Panel: Recent Labs  Lab 06/14/18 0344 06/15/18 0429 06/16/18 0609 06/17/18 0637 06/18/18 0713 06/18/18 1757 06/19/18 0542  NA 133* 137 142 144 144  --  135  K 3.8 3.3* 3.2* 3.2* 2.6* 2.7* 3.2*  CL 91* 92* 94* 97* 99  --  93*  CO2 33* 34* 36* 37* 33*  --  31  GLUCOSE 98 69* 117* 33* 146*  --  188*  BUN 12 13 11 14 13   --  7*  CREATININE 0.36* 0.38* 0.45* 0.44* 0.46*  --  0.35*  CALCIUM 7.8* 8.1* 8.2* 8.4* 7.3*  --  7.3*  MG 1.9 1.6* 2.1 1.7 1.3*  --   --   PHOS 2.4* 2.3* 3.0 2.8 2.4*  --   --    GFR: Estimated Creatinine Clearance: 50 mL/min (A) (by C-G formula based on SCr of 0.35 mg/dL (L)). Liver Function Tests: Recent Labs  Lab 06/14/18 0344 06/15/18 0429 06/16/18 0609 06/17/18 0637 06/18/18 0713  AST 103* 81* 73* 86* 188*  ALT 89* 83* 77* 72* 119*  ALKPHOS 155* 163* 154* 166* 185*  BILITOT 0.6 0.6 0.5 0.8 0.5  PROT 6.3* 6.8 6.8 6.7 5.7*  ALBUMIN 2.3* 2.6* 2.6* 2.5* 2.0*   No results for input(s): LIPASE, AMYLASE in the last 168 hours. No results for input(s): AMMONIA in the last 168 hours. Coagulation Profile: Recent Labs  Lab 06/14/18 0803  INR 1.24   Cardiac Enzymes: No results for input(s): CKTOTAL, CKMB, CKMBINDEX, TROPONINI in the last 168 hours. BNP (last 3 results) No results for input(s): PROBNP in the last 8760 hours. HbA1C: No results for input(s): HGBA1C in the last 72 hours. CBG: Recent Labs  Lab  06/18/18 2009 06/18/18 2353 06/19/18 0451 06/19/18 0506 06/19/18 0732  GLUCAP 96 146* 56* 59* 128*   Lipid Profile: No results for input(s): CHOL, HDL, LDLCALC, TRIG, CHOLHDL, LDLDIRECT in the last 72 hours. Thyroid Function Tests: Recent Labs    06/18/18 0949  TSH 11.138*   Anemia Panel: No results for input(s): VITAMINB12, FOLATE, FERRITIN, TIBC, IRON, RETICCTPCT in the last 72 hours. Sepsis Labs: No results for input(s): PROCALCITON, LATICACIDVEN in the last 168 hours.  Recent Results (from the past 240 hour(s))  Blood culture (routine x 2)     Status: None   Collection Time: 06/09/18  5:20 PM  Result Value Ref Range Status   Specimen Description BLOOD BLOOD LEFT WRIST  Final   Special Requests  Final    BOTTLES DRAWN AEROBIC ONLY Blood Culture results may not be optimal due to an inadequate volume of blood received in culture bottles   Culture   Final    NO GROWTH 5 DAYS Performed at [facility], [place] [address] [place], [place] [address]    Report Status 06/14/2018 FINAL  Final  Blood culture (routine x 2)     Status: None   Collection Time: 06/09/18  5:25 PM  Result Value Ref Range Status   Specimen Description BLOOD RIGHT ANTECUBITAL  Final   Special Requests   Final    BOTTLES DRAWN AEROBIC AND ANAEROBIC Blood Culture adequate volume   Culture   Final    NO GROWTH 5 DAYS Performed at [facility], [place] [address] [place], [place] [address]    Report Status 06/14/2018 FINAL  Final  Respiratory Panel by PCR     Status: None   Collection Time: 06/10/18 12:15 AM  Result Value Ref Range Status   Adenovirus NOT DETECTED NOT DETECTED Final   Coronavirus 229E NOT DETECTED NOT DETECTED Final   Coronavirus HKU1 NOT DETECTED NOT DETECTED Final   Coronavirus NL63 NOT DETECTED NOT DETECTED Final   Coronavirus OC43 NOT DETECTED NOT DETECTED Final   Metapneumovirus NOT DETECTED NOT DETECTED Final   Rhinovirus / Enterovirus NOT DETECTED NOT DETECTED Final    Influenza A NOT DETECTED NOT DETECTED Final   Influenza B NOT DETECTED NOT DETECTED Final   Parainfluenza Virus 1 NOT DETECTED NOT DETECTED Final   Parainfluenza Virus 2 NOT DETECTED NOT DETECTED Final   Parainfluenza Virus 3 NOT DETECTED NOT DETECTED Final   Parainfluenza Virus 4 NOT DETECTED NOT DETECTED Final   Respiratory Syncytial Virus NOT DETECTED NOT DETECTED Final   Bordetella pertussis NOT DETECTED NOT DETECTED Final   Chlamydophila pneumoniae NOT DETECTED NOT DETECTED Final   Mycoplasma pneumoniae NOT DETECTED NOT DETECTED Final    Comment: Performed at [facility], [place] [address], [place], [place] [address]  MRSA PCR Screening     Status: None   Collection Time: 06/13/18  8:00 PM  Result Value Ref Range Status   MRSA by PCR NEGATIVE NEGATIVE Final    Comment:        The GeneXpert MRSA Assay (FDA approved for NASAL specimens only), is one component of a comprehensive MRSA colonization surveillance program. It is not intended to diagnose MRSA infection nor to guide or monitor treatment for MRSA infections. Performed at [facility], [place] [address], [place], [place] [address]          Radiology Studies: No results found.      Scheduled Meds: . fentaNYL      . folic acid  1 mg Oral Daily  . glucagon (human recombinant)      . levothyroxine  50 mcg Intravenous Daily  . lidocaine      . lipase/protease/amylase  12,000 Units Oral TID WC & HS  . megestrol  800 mg Oral Daily  . midazolam      . mirtazapine  30 mg Oral QHS  . multivitamin with minerals  1 tablet Oral Daily  . nicotine  21 mg Transdermal Daily  . pantoprazole  20 mg Oral BID  . phosphorus  500 mg Oral TID WC & HS  . potassium chloride  40 mEq Oral BID WC  . sucralfate  1 g Oral TID WC & HS   Continuous Infusions: . sodium  chloride Stopped (06/17/18 0601)  . ampicillin-sulbactam (UNASYN) IV 3 g (06/19/18 0650)  . dextrose 100 mL/hr at 06/18/18 2330     LOS: 10 days     Time spent: 35 minutes.     Hosie Poisson, MD [facility] Pager [phone]  If 7PM-7AM, please contact night-coverage [email or web address] Password [facility] 06/19/2018, 10:58 AM

## 2018-06-19 NOTE — Progress Notes (Signed)
CBG 59, admin. Amp D50. Made MD aware. Will recheck CBG in 45min.   Eleanora Neighbor, RN

## 2018-06-19 NOTE — Consult Note (Signed)
Consultation Note Date: 06/19/2018   Patient Name: Russell Williamson  DOB: 05-16-1956  MRN: [id number]  Age / Sex: 62 y.o., male  PCP: Harvie Junior, MD Referring Physician: Hosie Poisson, MD  Reason for Consultation: Establishing goals of care  HPI/Patient Profile: 62 y.o. male  with past medical history of chronic alcholic pancreatitis, tonisllar cancer s/p radiation, HTN, COPD, depression, cocaine abuse, GERD, admitted on 06/09/2018 with AMS, SOB, abdominal pain- workup reveals hypoglycemic episode with pneumonia- likely aspiration related, severe malnutrition likely related to chronic pancreatitis- now s/p PEG placement. Palliative medicine consulted for [place].     Clinical Assessment and Goals of Care:  I have reviewed medical records including EPIC notes, labs and imaging, assessed the patient and then met at the bedside along with the patient to discuss diagnosis prognosis, GOC, EOL wishes, disposition and options.  I introduced Palliative Medicine as specialized medical care for people living with serious illness. It focuses on providing relief from the symptoms and stress of a serious illness. The goal is to improve quality of life for both the patient and the family.  We discussed a brief life review of the patient. He was previously living in [place], but moved down here to be closer to his sister.  Prior to admission he was living in his home. He prefers to be called "Russell Williamson". His only family now is a niece- [name]. If he was unable to make decisions for himself it would be his nieceAudelia Acton who would make decisions.     We discussed their current illness and what it means in the larger context of their on-going co-morbidities.  Natural disease trajectory and expectations at EOL were discussed. [name] feels as thought he may be getting close to end of life. He doesn't fear death, but  he isn't welcoming it.   I attempted to elicit values and goals of care important to the patient. He wants to spend as much time as possible with his friends and family.    The difference between aggressive medical intervention and comfort care was considered in light of the patient's goals of care. Patient desires full aggressive medical care.   Advanced directives, concepts specific to code status, artifical feeding and hydration, and rehospitalization were considered and discussed. Patient has just had PEG tube placed and wishes to continue with all aggressive means of medical and life prolonging care including full code status.  Questions and concerns were addressed.  Hard Choices booklet left for review. The family was encouraged to call with questions or concerns.   Primary Decision Maker PATIENT    SUMMARY OF RECOMMENDATIONS -Continue full scope care -Full code status -Recommend outpatient Palliative followup for dysphagia, cachexia  Code Status/Advance Care Planning:  Full code  Additional Recommendations (Limitations, Scope, Preferences):  Full Scope Treatment  Prognosis:    Unable to determine - likely limited due to patient's cachexia  Discharge Planning: To Be Determined  Primary Diagnoses: Present on Admission: . Acute metabolic encephalopathy . Chronic alcoholic pancreatitis ([place]) .  COPD with chronic bronchitis ([place]) . HTN (hypertension) . Pancytopenia ([place]) . Tobacco abuse . Hypoglycemia . Aspiration pneumonia ([place]) . Lobar pneumonia ([place]) . Abnormal LFTs . Protein-calorie malnutrition, severe . Abdominal pain . Sepsis ([place])   I have reviewed the medical record, interviewed the patient and family, and examined the patient. The following aspects are pertinent.  Past Medical History:  Diagnosis Date  . Anemia   . Arthritis   . Cervical adenopathy 04/26/2012  . Chronic back pain    lower  . Chronic pancreatitis ([place])   . Constipation   . COPD  (chronic obstructive pulmonary disease) ([place])   . Daily headache 05/23/2012   "small ones"  . Deficiency anemia 04/15/2016  . Dyspnea    with exertion  . G tube feedings (HCC)    hx of  feeding tube in stomach  . GERD (gastroesophageal reflux disease)   . History of blood transfusion ~ 2004   "from the pancreatitis"  . History of radiation therapy 02/02/2011-03/22/2011   head/neck,left tonsil  . Hx of radiation therapy 02/02/11 to 03/22/11   L tonsil  . Hypertension   . Insomnia   . 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 Pancytopenia ([place]) 02/28/2014  . Poor venous access 04/28/2016  . 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 Thrush 11/02/2013  . Tonsillar cancer ([place]) 12/15/2010   Left  . Urinary hesitancy    Social History   Socioeconomic History  . Marital status: Single    Spouse name: Not on file  . Number of children: Not on file  . Years of education: Not on file  . Highest education level: Not on file  Occupational History  . Not on file  Social Needs  . Financial resource strain: Not on file  . Food insecurity:    Worry: Not on file    Inability: Not on file  . Transportation needs:    Medical: Not on file    Non-medical: Not on file  Tobacco Use  . Smoking status: Current Every Day Smoker    Packs/day: 0.25    Years: 40.00    Pack years: 10.00    Types: Cigarettes  . Smokeless tobacco: Never Used  . Tobacco comment: hx 1/2 -1 PPD  Substance and Sexual Activity  . Alcohol use: No    Comment: 05/23/2012 hx alcohol abuse, quit ~ 2007  . Drug use: No    Types: Marijuana    Comment: 05/23/2012 "used marijuana in my teens"  . Sexual activity: Never  Lifestyle  . Physical activity:    Days per week: Not on file    Minutes per session: Not on file  . Stress: Not on file  Relationships  . Social connections:    Talks on phone: Not on file    Gets together: Not on file    Attends religious service: Not on file     Active member of club or organization: Not on file    Attends meetings of clubs or organizations: Not on file    Relationship status: Not on file  Other Topics Concern  . Not on file  Social History Narrative  . Not on file   Family History  Problem Relation Age of Onset  . COPD Mother   . Colon cancer Neg Hx    Scheduled Meds: . collagenase   Topical Daily  . folic acid  1 mg Oral Daily  .  levothyroxine  50 mcg Intravenous Daily  . lipase/protease/amylase  12,000 Units Oral TID WC & HS  . megestrol  800 mg Oral Daily  . mirtazapine  30 mg Oral QHS  . multivitamin with minerals  1 tablet Oral Daily  . nicotine  21 mg Transdermal Daily  . pantoprazole  20 mg Oral BID  . phosphorus  500 mg Oral TID WC & HS  . potassium chloride  40 mEq Oral BID WC  . sucralfate  1 g Oral TID WC & HS   Continuous Infusions: . sodium chloride Stopped (06/17/18 0601)  . dextrose 100 mL/hr at 06/19/18 1223   PRN Meds:.sodium chloride, albuterol, dextromethorphan-guaiFENesin, dextrose, hydrALAZINE, ibuprofen, ondansetron (ZOFRAN) IV, oxyCODONE, RESOURCE THICKENUP CLEAR, traMADol, zolpidem Medications Prior to Admission:  Prior to Admission medications   Medication Sig Start Date End Date Taking? Authorizing Provider  albuterol (PROAIR HFA) 108 (90 Base) MCG/ACT inhaler Inhale 2 puffs into the lungs every 4 (four) hours as needed for wheezing or shortness of breath.   Yes [provider]  amLODipine (NORVASC) 2.5 MG tablet Take 2.5 mg by mouth daily. 12/24/16  Yes [provider]  feeding supplement, ENSURE ENLIVE, (ENSURE ENLIVE) LIQD Take 237 mLs by mouth 3 (three) times daily between meals. Patient taking differently: Take 474 mLs by mouth 3 (three) times daily between meals. 2 cans 02/10/18  Yes Elodia Florence., MD  folic acid (FOLVITE) 1 MG tablet Take 1 mg by mouth daily.  05/14/15  Yes [provider]  lipase/protease/amylase (CREON-12/PANCREASE) 12000 UNITS CPEP  capsule Take 12,000 Units by mouth 4 (four) times daily.    Yes [provider]  mirtazapine (REMERON) 30 MG tablet Take 30 mg by mouth at bedtime.   Yes [provider]  oxyCODONE (ROXICODONE) 15 MG immediate release tablet Take 1 tablet (15 mg total) by mouth every 8 (eight) hours as needed for pain. Patient taking differently: Take 15 mg by mouth every 4 (four) hours as needed for pain.  02/17/18  Yes Domenic Polite, MD  OXYGEN Inhale 3 L into the lungs as needed (shortness of breath).   Yes [provider]  promethazine (PHENERGAN) 25 MG tablet Take 1 tablet (25 mg total) by mouth every 6 (six) hours as needed for nausea or vomiting. 05/27/18  Yes Law, Alexandra M, PA-C  temazepam (RESTORIL) 30 MG capsule Take 30 mg by mouth at bedtime.  01/27/18  Yes [provider]  amoxicillin-clavulanate (AUGMENTIN) 875-125 MG tablet Take 1 tablet by mouth every 12 (twelve) hours. 05/27/18   Law, Bea Graff, PA-C  Maltodextrin-Xanthan Gum (RESOURCE THICKENUP CLEAR) POWD Add to liquids to make nectar thick consistency for dysphagia Patient not taking: Reported on 04/23/2018 02/10/18   Elodia Florence., MD  pantoprazole (PROTONIX) 20 MG tablet Take 1 tablet (20 mg total) by mouth 2 (two) times daily. 05/27/18   Law, Bea Graff, PA-C  sucralfate (CARAFATE) 1 GM/10ML suspension Take 10 mLs (1 g total) by mouth 4 (four) times daily -  with meals and at bedtime. 05/27/18   Frederica Kuster, PA-C   No Known Allergies Review of Systems  Physical Exam  Constitutional: He is oriented to person, place, and time.  Cachexic, muscle wasting noted  Neurological: He is alert and oriented to person, place, and time.  Skin: Skin is warm and dry.  Nursing note and vitals reviewed.   Vital Signs: BP 95/70 (BP Location: Right Arm)   Pulse 85   Temp  97.7 F (36.5 C) (Oral)   Resp 20   Ht 5' 5"  (1.651 m)   Wt 36.9 kg   SpO2 96%   BMI 13.54 kg/m  Pain Scale: 0-10   Pain Score:  Asleep   SpO2: SpO2: 96 % O2 Device:SpO2: 96 % O2 Flow Rate: .O2 Flow Rate (L/min): 2 L/min  IO: Intake/output summary:   Intake/Output Summary (Last 24 hours) at 06/19/2018 1649 Last data filed at 06/19/2018 1200 Gross per 24 hour  Intake 2082.86 ml  Output 1050 ml  Net 1032.86 ml    LBM: Last BM Date: 06/19/18 Baseline Weight: Weight: 39.9 kg Most recent weight: Weight: 36.9 kg     Palliative Assessment/Data:      Thank you for this consult. Palliative medicine will continue to follow and assist as needed.   Time In:1530 Time Out: 4098 Time Total: 75 mins Greater than 50%  of this time was spent counseling and coordinating care related to the above assessment and plan.  Signed by: Mariana Kaufman, AGNP-C Palliative Medicine    Please contact Palliative Medicine Team phone at [phone] for questions and concerns.  For individual provider: See Shea Evans

## 2018-06-19 NOTE — Procedures (Signed)
Successful placement of 20 Fr gastrostomy tube.  Minimal blood loss and no immediate complication.  See full report in IMAGING.

## 2018-06-19 NOTE — Progress Notes (Signed)
Palliative Medicine consult noted. Due to high referral volume, there may be a delay seeing this patient. Please call the Palliative Medicine Team office at [phone] if recommendations are needed in the interim.  Thank you for inviting [place] to see this patient.  Marjie Skiff Sinai Mahany, RN, BSN, [facility] Palliative Medicine Team 06/19/2018 2:01 PM Office [phone]

## 2018-06-20 DIAGNOSIS — R6251 Failure to thrive (child): Secondary | ICD-10-CM

## 2018-06-20 DIAGNOSIS — Z7189 Other specified counseling: Secondary | ICD-10-CM

## 2018-06-20 LAB — COMPREHENSIVE METABOLIC PANEL
ALT: 73 U/L — ABNORMAL HIGH (ref 0–44)
ANION GAP: 9 (ref 5–15)
AST: 56 U/L — ABNORMAL HIGH (ref 15–41)
Albumin: 1.9 g/dL — ABNORMAL LOW (ref 3.5–5.0)
Alkaline Phosphatase: 150 U/L — ABNORMAL HIGH (ref 38–126)
BILIRUBIN TOTAL: 0.5 mg/dL (ref 0.3–1.2)
BUN: 5 mg/dL — ABNORMAL LOW (ref 8–23)
CO2: 34 mmol/L — ABNORMAL HIGH (ref 22–32)
Calcium: 7.5 mg/dL — ABNORMAL LOW (ref 8.9–10.3)
Chloride: 88 mmol/L — ABNORMAL LOW (ref 98–111)
Creatinine, Ser: 0.43 mg/dL — ABNORMAL LOW (ref 0.61–1.24)
GFR calc non Af Amer: >60 mL/min (ref >60)
Glucose, Bld: 156 mg/dL — ABNORMAL HIGH (ref 70–99)
POTASSIUM: 2.8 mmol/L — AB (ref 3.5–5.1)
SODIUM: 131 mmol/L — AB (ref 135–145)
TOTAL PROTEIN: 5.9 g/dL — AB (ref 6.5–8.1)

## 2018-06-20 LAB — GLUCOSE, CAPILLARY
GLUCOSE-CAPILLARY: 119 mg/dL — AB (ref 70–99)
GLUCOSE-CAPILLARY: 167 mg/dL — AB (ref 70–99)
GLUCOSE-CAPILLARY: 170 mg/dL — AB (ref 70–99)
Glucose-Capillary: 102 mg/dL — ABNORMAL HIGH (ref 70–99)
Glucose-Capillary: 76 mg/dL (ref 70–99)
Glucose-Capillary: 91 mg/dL (ref 70–99)

## 2018-06-20 LAB — MAGNESIUM
MAGNESIUM: 2.6 mg/dL — AB (ref 1.7–2.4)
Magnesium: 1.3 mg/dL — ABNORMAL LOW (ref 1.7–2.4)

## 2018-06-20 LAB — PHOSPHORUS
Phosphorus: 2.6 mg/dL (ref 2.5–4.6)
Phosphorus: 2.8 mg/dL (ref 2.5–4.6)

## 2018-06-20 MED ORDER — OSMOLITE 1.2 CAL PO LIQD
1000.0000 mL | ORAL | Status: DC
  Administered 2018-06-20: 1000 mL
  Filled 2018-06-20: qty 1000

## 2018-06-20 MED ORDER — IBUPROFEN 200 MG PO TABS: 200.0000 mg | ORAL_TABLET | Freq: Four times a day (QID) | ORAL | Status: DC | PRN

## 2018-06-20 MED ORDER — VITAL HIGH PROTEIN PO LIQD: 1000.0000 mL | ORAL | Status: DC

## 2018-06-20 MED ORDER — POTASSIUM CHLORIDE CRYS ER 20 MEQ PO TBCR
40.0000 meq | EXTENDED_RELEASE_TABLET | Freq: Two times a day (BID) | ORAL | Status: AC
  Administered 2018-06-20 (×2): 40 meq via ORAL
  Filled 2018-06-20 (×2): qty 2

## 2018-06-20 MED ORDER — ADULT MULTIVITAMIN W/MINERALS CH
1.0000 | ORAL_TABLET | Freq: Every day | ORAL | Status: DC
  Administered 2018-06-21 – 2018-06-26 (×6): 1
  Filled 2018-06-20 (×6): qty 1

## 2018-06-20 MED ORDER — K PHOS MONO-SOD PHOS DI & MONO 155-852-130 MG PO TABS
500.0000 mg | ORAL_TABLET | Freq: Three times a day (TID) | ORAL | Status: DC
  Administered 2018-06-20 – 2018-06-26 (×21): 500 mg
  Filled 2018-06-20 (×26): qty 2

## 2018-06-20 MED ORDER — OXYCODONE HCL 5 MG PO TABS
15.0000 mg | ORAL_TABLET | ORAL | Status: DC | PRN
  Administered 2018-06-20 – 2018-06-26 (×18): 15 mg
  Filled 2018-06-20 (×19): qty 3

## 2018-06-20 MED ORDER — FOLIC ACID 1 MG PO TABS
1.0000 mg | ORAL_TABLET | Freq: Every day | ORAL | Status: DC
  Administered 2018-06-21 – 2018-06-26 (×6): 1 mg
  Filled 2018-06-20 (×6): qty 1

## 2018-06-20 MED ORDER — ZOLPIDEM TARTRATE 5 MG PO TABS
5.0000 mg | ORAL_TABLET | Freq: Every evening | ORAL | Status: DC | PRN
  Administered 2018-06-21 – 2018-06-25 (×6): 5 mg
  Filled 2018-06-20 (×6): qty 1

## 2018-06-20 MED ORDER — TRAMADOL HCL 50 MG PO TABS
50.0000 mg | ORAL_TABLET | Freq: Four times a day (QID) | ORAL | Status: DC | PRN
  Administered 2018-06-21 (×2): 50 mg
  Filled 2018-06-20 (×2): qty 1

## 2018-06-20 MED ORDER — POTASSIUM CHLORIDE 10 MEQ/100ML IV SOLN
10.0000 meq | INTRAVENOUS | Status: AC
  Administered 2018-06-20 (×4): 10 meq via INTRAVENOUS
  Filled 2018-06-20 (×4): qty 100

## 2018-06-20 MED ORDER — SUCRALFATE 1 GM/10ML PO SUSP
1.0000 g | Freq: Three times a day (TID) | ORAL | Status: DC
  Administered 2018-06-20 – 2018-06-26 (×21): 1 g
  Filled 2018-06-20 (×23): qty 10

## 2018-06-20 MED ORDER — MAGNESIUM SULFATE 4 GM/100ML IV SOLN
4.0000 g | Freq: Once | INTRAVENOUS | Status: AC
  Administered 2018-06-20: 4 g via INTRAVENOUS
  Filled 2018-06-20: qty 100

## 2018-06-20 MED ORDER — MEGESTROL ACETATE 400 MG/10ML PO SUSP
800.0000 mg | Freq: Every day | ORAL | Status: DC
  Administered 2018-06-21 – 2018-06-26 (×6): 800 mg
  Filled 2018-06-20 (×6): qty 20

## 2018-06-20 MED ORDER — LEVOTHYROXINE SODIUM 100 MCG PO TABS
100.0000 ug | ORAL_TABLET | Freq: Every day | ORAL | Status: DC
  Administered 2018-06-21 – 2018-06-26 (×6): 100 ug
  Filled 2018-06-20 (×6): qty 1

## 2018-06-20 NOTE — Progress Notes (Signed)
  Speech Language Pathology Treatment: Dysphagia  Patient Details Name: Russell Williamson MRN: [id number] DOB: [birth year] Today's Date: 06/20/2018 Time: 4268-3419 SLP Time Calculation (min) (ACUTE ONLY): 12 min  Assessment / Plan / Recommendation Clinical Impression   Pt was lethargic and could not recall yesterday's surgical procedure to place PEG. During dysphagia treatment, he exhibited immediate cough and wet vocal quality following honey thick PO. SLP provided mod verbal cues to use mutiple swallows and hard cough following swallow to clear suspected airway intrusion. Clinician also introduced [facility] Muscle Strength Training device (EMST), educated pt regarding its use and purpose to strengthen respiratory and pharyngeal muscles to assist in facilitation of safe swallow. Pt demonstrated appropriate degree of effort but minimal success using EMST trainer set at lowest resistance level, 5cm H2O. Clinician limited EMST exercise given pt's report of mild pain at surgical site of PEG (left-side abdomen). Recommend continue dysphagia 1 diet, honey thick liquids, with full supervision to enourage use of compensatory swallow stratgies, and begin daily EMST exercises (1-3 reps of 10 exhalations per day). SLP will continue to follow to provide treatment regarding diet safety and efficiency as well as further training and opportunities to advance resistance with EMST.    HPI HPI: Russell Williamson is a 62 y.o. male with medical history significant of hypertension, COPD, GERD, depression, anemia, chronic pancreatitis, tonsillar cancer (radiation therapy in remission), tobacco abuse, cocaine abuse, GERD, depression admitted with sepsis with right lower lobe pneumonia, hypothermia and hypoglycemia. Known to ST from prior admissions, with severe, chronic pharyngoesophageal dysphagia. Per MBS 02/15/18, dys 1, honey by teaspoon was recommended.      SLP Plan  Continue with current plan of care        Recommendations  Diet recommendations: Dysphagia 1 (puree) Liquids provided via: Cup;Teaspoon Medication Administration: Crushed with puree Supervision: Full supervision/cueing for compensatory strategies;Patient able to self feed Compensations: Minimize environmental distractions;Slow rate;Small sips/bites;Multiple dry swallows after each bite/sip;Hard cough after swallow Postural Changes and/or Swallow Maneuvers: Seated upright 90 degrees                Oral Care Recommendations: Oral care BID Follow up Recommendations: Home health SLP;Outpatient SLP;24 hour supervision/assistance;Skilled Nursing facility SLP Visit Diagnosis: Dysphagia, pharyngeal phase (R13.13) Plan: Continue with current plan of care       Jettie Booze, Student SLP                 Jettie Booze 06/20/2018, 4:28 PM

## 2018-06-20 NOTE — Progress Notes (Signed)
Physical Therapy Treatment Patient Details Name: Russell Williamson MRN: [id number] DOB: April 15, 1956 Today's Date: 06/20/2018    History of Present Illness Pt is a 62 y.o. male with PMH of HTN, COPD, GERD, depression, anemia, chronic pancreatitis, tonsillar cancer (radiation therapy in remission), tobacco abuse, cocaine abuse, GERD, depression, who presents 06/09/18 with AMS, cough, SOB and abdominal pain. Worked up for sepsis with right lower lobe pneumonia, likely aspiration.    PT Comments    Continuing work on functional mobility and activity tolerance;  Politely declining progressive amb today, but agreeable to OOB to chair with geomat cushion; Dizziness with upright activity (see orthostatics below) with a SBP drop of 29 mmHg between supine and standing  Follow Up Recommendations  SNF;Supervision/Assistance - 24 hour(refusing SNF; will need HHPT services)     Equipment Recommendations  Rolling walker with 5" wheels;3in1 (PT)    Recommendations for Other Services       Precautions / Restrictions Precautions Precautions: Fall Precaution Comments: Orthostatic BP    Mobility  Bed Mobility Overal bed mobility: Needs Assistance Bed Mobility: Supine to Sit     Supine to sit: Min assist     General bed mobility comments: Min handheld assist to elevate trunk to sit  Transfers Overall transfer level: Needs assistance Equipment used: Rolling walker (2 wheeled) Transfers: Sit to/from Stand Sit to Stand: Min assist         General transfer comment: Cues for hand placement and safety; Dependent on UEs to push to rise; cues to self-monitor for activity tolerance  Ambulation/Gait Ambulation/Gait assistance: Min guard;+2 safety/equipment Gait Distance (Feet): (Pivotal steps bed to chair) Assistive device: Rolling walker (2 wheeled) Gait Pattern/deviations: Narrow base of support     General Gait Details: Declined progressive amb, but agreeable for a few steps to  chair   Stairs             Wheelchair Mobility    Modified Rankin (Stroke Patients Only)       Balance     Sitting balance-Leahy Scale: Fair       Standing balance-Leahy Scale: Poor Standing balance comment: Reliance on UE support                            Cognition Arousal/Alertness: Awake/alert Behavior During Therapy: Flat affect Overall Cognitive Status: No family/caregiver present to determine baseline cognitive functioning Area of Impairment: Safety/judgement;Problem solving                         Safety/Judgement: Decreased awareness of deficits;Decreased awareness of safety            Exercises      General Comments General comments (skin integrity, edema, etc.):   06/20/18 1040  Vital Signs  Patient Position (if appropriate) Orthostatic Vitals  Orthostatic Lying   BP- Lying 126/83  Pulse- Lying 88  Orthostatic Sitting  BP- Sitting 106/74  Pulse- Sitting 108  Orthostatic Standing at 0 minutes  BP- Standing at 0 minutes 92/79  Pulse- Standing at 0 minutes 94  Orthostatic Standing at 3 minutes  BP- Standing at 3 minutes 97/71  Pulse- Standing at 3 minutes 84         Pertinent Vitals/Pain Pain Assessment: Faces Faces Pain Scale: Hurts little more Pain Location: Neck, back, chest, feet; grimaced with doffing and donning boots Pain Descriptors / Indicators: Sore Pain Intervention(s): Repositioned;Other (comment)(He was cold, and we provided blankets)  Home Living                      Prior Function            PT Goals (current goals can now be found in the care plan section) Acute Rehab PT Goals Patient Stated Goal: Did not state, but agreeable to OOB to chair PT Goal Formulation: With patient Time For Goal Achievement: 06/22/18 Potential to Achieve Goals: Fair Progress towards PT goals: Progressing toward goals(Slowly)    Frequency    Min 3X/week      PT Plan Current plan remains  appropriate    Co-evaluation              AM-PAC PT "6 Clicks" Daily Activity  Outcome Measure  Difficulty turning over in bed (including adjusting bedclothes, sheets and blankets)?: None Difficulty moving from lying on back to sitting on the side of the bed? : A Little Difficulty sitting down on and standing up from a chair with arms (e.g., wheelchair, bedside commode, etc,.)?: Unable Help needed moving to and from a bed to chair (including a wheelchair)?: A Little Help needed walking in hospital room?: A Little Help needed climbing 3-5 steps with a railing? : A Lot 6 Click Score: 16    End of Session Equipment Utilized During Treatment: Gait belt Activity Tolerance: Patient limited by fatigue Patient left: in chair;with call bell/phone within reach;with chair alarm set(with geomat) Nurse Communication: Mobility status PT Visit Diagnosis: Unsteadiness on feet (R26.81);Other abnormalities of gait and mobility (R26.89);Muscle weakness (generalized) (M62.81);Difficulty in walking, not elsewhere classified (R26.2)     Time: 6606-0045 PT Time Calculation (min) (ACUTE ONLY): 24 min  Charges:  $Therapeutic Activity: 23-37 mins                     Roney Marion, [place]  Acute Rehabilitation Services Pager [phone] Office [place] 06/20/2018, 11:53 AM

## 2018-06-20 NOTE — Progress Notes (Signed)
Brief Nutrition Follow-up:  Dietitian Consult received for initiation/management of Enteral Nutrition.   Full Assessment yesterday IR placed G-tube yesterday Potassium and magnesium remain low.  Palliative Care following, no changes in poc at present  Interventions:  Tube Feeding:  Recommend starting Osmolite 1.2 @ rate of 15 ml/hr If tolerates, plan to begin titration of TF tomorrow Goal rate of 55 ml/hr; recommend slow titration due to high risk for refeeding Provides 1584 kcals, 73 g of protein, 1069 mL of free water  Goal: Plan to ultimately to transition to either nocturnal TF or bolus feedings as tolerated once enteral nutrition tolerance established Suggested Bolus Regimen: Osmolite 1.2   237 mL QID (4 cans/day) Suggested Nocturnal Regimen: Osmolite 1.5 @ 75 ml/hr for 14 hours    Monitor magnesium and phosphorus q 12 hours for 4 occurrences (more if needed), MD to replete as needed. Pt is already showing signs of possible refeeding post initiation of D10 infusion for hypoglycemia with persistent hypokalemia and hypomagnesemia despite supplementation and as pt is at high risk given SEVERE CHRONIC MALNUTRITION, severely underweight with prolonged poor po intake.   Kerman Passey MS, RD, [place], [place] [phone] Pager  [phone] Weekend/On-Call Pager

## 2018-06-20 NOTE — Progress Notes (Signed)
PROGRESS NOTE    Russell Williamson  HYQ:[id number] DOB: 1956-06-15 DOA: 06/09/2018 PCP: Harvie Junior, MD    Brief Narrative:   62 y.o.malewith a hx ofHTN, COPD, GERD, depression, anemia, chronic pancreatitis, tonsillar cancer (radiation therapy in remission), tobacco abuse, cocaine abuse, and GERD who presented with altered mental status, cough, shortness of breath and abdominal pain. He was admitted for sepsis from aspiration pneumonia.    Assessment & Plan:   Principal Problem:   Hypoglycemia Active Problems:   HTN (hypertension)   Pancytopenia (HCC)   Chronic alcoholic pancreatitis (HCC)   Protein-calorie malnutrition, severe   COPD with chronic bronchitis (HCC)   Tobacco abuse   Sepsis ([place])   Acute metabolic encephalopathy   Aspiration pneumonia (HCC)   Lobar pneumonia (HCC)   Abnormal LFTs   Abdominal pain   Failure to thrive (0-17)   Advance care planning   Sepsis from aspiration pneumonia:  Appears to have resolved.   SLP eval recommending dysphagia 1 diet , with alternate means for nutrition like PEG tube. IR consulted for PEG tube placement. He underwent PEG placement on 9/16. Meanwhile he completed 8 days of IV Unasyn.  He denies sob or chest pain. He continues to require 2 lit of [place] oxygen. He continues to have congested cough. Will get repeat CXR to evaluate for resolution of the pneumonia.    Severe Hypothyroidism:  TSH >25. On IV synthroid.  Monitor thyroid panel in 4 weeks.  TSH Improved from 21 to 11.    Chronic pancreatitis with cachexia and severe protein calorie malnutrition:  abd pain improving. Nausea is persistent and pt requesting anti emetics. Resume pancreatic enzyme supplementation.  Megace was added for appetite stimulation increased it to 800 mg /day. Since his oral nutrition is poor, PEG tube ordered. Peg Tube placed on 9/16. Nutrition consulted today to start tube feeds.  Resume Mirtazapine too.    Persistent Hypokalemia;  replaced with IV potassium and oral potassium, requiring inpatient stay.    Hypomagnesemia replaced.    GERD; on PPI and carafate. No changes in medications.    Substance abuse with cocaine and tobacco abuse: Counseled and SW consulted for resources. PT recommended SNF, but pt wants to go home.    Persistent hypomagnesemia and hypophosphatemia:  Due to poor intake/ supplementation provided and repeat levels wnl.  Continue to encourage oral intake.    Hypothermia and hypoglycemia; probably secondary to sepsis and hypothyroidism.  Improved.     Elevated liver function tests: Improving, ? Hypotension? Shock liver on admission. Improving.   Failure ot thrive: Despite multiple measures, pt denies eating and repeatedly is hypoglycemic. Palliative care consulted, now going forward with full aggressive care.      DVT prophylaxis: Lovenox.  Code Status:  FULL CODE.  Family Communication: none at bedside.  Disposition Plan: possible d/c home tomorrow if his electrolytes improve.   Consultants:   IR  Physical therapy.   slp  Procedures: PEG placement on 9/16   Antimicrobials:  Completed Unasyn.  Subjective: Reports feeling weak, and abd pain is tolerable.   Objective: Vitals:   06/19/18 2040 06/20/18 0427 06/20/18 0935 06/20/18 1749  BP: 102/79 138/80 128/76 123/82  Pulse: 94 95 90 88  Resp: 18 18 18 18   Temp: 98.1 F (36.7 C) (!) 97.5 F (36.4 C) 97.7 F (36.5 C) 98.2 F (36.8 C)  TempSrc: Oral Oral Oral Oral  SpO2: 96% 99% 98% 98%  Weight:      Height:  Intake/Output Summary (Last 24 hours) at 06/20/2018 1918 Last data filed at 06/20/2018 1700 Gross per 24 hour  Intake 1554.09 ml  Output 1375 ml  Net 179.09 ml   Filed Weights   06/09/18 2245 06/14/18 0510  Weight: 39.9 kg 36.9 kg    Examination: General exam: cachetic looking gentleman. On [place] oxygen. Not in distress.   Respiratory system: diminished at bases. No wheezing or rhonchi.    Cardiovascular system: S1 & S2 heard, RRR. No JVD, murmurs, Gastrointestinal system: Abdomen is soft some tenderness in the epigastric area.  Central nervous system: Alert and oriented. Non focal.  Extremities: poor muscle tone. No pedal edema.  Psychiatry: Mood & affect appropriate.     Data Reviewed: I have personally reviewed following labs and imaging studies  CBC: Recent Labs  Lab 06/14/18 0344 06/15/18 0429 06/16/18 0609 06/17/18 0637 06/18/18 0713  WBC 3.6* 4.8 8.3 7.4 6.1  HGB 10.5* 11.0* 10.4* 9.7* 8.4*  HCT 32.0* 33.6* 31.7* 30.1* 26.7*  MCV 103.2* 104.7* 106.0* 104.9* 109.0*  PLT 105* 144* 159 164 503*   Basic Metabolic Panel: Recent Labs  Lab 06/16/18 0609 06/17/18 0637 06/18/18 0713 06/18/18 1757 06/19/18 0542 06/19/18 1209 06/20/18 0642 06/20/18 1706  NA 142 144 144  --  135  --  131*  --   K 3.2* 3.2* 2.6* 2.7* 3.2*  --  2.8*  --   CL 94* 97* 99  --  93*  --  88*  --   CO2 36* 37* 33*  --  31  --  34*  --   GLUCOSE 117* 33* 146*  --  188*  --  156*  --   BUN 11 14 13   --  7*  --  5*  --   CREATININE 0.45* 0.44* 0.46*  --  0.35*  --  0.43*  --   CALCIUM 8.2* 8.4* 7.3*  --  7.3*  --  7.5*  --   MG 2.1 1.7 1.3*  --   --  1.4* 1.3* 2.6*  PHOS 3.0 2.8 2.4*  --   --   --  2.6 2.8   GFR: Estimated Creatinine Clearance: 50 mL/min (A) (by C-G formula based on SCr of 0.43 mg/dL (L)). Liver Function Tests: Recent Labs  Lab 06/15/18 0429 06/16/18 0609 06/17/18 0637 06/18/18 0713 06/20/18 0642  AST 81* 73* 86* 188* 56*  ALT 83* 77* 72* 119* 73*  ALKPHOS 163* 154* 166* 185* 150*  BILITOT 0.6 0.5 0.8 0.5 0.5  PROT 6.8 6.8 6.7 5.7* 5.9*  ALBUMIN 2.6* 2.6* 2.5* 2.0* 1.9*   No results for input(s): LIPASE, AMYLASE in the last 168 hours. No results for input(s): AMMONIA in the last 168 hours. Coagulation Profile: Recent Labs  Lab 06/14/18 0803  INR 1.24   Cardiac Enzymes: No results for input(s): CKTOTAL, CKMB, CKMBINDEX, TROPONINI in the last 168  hours. BNP (last 3 results) No results for input(s): PROBNP in the last 8760 hours. HbA1C: No results for input(s): HGBA1C in the last 72 hours. CBG: Recent Labs  Lab 06/20/18 0008 06/20/18 0426 06/20/18 0727 06/20/18 1134 06/20/18 1630  GLUCAP 167* 119* 170* 102* 76   Lipid Profile: No results for input(s): CHOL, HDL, LDLCALC, TRIG, CHOLHDL, LDLDIRECT in the last 72 hours. Thyroid Function Tests: Recent Labs    06/18/18 0949  TSH 11.138*   Anemia Panel: No results for input(s): VITAMINB12, FOLATE, FERRITIN, TIBC, IRON, RETICCTPCT in the last 72 hours. Sepsis Labs: No results for input(s):  PROCALCITON, LATICACIDVEN in the last 168 hours.  Recent Results (from the past 240 hour(s))  MRSA PCR Screening     Status: None   Collection Time: 06/13/18  8:00 PM  Result Value Ref Range Status   MRSA by PCR NEGATIVE NEGATIVE Final    Comment:        The GeneXpert MRSA Assay (FDA approved for NASAL specimens only), is one component of a comprehensive MRSA colonization surveillance program. It is not intended to diagnose MRSA infection nor to guide or monitor treatment for MRSA infections. Performed at [facility], [place] [address], [place], [place] [address]          Radiology Studies: Ir Gastrostomy Tube Mod Sed  Result Date: 06/19/2018 INDICATION: 62 year old male with failure to thrive. Request for percutaneous gastrostomy tube placement EXAM: PERCUTANEOUS GASTROSTOMY TUBE WITH FLUOROSCOPIC GUIDANCE Physician: Stephan Minister. Anselm Pancoast, MD MEDICATIONS: Inpatient receiving scheduled IV antibiotics.  Glucagon 1 mg IV ANESTHESIA/SEDATION: Versed 1 mg IV; Fentanyl 25 mcg IV Moderate Sedation Time:  26 minutes The patient was continuously monitored during the procedure by the interventional radiology nurse under my direct supervision. FLUOROSCOPY TIME:  Fluoroscopy Time: 6 minutes and 42 seconds, 14 mGy COMPLICATIONS: None immediate. PROCEDURE: Informed consent was obtained for a  percutaneous gastrostomy tube. The patient was placed on the interventional table. An orogastric tube was placed with fluoroscopic guidance. The anterior abdomen was prepped and draped in sterile fashion. Maximal barrier sterile technique was utilized including caps, mask, sterile gowns, sterile gloves, sterile drape, hand hygiene and skin antiseptic. Stomach was inflated with air through the orogastric tube. The skin and subcutaneous tissues were anesthetized with 1% lidocaine. A 17 gauge needle was directed into the distended stomach with fluoroscopic guidance. Needle placement was difficult due to the shape of the stomach. Placement within the stomach was confirmed using a lateral projection and contrast injection. A wire was advanced into the stomach. A 9-French vascular sheath was placed and the snare device went directly into the esophagus and advanced to the patient's mouth. This snare device was pulled out of the patient's mouth. The snare device was connected to a 20-French gastrostomy tube. The snare device and gastrostomy tube were pulled through the patient's mouth and out the anterior abdominal wall. The gastrostomy tube was cut to an appropriate length. Contrast injection through gastrostomy tube confirmed placement within the stomach. Fluoroscopic images were obtained for documentation. The gastrostomy tube was flushed with normal saline. IMPRESSION: Successful fluoroscopic guided percutaneous gastrostomy tube placement. Electronically Signed   By: Markus Daft M.D.   On: 06/19/2018 11:42        Scheduled Meds: . collagenase   Topical Daily  . [START ON 6/60/6301] folic acid  1 mg Per Tube Daily  . [START ON 06/21/2018] levothyroxine  100 mcg Per Tube QAC breakfast  . lipase/protease/amylase  12,000 Units Oral TID WC & HS  . [START ON 06/21/2018] megestrol  800 mg Per Tube Daily  . mirtazapine  30 mg Oral QHS  . [START ON 06/21/2018] multivitamin with minerals  1 tablet Per Tube Daily  .  nicotine  21 mg Transdermal Daily  . pantoprazole  20 mg Oral BID  . phosphorus  500 mg Per Tube TID WC & HS  . potassium chloride  40 mEq Oral BID  . sucralfate  1 g Per Tube TID WC & HS   Continuous Infusions: . sodium chloride Stopped (06/17/18 0601)  . feeding supplement (OSMOLITE 1.2 CAL) 1,000 mL (  06/20/18 1500)     LOS: 11 days    Time spent: 35 minutes.     Hosie Poisson, MD [facility] Pager [phone]  If 7PM-7AM, please contact night-coverage [email or web address] Password [facility] 06/20/2018, 7:18 PM

## 2018-06-21 ENCOUNTER — Inpatient Hospital Stay (HOSPITAL_COMMUNITY): Payer: Medicaid Other

## 2018-06-21 LAB — COMPREHENSIVE METABOLIC PANEL
ALT: 63 U/L — ABNORMAL HIGH (ref 0–44)
ANION GAP: 13 (ref 5–15)
AST: 43 U/L — ABNORMAL HIGH (ref 15–41)
Albumin: 2 g/dL — ABNORMAL LOW (ref 3.5–5.0)
Alkaline Phosphatase: 151 U/L — ABNORMAL HIGH (ref 38–126)
BUN: 7 mg/dL — ABNORMAL LOW (ref 8–23)
CHLORIDE: 94 mmol/L — AB (ref 98–111)
CO2: 27 mmol/L (ref 22–32)
Calcium: 7.8 mg/dL — ABNORMAL LOW (ref 8.9–10.3)
Creatinine, Ser: 0.35 mg/dL — ABNORMAL LOW (ref 0.61–1.24)
GFR calc Af Amer: >60 mL/min (ref >60)
GFR calc non Af Amer: >60 mL/min (ref >60)
GLUCOSE: 71 mg/dL (ref 70–99)
POTASSIUM: 4.1 mmol/L (ref 3.5–5.1)
SODIUM: 134 mmol/L — AB (ref 135–145)
Total Bilirubin: 0.6 mg/dL (ref 0.3–1.2)
Total Protein: 6 g/dL — ABNORMAL LOW (ref 6.5–8.1)

## 2018-06-21 LAB — CBC
HCT: 22.5 % — ABNORMAL LOW (ref 39.0–52.0)
HEMOGLOBIN: 7.9 g/dL — AB (ref 13.0–17.0)
MCH: 34.6 pg — ABNORMAL HIGH (ref 26.0–34.0)
MCHC: 35.1 g/dL (ref 30.0–36.0)
MCV: 98.7 fL (ref 78.0–100.0)
Platelets: 221 10*3/uL (ref 150–400)
RBC: 2.28 MIL/uL — AB (ref 4.22–5.81)
RDW: 14.7 % (ref 11.5–15.5)
WBC: 8 10*3/uL (ref 4.0–10.5)

## 2018-06-21 LAB — GLUCOSE, CAPILLARY
GLUCOSE-CAPILLARY: 80 mg/dL (ref 70–99)
GLUCOSE-CAPILLARY: 86 mg/dL (ref 70–99)
GLUCOSE-CAPILLARY: 86 mg/dL (ref 70–99)
GLUCOSE-CAPILLARY: 102 mg/dL — AB (ref 70–99)
Glucose-Capillary: 118 mg/dL — ABNORMAL HIGH (ref 70–99)
Glucose-Capillary: 77 mg/dL (ref 70–99)

## 2018-06-21 LAB — PHOSPHORUS
PHOSPHORUS: 3.7 mg/dL (ref 2.5–4.6)
Phosphorus: 4.2 mg/dL (ref 2.5–4.6)

## 2018-06-21 LAB — MAGNESIUM
MAGNESIUM: 1.8 mg/dL (ref 1.7–2.4)
Magnesium: 1.7 mg/dL (ref 1.7–2.4)

## 2018-06-21 MED ORDER — ACETAMINOPHEN 325 MG PO TABS: 650.0000 mg | ORAL_TABLET | Freq: Four times a day (QID) | ORAL | Status: DC | PRN

## 2018-06-21 MED ORDER — MAGNESIUM OXIDE 400 (241.3 MG) MG PO TABS
800.0000 mg | ORAL_TABLET | Freq: Two times a day (BID) | ORAL | Status: DC
  Administered 2018-06-21 – 2018-06-24 (×7): 800 mg via ORAL
  Filled 2018-06-21 (×8): qty 2

## 2018-06-21 MED ORDER — OSMOLITE 1.2 CAL PO LIQD
1000.0000 mL | ORAL | Status: DC
  Administered 2018-06-22: 1000 mL
  Filled 2018-06-21 (×2): qty 1000

## 2018-06-21 NOTE — Progress Notes (Signed)
Occupational Therapy Treatment Patient Details Name: Russell Williamson MRN: [id number] DOB: Jan 09, 1956 Today's Date: 06/21/2018    History of present illness Pt is a 62 y.o. male with PMH of HTN, COPD, GERD, depression, anemia, chronic pancreatitis, tonsillar cancer (radiation therapy in remission), tobacco abuse, cocaine abuse, GERD, depression, who presents 06/09/18 with AMS, cough, SOB and abdominal pain. Worked up for sepsis with right lower lobe pneumonia, likely aspiration.   OT comments  Pt continues to present with fatigue and decreased balance. Pt performing stand pivot to recliner with Min A and RW. Pt requiring Mod A for toilet hygiene after BM in bed. Pt requiring Max A for donning socks. Pt slow moving and requiring increased time throughout session. Presenting with orthostatic hypotension; see general comments for BP. Continue to recommend dc to SNF and will continue to follow acutely as admitted.    Follow Up Recommendations  SNF;Supervision/Assistance - 24 hour    Equipment Recommendations  3 in 1 bedside commode;Tub/shower seat    Recommendations for Other Services      Precautions / Restrictions Precautions Precautions: Fall Precaution Comments: Orthostatic BP Restrictions Weight Bearing Restrictions: No       Mobility Bed Mobility Overal bed mobility: Needs Assistance Bed Mobility: Supine to Sit     Supine to sit: Min assist     General bed mobility comments: Min handheld assist to elevate trunk to sit  Transfers Overall transfer level: Needs assistance Equipment used: Rolling walker (2 wheeled) Transfers: Sit to/from Stand Sit to Stand: Min assist         General transfer comment: Cues for hand placement and safety; Dependent on UEs to push to rise; cues to self-monitor for activity tolerance    Balance Overall balance assessment: Needs assistance Sitting-balance support: Single extremity supported;Feet supported Sitting balance-Leahy Scale:  Fair     Standing balance support: Bilateral upper extremity supported;During functional activity Standing balance-Leahy Scale: Poor Standing balance comment: Reliance on UE support                           ADL either performed or assessed with clinical judgement   ADL Overall ADL's : Needs assistance/impaired                     Lower Body Dressing: Maximal assistance;Sit to/from stand Lower Body Dressing Details (indicate cue type and reason): Max A to don socks. Min A for sit<>stand and then maintain balance Toilet Transfer: Minimal assistance;Stand-pivot;RW Toilet Transfer Details (indicate cue type and reason): Simulated to recliner with Min A for balance Toileting- Clothing Manipulation and Hygiene: Moderate assistance;Sit to/from stand Toileting - Clothing Manipulation Details (indicate cue type and reason): Pt able to initiate toilet hygiene after BM in bed. Pt requiring assistance to make sure he is clean. Min A for standing balacne     Functional mobility during ADLs: Minimal assistance;Rolling walker;Cueing for safety(stand pivot only) General ADL Comments: Continues to present with decreased balance and activity tolerance.     Vision       Perception     Praxis      Cognition Arousal/Alertness: Awake/alert Behavior During Therapy: Flat affect Overall Cognitive Status: No family/caregiver present to determine baseline cognitive functioning Area of Impairment: Safety/judgement;Problem solving                         Safety/Judgement: Decreased awareness of deficits;Decreased awareness of safety   Problem Solving: Slow  processing;Decreased initiation;Difficulty sequencing;Requires verbal cues;Requires tactile cues General Comments: Slow processing and requires increased time and cues        Exercises     Shoulder Instructions       General Comments BP supine in bed 131/76; standing 97/77; and sitting in recliner with BLE  elevated 119/88    Pertinent Vitals/ Pain       Pain Assessment: Faces Faces Pain Scale: Hurts even more Pain Location: site of peg placement on abdomen Pain Descriptors / Indicators: Grimacing Pain Intervention(s): Monitored during session;Limited activity within patient's tolerance;Repositioned  Home Living                                          Prior Functioning/Environment              Frequency  Min 2X/week        Progress Toward Goals  OT Goals(current goals can now be found in the care plan section)  Progress towards OT goals: Progressing toward goals  Acute Rehab OT Goals Patient Stated Goal: Did not state, but agreeable to OOB to chair OT Goal Formulation: With patient Time For Goal Achievement: 06/28/18 Potential to Achieve Goals: Good ADL Goals Pt Will Perform Grooming: with supervision;with set-up;standing Pt Will Perform Upper Body Bathing: with set-up;with supervision;sitting Pt Will Perform Lower Body Bathing: with mod assist;sitting/lateral leans;sit to/from stand;with caregiver independent in assisting Pt Will Perform Upper Body Dressing: with set-up;with supervision;sitting Pt Will Perform Lower Body Dressing: with mod assist;sitting/lateral leans;sit to/from stand;with caregiver independent in assisting Pt Will Transfer to Toilet: with min guard assist;with supervision;ambulating Pt Will Perform Toileting - Clothing Manipulation and hygiene: with min assist;sit to/from stand Pt Will Perform Tub/Shower Transfer: with min guard assist;with supervision;ambulating;shower seat;grab bars;3 in 1;rolling walker  Plan Discharge plan remains appropriate    Co-evaluation                 AM-PAC PT "6 Clicks" Daily Activity     Outcome Measure   Help from another person eating meals?: None Help from another person taking care of personal grooming?: A Little Help from another person toileting, which includes using toliet, bedpan,  or urinal?: A Lot Help from another person bathing (including washing, rinsing, drying)?: A Lot Help from another person to put on and taking off regular upper body clothing?: A Little Help from another person to put on and taking off regular lower body clothing?: A Lot 6 Click Score: 16    End of Session Equipment Utilized During Treatment: Rolling walker;Gait belt  OT Visit Diagnosis: Unsteadiness on feet (R26.81);Other abnormalities of gait and mobility (R26.89);Muscle weakness (generalized) (M62.81)   Activity Tolerance Patient limited by pain;Patient limited by fatigue   Patient Left in chair;with call bell/phone within reach   Nurse Communication Mobility status;Patient requests pain meds        Time: 5631-4970 OT Time Calculation (min): 34 min  Charges: OT General Charges $OT Visit: 1 Visit OT Treatments $Self Care/Home Management : 23-37 mins  [place], OTR/L Acute Rehab Pager: [phone] Office: [place] 06/21/2018, 5:39 PM

## 2018-06-21 NOTE — Progress Notes (Signed)
Nutrition Follow-up  DOCUMENTATION CODES:   Severe malnutrition in context of chronic illness, Underweight  INTERVENTION:   Tube Feeding:  Recommend increasing Osmolite 1.2 by 10 mL q 12 hours until goal rate of 55 ml/hr  Once pt demonstrating tolerance of TF at goal rate; recommend transitioning to bolus feedings. Creon will need to be dosed with each bolus feeding Bolus TF regimen: Osmolite 1.5 1 can (237 mL) QID (provides 1420 kcals, 60 g of protein and 768 mL of free water. Provided additional 250 mL TID of free ater via G-tube   NUTRITION DIAGNOSIS:   Severe Malnutrition related to chronic illness(COPD) as evidenced by severe fat depletion, severe muscle depletion.  Being addressed via TF   GOAL:   Patient will meet greater than or equal to 90% of their needs  Progressing  MONITOR:   PO intake, Supplement acceptance, Labs, Skin  REASON FOR ASSESSMENT:   Consult Assessment of nutrition requirement/status  ASSESSMENT:   62 yo male with PMH of HTN, COPD, GERD, anemia, chronic pancreatitis, tonsillar cancer, tobacco & cocaine abuse, who was admitted on 9/6 with AMS, SOB, hypoglycemia.  Tolerating Osmolite 1.2 @ 15 ml/hr via G-tube  Continues on Dysphagia I, Honey Thick Diet; recorded po intake 0% of meals  D10 infusion stopped post initiation of enteral nutrition; no episodes of hypoglycemia noted.   Electrolytes currently wdl, being aggressively supplemented  Labs: sodium 134, potassium 4.1 (wdl), phosphorus 3.7 (wdl), magnesium 1.8 (wdl); CBGs 76-118 Meds: MVI with minerals, remeron, megace, Creon, K phos neutral, potssium chloride, mag sulfate, mag ox, folic acid  Diet Order:   Diet Order            DIET - DYS 1 Room service appropriate? Yes; Fluid consistency: Honey Thick  Diet effective now              EDUCATION NEEDS:   No education needs have been identified at this time  Skin:  Skin Assessment: Skin Integrity Issues: Skin Integrity Issues::  Unstageable DTI: R & L heel Stage II: n/a Unstageable: sacrum  Last BM:  9/17  Height:   Ht Readings from Last 1 Encounters:  06/09/18 5\' 5"  (1.651 m)    Weight:   Wt Readings from Last 1 Encounters:  06/14/18 36.9 kg    Ideal Body Weight:  61.8 kg  BMI:  Body mass index is 13.54 kg/m.  Estimated Nutritional Needs:   Kcal:  1400-1700 kcals   Protein:  60-80 gm  Fluid:  1.5 L   Kerman Passey MS, RD, LDN, CNSC [phone] Pager  [phone] Weekend/On-Call Pager

## 2018-06-21 NOTE — Progress Notes (Signed)
PROGRESS NOTE    Russell Williamson  TGG:[id number] DOB: 04-01-1956 DOA: 06/09/2018 PCP: Harvie Junior, MD  Brief Narrative:62 y.o.malewith a hx ofHTN, COPD, GERD, depression, anemia, chronic pancreatitis, tonsillar cancer (radiation therapy in remission), tobacco abuse, cocaine abuse, and GERD who presented with altered mental status, cough, shortness of breath and abdominal pain. He was admitted for sepsis from aspiration pneumonia.   Assessment & Plan:   Principal Problem:   Hypoglycemia Active Problems:   HTN (hypertension)   Pancytopenia (HCC)   Chronic alcoholic pancreatitis (HCC)   Protein-calorie malnutrition, severe   COPD with chronic bronchitis (HCC)   Tobacco abuse   Sepsis ([place])   Acute metabolic encephalopathy   Aspiration pneumonia (HCC)   Lobar pneumonia (HCC)   Abnormal LFTs   Abdominal pain   Failure to thrive (0-17)   Advance care planning  Sepsis from aspiration pneumonia:  Appears to have resolved.   SLP eval recommending dysphagia 1 diet , with alternate means for nutrition like PEG tube. IR consulted for PEG tube placement. He underwent PEG placement on 9/16. Meanwhile he completed 8 days of IV Unasyn.  He denies sob or chest pain. He continues to require 2 lit of [place] oxygen. He continues to have congested cough. Will get repeat CXR to evaluate for resolution of the pneumonia.    Severe Hypothyroidism:  TSH >25. On IV synthroid.  Monitor thyroid panel in 4 weeks.  TSH Improved from 21 to 11.    Chronic pancreatitis with cachexia and severe protein calorie malnutrition:  abd pain improving. Nausea is persistent and pt requesting anti emetics. Resume pancreatic enzyme supplementation.  Megace was added for appetite stimulation increased it to 800 mg /day. Since his oral nutrition is poor, PEG tube ordered. Peg Tube placed on 9/16. Nutrition consulted today to start tube feeds.  Tube feeds were started yesterday currently at 15 cc the goal is 55  cc/h.  Dietary recommending slow titration due to high risk for refeeding. Resume Mirtazapine too.   Persistent Hypokalemia; replaced with IV potassium levels normal today.  Hypomagnesemia replace   GERD; on PPI and carafate. No changes in medications.    Substance abuse with cocaine and tobacco abuse: Counseled and SW consulted for resources. PT recommended SNF, but pt wants to go home.    Persistent hypomagnesemia and hypophosphatemia:  Due to poor intake/ supplementation provided and repeat levels wnl.  Continue to encourage oral intake.    Hypothermia and hypoglycemia; probably secondary to sepsis and hypothyroidism.  Improved.     Elevated liver function tests: Improving, ? Hypotension? Shock liver on admission. Improving.   Failure ot thrive: Despite multiple measures, pt denies eating and repeatedly is hypoglycemic. Palliative care consulted, now going forward with full aggressive care.   DVT prophylaxis: lovenox Code Status full Family Communication:none Disposition Plan: Patient just started on tube feeds yesterday.  He had a PEG tube placed 06/19/2018.  Feeding was started 06/20/2018.  He is only at the rate of 15 cc an hour today.  Slow titration recommended due to high risk for refeeding syndrome.  The goal is to get him to 50 cc an hour.  He probably will need at least 3 to 4 days to achieve this goal prior to discharge.   Consultants: Interventional radiology   Procedures: Placement 06/19/2018 Antimicrobials: None Subjective: Resting in bed reports that he wants to go home he does not want to go to SNF and he is asking for the port to be accessible when  he gets home.  Objective: Vitals:   06/20/18 1749 06/20/18 2023 06/21/18 0429 06/21/18 0700  BP: 123/82 118/73 139/85 140/70  Pulse: 88 76 94 90  Resp: 18 18 18 18   Temp: 98.2 F (36.8 C) 99.9 F (37.7 C) 97.8 F (36.6 C) 97.8 F (36.6 C)  TempSrc: Oral Oral  Oral  SpO2: 98% 92%  93%    Weight:      Height:        Intake/Output Summary (Last 24 hours) at 06/21/2018 1213 Last data filed at 06/21/2018 0900 Gross per 24 hour  Intake 300 ml  Output 1150 ml  Net -850 ml   Filed Weights   06/09/18 2245 06/14/18 0510  Weight: 39.9 kg 36.9 kg    Examination: Frail chronically ill looking cachectic male in no acute distress  General exam: Appears calm and comfortable  Respiratory system: diminished  auscultation. Respiratory effort normal. Cardiovascular system: S1 & S2 heard, RRR. No JVD, murmurs, rubs, gallops or clicks. No pedal edema. Gastrointestinal system: Abdomen is nondistended, soft and nontender. No organomegaly or masses felt. Normal bowel sounds heard.PEG in place Central nervous system: Alert and oriented. No focal neurological deficits. Extremities: Symmetric 5 x 5 power. Skin: No rashes, lesions or ulcers Psychiatry: Judgement and insight appear normal. Mood & affect appropriate.     Data Reviewed: I have personally reviewed following labs and imaging studies  CBC: Recent Labs  Lab 06/15/18 0429 06/16/18 0609 06/17/18 0637 06/18/18 0713 06/21/18 0859  WBC 4.8 8.3 7.4 6.1 8.0  HGB 11.0* 10.4* 9.7* 8.4* 7.9*  HCT 33.6* 31.7* 30.1* 26.7* 22.5*  MCV 104.7* 106.0* 104.9* 109.0* 98.7  PLT 144* 159 164 134* 532   Basic Metabolic Panel: Recent Labs  Lab 06/17/18 0637 06/18/18 0713 06/18/18 1757 06/19/18 0542 06/19/18 1209 06/20/18 0642 06/20/18 1706 06/21/18 0706  NA 144 144  --  135  --  131*  --  134*  K 3.2* 2.6* 2.7* 3.2*  --  2.8*  --  4.1  CL 97* 99  --  93*  --  88*  --  94*  CO2 37* 33*  --  31  --  34*  --  27  GLUCOSE 33* 146*  --  188*  --  156*  --  71  BUN 14 13  --  7*  --  5*  --  7*  CREATININE 0.44* 0.46*  --  0.35*  --  0.43*  --  0.35*  CALCIUM 8.4* 7.3*  --  7.3*  --  7.5*  --  7.8*  MG 1.7 1.3*  --   --  1.4* 1.3* 2.6* 1.8  PHOS 2.8 2.4*  --   --   --  2.6 2.8 3.7   GFR: Estimated Creatinine Clearance: 50 mL/min  (A) (by C-G formula based on SCr of 0.35 mg/dL (L)). Liver Function Tests: Recent Labs  Lab 06/16/18 0609 06/17/18 0637 06/18/18 0713 06/20/18 0642 06/21/18 0706  AST 73* 86* 188* 56* 43*  ALT 77* 72* 119* 73* 63*  ALKPHOS 154* 166* 185* 150* 151*  BILITOT 0.5 0.8 0.5 0.5 0.6  PROT 6.8 6.7 5.7* 5.9* 6.0*  ALBUMIN 2.6* 2.5* 2.0* 1.9* 2.0*   No results for input(s): LIPASE, AMYLASE in the last 168 hours. No results for input(s): AMMONIA in the last 168 hours. Coagulation Profile: No results for input(s): INR, PROTIME in the last 168 hours. Cardiac Enzymes: No results for input(s): CKTOTAL, CKMB, CKMBINDEX, TROPONINI in the last  168 hours. BNP (last 3 results) No results for input(s): PROBNP in the last 8760 hours. HbA1C: No results for input(s): HGBA1C in the last 72 hours. CBG: Recent Labs  Lab 06/20/18 2020 06/21/18 0010 06/21/18 0429 06/21/18 0738 06/21/18 1112  GLUCAP 91 118* 77 80 86   Lipid Profile: No results for input(s): CHOL, HDL, LDLCALC, TRIG, CHOLHDL, LDLDIRECT in the last 72 hours. Thyroid Function Tests: No results for input(s): TSH, T4TOTAL, FREET4, T3FREE, THYROIDAB in the last 72 hours. Anemia Panel: No results for input(s): VITAMINB12, FOLATE, FERRITIN, TIBC, IRON, RETICCTPCT in the last 72 hours. Sepsis Labs: No results for input(s): PROCALCITON, LATICACIDVEN in the last 168 hours.  Recent Results (from the past 240 hour(s))  MRSA PCR Screening     Status: None   Collection Time: 06/13/18  8:00 PM  Result Value Ref Range Status   MRSA by PCR NEGATIVE NEGATIVE Final    Comment:        The GeneXpert MRSA Assay (FDA approved for NASAL specimens only), is one component of a comprehensive MRSA colonization surveillance program. It is not intended to diagnose MRSA infection nor to guide or monitor treatment for MRSA infections. Performed at [facility], [place] [address], [place], [place] [address]          Radiology Studies: Dg  Chest Port 1 View  Result Date: 06/21/2018 CLINICAL DATA:  Cough, COPD, follow-up pneumonia EXAM: PORTABLE CHEST 1 VIEW COMPARISON:  06/09/2018 chest radiograph. FINDINGS: Stable cardiomediastinal silhouette with normal heart size. No pneumothorax. Small to moderate right pleural effusion, increased. Stable trace left pleural effusion. Emphysema. No pulmonary edema. Patchy opacities at the right greater than left lung bases, worsened on the right and stable on the left. IMPRESSION: 1. Small to moderate right pleural effusion, increased. Trace left pleural effusion, stable. 2. Patchy opacities at the right greater than left lung bases, worsened on the right and stable on the left, which could represent atelectasis, aspiration or pneumonia. Follow-up chest radiographs to resolution advised. 3. Emphysema. Electronically Signed   By: Ilona Sorrel M.D.   On: 06/21/2018 10:17        Scheduled Meds: . collagenase   Topical Daily  . folic acid  1 mg Per Tube Daily  . levothyroxine  100 mcg Per Tube QAC breakfast  . lipase/protease/amylase  12,000 Units Oral TID WC & HS  . megestrol  800 mg Per Tube Daily  . mirtazapine  30 mg Oral QHS  . multivitamin with minerals  1 tablet Per Tube Daily  . nicotine  21 mg Transdermal Daily  . pantoprazole  20 mg Oral BID  . phosphorus  500 mg Per Tube TID WC & HS  . sucralfate  1 g Per Tube TID WC & HS   Continuous Infusions: . sodium chloride Stopped (06/17/18 0601)  . feeding supplement (OSMOLITE 1.2 CAL) 1,000 mL (06/20/18 1500)     LOS: 12 days        Georgette Shell, MD [facility]  If 7PM-7AM, please contact night-coverage [email or web address] Password [facility] 06/21/2018, 12:13 PM

## 2018-06-22 ENCOUNTER — Inpatient Hospital Stay (HOSPITAL_COMMUNITY): Payer: Medicaid Other

## 2018-06-22 LAB — BASIC METABOLIC PANEL
ANION GAP: 15 (ref 5–15)
BUN: 8 mg/dL (ref 8–23)
CHLORIDE: 88 mmol/L — AB (ref 98–111)
CO2: 29 mmol/L (ref 22–32)
Calcium: 7.8 mg/dL — ABNORMAL LOW (ref 8.9–10.3)
Creatinine, Ser: 0.44 mg/dL — ABNORMAL LOW (ref 0.61–1.24)
GFR calc non Af Amer: >60 mL/min (ref >60)
Glucose, Bld: 116 mg/dL — ABNORMAL HIGH (ref 70–99)
Potassium: 3.3 mmol/L — ABNORMAL LOW (ref 3.5–5.1)
SODIUM: 132 mmol/L — AB (ref 135–145)

## 2018-06-22 LAB — CBC
HCT: 24.6 % — ABNORMAL LOW (ref 39.0–52.0)
HEMOGLOBIN: 8.2 g/dL — AB (ref 13.0–17.0)
MCH: 34.6 pg — AB (ref 26.0–34.0)
MCHC: 33.3 g/dL (ref 30.0–36.0)
MCV: 103.8 fL — AB (ref 78.0–100.0)
Platelets: 229 10*3/uL (ref 150–400)
RBC: 2.37 MIL/uL — AB (ref 4.22–5.81)
RDW: 14.9 % (ref 11.5–15.5)
WBC: 6.6 10*3/uL (ref 4.0–10.5)

## 2018-06-22 LAB — GLUCOSE, CAPILLARY
Glucose-Capillary: 101 mg/dL — ABNORMAL HIGH (ref 70–99)
Glucose-Capillary: 28 mg/dL — CL (ref 70–99)
Glucose-Capillary: 71 mg/dL (ref 70–99)
Glucose-Capillary: 77 mg/dL (ref 70–99)
Glucose-Capillary: 83 mg/dL (ref 70–99)
Glucose-Capillary: 94 mg/dL (ref 70–99)
Glucose-Capillary: 94 mg/dL (ref 70–99)

## 2018-06-22 LAB — PHOSPHORUS: Phosphorus: 3.5 mg/dL (ref 2.5–4.6)

## 2018-06-22 LAB — MAGNESIUM: Magnesium: 1.5 mg/dL — ABNORMAL LOW (ref 1.7–2.4)

## 2018-06-22 MED ORDER — MORPHINE SULFATE (PF) 2 MG/ML IV SOLN: 1.0000 mg | Freq: Once | INTRAVENOUS | Status: DC

## 2018-06-22 MED ORDER — MAGNESIUM SULFATE 4 GM/100ML IV SOLN
4.0000 g | Freq: Once | INTRAVENOUS | Status: AC
  Administered 2018-06-22: 4 g via INTRAVENOUS
  Filled 2018-06-22: qty 100

## 2018-06-22 MED ORDER — HYDROMORPHONE HCL 1 MG/ML IJ SOLN
0.5000 mg | Freq: Once | INTRAMUSCULAR | Status: AC
  Administered 2018-06-22: 0.5 mg via INTRAVENOUS
  Filled 2018-06-22: qty 1

## 2018-06-22 NOTE — Progress Notes (Signed)
Patient tube feed was stopped per MD Karleen Hampshire request. Patient abdomen is distended. DG of the Abd was done. Awaiting MD order to restart tube feed.

## 2018-06-22 NOTE — Progress Notes (Signed)
PT Cancellation Note  Patient Details Name: Pratham Cassatt MRN: [id number] DOB: 05-Apr-1956   Cancelled Treatment:    Reason Eval/Treat Not Completed: Patient at procedure or test/unavailable (adominal scan).  Ellamae Sia, PT, DPT Acute Rehabilitation Services Pager [phone] Office [phone]    Willy Eddy 06/22/2018, 2:57 PM

## 2018-06-22 NOTE — Progress Notes (Signed)
PROGRESS NOTE    Russell Williamson  NTI:[id number] DOB: 04-27-1956 DOA: 06/09/2018 PCP: Harvie Junior, MD    Brief Narrative:   62 y.o.malewith a hx ofHTN, COPD, GERD, depression, anemia, chronic pancreatitis, tonsillar cancer (radiation therapy in remission), tobacco abuse, cocaine abuse, and GERD who presented with altered mental status, cough, shortness of breath and abdominal pain. He was admitted for sepsis from aspiration pneumonia.    Assessment & Plan:   Principal Problem:   Hypoglycemia Active Problems:   HTN (hypertension)   Pancytopenia (HCC)   Chronic alcoholic pancreatitis (HCC)   Protein-calorie malnutrition, severe   COPD with chronic bronchitis (HCC)   Tobacco abuse   Sepsis ([place])   Acute metabolic encephalopathy   Aspiration pneumonia (HCC)   Lobar pneumonia (HCC)   Abnormal LFTs   Abdominal pain   Failure to thrive (0-17)   Advance care planning   Sepsis from aspiration pneumonia:  Appears to have resolved.   SLP eval recommending dysphagia 1 diet , with alternate means for nutrition like PEG tube. IR consulted for PEG tube placement. He underwent PEG placement on 9/16. Meanwhile he completed 8 days of IV Unasyn.  He denies sob or chest pain. He was weaned off oxygen. He has been afebrile and there is no leukocytosis. But CXR on 9/18 showed Patchy opacities at the right greater than left lung bases, worsened on the right and stable on the left, which could represent atelectasis, aspiration or pneumonia. Follow-up chest radiographs to resolution advised. Suspect he has atelectasis, will order incentive spirometry and PT evaluation recommending SNF, but pt continues to refuse SNF and wanted to go home.    Severe Hypothyroidism:  TSH >25. On IV synthroid.  Monitor thyroid panel in 4 weeks.  TSH Improved from 21 to 11.    Chronic pancreatitis with cachexia and severe protein calorie malnutrition:  abd pain improving. Nausea is persistent and pt  requesting anti emetics. Resume pancreatic enzyme supplementation.  Megace was added for appetite stimulation increased it to 800 mg /day. Since his oral nutrition is poor, PEG tube ordered and placed on 9/16. Nutrition consulted to start tube feeds. He was started on tube feeds slowly and with caution to prevent refeeding syndrome,. Today patient is at 24ml/hr tube feeds and started having abdominal distention, worsening abd pain and nausea. The tube feeds were stopped, and abdominal film ordered for evaluation of obstruction.  Resume Mirtazapine too.    Persistent Hypokalemia; replaced and repeat in am.    Hypomagnesemia replaced.    GERD; on PPI and carafate. No changes in medications.    Substance abuse with cocaine and tobacco abuse: Counseled and SW consulted for resources. PT recommended SNF, but pt wants to go home.    Persistent hypomagnesemia and hypophosphatemia:  Due to poor intake/ supplementation provided and repeat levels wnl.  Continue to encourage oral intake.    Hypothermia and hypoglycemia; probably secondary to sepsis and hypothyroidism.  Improved.     Elevated liver function tests: Improving, ? Hypotension? Shock liver on admission. Improving. Recommend checking liver function panel in 4 weeks.   Failure ot thrive: Despite multiple measures, pt denies eating and repeatedly is hypoglycemic. Palliative care consulted, now going forward with full aggressive care.      DVT prophylaxis: Lovenox.  Code Status:  FULL CODE.  Family Communication: none at bedside.  Disposition Plan: possible d/c home tomorrow if his electrolytes improve.   Consultants:   IR  Physical therapy.   slp  Procedures:  PEG placement on 9/16   Antimicrobials:  Completed Unasyn.   Subjective: Nausea, and abdominal pain severe, no vomiting yet.   Objective: Vitals:   06/21/18 1608 06/21/18 2043 06/22/18 0429 06/22/18 0739  BP: 132/84 108/87 (!) 117/91 137/82  Pulse: 71 99  80 78  Resp: 18 18 18 18   Temp: 97.8 F (36.6 C) 98.9 F (37.2 C) 98.5 F (36.9 C) 98.4 F (36.9 C)  TempSrc: Oral   Oral  SpO2: 94% 90% 91%   Weight:      Height:        Intake/Output Summary (Last 24 hours) at 06/22/2018 1251 Last data filed at 06/22/2018 0935 Gross per 24 hour  Intake 628.33 ml  Output 800 ml  Net -171.67 ml   Filed Weights   06/09/18 2245 06/14/18 0510  Weight: 39.9 kg 36.9 kg    Examination: General exam: cachetic looking gentleman in mild distress from abdominal pain.  Respiratory system: diminished at bases. No wheezing or rhonchi.  Cardiovascular system: S1 & S2 heard, RRR. No JVD, murmurs, Gastrointestinal system: Abdomen is soft distended, tender generalized, bowel sounds okay.  Central nervous system: Alert and oriented. Non focal.  Extremities: poor muscle tone. No pedal edema. multiple heel ulcers and sacral decubitus ulcer.  Psychiatry: Mood & affect appropriate.     Data Reviewed: I have personally reviewed following labs and imaging studies  CBC: Recent Labs  Lab 06/16/18 0609 06/17/18 0637 06/18/18 0713 06/21/18 0859 06/22/18 0835  WBC 8.3 7.4 6.1 8.0 6.6  HGB 10.4* 9.7* 8.4* 7.9* 8.2*  HCT 31.7* 30.1* 26.7* 22.5* 24.6*  MCV 106.0* 104.9* 109.0* 98.7 103.8*  PLT 159 164 134* 221 528   Basic Metabolic Panel: Recent Labs  Lab 06/18/18 0713 06/18/18 1757 06/19/18 0542  06/20/18 0642 06/20/18 1706 06/21/18 0706 06/21/18 2030 06/22/18 0657 06/22/18 0835  NA 144  --  135  --  131*  --  134*  --   --  132*  K 2.6* 2.7* 3.2*  --  2.8*  --  4.1  --   --  3.3*  CL 99  --  93*  --  88*  --  94*  --   --  88*  CO2 33*  --  31  --  34*  --  27  --   --  29  GLUCOSE 146*  --  188*  --  156*  --  71  --   --  116*  BUN 13  --  7*  --  5*  --  7*  --   --  8  CREATININE 0.46*  --  0.35*  --  0.43*  --  0.35*  --   --  0.44*  CALCIUM 7.3*  --  7.3*  --  7.5*  --  7.8*  --   --  7.8*  MG 1.3*  --   --    < > 1.3* 2.6* 1.8 1.7 1.5*   --   PHOS 2.4*  --   --   --  2.6 2.8 3.7 4.2 3.5  --    < > = values in this interval not displayed.   GFR: Estimated Creatinine Clearance: 50 mL/min (A) (by C-G formula based on SCr of 0.44 mg/dL (L)). Liver Function Tests: Recent Labs  Lab 06/16/18 0609 06/17/18 0637 06/18/18 0713 06/20/18 0642 06/21/18 0706  AST 73* 86* 188* 56* 43*  ALT 77* 72* 119* 73* 63*  ALKPHOS 154* 166* 185* 150*  151*  BILITOT 0.5 0.8 0.5 0.5 0.6  PROT 6.8 6.7 5.7* 5.9* 6.0*  ALBUMIN 2.6* 2.5* 2.0* 1.9* 2.0*   No results for input(s): LIPASE, AMYLASE in the last 168 hours. No results for input(s): AMMONIA in the last 168 hours. Coagulation Profile: No results for input(s): INR, PROTIME in the last 168 hours. Cardiac Enzymes: No results for input(s): CKTOTAL, CKMB, CKMBINDEX, TROPONINI in the last 168 hours. BNP (last 3 results) No results for input(s): PROBNP in the last 8760 hours. HbA1C: No results for input(s): HGBA1C in the last 72 hours. CBG: Recent Labs  Lab 06/21/18 2042 06/22/18 0037 06/22/18 0428 06/22/18 0736 06/22/18 1121  GLUCAP 102* 94 77 71 94   Lipid Profile: No results for input(s): CHOL, HDL, LDLCALC, TRIG, CHOLHDL, LDLDIRECT in the last 72 hours. Thyroid Function Tests: No results for input(s): TSH, T4TOTAL, FREET4, T3FREE, THYROIDAB in the last 72 hours. Anemia Panel: No results for input(s): VITAMINB12, FOLATE, FERRITIN, TIBC, IRON, RETICCTPCT in the last 72 hours. Sepsis Labs: No results for input(s): PROCALCITON, LATICACIDVEN in the last 168 hours.  Recent Results (from the past 240 hour(s))  MRSA PCR Screening     Status: None   Collection Time: 06/13/18  8:00 PM  Result Value Ref Range Status   MRSA by PCR NEGATIVE NEGATIVE Final    Comment:        The GeneXpert MRSA Assay (FDA approved for NASAL specimens only), is one component of a comprehensive MRSA colonization surveillance program. It is not intended to diagnose MRSA infection nor to guide  or monitor treatment for MRSA infections. Performed at [facility], [place] [address], [place], [place] [address]          Radiology Studies: Dg Chest Port 1 View  Result Date: 06/21/2018 CLINICAL DATA:  Cough, COPD, follow-up pneumonia EXAM: PORTABLE CHEST 1 VIEW COMPARISON:  06/09/2018 chest radiograph. FINDINGS: Stable cardiomediastinal silhouette with normal heart size. No pneumothorax. Small to moderate right pleural effusion, increased. Stable trace left pleural effusion. Emphysema. No pulmonary edema. Patchy opacities at the right greater than left lung bases, worsened on the right and stable on the left. IMPRESSION: 1. Small to moderate right pleural effusion, increased. Trace left pleural effusion, stable. 2. Patchy opacities at the right greater than left lung bases, worsened on the right and stable on the left, which could represent atelectasis, aspiration or pneumonia. Follow-up chest radiographs to resolution advised. 3. Emphysema. Electronically Signed   By: Ilona Sorrel M.D.   On: 06/21/2018 10:17        Scheduled Meds: . collagenase   Topical Daily  . folic acid  1 mg Per Tube Daily  .  HYDROmorphone (DILAUDID) injection  0.5 mg Intravenous Once  . levothyroxine  100 mcg Per Tube QAC breakfast  . lipase/protease/amylase  12,000 Units Oral TID WC & HS  . magnesium oxide  800 mg Oral BID  . megestrol  800 mg Per Tube Daily  . mirtazapine  30 mg Oral QHS  . multivitamin with minerals  1 tablet Per Tube Daily  . nicotine  21 mg Transdermal Daily  . pantoprazole  20 mg Oral BID  . phosphorus  500 mg Per Tube TID WC & HS  . sucralfate  1 g Per Tube TID WC & HS   Continuous Infusions: . sodium chloride Stopped (06/17/18 0601)  . feeding supplement (OSMOLITE 1.2 CAL) 25 mL/hr at 06/21/18 1428     LOS: 13 days    Time  spent: 35 minutes.     Hosie Poisson, MD [facility] Pager [phone]  If 7PM-7AM, please contact  night-coverage [email or web address] Password [facility] 06/22/2018, 12:51 PM

## 2018-06-22 NOTE — Progress Notes (Signed)
  Speech Language Pathology Treatment: Dysphagia  Patient Details Name: Russell Williamson MRN: [id number] DOB: May 09, 1956 Today's Date: 06/22/2018 Time: 5361-4431 SLP Time Calculation (min) (ACUTE ONLY): 21 min  Assessment / Plan / Recommendation Clinical Impression  Pt seen for dysphagia intervention during breakfast meal. Discussed/educated re: differences between puree (D1) and (Dys 2). Pt  Pt's dentures are not present, he typically eats with them in and reported "peanut butter cracker from last night would have been too hard". Small pieces of graham cracker placed in pudding which he masticated and transited with adequate timing and oral clearance. Pt's chronic dysphagia evident this session with pharyngeal congestion, delayed coughing and throat clear and wet vocal quality. He required moderate verbal cues/reminders to perform multiple swallows and hard coughs which are intermittently productive using Yankeur to suction. Discussed purpose of PEG which is providing majority of nutrition (and continued aspiration risks involved) and encouraged pt to stop eating/drinking during a meal if coughing is excessive, uncomfortable or significantly increased pharyngeal congestion or respiratory issues present. Educated importance of positioning and demonstrated reverse Trendelenburg positioning of bed  with RN.     HPI HPI: Teri Diltz is a 62 y.o. male with medical history significant of hypertension, COPD, GERD, depression, anemia, chronic pancreatitis, tonsillar cancer (radiation therapy in remission), tobacco abuse, cocaine abuse, GERD, depression admitted with sepsis with right lower lobe pneumonia, hypothermia and hypoglycemia. Known to ST from prior admissions, with severe, chronic pharyngoesophageal dysphagia. Per MBS 02/15/18, dys 1, honey by teaspoon was recommended.      SLP Plan  Continue with current plan of care       Recommendations  Diet recommendations: Dysphagia 1  (puree);Honey-thick liquid Liquids provided via: Cup Medication Administration: Crushed with puree Supervision: Full supervision/cueing for compensatory strategies;Patient able to self feed Compensations: Slow rate;Small sips/bites;Multiple dry swallows after each bite/sip;Hard cough after swallow Postural Changes and/or Swallow Maneuvers: Seated upright 90 degrees                Plan: Continue with current plan of care       GO                Houston Siren 06/22/2018, 10:11 AM  Orbie Pyo Colvin Caroli.Ed Risk analyst [phone] Office [phone]

## 2018-06-22 NOTE — Progress Notes (Signed)
MD Karleen Hampshire states to start tube feed at 40ml/hr. Keep close watch on patient abdomen for distention. Will pass on information to oncoming nurse.

## 2018-06-23 LAB — BASIC METABOLIC PANEL
ANION GAP: 12 (ref 5–15)
BUN: 7 mg/dL — ABNORMAL LOW (ref 8–23)
CO2: 33 mmol/L — ABNORMAL HIGH (ref 22–32)
Calcium: 7.9 mg/dL — ABNORMAL LOW (ref 8.9–10.3)
Chloride: 90 mmol/L — ABNORMAL LOW (ref 98–111)
Creatinine, Ser: 0.39 mg/dL — ABNORMAL LOW (ref 0.61–1.24)
GFR calc non Af Amer: >60 mL/min (ref >60)
Glucose, Bld: 70 mg/dL (ref 70–99)
POTASSIUM: 2.5 mmol/L — AB (ref 3.5–5.1)
Sodium: 135 mmol/L (ref 135–145)

## 2018-06-23 LAB — GLUCOSE, CAPILLARY
GLUCOSE-CAPILLARY: 71 mg/dL (ref 70–99)
GLUCOSE-CAPILLARY: 49 mg/dL — AB (ref 70–99)
GLUCOSE-CAPILLARY: 144 mg/dL — AB (ref 70–99)
GLUCOSE-CAPILLARY: 144 mg/dL — AB (ref 70–99)
Glucose-Capillary: 111 mg/dL — ABNORMAL HIGH (ref 70–99)
Glucose-Capillary: 236 mg/dL — ABNORMAL HIGH (ref 70–99)
Glucose-Capillary: 42 mg/dL — CL (ref 70–99)
Glucose-Capillary: 60 mg/dL — ABNORMAL LOW (ref 70–99)

## 2018-06-23 LAB — MAGNESIUM: Magnesium: 1.6 mg/dL — ABNORMAL LOW (ref 1.7–2.4)

## 2018-06-23 LAB — LIPASE, BLOOD: LIPASE: 18 U/L (ref 11–51)

## 2018-06-23 MED ORDER — POTASSIUM CHLORIDE 2 MEQ/ML IV SOLN
INTRAVENOUS | Status: DC
  Administered 2018-06-23 – 2018-06-24 (×3): via INTRAVENOUS
  Filled 2018-06-23 (×6): qty 1000

## 2018-06-23 MED ORDER — MAGNESIUM SULFATE 50 % IJ SOLN
3.0000 g | Freq: Once | INTRAVENOUS | Status: AC
  Administered 2018-06-23: 3 g via INTRAVENOUS
  Filled 2018-06-23: qty 6

## 2018-06-23 MED ORDER — POTASSIUM CHLORIDE 10 MEQ/100ML IV SOLN: 10.0000 meq | INTRAVENOUS | Status: DC

## 2018-06-23 MED ORDER — POTASSIUM CHLORIDE CRYS ER 20 MEQ PO TBCR
40.0000 meq | EXTENDED_RELEASE_TABLET | Freq: Two times a day (BID) | ORAL | Status: AC
  Administered 2018-06-23 (×2): 40 meq via ORAL
  Filled 2018-06-23 (×2): qty 2

## 2018-06-23 MED ORDER — POTASSIUM CHLORIDE 2 MEQ/ML IV SOLN: INTRAVENOUS | Status: DC

## 2018-06-23 MED ORDER — OSMOLITE 1.2 CAL PO LIQD
1000.0000 mL | ORAL | Status: DC
  Administered 2018-06-24 – 2018-06-25 (×2): 1000 mL
  Filled 2018-06-23 (×4): qty 1000

## 2018-06-23 NOTE — Progress Notes (Signed)
CRITICAL VALUE ALERT  Critical Value:  K 2.5   Date & Time Notied:  1227 9/20  Provider Notified: Karleen Hampshire  Orders Received/Actions taken: awaiting response

## 2018-06-23 NOTE — Progress Notes (Signed)
Physical Therapy Treatment Patient Details Name: Russell Williamson MRN: [id number] DOB: Sep 20, 1956 Today's Date: 06/23/2018    History of Present Illness Pt is a 62 y.o. male with PMH of HTN, COPD, GERD, depression, anemia, chronic pancreatitis, tonsillar cancer (radiation therapy in remission), tobacco abuse, cocaine abuse, GERD, depression, who presents 06/09/18 with AMS, cough, SOB and abdominal pain. Worked up for sepsis with right lower lobe pneumonia, likely aspiration.    PT Comments    Patient progressing slowly towards goals and requiring increased encouragement to participate. Session focused on continued progression of activity tolerance and functional mobility. Requiring up to minimal assistance for limited mobility, ambulating 7 feet in room with walker following providing total assistance for peri care after episode of bowel incontinence. Continues to display generalized weakness, deconditioning/debility, and frailty. D/c plan remains appropriate.    Follow Up Recommendations  SNF;Supervision/Assistance - 24 hour     Equipment Recommendations  Rolling walker with 5" wheels;3in1 (PT)    Recommendations for Other Services OT consult     Precautions / Restrictions Precautions Precautions: Fall Precaution Comments: Orthostatic BP Restrictions Weight Bearing Restrictions: No    Mobility  Bed Mobility Overal bed mobility: Needs Assistance Bed Mobility: Supine to Sit;Sit to Supine     Supine to sit: Min assist Sit to supine: Min assist   General bed mobility comments: Min handheld assist to elevate trunk to sit. Assist to advance BLE back onto bed at end of session.  Transfers Overall transfer level: Needs assistance Equipment used: Rolling walker (2 wheeled) Transfers: Sit to/from [facility] Sit to Stand: Min assist Stand pivot transfers: Min assist       General transfer comment: Cues for hand placement. Light assist to initiate movement  to/from BSC and EOB  Ambulation/Gait Ambulation/Gait assistance: Min guard Gait Distance (Feet): 7 Feet Assistive device: Rolling walker (2 wheeled) Gait Pattern/deviations: Step-through pattern;Narrow base of support;Trunk flexed;Decreased stride length Gait velocity: Decreased Gait velocity interpretation: <1.31 ft/sec, indicative of household ambulator General Gait Details: Patient declined hallway ambulation but willing to walk around bed. Very slow and mildly shaky gait with min guard assist for stability.    Stairs             Wheelchair Mobility    Modified Rankin (Stroke Patients Only)       Balance Overall balance assessment: Needs assistance Sitting-balance support: Single extremity supported;Feet supported Sitting balance-Leahy Scale: Fair     Standing balance support: Bilateral upper extremity supported;During functional activity Standing balance-Leahy Scale: Poor Standing balance comment: Reliance on UE support                            Cognition Arousal/Alertness: Awake/alert Behavior During Therapy: Flat affect Overall Cognitive Status: No family/caregiver present to determine baseline cognitive functioning Area of Impairment: Safety/judgement;Problem solving                         Safety/Judgement: Decreased awareness of deficits;Decreased awareness of safety   Problem Solving: Slow processing;Decreased initiation;Difficulty sequencing;Requires verbal cues;Requires tactile cues General Comments: Slow processing and requires increased time and cues for sequencing      Exercises      General Comments        Pertinent Vitals/Pain Pain Assessment: Faces Faces Pain Scale: Hurts even more Pain Location: site of peg placement on abdomen, R foot Pain Descriptors / Indicators: Grimacing Pain Intervention(s): Monitored during session    Home  Living                      Prior Function            PT Goals  (current goals can now be found in the care plan section) Acute Rehab PT Goals Patient Stated Goal: Did not state, but agreeable to OOB to chair Potential to Achieve Goals: Fair    Frequency    Min 3X/week      PT Plan Current plan remains appropriate    Co-evaluation PT/OT/SLP Co-Evaluation/Treatment: Yes Reason for Co-Treatment: Complexity of the patient's impairments (multi-system involvement);To address functional/ADL transfers PT goals addressed during session: Mobility/safety with mobility        AM-PAC PT "6 Clicks" Daily Activity  Outcome Measure  Difficulty turning over in bed (including adjusting bedclothes, sheets and blankets)?: A Little Difficulty moving from lying on back to sitting on the side of the bed? : Unable Difficulty sitting down on and standing up from a chair with arms (e.g., wheelchair, bedside commode, etc,.)?: Unable Help needed moving to and from a bed to chair (including a wheelchair)?: A Little Help needed walking in hospital room?: A Little Help needed climbing 3-5 steps with a railing? : A Lot 6 Click Score: 13    End of Session Equipment Utilized During Treatment: Gait belt Activity Tolerance: Patient limited by fatigue Patient left: in bed;with call bell/phone within reach Nurse Communication: Mobility status PT Visit Diagnosis: Unsteadiness on feet (R26.81);Other abnormalities of gait and mobility (R26.89);Muscle weakness (generalized) (M62.81);Difficulty in walking, not elsewhere classified (R26.2)     Time: 1100-1140 PT Time Calculation (min) (ACUTE ONLY): 40 min  Charges:  $Therapeutic Activity: 23-37 mins                    Ellamae Sia, [place], DPT Acute Rehabilitation Services Pager [phone] Office [phone]    Willy Eddy 06/23/2018, 2:26 PM

## 2018-06-23 NOTE — Progress Notes (Signed)
PT Cancellation Note  Patient Details Name: Russell Williamson MRN: [id number] DOB: 07/31/1956   Cancelled Treatment:    Reason Eval/Treat Not Completed: Other (comment).  Two conflicts with PT for pain meds then a meal being consumed later.  Will have another therapist ck on pt as time and pt allow.   Ramond Dial 06/23/2018, 10:23 AM   Mee Hives, PT MS Acute Rehab Dept. Number: [place] [facility] [place]

## 2018-06-23 NOTE — Progress Notes (Signed)
Occupational Therapy Treatment Patient Details Name: Russell Williamson MRN: [id number] DOB: 16-Nov-1955 Today's Date: 06/23/2018    History of present illness Pt is a 62 y.o. male with PMH of HTN, COPD, GERD, depression, anemia, chronic pancreatitis, tonsillar cancer (radiation therapy in remission), tobacco abuse, cocaine abuse, GERD, depression, who presents 06/09/18 with AMS, cough, SOB and abdominal pain. Worked up for sepsis with right lower lobe pneumonia, likely aspiration.   OT comments  Pt progressing towards acute OT goals. Initially focus of session was to progress functional mobility however pt reporting episode of bowel incontinence at start of session. Pt completed bed mobility pivoted to BSC, pericare, and in room functional mobility. At end of session pt stating that he knows he's still weak but he feels he is getting a little stronger. D/c plan remains appropriate.     Follow Up Recommendations  SNF;Supervision/Assistance - 24 hour    Equipment Recommendations  3 in 1 bedside commode;Tub/shower seat(reacher)    Recommendations for Other Services      Precautions / Restrictions Precautions Precautions: Fall Precaution Comments: Orthostatic BP Restrictions Weight Bearing Restrictions: No       Mobility Bed Mobility Overal bed mobility: Needs Assistance Bed Mobility: Supine to Sit;Sit to Supine     Supine to sit: Min assist Sit to supine: Min assist   General bed mobility comments: Min handheld assist to elevate trunk to sit. Assist to advance BLE back onto bed at end of session.  Transfers Overall transfer level: Needs assistance Equipment used: Rolling walker (2 wheeled) Transfers: Sit to/from [facility] Sit to Stand: Min assist Stand pivot transfers: Min assist       General transfer comment: Cues for hand placement. Assist to steady. to/from [facility] and EOB    Balance Overall balance assessment: Needs assistance Sitting-balance support:  Single extremity supported;Feet supported Sitting balance-Leahy Scale: Fair     Standing balance support: Bilateral upper extremity supported;During functional activity Standing balance-Leahy Scale: Poor Standing balance comment: Reliance on UE support                           ADL either performed or assessed with clinical judgement   ADL Overall ADL's : Needs assistance/impaired                         Toilet Transfer: Minimal assistance;Stand-pivot;RW Toilet Transfer Details (indicate cue type and reason): EOB >BSC Toileting- Clothing Manipulation and Hygiene: Moderate assistance;Sit to/from stand Toileting - Clothing Manipulation Details (indicate cue type and reason): Pt able to clean groin area, assist to complete posterior pericare.      Functional mobility during ADLs: Minimal assistance;Rolling walker;Cueing for safety General ADL Comments: Continues to present with decreased balance and activity tolerance.     Vision       Perception     Praxis      Cognition Arousal/Alertness: Awake/alert Behavior During Therapy: Flat affect Overall Cognitive Status: No family/caregiver present to determine baseline cognitive functioning Area of Impairment: Safety/judgement;Problem solving                         Safety/Judgement: Decreased awareness of deficits;Decreased awareness of safety   Problem Solving: Slow processing;Decreased initiation;Difficulty sequencing;Requires verbal cues;Requires tactile cues General Comments: Slow processing and requires increased time and cues        Exercises     Shoulder Instructions  General Comments Pt sitting up in bed eating food from his tray at start of session.     Pertinent Vitals/ Pain       Pain Assessment: Faces Faces Pain Scale: Hurts even more Pain Location: site of peg placement on abdomen, R foot Pain Descriptors / Indicators: Grimacing Pain Intervention(s): Monitored during  session;Limited activity within patient's tolerance;Repositioned;Premedicated before session  Home Living                                          Prior Functioning/Environment              Frequency  Min 2X/week        Progress Toward Goals  OT Goals(current goals can now be found in the care plan section)  Progress towards OT goals: Progressing toward goals  Acute Rehab OT Goals Patient Stated Goal: Did not state, but agreeable to OOB to chair OT Goal Formulation: With patient Time For Goal Achievement: 06/28/18 Potential to Achieve Goals: Good ADL Goals Pt Will Perform Grooming: with supervision;with set-up;standing Pt Will Perform Upper Body Bathing: with set-up;with supervision;sitting Pt Will Perform Lower Body Bathing: with mod assist;sitting/lateral leans;sit to/from stand;with caregiver independent in assisting Pt Will Perform Upper Body Dressing: with set-up;with supervision;sitting Pt Will Perform Lower Body Dressing: with mod assist;sitting/lateral leans;sit to/from stand;with caregiver independent in assisting Pt Will Transfer to Toilet: with min guard assist;with supervision;ambulating Pt Will Perform Toileting - Clothing Manipulation and hygiene: with min assist;sit to/from stand Pt Will Perform Tub/Shower Transfer: with min guard assist;with supervision;ambulating;shower seat;grab bars;3 in 1;rolling walker  Plan Discharge plan remains appropriate    Co-evaluation    PT/OT/SLP Co-Evaluation/Treatment: Yes Reason for Co-Treatment: Complexity of the patient's impairments (multi-system involvement);To address functional/ADL transfers   OT goals addressed during session: ADL's and self-care      AM-PAC PT "6 Clicks" Daily Activity     Outcome Measure   Help from another person eating meals?: None Help from another person taking care of personal grooming?: A Little Help from another person toileting, which includes using toliet,  bedpan, or urinal?: A Lot Help from another person bathing (including washing, rinsing, drying)?: A Lot Help from another person to put on and taking off regular upper body clothing?: A Little Help from another person to put on and taking off regular lower body clothing?: A Lot 6 Click Score: 16    End of Session Equipment Utilized During Treatment: Rolling walker;Gait belt  OT Visit Diagnosis: Unsteadiness on feet (R26.81);Other abnormalities of gait and mobility (R26.89);Muscle weakness (generalized) (M62.81)   Activity Tolerance Patient limited by pain;Patient limited by fatigue   Patient Left in bed;with call bell/phone within reach   Nurse Communication Mobility status;Other (comment)(Needs replacement sacral pad (soiled))        Time: 5790-3833 OT Time Calculation (min): 38 min  Charges: OT General Charges $OT Visit: 1 Visit OT Treatments $Self Care/Home Management : 8-22 mins  Tyrone Schimke, OT Acute Rehabilitation Services Pager: [phone] Office: [phone]    Hortencia Pilar 06/23/2018, 12:54 PM

## 2018-06-23 NOTE — Progress Notes (Signed)
Hypoglycemic Event  CBG: 42  Treatment: D50 IV 50 mL  Symptoms: Shaky  Follow-up CBG: Time:236 CBG Result:0034  Possible Reasons for Event: Inadequate meal intake  Comments/MD notified:per hypoglycemia protocol    Russell Williamson

## 2018-06-23 NOTE — Progress Notes (Signed)
Nutrition Follow-up  DOCUMENTATION CODES:   Severe malnutrition in context of chronic illness, Underweight  INTERVENTION:   Tube Feeding:  Recommend increasing Osmolite 1.2 back to goal rate of 55 ml/hr as tolerated  Once pt demonstrating tolerance of TF at goal rate; recommend transitioning to bolus feedings. Creon will need to be dosed with each bolus feeding Bolus TF regimen: Osmolite 1.5 1 can (237 mL) QID (provides 1420 kcals, 60 g of protein and 768 mL of free water. Provided additional 250 mL TID of free ater via G-tube   NUTRITION DIAGNOSIS:   Severe Malnutrition related to chronic illness(COPD) as evidenced by severe fat depletion, severe muscle depletion.  Being addressed via TF  GOAL:   Patient will meet greater than or equal to 90% of their needs  MONITOR:   PO intake, Supplement acceptance, Labs, Skin  REASON FOR ASSESSMENT:   Consult Assessment of nutrition requirement/status  ASSESSMENT:   62 yo male with PMH of HTN, COPD, GERD, anemia, chronic pancreatitis, tonsillar cancer, tobacco & cocaine abuse, who was admitted on 9/6 with AMS, SOB, hypoglycemia.  Pt continues to eat minimally  TF held over night due to concern over abdominal distention; abdominal xray obtained which was negative  Hypoglycemic episodes post holding TF; hypoglycemia continues today despite TF re-initiation. Vital 1.5 infusing at 55 ml/hr  +BM, no significant diarrhea per RN report. Pt reports stomach hurts at insertion site of G-tube but denies cramping  Labs: potassium 2.5 (L), mangesium 1.6 (wdl), CBGs 28-236 Meds: KCl, K Phos neutral, MVI with minerals, remeron, megace, mag sulfate, mag ox, creon, folic acid, S1-SE at Kcl at 100 ml/hr  Diet Order:   Diet Order            DIET - DYS 1 Room service appropriate? Yes; Fluid consistency: Honey Thick  Diet effective now              EDUCATION NEEDS:   No education needs have been identified at this time  Skin:  Skin  Assessment: Skin Integrity Issues: Skin Integrity Issues:: Unstageable DTI: R & L heel Stage II: n/a Unstageable: sacrum  Last BM:  9/19  Height:   Ht Readings from Last 1 Encounters:  06/09/18 5\' 5"  (1.651 m)    Weight:   Wt Readings from Last 1 Encounters:  06/14/18 36.9 kg    Ideal Body Weight:  61.8 kg  BMI:  Body mass index is 13.54 kg/m.  Estimated Nutritional Needs:   Kcal:  1400-1700 kcals   Protein:  60-80 gm  Fluid:  1.5 L   Kerman Passey MS, RD, LDN, CNSC [phone] Pager  [phone] Weekend/On-Call Pager

## 2018-06-23 NOTE — Progress Notes (Signed)
PROGRESS NOTE    Russell Williamson  SAY:[id number] DOB: 03/26/1956 DOA: 06/09/2018 PCP: Harvie Junior, MD    Brief Narrative:   62 y.o.malewith a hx ofHTN, COPD, GERD, depression, anemia, chronic pancreatitis, tonsillar cancer (radiation therapy in remission), tobacco abuse, cocaine abuse, and GERD who presented with altered mental status, cough, shortness of breath and abdominal pain. He was admitted for sepsis from aspiration pneumonia.    Assessment & Plan:   Principal Problem:   Hypoglycemia Active Problems:   HTN (hypertension)   Pancytopenia (HCC)   Chronic alcoholic pancreatitis (HCC)   Protein-calorie malnutrition, severe   COPD with chronic bronchitis (HCC)   Tobacco abuse   Sepsis ([place])   Acute metabolic encephalopathy   Aspiration pneumonia (HCC)   Lobar pneumonia (HCC)   Abnormal LFTs   Abdominal pain   Failure to thrive (0-17)   Advance care planning   Sepsis from aspiration pneumonia:  Appears to have resolved.   SLP eval recommending dysphagia 1 diet , with alternate means for nutrition like PEG tube. IR consulted for PEG tube placement. He underwent PEG placement on 9/16. Meanwhile he completed 8 days of IV Unasyn.  He denies sob or chest pain. He was weaned off oxygen. He has been afebrile and there is no leukocytosis. But CXR on 9/18 showed Patchy opacities at the right greater than left lung bases, worsened on the right and stable on the left, which could represent atelectasis, aspiration or pneumonia. Follow-up chest radiographs to resolution advised. Suspect he has atelectasis, will order incentive spirometry and PT evaluation recommending SNF, but pt continues to refuse SNF and wanted to go home.    Severe Hypothyroidism:  TSH >25. On IV synthroid.  Monitor thyroid panel in 4 weeks.  TSH Improved from 21 to 11.    Chronic pancreatitis with cachexia and severe protein calorie malnutrition:  abd pain improving. Nausea is persistent and pt  requesting anti emetics. Resume pancreatic enzyme supplementation.  Megace was added for appetite stimulation increased it to 800 mg /day. Since his oral nutrition is poor, PEG tube ordered and placed on 9/16. Nutrition consulted to start tube feeds. He was started on tube feeds slowly and with caution to prevent refeeding syndrome,. Yesterday  patient was  at 35 ml/hr tube feeds and started having abdominal distention, worsening abd pain and nausea. The tube feeds were stopped, and abdominal film ordered for evaluation of obstruction vs ileus. ABD film did not show any obstruction. His abdominal distention improved today and he was restarted on his tube feeds.  Resume Mirtazapine too.   Persistent Hypokalemia; replace as needed and repeat in am.    Hypomagnesemia replaced.    GERD; on PPI and carafate. No changes in medications.    Substance abuse with cocaine and tobacco abuse: Counseled and SW consulted for resources. PT recommended SNF, but pt wants to go home.    Persistent hypomagnesemia and hypophosphatemia:  Due to poor intake/ supplementation provided and repeat levels wnl.  Continue to encourage oral intake.    Hypothermia and hypoglycemia; probably secondary to sepsis and hypothyroidism.  Improved hypothermia. He continues to have hypoglycemia episodes, I had to restart him on dextrose fluids.     Elevated liver function tests: Improving, ? Hypotension? Shock liver on admission. Improving. Recommend checking liver function panel in 4 weeks.   Failure ot thrive: Despite multiple measures, pt denies eating and repeatedly is hypoglycemic. Palliative care consulted, now going forward with full aggressive care.  DVT prophylaxis: Lovenox.  Code Status:  FULL CODE.  Family Communication: none at bedside.  Disposition Plan: possible d/c home tomorrow if his electrolytes improve.   Consultants:   IR  Physical therapy.   slp  Procedures: PEG placement on  9/16   Antimicrobials:  Completed Unasyn.   Subjective: abd distention improved. One BM today. No chest pain or sob.  Working with PT.   Objective: Vitals:   06/23/18 0041 06/23/18 0043 06/23/18 0444 06/23/18 0825  BP:   129/82 (!) 129/91  Pulse:   89 81  Resp:   (!) 22 18  Temp:   98.3 F (36.8 C) 98.5 F (36.9 C)  TempSrc:   Oral Oral  SpO2: 92% 93% 97% 95%  Weight:      Height:        Intake/Output Summary (Last 24 hours) at 06/23/2018 1310 Last data filed at 06/23/2018 0600 Gross per 24 hour  Intake 330 ml  Output 1400 ml  Net -1070 ml   Filed Weights   06/09/18 2245 06/14/18 0510  Weight: 39.9 kg 36.9 kg    Examination: General exam: cachetic looking gentleman not in distress.  Respiratory system: diminished air entry  Cardiovascular system: S1 & S2 heard, RRR. No JVD, murmurs, Gastrointestinal system: Abdomen is soft, non tender, mildly distended. Bowel sounds good.  Central nervous system: Alert and oriented. Non focal.  Extremities: poor muscle tone. No pedal edema. multiple heel ulcers and sacral decubitus ulcer.  Psychiatry: Mood & affect appropriate.     Data Reviewed: I have personally reviewed following labs and imaging studies  CBC: Recent Labs  Lab 06/17/18 0637 06/18/18 0713 06/21/18 0859 06/22/18 0835  WBC 7.4 6.1 8.0 6.6  HGB 9.7* 8.4* 7.9* 8.2*  HCT 30.1* 26.7* 22.5* 24.6*  MCV 104.9* 109.0* 98.7 103.8*  PLT 164 134* 221 409   Basic Metabolic Panel: Recent Labs  Lab 06/19/18 0542  06/20/18 0642 06/20/18 1706 06/21/18 0706 06/21/18 2030 06/22/18 0657 06/22/18 0835 06/23/18 0857  NA 135  --  131*  --  134*  --   --  132* 135  K 3.2*  --  2.8*  --  4.1  --   --  3.3* 2.5*  CL 93*  --  88*  --  94*  --   --  88* 90*  CO2 31  --  34*  --  27  --   --  29 33*  GLUCOSE 188*  --  156*  --  71  --   --  116* 70  BUN 7*  --  5*  --  7*  --   --  8 7*  CREATININE 0.35*  --  0.43*  --  0.35*  --   --  0.44* 0.39*  CALCIUM 7.3*  --   7.5*  --  7.8*  --   --  7.8* 7.9*  MG  --    < > 1.3* 2.6* 1.8 1.7 1.5*  --  1.6*  PHOS  --   --  2.6 2.8 3.7 4.2 3.5  --   --    < > = values in this interval not displayed.   GFR: Estimated Creatinine Clearance: 50 mL/min (A) (by C-G formula based on SCr of 0.39 mg/dL (L)). Liver Function Tests: Recent Labs  Lab 06/17/18 0637 06/18/18 0713 06/20/18 0642 06/21/18 0706  AST 86* 188* 56* 43*  ALT 72* 119* 73* 63*  ALKPHOS 166* 185* 150* 151*  BILITOT 0.8  0.5 0.5 0.6  PROT 6.7 5.7* 5.9* 6.0*  ALBUMIN 2.5* 2.0* 1.9* 2.0*   No results for input(s): LIPASE, AMYLASE in the last 168 hours. No results for input(s): AMMONIA in the last 168 hours. Coagulation Profile: No results for input(s): INR, PROTIME in the last 168 hours. Cardiac Enzymes: No results for input(s): CKTOTAL, CKMB, CKMBINDEX, TROPONINI in the last 168 hours. BNP (last 3 results) No results for input(s): PROBNP in the last 8760 hours. HbA1C: No results for input(s): HGBA1C in the last 72 hours. CBG: Recent Labs  Lab 06/23/18 0034 06/23/18 0441 06/23/18 0821 06/23/18 1151 06/23/18 1245  GLUCAP 236* 111* 71 49* 60*   Lipid Profile: No results for input(s): CHOL, HDL, LDLCALC, TRIG, CHOLHDL, LDLDIRECT in the last 72 hours. Thyroid Function Tests: No results for input(s): TSH, T4TOTAL, FREET4, T3FREE, THYROIDAB in the last 72 hours. Anemia Panel: No results for input(s): VITAMINB12, FOLATE, FERRITIN, TIBC, IRON, RETICCTPCT in the last 72 hours. Sepsis Labs: No results for input(s): PROCALCITON, LATICACIDVEN in the last 168 hours.  Recent Results (from the past 240 hour(s))  MRSA PCR Screening     Status: None   Collection Time: 06/13/18  8:00 PM  Result Value Ref Range Status   MRSA by PCR NEGATIVE NEGATIVE Final    Comment:        The GeneXpert MRSA Assay (FDA approved for NASAL specimens only), is one component of a comprehensive MRSA colonization surveillance program. It is not intended to diagnose  MRSA infection nor to guide or monitor treatment for MRSA infections. Performed at [facility], [place] [address], [place], [place] [id number]          Radiology Studies: Dg Abd 2 Views  Result Date: 06/22/2018 CLINICAL DATA:  Abdominal distension EXAM: ABDOMEN - 2 VIEW COMPARISON:  CT abdomen and pelvis May 30, 2018. FINDINGS: Supine and upright images obtained. Gastrostomy catheter is positioned in the left upper abdomen. There is no appreciable bowel dilatation or air-fluid level to suggest bowel obstruction. No free air. There are surgical clips in the gallbladder fossa region. There is a right pleural effusion with right base consolidation. There are foci of pancreatic calcification evident. IMPRESSION: Gastrostomy catheter on the left. Evidence suggesting a degree of chronic pancreatitis. No bowel obstruction or free air evident. Right pleural effusion with right base consolidation noted. Electronically Signed   By: Lowella Grip III M.D.   On: 06/22/2018 15:29        Scheduled Meds: . collagenase   Topical Daily  . folic acid  1 mg Per Tube Daily  . levothyroxine  100 mcg Per Tube QAC breakfast  . lipase/protease/amylase  12,000 Units Oral TID WC & HS  . magnesium oxide  800 mg Oral BID  . megestrol  800 mg Per Tube Daily  . mirtazapine  30 mg Oral QHS  . multivitamin with minerals  1 tablet Per Tube Daily  . nicotine  21 mg Transdermal Daily  . pantoprazole  20 mg Oral BID  . phosphorus  500 mg Per Tube TID WC & HS  . potassium chloride  40 mEq Oral BID  . sucralfate  1 g Per Tube TID WC & HS   Continuous Infusions: . sodium chloride Stopped (06/17/18 0601)  . dextrose 5 %-0.9% NaCl with KCl/Additives Pediatric custom IV fluid    . feeding supplement (OSMOLITE 1.2 CAL) 35 mL/hr at 06/23/18 1236  . magnesium sulfate 1 - 4 g bolus IVPB  LOS: 14 days    Time spent: 35 minutes.     Hosie Poisson, MD [facility] Pager [phone]  If  7PM-7AM, please contact night-coverage [email or web address] Password [email or web address] 06/23/2018, 1:10 PM

## 2018-06-24 LAB — GLUCOSE, CAPILLARY
GLUCOSE-CAPILLARY: 110 mg/dL — AB (ref 70–99)
GLUCOSE-CAPILLARY: 132 mg/dL — AB (ref 70–99)
GLUCOSE-CAPILLARY: 120 mg/dL — AB (ref 70–99)
Glucose-Capillary: 144 mg/dL — ABNORMAL HIGH (ref 70–99)
Glucose-Capillary: 140 mg/dL — ABNORMAL HIGH (ref 70–99)
Glucose-Capillary: 148 mg/dL — ABNORMAL HIGH (ref 70–99)

## 2018-06-24 LAB — CBC
HCT: 20.8 % — ABNORMAL LOW (ref 39.0–52.0)
HEMATOCRIT: 21.4 % — AB (ref 39.0–52.0)
HEMOGLOBIN: 6.8 g/dL — AB (ref 13.0–17.0)
Hemoglobin: 7 g/dL — ABNORMAL LOW (ref 13.0–17.0)
MCH: 34.7 pg — ABNORMAL HIGH (ref 26.0–34.0)
MCH: 34.7 pg — AB (ref 26.0–34.0)
MCHC: 32.7 g/dL (ref 30.0–36.0)
MCHC: 32.7 g/dL (ref 30.0–36.0)
MCV: 106.1 fL — ABNORMAL HIGH (ref 78.0–100.0)
MCV: 105.9 fL — AB (ref 78.0–100.0)
PLATELETS: 204 10*3/uL (ref 150–400)
Platelets: 210 10*3/uL (ref 150–400)
RBC: 1.96 MIL/uL — ABNORMAL LOW (ref 4.22–5.81)
RBC: 2.02 MIL/uL — ABNORMAL LOW (ref 4.22–5.81)
RDW: 15 % (ref 11.5–15.5)
RDW: 14.9 % (ref 11.5–15.5)
WBC: 5.3 10*3/uL (ref 4.0–10.5)
WBC: 5.4 10*3/uL (ref 4.0–10.5)

## 2018-06-24 LAB — BASIC METABOLIC PANEL
Anion gap: 6 (ref 5–15)
BUN: 5 mg/dL — AB (ref 8–23)
CALCIUM: 7.5 mg/dL — AB (ref 8.9–10.3)
CO2: 34 mmol/L — ABNORMAL HIGH (ref 22–32)
Chloride: 98 mmol/L (ref 98–111)
Creatinine, Ser: 0.36 mg/dL — ABNORMAL LOW (ref 0.61–1.24)
GFR calc Af Amer: >60 mL/min (ref >60)
Glucose, Bld: 148 mg/dL — ABNORMAL HIGH (ref 70–99)
POTASSIUM: 4.2 mmol/L (ref 3.5–5.1)
SODIUM: 138 mmol/L (ref 135–145)

## 2018-06-24 LAB — IRON AND TIBC
Iron: 73 ug/dL (ref 45–182)
Saturation Ratios: 65 % — ABNORMAL HIGH (ref 17.9–39.5)
TIBC: 112 ug/dL — ABNORMAL LOW (ref 250–450)
UIBC: 39 ug/dL

## 2018-06-24 LAB — FERRITIN: Ferritin: 537 ng/mL — ABNORMAL HIGH (ref 24–336)

## 2018-06-24 LAB — MAGNESIUM: MAGNESIUM: 1.8 mg/dL (ref 1.7–2.4)

## 2018-06-24 LAB — PREPARE RBC (CROSSMATCH)

## 2018-06-24 MED ORDER — PANTOPRAZOLE SODIUM 40 MG PO PACK
20.0000 mg | PACK | Freq: Two times a day (BID) | ORAL | Status: DC
  Administered 2018-06-25 – 2018-06-26 (×3): 20 mg
  Filled 2018-06-24 (×5): qty 20

## 2018-06-24 MED ORDER — MIRTAZAPINE 15 MG PO TABS
30.0000 mg | ORAL_TABLET | Freq: Every day | ORAL | Status: DC
  Administered 2018-06-24 – 2018-06-25 (×2): 30 mg
  Filled 2018-06-24: qty 2

## 2018-06-24 MED ORDER — MAGNESIUM OXIDE 400 (241.3 MG) MG PO TABS
800.0000 mg | ORAL_TABLET | Freq: Two times a day (BID) | ORAL | Status: DC
  Administered 2018-06-24 – 2018-06-26 (×4): 800 mg
  Filled 2018-06-24 (×3): qty 2

## 2018-06-24 MED ORDER — SODIUM CHLORIDE 0.9% IV SOLUTION
Freq: Once | INTRAVENOUS | Status: AC
  Administered 2018-06-24: 17:00:00 via INTRAVENOUS

## 2018-06-24 MED ORDER — GUAIFENESIN-DM 100-10 MG/5ML PO SYRP: 15.0000 mL | ORAL_SOLUTION | Freq: Four times a day (QID) | ORAL | Status: DC | PRN

## 2018-06-24 NOTE — Progress Notes (Signed)
06/23/18 1945 Patient refused for lab tech  to try to Burke him for K+ level. Julisa Flippo, Wonda Cheng, Therapist, sports

## 2018-06-24 NOTE — Progress Notes (Signed)
PROGRESS NOTE  Russell Williamson CXK:[id number] DOB: 10-Aug-1956 DOA: 06/09/2018 PCP: Harvie Junior, MD  HPI/Brief Narrative  Russell Williamson is a 62 y.o. year old male with medical history significant for  HTN, COPD, depression, chronic pancreatitis, prostate cancer status post radiation therapy and currently in remission  who presented on 06/09/2018 with altered mental status, cough, shortness of breath and abdominal pain was found to have sepsis from aspiration pneumonia. Subjective No acute complaints  Assessment/Plan:  Acute on chronic anemia.  No bleeding episodes here.  Previous baseline 7-8, CBC this a.m. hemoglobin 6.8 with repeat of 7.  Give 1 unit blood transfusion on 9/21 we will repeat CBC after.  Hypoglycemia.  Unclear etiology.   Currently on D5 infusion will attempt to wean off especially since patient now seen nutrition via PEG, glucoses remain 140s no reported hypoglycemic episodes since 9/20.  D50 as needed  Sepsis from aspiration pneumonia.  Sepsis physiology has resolved.  Completed a day course of Unasyn and weaned off supplemental oxygen.  Repeat chest x-rays have showed atelectasis, encourage incentive spirometry  Dysphagia, status post PEG placement on 9/16 (speech evaluation recommended dysphagia 1 diet) disease, patient currently tolerating tube feeds  Severe hypothyroidism, improving TSH initially 21 now 11.  Continue levothyroxine.  Will need repeat TSH as outpatient  Chronic pancreatitis with cachexia and severe protein calorie malnutrition.  Abdominal pain continues to improve team patient on hepatic enzyme supplementation, Megace for appetite stimulation, PEG tube as mentioned above  Hypokalemia and hypomagnesemia, resolved.  Improved with repletion  GERD, stable on PPI and Carafate  Polysubstance abuse (cocaine tobacco) patient counseled.  Social worker provided resources  Code Status: FULL    Family Communication: no family at bedside    Disposition Plan: Ensure normal glucose while off D5 infusion, stabilization of electrolytes, improvement in hemoglobin    Antimicrobials: Anti-infectives (From admission, onward)   Start     Dose/Rate Route Frequency Ordered Stop   06/11/18 1300  Ampicillin-Sulbactam (UNASYN) 3 g in sodium chloride 0.9 % 100 mL IVPB  Status:  Discontinued     3 g 200 mL/hr over 30 Minutes Intravenous Every 8 hours 06/11/18 1217 06/19/18 1059   06/10/18 2000  cefTRIAXone (ROCEPHIN) 1 g in sodium chloride 0.9 % 100 mL IVPB  Status:  Discontinued     1 g 200 mL/hr over 30 Minutes Intravenous Every 24 hours 06/09/18 2134 06/11/18 1129   06/10/18 2000  azithromycin (ZITHROMAX) 500 mg in sodium chloride 0.9 % 250 mL IVPB  Status:  Discontinued     500 mg 250 mL/hr over 60 Minutes Intravenous Every 24 hours 06/09/18 2134 06/11/18 1129   06/09/18 2200  metroNIDAZOLE (FLAGYL) tablet 250 mg  Status:  Discontinued     250 mg Oral Every 8 hours 06/09/18 2135 06/11/18 1129   06/09/18 1730  cefTRIAXone (ROCEPHIN) 1 g in sodium chloride 0.9 % 100 mL IVPB     1 g 200 mL/hr over 30 Minutes Intravenous  Once 06/09/18 1720 06/09/18 2334   06/09/18 1730  azithromycin (ZITHROMAX) 500 mg in sodium chloride 0.9 % 250 mL IVPB     500 mg 250 mL/hr over 60 Minutes Intravenous  Once 06/09/18 1720 06/09/18 2311         Cultures:  9/6, blood cultures x 2 neg  Telemetry:none  DVT prophylaxis: SCD   Objective: Vitals:   06/24/18 0741 06/24/18 1557 06/24/18 1642 06/24/18 2026  BP: 120/81 119/81 119/77 138/76  Pulse: 98 (!) 108 (!)  101 93  Resp: 18 16  16   Temp: (!) 97.4 F (36.3 C) 98.4 F (36.9 C) 97.8 F (36.6 C) 98.2 F (36.8 C)  TempSrc: Oral Oral Oral Oral  SpO2: 100% (!) 64% 95% 98%  Weight:      Height:        Intake/Output Summary (Last 24 hours) at 06/24/2018 2305 Last data filed at 06/24/2018 2238 Gross per 24 hour  Intake 2649.8 ml  Output 0 ml  Net 2649.8 ml   Filed Weights   06/09/18  2245 06/14/18 0510 06/24/18 0349  Weight: 39.9 kg 36.9 kg 36.9 kg    Exam:  Constitutional:cachetic, frail male Eyes: EOMI, anicteric,  ENMT: Oropharynx with dry mucous membranes, poor dentition Cardiovascular:  no peripheral edema Respiratory: Normal respiratory effort, clear breath sounds  Abdomen: Soft,non-tender,  Skin: No rash ulcers, or lesions. Without skin tenting  Neurologic: Grossly no focal neuro deficit. Psychiatric:Appropriate affect, and mood. Mental status AAOx3  Data Reviewed: CBC: Recent Labs  Lab 06/18/18 0713 06/21/18 0859 06/22/18 0835 06/24/18 0725 06/24/18 0911  WBC 6.1 8.0 6.6 5.3 5.4  HGB 8.4* 7.9* 8.2* 6.8* 7.0*  HCT 26.7* 22.5* 24.6* 20.8* 21.4*  MCV 109.0* 98.7 103.8* 106.1* 105.9*  PLT 134* 221 229 204 683   Basic Metabolic Panel: Recent Labs  Lab 06/20/18 0642 06/20/18 1706 06/21/18 0706 06/21/18 2030 06/22/18 0657 06/22/18 0835 06/23/18 0857 06/24/18 0725  NA 131*  --  134*  --   --  132* 135 138  K 2.8*  --  4.1  --   --  3.3* 2.5* 4.2  CL 88*  --  94*  --   --  88* 90* 98  CO2 34*  --  27  --   --  29 33* 34*  GLUCOSE 156*  --  71  --   --  116* 70 148*  BUN 5*  --  7*  --   --  8 7* 5*  CREATININE 0.43*  --  0.35*  --   --  0.44* 0.39* 0.36*  CALCIUM 7.5*  --  7.8*  --   --  7.8* 7.9* 7.5*  MG 1.3* 2.6* 1.8 1.7 1.5*  --  1.6* 1.8  PHOS 2.6 2.8 3.7 4.2 3.5  --   --   --    GFR: Estimated Creatinine Clearance: 50 mL/min (A) (by C-G formula based on SCr of 0.36 mg/dL (L)). Liver Function Tests: Recent Labs  Lab 06/18/18 0713 06/20/18 0642 06/21/18 0706  AST 188* 56* 43*  ALT 119* 73* 63*  ALKPHOS 185* 150* 151*  BILITOT 0.5 0.5 0.6  PROT 5.7* 5.9* 6.0*  ALBUMIN 2.0* 1.9* 2.0*   Recent Labs  Lab 06/23/18 0857  LIPASE 18   No results for input(s): AMMONIA in the last 168 hours. Coagulation Profile: No results for input(s): INR, PROTIME in the last 168 hours. Cardiac Enzymes: No results for input(s): CKTOTAL,  CKMB, CKMBINDEX, TROPONINI in the last 168 hours. BNP (last 3 results) No results for input(s): PROBNP in the last 8760 hours. HbA1C: No results for input(s): HGBA1C in the last 72 hours. CBG: Recent Labs  Lab 06/24/18 0439 06/24/18 0740 06/24/18 1240 06/24/18 1643 06/24/18 2025  GLUCAP 140* 148* 110* 132* 120*   Lipid Profile: No results for input(s): CHOL, HDL, LDLCALC, TRIG, CHOLHDL, LDLDIRECT in the last 72 hours. Thyroid Function Tests: No results for input(s): TSH, T4TOTAL, FREET4, T3FREE, THYROIDAB in the last 72 hours. Anemia Panel: Recent Labs  06/24/18 1322  FERRITIN 537*  TIBC 112*  IRON 73   Urine analysis:    Component Value Date/Time   COLORURINE YELLOW 06/09/2018 1747   APPEARANCEUR CLEAR 06/09/2018 1747   LABSPEC 1.015 06/09/2018 1747   PHURINE 5.0 06/09/2018 1747   GLUCOSEU >=500 (A) 06/09/2018 1747   HGBUR NEGATIVE 06/09/2018 1747   BILIRUBINUR NEGATIVE 06/09/2018 1747   KETONESUR NEGATIVE 06/09/2018 1747   PROTEINUR NEGATIVE 06/09/2018 1747   UROBILINOGEN 0.2 08/31/2014 2350   NITRITE NEGATIVE 06/09/2018 1747   LEUKOCYTESUR NEGATIVE 06/09/2018 1747   Sepsis Labs: @LABRCNTIP (procalcitonin:4,lacticidven:4)  )No results found for this or any previous visit (from the past 240 hour(s)).    Studies: No results found.  Scheduled Meds: . collagenase   Topical Daily  . folic acid  1 mg Per Tube Daily  . levothyroxine  100 mcg Per Tube QAC breakfast  . lipase/protease/amylase  12,000 Units Oral TID WC & HS  . magnesium oxide  800 mg Per Tube BID  . megestrol  800 mg Per Tube Daily  . mirtazapine  30 mg Per Tube QHS  . multivitamin with minerals  1 tablet Per Tube Daily  . nicotine  21 mg Transdermal Daily  . pantoprazole sodium  20 mg Per Tube BID  . phosphorus  500 mg Per Tube TID WC & HS  . sucralfate  1 g Per Tube TID WC & HS    Continuous Infusions: . sodium chloride Stopped (06/17/18 0601)  . dextrose 5 %-0.9% nacl with kcl 100  mL/hr at 06/24/18 1439  . feeding supplement (OSMOLITE 1.2 CAL) 1,000 mL (06/24/18 1303)     LOS: 15 days     Desiree Hane, MD [facility] Pager [phone]  If 7PM-7AM, please contact night-coverage [email or web address] Password [facility] 06/24/2018, 11:05 PM

## 2018-06-24 NOTE — Progress Notes (Signed)
Pt wants all meds to be given per tube. Messaged MD to change other orders.   Eleanora Neighbor, RN

## 2018-06-25 LAB — CBC
HEMATOCRIT: 28.2 % — AB (ref 39.0–52.0)
Hemoglobin: 9.5 g/dL — ABNORMAL LOW (ref 13.0–17.0)
MCH: 34.2 pg — ABNORMAL HIGH (ref 26.0–34.0)
MCHC: 33.7 g/dL (ref 30.0–36.0)
MCV: 101.4 fL — ABNORMAL HIGH (ref 78.0–100.0)
Platelets: 209 10*3/uL (ref 150–400)
RBC: 2.78 MIL/uL — ABNORMAL LOW (ref 4.22–5.81)
RDW: 18.3 % — AB (ref 11.5–15.5)
WBC: 6.4 10*3/uL (ref 4.0–10.5)

## 2018-06-25 LAB — BPAM RBC
BLOOD PRODUCT EXPIRATION DATE: 201910022359
ISSUE DATE / TIME: 201909211619
UNIT TYPE AND RH: 6200

## 2018-06-25 LAB — GLUCOSE, CAPILLARY
GLUCOSE-CAPILLARY: 114 mg/dL — AB (ref 70–99)
GLUCOSE-CAPILLARY: 123 mg/dL — AB (ref 70–99)
GLUCOSE-CAPILLARY: 77 mg/dL (ref 70–99)
Glucose-Capillary: 68 mg/dL — ABNORMAL LOW (ref 70–99)
Glucose-Capillary: 91 mg/dL (ref 70–99)
Glucose-Capillary: 72 mg/dL (ref 70–99)
Glucose-Capillary: 107 mg/dL — ABNORMAL HIGH (ref 70–99)

## 2018-06-25 LAB — TYPE AND SCREEN
ABO/RH(D): A POS
Antibody Screen: NEGATIVE
Unit division: 0

## 2018-06-25 LAB — ABO/RH: ABO/RH(D): A POS

## 2018-06-25 MED ORDER — DEXTROSE 50 % IV SOLN
INTRAVENOUS | Status: AC
  Filled 2018-06-25: qty 50

## 2018-06-25 NOTE — Progress Notes (Signed)
PROGRESS NOTE  Russell Williamson WPY:[id number] DOB: 1956/03/10 DOA: 06/09/2018 PCP: Harvie Junior, MD  HPI/Brief Narrative  Russell Williamson is a 62 y.o. year old male with medical history significant for  HTN, COPD, depression, chronic pancreatitis, prostate cancer status post radiation therapy and currently in remission  who presented on 06/09/2018 with altered mental status, cough, shortness of breath and abdominal pain was found to have sepsis from aspiration pneumonia. Hospital course complicated by persistent hypoglycemia requring D5 infusion, dysphagia/cachexia requiring PEG tube for supplemental intake, and severe hypothyrodism. S/p 1 U blood transfusion on 9/21 for hgb o f 6.9.  Subjective Pain all over, chronic. Coughing but better. Feels breathing somewhat stable  Assessment/Plan:  Acute on chronic anemia, improving.  No bleeding episodes here.  Previous baseline 7-8, After 1 U on 9/21 hgb improved from 6.9 to 9.5.    Hypoglycemia, .  Unclear etiology. Had another episode with level to 60, found to have leakage of tube feed that resolved with valve placement by nursing. Will monitor CBG q4 now that off D5 infusion.     Sepsis from aspiration pneumonia.  Sepsis physiology has resolved.  Completed 7 day course of Unasyn and weaned off supplemental oxygen.  Repeat chest x-rays have showed atelectasis, encourage incentive spirometry  Dysphagia, status post PEG placement on 9/16 (speech evaluation recommended dysphagia 1 diet) disease, patient currently tolerating tube feeds  Severe hypothyroidism, improving TSH initially 21 now 11.  Continue levothyroxine.  Will need repeat TSH as outpatient  COPD with chronic hypoxic respiratory failure. On supplemental O 2 at home. 3.5 L here. Decreased breath sounds with crackles on exam, encourage flutter valve and incentive spirometry.   Chronic pancreatitis with cachexia and severe protein calorie malnutrition.  Abdominal pain continues to  improve team patient on hepatic enzyme supplementation, Megace for appetite stimulation, PEG tube as mentioned above. Discuss with pharmacy if able to take lipase via feeding tube  Hypokalemia and hypomagnesemia, resolved.  Improved with repletion  GERD, stable on PPI and Carafate  Polysubstance abuse (cocaine tobacco) patient counseled.  Social worker provided resources  Code Status: FULL    Family Communication: no family at bedside   Disposition Plan: Ensure normal glucose while off D5 infusion, possible d/c tomorrow am   Antimicrobials: Anti-infectives (From admission, onward)   Start     Dose/Rate Route Frequency Ordered Stop   06/11/18 1300  Ampicillin-Sulbactam (UNASYN) 3 g in sodium chloride 0.9 % 100 mL IVPB  Status:  Discontinued     3 g 200 mL/hr over 30 Minutes Intravenous Every 8 hours 06/11/18 1217 06/19/18 1059   06/10/18 2000  cefTRIAXone (ROCEPHIN) 1 g in sodium chloride 0.9 % 100 mL IVPB  Status:  Discontinued     1 g 200 mL/hr over 30 Minutes Intravenous Every 24 hours 06/09/18 2134 06/11/18 1129   06/10/18 2000  azithromycin (ZITHROMAX) 500 mg in sodium chloride 0.9 % 250 mL IVPB  Status:  Discontinued     500 mg 250 mL/hr over 60 Minutes Intravenous Every 24 hours 06/09/18 2134 06/11/18 1129   06/09/18 2200  metroNIDAZOLE (FLAGYL) tablet 250 mg  Status:  Discontinued     250 mg Oral Every 8 hours 06/09/18 2135 06/11/18 1129   06/09/18 1730  cefTRIAXone (ROCEPHIN) 1 g in sodium chloride 0.9 % 100 mL IVPB     1 g 200 mL/hr over 30 Minutes Intravenous  Once 06/09/18 1720 06/09/18 2334   06/09/18 1730  azithromycin (ZITHROMAX) 500 mg in sodium  chloride 0.9 % 250 mL IVPB     500 mg 250 mL/hr over 60 Minutes Intravenous  Once 06/09/18 1720 06/09/18 2311        Cultures:  9/6, blood cultures x 2 neg  Telemetry:none  DVT prophylaxis: SCD   Objective: Vitals:   06/24/18 2026 06/25/18 0500 06/25/18 0504 06/25/18 0802  BP: 138/76  123/85 (!) 128/92    Pulse: 93  (!) 113 91  Resp: 16  16 16   Temp: 98.2 F (36.8 C)  98.1 F (36.7 C) 98.3 F (36.8 C)  TempSrc: Oral  Oral Oral  SpO2: 98%  96% (!) 62%  Weight:  36.9 kg    Height:        Intake/Output Summary (Last 24 hours) at 06/25/2018 1242 Last data filed at 06/25/2018 1027 Gross per 24 hour  Intake 1908.43 ml  Output 0 ml  Net 1908.43 ml   Filed Weights   06/14/18 0510 06/24/18 0349 06/25/18 0500  Weight: 36.9 kg 36.9 kg 36.9 kg    Exam:  Constitutional:cachetic, frail male Eyes: EOMI, anicteric,  ENMT: Oropharynx with dry mucous membranes, poor dentition Cardiovascular:  no peripheral edema Respiratory: Normal respiratory effort on 3.5 L [place], decreased breath sounds throughout, crackles at bases  Abdomen: Soft,non-tender, PEG tube in place ( site looks, clean, dry, intact) Skin: No rash ulcers, or lesions. Dependent edema in right upper arm Neurologic: Grossly no focal neuro deficit. Psychiatric:Appropriate affect, and mood. Mental status AAOx3  Data Reviewed: CBC: Recent Labs  Lab 06/21/18 0859 06/22/18 0835 06/24/18 0725 06/24/18 0911 06/25/18 0623  WBC 8.0 6.6 5.3 5.4 6.4  HGB 7.9* 8.2* 6.8* 7.0* 9.5*  HCT 22.5* 24.6* 20.8* 21.4* 28.2*  MCV 98.7 103.8* 106.1* 105.9* 101.4*  PLT 221 229 204 210 595   Basic Metabolic Panel: Recent Labs  Lab 06/20/18 0642 06/20/18 1706 06/21/18 0706 06/21/18 2030 06/22/18 0657 06/22/18 0835 06/23/18 0857 06/24/18 0725  NA 131*  --  134*  --   --  132* 135 138  K 2.8*  --  4.1  --   --  3.3* 2.5* 4.2  CL 88*  --  94*  --   --  88* 90* 98  CO2 34*  --  27  --   --  29 33* 34*  GLUCOSE 156*  --  71  --   --  116* 70 148*  BUN 5*  --  7*  --   --  8 7* 5*  CREATININE 0.43*  --  0.35*  --   --  0.44* 0.39* 0.36*  CALCIUM 7.5*  --  7.8*  --   --  7.8* 7.9* 7.5*  MG 1.3* 2.6* 1.8 1.7 1.5*  --  1.6* 1.8  PHOS 2.6 2.8 3.7 4.2 3.5  --   --   --    GFR: Estimated Creatinine Clearance: 50 mL/min (A) (by C-G formula  based on SCr of 0.36 mg/dL (L)). Liver Function Tests: Recent Labs  Lab 06/20/18 0642 06/21/18 0706  AST 56* 43*  ALT 73* 63*  ALKPHOS 150* 151*  BILITOT 0.5 0.6  PROT 5.9* 6.0*  ALBUMIN 1.9* 2.0*   Recent Labs  Lab 06/23/18 0857  LIPASE 18   No results for input(s): AMMONIA in the last 168 hours. Coagulation Profile: No results for input(s): INR, PROTIME in the last 168 hours. Cardiac Enzymes: No results for input(s): CKTOTAL, CKMB, CKMBINDEX, TROPONINI in the last 168 hours. BNP (last 3 results) No results for  input(s): PROBNP in the last 8760 hours. HbA1C: No results for input(s): HGBA1C in the last 72 hours. CBG: Recent Labs  Lab 06/25/18 0038 06/25/18 0503 06/25/18 0800 06/25/18 1007 06/25/18 1207  GLUCAP 114* 123* 68* 91 77   Lipid Profile: No results for input(s): CHOL, HDL, LDLCALC, TRIG, CHOLHDL, LDLDIRECT in the last 72 hours. Thyroid Function Tests: No results for input(s): TSH, T4TOTAL, FREET4, T3FREE, THYROIDAB in the last 72 hours. Anemia Panel: Recent Labs    06/24/18 1322  FERRITIN 537*  TIBC 112*  IRON 73   Urine analysis:    Component Value Date/Time   COLORURINE YELLOW 06/09/2018 1747   APPEARANCEUR CLEAR 06/09/2018 1747   LABSPEC 1.015 06/09/2018 1747   PHURINE 5.0 06/09/2018 1747   GLUCOSEU >=500 (A) 06/09/2018 1747   HGBUR NEGATIVE 06/09/2018 1747   BILIRUBINUR NEGATIVE 06/09/2018 1747   KETONESUR NEGATIVE 06/09/2018 1747   PROTEINUR NEGATIVE 06/09/2018 1747   UROBILINOGEN 0.2 08/31/2014 2350   NITRITE NEGATIVE 06/09/2018 1747   LEUKOCYTESUR NEGATIVE 06/09/2018 1747   Sepsis Labs: @LABRCNTIP (procalcitonin:4,lacticidven:4)  )No results found for this or any previous visit (from the past 240 hour(s)).    Studies: No results found.  Scheduled Meds: . collagenase   Topical Daily  . dextrose      . folic acid  1 mg Per Tube Daily  . levothyroxine  100 mcg Per Tube QAC breakfast  . lipase/protease/amylase  12,000 Units  Oral TID WC & HS  . magnesium oxide  800 mg Per Tube BID  . megestrol  800 mg Per Tube Daily  . mirtazapine  30 mg Per Tube QHS  . multivitamin with minerals  1 tablet Per Tube Daily  . nicotine  21 mg Transdermal Daily  . pantoprazole sodium  20 mg Per Tube BID  . phosphorus  500 mg Per Tube TID WC & HS  . sucralfate  1 g Per Tube TID WC & HS    Continuous Infusions: . sodium chloride Stopped (06/17/18 0601)  . feeding supplement (OSMOLITE 1.2 CAL) 1,000 mL (06/25/18 1040)     LOS: 16 days     Desiree Hane, MD [facility] Pager [phone]  If 7PM-7AM, please contact night-coverage [email or web address] Password [facility] 06/25/2018, 12:42 PM

## 2018-06-25 NOTE — Progress Notes (Addendum)
Unable to get updated HGB level. Lab has tried several times to get blood without any success. Messaged MD to make aware.   Eleanora Neighbor, RN

## 2018-06-26 ENCOUNTER — Inpatient Hospital Stay (HOSPITAL_COMMUNITY): Payer: Medicaid Other

## 2018-06-26 LAB — BASIC METABOLIC PANEL
ANION GAP: 11 (ref 5–15)
BUN: 7 mg/dL — ABNORMAL LOW (ref 8–23)
CALCIUM: 7.5 mg/dL — AB (ref 8.9–10.3)
CO2: 29 mmol/L (ref 22–32)
Chloride: 95 mmol/L — ABNORMAL LOW (ref 98–111)
Creatinine, Ser: 0.35 mg/dL — ABNORMAL LOW (ref 0.61–1.24)
Glucose, Bld: 118 mg/dL — ABNORMAL HIGH (ref 70–99)
Potassium: 4.8 mmol/L (ref 3.5–5.1)
SODIUM: 135 mmol/L (ref 135–145)

## 2018-06-26 LAB — GLUCOSE, CAPILLARY
GLUCOSE-CAPILLARY: 133 mg/dL — AB (ref 70–99)
GLUCOSE-CAPILLARY: 149 mg/dL — AB (ref 70–99)
GLUCOSE-CAPILLARY: 89 mg/dL (ref 70–99)
GLUCOSE-CAPILLARY: 91 mg/dL (ref 70–99)
Glucose-Capillary: 127 mg/dL — ABNORMAL HIGH (ref 70–99)

## 2018-06-26 MED ORDER — K PHOS MONO-SOD PHOS DI & MONO 155-852-130 MG PO TABS: 500.0000 mg | ORAL_TABLET | Freq: Three times a day (TID) | ORAL | 0 refills | Status: DC

## 2018-06-26 MED ORDER — GUAIFENESIN-DM 100-10 MG/5ML PO SYRP: 15.0000 mL | ORAL_SOLUTION | Freq: Four times a day (QID) | ORAL | 0 refills | Status: DC | PRN

## 2018-06-26 MED ORDER — PANCRELIPASE (LIP-PROT-AMYL) 12000-38000 UNITS PO CPEP: 12000.0000 [IU] | ORAL_CAPSULE | Freq: Four times a day (QID) | ORAL | 1 refills | Status: DC

## 2018-06-26 MED ORDER — FREE WATER: 250.0000 mL | Freq: Three times a day (TID) | 0 refills | Status: AC

## 2018-06-26 MED ORDER — TRAMADOL HCL 50 MG PO TABS: 50.0000 mg | ORAL_TABLET | Freq: Four times a day (QID) | ORAL | 0 refills | Status: DC | PRN

## 2018-06-26 MED ORDER — COLLAGENASE 250 UNIT/GM EX OINT: TOPICAL_OINTMENT | Freq: Every day | CUTANEOUS | 0 refills | Status: AC

## 2018-06-26 MED ORDER — PANTOPRAZOLE SODIUM 40 MG PO PACK: 20.0000 mg | PACK | Freq: Two times a day (BID) | ORAL | 0 refills | Status: AC

## 2018-06-26 MED ORDER — OSMOLITE 1.5 CAL PO LIQD
237.0000 mL | Freq: Three times a day (TID) | ORAL | Status: DC
  Filled 2018-06-26 (×4): qty 237

## 2018-06-26 MED ORDER — FREE WATER: 250.0000 mL | Freq: Three times a day (TID) | Status: DC

## 2018-06-26 MED ORDER — MAGNESIUM OXIDE 400 (241.3 MG) MG PO TABS: 800.0000 mg | ORAL_TABLET | Freq: Two times a day (BID) | ORAL | 0 refills | Status: DC

## 2018-06-26 MED ORDER — PRO-STAT SUGAR FREE PO LIQD: 30.0000 mL | Freq: Every day | ORAL | 0 refills | Status: AC

## 2018-06-26 MED ORDER — PRO-STAT SUGAR FREE PO LIQD
30.0000 mL | Freq: Every day | ORAL | Status: DC
  Administered 2018-06-26: 30 mL
  Filled 2018-06-26: qty 30

## 2018-06-26 MED ORDER — OSMOLITE 1.2 CAL PO LIQD
1000.0000 mL | ORAL | Status: DC
  Administered 2018-06-26: 1000 mL
  Filled 2018-06-26: qty 1000

## 2018-06-26 MED ORDER — LEVOTHYROXINE SODIUM 100 MCG PO TABS: 100.0000 ug | ORAL_TABLET | Freq: Every day | ORAL | 0 refills | Status: AC

## 2018-06-26 MED ORDER — MEGESTROL ACETATE 400 MG/10ML PO SUSP: 800.0000 mg | Freq: Every day | ORAL | 0 refills | Status: DC

## 2018-06-26 MED ORDER — SUCRALFATE 1 GM/10ML PO SUSP: 1.0000 g | Freq: Three times a day (TID) | ORAL | 0 refills | Status: AC

## 2018-06-26 MED ORDER — NICOTINE 21 MG/24HR TD PT24: 21.0000 mg | MEDICATED_PATCH | Freq: Every day | TRANSDERMAL | 0 refills | Status: DC

## 2018-06-26 MED ORDER — ADULT MULTIVITAMIN W/MINERALS CH: 1.0000 | ORAL_TABLET | Freq: Every day | ORAL | 0 refills | Status: DC

## 2018-06-26 NOTE — Progress Notes (Signed)
Nutrition Follow-up  DOCUMENTATION CODES:   Severe malnutrition in context of chronic illness, Underweight  INTERVENTION:   Tube Feeding:  Transition to bolus feeding today Osmolite 1.5 237 mL (1 can) 4 times daily; schedule 1 can after each meal and at bedtime; total of 4 cans of Osmolite 1.5 per day Pro-Stat 30 mL daily Provides 1520 kcals (41 kcals/kg) and 75 g of protein (2.0 g/kg), 724 mL of free water  Free water 250 mL TID via G-tube;  Total free water 1474 mL of free water  Continue MVI with Minerals daily  Creon (PERT) should be given with each bolus feeding  NUTRITION DIAGNOSIS:   Severe Malnutrition related to chronic illness(COPD) as evidenced by severe fat depletion, severe muscle depletion.  Being addressed via TF   GOAL:   Patient will meet greater than or equal to 90% of their needs  Progressing  MONITOR:   TF tolerance, Labs, Weight trends, PO intake, Skin  REASON FOR ASSESSMENT:   Consult Assessment of nutrition requirement/status  ASSESSMENT:   62 yo male with PMH of HTN, COPD, GERD, anemia, chronic pancreatitis, tonsillar cancer, tobacco & cocaine abuse, who was admitted on 9/6 with AMS, SOB, hypoglycemia.  Tolerating Osmolite 1.2 @ 55 ml/hr via G-tube Noted plan to discharge to home soon; Plan to change to bolus feedings today and assess for tolerance  Potassium and magnesium now wdl  Pt continues to eat minimally, Tube Feeding is primary source of nutrition for this patient  Pt eating limited P.O., Creon currently ordered with meals and at bedtime. Recommend Creon be given with bolus feedings.   Labs: CBGs 72-149, potassium wdl, magnesium wdl Meds: k-phos, MVI with minerals, remeron, megace, mag ox, Creon with meals and bedtime  Diet Order:   Diet Order            DIET - DYS 1 Room service appropriate? Yes; Fluid consistency: Honey Thick  Diet effective now              EDUCATION NEEDS:   No education needs have been  identified at this time  Skin:  Skin Assessment: Skin Integrity Issues: Skin Integrity Issues:: Unstageable DTI: R & L heel Stage II: n/a Unstageable: sacrum  Last BM:  9/22  Height:   Ht Readings from Last 1 Encounters:  06/09/18 5\' 5"  (1.651 m)    Weight:   Wt Readings from Last 1 Encounters:  06/26/18 36.9 kg    Ideal Body Weight:  61.8 kg  BMI:  Body mass index is 13.54 kg/m.  Estimated Nutritional Needs:   Kcal:  1400-1700 kcals   Protein:  60-80 gm  Fluid:  1.5 L   Kerman Passey MS, RD, LDN, CNSC [phone] Pager  [phone] Weekend/On-Call Pager

## 2018-06-26 NOTE — Progress Notes (Signed)
PT Cancellation Note  Patient Details Name: Russell Williamson MRN: [id number] DOB: [birth year]   Cancelled Treatment:    Reason Eval/Treat Not Completed: Other (comment)   Politely declining OOB and amb at this time;  Will continue to follow;   Roney Marion, [place] Pager [phone] Office [phone]    Colletta [place] 06/26/2018, 10:19 AM

## 2018-06-26 NOTE — Progress Notes (Signed)
Patient's great niece to the bedside for discharge instructions. Patient's niece educated on crushing medications and admin per tube. Also educated on patient's bolus feedings. Patient's dressing changed and cleansed prior to D/C. Patient with difficult transfer but was stable upon getting in the car. Patient and family educated to come back after any signs and symptoms of distress

## 2018-06-26 NOTE — Care Management Note (Addendum)
Case Management Note  Patient Details  Name: Russell Williamson MRN: [id number] Date of Birth: 02/28/1956  Subjective/Objective:                    Action/Plan:Spoke with Dr Lonny Prude, if teaching done with patient and family on tube feeds patient can discharge today. Called niece [phone]. Explained above to [name] . [name] will send her daughter to hospital this afternoon for teaching. She will bring patient's clothes and portable oxygen tank and transport patient home in car. PTAR paperwork completed and placed in shadow just in case needed. [place] bedside nurse aware and will provide teaching. Patient in agreement with above. [facility] aware discharge is today.  Consult for home health and tube feedings at home.   Discussed with Dietitian Lenard Forth, she will recommend bolus feeds today. Will await her note for recommendations for home, than enter orders.  Discussed with patient , confirmed face sheet information with patient. He lives with his niece [phone]. Explained bedside nurse here will teach him and family how to administer tube feedings before discharge.   Patient has home oxygen through [place] already.    Expected Discharge Date:                  Expected Discharge Plan:  [place]  In-House Referral:     Discharge planning Services  CM Consult  Post Acute Care Choice:  Home Health, Durable Medical Equipment Choice offered to:  Patient  DME Arranged:  Tube feeding DME Agency:  [place] Arranged:  RN, PT [facility] Agency:  [place]  Status of Service:  In process, will continue to follow  If discussed at Long Length of Stay Meetings, dates discussed:    Additional Comments:  Marilu Favre, RN 06/26/2018, 11:27 AM

## 2018-06-26 NOTE — Progress Notes (Signed)
  Speech Language Pathology Treatment: Dysphagia  Patient Details Name: Russell Williamson MRN: [id number] DOB: 1955-10-23 Today's Date: 06/26/2018 Time: 7902-4097 SLP Time Calculation (min) (ACUTE ONLY): 20 min  Assessment / Plan / Recommendation Clinical Impression  Pt found alert and upright with breakfast in bed upon entrance to room. He exhibited immediate congested cough following consumption of honey thick liquid. He required min verbal cues to recall all strategies and moderate to demonstrate safe swallow recommendations following PO intake. SLP educated pt regarding aspiration precautions and associated risks with continued PO intake, especially given pt's hx of recurrent pneumonia. Also discussed purpose of PEG and quality of life related to PO intake, encouraged pt to cease meal if coughing episodes cause discomfort and fatigue. He continued to express desire to eat for pleasure but does not appear to fully comprehend coorelation between aspiration, pna and likely repeated hospital admissions. Given max verbal and visual cues, pt also demonstrated use of Phillips Expiratory Muscle Strength Trainer (EMST) set to 5cm H2O resistance which required mod-max degree of effort to produce desired result only once out of multiple attempts. Recommend continue puree diet with honey thick liquids as pt desires and is comfortable as well as continue EMST exercises to facilitate respiratory and pharyngeal muscle strength for safe swallow. ST will continue to follow to provide further pt and family education and dysphagia treatment.    HPI HPI: Russell Williamson is a 62 y.o. male with medical history significant of hypertension, COPD, GERD, depression, anemia, chronic pancreatitis, tonsillar cancer (radiation therapy in remission), tobacco abuse, cocaine abuse, GERD, depression admitted with sepsis with right lower lobe pneumonia, hypothermia and hypoglycemia. Known to ST from prior admissions, with severe,  chronic pharyngoesophageal dysphagia. Per MBS 02/15/18, dys 1, honey by teaspoon was recommended.      SLP Plan  Continue with current plan of care       Recommendations  Diet recommendations: Dysphagia 1 (puree);Honey-thick liquid Liquids provided via: Cup Medication Administration: Crushed with puree Supervision: Full supervision/cueing for compensatory strategies;Patient able to self feed Compensations: Slow rate;Small sips/bites;Multiple dry swallows after each bite/sip;Hard cough after swallow Postural Changes and/or Swallow Maneuvers: Seated upright 90 degrees                Oral Care Recommendations: Oral care BID Follow up Recommendations: 24 hour supervision/assistance;Home health SLP SLP Visit Diagnosis: Dysphagia, pharyngeal phase (R13.13) Plan: Continue with current plan of care       GO                Jettie Booze 06/26/2018, 12:24 PM

## 2018-06-28 ENCOUNTER — Emergency Department (HOSPITAL_COMMUNITY): Payer: Medicaid Other

## 2018-06-28 ENCOUNTER — Inpatient Hospital Stay (HOSPITAL_COMMUNITY)
Admission: EM | Admit: 2018-06-28 | Discharge: 2018-07-05 | DRG: 871 | Disposition: A | Discharge status: Hospice | Payer: Medicaid Other | Attending: Internal Medicine | Admitting: Internal Medicine

## 2018-06-28 ENCOUNTER — Encounter (HOSPITAL_COMMUNITY): Payer: Self-pay | Admitting: Emergency Medicine

## 2018-06-28 DIAGNOSIS — K9423 Gastrostomy malfunction: Secondary | ICD-10-CM

## 2018-06-28 DIAGNOSIS — E876 Hypokalemia: Secondary | ICD-10-CM | POA: Diagnosis present

## 2018-06-28 DIAGNOSIS — Z7189 Other specified counseling: Secondary | ICD-10-CM

## 2018-06-28 DIAGNOSIS — R4702 Dysphasia: Secondary | ICD-10-CM | POA: Diagnosis present

## 2018-06-28 DIAGNOSIS — E890 Postprocedural hypothyroidism: Secondary | ICD-10-CM | POA: Diagnosis present

## 2018-06-28 DIAGNOSIS — K861 Other chronic pancreatitis: Secondary | ICD-10-CM | POA: Diagnosis present

## 2018-06-28 DIAGNOSIS — R1310 Dysphagia, unspecified: Secondary | ICD-10-CM | POA: Diagnosis present

## 2018-06-28 DIAGNOSIS — G9341 Metabolic encephalopathy: Secondary | ICD-10-CM | POA: Diagnosis present

## 2018-06-28 DIAGNOSIS — Z79890 Hormone replacement therapy (postmenopausal): Secondary | ICD-10-CM

## 2018-06-28 DIAGNOSIS — J189 Pneumonia, unspecified organism: Secondary | ICD-10-CM | POA: Diagnosis present

## 2018-06-28 DIAGNOSIS — R6520 Severe sepsis without septic shock: Secondary | ICD-10-CM | POA: Diagnosis present

## 2018-06-28 DIAGNOSIS — M1990 Unspecified osteoarthritis, unspecified site: Secondary | ICD-10-CM | POA: Diagnosis present

## 2018-06-28 DIAGNOSIS — J690 Pneumonitis due to inhalation of food and vomit: Secondary | ICD-10-CM | POA: Diagnosis present

## 2018-06-28 DIAGNOSIS — Z515 Encounter for palliative care: Secondary | ICD-10-CM

## 2018-06-28 DIAGNOSIS — R197 Diarrhea, unspecified: Secondary | ICD-10-CM | POA: Diagnosis present

## 2018-06-28 DIAGNOSIS — J9622 Acute and chronic respiratory failure with hypercapnia: Secondary | ICD-10-CM | POA: Diagnosis present

## 2018-06-28 DIAGNOSIS — R627 Adult failure to thrive: Secondary | ICD-10-CM | POA: Diagnosis present

## 2018-06-28 DIAGNOSIS — Z85818 Personal history of malignant neoplasm of other sites of lip, oral cavity, and pharynx: Secondary | ICD-10-CM

## 2018-06-28 DIAGNOSIS — Z22322 Carrier or suspected carrier of Methicillin resistant Staphylococcus aureus: Secondary | ICD-10-CM

## 2018-06-28 DIAGNOSIS — Z825 Family history of asthma and other chronic lower respiratory diseases: Secondary | ICD-10-CM

## 2018-06-28 DIAGNOSIS — R64 Cachexia: Secondary | ICD-10-CM | POA: Diagnosis present

## 2018-06-28 DIAGNOSIS — R109 Unspecified abdominal pain: Secondary | ICD-10-CM

## 2018-06-28 DIAGNOSIS — Z9049 Acquired absence of other specified parts of digestive tract: Secondary | ICD-10-CM

## 2018-06-28 DIAGNOSIS — Z923 Personal history of irradiation: Secondary | ICD-10-CM

## 2018-06-28 DIAGNOSIS — E039 Hypothyroidism, unspecified: Secondary | ICD-10-CM | POA: Diagnosis present

## 2018-06-28 DIAGNOSIS — G8929 Other chronic pain: Secondary | ICD-10-CM | POA: Diagnosis present

## 2018-06-28 DIAGNOSIS — E861 Hypovolemia: Secondary | ICD-10-CM | POA: Diagnosis present

## 2018-06-28 DIAGNOSIS — Z681 Body mass index (BMI) 19 or less, adult: Secondary | ICD-10-CM

## 2018-06-28 DIAGNOSIS — F329 Major depressive disorder, single episode, unspecified: Secondary | ICD-10-CM | POA: Diagnosis present

## 2018-06-28 DIAGNOSIS — D531 Other megaloblastic anemias, not elsewhere classified: Secondary | ICD-10-CM | POA: Diagnosis present

## 2018-06-28 DIAGNOSIS — Y95 Nosocomial condition: Secondary | ICD-10-CM | POA: Diagnosis present

## 2018-06-28 DIAGNOSIS — Z931 Gastrostomy status: Secondary | ICD-10-CM

## 2018-06-28 DIAGNOSIS — J9811 Atelectasis: Secondary | ICD-10-CM | POA: Diagnosis present

## 2018-06-28 DIAGNOSIS — A419 Sepsis, unspecified organism: Principal | ICD-10-CM | POA: Diagnosis present

## 2018-06-28 DIAGNOSIS — J90 Pleural effusion, not elsewhere classified: Secondary | ICD-10-CM

## 2018-06-28 DIAGNOSIS — Z79891 Long term (current) use of opiate analgesic: Secondary | ICD-10-CM

## 2018-06-28 DIAGNOSIS — K219 Gastro-esophageal reflux disease without esophagitis: Secondary | ICD-10-CM | POA: Diagnosis present

## 2018-06-28 DIAGNOSIS — Z79899 Other long term (current) drug therapy: Secondary | ICD-10-CM

## 2018-06-28 DIAGNOSIS — D539 Nutritional anemia, unspecified: Secondary | ICD-10-CM | POA: Diagnosis present

## 2018-06-28 DIAGNOSIS — I959 Hypotension, unspecified: Secondary | ICD-10-CM | POA: Diagnosis present

## 2018-06-28 DIAGNOSIS — J449 Chronic obstructive pulmonary disease, unspecified: Secondary | ICD-10-CM | POA: Diagnosis present

## 2018-06-28 DIAGNOSIS — F17210 Nicotine dependence, cigarettes, uncomplicated: Secondary | ICD-10-CM | POA: Diagnosis present

## 2018-06-28 DIAGNOSIS — I4891 Unspecified atrial fibrillation: Secondary | ICD-10-CM | POA: Diagnosis present

## 2018-06-28 DIAGNOSIS — J9621 Acute and chronic respiratory failure with hypoxia: Secondary | ICD-10-CM | POA: Diagnosis present

## 2018-06-28 DIAGNOSIS — Z66 Do not resuscitate: Secondary | ICD-10-CM | POA: Diagnosis present

## 2018-06-28 DIAGNOSIS — D638 Anemia in other chronic diseases classified elsewhere: Secondary | ICD-10-CM | POA: Diagnosis present

## 2018-06-28 DIAGNOSIS — G4700 Insomnia, unspecified: Secondary | ICD-10-CM | POA: Diagnosis present

## 2018-06-28 DIAGNOSIS — J441 Chronic obstructive pulmonary disease with (acute) exacerbation: Secondary | ICD-10-CM | POA: Diagnosis present

## 2018-06-28 DIAGNOSIS — E43 Unspecified severe protein-calorie malnutrition: Secondary | ICD-10-CM | POA: Diagnosis present

## 2018-06-28 DIAGNOSIS — I10 Essential (primary) hypertension: Secondary | ICD-10-CM | POA: Diagnosis present

## 2018-06-28 DIAGNOSIS — Z9981 Dependence on supplemental oxygen: Secondary | ICD-10-CM

## 2018-06-28 DIAGNOSIS — E871 Hypo-osmolality and hyponatremia: Secondary | ICD-10-CM | POA: Diagnosis present

## 2018-06-28 DIAGNOSIS — J9601 Acute respiratory failure with hypoxia: Secondary | ICD-10-CM

## 2018-06-28 LAB — CBC WITH DIFFERENTIAL/PLATELET
ABS IMMATURE GRANULOCYTES: 0.1 10*3/uL (ref 0.0–0.1)
BASOS ABS: 0 10*3/uL (ref 0.0–0.1)
Basophils Relative: 0 %
Eosinophils Absolute: 0 10*3/uL (ref 0.0–0.7)
Eosinophils Relative: 0 %
HCT: 24.2 % — ABNORMAL LOW (ref 39.0–52.0)
HEMOGLOBIN: 7.8 g/dL — AB (ref 13.0–17.0)
Immature Granulocytes: 1 %
LYMPHS PCT: 3 %
Lymphs Abs: 0.3 10*3/uL — ABNORMAL LOW (ref 0.7–4.0)
MCH: 34.5 pg — ABNORMAL HIGH (ref 26.0–34.0)
MCHC: 32.2 g/dL (ref 30.0–36.0)
MCV: 107.1 fL — ABNORMAL HIGH (ref 78.0–100.0)
Monocytes Absolute: 1 10*3/uL (ref 0.1–1.0)
Monocytes Relative: 9 %
NEUTROS ABS: 10.1 10*3/uL — AB (ref 1.7–7.7)
NEUTROS PCT: 87 %
Platelets: 354 10*3/uL (ref 150–400)
RBC: 2.26 MIL/uL — AB (ref 4.22–5.81)
RDW: 15.1 % (ref 11.5–15.5)
WBC: 11.5 10*3/uL — AB (ref 4.0–10.5)

## 2018-06-28 LAB — COMPREHENSIVE METABOLIC PANEL
ALBUMIN: 1.8 g/dL — AB (ref 3.5–5.0)
ALT: 26 U/L (ref 0–44)
ANION GAP: 9 (ref 5–15)
AST: 28 U/L (ref 15–41)
Alkaline Phosphatase: 86 U/L (ref 38–126)
BILIRUBIN TOTAL: 0.7 mg/dL (ref 0.3–1.2)
BUN: 15 mg/dL (ref 8–23)
CO2: 29 mmol/L (ref 22–32)
Calcium: 6.8 mg/dL — ABNORMAL LOW (ref 8.9–10.3)
Chloride: 92 mmol/L — ABNORMAL LOW (ref 98–111)
Creatinine, Ser: 0.5 mg/dL — ABNORMAL LOW (ref 0.61–1.24)
GFR calc Af Amer: >60 mL/min (ref >60)
Glucose, Bld: 160 mg/dL — ABNORMAL HIGH (ref 70–99)
POTASSIUM: 3.6 mmol/L (ref 3.5–5.1)
Sodium: 130 mmol/L — ABNORMAL LOW (ref 135–145)
TOTAL PROTEIN: 5.5 g/dL — AB (ref 6.5–8.1)

## 2018-06-28 LAB — URINALYSIS, ROUTINE W REFLEX MICROSCOPIC
Bilirubin Urine: NEGATIVE
Glucose, UA: NEGATIVE mg/dL
HGB URINE DIPSTICK: NEGATIVE
KETONES UR: NEGATIVE mg/dL
Leukocytes, UA: NEGATIVE
NITRITE: NEGATIVE
PROTEIN: 30 mg/dL — AB
Specific Gravity, Urine: 1.02 (ref 1.005–1.030)
pH: 5 (ref 5.0–8.0)

## 2018-06-28 LAB — I-STAT ARTERIAL BLOOD GAS, ED
Acid-Base Excess: 7 mmol/L — ABNORMAL HIGH (ref 0.0–2.0)
Bicarbonate: 34.8 mmol/L — ABNORMAL HIGH (ref 20.0–28.0)
O2 Saturation: 100 %
PCO2 ART: 67.2 mmHg — AB (ref 32.0–48.0)
PH ART: 7.322 — AB (ref 7.350–7.450)
TCO2: 37 mmol/L — AB (ref 22–32)
pO2, Arterial: 231 mmHg — ABNORMAL HIGH (ref 83.0–108.0)

## 2018-06-28 LAB — I-STAT CG4 LACTIC ACID, ED: LACTIC ACID, VENOUS: 2.44 mmol/L — AB (ref 0.5–1.9)

## 2018-06-28 LAB — I-STAT CHEM 8, ED
BUN: 20 mg/dL (ref 8–23)
CHLORIDE: 86 mmol/L — AB (ref 98–111)
Calcium, Ion: 0.95 mmol/L — ABNORMAL LOW (ref 1.15–1.40)
Creatinine, Ser: 0.5 mg/dL — ABNORMAL LOW (ref 0.61–1.24)
GLUCOSE: 163 mg/dL — AB (ref 70–99)
HCT: 20 % — ABNORMAL LOW (ref 39.0–52.0)
Hemoglobin: 6.8 g/dL — CL (ref 13.0–17.0)
POTASSIUM: 3.7 mmol/L (ref 3.5–5.1)
SODIUM: 129 mmol/L — AB (ref 135–145)
TCO2: 36 mmol/L — ABNORMAL HIGH (ref 22–32)

## 2018-06-28 LAB — PROTIME-INR
INR: 1.25
Prothrombin Time: 15.6 seconds — ABNORMAL HIGH (ref 11.4–15.2)

## 2018-06-28 LAB — BRAIN NATRIURETIC PEPTIDE: B NATRIURETIC PEPTIDE 5: 2215.5 pg/mL — AB (ref 0.0–100.0)

## 2018-06-28 LAB — TROPONIN I: Troponin I: 0.05 ng/mL (ref <0.03)

## 2018-06-28 MED ORDER — ACETAMINOPHEN 650 MG RE SUPP: 650.0000 mg | Freq: Once | RECTAL | Status: DC

## 2018-06-28 MED ORDER — SODIUM CHLORIDE 0.9 % IV SOLN
2.0000 g | Freq: Once | INTRAVENOUS | Status: AC
  Administered 2018-06-28: 2 g via INTRAVENOUS
  Filled 2018-06-28: qty 2

## 2018-06-28 MED ORDER — VANCOMYCIN HCL IN DEXTROSE 1-5 GM/200ML-% IV SOLN
1000.0000 mg | Freq: Once | INTRAVENOUS | Status: AC
  Administered 2018-06-28: 1000 mg via INTRAVENOUS
  Filled 2018-06-28: qty 200

## 2018-06-28 MED ORDER — ACETAMINOPHEN 325 MG PO TABS
650.0000 mg | ORAL_TABLET | Freq: Once | ORAL | Status: AC
  Administered 2018-06-28: 650 mg via ORAL
  Filled 2018-06-28: qty 2

## 2018-06-28 NOTE — ED Triage Notes (Signed)
Pt presents with [facility] from home where family called [phone] for patient being unresponsive; pt discharged from inpatient unit x 2 days ago after having trouble regulating CBG; pt alert to verbal stimuli; facial swelling noted; patient with tachypnea and accessory muscle use; no advanced directives per family

## 2018-06-28 NOTE — ED Provider Notes (Signed)
[facility] EMERGENCY DEPARTMENT Provider Note   CSN: [id number] Arrival date & time: 06/28/18  2208     History   Chief Complaint Chief Complaint  Patient presents with  . Altered Mental Status  . Shortness of Breath  . Failure To Thrive    HPI Russell Williamson is a 62 y.o. male.  HPI  Patient is a 62yo male with PMHx of HTN, COPD, depression, chronic pancreatitis, tonsillar cancer s/p radiation in remission, and cocaine abuse who presents with unresponsiveness per family while at home.  On EMS arrival patient was hypoxic to 80% and placed on a NRB.  He was slightly hypotensive as systolic 671I and tachycardic to 110s. History limited given the patient's moderate respiratory distress and AMS.  Patient alert to verbal response and following commands.  Currently on NRB satting 94%.  Patient has had similar admissions for the same.  Currently he reports a cough otherwise is unable to provide remainder of hx.  No family at bedside and hx limited given patient's condition.  Per chart review patient recently discharged on the 06/26/18 for sepsis 2/2 aspiration PNA.  Hospital course was complicated by hypoglycemia requiring D5 infusion, dysphasia/cachexia requiring PEG tube placement, and severe hypothyroidism.  Required 1 unit PRBCs due to Hgb of 6.9 on 06/24/2018.    Past Medical History:  Diagnosis Date  . Anemia   . Arthritis   . Cervical adenopathy 04/26/2012  . Chronic back pain    lower  . Chronic pancreatitis ([place])   . Constipation   . COPD (chronic obstructive pulmonary disease) ([place])   . Daily headache 05/23/2012   "small ones"  . Deficiency anemia 04/15/2016  . Dyspnea    with exertion  . G tube feedings (HCC)    hx of  feeding tube in stomach  . GERD (gastroesophageal reflux disease)   . History of blood transfusion ~ 2004   "from the pancreatitis"  . History of radiation therapy 02/02/2011-03/22/2011   head/neck,left tonsil  . Hx of radiation  therapy 02/02/11 to 03/22/11   L tonsil  . Hypertension   . Insomnia   . 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 Pancytopenia ([place]) 02/28/2014  . Poor venous access 04/28/2016  . Seizures ([place])    alcohol-related last seizure 3 yrs ago  . Thrush 11/02/2013  . Tonsillar cancer ([place]) 12/15/2010   Left  . Urinary hesitancy     Patient Active Problem List   Diagnosis Date Noted  . Failure to thrive (0-17)   . Advance care planning   . Abdominal pain 06/10/2018  . Acute metabolic encephalopathy 45/80/9983  . Hypoglycemia 06/09/2018  . Aspiration pneumonia ([place]) 06/09/2018  . Lobar pneumonia ([place]) 06/09/2018  . Abnormal LFTs 06/09/2018  . Acute respiratory failure with hypoxia ([place]) 02/14/2018  . Glasgow coma scale total score 3-8 ([place])   . Respiratory failure ([place])   . Sepsis ([place])   . Pressure injury of skin 02/08/2018  . COPD with chronic bronchitis ([place]) 02/06/2018  . CAP (community acquired pneumonia) 02/06/2018  . Syncope 02/06/2018  . Hyponatremia 02/06/2018  . Dehydration 02/06/2018  . Fall at home, initial encounter 02/06/2018  . Tobacco abuse 02/06/2018  . Protein-calorie malnutrition, severe 07/03/2017  . Abnormal EKG 07/02/2017  . Abdominal pain, epigastric   . COPD exacerbation ([place]) 01/27/2017  . Oral thrush 01/27/2017  . 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 Encounter for intubation 05/07/2016  . Port catheter in place 05/05/2016  .  Poor venous access 04/28/2016  . Deficiency anemia 04/15/2016  . Chronic leukopenia 04/19/2015  . Anemia in chronic illness 04/19/2015  . Weight loss, unintentional 04/19/2015  . Relapsing chronic pancreatitis ([place]) 01/17/2015  . Diarrhea 09/04/2014  . 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 Malnutrition of moderate degree ([place]) 09/03/2014  . Hypokalemia 09/02/2014  . Chronic alcoholic pancreatitis ([place]) 09/01/2014  . Mucositis 06/17/2014  . Chronic neck pain 06/17/2014  . S/P gastrostomy ([place]) 06/17/2014  .  Nicotine abuse 02/28/2014  . Pancytopenia ([place]) 02/28/2014  . Hypothyroidism (acquired) 11/02/2013  . Hypothyroidism 10/29/2013  . 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 Cervical adenopathy 04/26/2012  . Hiatal hernia   . Hx of radiation therapy   . HTN (hypertension) 10/16/2011  . History of cancer tonsil 08/20/2011    Past Surgical History:  Procedure Laterality Date  . BILE DUCT STENT PLACEMENT     hx of  . CHOLECYSTECTOMY  04/2005  . DIRECT LARYNGOSCOPY  05/23/2012   Procedure: DIRECT LARYNGOSCOPY;  Surgeon: Rozetta Nunnery, MD;  Location: [place];  Service: ENT;  Laterality: N/A;  . DIRECT LARYNGOSCOPY N/A 03/23/2013   Procedure: DIRECT LARYNGOSCOPY;  Surgeon: Rozetta Nunnery, MD;  Location: [place];  Service: ENT;  Laterality: N/A;  . ESOPHAGEAL DILATION N/A 03/23/2013   Procedure: ESOPHAGEAL DILATION;  Surgeon: Rozetta Nunnery, MD;  Location: [facility];  Service: ENT;  Laterality: N/A;  . ESOPHAGOGASTRODUODENOSCOPY (EGD) WITH PROPOFOL N/A 03/01/2017   Procedure: ESOPHAGOGASTRODUODENOSCOPY (EGD) WITH PROPOFOL;  Surgeon: Ladene Artist, MD;  Location: [facility];  Service: Endoscopy;  Laterality: N/A;  . FLEXIBLE SIGMOIDOSCOPY N/A 03/01/2017   Procedure: FLEXIBLE SIGMOIDOSCOPY;  Surgeon: Ladene Artist, MD;  Location: [facility];  Service: Endoscopy;  Laterality: N/A;  . IR GASTROSTOMY TUBE MOD SED  06/19/2018  . IR GENERIC HISTORICAL  04/29/2016   IR US GUIDE VASC ACCESS RIGHT 04/29/2016 [facility]  . IR GENERIC HISTORICAL  04/29/2016   IR FLUORO GUIDE CV LINE RIGHT 04/29/2016 [facility]  . IR GENERIC HISTORICAL  05/12/2016   IR US GUIDE VASC ACCESS LEFT 05/12/2016 [facility]  . IR GENERIC HISTORICAL  05/12/2016   IR FLUORO GUIDE CV LINE LEFT 05/12/2016 [facility]  . IR GENERIC HISTORICAL  06/28/2016   IR US GUIDE VASC ACCESS RIGHT 06/28/2016 Sandi Mariscal, MD [facility]  . IR GENERIC HISTORICAL  06/28/2016   IR FLUORO GUIDE PORT INSERTION  RIGHT 06/28/2016 Sandi Mariscal, MD [facility]  . IR REMOVAL TUN ACCESS W/ PORT W/O FL MOD SED  02/10/2017  . MASS BIOPSY  05/23/2012   Procedure: NECK MASS BIOPSY;  Surgeon: Rozetta Nunnery, MD;  Location: [place];  Service: ENT;  Laterality: N/A;  . RADICAL NECK DISSECTION  05/23/2012   w/mass excision  . RADICAL NECK DISSECTION  05/23/2012   Procedure: RADICAL NECK DISSECTION;  Surgeon: Rozetta Nunnery, MD;  Location: [place];  Service: ENT;  Laterality: Left;  . TIBIA FRACTURE SURGERY  1990's   left        Home Medications    Prior to Admission medications   Medication Sig Start Date End Date Taking? Authorizing Provider  albuterol (PROAIR HFA) 108 (90 Base) MCG/ACT inhaler Inhale 2 puffs into the lungs every 4 (four) hours as needed for wheezing or shortness of breath.    [provider]  Amino Acids-Protein Hydrolys (FEEDING SUPPLEMENT, PRO-STAT SUGAR FREE 64,) LIQD Place 30 mLs into feeding tube daily. 06/27/18   Oretha Milch  D, MD  amLODipine (NORVASC) 2.5 MG tablet Take 2.5 mg by mouth daily. 12/24/16   [provider]  collagenase (SANTYL) ointment Apply topically daily. 06/27/18   Desiree Hane, MD  feeding supplement, ENSURE ENLIVE, (ENSURE ENLIVE) LIQD Take 237 mLs by mouth 3 (three) times daily between meals. Patient taking differently: Take 474 mLs by mouth 3 (three) times daily between meals. 2 cans 02/10/18   Elodia Florence., MD  folic acid (FOLVITE) 1 MG tablet Take 1 mg by mouth daily.  05/14/15   [provider]  guaiFENesin-dextromethorphan (ROBITUSSIN DM) 100-10 MG/5ML syrup Take 15 mLs by mouth every 6 (six) hours as needed for cough. 06/26/18   Desiree Hane, MD  levothyroxine (SYNTHROID, LEVOTHROID) 100 MCG tablet Place 1 tablet (100 mcg total) into feeding tube daily before breakfast. 06/27/18   Oretha Milch D, MD  lipase/protease/amylase (CREON) 12000 units CPEP capsule Take 1 capsule (12,000 Units total) by mouth 4 (four)  times daily. Ok to open capsule and mix with applesauce 06/26/18   Oretha Milch D, MD  magnesium oxide (MAG-OX) 400 (241.3 Mg) MG tablet Place 2 tablets (800 mg total) into feeding tube 2 (two) times daily. 06/26/18   Desiree Hane, MD  Maltodextrin-Xanthan Gum ([place]) POWD Add to liquids to make nectar thick consistency for dysphagia Patient not taking: Reported on 04/23/2018 02/10/18   Elodia Florence., MD  megestrol (MEGACE) 400 MG/10ML suspension Place 20 mLs (800 mg total) into feeding tube daily. 06/27/18   Oretha Milch D, MD  mirtazapine (REMERON) 30 MG tablet Take 30 mg by mouth at bedtime.    [provider]  Multiple Vitamin (MULTIVITAMIN WITH MINERALS) TABS tablet Place 1 tablet into feeding tube daily. 06/27/18   Oretha Milch D, MD  nicotine (NICODERM CQ - DOSED IN MG/24 HOURS) 21 mg/24hr patch Place 1 patch (21 mg total) onto the skin daily. 06/27/18   Desiree Hane, MD  oxyCODONE (ROXICODONE) 15 MG immediate release tablet Take 1 tablet (15 mg total) by mouth every 8 (eight) hours as needed for pain. Patient taking differently: Take 15 mg by mouth every 4 (four) hours as needed for pain.  02/17/18   Domenic Polite, MD  OXYGEN Inhale 3 L into the lungs as needed (shortness of breath).    [provider]  pantoprazole sodium (PROTONIX) 40 mg/20 mL PACK Place 10 mLs (20 mg total) into feeding tube 2 (two) times daily. 06/26/18   Desiree Hane, MD  phosphorus (K PHOS NEUTRAL) [phone] MG tablet Place 2 tablets (500 mg total) into feeding tube 4 (four) times daily -  with meals and at bedtime. 06/26/18   Desiree Hane, MD  promethazine (PHENERGAN) 25 MG tablet Take 1 tablet (25 mg total) by mouth every 6 (six) hours as needed for nausea or vomiting. 05/27/18   Law, Bea Graff, PA-C  sucralfate (CARAFATE) 1 GM/10ML suspension Place 10 mLs (1 g total) into feeding tube 4 (four) times daily -  with meals and at bedtime. 06/26/18   Desiree Hane, MD  traMADol (ULTRAM) 50 MG tablet Place 1 tablet (50 mg total) into feeding tube every 6 (six) hours as needed for up to 5 days for moderate pain. 06/26/18 07/01/18  Desiree Hane, MD  Water For Irrigation, Sterile (FREE WATER) SOLN Place 250 mLs into feeding tube every 8 (eight) hours. 06/26/18   Desiree Hane, MD    Family History Family History  Problem Relation Age of Onset  . COPD Mother   . Colon cancer Neg Hx     Social History Social History   Tobacco Use  . Smoking status: Current Every Day Smoker    Packs/day: 0.25    Years: 40.00    Pack years: 10.00    Types: Cigarettes  . Smokeless tobacco: Never Used  . Tobacco comment: hx 1/2 -1 PPD  Substance Use Topics  . Alcohol use: No    Comment: 05/23/2012 hx alcohol abuse, quit ~ 2007  . Drug use: No    Types: Marijuana    Comment: 05/23/2012 "used marijuana in my teens"     Allergies   Patient has no known allergies.   Review of Systems Review of Systems  Unable to perform ROS: Mental status change     Physical Exam Updated Vital Signs BP (!) 135/92   Pulse (!) 110   Temp (!) 100.4 F (38 C) (Rectal)   Resp (!) 23   SpO2 91%   Physical Exam  Constitutional: He appears cachectic. He appears toxic. He appears ill. Face mask in place.  Anasarca  HENT:  Head: Normocephalic and atraumatic.  Bilateral periorbital edema.  Eyes: Pupils are equal, round, and reactive to light. Conjunctivae are normal. Right eye exhibits chemosis. Left eye exhibits chemosis.  Neck: Neck supple.  Cardiovascular: Normal rate and regular rhythm.  No murmur heard. Pulmonary/Chest: Tachypnea noted. He is in respiratory distress.  Coarse rhonchorous breath sounds throughout  Abdominal: Soft. He exhibits no distension. There is no tenderness. There is no guarding.  PEG tube in place C/D/I  Neurological: He is alert. GCS eye subscore is 3. GCS verbal subscore is 4. GCS motor subscore is 6.  Decreased strength  throughout however able to move all 4 extremities.  Skin: Skin is warm and dry.  Psychiatric: He has a normal mood and affect.  Nursing note and vitals reviewed.    ED Treatments / Results  Labs (all labs ordered are listed, but only abnormal results are displayed) Labs Reviewed  COMPREHENSIVE METABOLIC PANEL - Abnormal; Notable for the following components:      Result Value   Sodium 130 (*)    Chloride 92 (*)    Glucose, Bld 160 (*)    Creatinine, Ser 0.50 (*)    Calcium 6.8 (*)    Total Protein 5.5 (*)    Albumin 1.8 (*)    All other components within normal limits  CBC WITH DIFFERENTIAL/PLATELET - Abnormal; Notable for the following components:   WBC 11.5 (*)    RBC 2.26 (*)    Hemoglobin 7.8 (*)    HCT 24.2 (*)    MCV 107.1 (*)    MCH 34.5 (*)    Neutro Abs 10.1 (*)    Lymphs Abs 0.3 (*)    All other components within normal limits  URINALYSIS, ROUTINE W REFLEX MICROSCOPIC - Abnormal; Notable for the following components:   Color, Urine AMBER (*)    APPearance HAZY (*)    Protein, ur 30 (*)    Bacteria, UA RARE (*)    All other components within normal limits  BRAIN NATRIURETIC PEPTIDE - Abnormal; Notable for the following components:   B Natriuretic Peptide 2,215.5 (*)    All other components within normal limits  TROPONIN I - Abnormal; Notable for the following components:   Troponin I 0.05 (*)    All other components within normal limits  PROTIME-INR - Abnormal; Notable for the  following components:   Prothrombin Time 15.6 (*)    All other components within normal limits  I-STAT CG4 LACTIC ACID, ED - Abnormal; Notable for the following components:   Lactic Acid, Venous 2.44 (*)    All other components within normal limits  I-STAT CHEM 8, ED - Abnormal; Notable for the following components:   Sodium 129 (*)    Chloride 86 (*)    Creatinine, Ser 0.50 (*)    Glucose, Bld 163 (*)    Calcium, Ion 0.95 (*)    TCO2 36 (*)    Hemoglobin 6.8 (*)    HCT 20.0 (*)     All other components within normal limits  I-STAT ARTERIAL BLOOD GAS, ED - Abnormal; Notable for the following components:   pH, Arterial 7.322 (*)    pCO2 arterial 67.2 (*)    pO2, Arterial 231.0 (*)    Bicarbonate 34.8 (*)    TCO2 37 (*)    Acid-Base Excess 7.0 (*)    All other components within normal limits  CULTURE, BLOOD (ROUTINE X 2)  CULTURE, BLOOD (ROUTINE X 2)  URINE CULTURE  TSH  CBG MONITORING, ED  I-STAT CG4 LACTIC ACID, ED  TYPE AND SCREEN    EKG EKG Interpretation  Date/Time:  Wednesday June 28 2018 22:28:43 EDT Ventricular Rate:  116 PR Interval:    QRS Duration: 87 QT Interval:  328 QTC Calculation: 456 R Axis:   76 Text Interpretation:  Sinus tachycardia Paired ventricular premature complexes Aberrant conduction of SV complex(es) Low voltage, extremity and precordial leads Baseline wander in lead(s) V3 Poor data quality in current ECG precludes serial comparison Confirmed by Sherwood Gambler [id number]) on 06/28/2018 10:39:59 PM   Radiology Dg Chest Port 1 View  Result Date: 06/28/2018 CLINICAL DATA:  Unresponsive and hypoxic. EXAM: PORTABLE CHEST 1 VIEW COMPARISON:  06/26/2018 FINDINGS: Mild cardiac enlargement. Streaky parenchymal opacities at the left lung base with air bronchograms may reflect areas of atelectasis and/or pneumonia. Moderate right and small left pleural effusions are redemonstrated slightly increased on the left. Emphysematous hyperinflation of the lungs, upper lobe predominant. No acute osseous abnormality. IMPRESSION: Moderate right and small left pleural effusions, slightly increased on the left and stable on the right since prior. Subsegmental atelectasis and/or pneumonia at the left lung base. Emphysematous hyperinflation of the lungs bilaterally. Electronically Signed   By: Ashley Royalty M.D.   On: 06/28/2018 22:44    Procedures Procedures (including critical care time)  Medications Ordered in ED Medications  fentaNYL (SUBLIMAZE)  injection 50 mcg (0 mcg Intravenous Hold 06/29/18 0030)  iopamidol (ISOVUE-300) 61 % injection (has no administration in time range)  vancomycin (VANCOCIN) IVPB 1000 mg/200 mL premix (0 mg Intravenous Stopped 06/29/18 0007)  ceFEPIme (MAXIPIME) 2 g in sodium chloride 0.9 % 100 mL IVPB (0 g Intravenous Stopped 06/28/18 2335)  acetaminophen (TYLENOL) tablet 650 mg (650 mg Oral Given 06/28/18 2304)     Initial Impression / Assessment and Plan / ED Course  I have reviewed the triage vital signs and the nursing notes.  Pertinent labs & imaging results that were available during my care of the patient were reviewed by me and considered in my medical decision making (see chart for details).     Patient is a 62yo male with PMHx of HTN, COPD, depression, chronic pancreatitis, tonsillar cancer s/p radiation in remission, and cocaine abuse who presents with unresponsiveness per family while at home.  On EMS arrival patient was hypoxic to 80% and  placed on a NRB.  Recent admission for the same with discharge 2 days ago.  On arrival patient GCS 12-13.  He is alert and following commands.  He is febrile and tachycardic however normotensive on a NRB satting 98%.  NRB was subsequently deescalated to 5L [place] as he was protecting his airway.  Exam as above significant for a chronically ill appearing cachetic African American male.  He has coarse breath sounds throughout and a c/d/i PEG tube.  Abdomen is soft.  He has diffuse anasarca.  Code sepsis initiated.  IV access obtained with US guidance.  ABG with mild respiratory acidosis as pH 7.32 and pCO2 67.    Patient found to have a PNA at the left lung base on CXR. Small bilateral pleural effusions also noted (R stable and L slightly increased in size).  He was given vanc, cefepime, and tylenol.  UA pending.  Will not pursue CTH at this time as patient is immobile and responding to commands.  Remainder of labs significant for lactic acidosis of 2.4, hyponatremia of 130,  leukocytosis of 11.5, and anemia of 7.8 (decreased from 3 days ago at 9.5).  Given IVF bolus.  Will not transfuse at this time.    Discussed case with hospitalist service who will admit for acute hypoxic respiratory failure and severe sepsis 2/2 PNA.  Final Clinical Impressions(s) / ED Diagnoses   Final diagnoses:  Acute respiratory failure with hypoxia [facility])  Healthcare-associated pneumonia    ED Discharge Orders    None       Fabian November, MD 06/29/18 1959    Sherwood Gambler, MD 07/03/18 [phone]

## 2018-06-28 NOTE — ED Notes (Signed)
I/O cath for urine

## 2018-06-29 ENCOUNTER — Inpatient Hospital Stay (HOSPITAL_COMMUNITY): Payer: Medicaid Other

## 2018-06-29 ENCOUNTER — Encounter (HOSPITAL_COMMUNITY): Payer: Self-pay | Admitting: Internal Medicine

## 2018-06-29 DIAGNOSIS — Z515 Encounter for palliative care: Secondary | ICD-10-CM | POA: Diagnosis not present

## 2018-06-29 DIAGNOSIS — R627 Adult failure to thrive: Secondary | ICD-10-CM | POA: Diagnosis present

## 2018-06-29 DIAGNOSIS — E890 Postprocedural hypothyroidism: Secondary | ICD-10-CM | POA: Diagnosis present

## 2018-06-29 DIAGNOSIS — E43 Unspecified severe protein-calorie malnutrition: Secondary | ICD-10-CM | POA: Diagnosis present

## 2018-06-29 DIAGNOSIS — J9601 Acute respiratory failure with hypoxia: Secondary | ICD-10-CM

## 2018-06-29 DIAGNOSIS — Z923 Personal history of irradiation: Secondary | ICD-10-CM | POA: Diagnosis not present

## 2018-06-29 DIAGNOSIS — R609 Edema, unspecified: Secondary | ICD-10-CM | POA: Diagnosis not present

## 2018-06-29 DIAGNOSIS — G9341 Metabolic encephalopathy: Secondary | ICD-10-CM

## 2018-06-29 DIAGNOSIS — J690 Pneumonitis due to inhalation of food and vomit: Secondary | ICD-10-CM | POA: Diagnosis present

## 2018-06-29 DIAGNOSIS — J189 Pneumonia, unspecified organism: Secondary | ICD-10-CM | POA: Diagnosis present

## 2018-06-29 DIAGNOSIS — A419 Sepsis, unspecified organism: Secondary | ICD-10-CM | POA: Diagnosis present

## 2018-06-29 DIAGNOSIS — E876 Hypokalemia: Secondary | ICD-10-CM | POA: Diagnosis present

## 2018-06-29 DIAGNOSIS — R1013 Epigastric pain: Secondary | ICD-10-CM

## 2018-06-29 DIAGNOSIS — J90 Pleural effusion, not elsewhere classified: Secondary | ICD-10-CM | POA: Diagnosis present

## 2018-06-29 DIAGNOSIS — R1310 Dysphagia, unspecified: Secondary | ICD-10-CM | POA: Diagnosis present

## 2018-06-29 DIAGNOSIS — Z85818 Personal history of malignant neoplasm of other sites of lip, oral cavity, and pharynx: Secondary | ICD-10-CM | POA: Diagnosis not present

## 2018-06-29 DIAGNOSIS — Z931 Gastrostomy status: Secondary | ICD-10-CM | POA: Diagnosis not present

## 2018-06-29 DIAGNOSIS — D638 Anemia in other chronic diseases classified elsewhere: Secondary | ICD-10-CM | POA: Diagnosis present

## 2018-06-29 DIAGNOSIS — E871 Hypo-osmolality and hyponatremia: Secondary | ICD-10-CM | POA: Diagnosis present

## 2018-06-29 DIAGNOSIS — J9622 Acute and chronic respiratory failure with hypercapnia: Secondary | ICD-10-CM | POA: Diagnosis present

## 2018-06-29 DIAGNOSIS — R6520 Severe sepsis without septic shock: Secondary | ICD-10-CM | POA: Diagnosis present

## 2018-06-29 DIAGNOSIS — J441 Chronic obstructive pulmonary disease with (acute) exacerbation: Secondary | ICD-10-CM | POA: Diagnosis present

## 2018-06-29 DIAGNOSIS — Z7189 Other specified counseling: Secondary | ICD-10-CM | POA: Diagnosis not present

## 2018-06-29 DIAGNOSIS — K861 Other chronic pancreatitis: Secondary | ICD-10-CM | POA: Diagnosis present

## 2018-06-29 DIAGNOSIS — J9621 Acute and chronic respiratory failure with hypoxia: Secondary | ICD-10-CM | POA: Diagnosis present

## 2018-06-29 DIAGNOSIS — Z66 Do not resuscitate: Secondary | ICD-10-CM | POA: Diagnosis present

## 2018-06-29 DIAGNOSIS — J449 Chronic obstructive pulmonary disease, unspecified: Secondary | ICD-10-CM | POA: Diagnosis not present

## 2018-06-29 DIAGNOSIS — Z681 Body mass index (BMI) 19 or less, adult: Secondary | ICD-10-CM | POA: Diagnosis not present

## 2018-06-29 DIAGNOSIS — Y95 Nosocomial condition: Secondary | ICD-10-CM | POA: Diagnosis present

## 2018-06-29 DIAGNOSIS — D531 Other megaloblastic anemias, not elsewhere classified: Secondary | ICD-10-CM | POA: Diagnosis present

## 2018-06-29 DIAGNOSIS — R64 Cachexia: Secondary | ICD-10-CM | POA: Diagnosis present

## 2018-06-29 DIAGNOSIS — J9811 Atelectasis: Secondary | ICD-10-CM | POA: Diagnosis present

## 2018-06-29 HISTORY — PX: IR THORACENTESIS ASP PLEURAL SPACE W/IMG GUIDE: IMG5380

## 2018-06-29 LAB — CBC WITH DIFFERENTIAL/PLATELET
ABS IMMATURE GRANULOCYTES: 0.1 10*3/uL (ref 0.0–0.1)
BASOS ABS: 0 10*3/uL (ref 0.0–0.1)
BASOS PCT: 0 %
Eosinophils Absolute: 0 10*3/uL (ref 0.0–0.7)
Eosinophils Relative: 0 %
HCT: 23.4 % — ABNORMAL LOW (ref 39.0–52.0)
Hemoglobin: 7.4 g/dL — ABNORMAL LOW (ref 13.0–17.0)
Immature Granulocytes: 1 %
Lymphocytes Relative: 2 %
Lymphs Abs: 0.3 10*3/uL — ABNORMAL LOW (ref 0.7–4.0)
MCH: 33.8 pg (ref 26.0–34.0)
MCHC: 31.6 g/dL (ref 30.0–36.0)
MCV: 106.8 fL — ABNORMAL HIGH (ref 78.0–100.0)
MONO ABS: 1.5 10*3/uL — AB (ref 0.1–1.0)
Monocytes Relative: 12 %
NEUTROS ABS: 11.5 10*3/uL — AB (ref 1.7–7.7)
NEUTROS PCT: 85 %
PLATELETS: 381 10*3/uL (ref 150–400)
RBC: 2.19 MIL/uL — ABNORMAL LOW (ref 4.22–5.81)
RDW: 15.4 % (ref 11.5–15.5)
WBC: 13.4 10*3/uL — ABNORMAL HIGH (ref 4.0–10.5)

## 2018-06-29 LAB — I-STAT ARTERIAL BLOOD GAS, ED
Acid-Base Excess: 6 mmol/L — ABNORMAL HIGH (ref 0.0–2.0)
Acid-Base Excess: 10 mmol/L — ABNORMAL HIGH (ref 0.0–2.0)
Bicarbonate: 34.9 mmol/L — ABNORMAL HIGH (ref 20.0–28.0)
Bicarbonate: 35.1 mmol/L — ABNORMAL HIGH (ref 20.0–28.0)
O2 SAT: 67 %
O2 Saturation: 100 %
PH ART: 7.429 (ref 7.350–7.450)
PO2 ART: 42 mmHg — AB (ref 83.0–108.0)
PO2 ART: 213 mmHg — AB (ref 83.0–108.0)
Patient temperature: 98.6
Patient temperature: 98.6
TCO2: 37 mmol/L — ABNORMAL HIGH (ref 22–32)
TCO2: 37 mmol/L — ABNORMAL HIGH (ref 22–32)
pCO2 arterial: 79.7 mmHg (ref 32.0–48.0)
pCO2 arterial: 53 mmHg — ABNORMAL HIGH (ref 32.0–48.0)
pH, Arterial: 7.249 — ABNORMAL LOW (ref 7.350–7.450)

## 2018-06-29 LAB — BASIC METABOLIC PANEL
ANION GAP: 10 (ref 5–15)
BUN: 18 mg/dL (ref 8–23)
CALCIUM: 7.3 mg/dL — AB (ref 8.9–10.3)
CO2: 30 mmol/L (ref 22–32)
Chloride: 87 mmol/L — ABNORMAL LOW (ref 98–111)
Creatinine, Ser: 0.47 mg/dL — ABNORMAL LOW (ref 0.61–1.24)
Glucose, Bld: 182 mg/dL — ABNORMAL HIGH (ref 70–99)
Potassium: 3.7 mmol/L (ref 3.5–5.1)
SODIUM: 127 mmol/L — AB (ref 135–145)

## 2018-06-29 LAB — BLOOD GAS, ARTERIAL
ACID-BASE EXCESS: 10 mmol/L — AB (ref 0.0–2.0)
BICARBONATE: 33.8 mmol/L — AB (ref 20.0–28.0)
Drawn by: 1714
O2 Content: 4 L/min
O2 SAT: 95.6 %
PCO2 ART: 44.2 mmHg (ref 32.0–48.0)
PO2 ART: 75.4 mmHg — AB (ref 83.0–108.0)
Patient temperature: 99.5
pH, Arterial: 7.498 — ABNORMAL HIGH (ref 7.350–7.450)

## 2018-06-29 LAB — BODY FLUID CELL COUNT WITH DIFFERENTIAL
EOS FL: NONE SEEN %
LYMPHS FL: 25 %
Monocyte-Macrophage-Serous Fluid: 9 % — ABNORMAL LOW (ref 50–90)
NEUTROPHIL FLUID: 66 % — AB (ref 0–25)
WBC FLUID: 460 uL (ref 0–1000)

## 2018-06-29 LAB — PROCALCITONIN: Procalcitonin: 0.52 ng/mL

## 2018-06-29 LAB — HEPATIC FUNCTION PANEL
ALT: 29 U/L (ref 0–44)
AST: 27 U/L (ref 15–41)
Albumin: 2 g/dL — ABNORMAL LOW (ref 3.5–5.0)
Alkaline Phosphatase: 93 U/L (ref 38–126)
BILIRUBIN DIRECT: 0.1 mg/dL (ref 0.0–0.2)
BILIRUBIN INDIRECT: 0.3 mg/dL (ref 0.3–0.9)
Total Bilirubin: 0.4 mg/dL (ref 0.3–1.2)
Total Protein: 5.9 g/dL — ABNORMAL LOW (ref 6.5–8.1)

## 2018-06-29 LAB — MRSA PCR SCREENING: MRSA BY PCR: NEGATIVE

## 2018-06-29 LAB — GRAM STAIN

## 2018-06-29 LAB — LACTIC ACID, PLASMA
LACTIC ACID, VENOUS: 1.3 mmol/L (ref 0.5–1.9)
Lactic Acid, Venous: 2.7 mmol/L (ref 0.5–1.9)

## 2018-06-29 LAB — PROTEIN, PLEURAL OR PERITONEAL FLUID: Total protein, fluid: <3 g/dL

## 2018-06-29 LAB — LIPASE, BLOOD: LIPASE: 18 U/L (ref 11–51)

## 2018-06-29 LAB — TROPONIN I
Troponin I: 0.06 ng/mL (ref <0.03)
Troponin I: 0.04 ng/mL (ref <0.03)

## 2018-06-29 LAB — FERRITIN: FERRITIN: 611 ng/mL — AB (ref 24–336)

## 2018-06-29 LAB — GLUCOSE, PLEURAL OR PERITONEAL FLUID: GLUCOSE FL: 173 mg/dL

## 2018-06-29 LAB — T4, FREE: Free T4: 0.97 ng/dL (ref 0.82–1.77)

## 2018-06-29 LAB — IRON AND TIBC
Iron: 88 ug/dL (ref 45–182)
SATURATION RATIOS: 87 % — AB (ref 17.9–39.5)
TIBC: 101 ug/dL — AB (ref 250–450)
UIBC: 13 ug/dL

## 2018-06-29 LAB — FOLATE: Folate: 18.2 ng/mL (ref >5.9)

## 2018-06-29 LAB — TSH
TSH: 21.061 u[IU]/mL — ABNORMAL HIGH (ref 0.350–4.500)
TSH: 17.789 u[IU]/mL — ABNORMAL HIGH (ref 0.350–4.500)

## 2018-06-29 LAB — I-STAT CG4 LACTIC ACID, ED: Lactic Acid, Venous: 2.34 mmol/L (ref 0.5–1.9)

## 2018-06-29 LAB — VITAMIN B12: Vitamin B-12: 929 pg/mL — ABNORMAL HIGH (ref 180–914)

## 2018-06-29 LAB — LACTATE DEHYDROGENASE, PLEURAL OR PERITONEAL FLUID: LD, Fluid: 78 U/L — ABNORMAL HIGH (ref 3–23)

## 2018-06-29 MED ORDER — FOLIC ACID 1 MG PO TABS
1.0000 mg | ORAL_TABLET | Freq: Every day | ORAL | Status: DC
  Administered 2018-06-29 – 2018-07-05 (×7): 1 mg via ORAL
  Filled 2018-06-29 (×7): qty 1

## 2018-06-29 MED ORDER — MEGESTROL ACETATE 400 MG/10ML PO SUSP
800.0000 mg | Freq: Every day | ORAL | Status: DC
  Administered 2018-06-29 – 2018-07-05 (×7): 800 mg
  Filled 2018-06-29 (×7): qty 20

## 2018-06-29 MED ORDER — ACETAMINOPHEN 650 MG RE SUPP: 650.0000 mg | Freq: Four times a day (QID) | RECTAL | Status: DC | PRN

## 2018-06-29 MED ORDER — LEVOTHYROXINE SODIUM 100 MCG IV SOLR
50.0000 ug | Freq: Every day | INTRAVENOUS | Status: DC
  Administered 2018-06-29: 50 ug via INTRAVENOUS
  Filled 2018-06-29: qty 5

## 2018-06-29 MED ORDER — LEVOTHYROXINE SODIUM 100 MCG PO TABS: 100.0000 ug | ORAL_TABLET | Freq: Every day | ORAL | Status: DC

## 2018-06-29 MED ORDER — LACTATED RINGERS IV SOLN
INTRAVENOUS | Status: AC
  Administered 2018-06-29: 09:00:00 via INTRAVENOUS

## 2018-06-29 MED ORDER — ENOXAPARIN SODIUM 40 MG/0.4ML ~~LOC~~ SOLN: 40.0000 mg | SUBCUTANEOUS | Status: DC

## 2018-06-29 MED ORDER — OSMOLITE 1.5 CAL PO LIQD
237.0000 mL | Freq: Three times a day (TID) | ORAL | Status: DC
  Administered 2018-06-29 – 2018-07-05 (×26): 237 mL
  Filled 2018-06-29 (×4): qty 237
  Filled 2018-06-29: qty 1000
  Filled 2018-06-29 (×7): qty 237
  Filled 2018-06-29 (×3): qty 1000
  Filled 2018-06-29: qty 237
  Filled 2018-06-29: qty 1000
  Filled 2018-06-29: qty 237
  Filled 2018-06-29: qty 1000
  Filled 2018-06-29 (×2): qty 237
  Filled 2018-06-29 (×2): qty 1000
  Filled 2018-06-29: qty 237
  Filled 2018-06-29 (×2): qty 1000
  Filled 2018-06-29 (×2): qty 237
  Filled 2018-06-29: qty 1000
  Filled 2018-06-29: qty 237
  Filled 2018-06-29: qty 1000

## 2018-06-29 MED ORDER — IOPAMIDOL (ISOVUE-300) INJECTION 61%
INTRAVENOUS | Status: AC
  Administered 2018-06-29: 40 mL via GASTROSTOMY
  Filled 2018-06-29: qty 50

## 2018-06-29 MED ORDER — PANTOPRAZOLE SODIUM 40 MG PO PACK
20.0000 mg | PACK | Freq: Two times a day (BID) | ORAL | Status: DC
  Administered 2018-06-29 – 2018-07-05 (×13): 20 mg
  Filled 2018-06-29 (×13): qty 20

## 2018-06-29 MED ORDER — VANCOMYCIN HCL 500 MG IV SOLR: 500.0000 mg | INTRAVENOUS | Status: DC

## 2018-06-29 MED ORDER — FREE WATER
250.0000 mL | Freq: Three times a day (TID) | Status: DC
  Administered 2018-06-29 – 2018-07-05 (×19): 250 mL

## 2018-06-29 MED ORDER — SODIUM CHLORIDE 0.9 % IV SOLN: 2.0000 g | Freq: Once | INTRAVENOUS | Status: DC

## 2018-06-29 MED ORDER — FENTANYL CITRATE (PF) 100 MCG/2ML IJ SOLN
50.0000 ug | Freq: Once | INTRAMUSCULAR | Status: DC
  Filled 2018-06-29: qty 2

## 2018-06-29 MED ORDER — PANCRELIPASE (LIP-PROT-AMYL) 12000-38000 UNITS PO CPEP
12000.0000 [IU] | ORAL_CAPSULE | Freq: Three times a day (TID) | ORAL | Status: DC
  Administered 2018-06-29 – 2018-06-30 (×5): 12000 [IU] via ORAL
  Filled 2018-06-29 (×6): qty 1

## 2018-06-29 MED ORDER — ADULT MULTIVITAMIN W/MINERALS CH: 1.0000 | ORAL_TABLET | Freq: Every day | ORAL | Status: DC

## 2018-06-29 MED ORDER — BUDESONIDE 0.25 MG/2ML IN SUSP: 0.2500 mg | Freq: Two times a day (BID) | RESPIRATORY_TRACT | Status: DC

## 2018-06-29 MED ORDER — ALBUTEROL SULFATE (2.5 MG/3ML) 0.083% IN NEBU
2.5000 mg | INHALATION_SOLUTION | RESPIRATORY_TRACT | Status: DC | PRN
  Administered 2018-07-01 (×2): 2.5 mg via RESPIRATORY_TRACT
  Filled 2018-06-29 (×2): qty 3

## 2018-06-29 MED ORDER — HYDRALAZINE HCL 20 MG/ML IJ SOLN: 5.0000 mg | INTRAMUSCULAR | Status: DC | PRN

## 2018-06-29 MED ORDER — LACTATED RINGERS IV BOLUS
1000.0000 mL | Freq: Once | INTRAVENOUS | Status: AC
  Administered 2018-06-29: 1000 mL via INTRAVENOUS

## 2018-06-29 MED ORDER — AMLODIPINE BESYLATE 5 MG PO TABS: 2.5000 mg | ORAL_TABLET | Freq: Every day | ORAL | Status: DC

## 2018-06-29 MED ORDER — SUCRALFATE 1 GM/10ML PO SUSP
1.0000 g | Freq: Three times a day (TID) | ORAL | Status: DC
  Administered 2018-06-29 – 2018-07-05 (×27): 1 g
  Filled 2018-06-29 (×29): qty 10

## 2018-06-29 MED ORDER — LIDOCAINE HCL (PF) 1 % IJ SOLN
INTRAMUSCULAR | Status: AC | PRN
  Administered 2018-06-29: 10 mL

## 2018-06-29 MED ORDER — ACETAMINOPHEN 160 MG/5ML PO SOLN
650.0000 mg | Freq: Four times a day (QID) | ORAL | Status: DC | PRN
  Administered 2018-06-29 – 2018-06-30 (×2): 650 mg via ORAL
  Filled 2018-06-29 (×2): qty 20.3

## 2018-06-29 MED ORDER — VANCOMYCIN HCL IN DEXTROSE 1-5 GM/200ML-% IV SOLN: 1000.0000 mg | Freq: Once | INTRAVENOUS | Status: DC

## 2018-06-29 MED ORDER — IPRATROPIUM-ALBUTEROL 0.5-2.5 (3) MG/3ML IN SOLN
3.0000 mL | RESPIRATORY_TRACT | Status: DC
  Administered 2018-06-29 – 2018-06-30 (×7): 3 mL via RESPIRATORY_TRACT
  Filled 2018-06-29 (×7): qty 3

## 2018-06-29 MED ORDER — LIDOCAINE HCL 1 % IJ SOLN
INTRAMUSCULAR | Status: AC
  Filled 2018-06-29: qty 20

## 2018-06-29 MED ORDER — OSMOLITE 1.5 CAL PO LIQD
237.0000 mL | Freq: Three times a day (TID) | ORAL | Status: DC
  Filled 2018-06-29: qty 237

## 2018-06-29 MED ORDER — ACETAMINOPHEN 325 MG PO TABS: 650.0000 mg | ORAL_TABLET | Freq: Four times a day (QID) | ORAL | Status: DC | PRN

## 2018-06-29 MED ORDER — ENOXAPARIN SODIUM 300 MG/3ML IJ SOLN
20.0000 mg | INTRAMUSCULAR | Status: DC
  Administered 2018-06-29 – 2018-06-30 (×2): 20 mg via SUBCUTANEOUS
  Filled 2018-06-29 (×3): qty 0.2

## 2018-06-29 MED ORDER — BUDESONIDE 0.5 MG/2ML IN SUSP
0.5000 mg | Freq: Two times a day (BID) | RESPIRATORY_TRACT | Status: DC
  Administered 2018-06-29 – 2018-07-05 (×13): 0.5 mg via RESPIRATORY_TRACT
  Filled 2018-06-29 (×14): qty 2

## 2018-06-29 MED ORDER — K PHOS MONO-SOD PHOS DI & MONO 155-852-130 MG PO TABS
500.0000 mg | ORAL_TABLET | Freq: Three times a day (TID) | ORAL | Status: DC
  Administered 2018-06-29 – 2018-07-05 (×24): 500 mg
  Filled 2018-06-29 (×29): qty 2

## 2018-06-29 MED ORDER — ONDANSETRON HCL 4 MG/2ML IJ SOLN
4.0000 mg | Freq: Four times a day (QID) | INTRAMUSCULAR | Status: DC | PRN
  Administered 2018-07-03: 4 mg via INTRAVENOUS
  Filled 2018-06-29: qty 2

## 2018-06-29 MED ORDER — VANCOMYCIN HCL IN DEXTROSE 750-5 MG/150ML-% IV SOLN
750.0000 mg | INTRAVENOUS | Status: DC
  Administered 2018-06-29: 750 mg via INTRAVENOUS
  Filled 2018-06-29: qty 150

## 2018-06-29 MED ORDER — MIRTAZAPINE 30 MG PO TABS: 30.0000 mg | ORAL_TABLET | Freq: Every day | ORAL | Status: DC

## 2018-06-29 MED ORDER — SODIUM CHLORIDE 0.9 % IV SOLN
1.0000 g | Freq: Two times a day (BID) | INTRAVENOUS | Status: DC
  Administered 2018-06-29 – 2018-07-01 (×6): 1 g via INTRAVENOUS
  Filled 2018-06-29 (×7): qty 1

## 2018-06-29 MED ORDER — ONDANSETRON HCL 4 MG PO TABS
4.0000 mg | ORAL_TABLET | Freq: Four times a day (QID) | ORAL | Status: DC | PRN
  Administered 2018-07-01: 4 mg via ORAL
  Filled 2018-06-29: qty 1

## 2018-06-29 NOTE — Care Management Note (Addendum)
Case Management Note  Patient Details  Name: Russell Williamson MRN: [id number] Date of Birth: Jul 06, 1956  Subjective/Objective:   Pt presented for Acute Respiratory Failure. Previous admission set up with [facility] Agency unable to see 2/2 readmitted back into Hospital. CM did attempt to visit with patient in regards to disposition needs- asleep at the time of visit. Pt may benefit from SNF-please place PT/OT consult.                   Action/Plan: Pt has PCP listed at [facility]- unable to locate PCP by the number listed in Epic. CM will continue to monitor for additional needs.   Expected Discharge Date:                  Expected Discharge Plan:  [place]  In-House Referral:     Discharge planning Services  CM Consult  Post Acute Care Choice:  [place], Resumption of Svcs/PTA Provider Choice offered to:  Patient  DME Arranged:    DME Agency:     HH Arranged:    [place] Agency:     Status of Service:  In process, will continue to follow  If discussed at Long Length of Stay Meetings, dates discussed:    Additional Comments: 1640 06-29-18 Jacqlyn Krauss, RN,BSN [phone] [facility] provided Case Manager with information for PCP: Dr. [facility] [phone]  [place] Fax [phone]. Bethena Roys, RN 06/29/2018, 4:07 PM

## 2018-06-29 NOTE — H&P (Signed)
History and Physical    Russell Williamson IRS:[id number] DOB: 05-19-1956 DOA: 06/28/2018  PCP: Harvie Junior, MD  Patient coming from: Home.  Chief Complaint: Respiratory distress and altered mental status.  HPI: Russell Williamson is a 62 y.o. male with history of tonsillar cancer status post radiation, chronic recurrent pancreatitis with cachexia and severe malnutrition, hypothyroidism, anemia, hypertension, COPD chronic pain who was just recently admitted for aspiration pneumonia during which patient also underwent PEG tube placement for dysphagia was brought to the ER after patient became confused unresponsive and respiratory distress.  EMS on arrival at home found patient to be hypoxic and was placed on nonrebreather was mildly hypotensive tachycardic and was brought to the ER.  Patient is confused and not much history was obtained from the patient.  Most of my history was obtained from the ER physician and patient's nurse and previous records.  ED Course: In the ER patient was febrile with chest x-ray showing bilateral pleural effusion and consolidation.  Patient's mental status and respiratory status improved after deep suctioning and placement of oxygen.  Patient was started on empiric antibiotics after blood cultures for pneumonia.  ABG was showing worsening hypoxia for which I placed patient on BiPAP and consult critical care.  Since patient also complained of abdominal pain and recent PEG tube placement I ordered acute abdomen with contrast which did not show any PEG tube leakage.  Review of Systems: As per HPI, rest all negative.   Past Medical History:  Diagnosis Date  . Anemia   . Arthritis   . Cervical adenopathy 04/26/2012  . Chronic back pain    lower  . Chronic pancreatitis ([place])   . Constipation   . COPD (chronic obstructive pulmonary disease) ([place])   . Daily headache 05/23/2012   "small ones"  . Deficiency anemia 04/15/2016  . Dyspnea    with exertion  . G tube  feedings (HCC)    hx of  feeding tube in stomach  . GERD (gastroesophageal reflux disease)   . History of blood transfusion ~ 2004   "from the pancreatitis"  . History of radiation therapy 02/02/2011-03/22/2011   head/neck,left tonsil  . Hx of radiation therapy 02/02/11 to 03/22/11   L tonsil  . Hypertension   . Insomnia   . 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 Pancytopenia ([place]) 02/28/2014  . Poor venous access 04/28/2016  . 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 Thrush 11/02/2013  . Tonsillar cancer ([place]) 12/15/2010   Left  . Urinary hesitancy     Past Surgical History:  Procedure Laterality Date  . BILE DUCT STENT PLACEMENT     hx of  . CHOLECYSTECTOMY  04/2005  . DIRECT LARYNGOSCOPY  05/23/2012   Procedure: DIRECT LARYNGOSCOPY;  Surgeon: Rozetta Nunnery, MD;  Location: [facility];  Service: ENT;  Laterality: N/A;  . DIRECT LARYNGOSCOPY N/A 03/23/2013   Procedure: DIRECT LARYNGOSCOPY;  Surgeon: Rozetta Nunnery, MD;  Location: [facility];  Service: ENT;  Laterality: N/A;  . ESOPHAGEAL DILATION N/A 03/23/2013   Procedure: ESOPHAGEAL DILATION;  Surgeon: Rozetta Nunnery, MD;  Location: [facility];  Service: ENT;  Laterality: N/A;  . ESOPHAGOGASTRODUODENOSCOPY (EGD) WITH PROPOFOL N/A 03/01/2017   Procedure: ESOPHAGOGASTRODUODENOSCOPY (EGD) WITH PROPOFOL;  Surgeon: Ladene Artist, MD;  Location: [facility];  Service: Endoscopy;  Laterality: N/A;  . FLEXIBLE SIGMOIDOSCOPY N/A 03/01/2017   Procedure: FLEXIBLE SIGMOIDOSCOPY;  Surgeon: Ladene Artist, MD;  Location: [facility];  Service: Endoscopy;  Laterality: N/A;  . IR GASTROSTOMY TUBE MOD SED  06/19/2018  . IR GENERIC HISTORICAL  04/29/2016   IR US GUIDE VASC ACCESS RIGHT 04/29/2016 [facility]  . IR GENERIC HISTORICAL  04/29/2016   IR FLUORO GUIDE CV LINE RIGHT 04/29/2016 [facility]  . IR GENERIC HISTORICAL  05/12/2016   IR US GUIDE VASC  ACCESS LEFT 05/12/2016 [facility]  . IR GENERIC HISTORICAL  05/12/2016   IR FLUORO GUIDE CV LINE LEFT 05/12/2016 [facility]  . IR GENERIC HISTORICAL  06/28/2016   IR US GUIDE VASC ACCESS RIGHT 06/28/2016 Sandi Mariscal, MD [facility]  . IR GENERIC HISTORICAL  06/28/2016   IR FLUORO GUIDE PORT INSERTION RIGHT 06/28/2016 Sandi Mariscal, MD [facility]  . IR REMOVAL TUN ACCESS W/ PORT W/O FL MOD SED  02/10/2017  . MASS BIOPSY  05/23/2012   Procedure: NECK MASS BIOPSY;  Surgeon: Rozetta Nunnery, MD;  Location: [facility];  Service: ENT;  Laterality: N/A;  . RADICAL NECK DISSECTION  05/23/2012   w/mass excision  . RADICAL NECK DISSECTION  05/23/2012   Procedure: RADICAL NECK DISSECTION;  Surgeon: Rozetta Nunnery, MD;  Location: [facility];  Service: ENT;  Laterality: Left;  . TIBIA FRACTURE SURGERY  1990's   left     reports that he has been smoking cigarettes. He has a 10.00 pack-year smoking history. He has never used smokeless tobacco. He reports that he does not drink alcohol or use drugs.  No Known Allergies  Family History  Problem Relation Age of Onset  . COPD Mother   . Colon cancer Neg Hx     Prior to Admission medications   Medication Sig Start Date End Date Taking? Authorizing Provider  albuterol (PROAIR HFA) 108 (90 Base) MCG/ACT inhaler Inhale 2 puffs into the lungs every 4 (four) hours as needed for wheezing or shortness of breath.    [provider]  Amino Acids-Protein Hydrolys (FEEDING SUPPLEMENT, PRO-STAT SUGAR FREE 64,) LIQD Place 30 mLs into feeding tube daily. 06/27/18   Oretha Milch D, MD  amLODipine (NORVASC) 2.5 MG tablet Take 2.5 mg by mouth daily. 12/24/16   [provider]  collagenase (SANTYL) ointment Apply topically daily. 06/27/18   Desiree Hane, MD  feeding supplement, ENSURE ENLIVE, (ENSURE ENLIVE) LIQD Take 237 mLs by mouth 3 (three) times daily between meals. Patient taking differently: Take 474 mLs by mouth 3 (three) times daily between  meals. 2 cans 02/10/18   Elodia Florence., MD  folic acid (FOLVITE) 1 MG tablet Take 1 mg by mouth daily.  05/14/15   [provider]  guaiFENesin-dextromethorphan (ROBITUSSIN DM) 100-10 MG/5ML syrup Take 15 mLs by mouth every 6 (six) hours as needed for cough. 06/26/18   Desiree Hane, MD  levothyroxine (SYNTHROID, LEVOTHROID) 100 MCG tablet Place 1 tablet (100 mcg total) into feeding tube daily before breakfast. 06/27/18   Oretha Milch D, MD  lipase/protease/amylase (CREON) 12000 units CPEP capsule Take 1 capsule (12,000 Units total) by mouth 4 (four) times daily. Ok to open capsule and mix with applesauce 06/26/18   Oretha Milch D, MD  magnesium oxide (MAG-OX) 400 (241.3 Mg) MG tablet Place 2 tablets (800 mg total) into feeding tube 2 (two) times daily. 06/26/18   Desiree Hane, MD  Maltodextrin-Xanthan Gum ([place]) POWD Add to liquids to make nectar thick consistency for dysphagia Patient not taking: Reported  on 04/23/2018 02/10/18   Elodia Florence., MD  megestrol (MEGACE) 400 MG/10ML suspension Place 20 mLs (800 mg total) into feeding tube daily. 06/27/18   Oretha Milch D, MD  mirtazapine (REMERON) 30 MG tablet Take 30 mg by mouth at bedtime.    [provider]  Multiple Vitamin (MULTIVITAMIN WITH MINERALS) TABS tablet Place 1 tablet into feeding tube daily. 06/27/18   Oretha Milch D, MD  nicotine (NICODERM CQ - DOSED IN MG/24 HOURS) 21 mg/24hr patch Place 1 patch (21 mg total) onto the skin daily. 06/27/18   Desiree Hane, MD  oxyCODONE (ROXICODONE) 15 MG immediate release tablet Take 1 tablet (15 mg total) by mouth every 8 (eight) hours as needed for pain. Patient taking differently: Take 15 mg by mouth every 4 (four) hours as needed for pain.  02/17/18   Domenic Polite, MD  OXYGEN Inhale 3 L into the lungs as needed (shortness of breath).    [provider]  pantoprazole sodium (PROTONIX) 40 mg/20 mL PACK Place 10 mLs (20 mg  total) into feeding tube 2 (two) times daily. 06/26/18   Desiree Hane, MD  phosphorus (K PHOS NEUTRAL) [phone] MG tablet Place 2 tablets (500 mg total) into feeding tube 4 (four) times daily -  with meals and at bedtime. 06/26/18   Desiree Hane, MD  promethazine (PHENERGAN) 25 MG tablet Take 1 tablet (25 mg total) by mouth every 6 (six) hours as needed for nausea or vomiting. 05/27/18   Law, Bea Graff, PA-C  sucralfate (CARAFATE) 1 GM/10ML suspension Place 10 mLs (1 g total) into feeding tube 4 (four) times daily -  with meals and at bedtime. 06/26/18   Desiree Hane, MD  traMADol (ULTRAM) 50 MG tablet Place 1 tablet (50 mg total) into feeding tube every 6 (six) hours as needed for up to 5 days for moderate pain. 06/26/18 07/01/18  Desiree Hane, MD  Water For Irrigation, Sterile (FREE WATER) SOLN Place 250 mLs into feeding tube every 8 (eight) hours. 06/26/18   Desiree Hane, MD    Physical Exam: Vitals:   06/29/18 0115 06/29/18 0130 06/29/18 0228 06/29/18 0230  BP: (!) 135/99 (!) 135/92 (!) 150/94 (!) 154/108  Pulse: (!) 110 (!) 110    Resp: 12 (!) 23 15 16   Temp:      TempSrc:      SpO2: 98% 91%        Constitutional: Moderately built and poorly nourished. Vitals:   06/29/18 0115 06/29/18 0130 06/29/18 0228 06/29/18 0230  BP: (!) 135/99 (!) 135/92 (!) 150/94 (!) 154/108  Pulse: (!) 110 (!) 110    Resp: 12 (!) 23 15 16   Temp:      TempSrc:      SpO2: 98% 91%     Eyes: Anicteric no pallor. ENMT: No discharge from the ears eyes nose or mouth. Neck: No mass or.  No neck rigidity. Respiratory: No rhonchi or crepitations. Cardiovascular: S1S2 no murmurs appreciated. Abdomen: Nontender PEG tube seen with mild discoloration around the PEG tube. Musculoskeletal: Mild edema both lower extremities. Skin: Chronic skin changes. Neurologic: Patient is lethargic arousable follows commands moves all extremities. Psychiatric: Lethargic.   Labs on Admission: I have  personally reviewed following labs and imaging studies  CBC: Recent Labs  Lab 06/22/18 0835 06/24/18 0725 06/24/18 0911 06/25/18 0623 06/28/18 2225 06/28/18 2255  WBC 6.6 5.3 5.4 6.4 11.5*  --   NEUTROABS  --   --   --   --  10.1*  --   HGB 8.2* 6.8* 7.0* 9.5* 7.8* 6.8*  HCT 24.6* 20.8* 21.4* 28.2* 24.2* 20.0*  MCV 103.8* 106.1* 105.9* 101.4* 107.1*  --   PLT 229 204 210 209 354  --    Basic Metabolic Panel: Recent Labs  Lab 06/22/18 0657  06/22/18 0835 06/23/18 0857 06/24/18 0725 06/26/18 0939 06/28/18 2225 06/28/18 2255  NA  --    < > 132* 135 138 135 130* 129*  K  --    < > 3.3* 2.5* 4.2 4.8 3.6 3.7  CL  --    < > 88* 90* 98 95* 92* 86*  CO2  --   --  29 33* 34* 29 29  --   GLUCOSE  --    < > 116* 70 148* 118* 160* 163*  BUN  --    < > 8 7* 5* 7* 15 20  CREATININE  --    < > 0.44* 0.39* 0.36* 0.35* 0.50* 0.50*  CALCIUM  --   --  7.8* 7.9* 7.5* 7.5* 6.8*  --   MG 1.5*  --   --  1.6* 1.8  --   --   --   PHOS 3.5  --   --   --   --   --   --   --    < > = values in this interval not displayed.   GFR: Estimated Creatinine Clearance: 50 mL/min (A) (by C-G formula based on SCr of 0.5 mg/dL (L)). Liver Function Tests: Recent Labs  Lab 06/28/18 2225  AST 28  ALT 26  ALKPHOS 86  BILITOT 0.7  PROT 5.5*  ALBUMIN 1.8*   Recent Labs  Lab 06/23/18 0857  LIPASE 18   No results for input(s): AMMONIA in the last 168 hours. Coagulation Profile: Recent Labs  Lab 06/28/18 2225  INR 1.25   Cardiac Enzymes: Recent Labs  Lab 06/28/18 2225  TROPONINI 0.05*   BNP (last 3 results) No results for input(s): PROBNP in the last 8760 hours. HbA1C: No results for input(s): HGBA1C in the last 72 hours. CBG: Recent Labs  Lab 06/26/18 0026 06/26/18 0439 06/26/18 0732 06/26/18 1110 06/26/18 1623  GLUCAP 89 149* 91 127* 133*   Lipid Profile: No results for input(s): CHOL, HDL, LDLCALC, TRIG, CHOLHDL, LDLDIRECT in the last 72 hours. Thyroid Function Tests: No  results for input(s): TSH, T4TOTAL, FREET4, T3FREE, THYROIDAB in the last 72 hours. Anemia Panel: No results for input(s): VITAMINB12, FOLATE, FERRITIN, TIBC, IRON, RETICCTPCT in the last 72 hours. Urine analysis:    Component Value Date/Time   COLORURINE AMBER (A) 06/28/2018 2243   APPEARANCEUR HAZY (A) 06/28/2018 2243   LABSPEC 1.020 06/28/2018 2243   PHURINE 5.0 06/28/2018 2243   GLUCOSEU NEGATIVE 06/28/2018 2243   HGBUR NEGATIVE 06/28/2018 2243   BILIRUBINUR NEGATIVE 06/28/2018 2243   KETONESUR NEGATIVE 06/28/2018 2243   PROTEINUR 30 (A) 06/28/2018 2243   UROBILINOGEN 0.2 08/31/2014 2350   NITRITE NEGATIVE 06/28/2018 2243   LEUKOCYTESUR NEGATIVE 06/28/2018 2243   Sepsis Labs: @LABRCNTIP (procalcitonin:4,lacticidven:4) )No results found for this or any previous visit (from the past 240 hour(s)).   Radiological Exams on Admission: Dg Abdomen Peg Tube Location  Result Date: 06/29/2018 CLINICAL DATA:  Initial evaluation for PEG tube placement EXAM: ABDOMEN - 1 VIEW COMPARISON:  Prior radiograph from 06/22/2018 FINDINGS: Contrast material has been instilled via a percutaneous gastrostomy tube. Contrast seen filling the gastric body and fundus. No extraluminal contrast to suggest leak or  malpositioning. Nonobstructive bowel gas pattern. Large amount of retained stool within the colon, suggesting constipation. Cholecystectomy clips noted. Right greater than left bilateral pleural effusions with associated bibasilar opacities partially visualized at the lung bases. Degenerative changes noted within the lower lumbar spine. IMPRESSION: 1. Percutaneous gastrostomy tube in place without complication. No extraluminal contrast to suggest malpositioning or leak. 2. Large volume retained stool within the colon, suggesting constipation. 3. Bilateral right greater than left pleural effusions with associated bibasilar atelectasis and/or consolidation. Electronically Signed   By: Jeannine Boga M.D.    On: 06/29/2018 02:29   Dg Chest Port 1 View  Result Date: 06/28/2018 CLINICAL DATA:  Unresponsive and hypoxic. EXAM: PORTABLE CHEST 1 VIEW COMPARISON:  06/26/2018 FINDINGS: Mild cardiac enlargement. Streaky parenchymal opacities at the left lung base with air bronchograms may reflect areas of atelectasis and/or pneumonia. Moderate right and small left pleural effusions are redemonstrated slightly increased on the left. Emphysematous hyperinflation of the lungs, upper lobe predominant. No acute osseous abnormality. IMPRESSION: Moderate right and small left pleural effusions, slightly increased on the left and stable on the right since prior. Subsegmental atelectasis and/or pneumonia at the left lung base. Emphysematous hyperinflation of the lungs bilaterally. Electronically Signed   By: Ashley Royalty M.D.   On: 06/28/2018 22:44    EKG: Independently reviewed.  Sinus tachycardia.  Assessment/Plan Principal Problem:   Acute respiratory failure with hypoxia (HCC) Active Problems:   History of cancer tonsil   Hx of radiation therapy   Hypothyroidism (acquired)   S/P gastrostomy (HCC)   Relapsing chronic pancreatitis (HCC)   Anemia in chronic illness   Megaloblastic anemia   Protein-calorie malnutrition, severe   COPD with chronic bronchitis (HCC)   Acute metabolic encephalopathy   Bilateral pleural effusion   HCAP (healthcare-associated pneumonia)    1. Acute respiratory failure with hypoxia and hypercarbia likely from H CAP pneumonia/aspiration pneumonia and also bilateral pleural effusion.  I have consulted critical care.  Patient will be continued on BiPAP for now.  Continue antibiotics follow cultures follow troponin. 2. Acute metabolic encephalopathy likely from hypoxia and hypercarbia which is improving at this time. 3. Hypothyroidism on Synthroid which will be dosed IV. 4. Megaloblastic anemia has received transfusion during last admission. 5. History of recurrent pancreatitis with  cachexia and malnutrition. 6. Recent PEG tube placement for feedings. 7. COPD we will keep patient on nebulizers. 8. History of tonsillar cancer status post radiation.   DVT prophylaxis: Lovenox. Code Status: Full code. Family Communication: No family at the bedside. Disposition Plan: To be determined. Consults called: Pulmonary critical care. Admission status: Inpatient.   Rise Patience MD [facility] Pager [phone].  If 7PM-7AM, please contact night-coverage [email or web address] Password [facility]  06/29/2018, 2:47 AM

## 2018-06-29 NOTE — Consult Note (Signed)
NAME:  Russell Williamson, MRN:  [id number], DOB:  06-26-1956, LOS: 0 ADMISSION DATE:  06/28/2018, CONSULTATION DATE:  06/29/18 REFERRING MD:  Hal Hope CHIEF COMPLAINT:  AMS   Brief History   Russell Williamson is a 62 y.o. male who had recent admission for sepsis due to aspiration.  Discharged home 9/23 then presented again 9/25 with AMS.  Found to have hypoxemic and hypercapnic respiratory failure along with HCAP. Admitted by [facility] who had concerns he might require intubation; therefore, PCCM consulted.  Significant Hospital Events   9/06 - 9/23 > admit. 9/25 > readmit.  Consults: date of consult/date signed off & final recs:  None.  Procedures (surgical and bedside):  9/25 > started on BiPAP.  Significant Diagnostic Tests:  CXR 9/25 > b/l effusions R > L, bibasilar opacities.  Micro Data: Blood 9/25 >  Sputum 9/25 >   Antimicrobials:  Vanc 9/25 >  Cefepime 9/25 >    Subjective:  On BiPAP.  Opens eyes to voice, follows basic commands.  Objective   Blood pressure (!) 154/108, pulse 94, temperature (!) 100.4 F (38 C), temperature source Rectal, resp. rate 13, SpO2 91 %.        Intake/Output Summary (Last 24 hours) at 06/29/2018 0513 Last data filed at 06/29/2018 0007 Gross per 24 hour  Intake 193.43 ml  Output 100 ml  Net 93.43 ml   There were no vitals filed for this visit.  Examination: General: Adult male, cachectic, resting in bed, in NAD. Neuro: Somnolent but easily arouses to voice, follows basic commands. HEENT: [place]/AT. Sclerae anicteric, BiPAP in place. Cardiovascular: RRR, no M/R/G.  Lungs: Respirations even and unlabored.  CTA bilaterally, No W/R/R. Abdomen: BS x 4, soft, NT/ND.  Musculoskeletal: No gross deformities, no edema.  Skin: Intact, warm, no rashes.  Assessment & Plan:   Acute on chronic hypoxemic and hypercapnic respiratory failure - first ABG 7.24 / 79 / 42 and was initially placed on BiPAP with settings 14 / 8 and rate 12; however, was only  getting Vt in 200 range.  Settings increased to 20 / 10 with rate 18 and Vt increased to 500 and 600 range. Plan: Continue BiPAP at 20 / 10 with rate 18. Repeat ABG at 0630. No role intubation at this time.  HCAP. Plan: Continue empiric abx. Follow cultures.  B/l pleural effusions R > L with atelectasis. Plan: Day team to consider assessing chest under US guidance for consideration diagnostic and therapeutic thoracentesis. Incentive spirometry once off BiPAP.  Rest per primary team.   Disposition / Summary of Today's Plan 06/29/18   SDU under [facility] service. F/u 0630 ABG.  Day team to consider assessing chest [place] for consideration thoracentesis.    Diet: NPO for now. Pain/Anxiety/Delirium protocol (if indicated): N/A. VAP protocol (if indicated): N/A. DVT prophylaxis: SCD's / lovenox. GI prophylaxis: N/A. Hyperglycemia protocol: N/A. Mobility: Bedrest. Code Status: Full. Family Communication: None available.  Labs   CBC: Recent Labs  Lab 06/24/18 0725 06/24/18 0911 06/25/18 7782 06/28/18 2225 06/28/18 2255 06/29/18 0314  WBC 5.3 5.4 6.4 11.5*  --  13.4*  NEUTROABS  --   --   --  10.1*  --  11.5*  HGB 6.8* 7.0* 9.5* 7.8* 6.8* 7.4*  HCT 20.8* 21.4* 28.2* 24.2* 20.0* 23.4*  MCV 106.1* 105.9* 101.4* 107.1*  --  106.8*  PLT 204 210 209 354  --  423   Basic Metabolic Panel: Recent Labs  Lab 06/22/18 0657  06/23/18 0857 06/24/18 0725 06/26/18  9449 06/28/18 2225 06/28/18 2255 06/29/18 0314  NA  --    < > 135 138 135 130* 129* 127*  K  --    < > 2.5* 4.2 4.8 3.6 3.7 3.7  CL  --    < > 90* 98 95* 92* 86* 87*  CO2  --    < > 33* 34* 29 29  --  30  GLUCOSE  --    < > 70 148* 118* 160* 163* 182*  BUN  --    < > 7* 5* 7* 15 20 18   CREATININE  --    < > 0.39* 0.36* 0.35* 0.50* 0.50* 0.47*  CALCIUM  --    < > 7.9* 7.5* 7.5* 6.8*  --  7.3*  MG 1.5*  --  1.6* 1.8  --   --   --   --   PHOS 3.5  --   --   --   --   --   --   --    < > = values in this interval not  displayed.   GFR: Estimated Creatinine Clearance: 50 mL/min (A) (by C-G formula based on SCr of 0.47 mg/dL (L)). Recent Labs  Lab 06/24/18 0911 06/25/18 0623 06/28/18 2225 06/28/18 2256 06/29/18 0206 06/29/18 0314 06/29/18 0351  PROCALCITON  --   --   --   --   --  0.52  --   WBC 5.4 6.4 11.5*  --   --  13.4*  --   LATICACIDVEN  --   --   --  2.44* 2.34*  --  1.3   Liver Function Tests: Recent Labs  Lab 06/28/18 2225 06/29/18 0314  AST 28 27  ALT 26 29  ALKPHOS 86 93  BILITOT 0.7 0.4  PROT 5.5* 5.9*  ALBUMIN 1.8* 2.0*   Recent Labs  Lab 06/23/18 0857  LIPASE 18   No results for input(s): AMMONIA in the last 168 hours. ABG    Component Value Date/Time   PHART 7.249 (L) 06/29/2018 0340   PCO2ART 79.7 (HH) 06/29/2018 0340   PO2ART 42.0 (L) 06/29/2018 0340   HCO3 34.9 (H) 06/29/2018 0340   TCO2 37 (H) 06/29/2018 0340   ACIDBASEDEF 0.6 02/14/2018 0610   O2SAT 67.0 06/29/2018 0340    Coagulation Profile: Recent Labs  Lab 06/28/18 2225  INR 1.25   Cardiac Enzymes: Recent Labs  Lab 06/28/18 2225 06/29/18 0314  TROPONINI 0.05* 0.06*   HbA1C: Hgb A1c MFr Bld  Date/Time Value Ref Range Status  02/11/2018 12:44 AM 6.2 (H) 4.8 - 5.6 % Final    Comment:    (NOTE) Pre diabetes:          5.7%-6.4% Diabetes:              >6.4% Glycemic control for   <7.0% adults with diabetes    CBG: Recent Labs  Lab 06/26/18 0026 06/26/18 0439 06/26/18 0732 06/26/18 1110 06/26/18 1623  GLUCAP 89 149* 91 127* 133*    Admitting History of Present Illness.    Russell Williamson is a 62 y.o. male who has a PMH as outlined below.  He had recent admission 06/09/18 through 06/26/18 for sepsis due to aspiration PNA.  He was discharged home; however, he presented back to [facility] ED on 9/25 with AMS.  Family felt that he was minimally responsive and had increased WOB; therefore, they called EMS. Of note, during that admission, he had palliative care consult 9/16 and decision was made  to continue with full scope of care / full code status.  In ED, CXR showed left basilar infiltrate and he was found to have hypoxemic and hypercapnic respiratory failure for which he was started on BiPAP.  Also found to have multiple metabolic derangements that are being managed by [facility].  He was admitted by the hospitalist who had concerns that he might require intubation; therefore, PCCM was asked to see in consultation.  At the time of my evaluation, pt is somnolent but easily arouses to voice and is able to follow basic commands.  He was on BiPAP on settings 14 / 8 with rate 12.  Vt on these setting was only in 200 range. I adjusted settings to 20 / 10 with rate 18 and Vt improved to 500 - 600 range.  A repeat ABG has been ordered for 0630.   Review of Systems:   Unable to obtain as pt is on BiPAP.  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   Surgical History    Past Surgical History:  Procedure Laterality Date  . BILE DUCT STENT PLACEMENT     hx of  . CHOLECYSTECTOMY  04/2005  . DIRECT LARYNGOSCOPY  05/23/2012   Procedure: DIRECT LARYNGOSCOPY;  Surgeon: Rozetta Nunnery, MD;  Location: [place];  Service: ENT;  Laterality: N/A;  . DIRECT LARYNGOSCOPY N/A 03/23/2013   Procedure: DIRECT LARYNGOSCOPY;  Surgeon:  Rozetta Nunnery, MD;  Location: [place];  Service: ENT;  Laterality: N/A;  . ESOPHAGEAL DILATION N/A 03/23/2013   Procedure: ESOPHAGEAL DILATION;  Surgeon: Rozetta Nunnery, MD;  Location: [place];  Service: ENT;  Laterality: N/A;  . ESOPHAGOGASTRODUODENOSCOPY (EGD) WITH PROPOFOL N/A 03/01/2017   Procedure: ESOPHAGOGASTRODUODENOSCOPY (EGD) WITH PROPOFOL;  Surgeon: Ladene Artist, MD;  Location: [facility];  Service: Endoscopy;  Laterality: N/A;  . FLEXIBLE SIGMOIDOSCOPY N/A 03/01/2017   Procedure: FLEXIBLE SIGMOIDOSCOPY;  Surgeon: Ladene Artist, MD;  Location: [facility];  Service: Endoscopy;  Laterality: N/A;  . IR GASTROSTOMY TUBE MOD SED  06/19/2018  . IR GENERIC HISTORICAL  04/29/2016   IR US GUIDE VASC ACCESS RIGHT 04/29/2016 [facility]  . IR GENERIC HISTORICAL  04/29/2016   IR FLUORO GUIDE CV LINE RIGHT 04/29/2016 [facility]  . IR GENERIC HISTORICAL  05/12/2016   IR US GUIDE VASC ACCESS LEFT 05/12/2016 [facility]  . IR GENERIC HISTORICAL  05/12/2016   IR FLUORO GUIDE CV LINE LEFT 05/12/2016 [facility]  . IR GENERIC HISTORICAL  06/28/2016   IR US GUIDE VASC ACCESS RIGHT 06/28/2016 Sandi Mariscal, MD [facility]  . IR GENERIC HISTORICAL  06/28/2016   IR FLUORO GUIDE PORT INSERTION RIGHT 06/28/2016 Sandi Mariscal, MD [facility]  . IR REMOVAL TUN ACCESS W/ PORT W/O FL MOD SED  02/10/2017  . MASS BIOPSY  05/23/2012   Procedure: NECK MASS BIOPSY;  Surgeon: Rozetta Nunnery, MD;  Location: [place];  Service: ENT;  Laterality: N/A;  . RADICAL NECK DISSECTION  05/23/2012  w/mass excision  . RADICAL NECK DISSECTION  05/23/2012   Procedure: RADICAL NECK DISSECTION;  Surgeon: Rozetta Nunnery, MD;  Location: [place];  Service: ENT;  Laterality: Left;  . TIBIA FRACTURE SURGERY  1990's   left     Social History   Social History   Socioeconomic History  . Marital status: Single    Spouse name: Not on file  . Number of children: Not on file  .  Years of education: Not on file  . Highest education level: Not on file  Occupational History  . Not on file  Social Needs  . Financial resource strain: Not on file  . Food insecurity:    Worry: Not on file    Inability: Not on file  . Transportation needs:    Medical: Not on file    Non-medical: Not on file  Tobacco Use  . Smoking status: Current Every Day Smoker    Packs/day: 0.25    Years: 40.00    Pack years: 10.00    Types: Cigarettes  . Smokeless tobacco: Never Used  . Tobacco comment: hx 1/2 -1 PPD  Substance and Sexual Activity  . Alcohol use: No    Comment: 05/23/2012 hx alcohol abuse, quit ~ 2007  . Drug use: No    Types: Marijuana    Comment: 05/23/2012 "used marijuana in my teens"  . Sexual activity: Never  Lifestyle  . Physical activity:    Days per week: Not on file    Minutes per session: Not on file  . Stress: Not on file  Relationships  . Social connections:    Talks on phone: Not on file    Gets together: Not on file    Attends religious service: Not on file    Active member of club or organization: Not on file    Attends meetings of clubs or organizations: Not on file    Relationship status: Not on file  . Intimate partner violence:    Fear of current or ex partner: Not on file    Emotionally abused: Not on file    Physically abused: Not on file    Forced sexual activity: Not on file  Other Topics Concern  . Not on file  Social History Narrative  . Not on file  ,  reports that he has been smoking cigarettes. He has a 10.00 pack-year smoking history. He has never used smokeless tobacco. He reports that he does not drink alcohol or use drugs.   Family history   His family history includes COPD in his mother. There is no history of Colon cancer.   Allergies No Known Allergies  Home meds  Prior to Admission medications   Medication Sig Start Date End Date Taking? Authorizing Provider  albuterol (PROAIR HFA) 108 (90 Base) MCG/ACT inhaler Inhale 2  puffs into the lungs every 4 (four) hours as needed for wheezing or shortness of breath.    [provider]  Amino Acids-Protein Hydrolys (FEEDING SUPPLEMENT, PRO-STAT SUGAR FREE 64,) LIQD Place 30 mLs into feeding tube daily. 06/27/18   Oretha Milch D, MD  amLODipine (NORVASC) 2.5 MG tablet Take 2.5 mg by mouth daily. 12/24/16   [provider]  collagenase (SANTYL) ointment Apply topically daily. 06/27/18   Desiree Hane, MD  feeding supplement, ENSURE ENLIVE, (ENSURE ENLIVE) LIQD Take 237 mLs by mouth 3 (three) times daily between meals. Patient taking differently: Take 474 mLs by mouth 3 (three) times daily between meals. 2 cans  02/10/18   Elodia Florence., MD  folic acid (FOLVITE) 1 MG tablet Take 1 mg by mouth daily.  05/14/15   [provider]  guaiFENesin-dextromethorphan (ROBITUSSIN DM) 100-10 MG/5ML syrup Take 15 mLs by mouth every 6 (six) hours as needed for cough. 06/26/18   Desiree Hane, MD  levothyroxine (SYNTHROID, LEVOTHROID) 100 MCG tablet Place 1 tablet (100 mcg total) into feeding tube daily before breakfast. 06/27/18   Oretha Milch D, MD  lipase/protease/amylase (CREON) 12000 units CPEP capsule Take 1 capsule (12,000 Units total) by mouth 4 (four) times daily. Ok to open capsule and mix with applesauce 06/26/18   Oretha Milch D, MD  magnesium oxide (MAG-OX) 400 (241.3 Mg) MG tablet Place 2 tablets (800 mg total) into feeding tube 2 (two) times daily. 06/26/18   Desiree Hane, MD  Maltodextrin-Xanthan Gum ([place]) POWD Add to liquids to make nectar thick consistency for dysphagia Patient not taking: Reported on 04/23/2018 02/10/18   Elodia Florence., MD  megestrol (MEGACE) 400 MG/10ML suspension Place 20 mLs (800 mg total) into feeding tube daily. 06/27/18   Oretha Milch D, MD  mirtazapine (REMERON) 30 MG tablet Take 30 mg by mouth at bedtime.    [provider]  Multiple Vitamin (MULTIVITAMIN WITH MINERALS)  TABS tablet Place 1 tablet into feeding tube daily. 06/27/18   Oretha Milch D, MD  nicotine (NICODERM CQ - DOSED IN MG/24 HOURS) 21 mg/24hr patch Place 1 patch (21 mg total) onto the skin daily. 06/27/18   Desiree Hane, MD  oxyCODONE (ROXICODONE) 15 MG immediate release tablet Take 1 tablet (15 mg total) by mouth every 8 (eight) hours as needed for pain. Patient taking differently: Take 15 mg by mouth every 4 (four) hours as needed for pain.  02/17/18   Domenic Polite, MD  OXYGEN Inhale 3 L into the lungs as needed (shortness of breath).    [provider]  pantoprazole sodium (PROTONIX) 40 mg/20 mL PACK Place 10 mLs (20 mg total) into feeding tube 2 (two) times daily. 06/26/18   Desiree Hane, MD  phosphorus (K PHOS NEUTRAL) [phone] MG tablet Place 2 tablets (500 mg total) into feeding tube 4 (four) times daily -  with meals and at bedtime. 06/26/18   Desiree Hane, MD  promethazine (PHENERGAN) 25 MG tablet Take 1 tablet (25 mg total) by mouth every 6 (six) hours as needed for nausea or vomiting. 05/27/18   Law, Bea Graff, PA-C  sucralfate (CARAFATE) 1 GM/10ML suspension Place 10 mLs (1 g total) into feeding tube 4 (four) times daily -  with meals and at bedtime. 06/26/18   Desiree Hane, MD  traMADol (ULTRAM) 50 MG tablet Place 1 tablet (50 mg total) into feeding tube every 6 (six) hours as needed for up to 5 days for moderate pain. 06/26/18 07/01/18  Desiree Hane, MD  Water For Irrigation, Sterile (FREE WATER) SOLN Place 250 mLs into feeding tube every 8 (eight) hours. 06/26/18   Desiree Hane, MD    [facility]y Hora, [facility] Pager: [phone]  or [phone] 06/29/2018, 5:13 AM

## 2018-06-29 NOTE — Progress Notes (Signed)
@  approx. 5702 pt arrived from ED. While lethargic and unable to verbalize responses 2/2 Bi-Pap, pt following commands. VSS (see Flowsheet). Pt oriented to unit, callbell within reach, and bed alarm set 2/2 high fall risk. Report given to Day RN, Jaz.

## 2018-06-29 NOTE — Progress Notes (Addendum)
PROGRESS NOTE    Russell Williamson  RDE:[id number] DOB: [birth year] DOA: 06/28/2018 PCP: Harvie Junior, MD  Brief Narrative: Russell Williamson is Russell Williamson 62 y.o. male with history of tonsillar cancer status post radiation, chronic recurrent pancreatitis with cachexia and severe malnutrition, hypothyroidism, anemia, hypertension, COPD chronic pain who was just recently admitted for aspiration pneumonia during which patient also underwent PEG tube placement for dysphagia was brought to the ER after patient became confused unresponsive and respiratory distress.  EMS on arrival at home found patient to be hypoxic and was placed on nonrebreather was mildly hypotensive tachycardic and was brought to the ER.  Patient is confused and not much history was obtained from the patient.  Most of my history was obtained from the ER physician and patient's nurse and previous records.   Assessment & Plan:   Principal Problem:   Acute respiratory failure with hypoxia (HCC) Active Problems:   History of cancer tonsil   Hx of radiation therapy   Hypothyroidism (acquired)   S/P gastrostomy ([place])   Relapsing chronic pancreatitis (HCC)   Anemia in chronic illness   Megaloblastic anemia   Protein-calorie malnutrition, severe   COPD with chronic bronchitis (HCC)   Acute metabolic encephalopathy   Bilateral pleural effusion   HCAP (healthcare-associated pneumonia)   1. Acute respiratory failure with hypoxia and hypercarbia likely from HCAP  Possible Aspiration Pneumonia  Bilateral Pleural Effusions:  1. Vancomycin/cefepime 2. Follow blood cx, sputum cx, MRSA PCR, narrow abx as able 3. Pt currently on BIPAP, ABG improved at this time, appreciate PCCM assistance.  If continues to do well this morning, may trial off. 4. Bilateral R>L effusions, will plan on thoracentesis 5. Will repeat speech evaluation  2. Acute metabolic encephalopathy likely from hypoxia and hypercarbia - seems improved at this time, will  need to continue to follow  3. Hypothyroidism TSH 17.789, follow free T3/T4.  Just started on synthroid earlier this month.    4. Megaloblastic anemia has received transfusion during last admission.  Stable.  Transfuse for <7.  Follow B12, folate, iron panel.  5. History of recurrent pancreatitis with cachexia and malnutrition.  Continue creon.  He c/o pain c/w chronic pancreatitis, will follow lipase.  6. Recent PEG tube placement for feedings.  7. COPD we will keep patient on nebulizers.  No wheezing on my exam this morning, hypercarbia improved on bipap.  8. History of tonsillar cancer status post radiation.  9. Elevated troponin: mild and flat, c/w priors, no c/o chest pain.  EKG appears c/w priors.  Follow.  10. Hyponatremia:  Will give IVF as pt appears hypovolemic to euvolemic on exam.  Follow urine sodium/osms.  Of note, he's receiving free water per tube.  Addendum: Pt developed hypotension after thoracentesis, improved with IVF.  Mildly elevated lactate, repeat pending after IVF (will discuss with nursing need to repeat).  DVT prophylaxis: lovenox Code Status: full Family Communication: none at bedside, declined me calling anyone Disposition Plan: pending improvement   Consultants:   PCCM  Procedures:   none  Antimicrobials:  Anti-infectives (From admission, onward)   Start     Dose/Rate Route Frequency Ordered Stop   06/29/18 2200  vancomycin (VANCOCIN) 500 mg in sodium chloride 0.9 % 100 mL IVPB  Status:  Discontinued     500 mg 100 mL/hr over 60 Minutes Intravenous Every 24 hours 06/29/18 0357 06/29/18 0400   06/29/18 2200  vancomycin (VANCOCIN) IVPB 750 mg/150 ml premix     750 mg 150 mL/hr  over 60 Minutes Intravenous Every 24 hours 06/29/18 0400     06/29/18 1000  ceFEPIme (MAXIPIME) 1 g in sodium chloride 0.9 % 100 mL IVPB     1 g 200 mL/hr over 30 Minutes Intravenous Every 12 hours 06/29/18 0357     06/29/18 0300  vancomycin (VANCOCIN) IVPB 1000 mg/200  mL premix  Status:  Discontinued     1,000 mg 200 mL/hr over 60 Minutes Intravenous  Once 06/29/18 0248 06/29/18 0250   06/29/18 0300  ceFEPIme (MAXIPIME) 2 g in sodium chloride 0.9 % 100 mL IVPB  Status:  Discontinued     2 g 200 mL/hr over 30 Minutes Intravenous  Once 06/29/18 0248 06/29/18 0251   06/28/18 2245  vancomycin (VANCOCIN) IVPB 1000 mg/200 mL premix     1,000 mg 200 mL/hr over 60 Minutes Intravenous  Once 06/28/18 2236 06/29/18 0007   06/28/18 2245  ceFEPIme (MAXIPIME) 2 g in sodium chloride 0.9 % 100 mL IVPB     2 g 200 mL/hr over 30 Minutes Intravenous  Once 06/28/18 2236 06/28/18 2335      Subjective: Pt on bipap, difficult to communicate Wakes up to voice. C/o pain c/w chronic pancreatitis.  Objective: Vitals:   06/29/18 0552 06/29/18 0626 06/29/18 0700 06/29/18 0730  BP:   110/77 93/68  Pulse:    81  Resp:   14 (!) 9  Temp:   97.8 F (36.6 C)   TempSrc:   Axillary   SpO2: 97% 100% 97% 98%  Weight:   45.2 kg   Height:   5\' 5"  (1.651 m)     Intake/Output Summary (Last 24 hours) at 06/29/2018 0800 Last data filed at 06/29/2018 0007 Gross per 24 hour  Intake 193.43 ml  Output 100 ml  Net 93.43 ml   Filed Weights   06/29/18 0700  Weight: 45.2 kg    Examination:  General exam: Appears calm and comfortable  Respiratory system: Clear to auscultation. Respiratory effort normal. Cardiovascular system: S1 & S2 heard, RRR. No JVD, murmurs, rubs, gallops or clicks. No pedal edema. Gastrointestinal system: Abdomen is nondistended, soft and nontender. No organomegaly or masses felt. Normal bowel sounds heard. Central nervous system: Alert and oriented. No focal neurological deficits. Extremities: Symmetric 5 x 5 power. Skin: No rashes, lesions or ulcers Psychiatry: Judgement and insight appear normal. Mood & affect appropriate.     Data Reviewed: I have personally reviewed following labs and imaging studies  CBC: Recent Labs  Lab 06/24/18 0725  06/24/18 0911 06/25/18 0623 06/28/18 2225 06/28/18 2255 06/29/18 0314  WBC 5.3 5.4 6.4 11.5*  --  13.4*  NEUTROABS  --   --   --  10.1*  --  11.5*  HGB 6.8* 7.0* 9.5* 7.8* 6.8* 7.4*  HCT 20.8* 21.4* 28.2* 24.2* 20.0* 23.4*  MCV 106.1* 105.9* 101.4* 107.1*  --  106.8*  PLT 204 210 209 354  --  053   Basic Metabolic Panel: Recent Labs  Lab 06/23/18 0857 06/24/18 0725 06/26/18 0939 06/28/18 2225 06/28/18 2255 06/29/18 0314  NA 135 138 135 130* 129* 127*  K 2.5* 4.2 4.8 3.6 3.7 3.7  CL 90* 98 95* 92* 86* 87*  CO2 33* 34* 29 29  --  30  GLUCOSE 70 148* 118* 160* 163* 182*  BUN 7* 5* 7* 15 20 18   CREATININE 0.39* 0.36* 0.35* 0.50* 0.50* 0.47*  CALCIUM 7.9* 7.5* 7.5* 6.8*  --  7.3*  MG 1.6* 1.8  --   --   --   --  GFR: Estimated Creatinine Clearance: 61.2 mL/min (Ricky Doan) (by C-G formula based on SCr of 0.47 mg/dL (L)). Liver Function Tests: Recent Labs  Lab 06/28/18 2225 06/29/18 0314  AST 28 27  ALT 26 29  ALKPHOS 86 93  BILITOT 0.7 0.4  PROT 5.5* 5.9*  ALBUMIN 1.8* 2.0*   Recent Labs  Lab 06/23/18 0857  LIPASE 18   No results for input(s): AMMONIA in the last 168 hours. Coagulation Profile: Recent Labs  Lab 06/28/18 2225  INR 1.25   Cardiac Enzymes: Recent Labs  Lab 06/28/18 2225 06/29/18 0314  TROPONINI 0.05* 0.06*   BNP (last 3 results) No results for input(s): PROBNP in the last 8760 hours. HbA1C: No results for input(s): HGBA1C in the last 72 hours. CBG: Recent Labs  Lab 06/26/18 0026 06/26/18 0439 06/26/18 0732 06/26/18 1110 06/26/18 1623  GLUCAP 89 149* 91 127* 133*   Lipid Profile: No results for input(s): CHOL, HDL, LDLCALC, TRIG, CHOLHDL, LDLDIRECT in the last 72 hours. Thyroid Function Tests: Recent Labs    06/29/18 0314  TSH 17.789*   Anemia Panel: No results for input(s): VITAMINB12, FOLATE, FERRITIN, TIBC, IRON, RETICCTPCT in the last 72 hours. Sepsis Labs: Recent Labs  Lab 06/28/18 2256 06/29/18 0206 06/29/18 0314  06/29/18 0351  PROCALCITON  --   --  0.52  --   LATICACIDVEN 2.44* 2.34*  --  1.3    Recent Results (from the past 240 hour(s))  Blood Culture (routine x 2)     Status: None (Preliminary result)   Collection Time: 06/28/18 10:25 PM  Result Value Ref Range Status   Specimen Description BLOOD RIGHT ANTECUBITAL  Final   Special Requests   Final    BOTTLES DRAWN AEROBIC AND ANAEROBIC Blood Culture results may not be optimal due to an inadequate volume of blood received in culture bottles   Culture   Final    NO GROWTH < 12 HOURS Performed at [facility] [address] [place], [place] [address]    Report Status PENDING  Incomplete  Blood Culture (routine x 2)     Status: None (Preliminary result)   Collection Time: 06/28/18 10:45 PM  Result Value Ref Range Status   Specimen Description SITE NOT SPECIFIED  Final   Special Requests   Final    BOTTLES DRAWN AEROBIC AND ANAEROBIC Blood Culture adequate volume   Culture   Final    NO GROWTH < 12 HOURS Performed at [facility], [place] [address], [place], [place] [address]    Report Status PENDING  Incomplete         Radiology Studies: Dg Abdomen Peg Tube Location  Result Date: 06/29/2018 CLINICAL DATA:  Initial evaluation for PEG tube placement EXAM: ABDOMEN - 1 VIEW COMPARISON:  Prior radiograph from 06/22/2018 FINDINGS: Contrast material has been instilled via Corra Kaine percutaneous gastrostomy tube. Contrast seen filling the gastric body and fundus. No extraluminal contrast to suggest leak or malpositioning. Nonobstructive bowel gas pattern. Large amount of retained stool within the colon, suggesting constipation. Cholecystectomy clips noted. Right greater than left bilateral pleural effusions with associated bibasilar opacities partially visualized at the lung bases. Degenerative changes noted within the lower lumbar spine. IMPRESSION: 1. Percutaneous gastrostomy tube in place without complication. No extraluminal contrast to  suggest malpositioning or leak. 2. Large volume retained stool within the colon, suggesting constipation. 3. Bilateral right greater than left pleural effusions with associated bibasilar atelectasis and/or consolidation. Electronically Signed   By: Pincus Badder.D.  On: 06/29/2018 02:29   Dg Chest Port 1 View  Result Date: 06/28/2018 CLINICAL DATA:  Unresponsive and hypoxic. EXAM: PORTABLE CHEST 1 VIEW COMPARISON:  06/26/2018 FINDINGS: Mild cardiac enlargement. Streaky parenchymal opacities at the left lung base with air bronchograms may reflect areas of atelectasis and/or pneumonia. Moderate right and small left pleural effusions are redemonstrated slightly increased on the left. Emphysematous hyperinflation of the lungs, upper lobe predominant. No acute osseous abnormality. IMPRESSION: Moderate right and small left pleural effusions, slightly increased on the left and stable on the right since prior. Subsegmental atelectasis and/or pneumonia at the left lung base. Emphysematous hyperinflation of the lungs bilaterally. Electronically Signed   By: Ashley Royalty M.D.   On: 06/28/2018 22:44        Scheduled Meds: . budesonide (PULMICORT) nebulizer solution  0.5 mg Nebulization BID  . enoxaparin (LOVENOX) injection  20 mg Subcutaneous Q24H  . feeding supplement (OSMOLITE 1.5 CAL)  237 mL Per Tube TID WC & HS  . folic acid  1 mg Oral Daily  . free water  250 mL Per Tube Q8H  . ipratropium-albuterol  3 mL Nebulization Q4H  . levothyroxine  50 mcg Intravenous Daily  . lipase/protease/amylase  12,000 Units Oral TID WC & HS  . megestrol  800 mg Per Tube Daily  . pantoprazole sodium  20 mg Per Tube BID  . phosphorus  500 mg Per Tube TID WC & HS  . sucralfate  1 g Per Tube TID WC & HS   Continuous Infusions: . ceFEPime (MAXIPIME) IV    . lactated ringers    . vancomycin       LOS: 0 days    Time spent: over 30 min    Fayrene Helper, MD [facility] Pager  [phone]  If 7PM-7AM, please contact night-coverage [email or web address] Password [email or web address] 06/29/2018, 8:00 AM

## 2018-06-29 NOTE — ED Notes (Signed)
I-Stat Lactic Acid results of 2.34 reported to [facility].

## 2018-06-29 NOTE — ED Notes (Signed)
Russell Williamson garner- [phone]

## 2018-06-29 NOTE — ED Notes (Signed)
Attempted to call report

## 2018-06-29 NOTE — Procedures (Addendum)
Ultrasound-guided diagnostic and therapeutic right thoracentesis performed yielding 1.5 liters of slightly hazy, yellow fluid. No immediate complications. Follow-up chest x-ray pending. A portion of the fluid was sent to the lab for preordered studies.Marland Kitchen

## 2018-06-29 NOTE — Progress Notes (Signed)
ABG values reported to MD at this time, BiPAP to be initiated per MD verbal order

## 2018-06-29 NOTE — Progress Notes (Addendum)
Pharmacy Antibiotic Note  Russell Williamson is a 62 y.o. male admitted on 06/28/2018 with pneumonia.  Pharmacy has been consulted for Vancomycin/Cefepime dosing. Pt just discharged a few days ago. WBC up (6.4>>11.5). Pt with extremely low body weight, ~37 kg. Renal function appears OK, but harder to estimate with the lower body weight/muscle mass.   Plan: Vancomycin 750 mg IV q24h Cefepime 1g IV q12h Trend WBC, temp, renal function  F/U infectious work-up Drug levels as indicated  Temp (24hrs), Avg:100.4 F (38 C), Min:100.4 F (38 C), Max:100.4 F (38 C)  Recent Labs  Lab 06/22/18 0835 06/23/18 0857 06/24/18 0725 06/24/18 0911 06/25/18 0623 06/26/18 0939 06/28/18 2225 06/28/18 2255 06/28/18 2256 06/29/18 0206  WBC 6.6  --  5.3 5.4 6.4  --  11.5*  --   --   --   CREATININE 0.44* 0.39* 0.36*  --   --  0.35* 0.50* 0.50*  --   --   LATICACIDVEN  --   --   --   --   --   --   --   --  2.44* 2.34*    Estimated Creatinine Clearance: 50 mL/min (A) (by C-G formula based on SCr of 0.5 mg/dL (L)).    No Known Allergies   Narda Bonds 06/29/2018 3:49 AM

## 2018-06-29 NOTE — Progress Notes (Signed)
NAME:  Russell Williamson, MRN:  [id number], DOB:  Mar 15, 1956, LOS: 0 ADMISSION DATE:  06/28/2018, CONSULTATION DATE:  06/29/18 REFERRING MD:  Hal Hope CHIEF COMPLAINT:  AMS   Brief History   Russell Williamson is a 63 y.o. male who had recent admission for sepsis due to aspiration. He was found to be aspirating and had PEG placed.  Discharged home 9/23 then presented again 9/25 with AMS.  Found to have hypoxemic and hypercapnic respiratory failure along with HCAP. He was started on BiPAP for ventilatory support. Found to have bilateral pleural effusions and went for thoracentesis 9/26. He was unable to come off BiPAP due to poor mental status.   Significant Hospital Events   9/06 - 9/23 > admit. 9/25 > readmit 9/26 BiPAP  Consults: date of consult/date signed off & final recs:  Pulm  Procedures (surgical and bedside):  9/25 > started on BiPAP.  Significant Diagnostic Tests:  CXR 9/25 > b/l effusions R > L, bibasilar opacities. Thoracentesis 9/26 >>>   Micro Data: Blood 9/25 >  Sputum 9/25 >   Antimicrobials:  Vanc 9/25 >  Cefepime 9/25 >    Subjective:  C/o all over pain. On BiPAP. Remains lethargic.   Objective   Blood pressure 105/73, pulse (!) 42, temperature 99.5 F (37.5 C), temperature source Rectal, resp. rate 15, height 5\' 5"  (1.651 m), weight 45.2 kg, SpO2 92 %.    FiO2 (%):  [40 %] 40 %   Intake/Output Summary (Last 24 hours) at 06/29/2018 1459 Last data filed at 06/29/2018 1316 Gross per 24 hour  Intake 1066.81 ml  Output 100 ml  Net 966.81 ml   Filed Weights   06/29/18 0700  Weight: 45.2 kg    Examination: General: Cachectic adult male on BiPAP Neuro: Somnolent. Does wake up briefly to verbal stimuli to answer simple questions.  HEENT: [place]/AT. PERRL, No JVD Cardiovascular: RRR, no M/R/G.  Lungs: Respirations shallow, unlabored. Coarse bilateral breath sounds Abdomen: BS x 4, soft, NT/ND.  Musculoskeletal: No gross deformities, no edema.  Skin:  Intact, warm, no rashes.  Assessment & Plan:   Acute on chronic hypoxemic and hypercapnic respiratory failure: on 2L O2 at home. This is multifactorial in the setting of bilateral effusions and chronic COPD. I suspect he has continued to aspirate as well. With recent admission this would qualify as HCAP.  Initially placed on BiPAP, but then was unable to liberate from BIPAP due to lethargy, despite ABG improvement.  -Give him a rest from BIPAP. He is not a great candidate for BIPAP at this point, honestly.  -Supplemental O2 to keep SpO2 88-95% -Repeat ABG this evening to gauge how he is doing off BiPAP.  -Incentive spirometry -Flutter valve -Chest PT -Need to address goals of care, but if nothing changes, I would have a low threshold to intubate him.   HCAP - he is a Museum/gallery conservator, who has been recommended dysphagia 1 diet with honey thick liquids earlier this week. He had PEG placed for this reason. Sounds like he is having trouble managing his secretions on exam.  - Continue empiric abx. - Follow cultures.  B/l pleural effusions R > L with atelectasis. - Thoracentesis done 9/26, labs pending.  - IS  COPD +/- acute exacerbation - Scheduled bronchodilator nebs and nebulized steroids.  - Hold off systemic steroids.   Failure to thrive - he has bitemporal wasting and is generally cachectic  - He has been seen by palliative care as recently as 9/16, at  which point he elected to continue with full aggressive care and full code.   Disposition / Summary of Today's Plan 06/29/18   Monitor off BiPAP. ABG this evening. Low threshold for intubation.     Diet: NPO for now. Pain/Anxiety/Delirium protocol (if indicated): N/A. VAP protocol (if indicated): N/A DVT prophylaxis: SCD's / lovenox GI prophylaxis: Protonix Hyperglycemia protocol: N/A Mobility: Bedrest Code Status: Full Family Communication: None available  Labs   CBC: Recent Labs  Lab 06/24/18 0725 06/24/18 0911  06/25/18 0623 06/28/18 2225 06/28/18 2255 06/29/18 0314  WBC 5.3 5.4 6.4 11.5*  --  13.4*  NEUTROABS  --   --   --  10.1*  --  11.5*  HGB 6.8* 7.0* 9.5* 7.8* 6.8* 7.4*  HCT 20.8* 21.4* 28.2* 24.2* 20.0* 23.4*  MCV 106.1* 105.9* 101.4* 107.1*  --  106.8*  PLT 204 210 209 354  --  226   Basic Metabolic Panel: Recent Labs  Lab 06/23/18 0857 06/24/18 0725 06/26/18 0939 06/28/18 2225 06/28/18 2255 06/29/18 0314  NA 135 138 135 130* 129* 127*  K 2.5* 4.2 4.8 3.6 3.7 3.7  CL 90* 98 95* 92* 86* 87*  CO2 33* 34* 29 29  --  30  GLUCOSE 70 148* 118* 160* 163* 182*  BUN 7* 5* 7* 15 20 18   CREATININE 0.39* 0.36* 0.35* 0.50* 0.50* 0.47*  CALCIUM 7.9* 7.5* 7.5* 6.8*  --  7.3*  MG 1.6* 1.8  --   --   --   --    GFR: Estimated Creatinine Clearance: 61.2 mL/min (A) (by C-G formula based on SCr of 0.47 mg/dL (L)). Recent Labs  Lab 06/24/18 0911 06/25/18 0623 06/28/18 2225 06/28/18 2256 06/29/18 0206 06/29/18 0314 06/29/18 0351 06/29/18 0825  PROCALCITON  --   --   --   --   --  0.52  --   --   WBC 5.4 6.4 11.5*  --   --  13.4*  --   --   LATICACIDVEN  --   --   --  2.44* 2.34*  --  1.3 2.7*   Liver Function Tests: Recent Labs  Lab 06/28/18 2225 06/29/18 0314  AST 28 27  ALT 26 29  ALKPHOS 86 93  BILITOT 0.7 0.4  PROT 5.5* 5.9*  ALBUMIN 1.8* 2.0*   Recent Labs  Lab 06/23/18 0857 06/29/18 1118  LIPASE 18 18   No results for input(s): AMMONIA in the last 168 hours. ABG    Component Value Date/Time   PHART 7.429 06/29/2018 0623   PCO2ART 53.0 (H) 06/29/2018 0623   PO2ART 213.0 (H) 06/29/2018 0623   HCO3 35.1 (H) 06/29/2018 0623   TCO2 37 (H) 06/29/2018 0623   ACIDBASEDEF 0.6 02/14/2018 0610   O2SAT 100.0 06/29/2018 0623    Coagulation Profile: Recent Labs  Lab 06/28/18 2225  INR 1.25   Cardiac Enzymes: Recent Labs  Lab 06/28/18 2225 06/29/18 0314 06/29/18 1118  TROPONINI 0.05* 0.06* 0.04*   HbA1C: Hgb A1c MFr Bld  Date/Time Value Ref Range Status   02/11/2018 12:44 AM 6.2 (H) 4.8 - 5.6 % Final    Comment:    (NOTE) Pre diabetes:          5.7%-6.4% Diabetes:              >6.4% Glycemic control for   <7.0% adults with diabetes    CBG: Recent Labs  Lab 06/26/18 0026 06/26/18 0439 06/26/18 0732 06/26/18 1110 06/26/18 1623  GLUCAP 89 149*  91 127* 133*    Admitting History of Present Illness.    Russell Williamson is a 62 y.o. 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   He had recent admission 06/09/18 through 06/26/18 for sepsis due to aspiration PNA.  He was discharged home; however, he presented back to [facility] ED on 9/25 with AMS.  Family felt that he was minimally responsive and had increased WOB; therefore, they called EMS. Of note, during that admission, he had palliative care consult 9/16 and decision was made to continue with full scope of care / full code status.  In ED, CXR showed left basilar infiltrate and he was found to have hypoxemic and hypercapnic respiratory failure for which he was started on BiPAP.  Also found to have multiple metabolic derangements that are being managed by [facility].  He was admitted by the hospitalist who had concerns that he might require intubation; therefore, PCCM was asked to see in consultation.  At the time of my evaluation, pt is somnolent but easily arouses to voice and is able to follow basic commands.  He was on BiPAP on settings 14 / 8 with rate 12.  Vt on these setting was only in 200 range. I adjusted settings to 20 / 10 with rate 18 and Vt improved to 500 - 600 range.  A repeat ABG has been ordered for 0630.   Review of Systems:   Unable to obtain as pt is on BiPAP.  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   Surgical History    Past Surgical History:  Procedure Laterality Date  . BILE DUCT STENT PLACEMENT     hx of  . CHOLECYSTECTOMY  04/2005  . DIRECT LARYNGOSCOPY  05/23/2012   Procedure: DIRECT LARYNGOSCOPY;  Surgeon: Rozetta Nunnery, MD;  Location: [place];  Service: ENT;  Laterality: N/A;  . DIRECT LARYNGOSCOPY N/A 03/23/2013   Procedure: DIRECT LARYNGOSCOPY;  Surgeon: Rozetta Nunnery, MD;  Location: [place];  Service: ENT;  Laterality: N/A;  . ESOPHAGEAL DILATION N/A 03/23/2013   Procedure: ESOPHAGEAL DILATION;  Surgeon: Rozetta Nunnery, MD;  Location: [place];  Service: ENT;  Laterality: N/A;  . ESOPHAGOGASTRODUODENOSCOPY (EGD) WITH PROPOFOL N/A 03/01/2017   Procedure: ESOPHAGOGASTRODUODENOSCOPY (EGD) WITH PROPOFOL;  Surgeon: Ladene Artist, MD;  Location: [facility];  Service: Endoscopy;  Laterality: N/A;  . FLEXIBLE SIGMOIDOSCOPY N/A 03/01/2017   Procedure: FLEXIBLE SIGMOIDOSCOPY;  Surgeon: Ladene Artist, MD;  Location: [facility];  Service: Endoscopy;  Laterality: N/A;  . IR GASTROSTOMY TUBE MOD SED  06/19/2018  . IR GENERIC HISTORICAL  04/29/2016   IR US GUIDE VASC ACCESS RIGHT 04/29/2016 [facility]  . IR GENERIC HISTORICAL  04/29/2016   IR FLUORO GUIDE CV LINE RIGHT 04/29/2016 [facility]  . IR GENERIC HISTORICAL  05/12/2016   IR US GUIDE VASC ACCESS LEFT 05/12/2016 [facility]  . IR  GENERIC HISTORICAL   05/12/2016   IR FLUORO GUIDE CV LINE LEFT 05/12/2016 [facility]  . IR GENERIC HISTORICAL  06/28/2016   IR US GUIDE VASC ACCESS RIGHT 06/28/2016 Sandi Mariscal, MD [facility]  . IR GENERIC HISTORICAL  06/28/2016   IR FLUORO GUIDE PORT INSERTION RIGHT 06/28/2016 Sandi Mariscal, MD [facility]  . IR REMOVAL TUN ACCESS W/ PORT W/O FL MOD SED  02/10/2017  . MASS BIOPSY  05/23/2012   Procedure: NECK MASS BIOPSY;  Surgeon: Rozetta Nunnery, MD;  Location: [place];  Service: ENT;  Laterality: N/A;  . RADICAL NECK DISSECTION  05/23/2012   w/mass excision  . RADICAL NECK DISSECTION  05/23/2012   Procedure: RADICAL NECK DISSECTION;  Surgeon: Rozetta Nunnery, MD;  Location: [place];  Service: ENT;  Laterality: Left;  . TIBIA FRACTURE SURGERY  1990's   left     Social History   Social History   Socioeconomic History  . Marital status: Single    Spouse name: Not on file  . Number of children: Not on file  . Years of education: Not on file  . Highest education level: Not on file  Occupational History  . Not on file  Social Needs  . Financial resource strain: Not on file  . Food insecurity:    Worry: Not on file    Inability: Not on file  . Transportation needs:    Medical: Not on file    Non-medical: Not on file  Tobacco Use  . Smoking status: Current Every Day Smoker    Packs/day: 0.25    Years: 40.00    Pack years: 10.00    Types: Cigarettes  . Smokeless tobacco: Never Used  . Tobacco comment: hx 1/2 -1 PPD  Substance and Sexual Activity  . Alcohol use: No    Comment: 05/23/2012 hx alcohol abuse, quit ~ 2007  . Drug use: No    Types: Marijuana    Comment: 05/23/2012 "used marijuana in my teens"  . Sexual activity: Never  Lifestyle  . Physical activity:    Days per week: Not on file    Minutes per session: Not on file  . Stress: Not on file  Relationships  . Social connections:    Talks on phone: Not on file    Gets together: Not on file    Attends religious service: Not on file     Active member of club or organization: Not on file    Attends meetings of clubs or organizations: Not on file    Relationship status: Not on file  . Intimate partner violence:    Fear of current or ex partner: Not on file    Emotionally abused: Not on file    Physically abused: Not on file    Forced sexual activity: Not on file  Other Topics Concern  . Not on file  Social History Narrative  . Not on file  ,  reports that he has been smoking cigarettes. He has a 10.00 pack-year smoking history. He has never used smokeless tobacco. He reports that he does not drink alcohol or use drugs.   Family history   His family history includes COPD in his mother. There is no history of Colon cancer.   Allergies No Known Allergies  Home meds  Prior to Admission medications   Medication Sig Start Date End Date Taking? Authorizing Provider  albuterol (PROAIR HFA) 108 (90 Base) MCG/ACT inhaler Inhale 2 puffs into the lungs every 4 (  four) hours as needed for wheezing or shortness of breath.    [provider]  Amino Acids-Protein Hydrolys (FEEDING SUPPLEMENT, PRO-STAT SUGAR FREE 64,) LIQD Place 30 mLs into feeding tube daily. 06/27/18   Oretha Milch D, MD  amLODipine (NORVASC) 2.5 MG tablet Take 2.5 mg by mouth daily. 12/24/16   [provider]  collagenase (SANTYL) ointment Apply topically daily. 06/27/18   Desiree Hane, MD  feeding supplement, ENSURE ENLIVE, (ENSURE ENLIVE) LIQD Take 237 mLs by mouth 3 (three) times daily between meals. Patient taking differently: Take 474 mLs by mouth 3 (three) times daily between meals. 2 cans 02/10/18   Elodia Florence., MD  folic acid (FOLVITE) 1 MG tablet Take 1 mg by mouth daily.  05/14/15   [provider]  guaiFENesin-dextromethorphan (ROBITUSSIN DM) 100-10 MG/5ML syrup Take 15 mLs by mouth every 6 (six) hours as needed for cough. 06/26/18   Desiree Hane, MD  levothyroxine (SYNTHROID, LEVOTHROID) 100 MCG tablet Place 1  tablet (100 mcg total) into feeding tube daily before breakfast. 06/27/18   Oretha Milch D, MD  lipase/protease/amylase (CREON) 12000 units CPEP capsule Take 1 capsule (12,000 Units total) by mouth 4 (four) times daily. Ok to open capsule and mix with applesauce 06/26/18   Oretha Milch D, MD  magnesium oxide (MAG-OX) 400 (241.3 Mg) MG tablet Place 2 tablets (800 mg total) into feeding tube 2 (two) times daily. 06/26/18   Desiree Hane, MD  Maltodextrin-Xanthan Gum ([place]) POWD Add to liquids to make nectar thick consistency for dysphagia Patient not taking: Reported on 04/23/2018 02/10/18   Elodia Florence., MD  megestrol (MEGACE) 400 MG/10ML suspension Place 20 mLs (800 mg total) into feeding tube daily. 06/27/18   Oretha Milch D, MD  mirtazapine (REMERON) 30 MG tablet Take 30 mg by mouth at bedtime.    [provider]  Multiple Vitamin (MULTIVITAMIN WITH MINERALS) TABS tablet Place 1 tablet into feeding tube daily. 06/27/18   Oretha Milch D, MD  nicotine (NICODERM CQ - DOSED IN MG/24 HOURS) 21 mg/24hr patch Place 1 patch (21 mg total) onto the skin daily. 06/27/18   Desiree Hane, MD  oxyCODONE (ROXICODONE) 15 MG immediate release tablet Take 1 tablet (15 mg total) by mouth every 8 (eight) hours as needed for pain. Patient taking differently: Take 15 mg by mouth every 4 (four) hours as needed for pain.  02/17/18   Domenic Polite, MD  OXYGEN Inhale 3 L into the lungs as needed (shortness of breath).    [provider]  pantoprazole sodium (PROTONIX) 40 mg/20 mL PACK Place 10 mLs (20 mg total) into feeding tube 2 (two) times daily. 06/26/18   Desiree Hane, MD  phosphorus (K PHOS NEUTRAL) 144-315-400 MG tablet Place 2 tablets (500 mg total) into feeding tube 4 (four) times daily -  with meals and at bedtime. 06/26/18   Desiree Hane, MD  promethazine (PHENERGAN) 25 MG tablet Take 1 tablet (25 mg total) by mouth every 6 (six) hours as needed for  nausea or vomiting. 05/27/18   Law, Bea Graff, PA-C  sucralfate (CARAFATE) 1 GM/10ML suspension Place 10 mLs (1 g total) into feeding tube 4 (four) times daily -  with meals and at bedtime. 06/26/18   Desiree Hane, MD  traMADol (ULTRAM) 50 MG tablet Place 1 tablet (50 mg total) into feeding tube every 6 (six) hours as needed for up to 5 days for moderate pain.  06/26/18 07/01/18  Desiree Hane, MD  Water For Irrigation, Sterile (FREE WATER) SOLN Place 250 mLs into feeding tube every 8 (eight) hours. 06/26/18   Desiree Hane, MD    Georgann Housekeeper, AGACNP-BC [place] Pager [phone] or [phone]  06/29/2018 3:30 PM

## 2018-06-29 NOTE — Progress Notes (Signed)
RT and RN transported pt from ED to [place] at this time without complications.

## 2018-06-30 ENCOUNTER — Inpatient Hospital Stay: Payer: Self-pay

## 2018-06-30 DIAGNOSIS — Z515 Encounter for palliative care: Secondary | ICD-10-CM

## 2018-06-30 DIAGNOSIS — R627 Adult failure to thrive: Secondary | ICD-10-CM

## 2018-06-30 DIAGNOSIS — Z7189 Other specified counseling: Secondary | ICD-10-CM

## 2018-06-30 DIAGNOSIS — E43 Unspecified severe protein-calorie malnutrition: Secondary | ICD-10-CM

## 2018-06-30 LAB — URINE CULTURE: Culture: NO GROWTH

## 2018-06-30 LAB — EXPECTORATED SPUTUM ASSESSMENT W REFEX TO RESP CULTURE

## 2018-06-30 LAB — PH, BODY FLUID: pH, Body Fluid: 7.7

## 2018-06-30 LAB — T3, FREE: T3 FREE: 1.2 pg/mL — AB (ref 2.0–4.4)

## 2018-06-30 MED ORDER — PRO-STAT SUGAR FREE PO LIQD
30.0000 mL | Freq: Every day | ORAL | Status: DC
  Administered 2018-06-30 – 2018-07-05 (×6): 30 mL
  Filled 2018-06-30 (×6): qty 30

## 2018-06-30 MED ORDER — IPRATROPIUM-ALBUTEROL 0.5-2.5 (3) MG/3ML IN SOLN
3.0000 mL | Freq: Two times a day (BID) | RESPIRATORY_TRACT | Status: DC
  Administered 2018-06-30 – 2018-07-05 (×10): 3 mL via RESPIRATORY_TRACT
  Filled 2018-06-30 (×11): qty 3

## 2018-06-30 MED ORDER — OXYCODONE HCL 5 MG PO TABS
7.5000 mg | ORAL_TABLET | Freq: Four times a day (QID) | ORAL | Status: DC | PRN
  Administered 2018-06-30: 7.5 mg
  Filled 2018-06-30: qty 2

## 2018-06-30 MED ORDER — JUVEN PO PACK
1.0000 | PACK | Freq: Two times a day (BID) | ORAL | Status: DC
  Administered 2018-06-30 – 2018-07-05 (×10): 1
  Filled 2018-06-30 (×11): qty 1

## 2018-06-30 MED ORDER — METRONIDAZOLE 50 MG/ML ORAL SUSPENSION
500.0000 mg | Freq: Three times a day (TID) | ORAL | Status: DC
  Administered 2018-06-30 – 2018-07-01 (×6): 500 mg
  Filled 2018-06-30 (×7): qty 10

## 2018-06-30 MED ORDER — LEVOTHYROXINE SODIUM 100 MCG PO TABS
100.0000 ug | ORAL_TABLET | Freq: Every day | ORAL | Status: DC
  Administered 2018-06-30 – 2018-07-05 (×6): 100 ug
  Filled 2018-06-30 (×6): qty 1

## 2018-06-30 MED ORDER — ORAL CARE MOUTH RINSE
15.0000 mL | Freq: Two times a day (BID) | OROMUCOSAL | Status: DC
  Administered 2018-06-30 – 2018-07-05 (×11): 15 mL via OROMUCOSAL

## 2018-06-30 MED ORDER — OXYCODONE HCL 5 MG PO TABS
7.5000 mg | ORAL_TABLET | ORAL | Status: DC | PRN
  Administered 2018-06-30 – 2018-07-01 (×3): 7.5 mg
  Filled 2018-06-30 (×3): qty 2

## 2018-06-30 MED ORDER — CHLORHEXIDINE GLUCONATE 0.12 % MT SOLN
15.0000 mL | Freq: Two times a day (BID) | OROMUCOSAL | Status: DC
  Administered 2018-06-30 – 2018-07-05 (×10): 15 mL via OROMUCOSAL
  Filled 2018-06-30 (×9): qty 15

## 2018-06-30 MED ORDER — PANCRELIPASE (LIP-PROT-AMYL) 12000-38000 UNITS PO CPEP
12000.0000 [IU] | ORAL_CAPSULE | Freq: Three times a day (TID) | ORAL | Status: DC
  Administered 2018-06-30 – 2018-07-05 (×19): 12000 [IU] via ORAL
  Filled 2018-06-30 (×19): qty 1

## 2018-06-30 MED ORDER — ACETAMINOPHEN 325 MG PO TABS
650.0000 mg | ORAL_TABLET | Freq: Four times a day (QID) | ORAL | Status: DC | PRN
  Administered 2018-07-01 – 2018-07-03 (×2): 650 mg
  Filled 2018-06-30 (×2): qty 2

## 2018-06-30 MED ORDER — POLYETHYLENE GLYCOL 3350 17 G PO PACK
17.0000 g | PACK | Freq: Every day | ORAL | Status: DC
  Administered 2018-06-30: 17 g via ORAL
  Filled 2018-06-30: qty 1

## 2018-06-30 NOTE — Progress Notes (Signed)
0610 Phlebotomy still unable to obtain morning lab work.Text paged NP [place].

## 2018-06-30 NOTE — Consult Note (Signed)
Consultation Note Date: 06/30/2018   Patient Name: Russell Williamson  DOB: [birth year]  MRN: [id number]  Age / Sex: 62 y.o., male  PCP: Harvie Junior, MD Referring Physician: Elodia Florence., *  Reason for Consultation: Establishing goals of care  HPI/Patient Profile: 62 y.o. male  with past medical history of chronic alcholic pancreatitis, tonisllar cancer s/p radiation, HTN, COPD, depression, cocaine abuse, and GERD admitted on 06/28/2018 with altered mental status.   Found to have hypoxemic and hypercapnic respiratory failure along with HCAP. He was started on BiPAP for ventilatory support. Found to have bilateral pleural effusions and went for thoracentesis 9/26. Had recent hospitalization for sepsis due to aspiration. He had PEG placed during that admission and was discharged home 9/23. PMT was consulted during that admission after PEG tube was placed and decision was made for full code/full scope. PMT reconsulted this admission for goals of care.   Clinical Assessment and Goals of Care: I have reviewed medical records including EPIC notes, labs and imaging, received report from RN, assessed the patient and then met with the patient to discuss diagnosis prognosis, GOC, EOL wishes, disposition and options.  I introduced Palliative Medicine as specialized medical care for people living with serious illness. It focuses on providing relief from the symptoms and stress of a serious illness. The goal is to improve quality of life for both the patient and the family.  Patient has been living with his niece. Tells me he was able to get up with a walker prior to admission; but now feels as though he needs more help than this niece can provide.   We discussed his current illness and what it means in the larger context of his on-going co-morbidities.  Natural disease trajectory and expectations at EOL were discussed. Specifically discussed  pneumonia, chronic pancreatitis, and overall failure to thrive.  I attempted to elicit values and goals of care important to the patient.  He tells me he does not want to be a burden on his family.  The difference between aggressive medical intervention and comfort care was considered in light of the patient's goals of care. We discussed getting hospice involved for symptom management and increased QOL. Patient agrees to this.  Advance directives, concepts specific to code status, artifical feeding and hydration, and rehospitalization were considered and discussed. Confirmed DNR. He hopes to avoid future hospitalizations with support of hospice.  Discussed case with social work - patient's desire for SNF with hospice to follow. Social work aware and working on placement.   Questions and concerns were addressed.  He was encouraged to call with questions or concerns.   Primary Decision Maker PATIENT    SUMMARY OF RECOMMENDATIONS   - Patient requests placement in SNF with hospice care - discussed this with social work who is hopeful this can be arranged - Patient c/o abd pain - increased frequency of oxy IR  Code Status/Advance Care Planning:  DNR  Palliative Prophylaxis:   Aspiration, Bowel Regimen, Delirium Protocol, Frequent Pain Assessment, Oral Care and Turn Reposition  Additional Recommendations (Limitations, Scope, Preferences):  Avoid Hospitalization and Minimize Medications  Psycho-social/Spiritual:   Desire for further Chaplaincy support:no  Additional Recommendations: Education on Hospice  Prognosis:   < 6 months  Discharge Planning: [place] with Hospice      Primary Diagnoses: Present on Admission: . Acute respiratory failure with hypoxia ([place]) . Relapsing chronic pancreatitis ([place]) . Protein-calorie malnutrition, severe . Megaloblastic anemia . Hypothyroidism (acquired) . History of  cancer tonsil . COPD with chronic bronchitis ([place]) .  Anemia in chronic illness . Acute metabolic encephalopathy . Bilateral pleural effusion . HCAP (healthcare-associated pneumonia)   I have reviewed the medical record, interviewed the patient and family, and examined the patient. The following aspects are pertinent.  Past Medical History:  Diagnosis Date  . Anemia   . Arthritis   . Cervical adenopathy 04/26/2012  . Chronic back pain    lower  . Chronic pancreatitis ([place])   . Constipation   . COPD (chronic obstructive pulmonary disease) ([place])   . Daily headache 05/23/2012   "small ones"  . Deficiency anemia 04/15/2016  . Dyspnea    with exertion  . G tube feedings ([facility])    hx of  feeding tube in stomach  . GERD (gastroesophageal reflux disease)   . History of blood transfusion ~ 2004   "from the pancreatitis"  . History of radiation therapy 02/02/2011-03/22/2011   head/neck,left tonsil  . Hx of radiation therapy 02/02/11 to 03/22/11   L tonsil  . Hypertension   . Insomnia   . 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 Pancytopenia ([place]) 02/28/2014  . Poor venous access 04/28/2016  . 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 Thrush 11/02/2013  . Tonsillar cancer ([place]) 12/15/2010   Left  . Urinary hesitancy    Social History   Socioeconomic History  . Marital status: Single    Spouse name: Not on file  . Number of children: Not on file  . Years of education: Not on file  . Highest education level: Not on file  Occupational History  . Not on file  Social Needs  . Financial resource strain: Not on file  . Food insecurity:    Worry: Not on file    Inability: Not on file  . Transportation needs:    Medical: Not on file    Non-medical: Not on file  Tobacco Use  . Smoking status: Current Every Day Smoker    Packs/day: 0.25    Years: 40.00    Pack years: 10.00    Types: Cigarettes  . Smokeless tobacco: Never Used  . Tobacco comment: hx 1/2 -1 PPD  Substance and Sexual Activity   . Alcohol use: No    Comment: 05/23/2012 hx alcohol abuse, quit ~ 2007  . Drug use: No    Types: Marijuana    Comment: 05/23/2012 "used marijuana in my teens"  . Sexual activity: Never  Lifestyle  . Physical activity:    Days per week: Not on file    Minutes per session: Not on file  . Stress: Not on file  Relationships  . Social connections:    Talks on phone: Not on file    Gets together: Not on file    Attends religious service: Not on file    Active member of club or organization: Not on file    Attends meetings of clubs or organizations: Not on file    Relationship status: Not on file  Other Topics Concern  . Not on file  Social History Narrative  . Not on file   Family History  Problem Relation Age of Onset  . COPD Mother   . Colon cancer Neg Hx    Scheduled Meds: . budesonide (PULMICORT) nebulizer solution  0.5 mg Nebulization BID  . chlorhexidine  15 mL Mouth Rinse BID  . enoxaparin (LOVENOX) injection  20 mg Subcutaneous Q24H  .  feeding supplement (OSMOLITE 1.5 CAL)  237 mL Per Tube TID WC & HS  . feeding supplement (PRO-STAT SUGAR FREE 64)  30 mL Per Tube Daily  . folic acid  1 mg Oral Daily  . free water  250 mL Per Tube Q8H  . ipratropium-albuterol  3 mL Nebulization BID  . levothyroxine  100 mcg Per Tube QAC breakfast  . lipase/protease/amylase  12,000 Units Oral TID WC & HS  . mouth rinse  15 mL Mouth Rinse q12n4p  . megestrol  800 mg Per Tube Daily  . metroNIDAZOLE  500 mg Per Tube TID  . nutrition supplement (JUVEN)  1 packet Per Tube BID BM  . pantoprazole sodium  20 mg Per Tube BID  . phosphorus  500 mg Per Tube TID WC & HS  . polyethylene glycol  17 g Oral Daily  . sucralfate  1 g Per Tube TID WC & HS   Continuous Infusions: . ceFEPime (MAXIPIME) IV 1 g (06/30/18 0934)   PRN Meds:.acetaminophen, albuterol, ondansetron **OR** ondansetron (ZOFRAN) IV, oxyCODONE No Known Allergies Review of Systems  Constitutional: Positive for activity change,  appetite change and fatigue.  Respiratory: Negative for shortness of breath.   Cardiovascular: Negative for chest pain, palpitations and leg swelling.  Neurological: Positive for weakness.  Psychiatric/Behavioral: Positive for dysphoric mood.    Physical Exam  Constitutional: He is oriented to person, place, and time. He appears cachectic. He has a sickly appearance. No distress.  Temporal wasting  HENT:  Head: Normocephalic and atraumatic.  Cardiovascular: Normal rate and regular rhythm.  Pulmonary/Chest: Effort normal. No accessory muscle usage. No respiratory distress.  Abdominal: Soft.  Musculoskeletal:       Right lower leg: Normal.       Left lower leg: Normal.  Neurological: He is alert and oriented to person, place, and time.  Skin: Skin is dry.    Vital Signs: BP 106/63 (BP Location: Left Arm)   Pulse (!) 111   Temp (!) 97.4 F (36.3 C) (Axillary)   Resp 11   Ht 5' 5"  (1.651 m)   Wt 44.4 kg   SpO2 99%   BMI 16.29 kg/m  Pain Scale: 0-10   Pain Score: Asleep   SpO2: SpO2: 99 % O2 Device:SpO2: 99 % O2 Flow Rate: .O2 Flow Rate (L/min): 2 L/min  IO: Intake/output summary:   Intake/Output Summary (Last 24 hours) at 06/30/2018 1548 Last data filed at 06/30/2018 0645 Gross per 24 hour  Intake 2097.61 ml  Output 400 ml  Net 1697.61 ml    LBM: Last BM Date: 06/30/18 Baseline Weight: Weight: 45.2 kg Most recent weight: Weight: 44.4 kg     Palliative Assessment/Data: PPS 40%     Time Total: 70 minutes Greater than 50%  of this time was spent counseling and coordinating care related to the above assessment and plan.  Juel Burrow, DNP, AGNP-C Palliative Medicine Team [phone] Pager: [phone]

## 2018-06-30 NOTE — Evaluation (Signed)
PCCM Overnight Bedside Evaluation   Pt seen earlier on admission requiring increased NIPPV settings Reviewed daytime events and notes S/p thoracentesis  Recent Labs: 7.49/44/75/33 on ABG at 1722 Evaluated at bedside at 2235 Pt continues to be awake responsive following commands and tolerating NIPPV. Discussed with RT and will continue current plan   Signed Dr Seward Carol [facility] Locums

## 2018-06-30 NOTE — Progress Notes (Signed)
NAME:  Russell Williamson, MRN:  [id number], DOB:  1956/02/18, LOS: 1 ADMISSION DATE:  06/28/2018, CONSULTATION DATE:  06/29/18 REFERRING MD:  Hal Hope CHIEF COMPLAINT:  AMS   Brief History   Russell Williamson is a 62 y.o. male who had recent admission for sepsis due to aspiration. He was found to be aspirating and had PEG placed.  Discharged home 9/23 then presented again 9/25 with AMS.  Found to have hypoxemic and hypercapnic respiratory failure along with HCAP. He was started on BiPAP for ventilatory support. Found to have bilateral pleural effusions and went for thoracentesis 9/26. He was unable to come off BiPAP due to poor mental status.   Significant Hospital Events   9/06 - 9/23 > admit. 9/25 > readmit for HCAP 9/26 BiPAP for respiratory distress. Moved to SDU 9/27 improved, off BiPAP  Consults: date of consult/date signed off & final recs:  Pulm  Procedures (surgical and bedside):  9/25 > started on BiPAP.  Significant Diagnostic Tests:  CXR 9/25 > b/l effusions R > L, bibasilar opacities. Thoracentesis 9/26 > LD 78, WBC 460, mesothelial cells.>>>  Micro Data: Blood 9/25 >  Sputum 9/25 >   Antimicrobials:  Vanc 9/25 >  Cefepime 9/25 >    Subjective:  C/o all over pain. On BiPAP. Remains lethargic.   Objective   Blood pressure 106/63, pulse (!) 111, temperature (!) 97.4 F (36.3 C), temperature source Axillary, resp. rate 11, height 5\' 5"  (1.651 m), weight 44.4 kg, SpO2 99 %.    FiO2 (%):  [40 %] 40 %   Intake/Output Summary (Last 24 hours) at 06/30/2018 1414 Last data filed at 06/30/2018 0645 Gross per 24 hour  Intake 2097.61 ml  Output 400 ml  Net 1697.61 ml   Filed Weights   06/29/18 0700 06/30/18 0409  Weight: 45.2 kg 44.4 kg    Examination: General: Cachectic adult male in NAD Neuro: Sleeping in bed, but easily arouses to verbal and carries conversation HEENT: [place]/AT. PERRL, No JVD Cardiovascular: RRR, no M/R/G.  Lungs: Respirations even, unlabored.  Coarse bilaterally.  Abdomen: BS x 4, soft, NT/ND.  Musculoskeletal: No gross deformities, no edema.  Skin: Intact, warm, no rashes.  Assessment & Plan:   Acute on chronic hypoxemic and hypercapnic respiratory failure: on 2L O2 at home. This is multifactorial in the setting of bilateral effusions and chronic COPD. I suspect he has continued to aspirate as well. With recent admission this would qualify as HCAP.  Initially placed on BiPAP, but then was unable to liberate from BIPAP due to lethargy, despite ABG improvement. Now 9/27 he is off BiPAP and tolerating well.  -Supplemental O2 to keep SpO2 88-95% -BiPAP PRN if he is able to manage his secretions - IS, FV, Chest PT - Had discussion regarding GOC. He clearly states he would never want to be put on life support.   HCAP - he is a Museum/gallery conservator, who has been recommended dysphagia 1 diet with honey thick liquids earlier this week. He had PEG placed for this reason.  He is more awake today and seems to be managing his oral secretions better. - Continue empiric abx. - Follow cultures. - NPO  B/l pleural effusions R > L with atelectasis. - Thoracentesis done 9/26, cultures/cytology pending  COPD +/- acute exacerbation - Scheduled bronchodilator nebs and nebulized steroids.  - Hold off systemic steroids.   Failure to thrive - he has bitemporal wasting and is generally cachectic  - He has been seen by palliative  care as recently as 9/16, at which point he elected to continue with full aggressive care and full code.  Further discussion 9/27 determines at this point he would not want to be placed on life support when explained that even in the best case scenario if he required life support he would be left weaker than he already is.   Disposition / Summary of Today's Plan 06/30/18   Looking better today. Off BiPAP. Continue tx for HCAP/aspiration. No DNR. PCCM will sign off.     Diet: NPO for now. Pain/Anxiety/Delirium protocol (if  indicated): N/A. VAP protocol (if indicated): N/A DVT prophylaxis: SCD's / lovenox GI prophylaxis: Protonix Hyperglycemia protocol: N/A Mobility: Bedrest Code Status: DNR Family Communication: None available  Labs   CBC: Recent Labs  Lab 06/24/18 0725 06/24/18 0911 06/25/18 0623 06/28/18 2225 06/28/18 2255 06/29/18 0314  WBC 5.3 5.4 6.4 11.5*  --  13.4*  NEUTROABS  --   --   --  10.1*  --  11.5*  HGB 6.8* 7.0* 9.5* 7.8* 6.8* 7.4*  HCT 20.8* 21.4* 28.2* 24.2* 20.0* 23.4*  MCV 106.1* 105.9* 101.4* 107.1*  --  106.8*  PLT 204 210 209 354  --  562   Basic Metabolic Panel: Recent Labs  Lab 06/24/18 0725 06/26/18 0939 06/28/18 2225 06/28/18 2255 06/29/18 0314  NA 138 135 130* 129* 127*  K 4.2 4.8 3.6 3.7 3.7  CL 98 95* 92* 86* 87*  CO2 34* 29 29  --  30  GLUCOSE 148* 118* 160* 163* 182*  BUN 5* 7* 15 20 18   CREATININE 0.36* 0.35* 0.50* 0.50* 0.47*  CALCIUM 7.5* 7.5* 6.8*  --  7.3*  MG 1.8  --   --   --   --    GFR: Estimated Creatinine Clearance: 60.1 mL/min (A) (by C-G formula based on SCr of 0.47 mg/dL (L)). Recent Labs  Lab 06/24/18 0911 06/25/18 0623 06/28/18 2225 06/28/18 2256 06/29/18 0206 06/29/18 0314 06/29/18 0351 06/29/18 0825  PROCALCITON  --   --   --   --   --  0.52  --   --   WBC 5.4 6.4 11.5*  --   --  13.4*  --   --   LATICACIDVEN  --   --   --  2.44* 2.34*  --  1.3 2.7*   Liver Function Tests: Recent Labs  Lab 06/28/18 2225 06/29/18 0314  AST 28 27  ALT 26 29  ALKPHOS 86 93  BILITOT 0.7 0.4  PROT 5.5* 5.9*  ALBUMIN 1.8* 2.0*   Recent Labs  Lab 06/29/18 1118  LIPASE 18   No results for input(s): AMMONIA in the last 168 hours. ABG    Component Value Date/Time   PHART 7.498 (H) 06/29/2018 1722   PCO2ART 44.2 06/29/2018 1722   PO2ART 75.4 (L) 06/29/2018 1722   HCO3 33.8 (H) 06/29/2018 1722   TCO2 37 (H) 06/29/2018 0623   ACIDBASEDEF 0.6 02/14/2018 0610   O2SAT 95.6 06/29/2018 1722    Coagulation Profile: Recent Labs    Lab 06/28/18 2225  INR 1.25   Cardiac Enzymes: Recent Labs  Lab 06/28/18 2225 06/29/18 0314 06/29/18 1118  TROPONINI 0.05* 0.06* 0.04*   HbA1C: Hgb A1c MFr Bld  Date/Time Value Ref Range Status  02/11/2018 12:44 AM 6.2 (H) 4.8 - 5.6 % Final    Comment:    (NOTE) Pre diabetes:          5.7%-6.4% Diabetes:              >  6.4% Glycemic control for   <7.0% adults with diabetes    CBG: Recent Labs  Lab 06/26/18 0026 06/26/18 0439 06/26/18 0732 06/26/18 1110 06/26/18 1623  GLUCAP 89 149* 91 127* 133*    Admitting History of Present Illness.    Russell Williamson is a 62 y.o. 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   He had recent admission 06/09/18 through 06/26/18 for sepsis due to aspiration PNA.  He was discharged home; however, he presented back to [facility] ED on 9/25 with AMS.  Family felt that he was minimally responsive and had increased WOB; therefore, they called EMS. Of note, during that admission, he had palliative care consult 9/16 and decision was made to continue with full scope of care / full code status.  In ED, CXR showed left basilar infiltrate and he was found to have hypoxemic and hypercapnic respiratory failure for which he was started on BiPAP.  Also found to have multiple metabolic derangements that are being managed by [facility].  He was admitted by the hospitalist who had concerns that he might require intubation; therefore, PCCM was asked to see in consultation.  At the time of my evaluation, pt is somnolent but easily arouses to voice and is able to follow basic commands.  He was on BiPAP on settings 14 / 8 with rate 12.  Vt on these setting was only in 200 range. I adjusted settings to 20 / 10 with rate 18 and Vt improved to 500 - 600 range.  A repeat ABG has been ordered for 0630.    Georgann Housekeeper, AGACNP-BC [place] Pager [phone] or [phone]  06/30/2018 2:26 PM

## 2018-06-30 NOTE — Progress Notes (Signed)
PROGRESS NOTE    Russell Williamson  WPV:[id number] DOB: 06-18-1956 DOA: 06/28/2018 PCP: Harvie Junior, MD  Brief Narrative: Russell Williamson is Russell Williamson 62 y.o. male with history of tonsillar cancer status post radiation, chronic recurrent pancreatitis with cachexia and severe malnutrition, hypothyroidism, anemia, hypertension, COPD chronic pain who was just recently admitted for aspiration pneumonia during which patient also underwent PEG tube placement for dysphagia was brought to the ER after patient became confused unresponsive and respiratory distress.  EMS on arrival at home found patient to be hypoxic and was placed on nonrebreather was mildly hypotensive tachycardic and was brought to the ER.  Patient is confused and not much history was obtained from the patient.  Most of my history was obtained from the ER physician and patient's nurse and previous records.   Assessment & Plan:   Principal Problem:   Acute respiratory failure with hypoxia (HCC) Active Problems:   History of cancer tonsil   Hx of radiation therapy   Hypothyroidism (acquired)   S/P gastrostomy ([place])   Relapsing chronic pancreatitis (HCC)   Anemia in chronic illness   Megaloblastic anemia   Protein-calorie malnutrition, severe   COPD with chronic bronchitis (HCC)   Acute metabolic encephalopathy   Bilateral pleural effusion   HCAP (healthcare-associated pneumonia)   1. Acute respiratory failure with hypoxia and hypercarbia likely from HCAP  Possible Aspiration Pneumonia  Bilateral Pleural Effusions:  1. Vancomycin/cefepime -> negative MRSA PCR, narrow to cefepime (will add flagyl for concern for aspiration pna) 2. Follow blood cx, sputum cx, narrow abx as able 3. ABG improved, per discussion with PCCM yesterday, thought to be Russell Williamson poor BIPAP candidate with his chronic aspiration and mental status (though now mental status is improved), may need intubation if decompensates -> recommending [place]  discussion 4. Bilateral R>L effusions, s/p thoracentesis.  Appears transudative, but serum LDH pending collection.  Follow labs (culture, pH, cytology). 5. Speech to see this AM, discussed with speech who noted prior PO diet mostly for comfort given aspiration, planning to see and discuss with pt again.  2. Goals of Care: seen by palliative care on 9/16 and pt desired full scope care at that time.  PC reconsulted in setting of recurrent aspiration pneumonia.  3. Acute metabolic encephalopathy likely from hypoxia and hypercarbia - much improved  4. Hypothyroidism TSH 17.789, follow free T3 (low)/T4 (wnl).  Just started on synthroid earlier this month, continue current dose for now, will need repeat labs after discharge.    5. Megaloblastic anemia has received transfusion during last admission.  Stable.  Transfuse for <7.  Normal B12, folate.  Iron panel suggestive of AOCD.  6. History of recurrent pancreatitis with cachexia and malnutrition.  Continue creon.  He c/o pain c/w chronic pancreatitis, will follow lipase (wnl).  With chronic abdominal pain, will restart pt on lower dose of oxycodone and follow and adjust as needed.  7. Recent PEG tube placement for feedings.  Dietician c/s.  8. COPD we will keep patient on nebulizers.  No wheezing on my exam this morning, hypercarbia improved on bipap.  9. History of tonsillar cancer status post radiation.  10. Elevated troponin: mild and flat, c/w priors, no c/o chest pain.  EKG appears c/w priors.  Follow.  11. Hyponatremia:  Will give IVF as pt appears hypovolemic to euvolemic on exam.  Follow urine sodium/osms (pending collection).  Of note, he's receiving free water per tube.  Labs pending today.  12. Atrial Fibrillation: appears new on telemetry.  Follow echo.  Chadsvasc is 1 for HTN, will hold off on systemic anticoagulation at this time.  Follow EKG.  13. Difficult lab draw: will place PICC for access.  Labs pending this  AM.  14. Hypotension: occurred after thora, improved with IVF.  Elevated lactate yesterday, repeat pending due to difficulty with lab draws, follow once access  DVT prophylaxis: lovenox Code Status: full Family Communication: none at bedside Disposition Plan: pending improvement   Consultants:   PCCM  Procedures:   none  Antimicrobials:  Anti-infectives (From admission, onward)   Start     Dose/Rate Route Frequency Ordered Stop   06/29/18 2200  vancomycin (VANCOCIN) 500 mg in sodium chloride 0.9 % 100 mL IVPB  Status:  Discontinued     500 mg 100 mL/hr over 60 Minutes Intravenous Every 24 hours 06/29/18 0357 06/29/18 0400   06/29/18 2200  vancomycin (VANCOCIN) IVPB 750 mg/150 ml premix     750 mg 150 mL/hr over 60 Minutes Intravenous Every 24 hours 06/29/18 0400     06/29/18 1000  ceFEPIme (MAXIPIME) 1 g in sodium chloride 0.9 % 100 mL IVPB     1 g 200 mL/hr over 30 Minutes Intravenous Every 12 hours 06/29/18 0357     06/29/18 0300  vancomycin (VANCOCIN) IVPB 1000 mg/200 mL premix  Status:  Discontinued     1,000 mg 200 mL/hr over 60 Minutes Intravenous  Once 06/29/18 0248 06/29/18 0250   06/29/18 0300  ceFEPIme (MAXIPIME) 2 g in sodium chloride 0.9 % 100 mL IVPB  Status:  Discontinued     2 g 200 mL/hr over 30 Minutes Intravenous  Once 06/29/18 0248 06/29/18 0251   06/28/18 2245  vancomycin (VANCOCIN) IVPB 1000 mg/200 mL premix     1,000 mg 200 mL/hr over 60 Minutes Intravenous  Once 06/28/18 2236 06/29/18 0007   06/28/18 2245  ceFEPIme (MAXIPIME) 2 g in sodium chloride 0.9 % 100 mL IVPB     2 g 200 mL/hr over 30 Minutes Intravenous  Once 06/28/18 2236 06/28/18 2335      Subjective: Notes chronic abdominal pain and SOB.  Objective: Vitals:   06/30/18 0020 06/30/18 0021 06/30/18 0349 06/30/18 0409  BP: 100/61   103/63  Pulse: (!) 105  (!) 102   Resp: 15  19 17   Temp: 54.6 F (36.5 C)   99.7 F (37.6 C)  TempSrc: Axillary   Axillary  SpO2: 96% 98% 96% 99%   Weight:    44.4 kg  Height:        Intake/Output Summary (Last 24 hours) at 06/30/2018 0741 Last data filed at 06/30/2018 0645 Gross per 24 hour  Intake 2970.99 ml  Output 400 ml  Net 2570.99 ml   Filed Weights   06/29/18 0700 06/30/18 0409  Weight: 45.2 kg 44.4 kg    Examination:  General: No acute distress. Cardiovascular: Heart sounds show Russell Williamson irregular rate, and rhythm.  Lungs: coarse breath sounds on 4 L [place] Abdomen: Soft, diffusely tender, nondistended, PEG in place Neurological: Alert and oriented 3. Moves all extremities 4. Cranial nerves II through XII grossly intact. Skin: Warm and dry. No rashes or lesions. Extremities: No clubbing or cyanosis. No edema.  Psychiatric: Mood and affect are normal. Insight and judgment are appropriate.    Data Reviewed: I have personally reviewed following labs and imaging studies  CBC: Recent Labs  Lab 06/24/18 0725 06/24/18 0911 06/25/18 0623 06/28/18 2225 06/28/18 2255 06/29/18 0314  WBC 5.3 5.4 6.4 11.5*  --  13.4*  NEUTROABS  --   --   --  10.1*  --  11.5*  HGB 6.8* 7.0* 9.5* 7.8* 6.8* 7.4*  HCT 20.8* 21.4* 28.2* 24.2* 20.0* 23.4*  MCV 106.1* 105.9* 101.4* 107.1*  --  106.8*  PLT 204 210 209 354  --  161   Basic Metabolic Panel: Recent Labs  Lab 06/23/18 0857 06/24/18 0725 06/26/18 0939 06/28/18 2225 06/28/18 2255 06/29/18 0314  NA 135 138 135 130* 129* 127*  K 2.5* 4.2 4.8 3.6 3.7 3.7  CL 90* 98 95* 92* 86* 87*  CO2 33* 34* 29 29  --  30  GLUCOSE 70 148* 118* 160* 163* 182*  BUN 7* 5* 7* 15 20 18   CREATININE 0.39* 0.36* 0.35* 0.50* 0.50* 0.47*  CALCIUM 7.9* 7.5* 7.5* 6.8*  --  7.3*  MG 1.6* 1.8  --   --   --   --    GFR: Estimated Creatinine Clearance: 60.1 mL/min ([place]) (by C-G formula based on SCr of 0.47 mg/dL (L)). Liver Function Tests: Recent Labs  Lab 06/28/18 2225 06/29/18 0314  AST 28 27  ALT 26 29  ALKPHOS 86 93  BILITOT 0.7 0.4  PROT 5.5* 5.9*  ALBUMIN 1.8* 2.0*   Recent Labs  Lab  06/23/18 0857 06/29/18 1118  LIPASE 18 18   No results for input(s): AMMONIA in the last 168 hours. Coagulation Profile: Recent Labs  Lab 06/28/18 2225  INR 1.25   Cardiac Enzymes: Recent Labs  Lab 06/28/18 2225 06/29/18 0314 06/29/18 1118  TROPONINI 0.05* 0.06* 0.04*   BNP (last 3 results) No results for input(s): PROBNP in the last 8760 hours. HbA1C: No results for input(s): HGBA1C in the last 72 hours. CBG: Recent Labs  Lab 06/26/18 0026 06/26/18 0439 06/26/18 0732 06/26/18 1110 06/26/18 1623  GLUCAP 89 149* 91 127* 133*   Lipid Profile: No results for input(s): CHOL, HDL, LDLCALC, TRIG, CHOLHDL, LDLDIRECT in the last 72 hours. Thyroid Function Tests: Recent Labs    06/29/18 0314 06/29/18 0830 06/29/18 1118  TSH 17.789*  --   --   FREET4  --   --  0.97  T3FREE  --  1.2*  --    Anemia Panel: Recent Labs    06/29/18 0825 06/29/18 1118  VITAMINB12  --  929*  FOLATE 18.2  --   FERRITIN  --  611*  TIBC  --  101*  IRON  --  88   Sepsis Labs: Recent Labs  Lab 06/28/18 2256 06/29/18 0206 06/29/18 0314 06/29/18 0351 06/29/18 0825  PROCALCITON  --   --  0.52  --   --   LATICACIDVEN 2.44* 2.34*  --  1.3 2.7*    Recent Results (from the past 240 hour(s))  Blood Culture (routine x 2)     Status: None (Preliminary result)   Collection Time: 06/28/18 10:25 PM  Result Value Ref Range Status   Specimen Description BLOOD RIGHT ANTECUBITAL  Final   Special Requests   Final    BOTTLES DRAWN AEROBIC AND ANAEROBIC Blood Culture results may not be optimal due to an inadequate volume of blood received in culture bottles   Culture   Final    NO GROWTH 2 DAYS Performed at [facility] [address] [place], [place] [address]    Report Status PENDING  Incomplete  Urine culture     Status: None   Collection Time: 06/28/18 10:43 PM  Result Value Ref Range Status  Specimen Description URINE, RANDOM  Final   Special Requests NONE  Final   Culture    Final    NO GROWTH Performed at [facility], [place] [address] [place], [place] [address]    Report Status 06/30/2018 FINAL  Final  Blood Culture (routine x 2)     Status: None (Preliminary result)   Collection Time: 06/28/18 10:45 PM  Result Value Ref Range Status   Specimen Description SITE NOT SPECIFIED  Final   Special Requests   Final    BOTTLES DRAWN AEROBIC AND ANAEROBIC Blood Culture adequate volume   Culture   Final    NO GROWTH 2 DAYS Performed at [facility], [address] [place], [place] [address]    Report Status PENDING  Incomplete  MRSA PCR Screening     Status: None   Collection Time: 06/29/18  9:14 AM  Result Value Ref Range Status   MRSA by PCR NEGATIVE NEGATIVE Final    Comment:        The GeneXpert MRSA Assay (FDA approved for NASAL specimens only), is one component of Annison Birchard comprehensive MRSA colonization surveillance program. It is not intended to diagnose MRSA infection nor to guide or monitor treatment for MRSA infections. Performed at [facility], [place] [address], [place], [place] [address]   Gram stain     Status: None   Collection Time: 06/29/18 12:27 PM  Result Value Ref Range Status   Specimen Description PLEURAL RIGHT  Final   Special Requests NONE  Final   Gram Stain   Final    MODERATE WBC PRESENT,BOTH PMN AND MONONUCLEAR NO ORGANISMS SEEN Performed at [facility], [address] [place], [place] [address]    Report Status 06/29/2018 FINAL  Final  Culture, body fluid-bottle     Status: None (Preliminary result)   Collection Time: 06/29/18 12:27 PM  Result Value Ref Range Status   Specimen Description PLEURAL RIGHT  Final   Special Requests NONE  Final   Culture   Final    NO GROWTH < 24 HOURS Performed at [facility], [place] [address] [place],  [address]    Report Status PENDING  Incomplete  Expectorated sputum assessment w rflx to resp cult     Status: None (Preliminary result)   Collection  Time: 06/29/18  5:20 PM  Result Value Ref Range Status   Specimen Description EXPECTORATED SPUTUM  Final   Special Requests NONE  Final   Sputum evaluation   Final    Sputum specimen not acceptable for testing.  Please recollect.   RESULT CALLED TO, READ BACK BY AND VERIFIED WITH: S FLETCHER 06/29/18  2105 JDW Performed at [facility], [place] [address], [place],  [address]    Report Status PENDING  Incomplete         Radiology Studies: Dg Abdomen Peg Tube Location  Result Date: 06/29/2018 CLINICAL DATA:  Initial evaluation for PEG tube placement EXAM: ABDOMEN - 1 VIEW COMPARISON:  Prior radiograph from 06/22/2018 FINDINGS: Contrast material has been instilled via Vidyuth Belsito percutaneous gastrostomy tube. Contrast seen filling the gastric body and fundus. No extraluminal contrast to suggest leak or malpositioning. Nonobstructive bowel gas pattern. Large amount of retained stool within the colon, suggesting constipation. Cholecystectomy clips noted. Right greater than left bilateral pleural effusions with associated bibasilar opacities partially visualized at the lung bases. Degenerative changes noted within the lower lumbar spine. IMPRESSION: 1. Percutaneous gastrostomy tube  in place without complication. No extraluminal contrast to suggest malpositioning or leak. 2. Large volume retained stool within the colon, suggesting constipation. 3. Bilateral right greater than left pleural effusions with associated bibasilar atelectasis and/or consolidation. Electronically Signed   By: Russell Williamson M.D.   On: 06/29/2018 02:29   Dg Chest Port 1 View  Result Date: 06/29/2018 CLINICAL DATA:  Follow-up thoracentesis EXAM: PORTABLE CHEST 1 VIEW COMPARISON:  06/28/2018 FINDINGS: Reduction in right-sided pleural effusion is noted following thoracentesis. No pneumothorax is noted. Small persistent left pleural effusion is noted. Mild bibasilar atelectatic changes are noted. Cardiac shadow is stable.  IMPRESSION: No evidence of pneumothorax following right-sided thoracentesis. Bibasilar atelectasis. Electronically Signed   By: Inez Catalina M.D.   On: 06/29/2018 15:13   Dg Chest Port 1 View  Result Date: 06/28/2018 CLINICAL DATA:  Unresponsive and hypoxic. EXAM: PORTABLE CHEST 1 VIEW COMPARISON:  06/26/2018 FINDINGS: Mild cardiac enlargement. Streaky parenchymal opacities at the left lung base with air bronchograms may reflect areas of atelectasis and/or pneumonia. Moderate right and small left pleural effusions are redemonstrated slightly increased on the left. Emphysematous hyperinflation of the lungs, upper lobe predominant. No acute osseous abnormality. IMPRESSION: Moderate right and small left pleural effusions, slightly increased on the left and stable on the right since prior. Subsegmental atelectasis and/or pneumonia at the left lung base. Emphysematous hyperinflation of the lungs bilaterally. Electronically Signed   By: Ashley Royalty M.D.   On: 06/28/2018 22:44   Ir Thoracentesis Asp Pleural Space W/img Guide  Result Date: 06/29/2018 INDICATION: Patient with history of tonsillar cancer, recurrent pancreatitis, malnutrition, COPD, respiratory failure/pneumonia, bilateral pleural effusions right greater than left. Request made for diagnostic and therapeutic right thoracentesis. EXAM: ULTRASOUND GUIDED DIAGNOSTIC AND THERAPEUTIC RIGHT THORACENTESIS MEDICATIONS: None COMPLICATIONS: None immediate. PROCEDURE: An ultrasound guided thoracentesis was thoroughly discussed with the patient and questions answered. The benefits, risks, alternatives and complications were also discussed. The patient understands and wishes to proceed with the procedure. Written consent was obtained. Ultrasound was performed to localize and mark an adequate pocket of fluid in the right chest. The area was then prepped and draped in the normal sterile fashion. 1% Lidocaine was used for local anesthesia. Under ultrasound guidance Russell Williamson  6 Fr Safe-T-Centesis catheter was introduced. Thoracentesis was performed. The catheter was removed and Aziel Morgan dressing applied. FINDINGS: Rhone Ozaki total of approximately 1.5 liters of slightly hazy, yellow fluid was removed. Samples were sent to the laboratory as requested by the clinical team. IMPRESSION: Successful ultrasound guided diagnostic and therapeutic right thoracentesis yielding 1.5 liters of pleural fluid. Read by: Rowe Robert, PA-C Electronically Signed   By: Sandi Mariscal M.D.   On: 06/29/2018 12:16        Scheduled Meds: . budesonide (PULMICORT) nebulizer solution  0.5 mg Nebulization BID  . enoxaparin (LOVENOX) injection  20 mg Subcutaneous Q24H  . feeding supplement (OSMOLITE 1.5 CAL)  237 mL Per Tube TID WC & HS  . folic acid  1 mg Oral Daily  . free water  250 mL Per Tube Q8H  . ipratropium-albuterol  3 mL Nebulization Q4H  . levothyroxine  50 mcg Intravenous Daily  . lipase/protease/amylase  12,000 Units Oral TID WC & HS  . megestrol  800 mg Per Tube Daily  . pantoprazole sodium  20 mg Per Tube BID  . phosphorus  500 mg Per Tube TID WC & HS  . sucralfate  1 g Per Tube TID WC & HS   Continuous Infusions: . ceFEPime (  MAXIPIME) IV 1 g (06/29/18 2156)  . lactated ringers 75 mL/hr at 06/29/18 0840  . vancomycin 750 mg (06/29/18 2256)     LOS: 1 day    Time spent: over 24 min    Fayrene Helper, MD [facility] Pager [phone]  If 7PM-7AM, please contact night-coverage [email or web address] Password [facility] 06/30/2018, 7:41 AM

## 2018-06-30 NOTE — Evaluation (Signed)
Physical Therapy Evaluation Patient Details Name: Russell Williamson MRN: [id number] DOB: 11-13-1955 Today's Date: 06/30/2018   History of Present Illness  Pt is a 62 y/o male admitted secondary to confusion and acute respiratory failure with hypoxia. Pt also with bilat pleural effusions and is s/p R thoracentesis. PMH includes recent peg tube placement secondary to dysphagia, tonsil cancer, COPD, HTN, anemia, and recurrent pancreatitis.   Clinical Impression  Pt admitted secondary to problem above with deficits below. Pt with increased pain and refusing OOB mobility, however, was agreeable to performing supine HEP and repositioning in supine. Required mod A to roll from side to side. Pt reports he feels like he is going to need increased assist at d/c. Will continue to follow acutely to maximize functional mobility independence and safety.     Follow Up Recommendations SNF;Supervision/Assistance - 24 hour    Equipment Recommendations  Rolling walker with 5" wheels;3in1 (PT)    Recommendations for Other Services       Precautions / Restrictions Precautions Precautions: Fall Restrictions Weight Bearing Restrictions: No      Mobility  Bed Mobility Overal bed mobility: Needs Assistance Bed Mobility: Rolling Rolling: Mod assist         General bed mobility comments: Mod A for rolling side to side for positioning. Pillow placed under R hip at end of session. Pt refusing further mobility.   Transfers                 General transfer comment: Pt refusing.   Ambulation/Gait                Stairs            Wheelchair Mobility    Modified Rankin (Stroke Patients Only)       Balance                                             Pertinent Vitals/Pain Pain Assessment: 0-10 Pain Score: 9  Pain Location: abdomen, back  Pain Descriptors / Indicators: Grimacing Pain Intervention(s): Limited activity within patient's tolerance;Monitored  during session;Repositioned    Home Living Family/patient expects to be discharged to:: Skilled nursing facility                      Prior Function Level of Independence: Independent with assistive device(s)         Comments: Reports independence with ADLs and reports using cane for ambulation.      Hand Dominance   Dominant Hand: Right    Extremity/Trunk Assessment   Upper Extremity Assessment Upper Extremity Assessment: Defer to OT evaluation    Lower Extremity Assessment Lower Extremity Assessment: Generalized weakness;LLE deficits/detail LLE Deficits / Details: Noted increased weakness in LLE as compared to R. Ankle DF grossly 2-/5.     Cervical / Trunk Assessment Cervical / Trunk Assessment: Normal  Communication   Communication: No difficulties  Cognition Arousal/Alertness: Awake/alert Behavior During Therapy: Flat affect Overall Cognitive Status: No family/caregiver present to determine baseline cognitive functioning Area of Impairment: Problem solving                             Problem Solving: Slow processing;Decreased initiation;Difficulty sequencing;Requires verbal cues;Requires tactile cues General Comments: Slow, processing noted throughout session. Pt reporting somewhat conflicting information about PLOF and home information.  General Comments General comments (skin integrity, edema, etc.): No family present during session.     Exercises General Exercises - Lower Extremity Ankle Circles/Pumps: AROM;Right;AAROM;Left;10 reps;Supine Quad Sets: AROM;Both;10 reps Heel Slides: AAROM;Both;5 reps Hip ABduction/ADduction: AAROM;Both;5 reps   Assessment/Plan    PT Assessment Patient needs continued PT services  PT Problem List Decreased strength;Decreased range of motion;Decreased activity tolerance;Decreased balance;Decreased mobility;Decreased knowledge of use of DME;Pain       PT Treatment Interventions DME  instruction;Gait training;Stair training;Functional mobility training;Therapeutic activities;Therapeutic exercise;Balance training;Patient/family education    PT Goals (Current goals can be found in the Care Plan section)  Acute Rehab PT Goals Patient Stated Goal: to feel better  PT Goal Formulation: With patient Time For Goal Achievement: 07/14/18 Potential to Achieve Goals: Fair    Frequency Min 2X/week   Barriers to discharge Decreased caregiver support      Co-evaluation               AM-PAC PT "6 Clicks" Daily Activity  Outcome Measure Difficulty turning over in bed (including adjusting bedclothes, sheets and blankets)?: Unable Difficulty moving from lying on back to sitting on the side of the bed? : Unable Difficulty sitting down on and standing up from a chair with arms (e.g., wheelchair, bedside commode, etc,.)?: Unable Help needed moving to and from a bed to chair (including a wheelchair)?: A Lot Help needed walking in hospital room?: Total Help needed climbing 3-5 steps with a railing? : Total 6 Click Score: 7    End of Session   Activity Tolerance: Patient limited by pain;Patient limited by fatigue Patient left: in bed;with call bell/phone within reach;with bed alarm set Nurse Communication: Mobility status PT Visit Diagnosis: Other abnormalities of gait and mobility (R26.89);Unsteadiness on feet (R26.81);Muscle weakness (generalized) (M62.81);Pain Pain - part of body: (abdomen, back )    Time: 9528-4132 PT Time Calculation (min) (ACUTE ONLY): 16 min   Charges:   PT Evaluation $PT Eval Moderate Complexity: Olivia, PT, DPT  Acute Rehabilitation Services  Pager: [phone] Office: [phone]   Rudean Hitt 06/30/2018, 1:11 PM

## 2018-06-30 NOTE — Progress Notes (Signed)
Spoke with Antony Haste RN from the IV team re: Picc placement. Told they had a list of pts requiring piccs and they would do their best to have it done today. No promises

## 2018-06-30 NOTE — Progress Notes (Signed)
Spoke with NP Bodenheimer. Lab per Angela Nevin has been unable to get blood for lactic acid.Per lab Angela Nevin patient has been stuck several times trying to get blood work and per there policy unable  to try again until tomorrow.

## 2018-06-30 NOTE — Progress Notes (Signed)
At bedside to place PICC line and was unable to get catheter to pass further than the subclavian vein. Attempted twice both the left basilic vein and the brachial vein. Accessed both veins with ease and threaded  guide wire , however the picc would kink and curl up at the subclavian vein. Would recommend central line placement to avoid catheter traveling from peripherally . Staff RN aware.

## 2018-06-30 NOTE — Progress Notes (Signed)
Initial Nutrition Assessment  DOCUMENTATION CODES:   Severe malnutrition in context of chronic illness, Underweight  INTERVENTION:   Tube Feeding:  Continue Osmolite 1.5 237 mL (1 can) 4 times daily Add Pro-Stat 30 mL daily Provides 1520 kcals, 75 g of protein and 724 L of free water  Add Juven BID per tube, each packet provides 80 calories, 8 grams of carbohydrate, 2.5  grams of protein (collagen), 7 grams of L-arginine and 7 grams of L-glutamine; supplement contains CaHMB, Vitamins C, E, B12 and Zinc to promote wound healing  Administer Creon prior to  bolus feedings, flush tube with plenty of water and then mix contents of  Creon capsule with apple juice or applesauce prior to administration  Recommend checking magnesium and phosphorus and supplementing if needed  NUTRITION DIAGNOSIS:   Severe Malnutrition related to chronic illness(chronic pancreatitis on PERT, COPD, hx of tonsillar cancer with ongoing dysphagia) as evidenced by severe fat depletion, severe muscle depletion.  GOAL:   Patient will meet greater than or equal to 90% of their needs   MONITOR:   TF tolerance, Labs, Weight trends, Skin  REASON FOR ASSESSMENT:   Consult Enteral/tube feeding initiation and management  ASSESSMENT:   62 yo male admitted with acute on chronic respiratory failure and acute metabolic encephalopathy with HCAP, b/l pleural effusions. PMH includes chronic pancreatitis on PERT, hx of tonsillar cancer s/p radiation, COPD, HTN, GERD, FTT , dysphagia and malnutrition with G-tube placement last admission (just discharged on 9/23)  9/25 DG abdomen confirmed G-tube in stomach, also noted large amount of stool in colon 9/26 Thoracentesis with 1.5 L removed  Pt alert, very fragile appearing.  Tolerated Osmolite 1.5 237 mL bolus this AM; order for Osmolite 1.5 237 mL 4 times daily ordered. This was TF prescription at discharge on 06/2398 NPO at present, pt on Dysphagia I, Honey Thick diet per  SLP on last admission and eating minimally  Creon being given via G-tube (not ideal) as pt unable to take meds by mouth at present. G-tube use of Creon is off label. Discussed importance of administering with plenty of free water before and after administration to prevent clogging. Also discussed importance of mixing contents of Creon capsule in apple juice or applesauce prior to administration with RN  +liquid stool per RN; noted significant stool in colon on xray on admission.  Ensure that Creon is being given WITH bolus feedings and as ordered; May need to consider increasing Creon dosage with feedings if significant diarrhea present   Labs: sodium 127, TSH 17.8 Meds: Creon with meals and bedtime, megace, carafate, synthroid  NUTRITION - FOCUSED PHYSICAL EXAM:    Most Recent Value  Orbital Region  Severe depletion  Upper Arm Region  Severe depletion  Thoracic and Lumbar Region  Severe depletion  Buccal Region  Severe depletion  Temple Region  Severe depletion  Clavicle Bone Region  Severe depletion  Clavicle and Acromion Bone Region  Severe depletion  Scapular Bone Region  Severe depletion  Dorsal Hand  Severe depletion  Patellar Region  Severe depletion  Anterior Thigh Region  Severe depletion  Posterior Calf Region  Severe depletion  Edema (RD Assessment)  None       Diet Order:   Diet Order            Diet NPO time specified  Diet effective now              EDUCATION NEEDS:   Not appropriate for education at this  time  Skin:  Skin Assessment: Skin Integrity Issues: Skin Integrity Issues:: Other (Comment) DTI: bilateral heels Unstageable: sacrum Other: MASD: buttock  Last BM:  9/27  Height:   Ht Readings from Last 1 Encounters:  06/29/18 5\' 5"  (1.651 m)    Weight:   Wt Readings from Last 1 Encounters:  06/30/18 44.4 kg    Ideal Body Weight:  61.8 kg  BMI:  Body mass index is 16.29 kg/m.  Estimated Nutritional Needs:   Kcal:  1500-1700 kcals    Protein:  75-85 g  Fluid:  >/= 1.5 L   Kerman Passey MS, RD, LDN, CNSC [phone] Pager  [phone] Weekend/On-Call Pager

## 2018-06-30 NOTE — Progress Notes (Addendum)
CSW received call from Palliative Medicine NP this afternoon about patient being agreeable to SNF placement, realizing that he needs additional assistance and is also interested in hospice. Per chart review, patient recent readmit, last full assessment completed on 06/13/18 (see below). CSW to fax out referral and follow up with patient on bed offers.  Laveda Abbe, [place] Clinical Social Worker [phone]     Clinical Social Work Assessment  Patient Details  Name: Russell Williamson MRN: [id number] Date of Birth: Mar 02, 1956  Date of referral:  06/13/18               Reason for consult:  Discharge Planning, Facility Placement                    Permission sought to share information with:  Family Supports Permission granted to share information::                Name::     Roanna Raider, [phone]             Agency::                Relationship::  niece             Contact Information:  [phone]  Housing/Transportation Living arrangements for the past 2 months:  Single Family Home Source of Information:  Patient Patient Interpreter Needed:  None Criminal Activity/Legal Involvement Pertinent to Current Situation/Hospitalization:  No - Comment as needed Significant Relationships:  Siblings, Other Family Members Lives with:  Relatives Do you feel safe going back to the place where you live?  Yes Need for family participation in patient care:  Yes (Comment)  Care giving concerns:  No family at bedside. patient stated he lives at home with his niece and 2 great nieces. Patient stated that his niece went back to school so she is unable to provide him 24 hours care at home.   Social Worker assessment / plan:  Patient stated he receives support from his niece and siblings in the area. CSW spoke with patient about going to rehab. Patient stated he is not interested in going to a SNF. Patient stated he receives disability and patient stated if he goes to SNF his money would  be taken away by the facility. CSW ask patient if he would be interested in receiving home health. Patient stated he will think about it and let CSW know.  Employment status:  Disabled (Comment on whether or not currently receiving Disability) Insurance information:  Medicaid In [place] PT Recommendations:  [place] / Referral to community resources:  [place]  Patient/Family's Response to care:  Patient appreciate CSW role in care  Patient/Family's Understanding of and Emotional Response to Diagnosis, Current Treatment, and Prognosis:  Patient is refusing SNF.  Emotional Assessment Appearance:  Appears older than stated age Attitude/Demeanor/Rapport:  Engaged Affect (typically observed):  Accepting Orientation:  Oriented to Self, Oriented to Situation, Oriented to Place, Oriented to  Time Alcohol / Substance use:  Tobacco Use, Illicit Drugs(pt no longer uses) Psych involvement (Current and /or in the community):  No (Comment)  Discharge Needs  Concerns to be addressed:  Care Coordination Readmission within the last 30 days:  No Current discharge risk:  Dependent with Mobility Barriers to Discharge:  Continued Medical Work up   [employer], LCSW 06/13/2018, 3:04 PM

## 2018-07-01 DIAGNOSIS — D531 Other megaloblastic anemias, not elsewhere classified: Secondary | ICD-10-CM

## 2018-07-01 DIAGNOSIS — D638 Anemia in other chronic diseases classified elsewhere: Secondary | ICD-10-CM

## 2018-07-01 DIAGNOSIS — J449 Chronic obstructive pulmonary disease, unspecified: Secondary | ICD-10-CM

## 2018-07-01 DIAGNOSIS — J189 Pneumonia, unspecified organism: Secondary | ICD-10-CM

## 2018-07-01 LAB — BASIC METABOLIC PANEL
ANION GAP: 8 (ref 5–15)
BUN: 8 mg/dL (ref 8–23)
CALCIUM: 6.6 mg/dL — AB (ref 8.9–10.3)
CO2: 32 mmol/L (ref 22–32)
Chloride: 91 mmol/L — ABNORMAL LOW (ref 98–111)
Creatinine, Ser: <0.3 mg/dL — ABNORMAL LOW (ref 0.61–1.24)
Glucose, Bld: 81 mg/dL (ref 70–99)
POTASSIUM: 2.8 mmol/L — AB (ref 3.5–5.1)
SODIUM: 131 mmol/L — AB (ref 135–145)

## 2018-07-01 LAB — CBC
HCT: 16.3 % — ABNORMAL LOW (ref 39.0–52.0)
Hemoglobin: 5.6 g/dL — CL (ref 13.0–17.0)
MCH: 35 pg — AB (ref 26.0–34.0)
MCHC: 34.4 g/dL (ref 30.0–36.0)
MCV: 101.9 fL — ABNORMAL HIGH (ref 78.0–100.0)
PLATELETS: 261 10*3/uL (ref 150–400)
RBC: 1.6 MIL/uL — AB (ref 4.22–5.81)
RDW: 14.6 % (ref 11.5–15.5)
WBC: 8.7 10*3/uL (ref 4.0–10.5)

## 2018-07-01 LAB — MAGNESIUM: Magnesium: 1 mg/dL — ABNORMAL LOW (ref 1.7–2.4)

## 2018-07-01 LAB — PREPARE RBC (CROSSMATCH)

## 2018-07-01 LAB — PHOSPHORUS: PHOSPHORUS: 3.1 mg/dL (ref 2.5–4.6)

## 2018-07-01 LAB — HEMOGLOBIN AND HEMATOCRIT, BLOOD
HCT: 30.6 % — ABNORMAL LOW (ref 39.0–52.0)
HEMOGLOBIN: 10.4 g/dL — AB (ref 13.0–17.0)

## 2018-07-01 MED ORDER — LOPERAMIDE HCL 2 MG PO CAPS: 4.0000 mg | ORAL_CAPSULE | Freq: Once | ORAL | Status: DC

## 2018-07-01 MED ORDER — MAGNESIUM SULFATE 2 GM/50ML IV SOLN
2.0000 g | Freq: Once | INTRAVENOUS | Status: AC
  Administered 2018-07-01: 2 g via INTRAVENOUS
  Filled 2018-07-01: qty 50

## 2018-07-01 MED ORDER — POTASSIUM CHLORIDE 20 MEQ/15ML (10%) PO SOLN
40.0000 meq | Freq: Once | ORAL | Status: AC
  Administered 2018-07-01: 40 meq via ORAL
  Filled 2018-07-01: qty 30

## 2018-07-01 MED ORDER — POTASSIUM CHLORIDE CRYS ER 20 MEQ PO TBCR
40.0000 meq | EXTENDED_RELEASE_TABLET | ORAL | Status: DC
  Administered 2018-07-01: 40 meq via ORAL
  Filled 2018-07-01 (×2): qty 2

## 2018-07-01 MED ORDER — OXYCODONE HCL 5 MG PO TABS
10.0000 mg | ORAL_TABLET | ORAL | Status: DC | PRN
  Administered 2018-07-01 – 2018-07-02 (×3): 10 mg
  Filled 2018-07-01 (×4): qty 2

## 2018-07-01 MED ORDER — OXYCODONE HCL 5 MG PO TABS
10.0000 mg | ORAL_TABLET | ORAL | Status: DC | PRN
  Administered 2018-07-01: 10 mg
  Filled 2018-07-01: qty 2

## 2018-07-01 MED ORDER — MORPHINE SULFATE (PF) 2 MG/ML IV SOLN
2.0000 mg | INTRAVENOUS | Status: DC | PRN
  Administered 2018-07-01 – 2018-07-03 (×13): 2 mg via INTRAVENOUS
  Filled 2018-07-01 (×13): qty 1

## 2018-07-01 MED ORDER — SODIUM CHLORIDE 0.9% IV SOLUTION
Freq: Once | INTRAVENOUS | Status: AC
  Administered 2018-07-01: 12:00:00 via INTRAVENOUS

## 2018-07-01 MED ORDER — POTASSIUM CHLORIDE 20 MEQ/15ML (10%) PO SOLN: 40.0000 meq | ORAL | Status: DC

## 2018-07-01 NOTE — Discharge Summary (Addendum)
Discharge Summary  Russell Williamson WJX:[id number] DOB: October 24, 1955  PCP: Harvie Junior, MD  Admit date: 06/09/2018 Discharge date: 06/26/2018   Time spent: < 25 minutes  Admitted From: home Disposition:  home  Recommendations for Outpatient Follow-up:  1. Follow up with PCP in 1-2 weeks 2. Repeat TSH in 2-4 weeks  3. Outpatient Palliative followup for dysphagia, cachexia    Discharge Diagnoses:  Active Hospital Problems   Diagnosis Date Noted  . Hypoglycemia 06/09/2018  . Failure to thrive (0-17)   . Advance care planning   . Abdominal pain 06/10/2018  . Acute metabolic encephalopathy 21/30/8657  . Aspiration pneumonia ([place]) 06/09/2018  . Lobar pneumonia ([place]) 06/09/2018  . Abnormal LFTs 06/09/2018  . Sepsis ([place])   . COPD with chronic bronchitis ([place]) 02/06/2018  . Tobacco abuse 02/06/2018  . Protein-calorie malnutrition, severe 07/03/2017  . Chronic alcoholic pancreatitis ([place]) 09/01/2014  . Pancytopenia ([place]) 02/28/2014  . HTN (hypertension) 10/16/2011    Resolved Hospital Problems  No resolved problems to display.    Discharge Condition: Stable   CODE STATUS:FULL Diet recommendation:  Dysphagia 1 diet  History of present illness:  Russell Williamson is a 62 y.o. year old male with medical history significant for  HTN, COPD, depression, chronic pancreatitis, prostate cancer status post radiation therapy and currently in remission  who presented on 06/09/2018 with altered mental status, cough, shortness of breath and abdominal pain was found to have sepsis from aspiration pneumonia. Hospital course complicated by persistent hypoglycemia requring D5 infusion, dysphagia/cachexia requiring PEG tube for supplemental intake, and severe hypothyrodism.. Remaining hospital course addressed in problem based format below:   Hospital Course:   Sepsis secondary to aspiration pneumonia.  Completed 7-day course of Unasyn and was able to wean to home regimen supplementall  oxygen (3.5 L).  Repeat chest x-ray showed persistent opacities with signs of atelectasis which patient was encouraged to use incentive spirometry.  Dysphagia and severe cachexia (in the setting of chronic pancreatitis).  Patient underwent PEG tube placement on 9/16 by IR.  Patient did well with continuous tube feeds and will continue on bolus feeding regimen on discharge.  Patient was unable to tolerate dysphagia 1 diet.  Chronic pancreatitis with cachexia and severe protein calorie malnutrition.  Chronic abdominal pain remains stable.  Patient was continued on lipase supplementation and Megace for appetite stimulation and PEG tube as mentioned above. Palliative care was consulted he wished to continue full scope of medical care. PT recommended SNF given severe weakness and deconditioning but patient declined and wished to go home with family for care.   Hypokalemia and hypomagnesemia, resolved.  Improved with combined IV and oral repletion.  Hypoglycemia.  Unclear etiology likely related to diminished appetite.  Briefly required support with D5 infusion; however, was able to maintain normal glycemia once 2 beats were initiated.  Severe hypothyroidism.  TSH initially 21 down to 11 during hospital stay once initiated on levothyroxine.  Will need repeat TSH as outpatient.     Consultations:  Palliative, IR  Procedures/Studies: PEG tube placement  Discharge Exam: BP (!) 111/91 (BP Location: Right Arm)   Pulse 87   Temp 97.6 F (36.4 C) (Skin)   Resp 18   Ht 5\' 5"  (1.651 m)   Wt 36.9 kg   SpO2 95%   BMI 13.54 kg/m   General: cachetic, frail, no distress Eyes: EOMI, anicteric Cardiovascular: regular rate and rhythm, no murmurs, rubs or gallops, no edema, Respiratory: Normal respiratory effort on 4 L, crackles at  bases, decreased breath sound in all lung fields Abdomen: soft, non-distended, PEG tube in place , c/d/i Skin: No Rash Neurologic: Grossly no focal neuro deficit.Mental  status AAOx3, speech normal, Psychiatric:Appropriate affect, and mood   Discharge Instructions You were cared for by a hospitalist during your hospital stay. If you have any questions about your discharge medications or the care you received while you were in the hospital after you are discharged, you can call the unit and asked to speak with the hospitalist on call if the hospitalist that took care of you is not available. Once you are discharged, your primary care physician will handle any further medical issues. Please note that NO REFILLS for any discharge medications will be authorized once you are discharged, as it is imperative that you return to your primary care physician (or establish a relationship with a primary care physician if you do not have one) for your aftercare needs so that they can reassess your need for medications and monitor your lab values.  Discharge Instructions    Diet - low sodium heart healthy   Complete by:  As directed    Increase activity slowly   Complete by:  As directed      Allergies as of 06/26/2018   No Known Allergies     Medication List    STOP taking these medications   amoxicillin-clavulanate 875-125 MG tablet Commonly known as:  AUGMENTIN   pantoprazole 20 MG tablet Commonly known as:  PROTONIX Replaced by:  pantoprazole sodium 40 mg/20 mL Pack   temazepam 30 MG capsule Commonly known as:  RESTORIL     TAKE these medications   amLODipine 2.5 MG tablet Commonly known as:  NORVASC Take 2.5 mg by mouth daily.   collagenase ointment Commonly known as:  SANTYL Apply topically daily.   feeding supplement (ENSURE ENLIVE) Liqd Take 237 mLs by mouth 3 (three) times daily between meals. What changed:    how much to take  additional instructions   feeding supplement (PRO-STAT SUGAR FREE 64) Liqd Place 30 mLs into feeding tube daily.   folic acid 1 MG tablet Commonly known as:  FOLVITE Take 1 mg by mouth daily.   free water  Soln Place 250 mLs into feeding tube every 8 (eight) hours.   guaiFENesin-dextromethorphan 100-10 MG/5ML syrup Commonly known as:  ROBITUSSIN DM Take 15 mLs by mouth every 6 (six) hours as needed for cough.   levothyroxine 100 MCG tablet Commonly known as:  SYNTHROID, LEVOTHROID Place 1 tablet (100 mcg total) into feeding tube daily before breakfast.   lipase/protease/amylase 12000 units Cpep capsule Commonly known as:  CREON Take 1 capsule (12,000 Units total) by mouth 4 (four) times daily. Ok to open capsule and mix with applesauce What changed:  additional instructions   magnesium oxide 400 (241.3 Mg) MG tablet Commonly known as:  MAG-OX Place 2 tablets (800 mg total) into feeding tube 2 (two) times daily.   megestrol 400 MG/10ML suspension Commonly known as:  MEGACE Place 20 mLs (800 mg total) into feeding tube daily.   mirtazapine 30 MG tablet Commonly known as:  REMERON Take 30 mg by mouth at bedtime.   multivitamin with minerals Tabs tablet Place 1 tablet into feeding tube daily.   nicotine 21 mg/24hr patch Commonly known as:  NICODERM CQ - dosed in mg/24 hours Place 1 patch (21 mg total) onto the skin daily.   oxyCODONE 15 MG immediate release tablet Commonly known as:  ROXICODONE Take 1 tablet (  15 mg total) by mouth every 8 (eight) hours as needed for pain. What changed:  when to take this   OXYGEN Inhale 3 L into the lungs as needed (shortness of breath).   pantoprazole sodium 40 mg/20 mL Pack Commonly known as:  PROTONIX Place 10 mLs (20 mg total) into feeding tube 2 (two) times daily. Replaces:  pantoprazole 20 MG tablet   phosphorus 155-852-130 MG tablet Commonly known as:  K PHOS NEUTRAL Place 2 tablets (500 mg total) into feeding tube 4 (four) times daily -  with meals and at bedtime.   PROAIR HFA 108 (90 Base) MCG/ACT inhaler Generic drug:  albuterol Inhale 2 puffs into the lungs every 4 (four) hours as needed for wheezing or shortness of  breath.   promethazine 25 MG tablet Commonly known as:  PHENERGAN Take 1 tablet (25 mg total) by mouth every 6 (six) hours as needed for nausea or vomiting.   [place] Add to liquids to make nectar thick consistency for dysphagia   sucralfate 1 GM/10ML suspension Commonly known as:  CARAFATE Place 10 mLs (1 g total) into feeding tube 4 (four) times daily -  with meals and at bedtime. What changed:  how to take this   traMADol 50 MG tablet Commonly known as:  ULTRAM Place 1 tablet (50 mg total) into feeding tube every 6 (six) hours as needed for up to 5 days for moderate pain.      No Known Allergies    The results of significant diagnostics from this hospitalization (including imaging, microbiology, ancillary and laboratory) are listed below for reference.    Significant Diagnostic Studies: Ir Gastrostomy Tube Mod Sed  Result Date: 06/19/2018 INDICATION: 62 year old male with failure to thrive. Request for percutaneous gastrostomy tube placement EXAM: PERCUTANEOUS GASTROSTOMY TUBE WITH FLUOROSCOPIC GUIDANCE Physician: Stephan Minister. Anselm Pancoast, MD MEDICATIONS: Inpatient receiving scheduled IV antibiotics.  Glucagon 1 mg IV ANESTHESIA/SEDATION: Versed 1 mg IV; Fentanyl 25 mcg IV Moderate Sedation Time:  26 minutes The patient was continuously monitored during the procedure by the interventional radiology nurse under my direct supervision. FLUOROSCOPY TIME:  Fluoroscopy Time: 6 minutes and 42 seconds, 14 mGy COMPLICATIONS: None immediate. PROCEDURE: Informed consent was obtained for a percutaneous gastrostomy tube. The patient was placed on the interventional table. An orogastric tube was placed with fluoroscopic guidance. The anterior abdomen was prepped and draped in sterile fashion. Maximal barrier sterile technique was utilized including caps, mask, sterile gowns, sterile gloves, sterile drape, hand hygiene and skin antiseptic. Stomach was inflated with air through the  orogastric tube. The skin and subcutaneous tissues were anesthetized with 1% lidocaine. A 17 gauge needle was directed into the distended stomach with fluoroscopic guidance. Needle placement was difficult due to the shape of the stomach. Placement within the stomach was confirmed using a lateral projection and contrast injection. A wire was advanced into the stomach. A 9-French vascular sheath was placed and the snare device went directly into the esophagus and advanced to the patient's mouth. This snare device was pulled out of the patient's mouth. The snare device was connected to a 20-French gastrostomy tube. The snare device and gastrostomy tube were pulled through the patient's mouth and out the anterior abdominal wall. The gastrostomy tube was cut to an appropriate length. Contrast injection through gastrostomy tube confirmed placement within the stomach. Fluoroscopic images were obtained for documentation. The gastrostomy tube was flushed with normal saline. IMPRESSION: Successful fluoroscopic guided percutaneous gastrostomy tube placement. Electronically Signed  By: Markus Daft M.D.   On: 06/19/2018 11:42   Dg Abdomen Peg Tube Location  Result Date: 06/29/2018 CLINICAL DATA:  Initial evaluation for PEG tube placement EXAM: ABDOMEN - 1 VIEW COMPARISON:  Prior radiograph from 06/22/2018 FINDINGS: Contrast material has been instilled via a percutaneous gastrostomy tube. Contrast seen filling the gastric body and fundus. No extraluminal contrast to suggest leak or malpositioning. Nonobstructive bowel gas pattern. Large amount of retained stool within the colon, suggesting constipation. Cholecystectomy clips noted. Right greater than left bilateral pleural effusions with associated bibasilar opacities partially visualized at the lung bases. Degenerative changes noted within the lower lumbar spine. IMPRESSION: 1. Percutaneous gastrostomy tube in place without complication. No extraluminal contrast to suggest  malpositioning or leak. 2. Large volume retained stool within the colon, suggesting constipation. 3. Bilateral right greater than left pleural effusions with associated bibasilar atelectasis and/or consolidation. Electronically Signed   By: Jeannine Boga M.D.   On: 06/29/2018 02:29   Dg Chest Port 1 View  Result Date: 06/29/2018 CLINICAL DATA:  Follow-up thoracentesis EXAM: PORTABLE CHEST 1 VIEW COMPARISON:  06/28/2018 FINDINGS: Reduction in right-sided pleural effusion is noted following thoracentesis. No pneumothorax is noted. Small persistent left pleural effusion is noted. Mild bibasilar atelectatic changes are noted. Cardiac shadow is stable. IMPRESSION: No evidence of pneumothorax following right-sided thoracentesis. Bibasilar atelectasis. Electronically Signed   By: Inez Catalina M.D.   On: 06/29/2018 15:13   Dg Chest Port 1 View  Result Date: 06/28/2018 CLINICAL DATA:  Unresponsive and hypoxic. EXAM: PORTABLE CHEST 1 VIEW COMPARISON:  06/26/2018 FINDINGS: Mild cardiac enlargement. Streaky parenchymal opacities at the left lung base with air bronchograms may reflect areas of atelectasis and/or pneumonia. Moderate right and small left pleural effusions are redemonstrated slightly increased on the left. Emphysematous hyperinflation of the lungs, upper lobe predominant. No acute osseous abnormality. IMPRESSION: Moderate right and small left pleural effusions, slightly increased on the left and stable on the right since prior. Subsegmental atelectasis and/or pneumonia at the left lung base. Emphysematous hyperinflation of the lungs bilaterally. Electronically Signed   By: Ashley Royalty M.D.   On: 06/28/2018 22:44   Dg Chest Port 1 View  Result Date: 06/26/2018 CLINICAL DATA:  Dyspnea COPD (chronic obstructive pulmonary disease,cough,congestion EXAM: PORTABLE CHEST 1 VIEW COMPARISON:  06/21/2018 FINDINGS: Lungs are hyperinflated. There is persistent opacity in the MEDIAL LEFT lung base and at the  RIGHT lung base. Opacity on the RIGHT obscures the hemidiaphragm. There are bilateral pleural effusions, RIGHT greater than LEFT. Effusion appears increased on the RIGHT since previous exam. IMPRESSION: Increased RIGHT pleural effusion.  Persistent bibasilar opacities. Electronically Signed   By: Nolon Nations M.D.   On: 06/26/2018 13:01   Dg Chest Port 1 View  Result Date: 06/21/2018 CLINICAL DATA:  Cough, COPD, follow-up pneumonia EXAM: PORTABLE CHEST 1 VIEW COMPARISON:  06/09/2018 chest radiograph. FINDINGS: Stable cardiomediastinal silhouette with normal heart size. No pneumothorax. Small to moderate right pleural effusion, increased. Stable trace left pleural effusion. Emphysema. No pulmonary edema. Patchy opacities at the right greater than left lung bases, worsened on the right and stable on the left. IMPRESSION: 1. Small to moderate right pleural effusion, increased. Trace left pleural effusion, stable. 2. Patchy opacities at the right greater than left lung bases, worsened on the right and stable on the left, which could represent atelectasis, aspiration or pneumonia. Follow-up chest radiographs to resolution advised. 3. Emphysema. Electronically Signed   By: Ilona Sorrel M.D.   On: 06/21/2018 10:17  Dg Chest Port 1 View  Result Date: 06/09/2018 CLINICAL DATA:  Found unconscious. Hypoglycemic. Altered mental status. EXAM: PORTABLE CHEST 1 VIEW COMPARISON:  Two-view chest x-ray 05/26/2018 FINDINGS: Heart size normal. Asymmetric right lower lobe airspace disease is present. There is minimal atelectasis at the left base. A lateral right pleural effusion is noted. IMPRESSION: 1. Asymmetric right lower lobe airspace disease and effusion consistent with lobar pneumonia. Aspiration is considered less likely. 2. Minimal airspace disease the left base likely reflects atelectasis. Electronically Signed   By: San Morelle M.D.   On: 06/09/2018 17:14   Dg Abd 2 Views  Result Date:  06/22/2018 CLINICAL DATA:  Abdominal distension EXAM: ABDOMEN - 2 VIEW COMPARISON:  CT abdomen and pelvis May 30, 2018. FINDINGS: Supine and upright images obtained. Gastrostomy catheter is positioned in the left upper abdomen. There is no appreciable bowel dilatation or air-fluid level to suggest bowel obstruction. No free air. There are surgical clips in the gallbladder fossa region. There is a right pleural effusion with right base consolidation. There are foci of pancreatic calcification evident. IMPRESSION: Gastrostomy catheter on the left. Evidence suggesting a degree of chronic pancreatitis. No bowel obstruction or free air evident. Right pleural effusion with right base consolidation noted. Electronically Signed   By: Lowella Grip III M.D.   On: 06/22/2018 15:29   [place] Ekg Site Rite  Result Date: 06/30/2018 If Site Rite image not attached, placement could not be confirmed due to current cardiac rhythm.  Ir Thoracentesis Asp Pleural Space W/img Guide  Result Date: 06/29/2018 INDICATION: Patient with history of tonsillar cancer, recurrent pancreatitis, malnutrition, COPD, respiratory failure/pneumonia, bilateral pleural effusions right greater than left. Request made for diagnostic and therapeutic right thoracentesis. EXAM: ULTRASOUND GUIDED DIAGNOSTIC AND THERAPEUTIC RIGHT THORACENTESIS MEDICATIONS: None COMPLICATIONS: None immediate. PROCEDURE: An ultrasound guided thoracentesis was thoroughly discussed with the patient and questions answered. The benefits, risks, alternatives and complications were also discussed. The patient understands and wishes to proceed with the procedure. Written consent was obtained. Ultrasound was performed to localize and mark an adequate pocket of fluid in the right chest. The area was then prepped and draped in the normal sterile fashion. 1% Lidocaine was used for local anesthesia. Under ultrasound guidance a 6 Fr Safe-T-Centesis catheter was introduced.  Thoracentesis was performed. The catheter was removed and a dressing applied. FINDINGS: A total of approximately 1.5 liters of slightly hazy, yellow fluid was removed. Samples were sent to the laboratory as requested by the clinical team. IMPRESSION: Successful ultrasound guided diagnostic and therapeutic right thoracentesis yielding 1.5 liters of pleural fluid. Read by: Rowe Robert, PA-C Electronically Signed   By: Sandi Mariscal M.D.   On: 06/29/2018 12:16    Microbiology: Recent Results (from the past 240 hour(s))  Blood Culture (routine x 2)     Status: None (Preliminary result)   Collection Time: 06/28/18 10:25 PM  Result Value Ref Range Status   Specimen Description BLOOD RIGHT ANTECUBITAL  Final   Special Requests   Final    BOTTLES DRAWN AEROBIC AND ANAEROBIC Blood Culture results may not be optimal due to an inadequate volume of blood received in culture bottles   Culture   Final    NO GROWTH 3 DAYS Performed at [facility], [place] [address], [place], [place] [address]    Report Status PENDING  Incomplete  Urine culture     Status: None   Collection Time: 06/28/18 10:43 PM  Result Value Ref Range Status  Specimen Description URINE, RANDOM  Final   Special Requests NONE  Final   Culture   Final    NO GROWTH Performed at [facility], [place] [address] [place], [place] [address]    Report Status 06/30/2018 FINAL  Final  Blood Culture (routine x 2)     Status: None (Preliminary result)   Collection Time: 06/28/18 10:45 PM  Result Value Ref Range Status   Specimen Description SITE NOT SPECIFIED  Final   Special Requests   Final    BOTTLES DRAWN AEROBIC AND ANAEROBIC Blood Culture adequate volume   Culture   Final    NO GROWTH 3 DAYS Performed at [facility], [address], [place], [place] [address]    Report Status PENDING  Incomplete  MRSA PCR Screening     Status: None   Collection Time: 06/29/18  9:14 AM  Result Value Ref Range Status   MRSA by PCR  NEGATIVE NEGATIVE Final    Comment:        The GeneXpert MRSA Assay (FDA approved for NASAL specimens only), is one component of a comprehensive MRSA colonization surveillance program. It is not intended to diagnose MRSA infection nor to guide or monitor treatment for MRSA infections. Performed at [facility], [place] [address], [place], [place] [address]   Gram stain     Status: None   Collection Time: 06/29/18 12:27 PM  Result Value Ref Range Status   Specimen Description PLEURAL RIGHT  Final   Special Requests NONE  Final   Gram Stain   Final    MODERATE WBC PRESENT,BOTH PMN AND MONONUCLEAR NO ORGANISMS SEEN Performed at [facility], [address] [place], [place] [address]    Report Status 06/29/2018 FINAL  Final  Culture, body fluid-bottle     Status: None (Preliminary result)   Collection Time: 06/29/18 12:27 PM  Result Value Ref Range Status   Specimen Description PLEURAL RIGHT  Final   Special Requests NONE  Final   Culture   Final    NO GROWTH 2 DAYS Performed at [place] [address] [place], [place] [address]    Report Status PENDING  Incomplete  Expectorated sputum assessment w rflx to resp cult     Status: None   Collection Time: 06/29/18  5:20 PM  Result Value Ref Range Status   Specimen Description EXPECTORATED SPUTUM  Final   Special Requests NONE  Final   Sputum evaluation   Final    Sputum specimen not acceptable for testing.  Please recollect.   RESULT CALLED TO, READ BACK BY AND VERIFIED WITH: S FLETCHER 06/29/18  2105 JDW Performed at [facility], [place] [address], [place], [place] [address]    Report Status 06/30/2018 FINAL  Final     Labs: Basic Metabolic Panel: Recent Labs  Lab 06/26/18 0939 06/28/18 2225 06/28/18 2255 06/29/18 0314 07/01/18 0636  NA 135 130* 129* 127* 131*  K 4.8 3.6 3.7 3.7 2.8*  CL 95* 92* 86* 87* 91*  CO2 29 29  --  30 32  GLUCOSE 118* 160* 163* 182* 81  BUN 7* 15 20 18 8    CREATININE 0.35* 0.50* 0.50* 0.47* <0.30*  CALCIUM 7.5* 6.8*  --  7.3* 6.6*  MG  --   --   --   --  1.0*  PHOS  --   --   --   --  3.1   Liver  Function Tests: Recent Labs  Lab 06/28/18 2225 06/29/18 0314  AST 28 27  ALT 26 29  ALKPHOS 86 93  BILITOT 0.7 0.4  PROT 5.5* 5.9*  ALBUMIN 1.8* 2.0*   Recent Labs  Lab 06/29/18 1118  LIPASE 18   No results for input(s): AMMONIA in the last 168 hours. CBC: Recent Labs  Lab 06/25/18 0623 06/28/18 2225 06/28/18 2255 06/29/18 0314 07/01/18 0636 07/01/18 1904  WBC 6.4 11.5*  --  13.4* 8.7  --   NEUTROABS  --  10.1*  --  11.5*  --   --   HGB 9.5* 7.8* 6.8* 7.4* 5.6* 10.4*  HCT 28.2* 24.2* 20.0* 23.4* 16.3* 30.6*  MCV 101.4* 107.1*  --  106.8* 101.9*  --   PLT 209 354  --  381 261  --    Cardiac Enzymes: Recent Labs  Lab 06/28/18 2225 06/29/18 0314 06/29/18 1118  TROPONINI 0.05* 0.06* 0.04*   BNP: BNP (last 3 results) Recent Labs    02/14/18 0353 06/28/18 2225  BNP 495.7* 2,215.5*    ProBNP (last 3 results) No results for input(s): PROBNP in the last 8760 hours.  CBG: Recent Labs  Lab 06/26/18 0026 06/26/18 0439 06/26/18 0732 06/26/18 1110 06/26/18 1623  GLUCAP 89 149* 91 127* 133*       Signed:  Desiree Hane, MD [facility] 07/01/2018, 10:16 PM

## 2018-07-01 NOTE — Progress Notes (Signed)
PROGRESS NOTE    Russell Williamson  HYQ:[id number] DOB: [birth year] DOA: 06/28/2018 PCP: Harvie Junior, MD  Brief Narrative: Russell Williamson is a 62 y.o. male with history of tonsillar cancer status post radiation, chronic recurrent pancreatitis with cachexia and severe malnutrition, hypothyroidism, anemia, hypertension, COPD chronic pain who was just recently admitted for aspiration pneumonia during which patient also underwent PEG tube placement for dysphagia was brought to the ER after patient became confused unresponsive and respiratory distress.  EMS on arrival at home found patient to be hypoxic and was placed on nonrebreather was mildly hypotensive tachycardic and was brought to the ER.  Patient is confused and not much history was obtained from the patient.  Most of my history was obtained from the ER physician and patient's nurse and previous records.   Assessment & Plan:   Principal Problem:   Acute respiratory failure with hypoxia (HCC) Active Problems:   History of cancer tonsil   Hx of radiation therapy   Hypothyroidism (acquired)   S/P gastrostomy ([place])   Relapsing chronic pancreatitis (HCC)   Anemia in chronic illness   Megaloblastic anemia   Protein-calorie malnutrition, severe   COPD with chronic bronchitis (HCC)   Acute metabolic encephalopathy   Bilateral pleural effusion   HCAP (healthcare-associated pneumonia)   Adult failure to thrive   Goals of care, counseling/discussion   Palliative care by specialist   1. Acute respiratory failure with hypoxia and hypercarbia likely from HCAP  Possible Aspiration Pneumonia  Bilateral Pleural Effusions:  1. Vancomycin/cefepime -> negative MRSA PCR, narrowed to cefepime (Flagyl was added for concern for aspiration pna) 2. Cultures are all negative so far. 3. ABG improved, per discussion with PCCM, thought to be a poor BIPAP candidate with his chronic aspiration and mental status.  Mental status has improved.  Seen by  palliative medicine.  No DNR. 4. Bilateral R>L effusions, s/p thoracentesis.  Appears transudative.  Cultures are negative so far. 5. Speech therapy consult was ordered.  Currently n.p.o.  He is getting tube feedings.    2. Goals of Care: seen by palliative care on 9/16 and pt desired full scope care at that time.  Palliative medicine was reconsulted.  Seen yesterday.  No DNR.  Tentative plan is for placement to skilled nursing facility with hospice.  Needs better pain control.  We will increase the dose of his OxyIR.  Patient noted to have multiple loose stools this morning.  He was noted to be on laxatives which was discontinued.  We will put him on some form of bowel regimen tomorrow since he is on narcotics.  3. Acute metabolic encephalopathy likely from hypoxia and hypercarbia - much improved  4. Hypothyroidism TSH 17.789, follow free T3 (low)/T4 (wnl).  Just started on synthroid earlier this month, continue current dose for now.  Recheck labs in a few weeks.    5. Megaloblastic anemia has received transfusion during last admission.  Hemoglobin noted to be 5.6 this morning.  No overt bleeding has been noted.  He will be transfused 2 units of blood.  He is agreeable to the same.  Normal B12, folate.  Iron panel suggestive of AOCD.  6. History of recurrent pancreatitis with cachexia and malnutrition.  Continue creon.  He c/o pain c/w chronic pancreatitis, will follow lipase (wnl).  Adjust pain medication dosage.  7. Recent PEG tube placement for feedings.  Continue tube feedings.  8. COPD we will keep patient on nebulizers.  Stable.  Seen by pulmonology and they  have signed off.  9. History of tonsillar cancer status post radiation.  10. Elevated troponin: mild and flat, c/w priors, no c/o chest pain.  EKG appears c/w priors.    11. Hyponatremia/hypokalemia hypomagnesemia: Sodium level improved to stable.  Potassium level noted to be low this morning.  He will be supplemented.  Magnesium  also low and this will also be supplemented.    12. Atrial Fibrillation: Newly detected on telemetry.  Chadsvasc is 1 for HTN, will hold off on systemic anticoagulation at this time.  Also especially considering that hospice is being recommended.  13. Difficult lab draw: PICC line was ordered but could not be placed yesterday.  We will not subject this patient to a central line at this time.  14. Hypotension: occurred after thora, improved with IVF.    DVT prophylaxis: lovenox to be held today Code Status: DNR Family Communication: Discussed with the patient Disposition Plan: Blood transfusion today.  Plan is for skilled nursing facility placement with hospice and medically improved.   Consultants:   PCCM  Procedures:   none  Antimicrobials:  Anti-infectives (From admission, onward)   Start     Dose/Rate Route Frequency Ordered Stop   06/30/18 1000  metroNIDAZOLE (FLAGYL) 50 mg/ml oral suspension 500 mg     500 mg Per Tube 3 times daily 06/30/18 0754     06/29/18 2200  vancomycin (VANCOCIN) 500 mg in sodium chloride 0.9 % 100 mL IVPB  Status:  Discontinued     500 mg 100 mL/hr over 60 Minutes Intravenous Every 24 hours 06/29/18 0357 06/29/18 0400   06/29/18 2200  vancomycin (VANCOCIN) IVPB 750 mg/150 ml premix  Status:  Discontinued     750 mg 150 mL/hr over 60 Minutes Intravenous Every 24 hours 06/29/18 0400 06/30/18 0754   06/29/18 1000  ceFEPIme (MAXIPIME) 1 g in sodium chloride 0.9 % 100 mL IVPB     1 g 200 mL/hr over 30 Minutes Intravenous Every 12 hours 06/29/18 0357     06/29/18 0300  vancomycin (VANCOCIN) IVPB 1000 mg/200 mL premix  Status:  Discontinued     1,000 mg 200 mL/hr over 60 Minutes Intravenous  Once 06/29/18 0248 06/29/18 0250   06/29/18 0300  ceFEPIme (MAXIPIME) 2 g in sodium chloride 0.9 % 100 mL IVPB  Status:  Discontinued     2 g 200 mL/hr over 30 Minutes Intravenous  Once 06/29/18 0248 06/29/18 0251   06/28/18 2245  vancomycin (VANCOCIN) IVPB 1000  mg/200 mL premix     1,000 mg 200 mL/hr over 60 Minutes Intravenous  Once 06/28/18 2236 06/29/18 0007   06/28/18 2245  ceFEPIme (MAXIPIME) 2 g in sodium chloride 0.9 % 100 mL IVPB     2 g 200 mL/hr over 30 Minutes Intravenous  Once 06/28/18 2236 06/28/18 2335      Subjective: Notes chronic abdominal pain and SOB.  Objective: Vitals:   07/01/18 0440 07/01/18 0800 07/01/18 0820 07/01/18 0837  BP: 93/64  112/74   Pulse: (!) 103 96 99   Resp: (!) 9 13 15    Temp:      TempSrc:      SpO2: 100% 100% 100% 100%  Weight:      Height:        Intake/Output Summary (Last 24 hours) at 07/01/2018 1145 Last data filed at 07/01/2018 1000 Gross per 24 hour  Intake 680 ml  Output 550 ml  Net 130 ml   Filed Weights   06/29/18 0700 06/30/18  0409  Weight: 45.2 kg 44.4 kg    Examination:  General: Awake alert.  In no distress Cardiovascular: S1-S2 is irregularly irregular.  No S3-S4 Lungs: Mildly tachypneic at rest.  Coarse breath sounds bilaterally.  No wheezing. Abdomen: Abdomen is soft.  Nontender nondistended.  PEG tube is noted Neurological: Alert and oriented x3.  No obvious focal neurological deficits.    Data Reviewed: I have personally reviewed following labs and imaging studies  CBC: Recent Labs  Lab 06/25/18 0623 06/28/18 2225 06/28/18 2255 06/29/18 0314 07/01/18 0636  WBC 6.4 11.5*  --  13.4* 8.7  NEUTROABS  --  10.1*  --  11.5*  --   HGB 9.5* 7.8* 6.8* 7.4* 5.6*  HCT 28.2* 24.2* 20.0* 23.4* 16.3*  MCV 101.4* 107.1*  --  106.8* 101.9*  PLT 209 354  --  381 825   Basic Metabolic Panel: Recent Labs  Lab 06/26/18 0939 06/28/18 2225 06/28/18 2255 06/29/18 0314 07/01/18 0636  NA 135 130* 129* 127* 131*  K 4.8 3.6 3.7 3.7 2.8*  CL 95* 92* 86* 87* 91*  CO2 29 29  --  30 32  GLUCOSE 118* 160* 163* 182* 81  BUN 7* 15 20 18 8   CREATININE 0.35* 0.50* 0.50* 0.47* <0.30*  CALCIUM 7.5* 6.8*  --  7.3* 6.6*  MG  --   --   --   --  1.0*  PHOS  --   --   --   --  3.1    GFR: CrCl cannot be calculated (This lab value cannot be used to calculate CrCl because it is not a number: <0.30). Liver Function Tests: Recent Labs  Lab 06/28/18 2225 06/29/18 0314  AST 28 27  ALT 26 29  ALKPHOS 86 93  BILITOT 0.7 0.4  PROT 5.5* 5.9*  ALBUMIN 1.8* 2.0*   Recent Labs  Lab 06/29/18 1118  LIPASE 18   Coagulation Profile: Recent Labs  Lab 06/28/18 2225  INR 1.25   Cardiac Enzymes: Recent Labs  Lab 06/28/18 2225 06/29/18 0314 06/29/18 1118  TROPONINI 0.05* 0.06* 0.04*   CBG: Recent Labs  Lab 06/26/18 0026 06/26/18 0439 06/26/18 0732 06/26/18 1110 06/26/18 1623  GLUCAP 89 149* 91 127* 133*   Thyroid Function Tests: Recent Labs    06/29/18 0314 06/29/18 0830 06/29/18 1118  TSH 17.789*  --   --   FREET4  --   --  0.97  T3FREE  --  1.2*  --    Anemia Panel: Recent Labs    06/29/18 0825 06/29/18 1118  VITAMINB12  --  929*  FOLATE 18.2  --   FERRITIN  --  611*  TIBC  --  101*  IRON  --  88   Sepsis Labs: Recent Labs  Lab 06/28/18 2256 06/29/18 0206 06/29/18 0314 06/29/18 0351 06/29/18 0825  PROCALCITON  --   --  0.52  --   --   LATICACIDVEN 2.44* 2.34*  --  1.3 2.7*    Recent Results (from the past 240 hour(s))  Blood Culture (routine x 2)     Status: None (Preliminary result)   Collection Time: 06/28/18 10:25 PM  Result Value Ref Range Status   Specimen Description BLOOD RIGHT ANTECUBITAL  Final   Special Requests   Final    BOTTLES DRAWN AEROBIC AND ANAEROBIC Blood Culture results may not be optimal due to an inadequate volume of blood received in culture bottles   Culture   Final    NO GROWTH  3 DAYS Performed at [facility], [place] [address], [place], [place] [address]    Report Status PENDING  Incomplete  Urine culture     Status: None   Collection Time: 06/28/18 10:43 PM  Result Value Ref Range Status   Specimen Description URINE, RANDOM  Final   Special Requests NONE  Final   Culture   Final    NO  GROWTH Performed at [facility], [place] [address], [place], [place] [address]    Report Status 06/30/2018 FINAL  Final  Blood Culture (routine x 2)     Status: None (Preliminary result)   Collection Time: 06/28/18 10:45 PM  Result Value Ref Range Status   Specimen Description SITE NOT SPECIFIED  Final   Special Requests   Final    BOTTLES DRAWN AEROBIC AND ANAEROBIC Blood Culture adequate volume   Culture   Final    NO GROWTH 3 DAYS Performed at [facility], [address] [place], [place] [address]    Report Status PENDING  Incomplete  MRSA PCR Screening     Status: None   Collection Time: 06/29/18  9:14 AM  Result Value Ref Range Status   MRSA by PCR NEGATIVE NEGATIVE Final    Comment:        The GeneXpert MRSA Assay (FDA approved for NASAL specimens only), is one component of a comprehensive MRSA colonization surveillance program. It is not intended to diagnose MRSA infection nor to guide or monitor treatment for MRSA infections. Performed at [facility], [place] [address], [place], [place] [address]   Gram stain     Status: None   Collection Time: 06/29/18 12:27 PM  Result Value Ref Range Status   Specimen Description PLEURAL RIGHT  Final   Special Requests NONE  Final   Gram Stain   Final    MODERATE WBC PRESENT,BOTH PMN AND MONONUCLEAR NO ORGANISMS SEEN Performed at [facility], [address], [place], [place] [address]    Report Status 06/29/2018 FINAL  Final  Culture, body fluid-bottle     Status: None (Preliminary result)   Collection Time: 06/29/18 12:27 PM  Result Value Ref Range Status   Specimen Description PLEURAL RIGHT  Final   Special Requests NONE  Final   Culture   Final    NO GROWTH 2 DAYS Performed at [facility] [address], [place], [place] [address]    Report Status PENDING  Incomplete  Expectorated sputum assessment w rflx to resp cult     Status: None   Collection Time: 06/29/18  5:20 PM  Result Value  Ref Range Status   Specimen Description EXPECTORATED SPUTUM  Final   Special Requests NONE  Final   Sputum evaluation   Final    Sputum specimen not acceptable for testing.  Please recollect.   RESULT CALLED TO, READ BACK BY AND VERIFIED WITH: S FLETCHER 06/29/18  2105 JDW Performed at [facility], [place] [address], [place],  [address]    Report Status 06/30/2018 FINAL  Final         Radiology Studies: Dg Chest Port 1 View  Result Date: 06/29/2018 CLINICAL DATA:  Follow-up thoracentesis EXAM: PORTABLE CHEST 1 VIEW COMPARISON:  06/28/2018 FINDINGS: Reduction in right-sided pleural effusion is noted following thoracentesis. No pneumothorax is noted. Small persistent left pleural effusion is noted. Mild bibasilar atelectatic changes are noted. Cardiac shadow is stable. IMPRESSION: No evidence of pneumothorax  following right-sided thoracentesis. Bibasilar atelectasis. Electronically Signed   By: Inez Catalina M.D.   On: 06/29/2018 15:13   [place] Ekg Site Rite  Result Date: 06/30/2018 If Site Rite image not attached, placement could not be confirmed due to current cardiac rhythm.  Ir Thoracentesis Asp Pleural Space W/img Guide  Result Date: 06/29/2018 INDICATION: Patient with history of tonsillar cancer, recurrent pancreatitis, malnutrition, COPD, respiratory failure/pneumonia, bilateral pleural effusions right greater than left. Request made for diagnostic and therapeutic right thoracentesis. EXAM: ULTRASOUND GUIDED DIAGNOSTIC AND THERAPEUTIC RIGHT THORACENTESIS MEDICATIONS: None COMPLICATIONS: None immediate. PROCEDURE: An ultrasound guided thoracentesis was thoroughly discussed with the patient and questions answered. The benefits, risks, alternatives and complications were also discussed. The patient understands and wishes to proceed with the procedure. Written consent was obtained. Ultrasound was performed to localize and mark an adequate pocket of fluid in the right chest. The area  was then prepped and draped in the normal sterile fashion. 1% Lidocaine was used for local anesthesia. Under ultrasound guidance a 6 Fr Safe-T-Centesis catheter was introduced. Thoracentesis was performed. The catheter was removed and a dressing applied. FINDINGS: A total of approximately 1.5 liters of slightly hazy, yellow fluid was removed. Samples were sent to the laboratory as requested by the clinical team. IMPRESSION: Successful ultrasound guided diagnostic and therapeutic right thoracentesis yielding 1.5 liters of pleural fluid. Read by: Rowe Robert, PA-C Electronically Signed   By: Sandi Mariscal M.D.   On: 06/29/2018 12:16        Scheduled Meds: . sodium chloride   Intravenous Once  . budesonide (PULMICORT) nebulizer solution  0.5 mg Nebulization BID  . chlorhexidine  15 mL Mouth Rinse BID  . feeding supplement (OSMOLITE 1.5 CAL)  237 mL Per Tube TID WC & HS  . feeding supplement (PRO-STAT SUGAR FREE 64)  30 mL Per Tube Daily  . folic acid  1 mg Oral Daily  . free water  250 mL Per Tube Q8H  . ipratropium-albuterol  3 mL Nebulization BID  . levothyroxine  100 mcg Per Tube QAC breakfast  . lipase/protease/amylase  12,000 Units Oral TID WC & HS  . mouth rinse  15 mL Mouth Rinse q12n4p  . megestrol  800 mg Per Tube Daily  . metroNIDAZOLE  500 mg Per Tube TID  . nutrition supplement (JUVEN)  1 packet Per Tube BID BM  . pantoprazole sodium  20 mg Per Tube BID  . phosphorus  500 mg Per Tube TID WC & HS  . potassium chloride  40 mEq Oral Q4H  . sucralfate  1 g Per Tube TID WC & HS   Continuous Infusions: . ceFEPime (MAXIPIME) IV 1 g (07/01/18 0859)     LOS: 2 days       Bonnielee Haff, MD [facility] Pager on [email or web address]  If 7PM-7AM, please contact night-coverage [email or web address] Password [facility] 07/01/2018, 11:45 AM

## 2018-07-01 NOTE — Progress Notes (Signed)
Pt refuse NIV for the night., pt wants to use his oxygen in his nose he states. No increase WOB/SOB or respiratory compromise noted at this time. Patient is stable no distress or complications noted. SATs are stable. Patient is receiving schedule neb treatment at this time.

## 2018-07-01 NOTE — Progress Notes (Addendum)
CRITICAL VALUE ALERT  Critical Value:  Hb=5.6  Date & Time Notied:  07/01/18 @ 7:20 Provider Notified: 07/01/18 @ 7:20

## 2018-07-02 ENCOUNTER — Other Ambulatory Visit: Payer: Self-pay

## 2018-07-02 LAB — BPAM RBC
Blood Product Expiration Date: 201910172359
Blood Product Expiration Date: 201910212359
ISSUE DATE / TIME: 201909281455
ISSUE DATE / TIME: 201909281133
UNIT TYPE AND RH: 6200
Unit Type and Rh: 6200

## 2018-07-02 LAB — BASIC METABOLIC PANEL
Anion gap: 8 (ref 5–15)
BUN: 12 mg/dL (ref 8–23)
CO2: 33 mmol/L — AB (ref 22–32)
Calcium: 6.8 mg/dL — ABNORMAL LOW (ref 8.9–10.3)
Chloride: 93 mmol/L — ABNORMAL LOW (ref 98–111)
Creatinine, Ser: 0.31 mg/dL — ABNORMAL LOW (ref 0.61–1.24)
GFR calc Af Amer: >60 mL/min (ref >60)
GFR calc non Af Amer: >60 mL/min (ref >60)
GLUCOSE: 97 mg/dL (ref 70–99)
Potassium: 3 mmol/L — ABNORMAL LOW (ref 3.5–5.1)
Sodium: 134 mmol/L — ABNORMAL LOW (ref 135–145)

## 2018-07-02 LAB — CBC
HEMATOCRIT: 30.1 % — AB (ref 39.0–52.0)
Hemoglobin: 10.3 g/dL — ABNORMAL LOW (ref 13.0–17.0)
MCH: 33.2 pg (ref 26.0–34.0)
MCHC: 34.2 g/dL (ref 30.0–36.0)
MCV: 97.1 fL (ref 78.0–100.0)
PLATELETS: 305 10*3/uL (ref 150–400)
RBC: 3.1 MIL/uL — AB (ref 4.22–5.81)
RDW: 15.3 % (ref 11.5–15.5)
WBC: 9.9 10*3/uL (ref 4.0–10.5)

## 2018-07-02 LAB — TYPE AND SCREEN
ABO/RH(D): A POS
ANTIBODY SCREEN: NEGATIVE
Unit division: 0
Unit division: 0

## 2018-07-02 LAB — MAGNESIUM: Magnesium: 1.5 mg/dL — ABNORMAL LOW (ref 1.7–2.4)

## 2018-07-02 MED ORDER — LOPERAMIDE HCL 2 MG PO CAPS
2.0000 mg | ORAL_CAPSULE | Freq: Every day | ORAL | Status: DC | PRN
  Administered 2018-07-02 – 2018-07-03 (×2): 2 mg via ORAL
  Filled 2018-07-02 (×2): qty 1

## 2018-07-02 MED ORDER — AMOXICILLIN-POT CLAVULANATE 875-125 MG PO TABS
1.0000 | ORAL_TABLET | Freq: Two times a day (BID) | ORAL | Status: DC
  Administered 2018-07-02 – 2018-07-05 (×7): 1
  Filled 2018-07-02 (×7): qty 1

## 2018-07-02 MED ORDER — MAGNESIUM SULFATE 2 GM/50ML IV SOLN
2.0000 g | Freq: Once | INTRAVENOUS | Status: AC
  Administered 2018-07-02: 2 g via INTRAVENOUS
  Filled 2018-07-02: qty 50

## 2018-07-02 MED ORDER — SACCHAROMYCES BOULARDII 250 MG PO CAPS
250.0000 mg | ORAL_CAPSULE | Freq: Two times a day (BID) | ORAL | Status: DC
  Administered 2018-07-02 – 2018-07-05 (×7): 250 mg
  Filled 2018-07-02 (×7): qty 1

## 2018-07-02 MED ORDER — ENOXAPARIN SODIUM 40 MG/0.4ML ~~LOC~~ SOLN
40.0000 mg | SUBCUTANEOUS | Status: DC
  Administered 2018-07-02 – 2018-07-05 (×4): 40 mg via SUBCUTANEOUS
  Filled 2018-07-02 (×5): qty 0.4

## 2018-07-02 MED ORDER — POTASSIUM CHLORIDE 20 MEQ/15ML (10%) PO SOLN
40.0000 meq | Freq: Two times a day (BID) | ORAL | Status: AC
  Administered 2018-07-02 (×2): 40 meq via ORAL
  Filled 2018-07-02 (×2): qty 30

## 2018-07-02 MED ORDER — OXYCODONE HCL 5 MG PO TABS
15.0000 mg | ORAL_TABLET | ORAL | Status: DC | PRN
  Administered 2018-07-02 – 2018-07-05 (×14): 15 mg
  Filled 2018-07-02 (×15): qty 3

## 2018-07-02 NOTE — Progress Notes (Signed)
Pt declined BIPAP at this time.  Pt tolerating nasal cannula well.

## 2018-07-02 NOTE — NC FL2 (Addendum)
[place] MEDICAID FL2 LEVEL OF CARE SCREENING TOOL     IDENTIFICATION  Patient Name: Russell Williamson Birthdate: 1956/05/15 Sex: male Admission Date (Current Location): 06/28/2018  [facility] and [place] Number:  Herbalist and Address:  [facility] [place]. [facility], [place] [address] [place], [place] [address]      Provider Number: [id number]  Attending Physician Name and Address:  Bonnielee Haff, MD  Relative Name and Phone Number:       Current Level of Care: Hospital Recommended Level of Care: [place] Prior Approval Number:    Date Approved/Denied:   PASRR Number:  [id number]  Discharge Plan: SNF    Current Diagnoses: Patient Active Problem List   Diagnosis Date Noted  . Adult failure to thrive   . Goals of care, counseling/discussion   . Palliative care by specialist   . Bilateral pleural effusion 06/29/2018  . HCAP (healthcare-associated pneumonia) 06/29/2018  . Failure to thrive (0-17)   . Advance care planning   . Abdominal pain 06/10/2018  . Acute metabolic encephalopathy 54/65/0354  . Hypoglycemia 06/09/2018  . Aspiration pneumonia ([place]) 06/09/2018  . Lobar pneumonia ([place]) 06/09/2018  . Abnormal LFTs 06/09/2018  . Acute respiratory failure with hypoxia ([place]) 02/14/2018  . Glasgow coma scale total score 3-8 ([place])   . Respiratory failure ([place])   . Sepsis ([place])   . Pressure injury of skin 02/08/2018  . COPD with chronic bronchitis ([place]) 02/06/2018  . CAP (community acquired pneumonia) 02/06/2018  . Syncope 02/06/2018  . Hyponatremia 02/06/2018  . Dehydration 02/06/2018  . Fall at home, initial encounter 02/06/2018  . Tobacco abuse 02/06/2018  . Protein-calorie malnutrition, severe 07/03/2017  . Abnormal EKG 07/02/2017  . Abdominal pain, epigastric   . COPD exacerbation ([place]) 01/27/2017  . Oral thrush 01/27/2017  . 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 Encounter for intubation 05/07/2016  . Port catheter in place  05/05/2016  . Poor venous access 04/28/2016  . Deficiency anemia 04/15/2016  . Chronic leukopenia 04/19/2015  . Anemia in chronic illness 04/19/2015  . Weight loss, unintentional 04/19/2015  . Relapsing chronic pancreatitis ([place]) 01/17/2015  . Diarrhea 09/04/2014  . 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 Malnutrition of moderate degree ([place]) 09/03/2014  . Hypokalemia 09/02/2014  . Chronic alcoholic pancreatitis ([place]) 09/01/2014  . Mucositis 06/17/2014  . Chronic neck pain 06/17/2014  . S/P gastrostomy ([place]) 06/17/2014  . Nicotine abuse 02/28/2014  . Pancytopenia ([place]) 02/28/2014  . Hypothyroidism (acquired) 11/02/2013  . Hypothyroidism 10/29/2013  . 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 Cervical adenopathy 04/26/2012  . Hiatal hernia   . Hx of radiation therapy   . HTN (hypertension) 10/16/2011  . History of cancer tonsil 08/20/2011    Orientation RESPIRATION BLADDER Height & Weight     Self, Time, Situation, Place  O2(6 liters) External catheter Weight: 110 lb 3.7 oz (50 kg) Height:  5\' 5"  (165.1 cm)  BEHAVIORAL SYMPTOMS/MOOD NEUROLOGICAL BOWEL NUTRITION STATUS  (NA) (NA) Incontinent Feeding tube  AMBULATORY STATUS COMMUNICATION OF NEEDS Skin   Total Care Verbally Other (Comment)(sacrum wound)                       Personal Care Assistance Level of Assistance  Total care       Total Care Assistance: Maximum assistance   Functional Limitations Info  Sight, Hearing, Speech Sight Info: Adequate Hearing Info: Adequate Speech Info: Adequate    SPECIAL CARE FACTORS FREQUENCY  PT (By licensed PT), OT (By licensed  OT)     PT Frequency: 3x weekly OT Frequency: 2x weekly            Contractures Contractures Info: Not present    Additional Factors Info  Code Status Code Status Info: DNR             Current Medications (07/02/2018):  This is the current hospital active medication list Current Facility-Administered Medications  Medication Dose Route Frequency Provider Last Rate  Last Dose  . acetaminophen (TYLENOL) tablet 650 mg  650 mg Per Tube Q6H PRN Elodia Florence., MD   650 mg at 07/01/18 [phone]  . albuterol (PROVENTIL) (2.5 MG/3ML) 0.083% nebulizer solution 2.5 mg  2.5 mg Nebulization Q2H PRN Rise Patience, MD   2.5 mg at 07/01/18 1542  . amoxicillin-clavulanate (AUGMENTIN) 875-125 MG per tablet 1 tablet  1 tablet Per Tube Q12H Bonnielee Haff, MD   1 tablet at 07/02/18 1309  . budesonide (PULMICORT) nebulizer solution 0.5 mg  0.5 mg Nebulization BID Shearon Stalls, Rahul P, PA-C   0.5 mg at 07/02/18 0803  . chlorhexidine (PERIDEX) 0.12 % solution 15 mL  15 mL Mouth Rinse BID Elodia Florence., MD   15 mL at 07/02/18 1009  . enoxaparin (LOVENOX) injection 40 mg  40 mg Subcutaneous Q24H Bonnielee Haff, MD   40 mg at 07/02/18 1327  . feeding supplement (OSMOLITE 1.5 CAL) liquid 237 mL  237 mL Per Tube TID WC & HS Rise Patience, MD   237 mL at 07/02/18 1308  . feeding supplement (PRO-STAT SUGAR FREE 64) liquid 30 mL  30 mL Per Tube Daily Elodia Florence., MD   30 mL at 07/02/18 1350  . folic acid (FOLVITE) tablet 1 mg  1 mg Oral Daily Rise Patience, MD   1 mg at 07/02/18 0850  . free water 250 mL  250 mL Per Tube Q8H Rise Patience, MD   250 mL at 07/02/18 1310  . ipratropium-albuterol (DUONEB) 0.5-2.5 (3) MG/3ML nebulizer solution 3 mL  3 mL Nebulization BID Elodia Florence., MD   3 mL at 07/02/18 0804  . levothyroxine (SYNTHROID, LEVOTHROID) tablet 100 mcg  100 mcg Per Tube QAC breakfast Elodia Florence., MD   100 mcg at 07/02/18 0849  . lipase/protease/amylase (CREON) capsule 12,000 Units  12,000 Units Oral TID WC & HS Elodia Florence., MD   12,000 Units at 07/02/18 1310  . loperamide (IMODIUM) capsule 2 mg  2 mg Oral Daily PRN Bonnielee Haff, MD   2 mg at 07/02/18 1308  . MEDLINE mouth rinse  15 mL Mouth Rinse q12n4p Elodia Florence., MD   15 mL at 07/01/18 1600  . megestrol (MEGACE) 400 MG/10ML suspension  800 mg  800 mg Per Tube Daily Rise Patience, MD   800 mg at 07/02/18 0850  . morphine 2 MG/ML injection 2 mg  2 mg Intravenous Q3H PRN Bonnielee Haff, MD   2 mg at 07/02/18 1300  . nutrition supplement (JUVEN) (JUVEN) powder packet 1 packet  1 packet Per Tube BID BM Elodia Florence., MD   1 packet at 07/02/18 1306  . ondansetron (ZOFRAN) tablet 4 mg  4 mg Oral Q6H PRN Rise Patience, MD   4 mg at 07/01/18 2141   Or  . ondansetron (ZOFRAN) injection 4 mg  4 mg Intravenous Q6H PRN Rise Patience, MD      . oxyCODONE (Oxy IR/ROXICODONE)  immediate release tablet 15 mg  15 mg Per Tube Q4H PRN Bonnielee Haff, MD   15 mg at 07/02/18 1350  . pantoprazole sodium (PROTONIX) 40 mg/20 mL oral suspension 20 mg  20 mg Per Tube BID Rise Patience, MD   20 mg at 07/02/18 0849  . phosphorus (K PHOS NEUTRAL) tablet 500 mg  500 mg Per Tube TID WC & HS Rise Patience, MD   500 mg at 07/02/18 1353  . potassium chloride 20 MEQ/15ML (10%) solution 40 mEq  40 mEq Oral BID Bonnielee Haff, MD   40 mEq at 07/02/18 0847  . saccharomyces boulardii (FLORASTOR) capsule 250 mg  250 mg Per Tube BID Bonnielee Haff, MD   250 mg at 07/02/18 1309  . sucralfate (CARAFATE) 1 GM/10ML suspension 1 g  1 g Per Tube TID WC & HS Rise Patience, MD   1 g at 07/02/18 1309     Discharge Medications: Please see discharge summary for a list of discharge medications.  Relevant Imaging Results:  Relevant Lab Results:   Additional Information SS# [id number]  Carolin Sicks, [place]

## 2018-07-02 NOTE — Progress Notes (Signed)
PROGRESS NOTE    Russell Williamson  CZY:[id number] DOB: 05/15/1956 DOA: 06/28/2018 PCP: Harvie Junior, MD  Brief Narrative: Russell Williamson is a 62 y.o. male with history of tonsillar cancer status post radiation, chronic recurrent pancreatitis with cachexia and severe malnutrition, hypothyroidism, anemia, hypertension, COPD chronic pain who was just recently admitted for aspiration pneumonia during which patient also underwent PEG tube placement for dysphagia was brought to the ER after patient became confused unresponsive and respiratory distress.  EMS on arrival at home found patient to be hypoxic and was placed on nonrebreather was mildly hypotensive tachycardic and was brought to the ER.  Patient is confused and not much history was obtained from the patient.  Most of my history was obtained from the ER physician and patient's nurse and previous records.  Patient was seen by pulmonology.  Poor prognosis.  Seen by palliative medicine.  He is now DNR.  Plan is for placement to skilled nursing facility with hospice.   Assessment & Plan:   Principal Problem:   Acute respiratory failure with hypoxia (HCC) Active Problems:   History of cancer tonsil   Hx of radiation therapy   Hypothyroidism (acquired)   S/P gastrostomy ([place])   Relapsing chronic pancreatitis (HCC)   Anemia in chronic illness   Megaloblastic anemia   Protein-calorie malnutrition, severe   COPD with chronic bronchitis (HCC)   Acute metabolic encephalopathy   Bilateral pleural effusion   HCAP (healthcare-associated pneumonia)   Adult failure to thrive   Goals of care, counseling/discussion   Palliative care by specialist   1. Acute respiratory failure with hypoxia and hypercarbia likely from HCAP  Possible Aspiration Pneumonia  Bilateral Pleural Effusions:  1. Vancomycin/cefepime -> negative MRSA PCR, narrowed to cefepime (Flagyl was added for concern for aspiration pna) 2. Cultures are all negative so far.   Change to Augmentin through PEG tube.  Add probiotics. 3. Per discussion with PCCM, thought to be a poor BIPAP candidate with his chronic aspiration and mental status.  Mental status has improved.  Seen by palliative medicine.  Now DNR. 4. Bilateral R>L effusions, s/p thoracentesis.  Appears transudative.  Cultures are negative so far. 5. Speech therapy consult was ordered.  Currently n.p.o.  He is getting bolus tube feedings.    2. Goals of Care: seen by palliative care on 9/16 and pt desired full scope care at that time.  Palliative medicine was reconsulted. DNR.  Tentative plan is for placement to skilled nursing facility with hospice.  Needs better pain control.  Dose of OxyIR was increased.  This was not adequately controlling his pain so he was placed on parenteral morphine as well.  Not on bowel regimen as he has been having profuse diarrhea.  Laxatives had to be discontinued.  Continue to watch for now.    3. Acute metabolic encephalopathy likely from hypoxia and hypercarbia.  Improved.  Likely close to baseline.  4. Hypothyroidism TSH 17.789, follow free T3 (low)/T4 (wnl).  Started on synthroid earlier this month, continue current dose for now.     5. Megaloblastic anemia has received transfusion during last admission.  Hemoglobin noted to be 5.6 on the morning of four 9/28.  No overt bleeding was noted.  He was transfused 2 units of blood.  Hemoglobin greater than 10.  Yesterday's value could have been erroneous.  Continue to monitor for now.  Normal B12, folate.  Iron panel suggestive of AOCD.  6. History of recurrent pancreatitis with cachexia and malnutrition.  Continue  creon.  Pain is secondary to chronic pancreatitis.  Lipase was normal.  Continue to adjust pain medication doses.   7. Recent PEG tube placement for feedings.  Continue tube feedings.  8. COPD we will keep patient on nebulizers.  Stable.  Seen by pulmonology and they have signed off.  9. History of tonsillar cancer  status post radiation.  10. Elevated troponin: mild and flat, c/w priors, no c/o chest pain.  EKG appears c/w priors.    11. Hyponatremia/hypokalemia hypomagnesemia: Sodium level has improved.  Potassium better than yesterday but still low.  Will be supplemented.  Exam also improved but still low.  This will also be supplemented.     12. Atrial Fibrillation: Newly detected on telemetry.  Chadsvasc is 1 for HTN, will hold off on systemic anticoagulation at this time, also especially considering that hospice is being recommended.  13. Difficult lab draw: PICC line was ordered but could not be placed.  We will not subject this patient to a central line at this time.  14. Hypotension: occurred after thora, improved with IVF.    15.  Acute diarrhea: Possibly due to antibiotics and use of laxatives.  Laxatives were discontinued.  If diarrhea does not improve could try to give him a dose of Imodium.  His abdomen is benign.  DVT prophylaxis: Lovenox was held yesterday due to severe anemia.  No overt bleeding has been noted.  Okay to resume. Code Status: DNR Family Communication: Discussed with the patient Disposition Plan: Continue to supplement electrolytes.  Continue current pain management.  Anticipate discharge to skilled nursing facility with hospice in the next day or so.   Consultants:   PCCM  Procedures:   none  Antimicrobials:  Anti-infectives (From admission, onward)   Start     Dose/Rate Route Frequency Ordered Stop   07/02/18 1000  amoxicillin-clavulanate (AUGMENTIN) 875-125 MG per tablet 1 tablet     1 tablet Per Tube Every 12 hours 07/02/18 0837     06/30/18 1000  metroNIDAZOLE (FLAGYL) 50 mg/ml oral suspension 500 mg  Status:  Discontinued     500 mg Per Tube 3 times daily 06/30/18 0754 07/02/18 0837   06/29/18 2200  vancomycin (VANCOCIN) 500 mg in sodium chloride 0.9 % 100 mL IVPB  Status:  Discontinued     500 mg 100 mL/hr over 60 Minutes Intravenous Every 24 hours  06/29/18 0357 06/29/18 0400   06/29/18 2200  vancomycin (VANCOCIN) IVPB 750 mg/150 ml premix  Status:  Discontinued     750 mg 150 mL/hr over 60 Minutes Intravenous Every 24 hours 06/29/18 0400 06/30/18 0754   06/29/18 1000  ceFEPIme (MAXIPIME) 1 g in sodium chloride 0.9 % 100 mL IVPB  Status:  Discontinued     1 g 200 mL/hr over 30 Minutes Intravenous Every 12 hours 06/29/18 0357 07/02/18 0837   06/29/18 0300  vancomycin (VANCOCIN) IVPB 1000 mg/200 mL premix  Status:  Discontinued     1,000 mg 200 mL/hr over 60 Minutes Intravenous  Once 06/29/18 0248 06/29/18 0250   06/29/18 0300  ceFEPIme (MAXIPIME) 2 g in sodium chloride 0.9 % 100 mL IVPB  Status:  Discontinued     2 g 200 mL/hr over 30 Minutes Intravenous  Once 06/29/18 0248 06/29/18 0251   06/28/18 2245  vancomycin (VANCOCIN) IVPB 1000 mg/200 mL premix     1,000 mg 200 mL/hr over 60 Minutes Intravenous  Once 06/28/18 2236 06/29/18 0007   06/28/18 2245  ceFEPIme (MAXIPIME) 2 g  in sodium chloride 0.9 % 100 mL IVPB     2 g 200 mL/hr over 30 Minutes Intravenous  Once 06/28/18 2236 06/28/18 2335      Subjective: Patient denies any complaints this morning except for continued abdominal pain.  Objective: Vitals:   07/02/18 0804 07/02/18 0809 07/02/18 0855 07/02/18 0900  BP: (!) 137/92  130/72   Pulse: 92   89  Resp: 12   (!) 8  Temp:   98.2 F (36.8 C)   TempSrc:   Oral   SpO2: 94% 94% 99% 99%  Weight:      Height:        Intake/Output Summary (Last 24 hours) at 07/02/2018 1130 Last data filed at 07/01/2018 1800 Gross per 24 hour  Intake 1315.33 ml  Output 1175 ml  Net 140.33 ml   Filed Weights   06/29/18 0700 06/30/18 0409 07/02/18 0406  Weight: 45.2 kg 44.4 kg 50 kg    Examination:  General: Awake alert.  In no distress Cardiovascular: S1-S2 is irregularly irregular.  No S3-S4 Lungs: Mildly tachypneic at rest.  Coarse breath sounds bilaterally.  No wheezing. Abdomen: Abdomen remains soft.  Nontender nondistended.   PEG tube is noted. Neurological: Alert and oriented x3.  No focal neurological deficits.    Data Reviewed: I have personally reviewed following labs and imaging studies  CBC: Recent Labs  Lab 06/28/18 2225 06/28/18 2255 06/29/18 0314 07/01/18 0636 07/01/18 1904 07/02/18 0351  WBC 11.5*  --  13.4* 8.7  --  9.9  NEUTROABS 10.1*  --  11.5*  --   --   --   HGB 7.8* 6.8* 7.4* 5.6* 10.4* 10.3*  HCT 24.2* 20.0* 23.4* 16.3* 30.6* 30.1*  MCV 107.1*  --  106.8* 101.9*  --  97.1  PLT 354  --  381 261  --  063   Basic Metabolic Panel: Recent Labs  Lab 06/26/18 0939 06/28/18 2225 06/28/18 2255 06/29/18 0314 07/01/18 0636 07/02/18 0351  NA 135 130* 129* 127* 131* 134*  K 4.8 3.6 3.7 3.7 2.8* 3.0*  CL 95* 92* 86* 87* 91* 93*  CO2 29 29  --  30 32 33*  GLUCOSE 118* 160* 163* 182* 81 97  BUN 7* 15 20 18 8 12   CREATININE 0.35* 0.50* 0.50* 0.47* <0.30* 0.31*  CALCIUM 7.5* 6.8*  --  7.3* 6.6* 6.8*  MG  --   --   --   --  1.0* 1.5*  PHOS  --   --   --   --  3.1  --    GFR: Estimated Creatinine Clearance: 67.7 mL/min (A) (by C-G formula based on SCr of 0.31 mg/dL (L)). Liver Function Tests: Recent Labs  Lab 06/28/18 2225 06/29/18 0314  AST 28 27  ALT 26 29  ALKPHOS 86 93  BILITOT 0.7 0.4  PROT 5.5* 5.9*  ALBUMIN 1.8* 2.0*   Recent Labs  Lab 06/29/18 1118  LIPASE 18   Coagulation Profile: Recent Labs  Lab 06/28/18 2225  INR 1.25   Cardiac Enzymes: Recent Labs  Lab 06/28/18 2225 06/29/18 0314 06/29/18 1118  TROPONINI 0.05* 0.06* 0.04*   CBG: Recent Labs  Lab 06/26/18 0026 06/26/18 0439 06/26/18 0732 06/26/18 1110 06/26/18 1623  GLUCAP 89 149* 91 127* 133*   Sepsis Labs: Recent Labs  Lab 06/28/18 2256 06/29/18 0206 06/29/18 0314 06/29/18 0351 06/29/18 0825  PROCALCITON  --   --  0.52  --   --   LATICACIDVEN 2.44* 2.34*  --  1.3 2.7*    Recent Results (from the past 240 hour(s))  Blood Culture (routine x 2)     Status: None (Preliminary  result)   Collection Time: 06/28/18 10:25 PM  Result Value Ref Range Status   Specimen Description BLOOD RIGHT ANTECUBITAL  Final   Special Requests   Final    BOTTLES DRAWN AEROBIC AND ANAEROBIC Blood Culture results may not be optimal due to an inadequate volume of blood received in culture bottles   Culture   Final    NO GROWTH 3 DAYS Performed at [facility] [address], [place], [place] [address]    Report Status PENDING  Incomplete  Urine culture     Status: None   Collection Time: 06/28/18 10:43 PM  Result Value Ref Range Status   Specimen Description URINE, RANDOM  Final   Special Requests NONE  Final   Culture   Final    NO GROWTH Performed at [facility], [place] [address], [place], [place] [address]    Report Status 06/30/2018 FINAL  Final  Blood Culture (routine x 2)     Status: None (Preliminary result)   Collection Time: 06/28/18 10:45 PM  Result Value Ref Range Status   Specimen Description SITE NOT SPECIFIED  Final   Special Requests   Final    BOTTLES DRAWN AEROBIC AND ANAEROBIC Blood Culture adequate volume   Culture   Final    NO GROWTH 3 DAYS Performed at [facility], [address], [place], [place] [address]    Report Status PENDING  Incomplete  MRSA PCR Screening     Status: None   Collection Time: 06/29/18  9:14 AM  Result Value Ref Range Status   MRSA by PCR NEGATIVE NEGATIVE Final    Comment:        The GeneXpert MRSA Assay (FDA approved for NASAL specimens only), is one component of a comprehensive MRSA colonization surveillance program. It is not intended to diagnose MRSA infection nor to guide or monitor treatment for MRSA infections. Performed at [facility], [place] [address], [place], [place] [address]   Gram stain     Status: None   Collection Time: 06/29/18 12:27 PM  Result Value Ref Range Status   Specimen Description PLEURAL RIGHT  Final   Special Requests NONE  Final   Gram Stain   Final    MODERATE  WBC PRESENT,BOTH PMN AND MONONUCLEAR NO ORGANISMS SEEN Performed at [facility], [address], [place], [place] [address]    Report Status 06/29/2018 FINAL  Final  Culture, body fluid-bottle     Status: None (Preliminary result)   Collection Time: 06/29/18 12:27 PM  Result Value Ref Range Status   Specimen Description PLEURAL RIGHT  Final   Special Requests NONE  Final   Culture   Final    NO GROWTH 2 DAYS Performed at [facility] [address] [place], [place] [address]    Report Status PENDING  Incomplete  Expectorated sputum assessment w rflx to resp cult     Status: None   Collection Time: 06/29/18  5:20 PM  Result Value Ref Range Status   Specimen Description EXPECTORATED SPUTUM  Final   Special Requests NONE  Final   Sputum evaluation   Final    Sputum specimen not acceptable for testing.  Please recollect.   RESULT CALLED TO, READ BACK BY AND VERIFIED WITH: S FLETCHER 06/29/18  2105 JDW Performed at [facility] [address], [place], [place] [address]    Report Status 06/30/2018 FINAL  Final         Radiology Studies: No results found.      Scheduled Meds: . amoxicillin-clavulanate  1 tablet Per Tube Q12H  . budesonide (PULMICORT) nebulizer solution  0.5 mg Nebulization BID  . chlorhexidine  15 mL Mouth Rinse BID  . feeding supplement (OSMOLITE 1.5 CAL)  237 mL Per Tube TID WC & HS  . feeding supplement (PRO-STAT SUGAR FREE 64)  30 mL Per Tube Daily  . folic acid  1 mg Oral Daily  . free water  250 mL Per Tube Q8H  . ipratropium-albuterol  3 mL Nebulization BID  . levothyroxine  100 mcg Per Tube QAC breakfast  . lipase/protease/amylase  12,000 Units Oral TID WC & HS  . mouth rinse  15 mL Mouth Rinse q12n4p  . megestrol  800 mg Per Tube Daily  . nutrition supplement (JUVEN)  1 packet Per Tube BID BM  . pantoprazole sodium  20 mg Per Tube BID  . phosphorus  500 mg Per Tube TID WC & HS  . potassium chloride  40 mEq Oral BID  .  saccharomyces boulardii  250 mg Per Tube BID  . sucralfate  1 g Per Tube TID WC & HS   Continuous Infusions:    LOS: 3 days       Bonnielee Haff, MD [facility] Pager on [email or web address]  If 7PM-7AM, please contact night-coverage [email or web address] Password [email or web address] 07/02/2018, 11:30 AM

## 2018-07-03 LAB — CBC
HEMATOCRIT: 29.4 % — AB (ref 39.0–52.0)
Hemoglobin: 9.9 g/dL — ABNORMAL LOW (ref 13.0–17.0)
MCH: 33.7 pg (ref 26.0–34.0)
MCHC: 33.7 g/dL (ref 30.0–36.0)
MCV: 100 fL (ref 78.0–100.0)
Platelets: 291 10*3/uL (ref 150–400)
RBC: 2.94 MIL/uL — ABNORMAL LOW (ref 4.22–5.81)
RDW: 15.3 % (ref 11.5–15.5)
WBC: 9.1 10*3/uL (ref 4.0–10.5)

## 2018-07-03 LAB — BASIC METABOLIC PANEL
Anion gap: 6 (ref 5–15)
BUN: 10 mg/dL (ref 8–23)
CO2: 35 mmol/L — ABNORMAL HIGH (ref 22–32)
CREATININE: 0.34 mg/dL — AB (ref 0.61–1.24)
Calcium: 7.5 mg/dL — ABNORMAL LOW (ref 8.9–10.3)
Chloride: 98 mmol/L (ref 98–111)
GFR calc Af Amer: >60 mL/min (ref >60)
Glucose, Bld: 91 mg/dL (ref 70–99)
Potassium: 3.7 mmol/L (ref 3.5–5.1)
SODIUM: 139 mmol/L (ref 135–145)

## 2018-07-03 LAB — CULTURE, BLOOD (ROUTINE X 2)
CULTURE: NO GROWTH
Culture: NO GROWTH
SPECIAL REQUESTS: ADEQUATE

## 2018-07-03 LAB — C DIFFICILE QUICK SCREEN W PCR REFLEX
C DIFFICILE (CDIFF) TOXIN: NEGATIVE
C Diff antigen: NEGATIVE
C Diff interpretation: NOT DETECTED

## 2018-07-03 LAB — MAGNESIUM: MAGNESIUM: 1.7 mg/dL (ref 1.7–2.4)

## 2018-07-03 NOTE — Plan of Care (Signed)

## 2018-07-03 NOTE — Care Management Note (Addendum)
Case Management Note  Patient Details  Name: Russell Williamson MRN: [id number] Date of Birth: June 17, 1956  Subjective/Objective:  Pt presented for Acute Respiratory Failure. PTA from home with Niece [name]. Per [facility] patient was sleeping on the couch PTA. Pt has DME 02 and will need Hospital Bed and [facility] upon return home. PT recommendations for SNF- [name] is refusing SNF at this time for patient. CM did discuss with [name] that pt has 43% risk for readmission if he returns home with [facility]. Niece still wants patient to return home- stating she will need help. CM did direct her to call Social Services in regards to [employer]- family will need to schedule this.               Action/Plan: Pt was previously active with [facility]-family wants to utilize again. Plan for Palliative to consult- pt may need Palliative to follow in the home vs Hospice Services. CM will continue to monitor.   Expected Discharge Date:                  Expected Discharge Plan:  [place]  In-House Referral:  Clinical Social Work  Discharge planning Services  CM Consult  Post Acute Care Choice:  Home Health, Resumption of Svcs/PTA Provider Choice offered to:  Patient, Adult Children  DME Arranged:  Hospital bed, Wheelchair manual DME Agency:  [place]:  RN, Disease Management, PT, OT, Nurse's Aide, Refused SNF-family refusing [place] Agency:  [place]  Status of Service:  Completed, signed off  If discussed at [place] of Stay Meetings, dates discussed:    Additional Comments: 1146 07-04-18 Jacqlyn Krauss, RN,BSN [phone] CM asked CSW to speak with patient in regards to home vs SNF. CM spoke with [facility]-Palliative in regards to plan of care. CM to follow and speak with pt in regards to SNF vs Home with Hospice.     PCP: Dr. York Ram [phone]  [place] Fax [phone].  Bethena Roys, RN 07/03/2018, 12:28 PM

## 2018-07-03 NOTE — Evaluation (Signed)
Occupational Therapy Re-Evaluation Patient Details Name: Russell Williamson MRN: [id number] DOB: 1955-12-30 Today's Date: 07/03/2018    History of Present Illness Pt is a 62 y/o male admitted secondary to confusion and acute respiratory failure with hypoxia. Pt also with bilat pleural effusions and is s/p R thoracentesis. PMH includes recent peg tube placement secondary to dysphagia, tonsil cancer, COPD, HTN, anemia, and recurrent pancreatitis.    Clinical Impression   Patient is s/p R thoracentesis surgery resulting in functional limitations due to the deficits listed below (see OT problem list). Pt limited by fatigue that affects all adls. Pt noted to have bil UE edema and pitting edema present. Pt with cognitive deficits noted as well.  Patient will benefit from skilled OT acutely to increase independence and safety with ADLS to allow discharge SNF.     Follow Up Recommendations  SNF;Supervision/Assistance - 24 hour    Equipment Recommendations  3 in 1 bedside commode;Tub/shower seat(reacher)    Recommendations for Other Services  palliative consult      Precautions / Restrictions Precautions Precautions: Fall Precaution Comments: Orthostatic BP Restrictions Weight Bearing Restrictions: No      Mobility Bed Mobility                  Transfers                      Balance                                           ADL either performed or assessed with clinical judgement   ADL Overall ADL's : Needs assistance/impaired Eating/Feeding: NPO Eating/Feeding Details (indicate cue type and reason): decr hand to mouth and incr hand edeam Grooming: Wash/dry face;Wash/dry hands;Moderate assistance;Sitting   Upper Body Bathing: Maximal assistance;Sitting   Lower Body Bathing: Total assistance   Upper Body Dressing : Sitting;Maximal assistance   Lower Body Dressing: Total assistance;Bed level     Toilet Transfer Details (indicate cue type  and reason): pt just transferred to chair with PT total +2 (A) on OT arrival ( see Pt notes for transfer)           General ADL Comments: pt fatigues quickly and very limited mobility     Vision Baseline Vision/History: Wears glasses Wears Glasses: Reading only Patient Visual Report: No change from baseline Additional Comments: pt noted to have nystagmus looking at therapist to the L without any stop to the nystagmus      Perception     Praxis      Pertinent Vitals/Pain Pain Assessment: Faces Faces Pain Scale: Hurts little more Pain Location: R LE - adjusted during session and reports yeah it quit Pain Descriptors / Indicators: Grimacing Pain Intervention(s): Monitored during session     Hand Dominance Right   Extremity/Trunk Assessment Upper Extremity Assessment Upper Extremity Assessment: Generalized weakness;RUE deficits/detail;LUE deficits/detail RUE Deficits / Details: edema at the hand and throughout forearm (pitting edema for both). AROM shoulder flexion to 50 degrees and then requires (A) to complete 90 degree shoulder flexion. d RUE Coordination: decreased fine motor;decreased gross motor LUE Deficits / Details: edema throughout forearm. (pitting 2 edema) AROM shoulder flexion to 50 degrees and then requires (A) to complete 90 degree shoulder flexion. d LUE Coordination: decreased fine motor;decreased gross motor   Lower Extremity Assessment Lower Extremity Assessment: Defer to PT evaluation   Cervical /  Trunk Assessment Cervical / Trunk Assessment: Normal   Communication Communication Communication: No difficulties   Cognition Arousal/Alertness: Awake/alert Behavior During Therapy: Flat affect Overall Cognitive Status: No family/caregiver present to determine baseline cognitive functioning Area of Impairment: Problem solving                         Safety/Judgement: Decreased awareness of deficits;Decreased awareness of safety   Problem  Solving: Slow processing;Decreased initiation;Difficulty sequencing;Requires verbal cues;Requires tactile cues General Comments: pt reports "i am going to show you something you have never seen before. I am going to fly there and come back with something in 5 minutes so you know i have been there" pt reporting that he is going to fly to [place] ( watching game show where the prize is a trip to [place]) . OT asking several questions and confirms this is a true statement by patient and not a joke. pt talking to himself and reports "i am getting my tv name together. " pt very self distracted and continuing to talk to himself even with cues from OT   General Comments       Exercises Exercises: Other exercises General Exercises - Lower Extremity Hip Flexion/Marching: (d/c due to pain in "pancreas") Other Exercises Other Exercises: shoulder flexion x10 reps AAROM Other Exercises: elbow and wrist flexion x 10 reps AAROM Other Exercises: hand flexion and extension  Other Exercises: bil ankle pumps x20 reps AAROM   Shoulder Instructions      Home Living Family/patient expects to be discharged to:: Skilled nursing facility         Entrance Stairs-Number of Steps: 1                       Additional Comments: Lives with niece and her kids.       Prior Functioning/Environment Level of Independence: Independent with assistive device(s)        Comments: Reports independence with ADLs and reports using cane for ambulation.         OT Problem List: Decreased strength;Decreased activity tolerance;Decreased knowledge of use of DME or AE;Impaired balance (sitting and/or standing);Decreased coordination;Decreased range of motion;Impaired vision/perception;Decreased cognition;Decreased safety awareness;Decreased knowledge of precautions;Cardiopulmonary status limiting activity;Impaired UE functional use;Pain;Increased edema      OT Treatment/Interventions: Self-care/ADL training;Energy  conservation;Balance training;Therapeutic exercise;DME and/or AE instruction;Neuromuscular education;Therapeutic activities;Patient/family education;Manual therapy;Modalities;Cognitive remediation/compensation;Visual/perceptual remediation/compensation    OT Goals(Current goals can be found in the care plan section) Acute Rehab OT Goals Patient Stated Goal: to show you what you have never seen before OT Goal Formulation: Patient unable to participate in goal setting Time For Goal Achievement: 07/17/18 Potential to Achieve Goals: Good  OT Frequency: Min 2X/week   Barriers to D/C: Decreased caregiver support;Other (comment)          Co-evaluation PT/OT/SLP Co-Evaluation/Treatment: Yes            AM-PAC PT "6 Clicks" Daily Activity     Outcome Measure Help from another person eating meals?: A Lot Help from another person taking care of personal grooming?: A Lot Help from another person toileting, which includes using toliet, bedpan, or urinal?: A Lot Help from another person bathing (including washing, rinsing, drying)?: A Lot Help from another person to put on and taking off regular upper body clothing?: A Lot Help from another person to put on and taking off regular lower body clothing?: Total 6 Click Score: 11   End of Session Nurse Communication:  Mobility status;Precautions  Activity Tolerance: Patient limited by fatigue Patient left: with call bell/phone within reach;in chair  OT Visit Diagnosis: Unsteadiness on feet (R26.81);Other abnormalities of gait and mobility (R26.89);Muscle weakness (generalized) (M62.81)                Time: 6886-4847 OT Time Calculation (min): 13 min Charges:  OT General Charges $OT Visit: 1 Visit OT Evaluation $OT Re-eval: 1 Re-eval   Jeri Modena, OTR/L  Acute Rehabilitation Services Pager: [phone] Office: [phone] .   Parke Poisson B 07/03/2018, 2:11 PM

## 2018-07-03 NOTE — Progress Notes (Signed)
CSW spoke with the pt's niece [name] [phone].  Pt's niece stated that she wants HH for Uncle and that he had it before coming to hospital.  Pt's niece would also like to speak with the physician and palliative care concerning her Uncle's health.  CSW signing off.  Reed Breech LCSWA [phone]

## 2018-07-03 NOTE — Progress Notes (Signed)
Physical Therapy Treatment Patient Details Name: Russell Williamson MRN: [id number] DOB: 1956/07/31 Today's Date: 07/03/2018    History of Present Illness Pt is a 62 y/o male admitted secondary to confusion and acute respiratory failure with hypoxia. Pt also with bilat pleural effusions and is s/p R thoracentesis. PMH includes recent peg tube placement secondary to dysphagia, tonsil cancer, COPD, HTN, anemia, and recurrent pancreatitis.     PT Comments    Pt admitted with above diagnosis. Pt currently with functional limitations due to balance and endurance deficits. Pt was able to stand and pivot to chair with min assist and cues with RW.  Progressing as he was agreeable to get to chair today.  Still recommend SNF for pt to get stronger prior to d/c home.  Note that family wants to take pt home and if so will need to provide 24 hour care.  Agree with hospital bed and wheelchair if pt goes home.   Pt will benefit from skilled PT to increase their independence and safety with mobility to allow discharge to the venue listed below.    Follow Up Recommendations  SNF;Supervision/Assistance - 24 hour     Equipment Recommendations  Rolling walker with 5" wheels;3in1 (PT);Hospital bed;Wheelchair [phone] lightweight, antitippers, foot rests);Wheelchair cushion (16x18 pressure relieving cushion)    Recommendations for Other Services OT consult     Precautions / Restrictions Precautions Precautions: Fall Precaution Comments: Orthostatic BP Restrictions Weight Bearing Restrictions: No    Mobility  Bed Mobility Overal bed mobility: Needs Assistance Bed Mobility: Rolling Rolling: Min guard   Supine to sit: Min guard     General bed mobility comments: Pt requird min cues for safety and technique. Pt reports increase in pain s/p bed mobility in abdomen. VCs to scoot EOB.   Transfers Overall transfer level: Needs assistance Equipment used: Rolling walker (2 wheeled) Transfers: Sit to/from  [facility] Sit to Stand: Min assist;+2 safety/equipment;From elevated surface Stand pivot transfers: Min assist;+2 safety/equipment;From elevated surface       General transfer comment: Pt was able to stand to RW and clean bottom as flexi seal was oozing.  Pt then took pivotal steps to chair with cues and min assist. Also assist to move RW.   Ambulation/Gait                 Stairs             Wheelchair Mobility    Modified Rankin (Stroke Patients Only)       Balance Overall balance assessment: Needs assistance Sitting-balance support: Feet supported;No upper extremity supported Sitting balance-Leahy Scale: Fair     Standing balance support: Bilateral upper extremity supported;During functional activity Standing balance-Leahy Scale: Poor Standing balance comment: Reliance on UE support                            Cognition Arousal/Alertness: Awake/alert Behavior During Therapy: Flat affect Overall Cognitive Status: No family/caregiver present to determine baseline cognitive functioning Area of Impairment: Problem solving                         Safety/Judgement: Decreased awareness of deficits;Decreased awareness of safety   Problem Solving: Slow processing;Decreased initiation;Difficulty sequencing;Requires verbal cues;Requires tactile cues General Comments: confused at times as well as pt with decr processing.   Exercises General Exercises - Lower Extremity Ankle Circles/Pumps: AROM;Right;AAROM;Left;10 reps;Supine Heel Slides: AAROM;Both;5 reps Hip ABduction/ADduction: AAROM;Both;5 reps Hip Flexion/Marching: (  d/c due to pain in "pancreas") Other Exercises Other Exercises: shoulder flexion x10 reps AAROM Other Exercises: elbow and wrist flexion x 10 reps AAROM Other Exercises: hand flexion and extension  Other Exercises: bil ankle pumps x20 reps AAROM    General Comments        Pertinent Vitals/Pain Pain  Assessment: Faces Faces Pain Scale: Hurts even more Pain Location: R LE and left LE Pain Descriptors / Indicators: Grimacing Pain Intervention(s): Limited activity within patient's tolerance;Monitored during session;Premedicated before session;Repositioned    Home Living Family/patient expects to be discharged to:: Skilled nursing facility               Additional Comments: Lives with niece and her kids.     Prior Function Level of Independence: Independent with assistive device(s)      Comments: Reports independence with ADLs and reports using cane for ambulation.    PT Goals (current goals can now be found in the care plan section) Acute Rehab PT Goals Patient Stated Goal: to show you what you have never seen before Progress towards PT goals: Progressing toward goals    Frequency    Min 2X/week      PT Plan Current plan remains appropriate    Co-evaluation              AM-PAC PT "6 Clicks" Daily Activity  Outcome Measure  Difficulty turning over in bed (including adjusting bedclothes, sheets and blankets)?: Unable Difficulty moving from lying on back to sitting on the side of the bed? : Unable Difficulty sitting down on and standing up from a chair with arms (e.g., wheelchair, bedside commode, etc,.)?: A Lot Help needed moving to and from a bed to chair (including a wheelchair)?: A Lot Help needed walking in hospital room?: Total Help needed climbing 3-5 steps with a railing? : Total 6 Click Score: 8    End of Session Equipment Utilized During Treatment: Gait belt Activity Tolerance: Patient limited by pain;Patient limited by fatigue Patient left: in chair;with call bell/phone within reach;with chair alarm set Nurse Communication: Mobility status PT Visit Diagnosis: Other abnormalities of gait and mobility (R26.89);Unsteadiness on feet (R26.81);Muscle weakness (generalized) (M62.81);Pain Pain - part of body: (abdomen, back )     Time: 1107-1130 PT  Time Calculation (min) (ACUTE ONLY): 23 min  Charges:  $Therapeutic Exercise: 8-22 mins $Therapeutic Activity: 8-22 mins                     [place] Pager:  [phone]  Office:  [place] 07/03/2018, 3:56 PM

## 2018-07-03 NOTE — Evaluation (Signed)
Clinical/Bedside Swallow Evaluation Patient Details  Name: Russell Williamson MRN: [id number] Date of Birth: 1956/05/22  Today's Date: 07/03/2018 Time: SLP Start Time (ACUTE ONLY): 0753 SLP Stop Time (ACUTE ONLY): 0809 SLP Time Calculation (min) (ACUTE ONLY): 16 min  Past Medical History:  Past Medical History:  Diagnosis Date  . Anemia   . Arthritis   . Cervical adenopathy 04/26/2012  . Chronic back pain    lower  . Chronic pancreatitis ([place])   . Constipation   . COPD (chronic obstructive pulmonary disease) ([place])   . Daily headache 05/23/2012   "small ones"  . Deficiency anemia 04/15/2016  . Dyspnea    with exertion  . G tube feedings ([facility])    hx of  feeding tube in stomach  . GERD (gastroesophageal reflux disease)   . History of blood transfusion ~ 2004   "from the pancreatitis"  . History of radiation therapy 02/02/2011-03/22/2011   head/neck,left tonsil  . Hx of radiation therapy 02/02/11 to 03/22/11   L tonsil  . Hypertension   . Insomnia   . 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 Pancytopenia ([place]) 02/28/2014  . Poor venous access 04/28/2016  . 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 Thrush 11/02/2013  . Tonsillar cancer ([place]) 12/15/2010   Left  . Urinary hesitancy    Past Surgical History:  Past Surgical History:  Procedure Laterality Date  . BILE DUCT STENT PLACEMENT     hx of  . CHOLECYSTECTOMY  04/2005  . DIRECT LARYNGOSCOPY  05/23/2012   Procedure: DIRECT LARYNGOSCOPY;  Surgeon: Rozetta Nunnery, MD;  Location: [facility];  Service: ENT;  Laterality: N/A;  . DIRECT LARYNGOSCOPY N/A 03/23/2013   Procedure: DIRECT LARYNGOSCOPY;  Surgeon: Rozetta Nunnery, MD;  Location: [place];  Service: ENT;  Laterality: N/A;  . ESOPHAGEAL DILATION N/A 03/23/2013   Procedure: ESOPHAGEAL DILATION;  Surgeon: Rozetta Nunnery, MD;  Location: [facility];  Service: ENT;  Laterality: N/A;  .  ESOPHAGOGASTRODUODENOSCOPY (EGD) WITH PROPOFOL N/A 03/01/2017   Procedure: ESOPHAGOGASTRODUODENOSCOPY (EGD) WITH PROPOFOL;  Surgeon: Ladene Artist, MD;  Location: [facility];  Service: Endoscopy;  Laterality: N/A;  . FLEXIBLE SIGMOIDOSCOPY N/A 03/01/2017   Procedure: FLEXIBLE SIGMOIDOSCOPY;  Surgeon: Ladene Artist, MD;  Location: [facility];  Service: Endoscopy;  Laterality: N/A;  . IR GASTROSTOMY TUBE MOD SED  06/19/2018  . IR GENERIC HISTORICAL  04/29/2016   IR US GUIDE VASC ACCESS RIGHT 04/29/2016 [facility]  . IR GENERIC HISTORICAL  04/29/2016   IR FLUORO GUIDE CV LINE RIGHT 04/29/2016 [facility]  . IR GENERIC HISTORICAL  05/12/2016   IR US GUIDE VASC ACCESS LEFT 05/12/2016 [facility]  . IR GENERIC HISTORICAL  05/12/2016   IR FLUORO GUIDE CV LINE LEFT 05/12/2016 [facility]  . IR GENERIC HISTORICAL  06/28/2016   IR US GUIDE VASC ACCESS RIGHT 06/28/2016 Sandi Mariscal, MD [facility]  . IR GENERIC HISTORICAL  06/28/2016   IR FLUORO GUIDE PORT INSERTION RIGHT 06/28/2016 Sandi Mariscal, MD [facility]  . IR REMOVAL TUN ACCESS W/ PORT W/O FL MOD SED  02/10/2017  . IR THORACENTESIS ASP PLEURAL SPACE W/IMG GUIDE  06/29/2018  . MASS BIOPSY  05/23/2012   Procedure: NECK MASS BIOPSY;  Surgeon: Rozetta Nunnery, MD;  Location: [facility];  Service: ENT;  Laterality: N/A;  . RADICAL NECK DISSECTION  05/23/2012   w/mass excision  . RADICAL  NECK DISSECTION  05/23/2012   Procedure: RADICAL NECK DISSECTION;  Surgeon: Rozetta Nunnery, MD;  Location: [facility];  Service: ENT;  Laterality: Left;  . TIBIA FRACTURE SURGERY  1990's   left   HPI:  Russell Williamson is a 62 y.o. male with history of tonsillar cancer status post radiation, dysphagia, chronic recurrent pancreatitis with cachexia and severe malnutrition, hypothyroidism, anemia, hypertension, COPD chronic pain who was just recently admitted for aspiration pneumonia during which patient also underwent PEG tube placement for dysphagia. Pt brought to the  ER after patient became confused unresponsive and respiratory distress.  Chest x-ray showing bilateral pleural effusion and consolidation. Patient required BiPAP and consult critical care. MBS 02/2018 recommended nectar thick and puree; ST followed during 06/2018 admission and placed on Dys 1, honey thick with indications of aspiration (instrumental study not completed).   Assessment / Plan / Recommendation Clinical Impression  Pt familiar to this SLP from previous visit. He has history of dysphagia, tonsillar cancer status post radiation, cachexia, COPD, GERD with Dys 1, honey thick recommended and ST followed pt throughout admission. Initially SLP informed (prior admit) that family was not interested in Palliative care and disposition plan was home. Pt did not appear to tolerate recommended diet during ST sessions and stated he desired a PEG which was placed last admission. Instrumental assessment not completed at that time. Discussions were initiated re: comfort feeds vs strict NPO. Pt now readmitted with acute respiratory failure with hypoxia, possible aspiration pna, bilateral effusions and required BiPAP. Today, SLP reviewed/educated re: swallow status from last week's admission. He reports he was using thickener at home in liquids. Discussed pna recurrence from either oral source (bacteria-aspirating secretions), possibility of aspirating tube feedings and most likely aspirating po's he is consuming. Educated re: options including tube feeds only (with aspiration risk possibly to lower degree), tube feeds and po consumption per usual (3 meals/day) or primarily tube feedings with occasional sips thin water (less irritating) and several bites puree/Dys 2 texture for pleasure. Pt verbalized understanding and stated preference for tube feeding with sips/bites for pleasure however not sure he has the ability to fully appreciate/comprehended overall picture/situation. SLP cleaned oral cavity and provided sips  thin water- thick liquids may not be recommended at this point due to suspected amount of pharyngeal residue and increased risk with aspiration of thick liquids. Cough not immediate but delayed 5-6 seconds. Discussed with Dr. Brunetta Genera before and after this swallow assessment. MD will keep pt NPO and have further discussions for po recommendation. If comfort feeds decided, would allow good oral care followed by sips thin water and small/several bites of puree intermittently for comfort. At this point doubt instrumental assessement would have a significant impact on situation/recommendations but can be performed if needed. ST will follow for plan.   SLP Visit Diagnosis: Dysphagia, pharyngeal phase (R13.13)    Aspiration Risk  Severe aspiration risk;Risk for inadequate nutrition/hydration    Diet Recommendation NPO;Other (Comment)(see impression)   Medication Administration: Via alternative means    Other  Recommendations Oral Care Recommendations: Oral care BID   Follow up Recommendations Other (comment)(TBD)      Frequency and Duration min 2x/week  2 weeks       Prognosis Prognosis for Safe Diet Advancement: Fair Barriers to Reach Goals: Time post onset;Severity of deficits      Swallow Study   General HPI: Amardeep Beckers is a 62 y.o. male with history of tonsillar cancer status post radiation, dysphagia, chronic recurrent pancreatitis  with cachexia and severe malnutrition, hypothyroidism, anemia, hypertension, COPD chronic pain who was just recently admitted for aspiration pneumonia during which patient also underwent PEG tube placement for dysphagia. Pt brought to the ER after patient became confused unresponsive and respiratory distress.  Chest x-ray showing bilateral pleural effusion and consolidation. Patient required BiPAP and consult critical care. MBS 02/2018 recommended nectar thick and puree; ST followed during 06/2018 admission and placed on Dys 1, honey thick with indications of  aspiration (instrumental study not completed). Type of Study: Bedside Swallow Evaluation Previous Swallow Assessment: see HPI Diet Prior to this Study: NPO Temperature Spikes Noted: No Respiratory Status: Nasal cannula History of Recent Intubation: No Behavior/Cognition: Alert;Cooperative;Pleasant mood Oral Cavity Assessment: Dry Oral Care Completed by SLP: Yes Oral Cavity - Dentition: Edentulous Vision: Functional for self-feeding Self-Feeding Abilities: Able to feed self Patient Positioning: Upright in bed Baseline Vocal Quality: Normal Volitional Cough: Weak Volitional Swallow: Able to elicit    Oral/Motor/Sensory Function Overall Oral Motor/Sensory Function: Within functional limits   Ice Chips Ice chips: Not tested   Thin Liquid Thin Liquid: Impaired Presentation: Cup Oral Phase Impairments: (none) Oral Phase Functional Implications: (nonr) Pharyngeal  Phase Impairments: Cough - Delayed;Throat Clearing - Delayed    Nectar Thick Nectar Thick Liquid: Not tested   Honey Thick Honey Thick Liquid: Not tested   Puree Puree: Not tested   Solid     Solid: Not tested      [place] Siren 07/03/2018,9:45 AM  Orbie Pyo Col[place].Ed Risk analyst [phone] Office [phone]

## 2018-07-03 NOTE — Progress Notes (Signed)
  Speech Language Pathology  Patient Details Name: Russell Williamson MRN: [id number] DOB: [birth year] Today's Date: 07/03/2018 Time: 6468-0321 SLP Time Calculation (min) (ACUTE ONLY): 18 min      Noted change in pt's disposition since seen this am. In hopes that hospice services plans to follow pt at home. See previous note from ST session this am.  Pt stated he would like to eat several bites/sips for comfort with known aspiration risks and stated he does not want to be re-hospitalized if pna worsens or returns however uncertain of niece's understanding of this.       Orbie Pyo [place].Ed Risk analyst [phone] Office [phone]

## 2018-07-03 NOTE — Progress Notes (Signed)
  Speech Language Pathology Treatment: Dysphagia  Patient Details Name: Russell Williamson MRN: [id number] DOB: 03-Jul-1956 Today's Date: 07/03/2018 Time: 3785-8850 SLP Time Calculation (min) (ACUTE ONLY): 18 min  Assessment / Plan / Recommendation Clinical Impression  Pt was engaged and forthcoming during dysphagia treatment session focused on pt education regarding aspiration in relation to pneumonia as well as comfort feeds. While our chart review indicates his current disposition will discharge to a SNF with hospice, pt expressed his desire to return home with his niece and receive home hospice services. When SLP provided education regarding pt's imminent aspiration risk with PO intake, he stated he is aware of the risks and despite those risks and self-reported associated discomfort from SOB during meals, he wishes to take bites and sips of preferred POs for pleasure. SLP encouraged pt to monitor his comfort level when eating and cease intake if the level of discomfort outwieghs his perceived pleasure of POs. Pt acknowledged the meaning of his new DNR status and conveyed acceptance of his medical fragility. Given pt's demonstration of increased awareness regarding risks of PO intake and clarity regarding his wishes, if MD agrees ST recommends continue to provide main nutrition via PEG but allow itermittent bites and sips of pt's preferred POs for pleasure. ST will continue to follow to provide further education and treatment regarding diet safety and efficiency in the acute setting.    HPI HPI: Russell Williamson is a 62 y.o. male with history of tonsillar cancer status post radiation, dysphagia, chronic recurrent pancreatitis with cachexia and severe malnutrition, hypothyroidism, anemia, hypertension, COPD chronic pain who was just recently admitted for aspiration pneumonia during which patient also underwent PEG tube placement for dysphagia. Pt brought to the ER after patient became confused unresponsive  and respiratory distress.  Chest x-ray showing bilateral pleural effusion and consolidation. Patient required BiPAP and consult critical care. MBS 02/2018 recommended nectar thick and puree; ST followed during 06/2018 admission and placed on Dys 1, honey thick with indications of aspiration (instrumental study not completed).      SLP Plan  Continue with current plan of care       Recommendations  Diet recommendations: NPO;Other(comment)(intermittent sips/bites for comfort (as requested by pt)) Liquids provided via: Cup Medication Administration: Via alternative means Supervision: Full supervision/cueing for compensatory strategies Compensations: Hard cough after swallow;Small sips/bites;Multiple dry swallows after each bite/sip Postural Changes and/or Swallow Maneuvers: Seated upright 90 degrees;Upright 30-60 min after meal                Oral Care Recommendations: Oral care BID Follow up Recommendations: Skilled Nursing facility SLP Visit Diagnosis: Dysphagia, pharyngeal phase (R13.13) Plan: Continue with current plan of care       Jettie Booze, Student SLP                 Jettie Booze 07/03/2018, 12:23 PM

## 2018-07-03 NOTE — Progress Notes (Signed)
PROGRESS NOTE    Russell Williamson  VZC:[id number] DOB: 09-16-1956 DOA: 06/28/2018 PCP: Harvie Junior, MD   Brief Narrative:  Russell Williamson a 62 y.o.malewithhistory of tonsillar cancer status post radiation, chronic recurrent pancreatitis with cachexia and severe malnutrition, hypothyroidism, anemia, hypertension, COPD chronic pain who was just recently admitted for aspiration pneumonia during which patient also underwent PEG tube placement for dysphagia was brought to the ER after patient became confused unresponsive and respiratory distress. EMS on arrival at home found patient to be hypoxic and was placed on nonrebreather was mildly hypotensive tachycardic and was brought to the ER. Patient is confused and not much history was obtained from the patient. Most of my history was obtained from the ER physician and patient's nurse and previous records.  Patient was seen by pulmonology.  Poor prognosis.  Seen by palliative medicine.  He is now DNR.  Plan is for placement to skilled nursing facility with hospice.   Assessment & Plan:   Principal Problem:   Acute respiratory failure with hypoxia (HCC) Active Problems:   History of cancer tonsil   Hx of radiation therapy   Hypothyroidism (acquired)   S/P gastrostomy ([place])   Relapsing chronic pancreatitis (HCC)   Anemia in chronic illness   Megaloblastic anemia   Protein-calorie malnutrition, severe   COPD with chronic bronchitis (HCC)   Acute metabolic encephalopathy   Bilateral pleural effusion   HCAP (healthcare-associated pneumonia)   Adult failure to thrive   Goals of care, counseling/discussion   Palliative care by specialist   1. Acute respiratory failure with hypoxia and hypercarbia likely from HCAP  Possible Aspiration Pneumonia  Bilateral Pleural Effusions:  1. Vancomycin/cefepime -> negative MRSA PCR, narrowed to cefepime (Flagyl was added for concern for aspiration pna) 2. Cultures are all negative so far.   Now narrowed to augmentin.  Add probiotics. 3. Per discussion with PCCM, thought to be a poor BIPAP candidate with his chronic aspiration and mental status.  Mental status has improved.  Seen by palliative medicine.  Now DNR. 4. Bilateral R>L effusions, s/p thoracentesis.  Appears transudative.  Cultures are negative so far.  Cytology with mixed inflammatory infiltrate. 5. Speech therapy consult was ordered.  Currently has diet for comfort.  He is getting bolus tube feedings.    2. Goals of Care: seen by palliative care on 9/16 and pt desired full scope care at that time.  Palliative medicine was reconsulted.  Pt is now DNR.  Tentative plan is for placement to skilled nursing facility with hospice.  Needs better pain control.  Dose of OxyIR was increased, will d/c morphine and attempt to optimize pain using PO regimen.  Not on bowel regimen as he has been having profuse diarrhea.  Laxatives had to be discontinued.  Continue to watch for now.    3. Dysphagia:  Main nutrition via peg, but will allow intermittent bites and sips of pt preferred PO for comfort (recommending good oral care followed by sips of thin water and small/several bites of puree intermittently).  4. Acute metabolic encephalopathy likely from hypoxia and hypercarbia.  Improved.  Likely close to baseline.  4. Hypothyroidism TSH 17.789, follow free T3 (low)/T4 (wnl).  Started on synthroid earlier this month, continue current dose for now.     5. Megaloblastic anemia has received transfusion during last admission.  Hemoglobin noted to be 5.6 on the morning of 9/28.  No overt bleeding was noted.  He was transfused 2 units of blood on 9/28.  1. Hb now stable around 10 (9.9 today) Hemoglobin greater than 10.  9/28 value may have been erroneous.  Continue to monitor for now.  Normal B12, folate.  Iron panel suggestive of AOCD.  6. History of recurrent pancreatitis with cachexia and malnutrition.  Continue creon.  Pain is secondary to  chronic pancreatitis.  Lipase was normal.  Continue to adjust pain medication doses.   7. Recent PEG tube placement for feedings.  Continue tube feedings.  8. COPD we will keep patient on nebulizers.  Stable.  Seen by pulmonology and they have signed off.  9. History of tonsillar cancer status post radiation.  10. Elevated troponin: mild and flat, c/w priors, no c/o chest pain.  EKG appears c/w priors.    11. Hyponatremia hypokalemia  hypomagnesemia: improved, follow   12. Atrial Fibrillation: Newly detected on telemetry.  Chadsvasc is 1 for HTN, will hold off on systemic anticoagulation at this time, also especially considering that hospice is being recommended.  13. Difficult lab draw: PICC line was ordered but could not be placed.  We will not subject this patient to a central line at this time.  14. Hypotension: occurred after thora, improved with IVF.    15.  Acute diarrhea: Imodium has been ordered.  Negative C diff.  Has rectal tube in place at this time, continue to monitor for improvement.   DVT prophylaxis: lovenox Code Status: DNR Family Communication: none at bedside Disposition Plan: pending   Consultants:   PCCM  Palliative care  Procedures:   none  Antimicrobials:  Anti-infectives (From admission, onward)   Start     Dose/Rate Route Frequency Ordered Stop   07/02/18 1000  amoxicillin-clavulanate (AUGMENTIN) 875-125 MG per tablet 1 tablet     1 tablet Per Tube Every 12 hours 07/02/18 0837     06/30/18 1000  metroNIDAZOLE (FLAGYL) 50 mg/ml oral suspension 500 mg  Status:  Discontinued     500 mg Per Tube 3 times daily 06/30/18 0754 07/02/18 0837   06/29/18 2200  vancomycin (VANCOCIN) 500 mg in sodium chloride 0.9 % 100 mL IVPB  Status:  Discontinued     500 mg 100 mL/hr over 60 Minutes Intravenous Every 24 hours 06/29/18 0357 06/29/18 0400   06/29/18 2200  vancomycin (VANCOCIN) IVPB 750 mg/150 ml premix  Status:  Discontinued     750 mg 150  mL/hr over 60 Minutes Intravenous Every 24 hours 06/29/18 0400 06/30/18 0754   06/29/18 1000  ceFEPIme (MAXIPIME) 1 g in sodium chloride 0.9 % 100 mL IVPB  Status:  Discontinued     1 g 200 mL/hr over 30 Minutes Intravenous Every 12 hours 06/29/18 0357 07/02/18 0837   06/29/18 0300  vancomycin (VANCOCIN) IVPB 1000 mg/200 mL premix  Status:  Discontinued     1,000 mg 200 mL/hr over 60 Minutes Intravenous  Once 06/29/18 0248 06/29/18 0250   06/29/18 0300  ceFEPIme (MAXIPIME) 2 g in sodium chloride 0.9 % 100 mL IVPB  Status:  Discontinued     2 g 200 mL/hr over 30 Minutes Intravenous  Once 06/29/18 0248 06/29/18 0251   06/28/18 2245  vancomycin (VANCOCIN) IVPB 1000 mg/200 mL premix     1,000 mg 200 mL/hr over 60 Minutes Intravenous  Once 06/28/18 2236 06/29/18 0007   06/28/18 2245  ceFEPIme (MAXIPIME) 2 g in sodium chloride 0.9 % 100 mL IVPB     2 g 200 mL/hr over 30 Minutes Intravenous  Once 06/28/18 2236 06/28/18 2335  Subjective: Asking to eat.  Understands risk of aspiration.  Wants to eat for comfort. Pain is persistent, stable.  Objective: Vitals:   07/03/18 0737 07/03/18 0814 07/03/18 1148 07/03/18 1537  BP:  (!) 143/82 105/84 105/72  Pulse:  93 97 95  Resp:  (!) 8 10 10   Temp:  98 F (36.7 C) 98.1 F (36.7 C) 98.9 F (37.2 C)  TempSrc:  Oral Oral Oral  SpO2: 93% 90% 94% 94%  Weight:      Height:        Intake/Output Summary (Last 24 hours) at 07/03/2018 1727 Last data filed at 07/03/2018 0606 Gross per 24 hour  Intake 250 ml  Output 1100 ml  Net -850 ml   Filed Weights   06/30/18 0409 07/02/18 0406 07/03/18 0409  Weight: 44.4 kg 50 kg 51.2 kg    Examination:  General exam: Appears calm and comfortable  Respiratory system: Clear to auscultation. Respiratory effort normal. Cardiovascular system: S1 & S2 heard, RRR.  Gastrointestinal system: Abdomen is nondistended, soft and ttp in epigastric region Central nervous system: Alert and oriented. No focal  neurological deficits. Extremities: Symmetric 5 x 5 power. Skin: No rashes, lesions or ulcers Psychiatry: Judgement and insight appear normal. Mood & affect appropriate.     Data Reviewed: I have personally reviewed following labs and imaging studies  CBC: Recent Labs  Lab 06/28/18 2225  06/29/18 0314 07/01/18 0636 07/01/18 1904 07/02/18 0351 07/03/18 0343  WBC 11.5*  --  13.4* 8.7  --  9.9 9.1  NEUTROABS 10.1*  --  11.5*  --   --   --   --   HGB 7.8*   < > 7.4* 5.6* 10.4* 10.3* 9.9*  HCT 24.2*   < > 23.4* 16.3* 30.6* 30.1* 29.4*  MCV 107.1*  --  106.8* 101.9*  --  97.1 100.0  PLT 354  --  381 261  --  305 291   < > = values in this interval not displayed.   Basic Metabolic Panel: Recent Labs  Lab 06/28/18 2225 06/28/18 2255 06/29/18 0314 07/01/18 0636 07/02/18 0351 07/03/18 0343  NA 130* 129* 127* 131* 134* 139  K 3.6 3.7 3.7 2.8* 3.0* 3.7  CL 92* 86* 87* 91* 93* 98  CO2 29  --  30 32 33* 35*  GLUCOSE 160* 163* 182* 81 97 91  BUN 15 20 18 8 12 10   CREATININE 0.50* 0.50* 0.47* <0.30* 0.31* 0.34*  CALCIUM 6.8*  --  7.3* 6.6* 6.8* 7.5*  MG  --   --   --  1.0* 1.5* 1.7  PHOS  --   --   --  3.1  --   --    GFR: Estimated Creatinine Clearance: 69.3 mL/min (A) (by C-G formula based on SCr of 0.34 mg/dL (L)). Liver Function Tests: Recent Labs  Lab 06/28/18 2225 06/29/18 0314  AST 28 27  ALT 26 29  ALKPHOS 86 93  BILITOT 0.7 0.4  PROT 5.5* 5.9*  ALBUMIN 1.8* 2.0*   Recent Labs  Lab 06/29/18 1118  LIPASE 18   No results for input(s): AMMONIA in the last 168 hours. Coagulation Profile: Recent Labs  Lab 06/28/18 2225  INR 1.25   Cardiac Enzymes: Recent Labs  Lab 06/28/18 2225 06/29/18 0314 06/29/18 1118  TROPONINI 0.05* 0.06* 0.04*   BNP (last 3 results) No results for input(s): PROBNP in the last 8760 hours. HbA1C: No results for input(s): HGBA1C in the last 72 hours. CBG: No  results for input(s): GLUCAP in the last 168 hours. Lipid  Profile: No results for input(s): CHOL, HDL, LDLCALC, TRIG, CHOLHDL, LDLDIRECT in the last 72 hours. Thyroid Function Tests: No results for input(s): TSH, T4TOTAL, FREET4, T3FREE, THYROIDAB in the last 72 hours. Anemia Panel: No results for input(s): VITAMINB12, FOLATE, FERRITIN, TIBC, IRON, RETICCTPCT in the last 72 hours. Sepsis Labs: Recent Labs  Lab 06/28/18 2256 06/29/18 0206 06/29/18 0314 06/29/18 0351 06/29/18 0825  PROCALCITON  --   --  0.52  --   --   LATICACIDVEN 2.44* 2.34*  --  1.3 2.7*    Recent Results (from the past 240 hour(s))  Blood Culture (routine x 2)     Status: None   Collection Time: 06/28/18 10:25 PM  Result Value Ref Range Status   Specimen Description BLOOD RIGHT ANTECUBITAL  Final   Special Requests   Final    BOTTLES DRAWN AEROBIC AND ANAEROBIC Blood Culture results may not be optimal due to an inadequate volume of blood received in culture bottles   Culture   Final    NO GROWTH 5 DAYS Performed at [facility] [address] [place], [place] [address]    Report Status 07/03/2018 FINAL  Final  Urine culture     Status: None   Collection Time: 06/28/18 10:43 PM  Result Value Ref Range Status   Specimen Description URINE, RANDOM  Final   Special Requests NONE  Final   Culture   Final    NO GROWTH Performed at [facility], [place] [address], [place], [place] [address]    Report Status 06/30/2018 FINAL  Final  Blood Culture (routine x 2)     Status: None   Collection Time: 06/28/18 10:45 PM  Result Value Ref Range Status   Specimen Description SITE NOT SPECIFIED  Final   Special Requests   Final    BOTTLES DRAWN AEROBIC AND ANAEROBIC Blood Culture adequate volume   Culture   Final    NO GROWTH 5 DAYS Performed at [facility], [address] [place], [place] [address]    Report Status 07/03/2018 FINAL  Final  MRSA PCR Screening     Status: None   Collection Time: 06/29/18  9:14 AM  Result Value Ref Range Status   MRSA  by PCR NEGATIVE NEGATIVE Final    Comment:        The GeneXpert MRSA Assay (FDA approved for NASAL specimens only), is one component of a comprehensive MRSA colonization surveillance program. It is not intended to diagnose MRSA infection nor to guide or monitor treatment for MRSA infections. Performed at [facility], [place] [address], [place], [place] [address]   Gram stain     Status: None   Collection Time: 06/29/18 12:27 PM  Result Value Ref Range Status   Specimen Description PLEURAL RIGHT  Final   Special Requests NONE  Final   Gram Stain   Final    MODERATE WBC PRESENT,BOTH PMN AND MONONUCLEAR NO ORGANISMS SEEN Performed at [facility], [address], [place],  [address]    Report Status 06/29/2018 FINAL  Final  Culture, body fluid-bottle     Status: None (Preliminary result)   Collection Time: 06/29/18 12:27 PM  Result Value Ref Range Status   Specimen Description PLEURAL RIGHT  Final   Special Requests NONE  Final   Culture   Final    NO GROWTH 4 DAYS Performed at [facility], [place] [address], [place], [place] [address]    Report Status PENDING  Incomplete  Expectorated sputum assessment w rflx to resp cult     Status: None   Collection Time: 06/29/18  5:20 PM  Result Value Ref Range Status   Specimen Description EXPECTORATED SPUTUM  Final   Special Requests NONE  Final   Sputum evaluation   Final    Sputum specimen not acceptable for testing.  Please recollect.   RESULT CALLED TO, READ BACK BY AND VERIFIED WITH: S FLETCHER 06/29/18  2105 JDW Performed at [facility], [place] [address] [place], [place] [address]    Report Status 06/30/2018 FINAL  Final  C difficile quick scan w PCR reflex     Status: None   Collection Time: 07/03/18  3:59 PM  Result Value Ref Range Status   C Diff antigen NEGATIVE NEGATIVE Final   C Diff toxin NEGATIVE NEGATIVE Final   C Diff interpretation No C. difficile detected.  Final    Comment: Performed  at [facility], [place] [address] [place], [place] [address]         Radiology Studies: No results found.      Scheduled Meds: . amoxicillin-clavulanate  1 tablet Per Tube Q12H  . budesonide (PULMICORT) nebulizer solution  0.5 mg Nebulization BID  . chlorhexidine  15 mL Mouth Rinse BID  . enoxaparin (LOVENOX) injection  40 mg Subcutaneous Q24H  . feeding supplement (OSMOLITE 1.5 CAL)  237 mL Per Tube TID WC & HS  . feeding supplement (PRO-STAT SUGAR FREE 64)  30 mL Per Tube Daily  . folic acid  1 mg Oral Daily  . free water  250 mL Per Tube Q8H  . ipratropium-albuterol  3 mL Nebulization BID  . levothyroxine  100 mcg Per Tube QAC breakfast  . lipase/protease/amylase  12,000 Units Oral TID WC & HS  . mouth rinse  15 mL Mouth Rinse q12n4p  . megestrol  800 mg Per Tube Daily  . nutrition supplement (JUVEN)  1 packet Per Tube BID BM  . pantoprazole sodium  20 mg Per Tube BID  . phosphorus  500 mg Per Tube TID WC & HS  . saccharomyces boulardii  250 mg Per Tube BID  . sucralfate  1 g Per Tube TID WC & HS   Continuous Infusions:   LOS: 4 days    Time spent: over 30 min    Fayrene Helper, MD [facility] Pager [phone]  If 7PM-7AM, please contact night-coverage [email or web address] Password [email or web address] 07/03/2018, 5:27 PM

## 2018-07-04 ENCOUNTER — Inpatient Hospital Stay (HOSPITAL_COMMUNITY): Payer: Medicaid Other

## 2018-07-04 DIAGNOSIS — K861 Other chronic pancreatitis: Secondary | ICD-10-CM

## 2018-07-04 LAB — CBC
HEMATOCRIT: 33.8 % — AB (ref 39.0–52.0)
Hemoglobin: 11.1 g/dL — ABNORMAL LOW (ref 13.0–17.0)
MCH: 33.8 pg (ref 26.0–34.0)
MCHC: 32.8 g/dL (ref 30.0–36.0)
MCV: 103 fL — AB (ref 78.0–100.0)
PLATELETS: 310 10*3/uL (ref 150–400)
RBC: 3.28 MIL/uL — ABNORMAL LOW (ref 4.22–5.81)
RDW: 14.6 % (ref 11.5–15.5)
WBC: 9 10*3/uL (ref 4.0–10.5)

## 2018-07-04 LAB — BASIC METABOLIC PANEL
Anion gap: 10 (ref 5–15)
BUN: 12 mg/dL (ref 8–23)
CALCIUM: 7.6 mg/dL — AB (ref 8.9–10.3)
CO2: 34 mmol/L — AB (ref 22–32)
CREATININE: 0.39 mg/dL — AB (ref 0.61–1.24)
Chloride: 95 mmol/L — ABNORMAL LOW (ref 98–111)
GFR calc Af Amer: >60 mL/min (ref >60)
GFR calc non Af Amer: >60 mL/min (ref >60)
GLUCOSE: 83 mg/dL (ref 70–99)
Potassium: 3.5 mmol/L (ref 3.5–5.1)
Sodium: 139 mmol/L (ref 135–145)

## 2018-07-04 LAB — MAGNESIUM: Magnesium: 1.3 mg/dL — ABNORMAL LOW (ref 1.7–2.4)

## 2018-07-04 LAB — CULTURE, BODY FLUID W GRAM STAIN -BOTTLE: Culture: NO GROWTH

## 2018-07-04 LAB — CULTURE, BODY FLUID-BOTTLE

## 2018-07-04 MED ORDER — MAGNESIUM SULFATE 4 GM/100ML IV SOLN
4.0000 g | Freq: Once | INTRAVENOUS | Status: AC
  Administered 2018-07-04: 4 g via INTRAVENOUS
  Filled 2018-07-04: qty 100

## 2018-07-04 MED ORDER — LOPERAMIDE HCL 2 MG PO CAPS
2.0000 mg | ORAL_CAPSULE | Freq: Four times a day (QID) | ORAL | Status: DC
  Administered 2018-07-04 – 2018-07-05 (×4): 2 mg via ORAL
  Filled 2018-07-04 (×5): qty 1

## 2018-07-04 NOTE — Progress Notes (Signed)
Daily Progress Note   Patient Name: Russell Williamson       Date: 07/04/2018 DOB: [birth year]  Age: 62 y.o. MRN#: [id number] Attending Physician: Elodia Florence., * Primary Care Physician: Harvie Junior, MD Admit Date: 06/28/2018  Reason for Consultation/Follow-up: Establishing goals of care and Hospice Evaluation  Subjective: Patient requesting pain medication, RN at bedside  Length of Stay: 5  Current Medications: Scheduled Meds:  . amoxicillin-clavulanate  1 tablet Per Tube Q12H  . budesonide (PULMICORT) nebulizer solution  0.5 mg Nebulization BID  . chlorhexidine  15 mL Mouth Rinse BID  . enoxaparin (LOVENOX) injection  40 mg Subcutaneous Q24H  . feeding supplement (OSMOLITE 1.5 CAL)  237 mL Per Tube TID WC & HS  . feeding supplement (PRO-STAT SUGAR FREE 64)  30 mL Per Tube Daily  . folic acid  1 mg Oral Daily  . free water  250 mL Per Tube Q8H  . ipratropium-albuterol  3 mL Nebulization BID  . levothyroxine  100 mcg Per Tube QAC breakfast  . lipase/protease/amylase  12,000 Units Oral TID WC & HS  . loperamide  2 mg Oral Q6H  . mouth rinse  15 mL Mouth Rinse q12n4p  . megestrol  800 mg Per Tube Daily  . nutrition supplement (JUVEN)  1 packet Per Tube BID BM  . pantoprazole sodium  20 mg Per Tube BID  . phosphorus  500 mg Per Tube TID WC & HS  . saccharomyces boulardii  250 mg Per Tube BID  . sucralfate  1 g Per Tube TID WC & HS    Continuous Infusions:   PRN Meds: acetaminophen, albuterol, ondansetron **OR** ondansetron (ZOFRAN) IV, oxyCODONE  Physical Exam         Constitutional: He is oriented to person, place, and time. He appears cachectic. He has a sickly appearance. No distress.  Temporal wasting  HENT:  Head: Normocephalic and atraumatic.  Cardiovascular: Normal  rate and regular rhythm.  Pulmonary/Chest: Effort normal. No accessory muscle usage. No respiratory distress.  Abdominal: Soft.  Musculoskeletal:       Right lower leg: Normal.       Left lower leg: Normal.  Neurological: He is alert and oriented to person, place, and time.  Skin: Skin is dry.   Vital Signs: BP 135/68   Pulse 98   Temp 98.5 F (36.9 C) (Oral)   Resp 10   Ht 5' 5"  (1.651 m)   Wt 47.9 kg   SpO2 98%   BMI 17.57 kg/m  SpO2: SpO2: 98 % O2 Device: O2 Device: Nasal Cannula O2 Flow Rate: O2 Flow Rate (L/min): 3 L/min  Intake/output summary:   Intake/Output Summary (Last 24 hours) at 07/04/2018 1454 Last data filed at 07/04/2018 1035 Gross per 24 hour  Intake 1170 ml  Output 500 ml  Net 670 ml   LBM: Last BM Date: 07/02/18 Baseline Weight: Weight: 45.2 kg Most recent weight: Weight: 47.9 kg       Palliative Assessment/Data: PPS 30%    Flowsheet Rows     Most Recent Value  Intake Tab  Referral Department  Hospitalist  Unit at Time of Referral  Intermediate Care Unit  Palliative Care  Primary Diagnosis  Pulmonary  Date Notified  06/29/18  Palliative Care Type  New Palliative care  Reason for referral  Clarify Goals of Care  Date of Admission  06/28/18  Date first seen by Palliative Care  06/30/18  # of days Palliative referral response time  1 Day(s)  # of days IP prior to Palliative referral  1  Clinical Assessment  Palliative Performance Scale Score  40%  Psychosocial & Spiritual Assessment  Palliative Care Outcomes  Patient/Family meeting held?  Yes  Who was at the meeting?  patient  Palliative Care Outcomes  Clarified goals of care, Improved pain interventions, Counseled regarding hospice, Provided advance care planning, Provided psychosocial or spiritual support      Patient Active Problem List   Diagnosis Date Noted  . Adult failure to thrive   . Goals of care, counseling/discussion   . Palliative care by specialist   . Bilateral pleural  effusion 06/29/2018  . HCAP (healthcare-associated pneumonia) 06/29/2018  . Failure to thrive (0-17)   . Advance care planning   . Abdominal pain 06/10/2018  . Acute metabolic encephalopathy 95/18/8416  . Hypoglycemia 06/09/2018  . Aspiration pneumonia ([place]) 06/09/2018  . Lobar pneumonia ([place]) 06/09/2018  . Abnormal LFTs 06/09/2018  . Acute respiratory failure with hypoxia ([place]) 02/14/2018  . Glasgow coma scale total score 3-8 ([place])   . Respiratory failure ([place])   . Sepsis ([place])   . Pressure injury of skin 02/08/2018  . COPD with chronic bronchitis ([place]) 02/06/2018  . CAP (community acquired pneumonia) 02/06/2018  . Syncope 02/06/2018  . Hyponatremia 02/06/2018  . Dehydration 02/06/2018  . Fall at home, initial encounter 02/06/2018  . Tobacco abuse 02/06/2018  . Protein-calorie malnutrition, severe 07/03/2017  . Abnormal EKG 07/02/2017  . Abdominal pain, epigastric   . COPD exacerbation ([place]) 01/27/2017  . Oral thrush 01/27/2017  . 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 Encounter for intubation 05/07/2016  . Port catheter in place 05/05/2016  . Poor venous access 04/28/2016  . Deficiency anemia 04/15/2016  . Chronic leukopenia 04/19/2015  . Anemia in chronic illness 04/19/2015  . Weight loss, unintentional 04/19/2015  . Relapsing chronic pancreatitis ([place]) 01/17/2015  . Diarrhea 09/04/2014  . 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 Malnutrition of moderate degree ([place]) 09/03/2014  . Hypokalemia 09/02/2014  . Chronic alcoholic pancreatitis ([place]) 09/01/2014  . Mucositis 06/17/2014  . Chronic neck pain 06/17/2014  . S/P gastrostomy ([place]) 06/17/2014  . Nicotine abuse 02/28/2014  . Pancytopenia ([place]) 02/28/2014  . Hypothyroidism (acquired) 11/02/2013  . Hypothyroidism 10/29/2013  . 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 Cervical adenopathy 04/26/2012  . Hiatal hernia   . Hx of radiation therapy   . HTN (hypertension) 10/16/2011  . History of cancer tonsil 08/20/2011    Palliative Care Assessment &  Plan   HPI: 62 y.o. male  with past medical history of chronic alcholic pancreatitis, tonisllar cancer s/p radiation, HTN, COPD, depression, cocaine abuse, and GERD admitted on 06/28/2018 with altered mental status.   Found to have hypoxemic and hypercapnic respiratory failure along with HCAP. He was started on BiPAP for ventilatory support. Found to have bilateral pleural effusions and went for thoracentesis 9/26. Had recent hospitalization for sepsis due to aspiration. He had PEG placed during that admission and was discharged home 9/23. PMT was consulted during that admission after PEG tube was placed and decision was made for full code/full scope. PMT reconsulted this admission for goals of care.  Assessment: Met w/patient again today. He is requesting pain medication -  RN at bedside to administer. Tells me pain is well controlled with current regimen.   He has decided not to go to SNF and wants to return home with his niece. We discussed aggressive medical care vs comfort care and returning home with home health or hospice. He tells me he wants to focus on his comfort and avoid further hospitalizations. He is interested in hospice but asks that I discuss this with his niece.  I spoke to his niece, [name], and she agrees to hospice at home. Asks if patient is dying. We discussed reasons I am concerned about Mr. Capizzi - chronic pancreatitis, repeated hospitalizations, failure to thrive. She understands this. Tells me her main focus is his pain control. She requests a hospital bed and wheelchair in the home.  Recommendations/Plan:  Pain controlled with 15 mg oxy IR q4hr - recommend this be continued on discharge  Patient and niece agree to home w/hospice  Discussed plan with case manager - niece requests hospital bed and wheelchair  Code Status:  DNR  Prognosis:   < 6 months  Discharge Planning:  Home with Hospice  Care plan was discussed with patient, niece, case  manager  Thank you for allowing the Palliative Medicine Team to assist in the care of this patient.   Total Time 30 minutes Prolonged Time Billed  no       Greater than 50%  of this time was spent counseling and coordinating care related to the above assessment and plan.  Juel Burrow, DNP, [facility] Palliative Medicine Team Team Phone # [phone]  Pager [phone]

## 2018-07-04 NOTE — Progress Notes (Signed)
Clinical Social Worker following patient for support. CSW met patient at bedside to discuss discharge plans. Patient stated he wants to go home to his niece so she can take care of him and administer his pain medications. CSW explained the process of SNF and stated that a facility would be able to also provide that type of care for him. CSW asked if patient had any other family support besides his nieces. Patient stated that he has a lot of family members that are in and out of his home that are able to provide care. Patient addiment that he does not want to go to a SNF. CSW signing off as patient social work needs have been met   Rhea Pink, MSW,  [place] [phone]

## 2018-07-04 NOTE — Care Management Note (Addendum)
Case Management Note  Patient Details  Name: Russell Williamson MRN: [id number] Date of Birth: [birth year]  Subjective/Objective:  Pt presented for Acute Respiratory Failure. PTA from home with Niece [name]. Per [facility] patient was sleeping on the couch PTA. Pt has DME 02 and will need Hospital Bed and [facility] upon return home. PT recommendations for SNF- [name] is refusing SNF at this time for patient. CM did discuss with [name] that pt has 43% risk for readmission if he returns home with [place]. Niece still wants patient to return home- stating she will need help. CM did direct her to call Social Services in regards to [employer]- family will need to schedule this.               Action/Plan: Pt was previously active with [facility]-family wants to utilize again. Plan for Palliative to consult- pt may need Palliative to follow in the home vs Hospice Services. CM will continue to monitor.   Expected Discharge Date:                  Expected Discharge Plan:  [place]  In-House Referral:  Clinical Social Work  Discharge planning Services  CM Consult  Post Acute Care Choice:  Home Health, Resumption of Svcs/PTA Provider Choice offered to:  Patient, Adult Children  DME Arranged:  Hospital bed, Wheelchair manual DME Agency:  [place]:  RN, Aide [facility] Agency:  [facility] [place]  Status of Service:  Completed, signed off  If discussed at [place] of Stay Meetings, dates discussed:    Additional Comments: 1449 07-05-18 Jacqlyn Krauss, RN,BSN [phone] Per [facility] bed is being delivered now. [facility] on hold (will call) until ultrasound is completed. Niece is aware. Staff RN to call [facility] and Niece when pt is ready to transition home.   1303 07-05-18 Jacqlyn Krauss, RN,BSN [phone] CM did speak with Anderson [place] with [facility] in regards to disposition home. Patient will be able to go home with  [facility] in place and Hospice RN to reevaluate to see if needs to come out. Staff RN to send home [employer] for [employer]. CM did speak with Niece [name] and she states [facility] called for bed delivery. Time will be be between 2-6 pm. Per [facility] patient can come home without the bed being in the home. Per [name] the family can get him into the bed when it arrives. Per Niece [name] to get medications via patients local pharmacy. CM did call MD to make sure plan will be for transition home today if not PTAR will need to be cancelled. CM will follow.    1631 07-04-18 Jacqlyn Krauss, RN,BSN [phone] CM did speak with Niece [name] in regards to disposition needs. Plan will be for home with Hospice Services with [facility] [place]. Referral made to Hospice and they are aware that pt will need DME for home. Pt is refusing SNF and wants to return home with Hospice. Pt will have RN, [employer].     1146 07-04-18 Jacqlyn Krauss, RN,BSN [phone] CM asked CSW to speak with patient in regards to home vs SNF. CM spoke with [facility] in regards to plan of care. CM to follow and speak with pt in regards to SNF vs Home with Hospice.     PCP: Dr. York Ram [phone]  [place] Fax [phone].  Bethena Roys, RN 07/03/2018, 12:28 PM

## 2018-07-04 NOTE — Progress Notes (Signed)
[facility]) RN Visit  Notified by Hassan Rowan RN CM, of family request for [facility] services at home after discharge.  Chart and patient information under review by [facility] physician and eligibility is pending at this time.    Spoke with patient briefly, he was not very interactive, and he asked that I follow up with his niece [name].  Spoke with [facility] by phone, she advised she would like for him to come home with her.  Explained hospice philosophy and team care approach, [name] verbalized understanding and advised that she had been involved in [place] for some time and was familiar.    Plan is to discharge tomorrow to home, once eligibility has been confirmed.    Please send signed and completed DNR for home with the patient.    Pt will need prescriptions for discharge comfort medications.  DME needs have been discussed, pt currently has the following equipment in the home:  O2, walker.  Pt will need the following:  Bed, wheelchair.  We will order this once eligibility confirmed.  Above information shared with RN CM.  Completed discharge summary will need to be faxed to [facility] at [phone] when final.   Thank you for this referral, we will continue to follow.  Venia Carbon BSN, RN [facility] Liaison (listed in [place]

## 2018-07-04 NOTE — Progress Notes (Signed)
  Speech Language Pathology Treatment: Dysphagia  Patient Details Name: Russell Williamson MRN: [id number] DOB: 09-20-1956 Today's Date: 07/04/2018 Time: 0998-3382 SLP Time Calculation (min) (ACUTE ONLY): 8 min  Assessment / Plan / Recommendation Clinical Impression  Pt seen for education re: dysphagia and current plan. Prefer pt not receive meal trays 3 times a day pt can notify RN when he desires sips or bites and RN can retrieve food from floor stock or order from dietary/nutrition if allowed (with how po's are written in order section?). This will decrease frequency/temptation to eat when but he may not be hungry. He states lately he has been wanting sweets. Pt and RN in agreement with plan and ST stocked room with oral care kits.    HPI HPI: Russell Williamson is a 62 y.o. male with history of tonsillar cancer status post radiation, dysphagia, chronic recurrent pancreatitis with cachexia and severe malnutrition, hypothyroidism, anemia, hypertension, COPD chronic pain who was just recently admitted for aspiration pneumonia during which patient also underwent PEG tube placement for dysphagia. Pt brought to the ER after patient became confused unresponsive and respiratory distress.  Chest x-ray showing bilateral pleural effusion and consolidation. Patient required BiPAP and consult critical care. MBS 02/2018 recommended nectar thick and puree; ST followed during 06/2018 admission and placed on Dys 1, honey thick with indications of aspiration (instrumental study not completed).      SLP Plan  Continue with current plan of care       Recommendations  Diet recommendations: Other(comment)(intermittent sips/bites for comfort (as requested by pt)) Liquids provided via: Cup Medication Administration: Via alternative means Supervision: Patient able to self feed Compensations: Hard cough after swallow;Small sips/bites;Multiple dry swallows after each bite/sip Postural Changes and/or Swallow Maneuvers:  Seated upright 90 degrees;Upright 30-60 min after meal                Oral Care Recommendations: Oral care BID Follow up Recommendations: None SLP Visit Diagnosis: Dysphagia, pharyngeal phase (R13.13) Plan: Continue with current plan of care       GO                Houston Siren 07/04/2018, 9:44 AM  Orbie Pyo Colvin Caroli.Ed Risk analyst [phone] Office [phone]

## 2018-07-04 NOTE — Progress Notes (Addendum)
PROGRESS NOTE    Russell Williamson  ZHG:[id number] DOB: 1956/01/26 DOA: 06/28/2018 PCP: Harvie Junior, MD   Brief Narrative:  Russell Williamson 62 y.o.malewithhistory of tonsillar cancer status post radiation, chronic recurrent pancreatitis with cachexia and severe malnutrition, hypothyroidism, anemia, hypertension, COPD chronic pain who was just recently admitted for aspiration pneumonia during which patient also underwent PEG tube placement for dysphagia was brought to the ER after patient became confused unresponsive and respiratory distress. EMS on arrival at home found patient to be hypoxic and was placed on nonrebreather was mildly hypotensive tachycardic and was brought to the ER. Patient is confused and not much history was obtained from the patient. Most of my history was obtained from the ER physician and patient's nurse and previous records.  Patient was seen by pulmonology.  Poor prognosis.  Seen by palliative medicine.  He is now DNR.  Plan initially was for SNF with hospice, but now pt desires home with hospice.  Still with significant diarrhea with rectal tube in place, working on this prior to d/c.  Assessment & Plan:   Principal Problem:   Acute respiratory failure with hypoxia (HCC) Active Problems:   History of cancer tonsil   Hx of radiation therapy   Hypothyroidism (acquired)   S/P gastrostomy ([place])   Relapsing chronic pancreatitis (HCC)   Anemia in chronic illness   Megaloblastic anemia   Protein-calorie malnutrition, severe   COPD with chronic bronchitis (HCC)   Acute metabolic encephalopathy   Bilateral pleural effusion   HCAP (healthcare-associated pneumonia)   Adult failure to thrive   Goals of care, counseling/discussion   Palliative care by specialist   1. Acute respiratory failure with hypoxia and hypercarbia likely from HCAP  Possible Aspiration Pneumonia  Bilateral Pleural Effusions:  1. Vancomycin/cefepime -> negative MRSA PCR, narrowed  to cefepime (Flagyl was added for concern for aspiration pna) 2. Cultures are all negative so far.  Now narrowed to augmentin.  Add probiotics. 3. Per discussion with PCCM, thought to be Russell Williamson poor BIPAP candidate with his chronic aspiration and mental status.  Mental status has improved.  Seen by palliative medicine.  Now DNR. 4. Bilateral R>L effusions, s/p thoracentesis.  Appears transudative.  Cultures are negative so far.  Cytology with mixed inflammatory infiltrate. 5. Speech therapy consult was ordered.  Currently has diet for comfort.  He is getting bolus tube feedings.    2. Goals of Care: seen by palliative care on 9/16 and pt desired full scope care at that time.  Palliative medicine was reconsulted.  Pt is now DNR.  Tentative plan was for placement to skilled nursing facility with hospice, but now pt requesting home with hospice.  Continue oxy15 q4 prn.  Not on bowel regimen as he has been having profuse diarrhea.  Laxatives had to be discontinued.  Continue to watch for now.    3. Dysphagia:  Main nutrition via peg, but will allow intermittent bites and sips of pt preferred PO for comfort (recommending good oral care followed by sips of thin water and small/several bites of puree intermittently).  4. Acute metabolic encephalopathy likely from hypoxia and hypercarbia.  Improved.  Likely close to baseline.  4. Hypothyroidism TSH 17.789, follow free T3 (low)/T4 (wnl).  Started on synthroid earlier this month, continue current dose for now.     5. Megaloblastic anemia has received transfusion during last admission.  Hemoglobin noted to be 5.6 on the morning of 9/28.  No overt bleeding was noted.  He was  transfused 2 units of blood on 9/28.   1. Hb now stable.  9/28 value may have been erroneous.  Continue to monitor for now.  Normal B12, folate.  Iron panel suggestive of AOCD.  6. History of recurrent pancreatitis with cachexia and malnutrition.  Continue creon.  Pain is secondary to  chronic pancreatitis.  Lipase was normal.  Continue to adjust pain medication doses.   7. Recent PEG tube placement for feedings.  Continue tube feedings.  8. COPD we will keep patient on nebulizers.  Stable.  Seen by pulmonology and they have signed off.  9. History of tonsillar cancer status post radiation.  10. Elevated troponin: mild and flat, c/w priors, no c/o chest pain.  EKG appears c/w priors.    11. Hyponatremia hypokalemia  hypomagnesemia: improved, follow   12. Atrial Fibrillation: Newly detected on telemetry.  Chadsvasc is 1 for HTN, will hold off on systemic anticoagulation at this time, also especially considering that hospice is being recommended.  13. Difficult lab draw: PICC line was ordered but could not be placed.  We will not subject this patient to Charmaine Placido central line at this time.  14. Hypotension: occurred after thora, improved with IVF.    15.  Acute diarrhea: Imodium has been ordered.  Negative C diff.  Has rectal tube in place at this time, will schedule imodium as output too high for this to be removed yet per discussion with nursing.  16. Hypomagnesemia: replete and follow   DVT prophylaxis: lovenox Code Status: DNR Family Communication: niece over phone Disposition Plan: pending improvement in diarrhea    Consultants:   PCCM  Palliative care  Procedures:   none  Antimicrobials:  Anti-infectives (From admission, onward)   Start     Dose/Rate Route Frequency Ordered Stop   07/02/18 1000  amoxicillin-clavulanate (AUGMENTIN) 875-125 MG per tablet 1 tablet     1 tablet Per Tube Every 12 hours 07/02/18 0837     06/30/18 1000  metroNIDAZOLE (FLAGYL) 50 mg/ml oral suspension 500 mg  Status:  Discontinued     500 mg Per Tube 3 times daily 06/30/18 0754 07/02/18 0837   06/29/18 2200  vancomycin (VANCOCIN) 500 mg in sodium chloride 0.9 % 100 mL IVPB  Status:  Discontinued     500 mg 100 mL/hr over 60 Minutes Intravenous Every 24 hours 06/29/18  0357 06/29/18 0400   06/29/18 2200  vancomycin (VANCOCIN) IVPB 750 mg/150 ml premix  Status:  Discontinued     750 mg 150 mL/hr over 60 Minutes Intravenous Every 24 hours 06/29/18 0400 06/30/18 0754   06/29/18 1000  ceFEPIme (MAXIPIME) 1 g in sodium chloride 0.9 % 100 mL IVPB  Status:  Discontinued     1 g 200 mL/hr over 30 Minutes Intravenous Every 12 hours 06/29/18 0357 07/02/18 0837   06/29/18 0300  vancomycin (VANCOCIN) IVPB 1000 mg/200 mL premix  Status:  Discontinued     1,000 mg 200 mL/hr over 60 Minutes Intravenous  Once 06/29/18 0248 06/29/18 0250   06/29/18 0300  ceFEPIme (MAXIPIME) 2 g in sodium chloride 0.9 % 100 mL IVPB  Status:  Discontinued     2 g 200 mL/hr over 30 Minutes Intravenous  Once 06/29/18 0248 06/29/18 0251   06/28/18 2245  vancomycin (VANCOCIN) IVPB 1000 mg/200 mL premix     1,000 mg 200 mL/hr over 60 Minutes Intravenous  Once 06/28/18 2236 06/29/18 0007   06/28/18 2245  ceFEPIme (MAXIPIME) 2 g in sodium chloride 0.9 %  100 mL IVPB     2 g 200 mL/hr over 30 Minutes Intravenous  Once 06/28/18 2236 06/28/18 2335         Subjective: Asking for something sweet to eat. C/o pain.  Objective: Vitals:   07/04/18 0839 07/04/18 0849 07/04/18 0854 07/04/18 0900  BP: 127/86 135/68  135/68  Pulse: (!) 102 98  98  Resp: 15 12  10   Temp:      TempSrc:      SpO2: 95% 97% 97% 98%  Weight:      Height:        Intake/Output Summary (Last 24 hours) at 07/04/2018 0945 Last data filed at 07/04/2018 5784 Gross per 24 hour  Intake 773 ml  Output 500 ml  Net 273 ml   Filed Weights   07/02/18 0406 07/03/18 0409 07/04/18 0453  Weight: 50 kg 51.2 kg 47.9 kg    Examination:  General: No acute distress. Cardiovascular: Heart sounds show Kimon Loewen regular rate, and rhythm. Lungs: Clear to auscultation bilaterally Abdomen: Soft, ttp in epigastric region, nondistended.  Peg tube in place. Neurological: Alert and oriented 3. Moves all extremities 4. Cranial nerves II  through XII grossly intact. Skin: Warm and dry. No rashes or lesions. Extremities: No clubbing or cyanosis. No edema.  Psychiatric: Mood and affect are normal. Insight and judgment are appropriate.    Data Reviewed: I have personally reviewed following labs and imaging studies  CBC: Recent Labs  Lab 06/28/18 2225  06/29/18 0314 07/01/18 0636 07/01/18 1904 07/02/18 0351 07/03/18 0343  WBC 11.5*  --  13.4* 8.7  --  9.9 9.1  NEUTROABS 10.1*  --  11.5*  --   --   --   --   HGB 7.8*   < > 7.4* 5.6* 10.4* 10.3* 9.9*  HCT 24.2*   < > 23.4* 16.3* 30.6* 30.1* 29.4*  MCV 107.1*  --  106.8* 101.9*  --  97.1 100.0  PLT 354  --  381 261  --  305 291   < > = values in this interval not displayed.   Basic Metabolic Panel: Recent Labs  Lab 06/28/18 2225 06/28/18 2255 06/29/18 0314 07/01/18 0636 07/02/18 0351 07/03/18 0343  NA 130* 129* 127* 131* 134* 139  K 3.6 3.7 3.7 2.8* 3.0* 3.7  CL 92* 86* 87* 91* 93* 98  CO2 29  --  30 32 33* 35*  GLUCOSE 160* 163* 182* 81 97 91  BUN 15 20 18 8 12 10   CREATININE 0.50* 0.50* 0.47* <0.30* 0.31* 0.34*  CALCIUM 6.8*  --  7.3* 6.6* 6.8* 7.5*  MG  --   --   --  1.0* 1.5* 1.7  PHOS  --   --   --  3.1  --   --    GFR: Estimated Creatinine Clearance: 64.9 mL/min ([place]) (by C-G formula based on SCr of 0.34 mg/dL (L)). Liver Function Tests: Recent Labs  Lab 06/28/18 2225 06/29/18 0314  AST 28 27  ALT 26 29  ALKPHOS 86 93  BILITOT 0.7 0.4  PROT 5.5* 5.9*  ALBUMIN 1.8* 2.0*   Recent Labs  Lab 06/29/18 1118  LIPASE 18   No results for input(s): AMMONIA in the last 168 hours. Coagulation Profile: Recent Labs  Lab 06/28/18 2225  INR 1.25   Cardiac Enzymes: Recent Labs  Lab 06/28/18 2225 06/29/18 0314 06/29/18 1118  TROPONINI 0.05* 0.06* 0.04*   BNP (last 3 results) No results for input(s): PROBNP in the last  8760 hours. HbA1C: No results for input(s): HGBA1C in the last 72 hours. CBG: No results for input(s): GLUCAP in the last  168 hours. Lipid Profile: No results for input(s): CHOL, HDL, LDLCALC, TRIG, CHOLHDL, LDLDIRECT in the last 72 hours. Thyroid Function Tests: No results for input(s): TSH, T4TOTAL, FREET4, T3FREE, THYROIDAB in the last 72 hours. Anemia Panel: No results for input(s): VITAMINB12, FOLATE, FERRITIN, TIBC, IRON, RETICCTPCT in the last 72 hours. Sepsis Labs: Recent Labs  Lab 06/28/18 2256 06/29/18 0206 06/29/18 0314 06/29/18 0351 06/29/18 0825  PROCALCITON  --   --  0.52  --   --   LATICACIDVEN 2.44* 2.34*  --  1.3 2.7*    Recent Results (from the past 240 hour(s))  Blood Culture (routine x 2)     Status: None   Collection Time: 06/28/18 10:25 PM  Result Value Ref Range Status   Specimen Description BLOOD RIGHT ANTECUBITAL  Final   Special Requests   Final    BOTTLES DRAWN AEROBIC AND ANAEROBIC Blood Culture results may not be optimal due to an inadequate volume of blood received in culture bottles   Culture   Final    NO GROWTH 5 DAYS Performed at [place] [address] [place], [place] [address]    Report Status 07/03/2018 FINAL  Final  Urine culture     Status: None   Collection Time: 06/28/18 10:43 PM  Result Value Ref Range Status   Specimen Description URINE, RANDOM  Final   Special Requests NONE  Final   Culture   Final    NO GROWTH Performed at [facility], [place] [address], [place], [place] [address]    Report Status 06/30/2018 FINAL  Final  Blood Culture (routine x 2)     Status: None   Collection Time: 06/28/18 10:45 PM  Result Value Ref Range Status   Specimen Description SITE NOT SPECIFIED  Final   Special Requests   Final    BOTTLES DRAWN AEROBIC AND ANAEROBIC Blood Culture adequate volume   Culture   Final    NO GROWTH 5 DAYS Performed at [facility], [address] [place], [place] [address]    Report Status 07/03/2018 FINAL  Final  MRSA PCR Screening     Status: None   Collection Time: 06/29/18  9:14 AM  Result Value Ref  Range Status   MRSA by PCR NEGATIVE NEGATIVE Final    Comment:        The GeneXpert MRSA Assay (FDA approved for NASAL specimens only), is one component of Eleanor Dimichele comprehensive MRSA colonization surveillance program. It is not intended to diagnose MRSA infection nor to guide or monitor treatment for MRSA infections. Performed at [facility], [place] [address], [place], [place] [address]   Gram stain     Status: None   Collection Time: 06/29/18 12:27 PM  Result Value Ref Range Status   Specimen Description PLEURAL RIGHT  Final   Special Requests NONE  Final   Gram Stain   Final    MODERATE WBC PRESENT,BOTH PMN AND MONONUCLEAR NO ORGANISMS SEEN Performed at [facility], [address], [place], [place] [address]    Report Status 06/29/2018 FINAL  Final  Culture, body fluid-bottle     Status: None   Collection Time: 06/29/18 12:27 PM  Result Value Ref Range Status   Specimen Description PLEURAL RIGHT  Final   Special Requests NONE  Final   Culture  Final    NO GROWTH 5 DAYS Performed at [facility], [place] [address], [place], [place] [address]    Report Status 07/04/2018 FINAL  Final  Expectorated sputum assessment w rflx to resp cult     Status: None   Collection Time: 06/29/18  5:20 PM  Result Value Ref Range Status   Specimen Description EXPECTORATED SPUTUM  Final   Special Requests NONE  Final   Sputum evaluation   Final    Sputum specimen not acceptable for testing.  Please recollect.   RESULT CALLED TO, READ BACK BY AND VERIFIED WITH: S FLETCHER 06/29/18  2105 JDW Performed at [facility], [place] [address] [place], [place] [address]    Report Status 06/30/2018 FINAL  Final  C difficile quick scan w PCR reflex     Status: None   Collection Time: 07/03/18  3:59 PM  Result Value Ref Range Status   C Diff antigen NEGATIVE NEGATIVE Final   C Diff toxin NEGATIVE NEGATIVE Final   C Diff interpretation No C. difficile detected.  Final    Comment:  Performed at [facility], [place] [address] [place], [place] [address]         Radiology Studies: No results found.      Scheduled Meds: . amoxicillin-clavulanate  1 tablet Per Tube Q12H  . budesonide (PULMICORT) nebulizer solution  0.5 mg Nebulization BID  . chlorhexidine  15 mL Mouth Rinse BID  . enoxaparin (LOVENOX) injection  40 mg Subcutaneous Q24H  . feeding supplement (OSMOLITE 1.5 CAL)  237 mL Per Tube TID WC & HS  . feeding supplement (PRO-STAT SUGAR FREE 64)  30 mL Per Tube Daily  . folic acid  1 mg Oral Daily  . free water  250 mL Per Tube Q8H  . ipratropium-albuterol  3 mL Nebulization BID  . levothyroxine  100 mcg Per Tube QAC breakfast  . lipase/protease/amylase  12,000 Units Oral TID WC & HS  . mouth rinse  15 mL Mouth Rinse q12n4p  . megestrol  800 mg Per Tube Daily  . nutrition supplement (JUVEN)  1 packet Per Tube BID BM  . pantoprazole sodium  20 mg Per Tube BID  . phosphorus  500 mg Per Tube TID WC & HS  . saccharomyces boulardii  250 mg Per Tube BID  . sucralfate  1 g Per Tube TID WC & HS   Continuous Infusions:   LOS: 5 days    Time spent: over 30 min    Fayrene Helper, MD [facility] Pager [phone]  If 7PM-7AM, please contact night-coverage [email or web address] Password [email or web address] 07/04/2018, 9:45 AM

## 2018-07-05 ENCOUNTER — Inpatient Hospital Stay (HOSPITAL_COMMUNITY): Payer: Medicaid Other

## 2018-07-05 DIAGNOSIS — R609 Edema, unspecified: Secondary | ICD-10-CM

## 2018-07-05 LAB — BASIC METABOLIC PANEL
ANION GAP: 4 — AB (ref 5–15)
BUN: 13 mg/dL (ref 8–23)
CO2: 35 mmol/L — ABNORMAL HIGH (ref 22–32)
Calcium: 7.1 mg/dL — ABNORMAL LOW (ref 8.9–10.3)
Chloride: 98 mmol/L (ref 98–111)
Creatinine, Ser: 0.37 mg/dL — ABNORMAL LOW (ref 0.61–1.24)
Glucose, Bld: 130 mg/dL — ABNORMAL HIGH (ref 70–99)
Potassium: 3.1 mmol/L — ABNORMAL LOW (ref 3.5–5.1)
Sodium: 137 mmol/L (ref 135–145)

## 2018-07-05 LAB — CBC
HCT: 31.2 % — ABNORMAL LOW (ref 39.0–52.0)
Hemoglobin: 10.1 g/dL — ABNORMAL LOW (ref 13.0–17.0)
MCH: 33.8 pg (ref 26.0–34.0)
MCHC: 32.4 g/dL (ref 30.0–36.0)
MCV: 104.3 fL — ABNORMAL HIGH (ref 78.0–100.0)
PLATELETS: 280 10*3/uL (ref 150–400)
RBC: 2.99 MIL/uL — ABNORMAL LOW (ref 4.22–5.81)
RDW: 14.5 % (ref 11.5–15.5)
WBC: 8.2 10*3/uL (ref 4.0–10.5)

## 2018-07-05 MED ORDER — SACCHAROMYCES BOULARDII 250 MG PO CAPS: 250.0000 mg | ORAL_CAPSULE | Freq: Two times a day (BID) | ORAL | 0 refills | Status: AC

## 2018-07-05 MED ORDER — POTASSIUM CHLORIDE 10 MEQ/100ML IV SOLN
10.0000 meq | INTRAVENOUS | Status: AC
  Administered 2018-07-05 (×4): 10 meq via INTRAVENOUS
  Filled 2018-07-05 (×4): qty 100

## 2018-07-05 MED ORDER — OSMOLITE 1.5 CAL PO LIQD: 237.0000 mL | Freq: Three times a day (TID) | ORAL | 0 refills | Status: AC

## 2018-07-05 MED ORDER — MIRTAZAPINE 30 MG PO TABS: 30.0000 mg | ORAL_TABLET | Freq: Every day | ORAL | 0 refills | Status: AC

## 2018-07-05 MED ORDER — DIPHENOXYLATE-ATROPINE 2.5-0.025 MG/5ML PO LIQD
5.0000 mL | Freq: Four times a day (QID) | ORAL | Status: DC
  Administered 2018-07-05 (×2): 5 mL via ORAL
  Filled 2018-07-05 (×2): qty 5

## 2018-07-05 MED ORDER — K PHOS MONO-SOD PHOS DI & MONO 155-852-130 MG PO TABS: 500.0000 mg | ORAL_TABLET | Freq: Every day | ORAL | 0 refills | Status: AC

## 2018-07-05 MED ORDER — K PHOS MONO-SOD PHOS DI & MONO 155-852-130 MG PO TABS: 500.0000 mg | ORAL_TABLET | Freq: Every day | ORAL | Status: DC

## 2018-07-05 MED ORDER — OXYCODONE HCL 15 MG PO TABS: 15.0000 mg | ORAL_TABLET | ORAL | 0 refills | Status: AC | PRN

## 2018-07-05 MED ORDER — PANCRELIPASE (LIP-PROT-AMYL) 12000-38000 UNITS PO CPEP
24000.0000 [IU] | ORAL_CAPSULE | Freq: Three times a day (TID) | ORAL | Status: DC
  Administered 2018-07-05 (×2): 24000 [IU] via ORAL
  Filled 2018-07-05 (×2): qty 2

## 2018-07-05 MED ORDER — MIRTAZAPINE 7.5 MG PO TABS: 7.5000 mg | ORAL_TABLET | Freq: Every day | ORAL | Status: DC

## 2018-07-05 MED ORDER — DIPHENOXYLATE-ATROPINE 2.5-0.025 MG/5ML PO LIQD: 5.0000 mL | Freq: Four times a day (QID) | ORAL | 0 refills | Status: AC | PRN

## 2018-07-05 MED ORDER — PANCRELIPASE (LIP-PROT-AMYL) 24000-76000 UNITS PO CPEP: 24000.0000 [IU] | ORAL_CAPSULE | Freq: Three times a day (TID) | ORAL | 0 refills | Status: AC

## 2018-07-05 NOTE — Progress Notes (Signed)
[facility]   DME has been ordered, to be delivered between 2-6 today.    Venia Carbon BSN, [facility] Liaison [phone]

## 2018-07-05 NOTE — Progress Notes (Signed)
Occupational Therapy Treatment Patient Details Name: Russell Williamson MRN: [id number] DOB: 01/16/1956 Today's Date: 07/05/2018    History of present illness Pt is a 62 y/o male admitted secondary to confusion and acute respiratory failure with hypoxia. Pt also with bilat pleural effusions and is s/p R thoracentesis. PMH includes recent peg tube placement secondary to dysphagia, tonsil cancer, COPD, HTN, anemia, and recurrent pancreatitis.    OT comments  Pt very easily agitated and changing request as staff helping patient. Pt declining care at times during session and needing encouragement to participate. Recommend SNF level care. Pt plans to d/c home with family and will need maximum amount of (A) possible to ensure decr risk for falls and skin break down.    Follow Up Recommendations  SNF;Supervision/Assistance - 24 hour    Equipment Recommendations  3 in 1 bedside commode;Tub/shower seat;Wheelchair (measurements OT);Wheelchair cushion (measurements OT);Hospital bed;Other (comment)(plans to d/c home )    Recommendations for Other Services      Precautions / Restrictions Precautions Precautions: Fall       Mobility Bed Mobility               General bed mobility comments: up with Pt prior to OT session  Transfers                 General transfer comment: pt completed static sitting without support x3 during session. pt fatigues and requires return to support trunk support    Balance   Sitting-balance support: Feet supported Sitting balance-Leahy Scale: Fair                                     ADL either performed or assessed with clinical judgement   ADL Overall ADL's : Needs assistance/impaired     Grooming: Wash/dry hands;Wash/dry face;Supervision/safety;Sitting Grooming Details (indicate cue type and reason): pt provided warm wash cloth after refusing to stand at sink                    Toilet Transfer Details (indicate cue  type and reason): noted to have condom cath d/c on the floor. Cleaning up chair and floor around patient. CNA / RN notified of need to have reapplied           General ADL Comments: Pt declining any mobility / static standing or repositioning.      Vision       Perception     Praxis      Cognition Arousal/Alertness: Awake/alert Behavior During Therapy: Agitated Overall Cognitive Status: No family/caregiver present to determine baseline cognitive functioning                                 General Comments: pt agreeable to movement to sink level. room setup to accommodate task and then patient states "NO NO I am not moving "        Exercises     Shoulder Instructions       General Comments      Pertinent Vitals/ Pain       Pain Assessment: No/denies pain  Home Living                                          Prior Functioning/Environment  Frequency  Min 2X/week        Progress Toward Goals  OT Goals(current goals can now be found in the care plan section)  Progress towards OT goals: Not progressing toward goals - comment  Acute Rehab OT Goals Patient Stated Goal: none stated OT Goal Formulation: Patient unable to participate in goal setting Time For Goal Achievement: 07/17/18 Potential to Achieve Goals: Good ADL Goals Pt Will Perform Grooming: with min assist;sitting Pt Will Perform Upper Body Bathing: with set-up;with supervision;sitting Pt Will Perform Lower Body Bathing: with mod assist;sitting/lateral leans;sit to/from stand;with caregiver independent in assisting Pt Will Perform Upper Body Dressing: with set-up;with supervision;sitting Pt Will Perform Lower Body Dressing: with mod assist;sitting/lateral leans;sit to/from stand;with caregiver independent in assisting Pt Will Transfer to Toilet: with mod assist;stand pivot transfer;bedside commode Pt Will Perform Toileting - Clothing Manipulation and  hygiene: with min assist;sit to/from stand Pt Will Perform Tub/Shower Transfer: with min guard assist;with supervision;ambulating;shower seat;grab bars;3 in 1;rolling walker Pt/caregiver will Perform Home Exercise Program: Increased ROM;Increased strength;Right Upper extremity;Left upper extremity;Both right and left upper extremity;With minimal assist;With written HEP provided Additional ADL Goal #1: Pt will complete bed mobility mod I as precursor to adls  Plan Discharge plan remains appropriate    Co-evaluation                 AM-PAC PT "6 Clicks" Daily Activity     Outcome Measure   Help from another person eating meals?: A Lot Help from another person taking care of personal grooming?: A Lot Help from another person toileting, which includes using toliet, bedpan, or urinal?: A Lot Help from another person bathing (including washing, rinsing, drying)?: A Lot Help from another person to put on and taking off regular upper body clothing?: A Lot Help from another person to put on and taking off regular lower body clothing?: Total 6 Click Score: 11    End of Session Equipment Utilized During Treatment: Oxygen  OT Visit Diagnosis: Unsteadiness on feet (R26.81);Other abnormalities of gait and mobility (R26.89);Muscle weakness (generalized) (M62.81)   Activity Tolerance Patient limited by fatigue   Patient Left in chair;with call bell/phone within reach;with chair alarm set   Nurse Communication Mobility status;Precautions        Time: 1156(1156)-1207 OT Time Calculation (min): 11 min  Charges: OT General Charges $OT Visit: 1 Visit OT Treatments $Self Care/Home Management : 8-22 mins   Jeri Modena, OTR/L  Acute Rehabilitation Services Pager: [phone] Office: [phone] .    Parke Poisson B 07/05/2018, 3:44 PM

## 2018-07-05 NOTE — Progress Notes (Signed)
Physical Therapy Treatment Patient Details Name: Russell Williamson MRN: [id number] DOB: Sep 17, 1956 Today's Date: 07/05/2018    History of Present Illness Pt is a 62 y/o male admitted secondary to confusion and acute respiratory failure with hypoxia. Pt also with bilat pleural effusions and is s/p R thoracentesis. PMH includes recent peg tube placement secondary to dysphagia, tonsil cancer, COPD, HTN, anemia, and recurrent pancreatitis.     PT Comments    Pt admitted with above diagnosis. Pt currently with functional limitations due to balance and endurance deficits. Pt was able to squat and pivot to recliner from the bed with min assist with incr time for pt to transfer.  Very slow and deliberate with movement.  Pt will benefit from skilled PT to increase their independence and safety with mobility to allow discharge to the venue listed below.     Follow Up Recommendations  Supervision/Assistance - 24 hour;Home health PT(unsure if pt eligible for [facility] services due to Hospice)     Equipment Recommendations  Rolling walker with 5" wheels;3in1 (PT);Hospital bed;Wheelchair (measurements PT);Wheelchair cushion (measurements PT)    Recommendations for Other Services OT consult     Precautions / Restrictions Precautions Precautions: Fall Restrictions Weight Bearing Restrictions: No    Mobility  Bed Mobility Overal bed mobility: Needs Assistance Bed Mobility: Rolling Rolling: Min guard   Supine to sit: Min guard     General bed mobility comments: Pt requird min cues for safety and technique. Pt took incr timeto come to EOB.  Slow and guarded.  Pt took about 8 min to slowly sit up.    Transfers Overall transfer level: Needs assistance   Transfers: Sit to/from [employer] Transfers Sit to Stand: Min guard   Squat pivot transfers: Min assist     General transfer comment: Pt sat about 10 min EOB stating, " I will stand up in a minute. "  Pt sat for some time and then eventually  pushed RW away.  Pt stood slowly with min assist with squat stance and wide BOS and grabbed arms of chair and slowly pivoted to chair.  Pt with flexed posture throughout.   Ambulation/Gait                 Stairs             Wheelchair Mobility    Modified Rankin (Stroke Patients Only)       Balance Overall balance assessment: Needs assistance Sitting-balance support: Feet supported;No upper extremity supported Sitting balance-Leahy Scale: Fair     Standing balance support: Bilateral upper extremity supported;During functional activity Standing balance-Leahy Scale: Poor Standing balance comment: Reliance on UE support                            Cognition Arousal/Alertness: Awake/alert Behavior During Therapy: Flat affect Overall Cognitive Status: No family/caregiver present to determine baseline cognitive functioning Area of Impairment: Problem solving                         Safety/Judgement: Decreased awareness of deficits;Decreased awareness of safety   Problem Solving: Slow processing;Decreased initiation;Difficulty sequencing;Requires verbal cues;Requires tactile cues        Exercises General Exercises - Lower Extremity Ankle Circles/Pumps: AROM;Right;AAROM;Left;10 reps;Supine    General Comments        Pertinent Vitals/Pain Pain Assessment: Faces Faces Pain Scale: Hurts even more Pain Location: back Pain Descriptors / Indicators: Grimacing Pain Intervention(s): Limited  activity within patient's tolerance;Monitored during session;Repositioned  Desat to 88-89% on 3LO2 once in chair after transfer.  HR 107bpm.   Home Living                      Prior Function            PT Goals (current goals can now be found in the care plan section) Progress towards PT goals: Progressing toward goals    Frequency    Min 2X/week      PT Plan Discharge plan needs to be updated    Co-evaluation               AM-PAC PT "6 Clicks" Daily Activity  Outcome Measure  Difficulty turning over in bed (including adjusting bedclothes, sheets and blankets)?: Unable Difficulty moving from lying on back to sitting on the side of the bed? : Unable Difficulty sitting down on and standing up from a chair with arms (e.g., wheelchair, bedside commode, etc,.)?: A Lot Help needed moving to and from a bed to chair (including a wheelchair)?: A Lot Help needed walking in hospital room?: Total Help needed climbing 3-5 steps with a railing? : Total 6 Click Score: 8    End of Session Equipment Utilized During Treatment: Gait belt;Oxygen Activity Tolerance: Patient limited by pain;Patient limited by fatigue Patient left: in chair;with call bell/phone within reach;with chair alarm set Nurse Communication: Mobility status PT Visit Diagnosis: Other abnormalities of gait and mobility (R26.89);Unsteadiness on feet (R26.81);Muscle weakness (generalized) (M62.81);Pain Pain - part of body: (abdomen, back )     Time: 9977-4142 PT Time Calculation (min) (ACUTE ONLY): 38 min  Charges:  $Therapeutic Exercise: 8-22 mins $Therapeutic Activity: 23-37 mins                     [place] Pager:  [phone]  Office:  [place] 07/05/2018, 11:19 AM

## 2018-07-05 NOTE — Progress Notes (Signed)
[facility]) RN Visit  Notified by Hassan Rowan RN CM, of family request for [facility] services at home after discharge. Chart and patient information under review by [facility] physician and eligibility has been confirmed.  Plan is to discharge later today.  Please send signed and completed DNR for home with the patient.    Pt will need prescriptions for discharge comfort medications.  DME needs have been discussed, pt currently has the following equipment in the home:  O2, walker.  Pt will need the following:  Bed, wheelchair.    Will follow up with RN CM once DME has been ordered.  Completed discharge summary will need to be faxed to [facility] at [phone] when final.   Thank you for this referral.  Venia Carbon BSN, RN [facility] Liaison (listed in [place]) [phone]

## 2018-07-05 NOTE — Progress Notes (Signed)
Pt self administers CPAP. 

## 2018-07-05 NOTE — Progress Notes (Signed)
Nutrition Consult / follow-up  Received consult due to ongoing diarrhea. Noted stools yesterday were yellow. Yellow stool likely related to undigested fat. May need to increase Creon dose; discussed with MD. Lomotil was added and stooling has decreased today. Also receiving Florastor. Plans for d/c home with Hospice care today. Recommend continuing Osmolite 1.5 237 ml (1 can) 4 times daily with Pro-stat 30 ml once daily to provide 1520 kcal, 75 gm protein, and 724 ml free water daily.  See additional recommendations in RD note from 9/27.   Molli Barrows, RD, LDN, [place] Pager [phone] After Hours Pager [phone]

## 2018-07-05 NOTE — Progress Notes (Signed)
Preliminary notes--Bilateral upper extremities venous duplex exam completed. Positive for acute deep veins thrombosis involving bilateral brachial veins partially, superficial vein thrombosis involving right basilic vein which has mobile thrombosis at mid of right upper arm.  Hongying Shterna Laramee (RDMS RVT) 07/05/18 3:55 PM

## 2018-07-05 NOTE — Progress Notes (Addendum)
K 3.1- notified [facility] on-call

## 2018-07-05 NOTE — Discharge Summary (Signed)
Discharge Summary  Russell Williamson ASN:[id number] DOB: January 23, 1956  PCP: Harvie Junior, MD  Admit date: 06/28/2018 Discharge date: 07/05/2018  Time spent: 5mins, more than 50% time spent on coordination of care.  Recommendations for Outpatient Follow-up:  1. Patient is discharged home with home hospice  Discharge Diagnoses:  Active Hospital Problems   Diagnosis Date Noted  . Acute respiratory failure with hypoxia ([place]) 02/14/2018  . Adult failure to thrive   . Goals of care, counseling/discussion   . Palliative care by specialist   . Bilateral pleural effusion 06/29/2018  . HCAP (healthcare-associated pneumonia) 06/29/2018  . Acute metabolic encephalopathy 19/37/9024  . COPD with chronic bronchitis ([place]) 02/06/2018  . Protein-calorie malnutrition, severe 07/03/2017  . 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 Anemia in chronic illness 04/19/2015  . Relapsing chronic pancreatitis ([place]) 01/17/2015  . S/P gastrostomy ([place]) 06/17/2014  . Hypothyroidism (acquired) 11/02/2013  . Hx of radiation therapy   . History of cancer tonsil 08/20/2011    Resolved Hospital Problems  No resolved problems to display.    Discharge Condition: stable  Diet recommendation: comfort feeds Continue tube feeds, all meds per tube until full comfort measures, hospice to continue following  Filed Weights   07/03/18 0409 07/04/18 0453 07/05/18 0434  Weight: 51.2 kg 47.9 kg 47.5 kg    History of present illness: (per admitting MD Dr Hal Hope) PCP: Harvie Junior, MD  Patient coming from: Home.  Chief Complaint: Respiratory distress and altered mental status.  HPI: Russell Williamson is a 62 y.o. male with history of tonsillar cancer status post radiation, chronic recurrent pancreatitis with cachexia and severe malnutrition, hypothyroidism, anemia, hypertension, COPD chronic pain who was just recently admitted for aspiration pneumonia during which patient also underwent PEG tube placement for  dysphagia was brought to the ER after patient became confused unresponsive and respiratory distress.  EMS on arrival at home found patient to be hypoxic and was placed on nonrebreather was mildly hypotensive tachycardic and was brought to the ER.  Patient is confused and not much history was obtained from the patient.  Most of my history was obtained from the ER physician and patient's nurse and previous records.  ED Course: In the ER patient was febrile with chest x-ray showing bilateral pleural effusion and consolidation.  Patient's mental status and respiratory status improved after deep suctioning and placement of oxygen.  Patient was started on empiric antibiotics after blood cultures for pneumonia.  ABG was showing worsening hypoxia for which I placed patient on BiPAP and consult critical care.  Since patient also complained of abdominal pain and recent PEG tube placement I ordered acute abdomen with contrast which did not show any PEG tube leakage.   Hospital Course:  Principal Problem:   Acute respiratory failure with hypoxia (HCC) Active Problems:   History of cancer tonsil   Hx of radiation therapy   Hypothyroidism (acquired)   S/P gastrostomy (HCC)   Relapsing chronic pancreatitis (HCC)   Anemia in chronic illness   Megaloblastic anemia   Protein-calorie malnutrition, severe   COPD with chronic bronchitis (HCC)   Acute metabolic encephalopathy   Bilateral pleural effusion   HCAP (healthcare-associated pneumonia)   Adult failure to thrive   Goals of care, counseling/discussion   Palliative care by specialist   1. Acute respiratory failure with hypoxia and hypercarbia likely from HCAP  Possible Aspiration Pneumonia  Bilateral Pleural Effusions:  1. Vancomycin/cefepime ->negative MRSA PCR, narrowed to cefepime (Flagyl was added for concern for aspiration pna) 2.  Cultures are all negative so far.Now narrowed to augmentin. Add probiotics. 3. Per discussion with PCCM,  thought to be a poor BIPAP candidate with his chronic aspiration and mental status. Mental status has improved. Seen by palliative medicine. NowDNR. 4. Bilateral R>L effusions, s/p thoracentesis. Appears transudative. Cultures are negative so far.  Cytology with mixed inflammatory infiltrate. 5. Speech therapy consult was ordered. Currently has diet for comfort. He is getting bolustube feedings.   2. Goals of Care:seen by palliative care on 9/16 and pt desired full scope care at that time. Palliative medicine was reconsulted.  Pt is now DNR. Tentative plan was for placement to skilled nursing facility with hospice, but now pt requesting home with hospice. Continue oxy15 q4 prn. Not on bowel regimen as he has been having profuse diarrhea. Laxatives had to be discontinued. rectal tube in , diarrhea improved, hospice will take care of rectal tube at home  3. Dysphagia:  Main nutrition via peg, but will allow intermittent bites and sips of pt preferred PO for comfort (recommending good oral care followed by sips of thin water and small/several bites of puree intermittently).  4. Acute metabolic encephalopathylikely from hypoxia and hypercarbia.Improved. Likely close to baseline.  4. HypothyroidismTSH 17.789, follow free T3 (low)/T4 (wnl). Started on synthroid earlier this month, continue current dose for now.   5. Megaloblastic anemiahas received transfusion during last admission. Hemoglobin noted to be 5.6 on the morning of 9/28. No overt bleeding was noted. He was transfused 2 units of blood on 9/28.  1. Hb now stable. 9/28 value may have been erroneous. Continue to monitor for now. Normal B12, folate. Iron panel suggestive of AOCD.  6. History of recurrent pancreatitis with cachexia and malnutrition. Pain is secondary to chronic pancreatitis. Lipase was normal. Continue to adjust pain medication doses. Suspect diarrhea related to pancreatic insufficiency, creon  dose increased  7. Recent PEG tube placementfor feedings. Continue tube feedings.  8. COPD we will keep patient on nebulizers. Stable. Seen by pulmonology and they have signed off.  9. History of tonsillar cancer status post radiation.  10. Elevated troponin: mild and flat, c/w priors, no c/o chest pain. EKG appears c/w priors.   11. Hyponatremia hypokalemia  hypomagnesemia:improved.  12. Atrial Fibrillation: Newly detected on telemetry.Chadsvasc is 1 for HTN, will hold off on systemic anticoagulation at this time, home with home hospice.  13. Difficult lab draw: PICC line was ordered but could not be placed. We will not subject this patient to a central line at this time. Home with home hospice  14. Hypotension: occurred after thora, improved with IVF.  15.Acute diarrhea:Imodium has been ordered.  Negative C diff.  Has rectal tube in place at this time, on scheduled imodium, home with home hospice, rectal tube inplace.  16. Hypomagnesemia: repleted   DVT prophylaxis while in the hospital: lovenox Code Status: DNR Family Communication: niece over phone Disposition Plan: home with home hospice    Consultants:   PCCM  Palliative care  hospice  Procedures:   none  Antimicrobials:             Anti-infectives (From admission, onward)   Start     Dose/Rate Route Frequency Ordered Stop   07/02/18 1000  amoxicillin-clavulanate (AUGMENTIN) 875-125 MG per tablet 1 tablet     1 tablet Per Tube Every 12 hours 07/02/18 0837     06/30/18 1000  metroNIDAZOLE (FLAGYL) 50 mg/ml oral suspension 500 mg  Status:  Discontinued     500 mg  Per Tube 3 times daily 06/30/18 0754 07/02/18 0837   06/29/18 2200  vancomycin (VANCOCIN) 500 mg in sodium chloride 0.9 % 100 mL IVPB  Status:  Discontinued     500 mg 100 mL/hr over 60 Minutes Intravenous Every 24 hours 06/29/18 0357 06/29/18 0400   06/29/18 2200  vancomycin (VANCOCIN) IVPB 750 mg/150 ml  premix  Status:  Discontinued     750 mg 150 mL/hr over 60 Minutes Intravenous Every 24 hours 06/29/18 0400 06/30/18 0754   06/29/18 1000  ceFEPIme (MAXIPIME) 1 g in sodium chloride 0.9 % 100 mL IVPB  Status:  Discontinued     1 g 200 mL/hr over 30 Minutes Intravenous Every 12 hours 06/29/18 0357 07/02/18 0837   06/29/18 0300  vancomycin (VANCOCIN) IVPB 1000 mg/200 mL premix  Status:  Discontinued     1,000 mg 200 mL/hr over 60 Minutes Intravenous  Once 06/29/18 0248 06/29/18 0250   06/29/18 0300  ceFEPIme (MAXIPIME) 2 g in sodium chloride 0.9 % 100 mL IVPB  Status:  Discontinued     2 g 200 mL/hr over 30 Minutes Intravenous  Once 06/29/18 0248 06/29/18 0251   06/28/18 2245  vancomycin (VANCOCIN) IVPB 1000 mg/200 mL premix     1,000 mg 200 mL/hr over 60 Minutes Intravenous  Once 06/28/18 2236 06/29/18 0007   06/28/18 2245  ceFEPIme (MAXIPIME) 2 g in sodium chloride 0.9 % 100 mL IVPB     2 g 200 mL/hr over 30 Minutes Intravenous  Once 06/28/18 2236 06/28/18 2335        Discharge Exam: BP (!) 164/93   Pulse (!) 106   Temp 98.7 F (37.1 C) (Oral)   Resp 18   Ht 5\' 5"  (1.651 m)   Wt 47.5 kg   SpO2 96%   BMI 17.43 kg/m   General: frail, cachectic, NAD Cardiovascular: IRRR Respiratory: diminished at basis  Discharge Instructions You were cared for by a hospitalist during your hospital stay. If you have any questions about your discharge medications or the care you received while you were in the hospital after you are discharged, you can call the unit and asked to speak with the hospitalist on call if the hospitalist that took care of you is not available. Once you are discharged, your primary care physician will handle any further medical issues. Please note that NO REFILLS for any discharge medications will be authorized once you are discharged, as it is imperative that you return to your primary care physician (or establish a relationship with a primary care physician  if you do not have one) for your aftercare needs so that they can reassess your need for medications and monitor your lab values.   Allergies as of 07/05/2018   No Known Allergies     Medication List    STOP taking these medications   amLODipine 2.5 MG tablet Commonly known as:  NORVASC   guaiFENesin-dextromethorphan 100-10 MG/5ML syrup Commonly known as:  ROBITUSSIN DM   lipase/protease/amylase 12000 units Cpep capsule Commonly known as:  CREON Replaced by:  Pancrelipase (Lip-Prot-Amyl) 24000-76000 units Cpep   magnesium oxide 400 (241.3 Mg) MG tablet Commonly known as:  MAG-OX   megestrol 400 MG/10ML suspension Commonly known as:  MEGACE   multivitamin with minerals Tabs tablet   nicotine 21 mg/24hr patch Commonly known as:  NICODERM CQ - dosed in mg/24 hours   traMADol 50 MG tablet Commonly known as:  ULTRAM     TAKE these medications  collagenase ointment Commonly known as:  SANTYL Apply topically daily.   diphenoxylate-atropine 2.5-0.025 MG/5ML liquid Commonly known as:  LOMOTIL Take 5 mLs by mouth 4 (four) times daily as needed for diarrhea or loose stools.   feeding supplement (OSMOLITE 1.5 CAL) Liqd Place 237 mLs into feeding tube 4 (four) times daily -  with meals and at bedtime. What changed:    how to take this  when to take this   feeding supplement (PRO-STAT SUGAR FREE 64) Liqd Place 30 mLs into feeding tube daily.   folic acid 1 MG tablet Commonly known as:  FOLVITE Take 1 mg by mouth daily.   free water Soln Place 250 mLs into feeding tube every 8 (eight) hours.   levothyroxine 100 MCG tablet Commonly known as:  SYNTHROID, LEVOTHROID Place 1 tablet (100 mcg total) into feeding tube daily before breakfast.   mirtazapine 30 MG tablet Commonly known as:  REMERON Place 1 tablet (30 mg total) into feeding tube at bedtime. What changed:  how to take this   oxyCODONE 15 MG immediate release tablet Commonly known as:  ROXICODONE Place 1  tablet (15 mg total) into feeding tube every 4 (four) hours as needed for up to 7 days for moderate pain. What changed:    how to take this  when to take this  reasons to take this   OXYGEN Inhale 3 L into the lungs as needed (shortness of breath).   Pancrelipase (Lip-Prot-Amyl) 24000-76000 units Cpep Take 1 capsule (24,000 Units total) by mouth 4 (four) times daily -  with meals and at bedtime. Replaces:  lipase/protease/amylase 12000 units Cpep capsule   pantoprazole sodium 40 mg/20 mL Pack Commonly known as:  PROTONIX Place 10 mLs (20 mg total) into feeding tube 2 (two) times daily.   phosphorus 155-852-130 MG tablet Commonly known as:  K PHOS NEUTRAL Place 2 tablets (500 mg total) into feeding tube daily. Start taking on:  07/06/2018 What changed:  when to take this   PROAIR HFA 108 (90 Base) MCG/ACT inhaler Generic drug:  albuterol Inhale 2 puffs into the lungs every 4 (four) hours as needed for wheezing or shortness of breath.   promethazine 25 MG tablet Commonly known as:  PHENERGAN Take 1 tablet (25 mg total) by mouth every 6 (six) hours as needed for nausea or vomiting.   [place] Add to liquids to make nectar thick consistency for dysphagia   saccharomyces boulardii 250 MG capsule Commonly known as:  FLORASTOR Place 1 capsule (250 mg total) into feeding tube 2 (two) times daily.   sucralfate 1 GM/10ML suspension Commonly known as:  CARAFATE Place 10 mLs (1 g total) into feeding tube 4 (four) times daily -  with meals and at bedtime.      No Known Allergies Follow-up Information    [facility] Follow up.   Why:  Registered Nurse and Aide.  Contact information: [place] [address] [phone]       Harvie Junior, MD Follow up.   Specialty:  Family Medicine Why:  as needed Contact information: [place] [address] [phone]             The results of significant diagnostics from this hospitalization (including imaging, microbiology, ancillary and laboratory) are listed below for reference.    Significant Diagnostic Studies: Ir Gastrostomy Tube Mod Sed  Result Date: 06/19/2018 INDICATION: 62 year old male with failure to thrive. Request  for percutaneous gastrostomy tube placement EXAM: PERCUTANEOUS GASTROSTOMY TUBE WITH FLUOROSCOPIC GUIDANCE Physician: Stephan Minister. Anselm Pancoast, MD MEDICATIONS: Inpatient receiving scheduled IV antibiotics.  Glucagon 1 mg IV ANESTHESIA/SEDATION: Versed 1 mg IV; Fentanyl 25 mcg IV Moderate Sedation Time:  26 minutes The patient was continuously monitored during the procedure by the interventional radiology nurse under my direct supervision. FLUOROSCOPY TIME:  Fluoroscopy Time: 6 minutes and 42 seconds, 14 mGy COMPLICATIONS: None immediate. PROCEDURE: Informed consent was obtained for a percutaneous gastrostomy tube. The patient was placed on the interventional table. An orogastric tube was placed with fluoroscopic guidance. The anterior abdomen was prepped and draped in sterile fashion. Maximal barrier sterile technique was utilized including caps, mask, sterile gowns, sterile gloves, sterile drape, hand hygiene and skin antiseptic. Stomach was inflated with air through the orogastric tube. The skin and subcutaneous tissues were anesthetized with 1% lidocaine. A 17 gauge needle was directed into the distended stomach with fluoroscopic guidance. Needle placement was difficult due to the shape of the stomach. Placement within the stomach was confirmed using a lateral projection and contrast injection. A wire was advanced into the stomach. A 9-French vascular sheath was placed and the snare device went directly into the esophagus and advanced to the patient's mouth. This snare device was pulled out of the patient's mouth. The snare device was connected to a 20-French gastrostomy tube. The snare device and gastrostomy  tube were pulled through the patient's mouth and out the anterior abdominal wall. The gastrostomy tube was cut to an appropriate length. Contrast injection through gastrostomy tube confirmed placement within the stomach. Fluoroscopic images were obtained for documentation. The gastrostomy tube was flushed with normal saline. IMPRESSION: Successful fluoroscopic guided percutaneous gastrostomy tube placement. Electronically Signed   By: Markus Daft M.D.   On: 06/19/2018 11:42   Dg Abdomen Peg Tube Location  Result Date: 06/29/2018 CLINICAL DATA:  Initial evaluation for PEG tube placement EXAM: ABDOMEN - 1 VIEW COMPARISON:  Prior radiograph from 06/22/2018 FINDINGS: Contrast material has been instilled via a percutaneous gastrostomy tube. Contrast seen filling the gastric body and fundus. No extraluminal contrast to suggest leak or malpositioning. Nonobstructive bowel gas pattern. Large amount of retained stool within the colon, suggesting constipation. Cholecystectomy clips noted. Right greater than left bilateral pleural effusions with associated bibasilar opacities partially visualized at the lung bases. Degenerative changes noted within the lower lumbar spine. IMPRESSION: 1. Percutaneous gastrostomy tube in place without complication. No extraluminal contrast to suggest malpositioning or leak. 2. Large volume retained stool within the colon, suggesting constipation. 3. Bilateral right greater than left pleural effusions with associated bibasilar atelectasis and/or consolidation. Electronically Signed   By: Jeannine Boga M.D.   On: 06/29/2018 02:29   Dg Chest Port 1 View  Result Date: 06/29/2018 CLINICAL DATA:  Follow-up thoracentesis EXAM: PORTABLE CHEST 1 VIEW COMPARISON:  06/28/2018 FINDINGS: Reduction in right-sided pleural effusion is noted following thoracentesis. No pneumothorax is noted. Small persistent left pleural effusion is noted. Mild bibasilar atelectatic changes are noted. Cardiac  shadow is stable. IMPRESSION: No evidence of pneumothorax following right-sided thoracentesis. Bibasilar atelectasis. Electronically Signed   By: Inez Catalina M.D.   On: 06/29/2018 15:13   Dg Chest Port 1 View  Result Date: 06/28/2018 CLINICAL DATA:  Unresponsive and hypoxic. EXAM: PORTABLE CHEST 1 VIEW COMPARISON:  06/26/2018 FINDINGS: Mild cardiac enlargement. Streaky parenchymal opacities at the left lung base with air bronchograms may reflect areas of atelectasis and/or pneumonia. Moderate right and small left pleural effusions are redemonstrated  slightly increased on the left. Emphysematous hyperinflation of the lungs, upper lobe predominant. No acute osseous abnormality. IMPRESSION: Moderate right and small left pleural effusions, slightly increased on the left and stable on the right since prior. Subsegmental atelectasis and/or pneumonia at the left lung base. Emphysematous hyperinflation of the lungs bilaterally. Electronically Signed   By: Ashley Royalty M.D.   On: 06/28/2018 22:44   Dg Chest Port 1 View  Result Date: 06/26/2018 CLINICAL DATA:  Dyspnea COPD (chronic obstructive pulmonary disease,cough,congestion EXAM: PORTABLE CHEST 1 VIEW COMPARISON:  06/21/2018 FINDINGS: Lungs are hyperinflated. There is persistent opacity in the MEDIAL LEFT lung base and at the RIGHT lung base. Opacity on the RIGHT obscures the hemidiaphragm. There are bilateral pleural effusions, RIGHT greater than LEFT. Effusion appears increased on the RIGHT since previous exam. IMPRESSION: Increased RIGHT pleural effusion.  Persistent bibasilar opacities. Electronically Signed   By: Nolon Nations M.D.   On: 06/26/2018 13:01   Dg Chest Port 1 View  Result Date: 06/21/2018 CLINICAL DATA:  Cough, COPD, follow-up pneumonia EXAM: PORTABLE CHEST 1 VIEW COMPARISON:  06/09/2018 chest radiograph. FINDINGS: Stable cardiomediastinal silhouette with normal heart size. No pneumothorax. Small to moderate right pleural effusion,  increased. Stable trace left pleural effusion. Emphysema. No pulmonary edema. Patchy opacities at the right greater than left lung bases, worsened on the right and stable on the left. IMPRESSION: 1. Small to moderate right pleural effusion, increased. Trace left pleural effusion, stable. 2. Patchy opacities at the right greater than left lung bases, worsened on the right and stable on the left, which could represent atelectasis, aspiration or pneumonia. Follow-up chest radiographs to resolution advised. 3. Emphysema. Electronically Signed   By: Ilona Sorrel M.D.   On: 06/21/2018 10:17   Dg Chest Port 1 View  Result Date: 06/09/2018 CLINICAL DATA:  Found unconscious. Hypoglycemic. Altered mental status. EXAM: PORTABLE CHEST 1 VIEW COMPARISON:  Two-view chest x-ray 05/26/2018 FINDINGS: Heart size normal. Asymmetric right lower lobe airspace disease is present. There is minimal atelectasis at the left base. A lateral right pleural effusion is noted. IMPRESSION: 1. Asymmetric right lower lobe airspace disease and effusion consistent with lobar pneumonia. Aspiration is considered less likely. 2. Minimal airspace disease the left base likely reflects atelectasis. Electronically Signed   By: San Morelle M.D.   On: 06/09/2018 17:14   Dg Abd 2 Views  Result Date: 06/22/2018 CLINICAL DATA:  Abdominal distension EXAM: ABDOMEN - 2 VIEW COMPARISON:  CT abdomen and pelvis May 30, 2018. FINDINGS: Supine and upright images obtained. Gastrostomy catheter is positioned in the left upper abdomen. There is no appreciable bowel dilatation or air-fluid level to suggest bowel obstruction. No free air. There are surgical clips in the gallbladder fossa region. There is a right pleural effusion with right base consolidation. There are foci of pancreatic calcification evident. IMPRESSION: Gastrostomy catheter on the left. Evidence suggesting a degree of chronic pancreatitis. No bowel obstruction or free air evident. Right  pleural effusion with right base consolidation noted. Electronically Signed   By: Lowella Grip III M.D.   On: 06/22/2018 15:29   [place] Ekg Site Rite  Result Date: 06/30/2018 If Site Rite image not attached, placement could not be confirmed due to current cardiac rhythm.  Ir Thoracentesis Asp Pleural Space W/img Guide  Result Date: 06/29/2018 INDICATION: Patient with history of tonsillar cancer, recurrent pancreatitis, malnutrition, COPD, respiratory failure/pneumonia, bilateral pleural effusions right greater than left. Request made for diagnostic and therapeutic right thoracentesis. EXAM: ULTRASOUND GUIDED DIAGNOSTIC  AND THERAPEUTIC RIGHT THORACENTESIS MEDICATIONS: None COMPLICATIONS: None immediate. PROCEDURE: An ultrasound guided thoracentesis was thoroughly discussed with the patient and questions answered. The benefits, risks, alternatives and complications were also discussed. The patient understands and wishes to proceed with the procedure. Written consent was obtained. Ultrasound was performed to localize and mark an adequate pocket of fluid in the right chest. The area was then prepped and draped in the normal sterile fashion. 1% Lidocaine was used for local anesthesia. Under ultrasound guidance a 6 Fr Safe-T-Centesis catheter was introduced. Thoracentesis was performed. The catheter was removed and a dressing applied. FINDINGS: A total of approximately 1.5 liters of slightly hazy, yellow fluid was removed. Samples were sent to the laboratory as requested by the clinical team. IMPRESSION: Successful ultrasound guided diagnostic and therapeutic right thoracentesis yielding 1.5 liters of pleural fluid. Read by: Rowe Robert, PA-C Electronically Signed   By: Sandi Mariscal M.D.   On: 06/29/2018 12:16    Microbiology: Recent Results (from the past 240 hour(s))  Blood Culture (routine x 2)     Status: None   Collection Time: 06/28/18 10:25 PM  Result Value Ref Range Status   Specimen Description  BLOOD RIGHT ANTECUBITAL  Final   Special Requests   Final    BOTTLES DRAWN AEROBIC AND ANAEROBIC Blood Culture results may not be optimal due to an inadequate volume of blood received in culture bottles   Culture   Final    NO GROWTH 5 DAYS Performed at [facility], [place] [address], [place], [place] [address]    Report Status 07/03/2018 FINAL  Final  Urine culture     Status: None   Collection Time: 06/28/18 10:43 PM  Result Value Ref Range Status   Specimen Description URINE, RANDOM  Final   Special Requests NONE  Final   Culture   Final    NO GROWTH Performed at [facility], [place] [address] [place], [place] [address]    Report Status 06/30/2018 FINAL  Final  Blood Culture (routine x 2)     Status: None   Collection Time: 06/28/18 10:45 PM  Result Value Ref Range Status   Specimen Description SITE NOT SPECIFIED  Final   Special Requests   Final    BOTTLES DRAWN AEROBIC AND ANAEROBIC Blood Culture adequate volume   Culture   Final    NO GROWTH 5 DAYS Performed at [facility], [address], [place], [place] [address]    Report Status 07/03/2018 FINAL  Final  MRSA PCR Screening     Status: None   Collection Time: 06/29/18  9:14 AM  Result Value Ref Range Status   MRSA by PCR NEGATIVE NEGATIVE Final    Comment:        The GeneXpert MRSA Assay (FDA approved for NASAL specimens only), is one component of a comprehensive MRSA colonization surveillance program. It is not intended to diagnose MRSA infection nor to guide or monitor treatment for MRSA infections. Performed at [facility], [place] [address], [place], [place] [address]   Gram stain     Status: None   Collection Time: 06/29/18 12:27 PM  Result Value Ref Range Status   Specimen Description PLEURAL RIGHT  Final   Special Requests NONE  Final   Gram Stain   Final    MODERATE WBC PRESENT,BOTH PMN AND MONONUCLEAR NO ORGANISMS SEEN Performed at [facility], [address]  [place], [place] [address]  Report Status 06/29/2018 FINAL  Final  Culture, body fluid-bottle     Status: None   Collection Time: 06/29/18 12:27 PM  Result Value Ref Range Status   Specimen Description PLEURAL RIGHT  Final   Special Requests NONE  Final   Culture   Final    NO GROWTH 5 DAYS Performed at [facility], [address], [place], [place] [address]    Report Status 07/04/2018 FINAL  Final  Expectorated sputum assessment w rflx to resp cult     Status: None   Collection Time: 06/29/18  5:20 PM  Result Value Ref Range Status   Specimen Description EXPECTORATED SPUTUM  Final   Special Requests NONE  Final   Sputum evaluation   Final    Sputum specimen not acceptable for testing.  Please recollect.   RESULT CALLED TO, READ BACK BY AND VERIFIED WITH: S FLETCHER 06/29/18  2105 JDW Performed at [facility], [place] [address], [place], [place] [address]    Report Status 06/30/2018 FINAL  Final  C difficile quick scan w PCR reflex     Status: None   Collection Time: 07/03/18  3:59 PM  Result Value Ref Range Status   C Diff antigen NEGATIVE NEGATIVE Final   C Diff toxin NEGATIVE NEGATIVE Final   C Diff interpretation No C. difficile detected.  Final    Comment: Performed at [facility], [place] [address], [place],  [address]     Labs: Basic Metabolic Panel: Recent Labs  Lab 07/01/18 0636 07/02/18 0351 07/03/18 0343 07/04/18 1103 07/05/18 0406  NA 131* 134* 139 139 137  K 2.8* 3.0* 3.7 3.5 3.1*  CL 91* 93* 98 95* 98  CO2 32 33* 35* 34* 35*  GLUCOSE 81 97 91 83 130*  BUN 8 12 10 12 13   CREATININE <0.30* 0.31* 0.34* 0.39* 0.37*  CALCIUM 6.6* 6.8* 7.5* 7.6* 7.1*  MG 1.0* 1.5* 1.7 1.3*  --   PHOS 3.1  --   --   --   --    Liver Function Tests: Recent Labs  Lab 06/28/18 2225 06/29/18 0314  AST 28 27  ALT 26 29  ALKPHOS 86 93  BILITOT 0.7 0.4  PROT 5.5* 5.9*  ALBUMIN 1.8* 2.0*   Recent Labs  Lab 06/29/18 1118  LIPASE 18   No results for  input(s): AMMONIA in the last 168 hours. CBC: Recent Labs  Lab 06/28/18 2225  06/29/18 0314 07/01/18 0636 07/01/18 1904 07/02/18 0351 07/03/18 0343 07/04/18 1235 07/05/18 0406  WBC 11.5*  --  13.4* 8.7  --  9.9 9.1 9.0 8.2  NEUTROABS 10.1*  --  11.5*  --   --   --   --   --   --   HGB 7.8*   < > 7.4* 5.6* 10.4* 10.3* 9.9* 11.1* 10.1*  HCT 24.2*   < > 23.4* 16.3* 30.6* 30.1* 29.4* 33.8* 31.2*  MCV 107.1*  --  106.8* 101.9*  --  97.1 100.0 103.0* 104.3*  PLT 354  --  381 261  --  305 291 310 280   < > = values in this interval not displayed.   Cardiac Enzymes: Recent Labs  Lab 06/28/18 2225 06/29/18 0314 06/29/18 1118  TROPONINI 0.05* 0.06* 0.04*   BNP: BNP (last 3 results) Recent Labs    02/14/18 0353 06/28/18 2225  BNP 495.7* 2,215.5*    ProBNP (last 3 results) No results for input(s): PROBNP in the  last 8760 hours.  CBG: No results for input(s): GLUCAP in the last 168 hours.     Signed:  Florencia Reasons MD, PhD  [facility] 07/05/2018, 2:41 PM

## 2018-08-04 DEATH — deceased

## 2019-02-21 IMAGING — DX DG CHEST 1V PORT
1 series · 1 of 1 positions shown · non-contrast
Comparison: 06/09/2018 chest radiograph.

CLINICAL DATA: Cough, COPD, follow-up pneumonia

EXAM:
PORTABLE CHEST 1 VIEW

[chest ap]
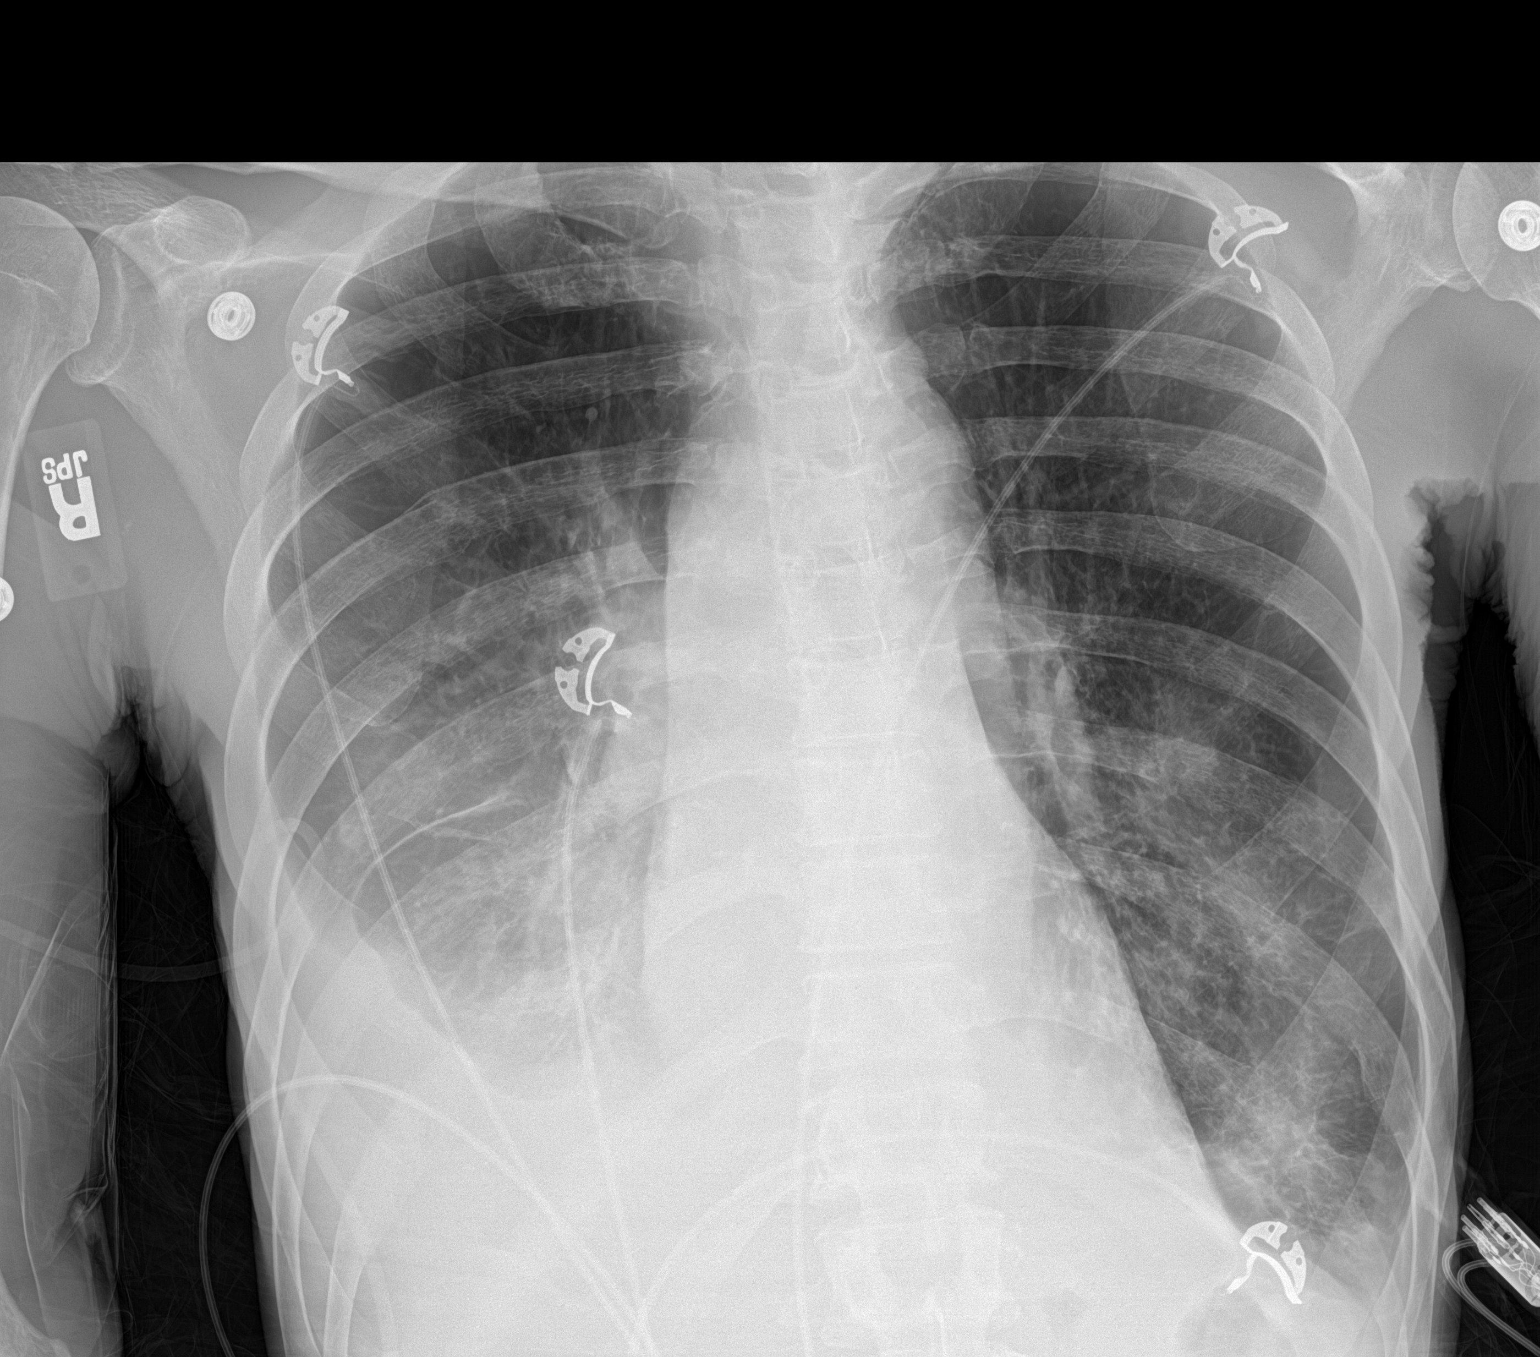

[1 of 1 positions shown; findings below may reference images not displayed]

FINDINGS: Stable cardiomediastinal silhouette with normal heart size. No
pneumothorax. Small to moderate right pleural effusion, increased.
Stable trace left pleural effusion. Emphysema. No pulmonary edema.
Patchy opacities at the right greater than left lung bases, worsened
on the right and stable on the left.
IMPRESSION: 1. Small to moderate right pleural effusion, increased. Trace left
pleural effusion, stable.
2. Patchy opacities at the right greater than left lung bases,
worsened on the right and stable on the left, which could represent
atelectasis, aspiration or pneumonia. Follow-up chest radiographs to
resolution advised.
3. Emphysema.

## 2019-02-22 IMAGING — CR DG ABDOMEN 2V
1 series · 1 of 1 positions shown · non-contrast
Comparison: CT abdomen and pelvis May 30, 2018.

CLINICAL DATA: Abdominal distension

EXAM:
ABDOMEN - 2 VIEW

[abdomen erect]
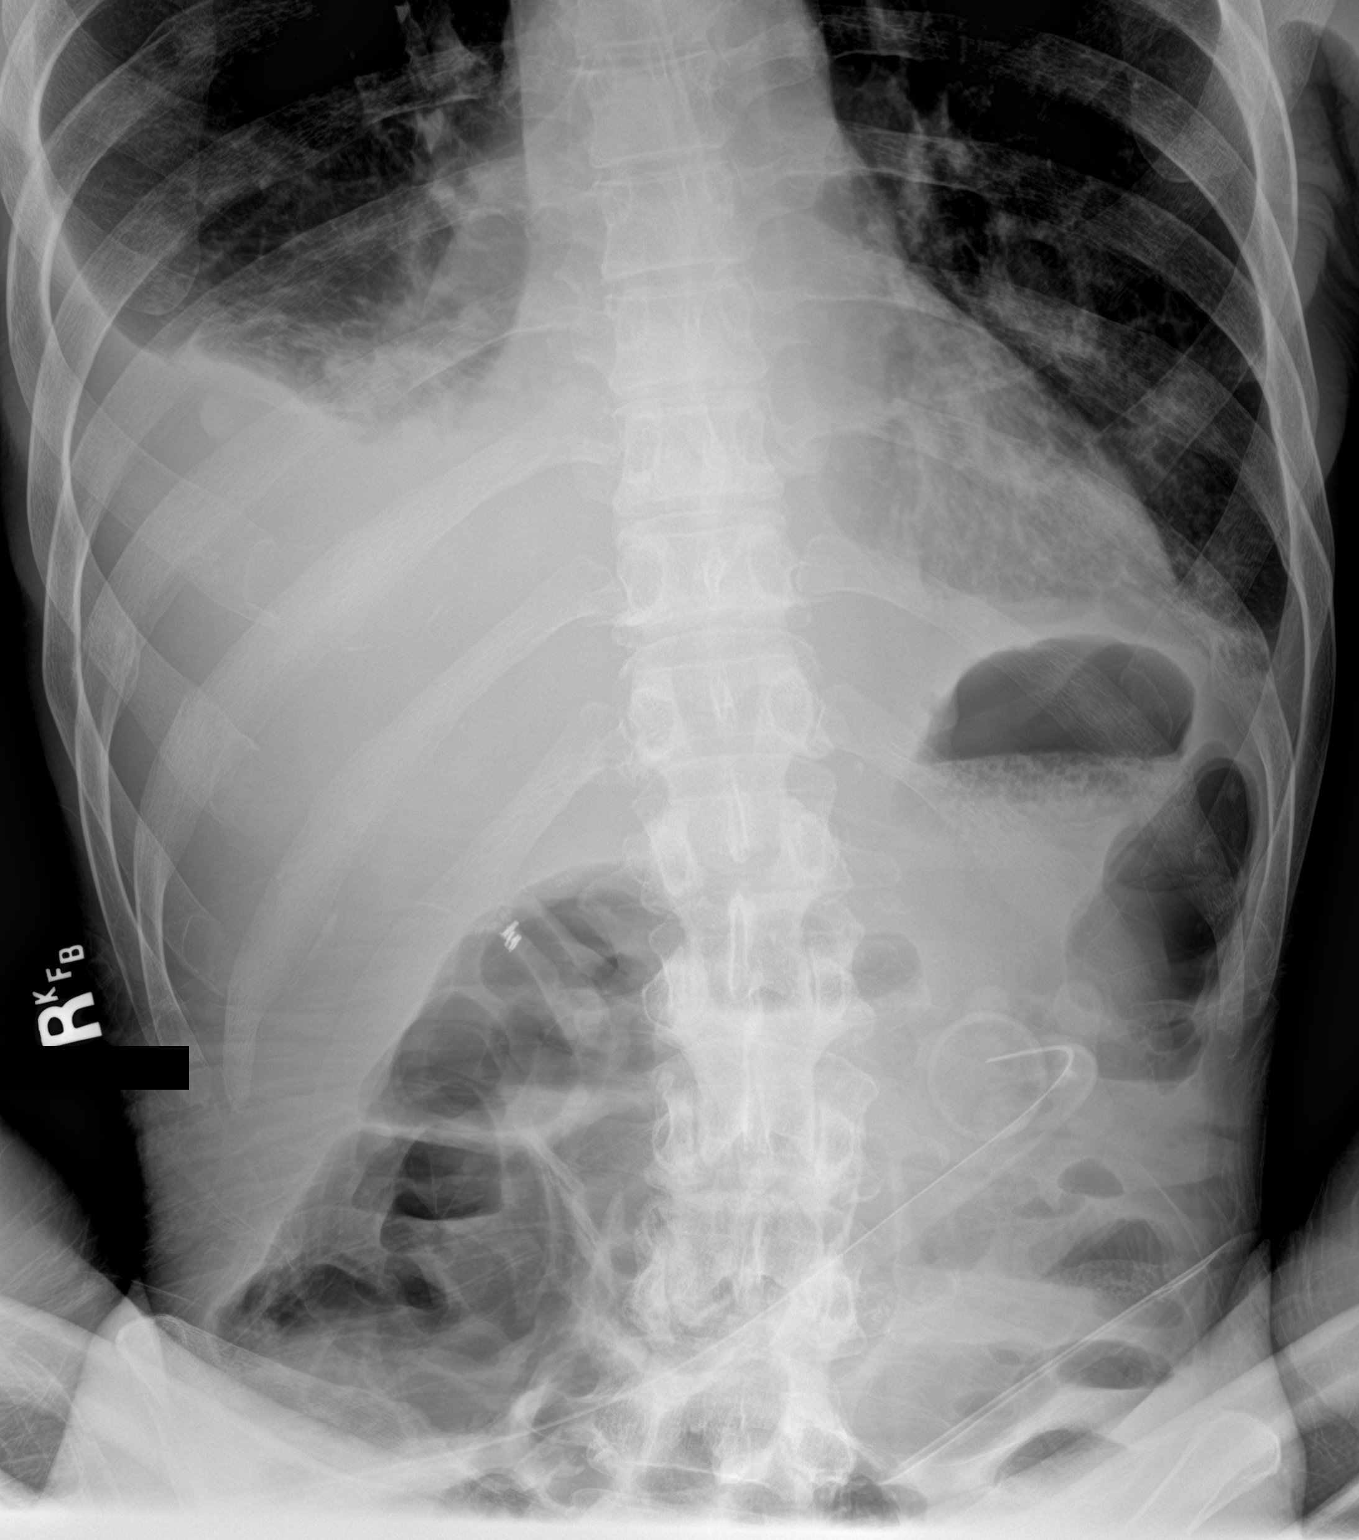

[1 of 1 positions shown; findings below may reference images not displayed]

FINDINGS: Supine and upright images obtained. Gastrostomy catheter is
positioned in the left upper abdomen. There is no appreciable bowel
dilatation or air-fluid level to suggest bowel obstruction. No free
air. There are surgical clips in the gallbladder fossa region. There
is a right pleural effusion with right base consolidation. There are
foci of pancreatic calcification evident.
IMPRESSION: Gastrostomy catheter on the left. Evidence suggesting a degree of
chronic pancreatitis. No bowel obstruction or free air evident.
Right pleural effusion with right base consolidation noted.

## 2019-02-26 IMAGING — DX DG CHEST 1V PORT
1 series · 1 of 1 positions shown · non-contrast
Comparison: 06/21/2018

CLINICAL DATA: Dyspnea COPD (chronic obstructive pulmonary
disease,cough,congestion

EXAM:
PORTABLE CHEST 1 VIEW

[chest]
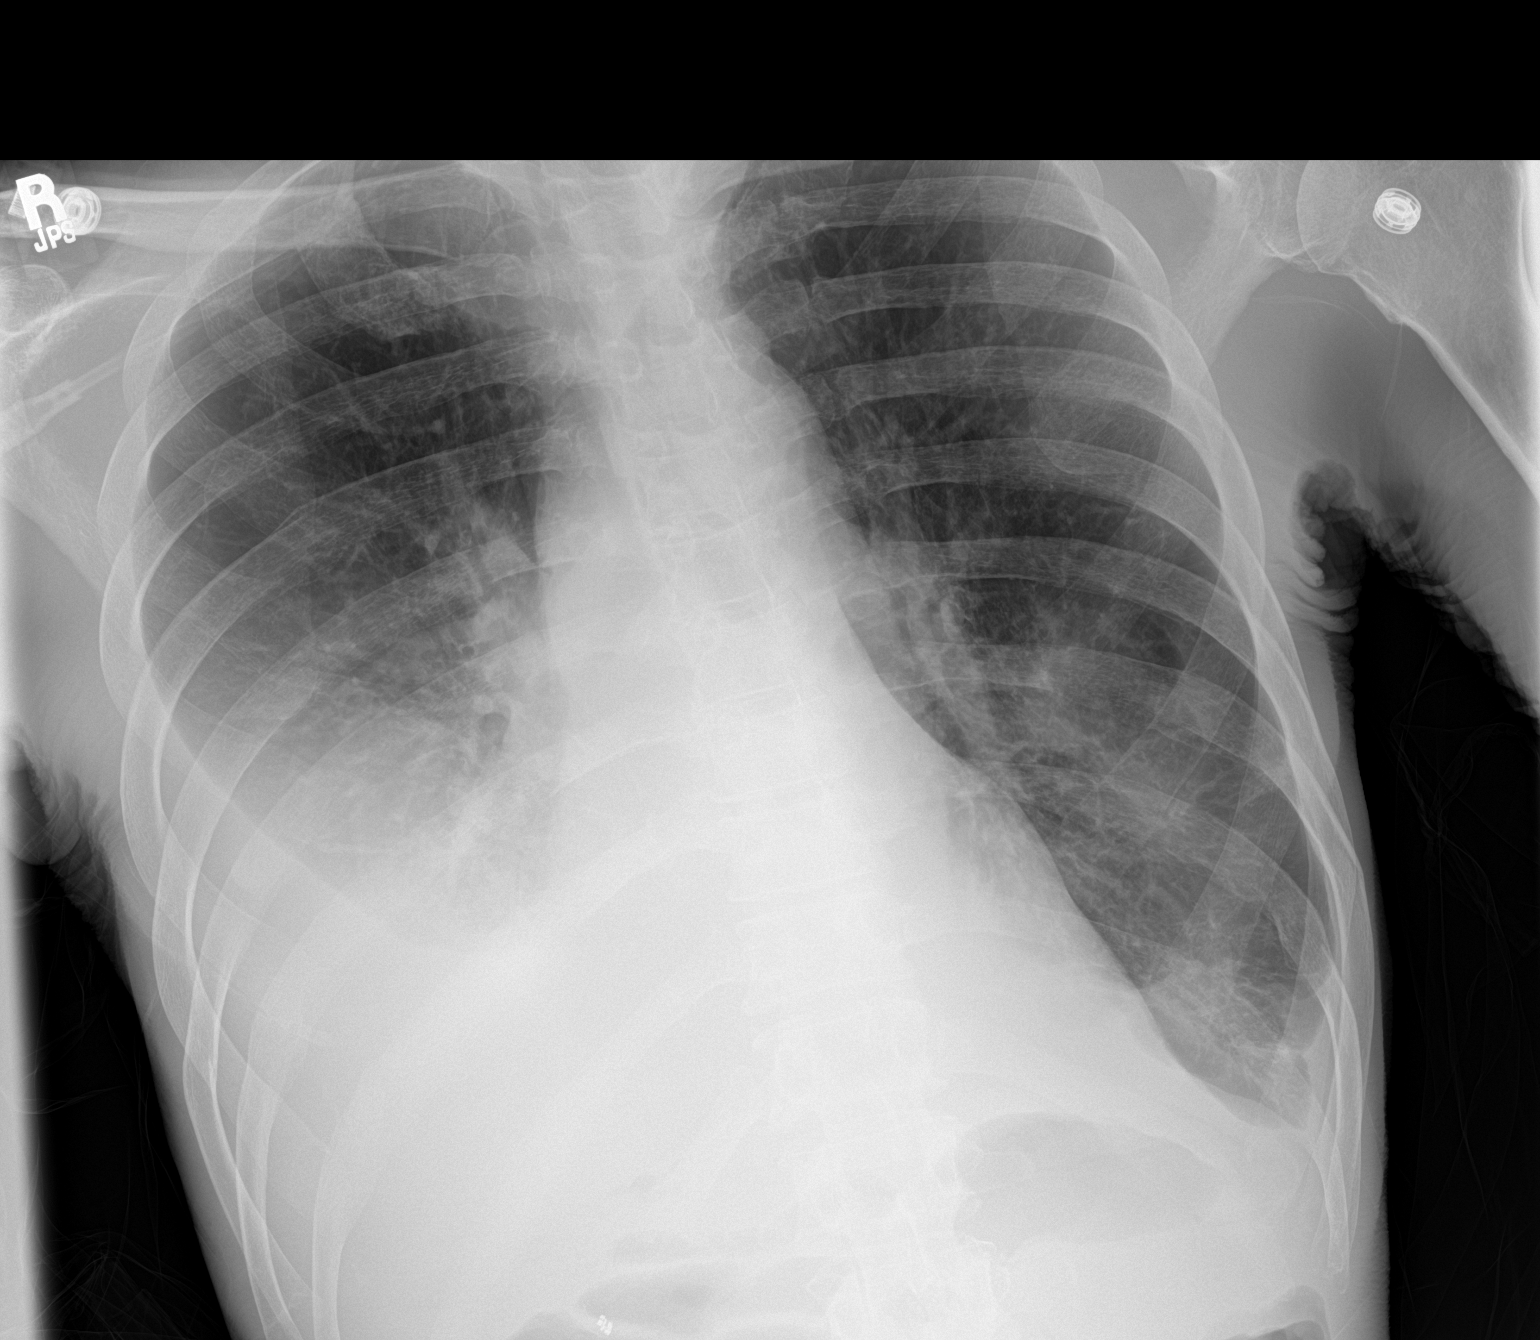

[1 of 1 positions shown; findings below may reference images not displayed]

FINDINGS: Lungs are hyperinflated. There is persistent opacity in the MEDIAL
LEFT lung base and at the RIGHT lung base. Opacity on the RIGHT
obscures the hemidiaphragm. There are bilateral pleural effusions,
RIGHT greater than LEFT. Effusion appears increased on the RIGHT
since previous exam.
IMPRESSION: Increased RIGHT pleural effusion.  Persistent bibasilar opacities.

## 2023-08-24 NOTE — Telephone Encounter (Signed)
Telephone call
